# Patient Record
Sex: Female | Born: 1970 | Race: Black or African American | Hispanic: No | Marital: Married | Smoking: Current every day smoker
Health system: Southern US, Community
[De-identification: ages and names within clinical notes are randomized; demographics above are authoritative.]

## PROBLEM LIST (undated history)

## (undated) DIAGNOSIS — I509 Heart failure, unspecified: Secondary | ICD-10-CM

## (undated) DIAGNOSIS — F329 Major depressive disorder, single episode, unspecified: Secondary | ICD-10-CM

## (undated) DIAGNOSIS — J449 Chronic obstructive pulmonary disease, unspecified: Secondary | ICD-10-CM

## (undated) DIAGNOSIS — R51 Headache: Secondary | ICD-10-CM

## (undated) DIAGNOSIS — C801 Malignant (primary) neoplasm, unspecified: Secondary | ICD-10-CM

## (undated) DIAGNOSIS — D649 Anemia, unspecified: Secondary | ICD-10-CM

## (undated) DIAGNOSIS — I10 Essential (primary) hypertension: Secondary | ICD-10-CM

## (undated) DIAGNOSIS — J40 Bronchitis, not specified as acute or chronic: Secondary | ICD-10-CM

## (undated) DIAGNOSIS — R0600 Dyspnea, unspecified: Secondary | ICD-10-CM

## (undated) DIAGNOSIS — R519 Headache, unspecified: Secondary | ICD-10-CM

## (undated) DIAGNOSIS — F32A Depression, unspecified: Secondary | ICD-10-CM

## (undated) DIAGNOSIS — K219 Gastro-esophageal reflux disease without esophagitis: Secondary | ICD-10-CM

## (undated) HISTORY — DX: Major depressive disorder, single episode, unspecified: F32.9

## (undated) HISTORY — PX: BREAST SURGERY: SHX581

## (undated) HISTORY — PX: LEG SURGERY: SHX1003

## (undated) HISTORY — DX: Anemia, unspecified: D64.9

## (undated) HISTORY — DX: Depression, unspecified: F32.A

## (undated) HISTORY — DX: Gastro-esophageal reflux disease without esophagitis: K21.9

---

## 1999-07-26 ENCOUNTER — Inpatient Hospital Stay (HOSPITAL_COMMUNITY): Admission: AD | Admit: 1999-07-26 | Discharge: 1999-07-29 | Payer: Self-pay

## 2001-07-20 ENCOUNTER — Emergency Department (HOSPITAL_COMMUNITY): Admission: EM | Admit: 2001-07-20 | Discharge: 2001-07-20 | Payer: Self-pay | Admitting: Emergency Medicine

## 2001-07-20 ENCOUNTER — Encounter: Payer: Self-pay | Admitting: Emergency Medicine

## 2002-04-15 ENCOUNTER — Encounter: Payer: Self-pay | Admitting: Emergency Medicine

## 2002-04-15 ENCOUNTER — Emergency Department (HOSPITAL_COMMUNITY): Admission: EM | Admit: 2002-04-15 | Discharge: 2002-04-15 | Payer: Self-pay | Admitting: Emergency Medicine

## 2002-08-22 ENCOUNTER — Emergency Department (HOSPITAL_COMMUNITY): Admission: EM | Admit: 2002-08-22 | Discharge: 2002-08-22 | Payer: Self-pay | Admitting: *Deleted

## 2002-08-28 ENCOUNTER — Inpatient Hospital Stay (HOSPITAL_COMMUNITY): Admission: EM | Admit: 2002-08-28 | Discharge: 2002-08-31 | Payer: Self-pay | Admitting: Emergency Medicine

## 2002-08-28 ENCOUNTER — Encounter: Payer: Self-pay | Admitting: Emergency Medicine

## 2002-08-28 ENCOUNTER — Encounter: Payer: Self-pay | Admitting: Orthopaedic Surgery

## 2002-08-29 ENCOUNTER — Encounter: Payer: Self-pay | Admitting: Orthopaedic Surgery

## 2003-02-06 ENCOUNTER — Emergency Department (HOSPITAL_COMMUNITY): Admission: EM | Admit: 2003-02-06 | Discharge: 2003-02-06 | Payer: Self-pay | Admitting: *Deleted

## 2003-02-06 ENCOUNTER — Encounter: Payer: Self-pay | Admitting: *Deleted

## 2003-02-19 ENCOUNTER — Emergency Department (HOSPITAL_COMMUNITY): Admission: EM | Admit: 2003-02-19 | Discharge: 2003-02-19 | Payer: Self-pay | Admitting: Emergency Medicine

## 2004-03-15 ENCOUNTER — Emergency Department (HOSPITAL_COMMUNITY): Admission: EM | Admit: 2004-03-15 | Discharge: 2004-03-15 | Payer: Self-pay | Admitting: Emergency Medicine

## 2004-03-27 ENCOUNTER — Emergency Department (HOSPITAL_COMMUNITY): Admission: EM | Admit: 2004-03-27 | Discharge: 2004-03-27 | Payer: Self-pay | Admitting: Emergency Medicine

## 2004-10-16 ENCOUNTER — Emergency Department (HOSPITAL_COMMUNITY): Admission: EM | Admit: 2004-10-16 | Discharge: 2004-10-16 | Payer: Self-pay | Admitting: Emergency Medicine

## 2004-12-01 ENCOUNTER — Emergency Department (HOSPITAL_COMMUNITY): Admission: EM | Admit: 2004-12-01 | Discharge: 2004-12-01 | Payer: Self-pay | Admitting: Emergency Medicine

## 2006-01-03 ENCOUNTER — Emergency Department (HOSPITAL_COMMUNITY): Admission: EM | Admit: 2006-01-03 | Discharge: 2006-01-03 | Payer: Self-pay | Admitting: Emergency Medicine

## 2006-01-23 ENCOUNTER — Emergency Department (HOSPITAL_COMMUNITY): Admission: EM | Admit: 2006-01-23 | Discharge: 2006-01-23 | Payer: Self-pay | Admitting: Emergency Medicine

## 2006-02-07 ENCOUNTER — Emergency Department (HOSPITAL_COMMUNITY): Admission: EM | Admit: 2006-02-07 | Discharge: 2006-02-07 | Payer: Self-pay | Admitting: Emergency Medicine

## 2008-11-21 ENCOUNTER — Emergency Department (HOSPITAL_COMMUNITY): Admission: EM | Admit: 2008-11-21 | Discharge: 2008-11-21 | Payer: Self-pay | Admitting: Emergency Medicine

## 2009-05-27 ENCOUNTER — Emergency Department (HOSPITAL_COMMUNITY): Admission: EM | Admit: 2009-05-27 | Discharge: 2009-05-27 | Payer: Self-pay | Admitting: Emergency Medicine

## 2009-06-11 ENCOUNTER — Emergency Department (HOSPITAL_COMMUNITY): Admission: EM | Admit: 2009-06-11 | Discharge: 2009-06-12 | Payer: Self-pay | Admitting: Emergency Medicine

## 2009-07-28 ENCOUNTER — Emergency Department (HOSPITAL_COMMUNITY): Admission: EM | Admit: 2009-07-28 | Discharge: 2009-07-28 | Payer: Self-pay | Admitting: Emergency Medicine

## 2009-09-06 ENCOUNTER — Emergency Department (HOSPITAL_COMMUNITY): Admission: EM | Admit: 2009-09-06 | Discharge: 2009-09-06 | Payer: Self-pay | Admitting: Emergency Medicine

## 2010-09-22 ENCOUNTER — Encounter: Payer: Self-pay | Admitting: Emergency Medicine

## 2010-12-12 LAB — RAPID STREP SCREEN (MED CTR MEBANE ONLY): Streptococcus, Group A Screen (Direct): NEGATIVE

## 2011-01-17 NOTE — Discharge Summary (Signed)
   NAME:  Kristin Carson, Kristin Carson                            ACCOUNT NO.:  [id number]   MEDICAL RECORD NO.:  [id number]                   PATIENT TYPE:  INP   LOCATION:  A338                                 FACILITY:  [facility]   PHYSICIAN:  J. Darreld Mclean, M.D.              DATE OF BIRTH:  May 10, 1971   DATE OF ADMISSION:  08/28/2002  DATE OF DISCHARGE:  08/31/2002                                 DISCHARGE SUMMARY   DISCHARGE DIAGNOSIS:  Bimalleolar fracture with the left ankle displaced.   PROCEDURE:  Open treatment and internal fixation of right bimalleolar  fracture.   DISCHARGE MEDICATIONS:  1. Tylox.  2. Keflex 500.   FOLLOW UP:  The patient will be seen in my office on January 12 at 11  o'clock.  The patient is to use crutches or walker, keep her leg off the  ground.  Call me if any difficulty.   BRIEF HISTORY:  The patient fell and sustained the above-mentioned injury  the night before surgery.  She was seen and evaluated.  Risks and  imponderables were discussed.  She underwent the procedure and tolerated it  well.  First day postoperatively, she had a temperature up to 101 but her  vital signs were stable.  Physical therapy was begun.  Respiratory therapy  was begun.  Pain was controlled with a PCA pump.  The patient did very well  with physical therapy.  At that time of discharge, on New Year's Eve, the  patient was afebrile, walking well without any difficulty.  Prescription for  Tylox for pain.  See her in the office as stated.  If any difficulties, she  is to contact the office or hospital beeper system.   LABORATORY DATA:  The patient's lab were within normal limits.  Other  studies were negative.                                               Teola Bradley, M.D.    JWK/MEDQ  D:  10/11/2002  T:  10/11/2002  Job:  [id number]

## 2011-01-17 NOTE — Op Note (Signed)
NAME:  Kristin Carson, Kristin Carson                            ACCOUNT NO.:  [id number]   MEDICAL RECORD NO.:  [id number]                   PATIENT TYPE:  INP   LOCATION:  A338                                 FACILITY:  [facility]   PHYSICIAN:  J. Darreld Mclean, M.D.              DATE OF BIRTH:  1971/02/17   DATE OF PROCEDURE:  DATE OF DISCHARGE:                                 OPERATIVE REPORT   PREOPERATIVE DIAGNOSIS:  Bimalleolar fracture displaced of the ankle on the  right.   POSTOPERATIVE DIAGNOSIS:  Bimalleolar fracture displaced of the ankle on the  right.   OPERATION/PROCEDURE:  Open treatment and internal fixation of right  bimalleolar fracture.   ANESTHESIA:  General.   TOURNIQUET TIME:  23 minutes.   ASSISTANT:  Fransico Michael, P.A.-C.   INDICATIONS FOR PROCEDURE:  The patient is a 40 year old female who fell  yesterday while intoxicated, injuring her right ankle.  Surgery was delayed  until today because of her intoxication.  Her alpha hemoglobin was 8.4.  She  got a unit of blood and hemoglobin is now 10.  The risks and problems were  discussed with the patient yesterday and again today when her blood alcohol  was 0.  The risks and problems included the risk of anesthesia, infection,  the need to be nonweightbearing for a while, need for physical therapy,  crutches.  She appeared to understand.  It was also explained to her about  the possibility of degenerative arthritis or traumatic arthritis over time.  She appeared to understand.   DESCRIPTION OF PROCEDURE:  The patient under general anesthesia while supine  on the operating room table, the tourniquet was placed to inflate on the  right upper thigh.  The patient was prepped and draped in the usual manner.  The leg was elevated with a circumferential Esmarch bandage.  The tourniquet  was inflated to 300 mmHg.  Esmarch exsanguination mode.  To reconfirm we  doing the patient and doing the right ankle.  The incision was made  medially.   The medial malleolus fracture was identified.  It was cleaned of  hematoma and debris and anatomically reduced and held in place with a  Synthes clamp.  A 0.062 smooth Kirschner wire was placed, cut and bent and  then a 3.2 drill bit was used and a 4.5 mm, 55 mm long malleolar screw was  then inserted.  Attention was then directed to the lateral side.  The  incision was made, the fracture was reduced as described on the medial side  and then a 2.0 drill bit was used and then a 3.5 mm, 28 mm long cortical  screw was then inserted for interfragmentary compression.  X-ray was taken,  AP and lateral views and this looked very good.  The fractures were reduced  anatomically bilaterally on both views.  The wounds were then reapproximated  using 2-0 chromic, 2-0  plain and skin staples.  This was done on both sides.  The tourniquet was deflated after 23 minutes.  Sterile dressings  were applied, sheet cotton was applied with ABDs for extra padding, a  posterior splint applied.  The Ace bandage was applied loosely.  The patient  tolerated the procedure well, went to recovery in good condition.  Orders  have been written.  She is admitted overnight.                                               Teola Bradley, M.D.    JWK/MEDQ  D:  08/29/2002  T:  08/29/2002  Job:  [id number]

## 2011-01-17 NOTE — H&P (Signed)
NAME:  Kristin Carson, Kristin Carson                            ACCOUNT NO.:  [id number]   MEDICAL RECORD NO.:  [id number]                   PATIENT TYPE:  INP   LOCATION:  A338                                 FACILITY:  [facility]   PHYSICIAN:  J. Darreld Mclean, M.D.              DATE OF BIRTH:  [birth year]   DATE OF ADMISSION:  08/28/2002  DATE OF DISCHARGE:                                HISTORY & PHYSICAL   CHIEF COMPLAINT:  I fell off a porch and hurt my ankle.   HISTORY OF PRESENT ILLNESS:  The patient fell off of a porch earlier this  morning, injuring her right ankle.  X-rays show a displaced bimalleolar  fracture of the ankle on the right with lateral displacement.  There is no  dislocation.  There are no other known injuries.  The patient's blood  alcohol is 164.  She admits to drinking one or two beers.  No head injury.  No other injuries.   PAST MEDICAL HISTORY:  Denies heart disease, lung disease, kidney disease,  GI disease, and any other fractures.  Denies any significant medical  problems.  Her only previous hospitalization was for the birth of her only  child.   ALLERGIES:  No allergies.   MEDICATIONS:  No medicines.   REVIEW OF SYSTEMS:  The patient has been in very good health.  Dr. Lodema Hong  is her family doctor.   SOCIAL HISTORY:  The patient lives here in [place], [place].   PHYSICAL EXAMINATION:  VITAL SIGNS:  Within normal limits.  HEENT:  Negative.  NECK:  Supple.  LUNGS:  Clear to P&A.  HEART:  Regular rate and rhythm without murmur heard.  ABDOMEN:  Soft and nontender without masses.  EXTREMITIES:  She is in a posterior splint on the right.  Neurovascularly  intact.  Pain and tenderness in the right ankle.  Other extremities were  within normal limits.  CENTRAL NERVOUS SYSTEM:  Intact.  SKIN:  Intact.   IMPRESSION:  1. Bimalleolar fracture, displaced, on the right.  2. Elevated ethanol level, acute.    PLAN:  The patient will need ORIF of the right  ankle.  Will have surgery in  the morning.  She has recently consumed alcohol and the surgery will be  delayed until tomorrow.  The patient has been informed of this.  The patient  will be placed on a PCA morphine pump.  We will do the surgery tomorrow.  The risks and imponderables have been explained to the patient.  She appears  to understand.  I will see her later when her alcohol range is 0.                                               Corliss Parish  Hilda Lias, M.D.    JWK/MEDQ  D:  08/28/2002  T:  08/28/2002  Job:  [id number]

## 2011-05-17 ENCOUNTER — Emergency Department (HOSPITAL_COMMUNITY)
Admission: EM | Admit: 2011-05-17 | Discharge: 2011-05-17 | Disposition: A | Discharge status: Home / Self Care | Payer: Self-pay | Attending: Emergency Medicine | Admitting: Emergency Medicine

## 2011-05-17 ENCOUNTER — Encounter: Payer: Self-pay | Admitting: Emergency Medicine

## 2011-05-17 ENCOUNTER — Emergency Department (HOSPITAL_COMMUNITY): Payer: Self-pay

## 2011-05-17 DIAGNOSIS — S93409A Sprain of unspecified ligament of unspecified ankle, initial encounter: Secondary | ICD-10-CM | POA: Insufficient documentation

## 2011-05-17 DIAGNOSIS — X500XXA Overexertion from strenuous movement or load, initial encounter: Secondary | ICD-10-CM | POA: Insufficient documentation

## 2011-05-17 DIAGNOSIS — IMO0002 Reserved for concepts with insufficient information to code with codable children: Secondary | ICD-10-CM | POA: Insufficient documentation

## 2011-05-17 DIAGNOSIS — F17200 Nicotine dependence, unspecified, uncomplicated: Secondary | ICD-10-CM | POA: Insufficient documentation

## 2011-05-17 DIAGNOSIS — J45909 Unspecified asthma, uncomplicated: Secondary | ICD-10-CM | POA: Insufficient documentation

## 2011-05-17 MED ORDER — HYDROCODONE-ACETAMINOPHEN 5-325 MG PO TABS: ORAL_TABLET | ORAL | Status: DC

## 2011-05-17 MED ORDER — IBUPROFEN 800 MG PO TABS: 800.0000 mg | ORAL_TABLET | Freq: Three times a day (TID) | ORAL | Status: AC

## 2011-05-17 MED ORDER — IBUPROFEN 800 MG PO TABS
800.0000 mg | ORAL_TABLET | Freq: Once | ORAL | Status: AC
  Administered 2011-05-17: 800 mg via ORAL
  Filled 2011-05-17: qty 1

## 2011-05-17 MED ORDER — HYDROCODONE-ACETAMINOPHEN 5-325 MG PO TABS
1.0000 | ORAL_TABLET | Freq: Once | ORAL | Status: AC
  Administered 2011-05-17: 1 via ORAL
  Filled 2011-05-17: qty 1

## 2011-05-17 NOTE — ED Notes (Signed)
Pt drinking alcohol last night and woke up this am with R ankle pain /swelling.Does not know how it happened. Swelling around ankle and bruising noted. Pt able to pivot. Nad.

## 2011-05-17 NOTE — ED Notes (Signed)
Pt out of room and stated she is ready to go and wanted a w/c. Advised pt is next to be seen.

## 2011-05-18 NOTE — ED Provider Notes (Signed)
History     CSN: [id number] Arrival date & time: 05/17/2011 12:55 PM   Chief Complaint  Patient presents with  . Ankle Pain     (Include location/radiation/quality/duration/timing/severity/associated sxs/prior treatment) HPI Comments: Pt states she fell last night, twisted her ankle and her right knee. She c/o pain of both today. Pain with walking or standing. Hx of surg of the right ankle in the past. She is concerned that she may had torn something at the surgical site.  Patient is a 40 y.o. female presenting with ankle pain. The history is provided by the patient.  Ankle Pain  The incident occurred yesterday. The injury mechanism was a fall. The pain is present in the right knee and right ankle. The quality of the pain is described as sharp. The pain is moderate. Pertinent negatives include no numbness, no loss of motion and no loss of sensation. The symptoms are aggravated by bearing weight. She has tried nothing for the symptoms.     Past Medical History  Diagnosis Date  . Asthma      Past Surgical History  Procedure Date  . Leg surgery     History reviewed. No pertinent family history.  History  Substance Use Topics  . Smoking status: Current Everyday Smoker  . Smokeless tobacco: Not on file  . Alcohol Use: Yes     2 40 oz daily    OB History    Grav Para Term Preterm Abortions TAB SAB Ect Mult Living                  Review of Systems  Constitutional: Negative for activity change.       All ROS Neg except as noted in HPI  HENT: Negative for nosebleeds and neck pain.   Eyes: Negative for photophobia and discharge.  Respiratory: Negative for cough, shortness of breath and wheezing.   Cardiovascular: Negative for chest pain and palpitations.  Gastrointestinal: Negative for abdominal pain and blood in stool.  Genitourinary: Negative for dysuria, frequency and hematuria.  Musculoskeletal: Negative for back pain and arthralgias.  Skin: Negative.     Neurological: Negative for dizziness, seizures, speech difficulty and numbness.  Psychiatric/Behavioral: Negative for hallucinations and confusion.    Allergies  Review of patient's allergies indicates no known allergies.  Home Medications   Current Outpatient Rx  Name Route Sig Dispense Refill  . HYDROCODONE-ACETAMINOPHEN 5-325 MG PO TABS  1 po q4h prn pain 10 tablet 0  . IBUPROFEN 800 MG PO TABS Oral Take 1 tablet (800 mg total) by mouth 3 (three) times daily. 21 tablet 0    Physical Exam    BP 113/83  Pulse 93  Temp(Src) 98.3 F (36.8 C) (Oral)  Resp 17  SpO2 100%  LMP 04/19/2011  Physical Exam  Nursing note and vitals reviewed. Constitutional: She is oriented to person, place, and time. She appears well-developed and well-nourished.  Non-toxic appearance.  HENT:  Head: Normocephalic.  Right Ear: Tympanic membrane and external ear normal.  Left Ear: Tympanic membrane and external ear normal.  Eyes: EOM and lids are normal. Pupils are equal, round, and reactive to light.  Neck: Normal range of motion. Neck supple. Carotid bruit is not present.  Cardiovascular: Normal rate, regular rhythm, normal heart sounds, intact distal pulses and normal pulses.   Pulmonary/Chest: Breath sounds normal. No respiratory distress.  Abdominal: Soft. Bowel sounds are normal. There is no tenderness. There is no guarding.  Musculoskeletal: Normal range of motion.  FROM of the right hip. Soreness to palpation of the right knee. Pain with attempted ROM of the right ankle. Distal pulses symmetrical. Good cap refill and sensory.  Lymphadenopathy:       Head (right side): No submandibular adenopathy present.       Head (left side): No submandibular adenopathy present.    She has no cervical adenopathy.  Neurological: She is alert and oriented to person, place, and time. She has normal strength. No cranial nerve deficit or sensory deficit.  Skin: Skin is warm and dry.  Psychiatric: She has  a normal mood and affect. Her speech is normal.    ED Course: Test results given to pt. Pt ambulatory with knee immobilizer with minimal problem. Pain improved after pain meds. Pt to return if any changes or problem.  Procedures  Results for orders placed during the hospital encounter of 06/11/09  RAPID STREP SCREEN      Component Value Range   Streptococcus, Group A Screen (Direct) NEGATIVE  NEGATIVE    Dg Ankle Complete Right  05/17/2011  *RADIOLOGY REPORT*  Clinical Data: Pain.  Ankle swelling for 1 day.  Prior surgery.  No new injury.  RIGHT ANKLE - COMPLETE 3+ VIEW  Comparison: 02/07/2006  Findings: The patient has had prior ORIF of the right ankle. Cortical screws traverse the medial and lateral malleoli.  A pin also traverses the medial malleolus.  A well corticated bone fragment is identified adjacent to the lateral malleolus.  There is no evidence for acute fracture or dislocation.  There is soft tissue swelling along the anterior aspect of the joint.  Small, well-corticated bony densities are identified adjacent to the cuboid.  IMPRESSION:  1.  Postoperative changes. 2. No evidence for acute  abnormality.  Original Report Authenticated By: Patterson Hammersmith, M.D.    Dx: 1. Sprain of ankle, unspecified site   2. Knee sprain   MDM I have reviewed nursing notes, vital signs, and all appropriate lab and imaging results for this patient.       Kathie Dike, PA 05/18/11 1610  Kathie Dike, PA 05/18/11 [phone]

## 2011-05-19 NOTE — ED Provider Notes (Signed)
Medical screening examination/treatment/procedure(s) were performed by non-physician practitioner and as supervising physician I was immediately available for consultation/collaboration.  Donnetta Hutching, MD 05/19/11 1705

## 2012-02-04 ENCOUNTER — Emergency Department (HOSPITAL_COMMUNITY): Payer: Self-pay

## 2012-02-04 ENCOUNTER — Emergency Department (HOSPITAL_COMMUNITY)
Admission: EM | Admit: 2012-02-04 | Discharge: 2012-02-04 | Disposition: A | Discharge status: Home / Self Care | Payer: Self-pay | Attending: Emergency Medicine | Admitting: Emergency Medicine

## 2012-02-04 ENCOUNTER — Encounter (HOSPITAL_COMMUNITY): Payer: Self-pay | Admitting: Oncology

## 2012-02-04 DIAGNOSIS — F17200 Nicotine dependence, unspecified, uncomplicated: Secondary | ICD-10-CM | POA: Insufficient documentation

## 2012-02-04 DIAGNOSIS — Z79899 Other long term (current) drug therapy: Secondary | ICD-10-CM | POA: Insufficient documentation

## 2012-02-04 DIAGNOSIS — J45909 Unspecified asthma, uncomplicated: Secondary | ICD-10-CM | POA: Insufficient documentation

## 2012-02-04 DIAGNOSIS — R079 Chest pain, unspecified: Secondary | ICD-10-CM | POA: Insufficient documentation

## 2012-02-04 LAB — DIFFERENTIAL
Eosinophils Absolute: 0.2 10*3/uL (ref 0.0–0.7)
Lymphocytes Relative: 25 % (ref 12–46)
Lymphs Abs: 1.9 10*3/uL (ref 0.7–4.0)

## 2012-02-04 LAB — CARDIAC PANEL(CRET KIN+CKTOT+MB+TROPI)
CK, MB: 3.6 ng/mL (ref 0.3–4.0)
Relative Index: 1.2 (ref 0.0–2.5)
Troponin I: <0.3 ng/mL (ref <0.30)

## 2012-02-04 LAB — CBC
Hemoglobin: 12.7 g/dL (ref 12.0–15.0)
MCH: 28 pg (ref 26.0–34.0)
MCHC: 33.3 g/dL (ref 30.0–36.0)
MCV: 84.1 fL (ref 78.0–100.0)

## 2012-02-04 LAB — BASIC METABOLIC PANEL
BUN: 16 mg/dL (ref 6–23)
CO2: 21 mEq/L (ref 19–32)
Chloride: 103 mEq/L (ref 96–112)
Creatinine, Ser: 0.97 mg/dL (ref 0.50–1.10)
GFR calc non Af Amer: 73 mL/min — ABNORMAL LOW (ref >90)
Potassium: 4.3 mEq/L (ref 3.5–5.1)

## 2012-02-04 MED ORDER — CYCLOBENZAPRINE HCL 10 MG PO TABS: 10.0000 mg | ORAL_TABLET | Freq: Two times a day (BID) | ORAL | Status: AC | PRN

## 2012-02-04 MED ORDER — NAPROXEN 375 MG PO TABS: 375.0000 mg | ORAL_TABLET | Freq: Two times a day (BID) | ORAL | Status: DC

## 2012-02-04 MED ORDER — KETOROLAC TROMETHAMINE 60 MG/2ML IM SOLN
60.0000 mg | Freq: Once | INTRAMUSCULAR | Status: AC
  Administered 2012-02-04: 60 mg via INTRAMUSCULAR
  Filled 2012-02-04: qty 2

## 2012-02-04 NOTE — ED Notes (Signed)
Kristin Carson reports chest pain, cough, ShOB onset last night.  CP worsens when laying flat.

## 2012-02-04 NOTE — Discharge Instructions (Signed)
Tests were normal. Medication for pain and muscle relaxers. Followup your primary care Dr.

## 2012-02-04 NOTE — ED Provider Notes (Signed)
History    This chart was scribed for Donnetta Hutching, MD, MD by Smitty Pluck. The patient was seen in room APA19 and the patient's care was started at 7:35AM.   CSN: [id number]  Arrival date & time 02/04/12  0704   First MD Initiated Contact with Patient 02/04/12 [phone]      No chief complaint on file.   (Consider location/radiation/quality/duration/timing/severity/associated sxs/prior treatment) The history is provided by the patient.   Kristin Carson is a 41 y.o. female who presents to the Emergency Department complaining of moderate waxing and waning left side chest pain onset this AM around 1.5 hours ago. Pt reports that it last 2-3 seconds. Pain is aggravated by laying down. Pt reports the pain is sharp. Pt reports being a smoker 0.5 packs/ day. Family hx of heart problems (grandmother). Pt reports mild diaphoresis. Pt reports having SOB. Denies abdominal pain, nausea and vomiting.   Past Medical History  Diagnosis Date  . Asthma     Past Surgical History  Procedure Date  . Leg surgery     No family history on file.  History  Substance Use Topics  . Smoking status: Current Everyday Smoker  . Smokeless tobacco: Not on file  . Alcohol Use: Yes     2 40 oz daily    OB History    Grav Para Term Preterm Abortions TAB SAB Ect Mult Living                  Review of Systems  All other systems reviewed and are negative.   10 Systems reviewed and all are negative for acute change except as noted in the HPI.   Allergies  Review of patient's allergies indicates no known allergies.  Home Medications   Current Outpatient Rx  Name Route Sig Dispense Refill  . HYDROCODONE-ACETAMINOPHEN 5-325 MG PO TABS  1 po q4h prn pain 10 tablet 0    BP 158/81  Pulse 78  Temp(Src) 98.7 F (37.1 C) (Oral)  Resp 20  Ht 5\' 6"  (1.676 m)  Wt 180 lb (81.647 kg)  BMI 29.05 kg/m2  SpO2 100%  LMP 01/28/2012  Physical Exam  Nursing note and vitals reviewed. Constitutional: She is  oriented to person, place, and time. She appears well-developed and well-nourished. No distress.  HENT:  Head: Normocephalic and atraumatic.  Neck: Normal range of motion. Neck supple.  Cardiovascular: Normal rate and regular rhythm.   Pulmonary/Chest: Effort normal and breath sounds normal. No respiratory distress. She exhibits tenderness (tender to palpation of chest wall).  Abdominal: Soft. She exhibits no distension.  Neurological: She is alert and oriented to person, place, and time.  Skin: Skin is warm and dry.  Psychiatric: She has a normal mood and affect.    ED Course  Procedures (including critical care time) DIAGNOSTIC STUDIES: Oxygen Saturation is 100% on room air, normal by my interpretation.    COORDINATION OF CARE: 7:38AM EDP discusses pt ED treatment with pt. Pt will have EKG, chest xray, IM Toradol for pain  and blood labs. 9:41AM EDP orders medication: Toradol 60 mg   Labs Reviewed  CBC - Abnormal; Notable for the following:    RDW 15.8 (*)    All other components within normal limits  BASIC METABOLIC PANEL - Abnormal; Notable for the following:    GFR calc non Af Amer 73 (*)    GFR calc Af Amer 84 (*)    All other components within normal limits  CARDIAC PANEL(CRET  KIN+CKTOT+MB+TROPI) - Abnormal; Notable for the following:    Total CK 304 (*)    All other components within normal limits  DIFFERENTIAL   Dg Chest 2 View  02/04/2012  *RADIOLOGY REPORT*  Clinical Data: Chest pain, bronchitis, smoking history  CHEST - 2 VIEW  Comparison: Chest x-ray of 06/11/2009  Findings: No active infiltrate or effusion is seen.  Mediastinal contours are stable.  The heart is within normal limits in size. No bony abnormality is seen.  IMPRESSION: No active lung disease.  Original Report Authenticated By: Juline Patch, M.D.     No diagnosis found.  Date: 02/04/2012  Rate:61  Rhythm: normal sinus rhythm  QRS Axis: normal  Intervals: normal  ST/T Wave abnormalities:  normal  Conduction Disutrbances:none  Narrative Interpretation:   Old EKG Reviewed: none available c SA   MDM  Atypical chest pain.  Does not appear to be cardiac. IM Toradol given which seemed to help pain.  Discharge home with Naprosyn and Flexeril    I personally performed the services described in this documentation, which was scribed in my presence. The recorded information has been reviewed and considered.    Donnetta Hutching, MD 02/04/12 [phone]

## 2012-02-04 NOTE — ED Notes (Signed)
edp to see pt 

## 2012-02-04 NOTE — ED Notes (Signed)
Pt reports that she began having cp and sob, that started last night, appears in nad at arrival, able to speak clearly and answer all ?'s, no radiation of pain. Sensation is worse when laying flat.   Awaiting chest xray family at bedside. Pt drinking cup of water.

## 2012-04-20 ENCOUNTER — Emergency Department (HOSPITAL_COMMUNITY)
Admission: EM | Admit: 2012-04-20 | Discharge: 2012-04-21 | Disposition: A | Discharge status: Home / Self Care | Payer: Self-pay | Attending: Emergency Medicine | Admitting: Emergency Medicine

## 2012-04-20 ENCOUNTER — Encounter (HOSPITAL_COMMUNITY): Payer: Self-pay

## 2012-04-20 DIAGNOSIS — T7840XA Allergy, unspecified, initial encounter: Secondary | ICD-10-CM

## 2012-04-20 DIAGNOSIS — I10 Essential (primary) hypertension: Secondary | ICD-10-CM | POA: Insufficient documentation

## 2012-04-20 DIAGNOSIS — J45909 Unspecified asthma, uncomplicated: Secondary | ICD-10-CM | POA: Insufficient documentation

## 2012-04-20 DIAGNOSIS — T363X5A Adverse effect of macrolides, initial encounter: Secondary | ICD-10-CM | POA: Insufficient documentation

## 2012-04-20 DIAGNOSIS — Z888 Allergy status to other drugs, medicaments and biological substances status: Secondary | ICD-10-CM | POA: Insufficient documentation

## 2012-04-20 DIAGNOSIS — F17200 Nicotine dependence, unspecified, uncomplicated: Secondary | ICD-10-CM | POA: Insufficient documentation

## 2012-04-20 HISTORY — DX: Essential (primary) hypertension: I10

## 2012-04-20 NOTE — ED Provider Notes (Signed)
History  This chart was scribed for [employer]. Colon Branch, MD by Bennett Scrape. This patient was seen in room APA03/APA03 and the patient's care was started at 11:58PM.  CSN: [id number]  Arrival date & time 04/20/12  2138   First MD Initiated Contact with Patient 04/20/12 2358      Chief Complaint  Patient presents with  . Allergic Reaction    The history is provided by the patient. No language interpreter was used.    Kristin Carson is a 41 y.o. female who presents to the Emergency Department complaining of an allergic reaction to Azithromycin approximately 45 minutes PTA. Pt states that she [facility] when she started to feel SOB, developed a HA and noticed a rash around both her eyes. She denies having any trouble swallowing or breathing. She denies taking OTC medications at home to improve symptoms PTA. She denies having prior episodes of similar symptoms. She denies fever,sore throat, visual disturbance, CP, cough, abdominal pain, urinary symptoms, and back pain as associated symptoms. She has a h/o asthma and HTN. She is a current everyday smoker and occasional alcohol user.     Past Medical History  Diagnosis Date  . Asthma   . Hypertension     Past Surgical History  Procedure Date  . Leg surgery     No family history on file.  History  Substance Use Topics  . Smoking status: Current Everyday Smoker  . Smokeless tobacco: Not on file  . Alcohol Use: Yes     2 40 oz daily    No OB history provided.  Review of Systems  Constitutional: Negative for fever.       10 Systems reviewed and are negative for acute change except as noted in the HPI.  HENT: Negative for congestion.        Facial swelling  Eyes: Negative for discharge and redness.  Respiratory: Negative for cough and shortness of breath.   Cardiovascular: Negative for chest pain.  Gastrointestinal: Negative for vomiting and abdominal pain.    Musculoskeletal: Negative for back pain.  Skin: Negative for rash.       itching  Neurological: Negative for syncope, numbness and headaches.  Psychiatric/Behavioral:       No behavior change.      Allergies  Review of patient's allergies indicates no known allergies.  Home Medications  No current outpatient prescriptions on file.  Triage Vitals: BP 170/99  Pulse 92  Temp 97.3 F (36.3 C) (Oral)  Resp 22  Ht 5\' 6"  (1.676 m)  Wt 160 lb (72.576 kg)  BMI 25.82 kg/m2  SpO2 100%  LMP 04/13/2012  Physical Exam  Nursing note and vitals reviewed. Constitutional: She is oriented to person, place, and time. She appears well-developed and well-nourished. No distress.  HENT:  Head: Normocephalic and atraumatic.  Mouth/Throat: Oropharynx is clear and moist.       Uvula is midline, pallet elevates appropriately   Eyes: Conjunctivae and EOM are normal.  Neck: Neck supple. No tracheal deviation present.  Cardiovascular: Normal rate and regular rhythm.  Exam reveals no gallop and no friction rub.   No murmur heard. Pulmonary/Chest: Effort normal and breath sounds normal. No respiratory distress. She has no wheezes. She has no rales.  Musculoskeletal: Normal range of motion. She exhibits no edema.  Neurological: She is alert and oriented to person, place, and time.  Skin: Skin is warm and dry.  Hive-like reaction around bilateral periorbitals, no rash noted any where else   Psychiatric: She has a normal mood and affect. Her behavior is normal.    ED Course  Procedures (including critical care time)  DIAGNOSTIC STUDIES: Oxygen Saturation is 100% on room air, normal by my interpretation.    COORDINATION OF CARE: 12:00AM-Discussed treatment plan which includes Pepcid, Benadryl and prednisone with pt at bedside and pt agreed to plan.     No diagnosis found.    MDM  Patient with allergic reaction to azithromycin manifested by hives and facial swelling.Given prednisone,  benadryl and pepcid. Pt stable in ED with no significant deterioration in condition.The patient appears reasonably screened and/or stabilized for discharge and I doubt any other medical condition or other [facility] requiring further screening, evaluation, or treatment in the ED at this time prior to discharge.  I personally performed the services described in this documentation, which was scribed in my presence. The recorded information has been reviewed and considered.   MDM Reviewed: nursing note and vitals           Nicoletta Dress. Colon Branch, MD 04/21/12 1610

## 2012-04-20 NOTE — ED Notes (Signed)
Pt c/o SOB, unable to swallow, and headache after taking antibiotic for [facility]lam[facility] that [facility]. Symptoms started ago. Pills were taken this morning

## 2012-04-21 ENCOUNTER — Encounter (HOSPITAL_COMMUNITY): Payer: Self-pay | Admitting: Emergency Medicine

## 2012-04-21 ENCOUNTER — Emergency Department (HOSPITAL_COMMUNITY)
Admission: EM | Admit: 2012-04-21 | Discharge: 2012-04-22 | Disposition: A | Discharge status: Home / Self Care | Payer: Self-pay | Attending: Emergency Medicine | Admitting: Emergency Medicine

## 2012-04-21 DIAGNOSIS — R51 Headache: Secondary | ICD-10-CM | POA: Insufficient documentation

## 2012-04-21 DIAGNOSIS — I10 Essential (primary) hypertension: Secondary | ICD-10-CM | POA: Insufficient documentation

## 2012-04-21 DIAGNOSIS — F17200 Nicotine dependence, unspecified, uncomplicated: Secondary | ICD-10-CM | POA: Insufficient documentation

## 2012-04-21 DIAGNOSIS — R1310 Dysphagia, unspecified: Secondary | ICD-10-CM | POA: Insufficient documentation

## 2012-04-21 DIAGNOSIS — Z881 Allergy status to other antibiotic agents status: Secondary | ICD-10-CM | POA: Insufficient documentation

## 2012-04-21 MED ORDER — FAMOTIDINE 20 MG PO TABS
20.0000 mg | ORAL_TABLET | Freq: Once | ORAL | Status: AC
  Administered 2012-04-21: 20 mg via ORAL
  Filled 2012-04-21: qty 1

## 2012-04-21 MED ORDER — PREDNISONE 10 MG PO TABS: 20.0000 mg | ORAL_TABLET | Freq: Every day | ORAL | Status: DC

## 2012-04-21 MED ORDER — DEXAMETHASONE SODIUM PHOSPHATE 4 MG/ML IJ SOLN
10.0000 mg | Freq: Once | INTRAMUSCULAR | Status: AC
  Administered 2012-04-21: 10 mg via INTRAMUSCULAR
  Filled 2012-04-21: qty 3

## 2012-04-21 MED ORDER — DIPHENHYDRAMINE HCL 25 MG PO CAPS
25.0000 mg | ORAL_CAPSULE | Freq: Once | ORAL | Status: AC
  Administered 2012-04-21: 25 mg via ORAL
  Filled 2012-04-21: qty 1

## 2012-04-21 MED ORDER — HYDROCODONE-ACETAMINOPHEN 5-325 MG PO TABS
1.0000 | ORAL_TABLET | Freq: Once | ORAL | Status: AC
  Administered 2012-04-21: 1 via ORAL
  Filled 2012-04-21: qty 1

## 2012-04-21 MED ORDER — PREDNISONE 20 MG PO TABS
60.0000 mg | ORAL_TABLET | Freq: Once | ORAL | Status: AC
  Administered 2012-04-21: 60 mg via ORAL
  Filled 2012-04-21: qty 3

## 2012-04-21 MED ORDER — METOCLOPRAMIDE HCL 5 MG/ML IJ SOLN
10.0000 mg | Freq: Once | INTRAMUSCULAR | Status: AC
  Administered 2012-04-21: 10 mg via INTRAMUSCULAR
  Filled 2012-04-21: qty 2

## 2012-04-21 NOTE — ED Notes (Signed)
Patient complaining of difficulty swallowing since yesterday. Was seen here yesterday for same and allergic reaction. States "they told me yesterday that it was just part of the allergic reaction but it's still there. It feels like something is stuck in my throat."

## 2012-04-21 NOTE — ED Provider Notes (Signed)
History     CSN: [id number]  Arrival date & time 04/21/12  2050   First MD Initiated Contact with Patient 04/21/12 2217      Chief Complaint  Patient presents with  . Dysphagia    (Consider location/radiation/quality/duration/timing/severity/associated sxs/prior treatment) HPI Comments: Patient c/o difficulty swallowing since yesterday.  States that she was treated at the local health dept yesterday with Zithromax for a Chlamydia infection and later developed, itching, facial swelling and difficulty swallowing.  She was treated here for an allergic reaction and given a prescription for prednisone tablets which she began taking this afternoon but returns to ED tonight with persistent foreign body sensation to her throat and she also c/o frontal headache that she states also began yesterday.  She describes the headache as throbbing and gradual onset.  She denies visual changes, fever, vomiting, neck pain or stiffness, or facial swelling.    She also denies previous allergic reactions  The history is provided by the patient.    Past Medical History  Diagnosis Date  . Asthma   . Hypertension     Past Surgical History  Procedure Date  . Leg surgery     History reviewed. No pertinent family history.  History  Substance Use Topics  . Smoking status: Current Everyday Smoker  . Smokeless tobacco: Not on file  . Alcohol Use: Yes     2 40 oz daily    OB History    Grav Para Term Preterm Abortions TAB SAB Ect Mult Living                  Review of Systems  Constitutional: Negative for fever, activity change and appetite change.  HENT: Positive for trouble swallowing. Negative for facial swelling, drooling, mouth sores, neck pain, neck stiffness and voice change.   Eyes: Positive for photophobia. Negative for pain and visual disturbance.  Respiratory: Negative for shortness of breath and stridor.   Cardiovascular: Negative for chest pain.  Gastrointestinal: Negative for  nausea and vomiting.  Skin: Negative for rash and wound.  Neurological: Positive for headaches. Negative for dizziness, syncope, facial asymmetry, speech difficulty, weakness, light-headedness and numbness.  Psychiatric/Behavioral: Negative for confusion and decreased concentration.  All other systems reviewed and are negative.    Allergies  Erythromycin  Home Medications   Current Outpatient Rx  Name Route Sig Dispense Refill  . ACETAMINOPHEN 500 MG PO TABS Oral Take 500 mg by mouth as needed.    Marland Kitchen DIPHENHYDRAMINE HCL (SLEEP) 25 MG PO TABS Oral Take 25 mg by mouth as needed.    Marland Kitchen PEPCID PO Oral Take 1 tablet by mouth as needed.    Marland Kitchen PREDNISONE 10 MG PO TABS Oral Take 2 tablets (20 mg total) by mouth daily. 10 tablet 0    BP 169/103  Pulse 86  Temp 98.3 F (36.8 C) (Oral)  Resp 16  Ht 5\' 6"  (1.676 m)  Wt 165 lb (74.844 kg)  BMI 26.63 kg/m2  SpO2 100%  LMP 04/13/2012  Physical Exam  Nursing note and vitals reviewed. Constitutional: She is oriented to person, place, and time. She appears well-developed and well-nourished. No distress.  HENT:  Head: Normocephalic and atraumatic. No trismus in the jaw.  Right Ear: Tympanic membrane and ear canal normal.  Left Ear: Tympanic membrane and ear canal normal.  Mouth/Throat: Uvula is midline, oropharynx is clear and moist and mucous membranes are normal. No uvula swelling. No oropharyngeal exudate, posterior oropharyngeal edema, posterior oropharyngeal erythema or tonsillar  abscesses.  Eyes: EOM are normal. Pupils are equal, round, and reactive to light.  Neck: Normal range of motion and phonation normal. Neck supple. No rigidity. No Brudzinski's sign and no Kernig's sign noted.  Cardiovascular: Normal rate, regular rhythm, normal heart sounds and intact distal pulses.   No murmur heard. Pulmonary/Chest: Effort normal and breath sounds normal. She has no wheezes.  Musculoskeletal: Normal range of motion. She exhibits no edema.    Lymphadenopathy:    She has no cervical adenopathy.  Neurological: She is alert and oriented to person, place, and time. No cranial nerve deficit or sensory deficit. She exhibits normal muscle tone. Coordination and gait normal.  Reflex Scores:      Tricep reflexes are 2+ on the right side and 2+ on the left side.      Bicep reflexes are 2+ on the right side and 2+ on the left side. Skin: Skin is warm and dry.    ED Course  Procedures (including critical care time)  Labs Reviewed - No data to display      MDM    Previous ed chart reviewed by me.   Pt is well appearing, NAD.  Airway is patent.  Uvula is midline w/o edema.  Pt able to drink fluids w/o difficulty or vomiting.   No focal neuro deficits, no meningeal signs.  Ambulates with a steady gait   Patient also requested to eat, brought [employer] food with her.  Ate w/o difficulty.  States she is feeling better.  Currently taking prednisone , advised to continue as directed.    The patient appears reasonably screened and/or stabilized for discharge and I doubt any other medical condition or other [facility] requiring further screening, evaluation, or treatment in the ED at this time prior to discharge.     Brok Stocking L. Nirel Babler, PA 04/22/12 0003  Mayan Kloepfer L. Makeisha Jentsch, [place] 04/22/12 0005

## 2012-04-21 NOTE — ED Notes (Signed)
Pt tolerating fluids without problems; no distress noted; Pt wanting to eat solids, discussed with PA Per PA ok for pt to eat; pt has food in room

## 2012-04-21 NOTE — ED Notes (Signed)
Pt tolerated solid food with out any problems or distress noted

## 2012-04-22 NOTE — ED Notes (Signed)
Pt stable at discharge with ambulatory gait; pt denies pain; pt instructed to return back if needed; verbalized understanding. Pt tolerating po's without any sx of distress assessed

## 2012-04-22 NOTE — ED Provider Notes (Signed)
Medical screening examination/treatment/procedure(s) were conducted as a shared visit with non-physician practitioner(s) and myself.  I personally evaluated the patient during the encounter.  Normal airway. Patient is swallowing food. No respiratory or gastrointestinal distress  Donnetta Hutching, MD 04/22/12 [phone]

## 2012-04-24 ENCOUNTER — Encounter (HOSPITAL_COMMUNITY): Payer: Self-pay | Admitting: *Deleted

## 2012-04-24 ENCOUNTER — Emergency Department (HOSPITAL_COMMUNITY): Payer: Self-pay

## 2012-04-24 ENCOUNTER — Emergency Department (HOSPITAL_COMMUNITY)
Admission: EM | Admit: 2012-04-24 | Discharge: 2012-04-25 | Disposition: A | Discharge status: Home / Self Care | Payer: Self-pay | Attending: Emergency Medicine | Admitting: Emergency Medicine

## 2012-04-24 DIAGNOSIS — R51 Headache: Secondary | ICD-10-CM | POA: Insufficient documentation

## 2012-04-24 DIAGNOSIS — F419 Anxiety disorder, unspecified: Secondary | ICD-10-CM

## 2012-04-24 DIAGNOSIS — F411 Generalized anxiety disorder: Secondary | ICD-10-CM | POA: Insufficient documentation

## 2012-04-24 DIAGNOSIS — F17200 Nicotine dependence, unspecified, uncomplicated: Secondary | ICD-10-CM | POA: Insufficient documentation

## 2012-04-24 DIAGNOSIS — R42 Dizziness and giddiness: Secondary | ICD-10-CM | POA: Insufficient documentation

## 2012-04-24 LAB — PREGNANCY, URINE: Preg Test, Ur: NEGATIVE

## 2012-04-24 LAB — URINALYSIS, ROUTINE W REFLEX MICROSCOPIC
Bilirubin Urine: NEGATIVE
Leukocytes, UA: NEGATIVE
Nitrite: NEGATIVE
Specific Gravity, Urine: 1.025 (ref 1.005–1.030)
pH: 6 (ref 5.0–8.0)

## 2012-04-24 NOTE — ED Provider Notes (Signed)
History     CSN: [id number]  Arrival date & time 04/24/12  2205   First MD Initiated Contact with Patient 04/24/12 2240      Chief Complaint  Patient presents with  . Shortness of Breath  . Panic Attack  . Dizziness  . Headache    (Consider location/radiation/quality/duration/timing/severity/associated sxs/prior treatment) HPI Comments: Patient states she was playing children in the home when she began to have a sensation of feeling dizzy and slightly lightheaded. She went to set on the couch and after that she became very short of breath and then very nervous and anxious. She states she felt warm all over as though she might pass out. She did not lose consciousness. The patient was brought to the emergency department by EMS. Patient states she's had some feelings of dizziness and lightheadedness over the last week. Tonight's episode was worse than usual. The patient also states that she had a medication that she was taking and she does not remember the name of that caused her have an adverse reaction, and since that time it seems as though the lightheadedness and dizziness has been worse. Patient denies any recent injury or accident in particularly no head injury. There's been no palpitations or chest pain.  Patient is a 41 y.o. female presenting with shortness of breath and headaches. The history is provided by the patient.  Shortness of Breath  Associated symptoms include shortness of breath and wheezing. Pertinent negatives include no chest pain and no cough.  Headache  Associated symptoms include shortness of breath. Pertinent negatives include no palpitations.    Past Medical History  Diagnosis Date  . Asthma   . Hypertension     Past Surgical History  Procedure Date  . Leg surgery     History reviewed. No pertinent family history.  History  Substance Use Topics  . Smoking status: Current Everyday Smoker  . Smokeless tobacco: Not on file  . Alcohol Use: Yes     2 40  oz daily    OB History    Grav Para Term Preterm Abortions TAB SAB Ect Mult Living                  Review of Systems  Constitutional: Negative for activity change.       All ROS Neg except as noted in HPI  HENT: Negative for nosebleeds and neck pain.   Eyes: Negative for photophobia and discharge.  Respiratory: Positive for shortness of breath and wheezing. Negative for cough.   Cardiovascular: Negative for chest pain and palpitations.  Gastrointestinal: Negative for abdominal pain and blood in stool.  Genitourinary: Negative for dysuria, frequency and hematuria.  Musculoskeletal: Negative for back pain and arthralgias.  Skin: Negative.   Neurological: Positive for headaches. Negative for dizziness, seizures and speech difficulty.  Psychiatric/Behavioral: Negative for hallucinations and confusion.    Allergies  Erythromycin  Home Medications   Current Outpatient Rx  Name Route Sig Dispense Refill  . ACETAMINOPHEN 500 MG PO TABS Oral Take 500 mg by mouth as needed. pain    . METRONIDAZOLE 500 MG PO TABS Oral Take 500 mg by mouth 2 (two) times daily. Started on 04/20/2012 taking for 7 days    . PREDNISONE 10 MG PO TABS Oral Take 2 tablets (20 mg total) by mouth daily. 10 tablet 0  . DIPHENHYDRAMINE HCL (SLEEP) 25 MG PO TABS Oral Take 25 mg by mouth as needed.    Marland Kitchen PEPCID PO Oral Take 1 tablet  by mouth as needed. indegest      BP 158/92  Pulse 71  Temp 98.5 F (36.9 C)  Resp 20  SpO2 100%  LMP 04/13/2012  Physical Exam  Nursing note and vitals reviewed. Constitutional: She is oriented to person, place, and time. She appears well-developed and well-nourished.  Non-toxic appearance.  HENT:  Head: Normocephalic.  Right Ear: Tympanic membrane and external ear normal.  Left Ear: Tympanic membrane and external ear normal.  Eyes: EOM and lids are normal. Pupils are equal, round, and reactive to light.  Neck: Normal range of motion. Neck supple. Carotid bruit is not  present.  Cardiovascular: Normal rate, regular rhythm, normal heart sounds, intact distal pulses and normal pulses.  Exam reveals no gallop and no friction rub.   No murmur heard. Pulmonary/Chest: Breath sounds normal. No respiratory distress. She has no wheezes.  Abdominal: Soft. Bowel sounds are normal. There is no tenderness. There is no guarding.  Musculoskeletal: Normal range of motion.  Lymphadenopathy:       Head (right side): No submandibular adenopathy present.       Head (left side): No submandibular adenopathy present.    She has no cervical adenopathy.  Neurological: She is alert and oriented to person, place, and time. She has normal strength. No cranial nerve deficit or sensory deficit. She exhibits normal muscle tone. Coordination normal.  Skin: Skin is warm and dry.  Psychiatric: Her speech is normal. Her mood appears anxious.    ED Course  Procedures (including critical care time)  Labs Reviewed - No data to display No results found. EKG 23:24 - rate-62. Rhythm-normal sinus rhythm. PR-within normal limits. QRS axis-normal. ST-within normal limits.No STEMI. No life-threatening arrhythmias.  No diagnosis found.    MDM  I have reviewed nursing notes, vital signs, and all appropriate lab and imaging results for this patient. Urine pregnancy test is negative. Urinalysis is negative for acute infection or problem. Chest xray reveals heart mildly enlarged. There is bibasilar opacities and the possibility of atelectasis vs infiltrate was suggested. Pulse ox 100%. Temp 98.5. No tachypnea. Doubt infiltrate. Suspect this event was anxiety related. Pt to see her MD for follow up and recheck. She feels better after the ativan.       Kathie Dike, [place] 04/25/12 [phone]

## 2012-04-24 NOTE — ED Notes (Signed)
Pt sitting on couch and began filling sob. Pt then began to panic and started feeling dizzy and light headed.

## 2012-04-25 MED ORDER — LORAZEPAM 1 MG PO TABS
1.0000 mg | ORAL_TABLET | Freq: Once | ORAL | Status: AC
  Administered 2012-04-25: 1 mg via ORAL
  Filled 2012-04-25: qty 1

## 2012-04-26 NOTE — ED Provider Notes (Signed)
Medical screening examination/treatment/procedure(s) were performed by non-physician practitioner and as supervising physician I was immediately available for consultation/collaboration.   Glynn Octave, MD 04/26/12 1144

## 2012-06-01 ENCOUNTER — Encounter (HOSPITAL_COMMUNITY): Payer: Self-pay

## 2012-06-01 ENCOUNTER — Emergency Department (HOSPITAL_COMMUNITY)
Admission: EM | Admit: 2012-06-01 | Discharge: 2012-06-01 | Disposition: A | Discharge status: Home / Self Care | Payer: Self-pay | Attending: Emergency Medicine | Admitting: Emergency Medicine

## 2012-06-01 ENCOUNTER — Emergency Department (HOSPITAL_COMMUNITY): Payer: Self-pay

## 2012-06-01 DIAGNOSIS — J45909 Unspecified asthma, uncomplicated: Secondary | ICD-10-CM | POA: Insufficient documentation

## 2012-06-01 DIAGNOSIS — J40 Bronchitis, not specified as acute or chronic: Secondary | ICD-10-CM | POA: Insufficient documentation

## 2012-06-01 DIAGNOSIS — R350 Frequency of micturition: Secondary | ICD-10-CM | POA: Insufficient documentation

## 2012-06-01 DIAGNOSIS — R05 Cough: Secondary | ICD-10-CM | POA: Insufficient documentation

## 2012-06-01 DIAGNOSIS — F17200 Nicotine dependence, unspecified, uncomplicated: Secondary | ICD-10-CM | POA: Insufficient documentation

## 2012-06-01 DIAGNOSIS — R059 Cough, unspecified: Secondary | ICD-10-CM | POA: Insufficient documentation

## 2012-06-01 DIAGNOSIS — R070 Pain in throat: Secondary | ICD-10-CM | POA: Insufficient documentation

## 2012-06-01 DIAGNOSIS — R197 Diarrhea, unspecified: Secondary | ICD-10-CM | POA: Insufficient documentation

## 2012-06-01 DIAGNOSIS — I10 Essential (primary) hypertension: Secondary | ICD-10-CM | POA: Insufficient documentation

## 2012-06-01 DIAGNOSIS — R112 Nausea with vomiting, unspecified: Secondary | ICD-10-CM | POA: Insufficient documentation

## 2012-06-01 DIAGNOSIS — N390 Urinary tract infection, site not specified: Secondary | ICD-10-CM | POA: Insufficient documentation

## 2012-06-01 LAB — RAPID STREP SCREEN (MED CTR MEBANE ONLY): Streptococcus, Group A Screen (Direct): NEGATIVE

## 2012-06-01 LAB — URINALYSIS, ROUTINE W REFLEX MICROSCOPIC
Ketones, ur: NEGATIVE mg/dL
Nitrite: POSITIVE — AB
Protein, ur: 30 mg/dL — AB
Urobilinogen, UA: 1 mg/dL (ref 0.0–1.0)

## 2012-06-01 MED ORDER — ALBUTEROL SULFATE (5 MG/ML) 0.5% IN NEBU
INHALATION_SOLUTION | RESPIRATORY_TRACT | Status: AC
  Filled 2012-06-01: qty 0.5

## 2012-06-01 MED ORDER — IPRATROPIUM BROMIDE 0.02 % IN SOLN
0.5000 mg | Freq: Once | RESPIRATORY_TRACT | Status: AC
  Administered 2012-06-01: 0.5 mg via RESPIRATORY_TRACT
  Filled 2012-06-01: qty 2.5

## 2012-06-01 MED ORDER — GUAIFENESIN-CODEINE 100-10 MG/5ML PO SYRP: 10.0000 mL | ORAL_SOLUTION | Freq: Three times a day (TID) | ORAL | Status: AC | PRN

## 2012-06-01 MED ORDER — CEPHALEXIN 500 MG PO CAPS: 500.0000 mg | ORAL_CAPSULE | Freq: Four times a day (QID) | ORAL | Status: DC

## 2012-06-01 MED ORDER — HYDROCODONE-ACETAMINOPHEN 5-325 MG PO TABS
1.0000 | ORAL_TABLET | Freq: Once | ORAL | Status: AC
  Administered 2012-06-01: 1 via ORAL
  Filled 2012-06-01: qty 1

## 2012-06-01 MED ORDER — IBUPROFEN 800 MG PO TABS
800.0000 mg | ORAL_TABLET | Freq: Once | ORAL | Status: AC
  Administered 2012-06-01: 800 mg via ORAL
  Filled 2012-06-01: qty 1

## 2012-06-01 MED ORDER — IBUPROFEN 800 MG PO TABS: 800.0000 mg | ORAL_TABLET | Freq: Three times a day (TID) | ORAL | Status: DC

## 2012-06-01 MED ORDER — ALBUTEROL SULFATE HFA 108 (90 BASE) MCG/ACT IN AERS
2.0000 | INHALATION_SPRAY | Freq: Once | RESPIRATORY_TRACT | Status: AC
  Administered 2012-06-01: 2 via RESPIRATORY_TRACT
  Filled 2012-06-01: qty 6.7

## 2012-06-01 MED ORDER — ALBUTEROL SULFATE (5 MG/ML) 0.5% IN NEBU
5.0000 mg | INHALATION_SOLUTION | Freq: Once | RESPIRATORY_TRACT | Status: AC
  Administered 2012-06-01: 5 mg via RESPIRATORY_TRACT
  Filled 2012-06-01: qty 0.5

## 2012-06-01 NOTE — ED Notes (Signed)
Pt reports sore throat, cough, nausea and vomiting that began yesterday

## 2012-06-01 NOTE — ED Notes (Signed)
Awaiting inhaler treatment/teaching.  prior to discharge

## 2012-06-01 NOTE — ED Provider Notes (Signed)
History     CSN: [id number]  Arrival date & time 06/01/12  0804   First MD Initiated Contact with Patient 06/01/12 [phone]      Chief Complaint  Patient presents with  . Sore Throat  . Cough  . Emesis  . Diarrhea    (Consider location/radiation/quality/duration/timing/severity/associated sxs/prior treatment) Patient is a 41 y.o. female presenting with URI. The history is provided by the patient.  URI The primary symptoms include sore throat, cough, nausea and vomiting. Primary symptoms do not include fever, headaches, ear pain, swollen glands, wheezing, abdominal pain, myalgias, arthralgias or rash. Primary symptoms comment: diarrhea Episode onset: 24 hrs prior to ED arrival. This is a new problem. The problem has not changed since onset. The sore throat is not accompanied by trouble swallowing.  The onset of the illness is associated with exposure to sick contacts. Symptoms associated with the illness include congestion and rhinorrhea. The illness is not associated with chills, facial pain or sinus pressure. The following treatments were addressed: Acetaminophen was not tried. A decongestant was ineffective. NSAIDs were not tried.    Past Medical History  Diagnosis Date  . Asthma   . Hypertension     Past Surgical History  Procedure Date  . Leg surgery   . Cesarean section     No family history on file.  History  Substance Use Topics  . Smoking status: Current Every Day Smoker    Types: Cigarettes  . Smokeless tobacco: Not on file  . Alcohol Use: Yes     2 40 oz daily    OB History    Grav Para Term Preterm Abortions TAB SAB Ect Mult Living                  Review of Systems  Constitutional: Negative for fever, chills, activity change and appetite change.  HENT: Positive for congestion, sore throat and rhinorrhea. Negative for ear pain, trouble swallowing, neck pain, neck stiffness, voice change and sinus pressure.   Eyes: Negative for visual disturbance.    Respiratory: Positive for cough. Negative for chest tightness, shortness of breath and wheezing.   Cardiovascular: Negative for chest pain and leg swelling.  Gastrointestinal: Positive for nausea, vomiting and diarrhea. Negative for abdominal pain and blood in stool.  Genitourinary: Positive for frequency. Negative for dysuria, hematuria, decreased urine volume, vaginal bleeding and vaginal discharge.  Musculoskeletal: Negative for myalgias and arthralgias.  Skin: Negative for rash.  Neurological: Negative for dizziness and headaches.  All other systems reviewed and are negative.    Allergies  Erythromycin  Home Medications   Current Outpatient Rx  Name Route Sig Dispense Refill  . ASPIRIN EFFERVESCENT 325 MG PO TBEF Oral Take 325 mg by mouth every 6 (six) hours as needed. Cold Relief      BP 159/93  Pulse 92  Temp 98.6 F (37 C) (Oral)  Resp 20  Ht 5\' 6"  (1.676 m)  Wt 170 lb (77.111 kg)  BMI 27.44 kg/m2  SpO2 99%  LMP 05/26/2012  Physical Exam  Nursing note and vitals reviewed. Constitutional: She is oriented to person, place, and time. She appears well-developed and well-nourished. No distress.  HENT:  Head: Normocephalic and atraumatic. No trismus in the jaw.  Right Ear: Tympanic membrane and ear canal normal.  Left Ear: Tympanic membrane and ear canal normal.  Nose: Mucosal edema and rhinorrhea present.  Mouth/Throat: Uvula is midline and mucous membranes are normal. No uvula swelling. Posterior oropharyngeal erythema present. No oropharyngeal  exudate, posterior oropharyngeal edema or tonsillar abscesses.  Neck: Normal range of motion and phonation normal. Neck supple. No spinous process tenderness and no muscular tenderness present. No Brudzinski's sign and no Kernig's sign noted.  Cardiovascular: Normal rate, regular rhythm, normal heart sounds and intact distal pulses.   No murmur heard. Pulmonary/Chest: Effort normal and breath sounds normal. She has no decreased  breath sounds. She has no wheezes. She has no rales.  Abdominal: Soft. Bowel sounds are normal. She exhibits no distension and no mass. There is no tenderness. There is no rebound and no guarding.  Musculoskeletal: She exhibits no edema.  Lymphadenopathy:    She has no cervical adenopathy.  Neurological: She is alert and oriented to person, place, and time. She exhibits normal muscle tone. Coordination normal.  Skin: Skin is warm and dry.    ED Course  Procedures (including critical care time)  Labs Reviewed  URINALYSIS, ROUTINE W REFLEX MICROSCOPIC - Abnormal; Notable for the following:    Color, Urine RED (*)  BIOCHEMICALS MAY BE AFFECTED BY COLOR   Hgb urine dipstick LARGE (*)     Protein, ur 30 (*)     Nitrite POSITIVE (*)     Leukocytes, UA TRACE (*)     All other components within normal limits  URINE MICROSCOPIC-ADD ON - Abnormal; Notable for the following:    Bacteria, UA MANY (*)     All other components within normal limits  RAPID STREP SCREEN  PREGNANCY, URINE   Dg Chest 2 View  06/01/2012  *RADIOLOGY REPORT*  Clinical Data: Cough, emesis, diarrhea  CHEST - 2 VIEW  Comparison: Chest x-ray of 04/25/2012  Findings: No active infiltrate or effusion is seen.  There is some peribronchial thickening which may indicate bronchitis. Mediastinal contours are stable.  The heart remains prominent for age.  No bony abnormality is seen.  IMPRESSION: No pneumonia.  Peribronchial thickening may indicate bronchitis.   Original Report Authenticated By: Juline Patch, M.D.     Urine culture is pending   MDM      Patient is feeling better,  Has drank fluids in the ED without further vomiting, abd remains soft , NT.  Non-toxic appearing.  Likely viral illness.  Will treat with abx for UTI.  No CVA tenderness or abdominal pain.  Clinical suspicion for pyleo is low.     Pt agrees to return if her sx's are not improving.  The patient appears reasonably screened and/or stabilized for  discharge and I doubt any other medical condition or other [facility] requiring further screening, evaluation, or treatment in the ED at this time prior to discharge.   Prescribed: Keflex Robitussin AC  Ibuprofen 800 mg   Kristin Carson L. [place], [place] 06/03/12 1649

## 2012-06-01 NOTE — ED Notes (Signed)
Patient with no complaints at this time. Respirations even and unlabored. Skin warm/dry. Discharge instructions reviewed with patient at this time. Patient given opportunity to voice concerns/ask questions. Patient discharged at this time and left Emergency Department with steady gait.   

## 2012-06-02 LAB — URINE CULTURE: Colony Count: NO GROWTH

## 2012-06-05 NOTE — ED Provider Notes (Signed)
Medical screening examination/treatment/procedure(s) were performed by non-physician practitioner and as supervising physician I was immediately available for consultation/collaboration.   Daisey Caloca L Jermone Geister, MD 06/05/12 1615 

## 2012-08-26 ENCOUNTER — Emergency Department (HOSPITAL_COMMUNITY): Payer: Self-pay

## 2012-08-26 ENCOUNTER — Encounter (HOSPITAL_COMMUNITY): Payer: Self-pay

## 2012-08-26 ENCOUNTER — Emergency Department (HOSPITAL_COMMUNITY)
Admission: EM | Admit: 2012-08-26 | Discharge: 2012-08-26 | Disposition: A | Discharge status: Home / Self Care | Payer: Self-pay | Attending: Emergency Medicine | Admitting: Emergency Medicine

## 2012-08-26 DIAGNOSIS — J209 Acute bronchitis, unspecified: Secondary | ICD-10-CM

## 2012-08-26 DIAGNOSIS — J3489 Other specified disorders of nose and nasal sinuses: Secondary | ICD-10-CM | POA: Insufficient documentation

## 2012-08-26 DIAGNOSIS — J40 Bronchitis, not specified as acute or chronic: Secondary | ICD-10-CM | POA: Insufficient documentation

## 2012-08-26 DIAGNOSIS — R062 Wheezing: Secondary | ICD-10-CM | POA: Insufficient documentation

## 2012-08-26 DIAGNOSIS — J45909 Unspecified asthma, uncomplicated: Secondary | ICD-10-CM | POA: Insufficient documentation

## 2012-08-26 DIAGNOSIS — I10 Essential (primary) hypertension: Secondary | ICD-10-CM | POA: Insufficient documentation

## 2012-08-26 DIAGNOSIS — F17200 Nicotine dependence, unspecified, uncomplicated: Secondary | ICD-10-CM | POA: Insufficient documentation

## 2012-08-26 DIAGNOSIS — R0602 Shortness of breath: Secondary | ICD-10-CM | POA: Insufficient documentation

## 2012-08-26 MED ORDER — ALBUTEROL SULFATE HFA 108 (90 BASE) MCG/ACT IN AERS
2.0000 | INHALATION_SPRAY | Freq: Once | RESPIRATORY_TRACT | Status: AC
  Administered 2012-08-26: 2 via RESPIRATORY_TRACT
  Filled 2012-08-26: qty 6.7

## 2012-08-26 MED ORDER — ALBUTEROL SULFATE (5 MG/ML) 0.5% IN NEBU
5.0000 mg | INHALATION_SOLUTION | Freq: Once | RESPIRATORY_TRACT | Status: AC
  Administered 2012-08-26: 5 mg via RESPIRATORY_TRACT
  Filled 2012-08-26: qty 1

## 2012-08-26 MED ORDER — PREDNISONE 20 MG PO TABS: 60.0000 mg | ORAL_TABLET | Freq: Every day | ORAL | Status: DC

## 2012-08-26 MED ORDER — IPRATROPIUM BROMIDE 0.02 % IN SOLN
0.5000 mg | Freq: Once | RESPIRATORY_TRACT | Status: AC
  Administered 2012-08-26: 0.5 mg via RESPIRATORY_TRACT
  Filled 2012-08-26: qty 2.5

## 2012-08-26 MED ORDER — PREDNISONE 50 MG PO TABS
60.0000 mg | ORAL_TABLET | Freq: Once | ORAL | Status: AC
  Administered 2012-08-26: 60 mg via ORAL
  Filled 2012-08-26: qty 1

## 2012-08-26 NOTE — ED Notes (Signed)
C/o cough, wheezing and congestion x 2 days.

## 2012-08-28 NOTE — ED Provider Notes (Signed)
History     CSN: [id number]  Arrival date & time 08/26/12  1150   First MD Initiated Contact with Patient 08/26/12 1421      Chief Complaint  Patient presents with  . Cough  . Nasal Congestion  . Wheezing    (Consider location/radiation/quality/duration/timing/severity/associated sxs/prior treatment) Patient is a 41 y.o. female presenting with wheezing and cough. The history is provided by the patient.  Wheezing  Associated symptoms include cough, shortness of breath and wheezing. Pertinent negatives include no chest pain, no fever and no sore throat. Her past medical history is significant for asthma.  Cough This is a new problem. The current episode started 2 days ago. The problem occurs every few minutes. The problem has not changed since onset.The cough is non-productive. There has been no fever. Associated symptoms include shortness of breath and wheezing. Pertinent negatives include no chest pain, no chills, no headaches and no sore throat. The treatment provided mild relief. She is a smoker. Her past medical history is significant for asthma.    Past Medical History  Diagnosis Date  . Asthma   . Hypertension     Past Surgical History  Procedure Date  . Leg surgery   . Cesarean section     No family history on file.  History  Substance Use Topics  . Smoking status: Current Every Day Smoker    Types: Cigarettes  . Smokeless tobacco: Not on file  . Alcohol Use: Yes     Comment: 2 40 oz daily    OB History    Grav Para Term Preterm Abortions TAB SAB Ect Mult Living                  Review of Systems  Constitutional: Negative for fever and chills.  HENT: Negative for congestion, sore throat and neck pain.   Eyes: Negative.   Respiratory: Positive for cough, shortness of breath and wheezing. Negative for chest tightness.   Cardiovascular: Negative for chest pain.  Gastrointestinal: Negative for nausea and abdominal pain.  Genitourinary: Negative.     Musculoskeletal: Negative for joint swelling and arthralgias.  Skin: Negative.  Negative for rash and wound.  Neurological: Negative for dizziness, weakness, light-headedness, numbness and headaches.  Hematological: Negative.   Psychiatric/Behavioral: Negative.     Allergies  Erythromycin  Home Medications   Current Outpatient Rx  Name  Route  Sig  Dispense  Refill  . ASPIRIN EFFERVESCENT 325 MG PO TBEF   Oral   Take 325 mg by mouth every 6 (six) hours as needed. Cold Relief         . CEPHALEXIN 500 MG PO CAPS   Oral   Take 1 capsule (500 mg total) by mouth 4 (four) times daily. For 10 days   40 capsule   0   . IBUPROFEN 800 MG PO TABS   Oral   Take 1 tablet (800 mg total) by mouth 3 (three) times daily.   21 tablet   0   . PREDNISONE 20 MG PO TABS   Oral   Take 3 tablets (60 mg total) by mouth daily.   12 tablet   0     BP 169/89  Pulse 88  Temp 98.1 F (36.7 C)  Resp 18  Wt 170 lb (77.111 kg)  SpO2 100%  LMP 07/27/2012  Physical Exam  Constitutional: She is oriented to person, place, and time. She appears well-developed and well-nourished.  HENT:  Head: Normocephalic and atraumatic.  Right  Ear: Tympanic membrane and ear canal normal.  Left Ear: Tympanic membrane and ear canal normal.  Nose: Mucosal edema and rhinorrhea present.  Mouth/Throat: Uvula is midline, oropharynx is clear and moist and mucous membranes are normal. No oropharyngeal exudate, posterior oropharyngeal edema, posterior oropharyngeal erythema or tonsillar abscesses.  Eyes: Conjunctivae normal are normal.  Cardiovascular: Normal rate and normal heart sounds.   Pulmonary/Chest: Effort normal. No respiratory distress. She has no decreased breath sounds. She has wheezes. She has no rales. She exhibits no tenderness.       Bilateral wheezing,  Prolonged expirations.    Abdominal: Soft. There is no tenderness.  Musculoskeletal: Normal range of motion.  Neurological: She is alert and  oriented to person, place, and time.  Skin: Skin is warm and dry. No rash noted.  Psychiatric: She has a normal mood and affect.    ED Course  Procedures (including critical care time)  Patient was given albuterol 5/atrovent 0.5 neb with great improvement in wheezing,  Improved aeration. She was also given prednisone 60 mg po.  Labs Reviewed - No data to display No results found.   1. Bronchitis with bronchospasm       MDM  Patients labs and/or radiological studies were reviewed during the medical decision making and disposition process. Pt with acute asthma/bronchitis flare.  She has albuterol nebs at home,  Encouraged to take q4 hours for wheeze or sob.  Prednisone pulse dose, return for any worsened sx.          Burgess Amor, [place] 08/28/12 2257

## 2012-08-29 NOTE — ED Provider Notes (Signed)
Medical screening examination/treatment/procedure(s) were performed by non-physician practitioner and as supervising physician I was immediately available for consultation/collaboration.   Babygirl Trager L Sakiya Stepka, MD 08/29/12 1938 

## 2012-09-02 ENCOUNTER — Emergency Department (HOSPITAL_COMMUNITY)
Admission: EM | Admit: 2012-09-02 | Discharge: 2012-09-02 | Disposition: A | Discharge status: Home / Self Care | Payer: Self-pay | Attending: Emergency Medicine | Admitting: Emergency Medicine

## 2012-09-02 ENCOUNTER — Encounter (HOSPITAL_COMMUNITY): Payer: Self-pay | Admitting: *Deleted

## 2012-09-02 DIAGNOSIS — F17200 Nicotine dependence, unspecified, uncomplicated: Secondary | ICD-10-CM | POA: Insufficient documentation

## 2012-09-02 DIAGNOSIS — Z8709 Personal history of other diseases of the respiratory system: Secondary | ICD-10-CM | POA: Insufficient documentation

## 2012-09-02 DIAGNOSIS — R600 Localized edema: Secondary | ICD-10-CM

## 2012-09-02 DIAGNOSIS — R609 Edema, unspecified: Secondary | ICD-10-CM | POA: Insufficient documentation

## 2012-09-02 DIAGNOSIS — Z79899 Other long term (current) drug therapy: Secondary | ICD-10-CM | POA: Insufficient documentation

## 2012-09-02 DIAGNOSIS — I10 Essential (primary) hypertension: Secondary | ICD-10-CM | POA: Insufficient documentation

## 2012-09-02 DIAGNOSIS — M549 Dorsalgia, unspecified: Secondary | ICD-10-CM | POA: Insufficient documentation

## 2012-09-02 DIAGNOSIS — J45909 Unspecified asthma, uncomplicated: Secondary | ICD-10-CM | POA: Insufficient documentation

## 2012-09-02 HISTORY — DX: Bronchitis, not specified as acute or chronic: J40

## 2012-09-02 LAB — BASIC METABOLIC PANEL
BUN: 16 mg/dL (ref 6–23)
Chloride: 98 mEq/L (ref 96–112)
Creatinine, Ser: 1.02 mg/dL (ref 0.50–1.10)
GFR calc Af Amer: 79 mL/min — ABNORMAL LOW (ref >90)
GFR calc non Af Amer: 68 mL/min — ABNORMAL LOW (ref >90)
Potassium: 4 mEq/L (ref 3.5–5.1)

## 2012-09-02 LAB — CBC WITH DIFFERENTIAL/PLATELET
Basophils Absolute: 0 10*3/uL (ref 0.0–0.1)
Basophils Relative: 0 % (ref 0–1)
Eosinophils Absolute: 0.2 10*3/uL (ref 0.0–0.7)
MCH: 28.5 pg (ref 26.0–34.0)
MCHC: 33.4 g/dL (ref 30.0–36.0)
Neutro Abs: 5.1 10*3/uL (ref 1.7–7.7)
Neutrophils Relative %: 58 % (ref 43–77)
RDW: 15.4 % (ref 11.5–15.5)

## 2012-09-02 MED ORDER — CYCLOBENZAPRINE HCL 10 MG PO TABS: 10.0000 mg | ORAL_TABLET | Freq: Three times a day (TID) | ORAL | Status: DC | PRN

## 2012-09-02 MED ORDER — HYDROCODONE-ACETAMINOPHEN 5-325 MG PO TABS: ORAL_TABLET | ORAL | Status: DC

## 2012-09-02 NOTE — ED Notes (Signed)
Pt ambulatory to restroom, updated on plan of care

## 2012-09-02 NOTE — ED Notes (Signed)
Tammy PA at bedside,  

## 2012-09-02 NOTE — ED Notes (Signed)
Hands and feet swollen,back pain.No injury

## 2012-09-02 NOTE — ED Notes (Signed)
Pt c/o lower back pain, swelling to bilateral hands and feet that started yesterday, denies any injury, when asked about diet pt states "I ate the normal stuff for christmas and new years, [place], stuffing, potatoes, beans, mac and cheese, etc."

## 2012-09-05 NOTE — ED Provider Notes (Signed)
History     CSN: [id number]  Arrival date & time 09/02/12  1137   First MD Initiated Contact with Patient 09/02/12 1222      Chief Complaint  Patient presents with  . Leg Swelling    (Consider location/radiation/quality/duration/timing/severity/associated sxs/prior treatment) HPI Comments: Patient c/o swelling to her bilateral hands and lower legs for several days.  States the swelling is worse in the morning and improves through out the day.  Concerned that she is "holding fluid".  States that she did eat a lot of meats and salty foods through the holidays.  She denies shortness of breath, chest pain, hx of peripheral edema, DVT, or decreased urination.  She also c/o low back pain that been present for several days.  Describes the pain as aching.  She denies injury to her back, incontinence, dysuria, saddle anesthesia's, pain, numbness or weakness to her legs.    Patient is a 42 y.o. female presenting with leg pain. The history is provided by the patient.  Leg Pain  The incident occurred 2 days ago. The incident occurred at home. There was no injury mechanism. The quality of the pain is described as aching. The pain is mild. The pain has been constant since onset. Pertinent negatives include no numbness, no inability to bear weight, no loss of motion, no muscle weakness, no loss of sensation and no tingling. She reports no foreign bodies present. Nothing aggravates the symptoms. She has tried nothing for the symptoms. The treatment provided no relief.    Past Medical History  Diagnosis Date  . Asthma   . Hypertension   . Bronchitis     Past Surgical History  Procedure Date  . Leg surgery   . Cesarean section     History reviewed. No pertinent family history.  History  Substance Use Topics  . Smoking status: Current Every Day Smoker    Types: Cigarettes  . Smokeless tobacco: Not on file  . Alcohol Use: Yes     Comment: 2 40 oz daily    OB History    Grav Para Term  Preterm Abortions TAB SAB Ect Mult Living                  Review of Systems  Constitutional: Negative for fever, chills, activity change and appetite change.  HENT: Negative for facial swelling, trouble swallowing, neck pain and neck stiffness.   Respiratory: Negative for cough and chest tightness.   Cardiovascular: Positive for leg swelling. Negative for chest pain.  Gastrointestinal: Negative for nausea, vomiting and abdominal pain.  Genitourinary: Negative for dysuria and flank pain.  Musculoskeletal: Negative for myalgias, back pain, joint swelling, arthralgias and gait problem.  Skin: Negative for color change and wound.  Neurological: Negative for dizziness, tingling, facial asymmetry, light-headedness, numbness and headaches.  Hematological: Negative for adenopathy.  All other systems reviewed and are negative.    Allergies  Erythromycin  Home Medications   Current Outpatient Rx  Name  Route  Sig  Dispense  Refill  . CYCLOBENZAPRINE HCL 10 MG PO TABS   Oral   Take 1 tablet (10 mg total) by mouth 3 (three) times daily as needed for muscle spasms.   21 tablet   0   . HYDROCODONE-ACETAMINOPHEN 5-325 MG PO TABS      Take one-two tabs po q 4-6 hrs prn pain   20 tablet   0     BP 174/90  Pulse 79  Temp 98.5 F (36.9 C) (Oral)  Resp 18  Ht 5\' 6"  (1.676 m)  Wt 168 lb (76.204 kg)  BMI 27.12 kg/m2  SpO2 100%  LMP 08/29/2012  Physical Exam  Nursing note and vitals reviewed. Constitutional: She is oriented to person, place, and time. She appears well-developed and well-nourished. No distress.  HENT:  Head: Normocephalic and atraumatic.  Mouth/Throat: Oropharynx is clear and moist.  Neck: Normal range of motion. Neck supple.  Cardiovascular: Normal rate, regular rhythm, normal heart sounds and intact distal pulses.   No murmur heard. Pulmonary/Chest: Effort normal and breath sounds normal. No respiratory distress. She exhibits no tenderness.  Abdominal: Soft.  She exhibits no distension. There is no tenderness.  Genitourinary: Vaginal discharge: no obvious edema of the hands or LE's.  DP pulses and radial pulse are brisk, distal sensation intact,    Musculoskeletal: Normal range of motion. She exhibits no edema and no tenderness.       Lumbar back: She exhibits tenderness. She exhibits normal range of motion, no bony tenderness, no swelling, no edema, no deformity, no laceration, no spasm and normal pulse.       Back:       ttp of the lumbar paraspinal muscles.  No sensory deficits  Neurological: She is alert and oriented to person, place, and time. She exhibits normal muscle tone. Coordination normal.  Skin: Skin is warm and dry. No rash noted. No erythema.    ED Course  Procedures (including critical care time)  Labs Reviewed  BASIC METABOLIC PANEL - Abnormal; Notable for the following:    Sodium 132 (*)     GFR calc non Af Amer 68 (*)     GFR calc Af Amer 79 (*)     All other components within normal limits  CBC WITH DIFFERENTIAL  LAB REPORT - SCANNED     1. Back pain   2. Fluid retention in legs       MDM    Pt ambulates with a steady gait.  No focal neuro deficits, no palpable edema of the UE or LE's, lungs CTA bilaterally .  No calf pain, erythema or edema to suggest DVT.  Doubt emergent neuro or infectious process.  Pt advised to elevate her legs, increase water intake and f/u with her PMD        Cortne Amara L. Charmon Thorson, [place] 09/05/12 1719

## 2012-09-06 NOTE — ED Provider Notes (Signed)
Medical screening examination/treatment/procedure(s) were performed by non-physician practitioner and as supervising physician I was immediately available for consultation/collaboration. Devoria Albe, MD, FACEP   Ward Givens, MD 09/06/12 [phone]

## 2012-10-15 ENCOUNTER — Emergency Department (HOSPITAL_COMMUNITY): Payer: Self-pay

## 2012-10-15 ENCOUNTER — Encounter (HOSPITAL_COMMUNITY): Payer: Self-pay | Admitting: Emergency Medicine

## 2012-10-15 ENCOUNTER — Emergency Department (HOSPITAL_COMMUNITY)
Admission: EM | Admit: 2012-10-15 | Discharge: 2012-10-15 | Disposition: A | Discharge status: Home / Self Care | Payer: Self-pay | Attending: Emergency Medicine | Admitting: Emergency Medicine

## 2012-10-15 DIAGNOSIS — I10 Essential (primary) hypertension: Secondary | ICD-10-CM | POA: Insufficient documentation

## 2012-10-15 DIAGNOSIS — R079 Chest pain, unspecified: Secondary | ICD-10-CM

## 2012-10-15 DIAGNOSIS — Z8709 Personal history of other diseases of the respiratory system: Secondary | ICD-10-CM | POA: Insufficient documentation

## 2012-10-15 DIAGNOSIS — N63 Unspecified lump in unspecified breast: Secondary | ICD-10-CM | POA: Insufficient documentation

## 2012-10-15 DIAGNOSIS — F17200 Nicotine dependence, unspecified, uncomplicated: Secondary | ICD-10-CM | POA: Insufficient documentation

## 2012-10-15 DIAGNOSIS — R071 Chest pain on breathing: Secondary | ICD-10-CM | POA: Insufficient documentation

## 2012-10-15 DIAGNOSIS — J45909 Unspecified asthma, uncomplicated: Secondary | ICD-10-CM | POA: Insufficient documentation

## 2012-10-15 DIAGNOSIS — I200 Unstable angina: Secondary | ICD-10-CM | POA: Insufficient documentation

## 2012-10-15 LAB — CBC
HCT: 40.2 % (ref 36.0–46.0)
Hemoglobin: 13.2 g/dL (ref 12.0–15.0)
MCHC: 32.8 g/dL (ref 30.0–36.0)
RDW: 14.8 % (ref 11.5–15.5)
WBC: 7 10*3/uL (ref 4.0–10.5)

## 2012-10-15 LAB — COMPREHENSIVE METABOLIC PANEL
ALT: 17 U/L (ref 0–35)
AST: 19 U/L (ref 0–37)
Albumin: 3.6 g/dL (ref 3.5–5.2)
Alkaline Phosphatase: 49 U/L (ref 39–117)
BUN: 13 mg/dL (ref 6–23)
Chloride: 103 mEq/L (ref 96–112)
Potassium: 3.8 mEq/L (ref 3.5–5.1)
Sodium: 138 mEq/L (ref 135–145)
Total Bilirubin: 0.1 mg/dL — ABNORMAL LOW (ref 0.3–1.2)
Total Protein: 7.5 g/dL (ref 6.0–8.3)

## 2012-10-15 MED ORDER — OXYCODONE-ACETAMINOPHEN 5-325 MG PO TABS
2.0000 | ORAL_TABLET | Freq: Once | ORAL | Status: AC
  Administered 2012-10-15: 2 via ORAL
  Filled 2012-10-15: qty 2

## 2012-10-15 MED ORDER — IBUPROFEN 400 MG PO TABS
400.0000 mg | ORAL_TABLET | Freq: Once | ORAL | Status: AC
  Administered 2012-10-15: 400 mg via ORAL
  Filled 2012-10-15: qty 1

## 2012-10-15 MED ORDER — OXYCODONE-ACETAMINOPHEN 5-325 MG PO TABS: 2.0000 | ORAL_TABLET | Freq: Three times a day (TID) | ORAL | Status: DC | PRN

## 2012-10-15 NOTE — ED Provider Notes (Signed)
History     CSN: [id number]  Arrival date & time 10/15/12  7829   First MD Initiated Contact with Patient 10/15/12 [phone]      Chief Complaint  Patient presents with  . Chest Pain    (Consider location/radiation/quality/duration/timing/severity/associated sxs/prior treatment) HPI This 42 year old female woke up this morning with constant sharp stabbing localized left chest pain radiating slightly to her left upper arm nonexertional but mildly pleuritic and slightly worse with position changes, present for the last few hours, without associated symptoms such as fever cough shortness of breath sweats nausea or vomiting. She did have to nonbloody loose stools today. She is no back pain abdominal pain neck pain jaw pain leg pain or leg swelling. She is no recent travel trauma or immobilization. She is not on estrogen. She is PERC negative. She has no recent exertional chest pain. She is no syncope or lightheadedness. Treatment prior to arrival consisted 4 baby aspirin from EMS. She felt fine last night when she went to bed. Her pain is moderately severe. Past Medical History  Diagnosis Date  . Asthma   . Hypertension   . Bronchitis     Past Surgical History  Procedure Laterality Date  . Leg surgery    . Cesarean section      History reviewed. No pertinent family history.  History  Substance Use Topics  . Smoking status: Current Every Day Smoker    Types: Cigarettes  . Smokeless tobacco: Not on file  . Alcohol Use: Yes     Comment: 2 40 oz daily    OB History   Grav Para Term Preterm Abortions TAB SAB Ect Mult Living                  Review of Systems 10 Systems reviewed and are negative for acute change except as noted in the HPI. Allergies  Erythromycin  Home Medications   Current Outpatient Rx  Name  Route  Sig  Dispense  Refill  . acetaminophen (TYLENOL) 500 MG tablet   Oral   Take 1,000 mg by mouth every 6 (six) hours as needed for pain.         Marland Kitchen  HYDROcodone-acetaminophen (NORCO) 7.5-325 MG per tablet   Oral   Take 1 tablet by mouth every 4 (four) hours as needed for pain.   20 tablet   0   . oxyCODONE-acetaminophen (PERCOCET) 5-325 MG per tablet   Oral   Take 2 tablets by mouth every 8 (eight) hours as needed for pain.   10 tablet   0     BP 130/82  Pulse 81  Temp(Src) 97.9 F (36.6 C) (Oral)  Resp 15  Ht 5\' 6"  (1.676 m)  Wt 170 lb (77.111 kg)  BMI 27.45 kg/m2  SpO2 100%  LMP 09/14/2012  Physical Exam  Nursing note and vitals reviewed. Constitutional:  Awake, alert, nontoxic appearance.  HENT:  Head: Atraumatic.  Eyes: Right eye exhibits no discharge. Left eye exhibits no discharge.  Neck: Neck supple.  Cardiovascular: Normal rate and regular rhythm.   No murmur heard. Pulmonary/Chest: Effort normal and breath sounds normal. No respiratory distress. She has no wheezes. She has no rales. She exhibits tenderness.  Mild left lateral chest wall tenderness partially reproduces pain; chaperone present for left breast examination reveals nontender rounded and smooth lump near the nipple region which the patient states has been stable for the last year she is advised to follow up with a new primary care Dr.  to consider ultrasound, mammogram, and or other testing for breast mass, which seems unrelated to her new chest pain complaint.  Abdominal: Soft. There is no tenderness. There is no rebound.  Musculoskeletal: She exhibits no edema and no tenderness.  Baseline ROM, no obvious new focal weakness.  Neurological: She is alert.  Mental status and motor strength appears baseline for patient and situation.  Skin: No rash noted.  Psychiatric: She has a normal mood and affect.    ED Course  Procedures (including critical care time) ECG: Normal sinus rhythm, sinus arrhythmia, jugular rate 84, normal axis, normal intervals, no acute ischemic changes noted, no significant change noted compared with August 2013  Clinically I  have low suspicion for acute coronary syndrome and doubt pulmonary embolism. Labs Reviewed  COMPREHENSIVE METABOLIC PANEL - Abnormal; Notable for the following:    Glucose, Bld 102 (*)    Total Bilirubin 0.1 (*)    GFR calc non Af Amer 73 (*)    GFR calc Af Amer 84 (*)    All other components within normal limits  CBC  TROPONIN I  TROPONIN I   No results found.   1. Chest pain with low risk of acute coronary syndrome   2. Breast lump in female       MDM  Pt stable in ED with no significant deterioration in condition.  Patient / Family / Caregiver informed of clinical course, understand medical decision-making process, and agree with plan.  I doubt any other EMC precluding discharge at this time including, but not necessarily limited to the following:high risk ACS, PE.        Hurman Horn, MD 10/25/12 [phone]

## 2012-10-15 NOTE — ED Notes (Signed)
Chest pain woke her up this am. Worse with deep breath on left side.

## 2012-10-15 NOTE — ED Notes (Signed)
Patient states she does not need anything at this time. 

## 2012-10-25 ENCOUNTER — Emergency Department (HOSPITAL_COMMUNITY)
Admission: EM | Admit: 2012-10-25 | Discharge: 2012-10-25 | Disposition: A | Discharge status: Home / Self Care | Payer: Self-pay | Attending: Emergency Medicine | Admitting: Emergency Medicine

## 2012-10-25 ENCOUNTER — Encounter (HOSPITAL_COMMUNITY): Payer: Self-pay | Admitting: *Deleted

## 2012-10-25 DIAGNOSIS — F17200 Nicotine dependence, unspecified, uncomplicated: Secondary | ICD-10-CM | POA: Insufficient documentation

## 2012-10-25 DIAGNOSIS — N63 Unspecified lump in unspecified breast: Secondary | ICD-10-CM | POA: Insufficient documentation

## 2012-10-25 DIAGNOSIS — I10 Essential (primary) hypertension: Secondary | ICD-10-CM | POA: Insufficient documentation

## 2012-10-25 DIAGNOSIS — J45901 Unspecified asthma with (acute) exacerbation: Secondary | ICD-10-CM | POA: Insufficient documentation

## 2012-10-25 MED ORDER — HYDROCODONE-ACETAMINOPHEN 5-325 MG PO TABS
2.0000 | ORAL_TABLET | Freq: Once | ORAL | Status: AC
  Administered 2012-10-25: 2 via ORAL
  Filled 2012-10-25: qty 2

## 2012-10-25 MED ORDER — HYDROCODONE-ACETAMINOPHEN 7.5-325 MG PO TABS: 1.0000 | ORAL_TABLET | ORAL | Status: DC | PRN

## 2012-10-25 NOTE — ED Notes (Signed)
Pt with knot to left nipple, denies any drainage, denies fever or N/V, pt denies PCP, pt with pain all over left breast, describes as aching

## 2012-10-25 NOTE — ED Notes (Signed)
Pt says "knot " in lt breast .  Says she has been seen for this in past, and was told to f/u with her md, says she does not have an md to see.  Feels the "knot " is getting bigger.

## 2012-10-25 NOTE — ED Provider Notes (Signed)
History     CSN: [id number]  Arrival date & time 10/25/12  1319   First MD Initiated Contact with Patient 10/25/12 1546      Chief Complaint  Patient presents with  . Breast Problem    (Consider location/radiation/quality/duration/timing/severity/associated sxs/prior treatment) HPI Comments: Pt presents to the ED with complaint of "knot in the breast". Pt has been seen on 2 previous occasions for evaluation of the knot in the left breast. She has been advised to see her MD or [facility]. She states she does not have insurance or means to do either of the above. Pt presents to ED today because the knot is getting larger, an she is afraid of cancer. She has only taken tylenol for the pain. No other symptoms reported, No fever, no bleeding from the nipple, no axilla  Pain, an no mass at other areas.  The history is provided by the patient.    Past Medical History  Diagnosis Date  . Asthma   . Hypertension   . Bronchitis     Past Surgical History  Procedure Laterality Date  . Leg surgery    . Cesarean section      History reviewed. No pertinent family history.  History  Substance Use Topics  . Smoking status: Current Every Day Smoker    Types: Cigarettes  . Smokeless tobacco: Not on file  . Alcohol Use: Yes     Comment: 2 40 oz daily    OB History   Grav Para Term Preterm Abortions TAB SAB Ect Mult Living                  Review of Systems  Constitutional: Negative for activity change.       All ROS Neg except as noted in HPI  HENT: Negative for nosebleeds and neck pain.   Eyes: Negative for photophobia and discharge.  Respiratory: Positive for wheezing. Negative for cough and shortness of breath.   Cardiovascular: Negative for chest pain and palpitations.  Gastrointestinal: Negative for abdominal pain and blood in stool.  Genitourinary: Negative for dysuria, frequency and hematuria.  Musculoskeletal: Negative for back pain and  arthralgias.  Skin: Negative.   Neurological: Negative for dizziness, seizures and speech difficulty.  Psychiatric/Behavioral: Negative for hallucinations and confusion.    Allergies  Erythromycin  Home Medications   Current Outpatient Rx  Name  Route  Sig  Dispense  Refill  . acetaminophen (TYLENOL) 500 MG tablet   Oral   Take 1,000 mg by mouth every 6 (six) hours as needed for pain.         Marland Kitchen oxyCODONE-acetaminophen (PERCOCET) 5-325 MG per tablet   Oral   Take 2 tablets by mouth every 8 (eight) hours as needed for pain.   10 tablet   0     BP 185/92  Pulse 99  Temp(Src) 98.6 F (37 C) (Oral)  Resp 20  Ht 5\' 4"  (1.626 m)  Wt 165 lb (74.844 kg)  BMI 28.31 kg/m2  SpO2 100%  LMP 09/10/2012  Physical Exam  Nursing note and vitals reviewed. Constitutional: She is oriented to person, place, and time. She appears well-developed and well-nourished.  Non-toxic appearance.  HENT:  Head: Normocephalic.  Right Ear: Tympanic membrane and external ear normal.  Left Ear: Tympanic membrane and external ear normal.  Eyes: EOM and lids are normal. Pupils are equal, round, and reactive to light.  Neck: Normal range of motion. Neck supple. Carotid bruit  is not present.  Cardiovascular: Normal rate, regular rhythm, normal heart sounds, intact distal pulses and normal pulses.   Pulmonary/Chest: Breath sounds normal. No respiratory distress.  There is a mass behind the left nipple. Tender to palpation. No drainage from the nipple area. No red streaks of the breasts. No mass in the axilla appreciated.  Abdominal: Soft. Bowel sounds are normal. There is no tenderness. There is no guarding.  Musculoskeletal: Normal range of motion.  Lymphadenopathy:       Head (right side): No submandibular adenopathy present.       Head (left side): No submandibular adenopathy present.    She has no cervical adenopathy.  Neurological: She is alert and oriented to person, place, and time. She has  normal strength. No cranial nerve deficit or sensory deficit.  Skin: Skin is warm and dry.  Psychiatric: She has a normal mood and affect. Her speech is normal.    ED Course  Procedures (including critical care time)  Labs Reviewed - No data to display No results found.   No diagnosis found.    MDM  I have reviewed nursing notes, vital signs, and all appropriate lab and imaging results for this patient. This is the third visit for the patient concerning the a mass in the left breast. The patient states that she does not have insurance or finances to go to a specialist or to a primary physician.  I've spoken with the medical social worker who will turn her case over to the financial specialist to see if there are resources available for this patient. I've also spoken with the mammography technician, who informs me that there are funds available at the health department for this type of study that would be available to this patient.  I have discussed the need to go to the health Department and to describe the problem and issues that she is having. The patient agrees to go to the health department to secure the voucher for this test. The patient states that she is having pain and again she has no one to see to help her with her pain. Prescription for Norco 7.5 mg one every 4 hours for pain given to the patient.       Kathie Dike, [place] 10/29/12 [phone]

## 2012-10-25 NOTE — ED Notes (Signed)
Nurse in room at time of H. Beverely Pace, PA's exam of left breast

## 2012-10-29 NOTE — ED Provider Notes (Signed)
Medical screening examination/treatment/procedure(s) were performed by non-physician practitioner and as supervising physician I was immediately available for consultation/collaboration.   [name], MD 10/29/12 [phone]

## 2012-11-04 ENCOUNTER — Emergency Department (HOSPITAL_COMMUNITY)
Admission: EM | Admit: 2012-11-04 | Discharge: 2012-11-04 | Disposition: A | Discharge status: Home / Self Care | Payer: Self-pay | Attending: Emergency Medicine | Admitting: Emergency Medicine

## 2012-11-04 ENCOUNTER — Emergency Department (HOSPITAL_COMMUNITY): Payer: Self-pay

## 2012-11-04 ENCOUNTER — Encounter (HOSPITAL_COMMUNITY): Payer: Self-pay | Admitting: Emergency Medicine

## 2012-11-04 DIAGNOSIS — R0602 Shortness of breath: Secondary | ICD-10-CM | POA: Insufficient documentation

## 2012-11-04 DIAGNOSIS — F17200 Nicotine dependence, unspecified, uncomplicated: Secondary | ICD-10-CM | POA: Insufficient documentation

## 2012-11-04 DIAGNOSIS — J45901 Unspecified asthma with (acute) exacerbation: Secondary | ICD-10-CM | POA: Insufficient documentation

## 2012-11-04 DIAGNOSIS — R51 Headache: Secondary | ICD-10-CM | POA: Insufficient documentation

## 2012-11-04 DIAGNOSIS — I10 Essential (primary) hypertension: Secondary | ICD-10-CM | POA: Insufficient documentation

## 2012-11-04 DIAGNOSIS — Z79899 Other long term (current) drug therapy: Secondary | ICD-10-CM | POA: Insufficient documentation

## 2012-11-04 MED ORDER — PROMETHAZINE HCL 12.5 MG PO TABS
25.0000 mg | ORAL_TABLET | Freq: Once | ORAL | Status: AC
  Administered 2012-11-04: 25 mg via ORAL
  Filled 2012-11-04: qty 2

## 2012-11-04 MED ORDER — PSEUDOEPHEDRINE HCL 60 MG PO TABS
60.0000 mg | ORAL_TABLET | Freq: Once | ORAL | Status: AC
  Administered 2012-11-04: 60 mg via ORAL
  Filled 2012-11-04: qty 1

## 2012-11-04 MED ORDER — FENTANYL CITRATE 0.05 MG/ML IJ SOLN
50.0000 ug | Freq: Once | INTRAMUSCULAR | Status: AC
  Administered 2012-11-04: via INTRAMUSCULAR
  Filled 2012-11-04: qty 2

## 2012-11-04 MED ORDER — DOXYCYCLINE HYCLATE 100 MG PO CAPS: 100.0000 mg | ORAL_CAPSULE | Freq: Two times a day (BID) | ORAL | Status: DC

## 2012-11-04 NOTE — ED Provider Notes (Signed)
History     CSN: [id number]  Arrival date & time 11/04/12  0003   First MD Initiated Contact with Patient 11/04/12 0006      Chief Complaint  Patient presents with  . Shortness of Breath  . Hypertension  . Headache    (Consider location/radiation/quality/duration/timing/severity/associated sxs/prior treatment) Patient is a 42 y.o. female presenting with shortness of breath, hypertension, and headaches. The history is provided by the patient.  Shortness of Breath Severity:  Moderate Onset quality:  Sudden Timing:  Intermittent Chronicity:  New Context: URI   Context comment:  SOB today after using septra and norco. C/O headache. Relieved by:  Nothing Worsened by:  Nothing tried Ineffective treatments:  None tried Associated symptoms: headaches   Associated symptoms: no abdominal pain, no chest pain, no cough, no diaphoresis, no hemoptysis, no neck pain, no sputum production, no syncope, no vomiting and no wheezing   Risk factors: alcohol use, recent surgery and tobacco use   Hypertension Associated symptoms include headaches. Pertinent negatives include no abdominal pain, arthralgias, chest pain, coughing, diaphoresis, neck pain or vomiting.  Headache Associated symptoms: no abdominal pain, no back pain, no cough, no dizziness, no neck pain, no photophobia, no seizures, no syncope and no vomiting     Past Medical History  Diagnosis Date  . Asthma   . Hypertension   . Bronchitis     Past Surgical History  Procedure Laterality Date  . Leg surgery    . Cesarean section      History reviewed. No pertinent family history.  History  Substance Use Topics  . Smoking status: Current Every Day Smoker    Types: Cigarettes  . Smokeless tobacco: Not on file  . Alcohol Use: Yes     Comment: 2 40 oz daily    OB History   Grav Para Term Preterm Abortions TAB SAB Ect Mult Living                  Review of Systems  Constitutional: Negative for diaphoresis and activity  change.       All ROS Neg except as noted in HPI  HENT: Negative for nosebleeds and neck pain.   Eyes: Negative for photophobia and discharge.  Respiratory: Positive for shortness of breath. Negative for cough, hemoptysis, sputum production, choking, wheezing and stridor.   Cardiovascular: Negative for chest pain, palpitations and syncope.  Gastrointestinal: Negative for vomiting, abdominal pain and blood in stool.  Genitourinary: Negative for dysuria, frequency and hematuria.  Musculoskeletal: Negative for back pain and arthralgias.  Skin: Negative.   Neurological: Positive for headaches. Negative for dizziness, seizures and speech difficulty.  Psychiatric/Behavioral: Negative for hallucinations and confusion.    Allergies  Erythromycin  Home Medications   Current Outpatient Rx  Name  Route  Sig  Dispense  Refill  . HYDROcodone-acetaminophen (NORCO/VICODIN) 5-325 MG per tablet   Oral   Take 1 tablet by mouth every 6 (six) hours as needed for pain.         Marland Kitchen sulfamethoxazole-trimethoprim (BACTRIM DS) 800-160 MG per tablet   Oral   Take 1 tablet by mouth 2 (two) times daily.         Marland Kitchen acetaminophen (TYLENOL) 500 MG tablet   Oral   Take 1,000 mg by mouth every 6 (six) hours as needed for pain.         Marland Kitchen HYDROcodone-acetaminophen (NORCO) 7.5-325 MG per tablet   Oral   Take 1 tablet by mouth every 4 (four) hours  as needed for pain.   20 tablet   0   . oxyCODONE-acetaminophen (PERCOCET) 5-325 MG per tablet   Oral   Take 2 tablets by mouth every 8 (eight) hours as needed for pain.   10 tablet   0     BP 198/98  Pulse 100  Temp(Src) 98.2 F (36.8 C) (Oral)  Resp 18  Ht 5\' 6"  (1.676 m)  Wt 160 lb (72.576 kg)  BMI 25.84 kg/m2  SpO2 98%  LMP 10/19/2012  Physical Exam  Nursing note and vitals reviewed. Constitutional: She is oriented to person, place, and time. She appears well-developed and well-nourished.  Non-toxic appearance.  HENT:  Head: Normocephalic.   Right Ear: Tympanic membrane and external ear normal.  Left Ear: Tympanic membrane and external ear normal.  Eyes: EOM and lids are normal. Pupils are equal, round, and reactive to light.  Neck: Trachea normal and normal range of motion. Neck supple. Carotid bruit is not present.  Cardiovascular: Normal rate, regular rhythm, normal heart sounds, intact distal pulses and normal pulses.   Pulmonary/Chest: Effort normal and breath sounds normal. No respiratory distress.  Bandage to the left breast. Symmetrical rise and fall of the chest. No retractions. No distress. Pt speaks in complete sentences without problem.  Abdominal: Soft. Bowel sounds are normal. There is no tenderness. There is no guarding.  Musculoskeletal: Normal range of motion.  Lymphadenopathy:       Head (right side): No submandibular adenopathy present.       Head (left side): No submandibular adenopathy present.    She has no cervical adenopathy.  Neurological: She is alert and oriented to person, place, and time. She has normal strength. No cranial nerve deficit or sensory deficit.  Skin: Skin is warm and dry.  Psychiatric: She has a normal mood and affect. Her speech is normal.    ED Course  Procedures (including critical care time)  Labs Reviewed - No data to display No results found.  EKG: normal sinus rhythm, Rate 98. PR normal. LAE. QRS normal. Axis normal. ST normal. No STEMI. Unchanged from  Oct 15, 2012 EKG.. No diagnosis found.    MDM  I have reviewed nursing notes, vital signs, and all appropriate lab and imaging results for this patient. Recheck at 12:31 - No rash. No mucosal swelling. Pt speaks in complete sentences. EKG showes NSR. No acute changes. 12:55 - Pt continues to speak in complete sentences. States she is feeling better, headache improving. No rash. Oral pharynx wnl.  Chest xray is negative. Pulse Ox 98 to 100% on RA. Pt ambulatory without problem. Plan. Stop septra. Start doxycycline. Pt to  follow up with surgeon at [facility] on Friday 3/7. Pt to return to [facility] see MD at [facility] if any changes or concerns.      Kathie Dike, PA-C 11/04/12 [phone]

## 2012-11-04 NOTE — ED Notes (Signed)
Pt c/o headache, sob and uncontrolled htn.

## 2012-11-09 NOTE — ED Provider Notes (Signed)
Medical screening examination/treatment/procedure(s) were performed by non-physician practitioner and as supervising physician I was immediately available for consultation/collaboration.  Nicoletta Dress. Colon Branch, MD 11/09/12 [phone]

## 2012-12-03 ENCOUNTER — Encounter (HOSPITAL_COMMUNITY): Payer: Self-pay | Admitting: Emergency Medicine

## 2012-12-03 ENCOUNTER — Emergency Department (HOSPITAL_COMMUNITY)
Admission: EM | Admit: 2012-12-03 | Discharge: 2012-12-03 | Disposition: A | Discharge status: Home / Self Care | Payer: Self-pay | Attending: Emergency Medicine | Admitting: Emergency Medicine

## 2012-12-03 DIAGNOSIS — F17200 Nicotine dependence, unspecified, uncomplicated: Secondary | ICD-10-CM | POA: Insufficient documentation

## 2012-12-03 DIAGNOSIS — I10 Essential (primary) hypertension: Secondary | ICD-10-CM | POA: Insufficient documentation

## 2012-12-03 DIAGNOSIS — R112 Nausea with vomiting, unspecified: Secondary | ICD-10-CM | POA: Insufficient documentation

## 2012-12-03 DIAGNOSIS — J45909 Unspecified asthma, uncomplicated: Secondary | ICD-10-CM | POA: Insufficient documentation

## 2012-12-03 DIAGNOSIS — R197 Diarrhea, unspecified: Secondary | ICD-10-CM | POA: Insufficient documentation

## 2012-12-03 DIAGNOSIS — Z9889 Other specified postprocedural states: Secondary | ICD-10-CM | POA: Insufficient documentation

## 2012-12-03 DIAGNOSIS — R51 Headache: Secondary | ICD-10-CM | POA: Insufficient documentation

## 2012-12-03 DIAGNOSIS — R109 Unspecified abdominal pain: Secondary | ICD-10-CM | POA: Insufficient documentation

## 2012-12-03 DIAGNOSIS — R52 Pain, unspecified: Secondary | ICD-10-CM | POA: Insufficient documentation

## 2012-12-03 DIAGNOSIS — Z79899 Other long term (current) drug therapy: Secondary | ICD-10-CM | POA: Insufficient documentation

## 2012-12-03 MED ORDER — SODIUM CHLORIDE 0.9 % IV SOLN
Freq: Once | INTRAVENOUS | Status: AC
  Administered 2012-12-03: 04:00:00 via INTRAVENOUS

## 2012-12-03 MED ORDER — HYDROMORPHONE HCL PF 1 MG/ML IJ SOLN
1.0000 mg | Freq: Once | INTRAMUSCULAR | Status: AC
  Administered 2012-12-03: 1 mg via INTRAVENOUS
  Filled 2012-12-03: qty 1

## 2012-12-03 MED ORDER — ONDANSETRON 4 MG PO TBDP: 4.0000 mg | ORAL_TABLET | Freq: Three times a day (TID) | ORAL | Status: DC | PRN

## 2012-12-03 MED ORDER — IBUPROFEN 800 MG PO TABS: 800.0000 mg | ORAL_TABLET | Freq: Three times a day (TID) | ORAL | Status: DC

## 2012-12-03 MED ORDER — ONDANSETRON 4 MG PO TBDP
4.0000 mg | ORAL_TABLET | Freq: Once | ORAL | Status: AC
  Administered 2012-12-03: 4 mg via ORAL
  Filled 2012-12-03: qty 1

## 2012-12-03 NOTE — ED Provider Notes (Signed)
History     CSN: [id number]  Arrival date & time 12/03/12  0115   First MD Initiated Contact with Patient 12/03/12 0308      Chief Complaint  Patient presents with  . Generalized Body Aches  . Abdominal Pain  . Emesis  . Diarrhea  . Headache    (Consider location/radiation/quality/duration/timing/severity/associated sxs/prior treatment) HPI Kristin Carson is a 42 y.o. female who presents to the Emergency Department complaining of headache, abdominal pain, nausea, vomiting and diarrhea that began yesterday AM. Denies fever, chills, cough, shortness of breath.  Past Medical History  Diagnosis Date  . Asthma   . Hypertension   . Bronchitis     Past Surgical History  Procedure Laterality Date  . Leg surgery    . Cesarean section      No family history on file.  History  Substance Use Topics  . Smoking status: Current Every Day Smoker    Types: Cigarettes  . Smokeless tobacco: Not on file  . Alcohol Use: Yes     Comment: 2 40 oz daily    OB History   Grav Para Term Preterm Abortions TAB SAB Ect Mult Living                  Review of Systems  Constitutional: Negative for fever.       10 Systems reviewed and are negative for acute change except as noted in the HPI.  HENT: Negative for congestion.   Eyes: Negative for discharge and redness.  Respiratory: Negative for cough and shortness of breath.   Cardiovascular: Negative for chest pain.  Gastrointestinal: Positive for nausea, vomiting and abdominal pain.  Musculoskeletal: Negative for back pain.  Skin: Negative for rash.  Neurological: Positive for headaches. Negative for syncope and numbness.  Psychiatric/Behavioral:       No behavior change.    Allergies  Erythromycin  Home Medications   Current Outpatient Rx  Name  Route  Sig  Dispense  Refill  . acetaminophen (TYLENOL) 500 MG tablet   Oral   Take 1,000 mg by mouth every 6 (six) hours as needed for pain.         Marland Kitchen doxycycline (VIBRAMYCIN) 100  MG capsule   Oral   Take 1 capsule (100 mg total) by mouth 2 (two) times daily.   14 capsule   0   . HYDROcodone-acetaminophen (NORCO) 7.5-325 MG per tablet   Oral   Take 1 tablet by mouth every 4 (four) hours as needed for pain.   20 tablet   0   . HYDROcodone-acetaminophen (NORCO/VICODIN) 5-325 MG per tablet   Oral   Take 1 tablet by mouth every 6 (six) hours as needed for pain.         Marland Kitchen oxyCODONE-acetaminophen (PERCOCET) 5-325 MG per tablet   Oral   Take 2 tablets by mouth every 8 (eight) hours as needed for pain.   10 tablet   0   . sulfamethoxazole-trimethoprim (BACTRIM DS) 800-160 MG per tablet   Oral   Take 1 tablet by mouth 2 (two) times daily.           BP 145/99  Pulse 96  Temp(Src) 98.3 F (36.8 C) (Oral)  Resp 20  Ht 5\' 6"  (1.676 m)  Wt 180 lb (81.647 kg)  BMI 29.07 kg/m2  SpO2 99%  LMP 11/02/2012  Physical Exam  Nursing note and vitals reviewed. Constitutional:  Awake, alert, nontoxic appearance.  HENT:  Head: Atraumatic.  Eyes:  Right eye exhibits no discharge. Left eye exhibits no discharge.  Neck: Neck supple.  Cardiovascular: Normal rate and intact distal pulses.   Pulmonary/Chest: Effort normal and breath sounds normal. She exhibits no tenderness.  Abdominal: Soft. Bowel sounds are normal. There is no tenderness. There is no rebound.  Musculoskeletal: She exhibits no tenderness.  Baseline ROM, no obvious new focal weakness.  Neurological:  Mental status and motor strength appears baseline for patient and situation.  Skin: No rash noted.  Psychiatric: She has a normal mood and affect.    ED Course  Procedures (including critical care time)    MDM  Patient with headache, abdominal pain, nausea, diarrhea over the past day. Given IVF, zofran, dilaudid with relief. Nausea and vomiting resolved. Headache was resolved. She has taken PO fluids. Pt stable in ED with no significant deterioration in condition.The patient appears reasonably  screened and/or stabilized for discharge and I doubt any other medical condition or other [facility] requiring further screening, evaluation, or treatment in the ED at this time prior to discharge.  MDM Reviewed: nursing note and vitals           Nicoletta Dress. Colon Branch, MD 12/03/12 [phone]

## 2012-12-03 NOTE — ED Notes (Signed)
Patient c/o generalized body aches, headache, abdominal pain, nausea, vomiting x 4 in 24 hours.

## 2012-12-03 NOTE — ED Notes (Signed)
Pt c/o generalized body aches with headache, and abdominal pain, N/V, light sensitivity. C/o sinus congestion.

## 2013-01-04 ENCOUNTER — Emergency Department (HOSPITAL_COMMUNITY)
Admission: EM | Admit: 2013-01-04 | Discharge: 2013-01-04 | Disposition: A | Discharge status: Home / Self Care | Payer: Self-pay | Attending: Emergency Medicine | Admitting: Emergency Medicine

## 2013-01-04 ENCOUNTER — Encounter (HOSPITAL_COMMUNITY): Payer: Self-pay | Admitting: *Deleted

## 2013-01-04 DIAGNOSIS — I10 Essential (primary) hypertension: Secondary | ICD-10-CM | POA: Insufficient documentation

## 2013-01-04 DIAGNOSIS — B9789 Other viral agents as the cause of diseases classified elsewhere: Secondary | ICD-10-CM | POA: Insufficient documentation

## 2013-01-04 DIAGNOSIS — Z8709 Personal history of other diseases of the respiratory system: Secondary | ICD-10-CM | POA: Insufficient documentation

## 2013-01-04 DIAGNOSIS — F17200 Nicotine dependence, unspecified, uncomplicated: Secondary | ICD-10-CM | POA: Insufficient documentation

## 2013-01-04 DIAGNOSIS — R112 Nausea with vomiting, unspecified: Secondary | ICD-10-CM | POA: Insufficient documentation

## 2013-01-04 DIAGNOSIS — B349 Viral infection, unspecified: Secondary | ICD-10-CM

## 2013-01-04 DIAGNOSIS — R51 Headache: Secondary | ICD-10-CM | POA: Insufficient documentation

## 2013-01-04 DIAGNOSIS — J45901 Unspecified asthma with (acute) exacerbation: Secondary | ICD-10-CM | POA: Insufficient documentation

## 2013-01-04 DIAGNOSIS — J029 Acute pharyngitis, unspecified: Secondary | ICD-10-CM | POA: Insufficient documentation

## 2013-01-04 LAB — CBC WITH DIFFERENTIAL/PLATELET
Basophils Absolute: 0 10*3/uL (ref 0.0–0.1)
Basophils Relative: 0 % (ref 0–1)
Eosinophils Relative: 2 % (ref 0–5)
HCT: 37.2 % (ref 36.0–46.0)
Lymphocytes Relative: 23 % (ref 12–46)
MCHC: 33.3 g/dL (ref 30.0–36.0)
MCV: 81.6 fL (ref 78.0–100.0)
Monocytes Absolute: 0.5 10*3/uL (ref 0.1–1.0)
Platelets: 248 10*3/uL (ref 150–400)
RDW: 15 % (ref 11.5–15.5)
WBC: 4.8 10*3/uL (ref 4.0–10.5)

## 2013-01-04 LAB — BASIC METABOLIC PANEL
Calcium: 8.6 mg/dL (ref 8.4–10.5)
Creatinine, Ser: 1.05 mg/dL (ref 0.50–1.10)
GFR calc Af Amer: 76 mL/min — ABNORMAL LOW (ref >90)
GFR calc non Af Amer: 66 mL/min — ABNORMAL LOW (ref >90)
Sodium: 136 mEq/L (ref 135–145)

## 2013-01-04 LAB — RAPID STREP SCREEN (MED CTR MEBANE ONLY): Streptococcus, Group A Screen (Direct): NEGATIVE

## 2013-01-04 MED ORDER — IBUPROFEN 800 MG PO TABS: 800.0000 mg | ORAL_TABLET | Freq: Three times a day (TID) | ORAL | Status: DC | PRN

## 2013-01-04 MED ORDER — HYDROCODONE-ACETAMINOPHEN 5-325 MG PO TABS
1.0000 | ORAL_TABLET | Freq: Once | ORAL | Status: AC
  Administered 2013-01-04: 1 via ORAL
  Filled 2013-01-04: qty 1

## 2013-01-04 NOTE — ED Notes (Signed)
Sore throat, abd pain , headache, cough,nausea, vomiting, and diarrhea, feels sob

## 2013-01-04 NOTE — ED Provider Notes (Signed)
History  This chart was scribed for [name], MD by Bennett Scrape, ED Scribe. This patient was seen in room APA14/[facility]14 and the patient's care was started at 3:30 PM.  CSN: [id number]  Arrival date & time 01/04/13  1452   First MD Initiated Contact with Patient 01/04/13 1527      Chief Complaint  Patient presents with  . Abdominal Pain     Patient is a 42 y.o. female presenting with abdominal pain. The history is provided by the patient. No language interpreter was used.  Abdominal Pain Pain location:  Generalized Pain radiates to:  Does not radiate Pain severity:  Mild Onset quality:  Gradual Duration:  1 day Timing:  Constant Progression:  Worsening Chronicity:  New Context: awakening from sleep   Relieved by:  Nothing Worsened by:  Nothing tried Ineffective treatments:  None tried Associated symptoms: nausea, shortness of breath, sore throat and vomiting   Associated symptoms: no chest pain, no chills, no cough, no diarrhea, no fatigue, no fever and no hematuria     HPI Comments: Kristin Carson is a 42 y.o. female who presents to the Emergency Department complaining of gradual onset, gradually worsening, constant generalized abdominal pain described as cramping with associated sore throat, HA, nausea, one episode of non-bloody emesis and mild SOB that started yesterday. Pt denies having any sick contacts with similar symptoms. She denies fevers, chills and diarrhea as associated symptoms. She has a h/o HTN, asthma and bronchitis. Pt is a current everyday smoker and occasional alcohol user.   Past Medical History  Diagnosis Date  . Asthma   . Hypertension   . Bronchitis     Past Surgical History  Procedure Laterality Date  . Leg surgery    . Cesarean section      History reviewed. No pertinent family history.  History  Substance Use Topics  . Smoking status: Current Every Day Smoker    Types: Cigarettes  . Smokeless tobacco: Not on file  . Alcohol Use:  Yes     Comment: 2 40 oz daily    No OB history provided.  Review of Systems  Constitutional: Negative for fever, chills, appetite change and fatigue.  HENT: Positive for sore throat. Negative for congestion, sinus pressure and ear discharge.   Eyes: Negative for discharge.  Respiratory: Positive for shortness of breath. Negative for cough.   Cardiovascular: Negative for chest pain.  Gastrointestinal: Positive for nausea and vomiting. Negative for abdominal pain and diarrhea.  Genitourinary: Negative for frequency and hematuria.  Musculoskeletal: Negative for back pain.  Skin: Negative for rash.  Neurological: Positive for headaches. Negative for seizures.  Psychiatric/Behavioral: Negative for hallucinations.    Allergies  Erythromycin  Home Medications   Current Outpatient Rx  Name  Route  Sig  Dispense  Refill  . acetaminophen (TYLENOL) 500 MG tablet   Oral   Take 1,000 mg by mouth every 6 (six) hours as needed for pain.         Marland Kitchen doxycycline (VIBRAMYCIN) 100 MG capsule   Oral   Take 1 capsule (100 mg total) by mouth 2 (two) times daily.   14 capsule   0   . HYDROcodone-acetaminophen (NORCO) 7.5-325 MG per tablet   Oral   Take 1 tablet by mouth every 4 (four) hours as needed for pain.   20 tablet   0   . HYDROcodone-acetaminophen (NORCO/VICODIN) 5-325 MG per tablet   Oral   Take 1 tablet by mouth every  6 (six) hours as needed for pain.         Marland Kitchen ibuprofen (ADVIL,MOTRIN) 800 MG tablet   Oral   Take 1 tablet (800 mg total) by mouth 3 (three) times daily.   21 tablet   0   . ondansetron (ZOFRAN ODT) 4 MG disintegrating tablet   Oral   Take 1 tablet (4 mg total) by mouth every 8 (eight) hours as needed for nausea.   20 tablet   0   . oxyCODONE-acetaminophen (PERCOCET) 5-325 MG per tablet   Oral   Take 2 tablets by mouth every 8 (eight) hours as needed for pain.   10 tablet   0   . sulfamethoxazole-trimethoprim (BACTRIM DS) 800-160 MG per tablet    Oral   Take 1 tablet by mouth 2 (two) times daily.           Triage Vitals: BP 186/94  Pulse 97  Temp(Src) 98.5 F (36.9 C) (Oral)  Resp 18  Ht 5\' 6"  (1.676 m)  Wt 180 lb (81.647 kg)  BMI 29.07 kg/m2  SpO2 100%  LMP 12/14/2012  Physical Exam  Nursing note and vitals reviewed. Constitutional: She is oriented to person, place, and time. She appears well-developed and well-nourished.  HENT:  Head: Normocephalic and atraumatic.  Mouth/Throat: Oropharynx is clear and moist.  Hoarse voice  Eyes: Conjunctivae and EOM are normal. No scleral icterus.  Neck: Neck supple. No thyromegaly present.  Cardiovascular: Normal rate and regular rhythm.  Exam reveals no gallop and no friction rub.   No murmur heard. Pulmonary/Chest: Effort normal and breath sounds normal. No stridor. She has no wheezes. She has no rales. She exhibits no tenderness.  Abdominal: Soft. She exhibits no distension. There is no tenderness. There is no rebound.  Musculoskeletal: Normal range of motion. She exhibits no edema.  Lymphadenopathy:    She has no cervical adenopathy.  Neurological: She is oriented to person, place, and time. Coordination normal.  Skin: No rash noted. No erythema.  Psychiatric: She has a normal mood and affect. Her behavior is normal.    ED Course  Procedures (including critical care time)  DIAGNOSTIC STUDIES: Oxygen Saturation is 100% on room air, normal by my interpretation.    COORDINATION OF CARE: 3:35 PM-Discussed treatment plan which includes CBC panel, BMP and rapid strep with pt at bedside and pt agreed to plan.   Labs Reviewed  RAPID STREP SCREEN  CBC WITH DIFFERENTIAL  BASIC METABOLIC PANEL   No results found.   No diagnosis found.    MDM      The chart was scribed for me under my direct supervision.  I personally performed the history, physical, and medical decision making and all procedures in the evaluation of this patient.[name],  MD 01/04/13 [phone]
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
## 2013-01-07 ENCOUNTER — Emergency Department (HOSPITAL_COMMUNITY)
Admission: EM | Admit: 2013-01-07 | Discharge: 2013-01-07 | Disposition: A | Discharge status: Home / Self Care | Payer: Self-pay | Attending: Emergency Medicine | Admitting: Emergency Medicine

## 2013-01-07 ENCOUNTER — Encounter (HOSPITAL_COMMUNITY): Payer: Self-pay | Admitting: Emergency Medicine

## 2013-01-07 DIAGNOSIS — J45909 Unspecified asthma, uncomplicated: Secondary | ICD-10-CM | POA: Insufficient documentation

## 2013-01-07 DIAGNOSIS — F17200 Nicotine dependence, unspecified, uncomplicated: Secondary | ICD-10-CM | POA: Insufficient documentation

## 2013-01-07 DIAGNOSIS — T7492XA Unspecified child maltreatment, confirmed, initial encounter: Secondary | ICD-10-CM | POA: Insufficient documentation

## 2013-01-07 DIAGNOSIS — R109 Unspecified abdominal pain: Secondary | ICD-10-CM | POA: Insufficient documentation

## 2013-01-07 DIAGNOSIS — I10 Essential (primary) hypertension: Secondary | ICD-10-CM | POA: Insufficient documentation

## 2013-01-07 DIAGNOSIS — R112 Nausea with vomiting, unspecified: Secondary | ICD-10-CM | POA: Insufficient documentation

## 2013-01-07 DIAGNOSIS — IMO0002 Reserved for concepts with insufficient information to code with codable children: Secondary | ICD-10-CM | POA: Insufficient documentation

## 2013-01-07 DIAGNOSIS — R51 Headache: Secondary | ICD-10-CM

## 2013-01-07 DIAGNOSIS — T7491XA Unspecified adult maltreatment, confirmed, initial encounter: Secondary | ICD-10-CM | POA: Insufficient documentation

## 2013-01-07 DIAGNOSIS — S0990XA Unspecified injury of head, initial encounter: Secondary | ICD-10-CM | POA: Insufficient documentation

## 2013-01-07 MED ORDER — HYDROCODONE-ACETAMINOPHEN 5-325 MG PO TABS
1.0000 | ORAL_TABLET | Freq: Once | ORAL | Status: AC
  Administered 2013-01-07: 1 via ORAL
  Filled 2013-01-07: qty 1

## 2013-01-07 MED ORDER — ALUM & MAG HYDROXIDE-SIMETH 200-200-20 MG/5ML PO SUSP
15.0000 mL | Freq: Once | ORAL | Status: AC
  Administered 2013-01-07: 15 mL via ORAL
  Filled 2013-01-07: qty 30

## 2013-01-07 MED ORDER — ONDANSETRON 8 MG PO TBDP
8.0000 mg | ORAL_TABLET | Freq: Once | ORAL | Status: AC
  Administered 2013-01-07: 8 mg via ORAL
  Filled 2013-01-07: qty 1

## 2013-01-07 MED ORDER — FAMOTIDINE 20 MG PO TABS
20.0000 mg | ORAL_TABLET | Freq: Once | ORAL | Status: AC
  Administered 2013-01-07: 20 mg via ORAL
  Filled 2013-01-07: qty 1
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
## 2013-02-11 ENCOUNTER — Emergency Department (HOSPITAL_COMMUNITY)
Admission: EM | Admit: 2013-02-11 | Discharge: 2013-02-11 | Disposition: A | Discharge status: Home / Self Care | Payer: Self-pay | Attending: Emergency Medicine | Admitting: Emergency Medicine

## 2013-02-11 ENCOUNTER — Other Ambulatory Visit: Payer: Self-pay

## 2013-02-11 ENCOUNTER — Emergency Department (HOSPITAL_COMMUNITY): Payer: Self-pay

## 2013-02-11 ENCOUNTER — Encounter (HOSPITAL_COMMUNITY): Payer: Self-pay | Admitting: *Deleted

## 2013-02-11 DIAGNOSIS — R0789 Other chest pain: Secondary | ICD-10-CM | POA: Insufficient documentation

## 2013-02-11 DIAGNOSIS — Z3202 Encounter for pregnancy test, result negative: Secondary | ICD-10-CM | POA: Insufficient documentation

## 2013-02-11 DIAGNOSIS — J45909 Unspecified asthma, uncomplicated: Secondary | ICD-10-CM | POA: Insufficient documentation

## 2013-02-11 DIAGNOSIS — I10 Essential (primary) hypertension: Secondary | ICD-10-CM | POA: Insufficient documentation

## 2013-02-11 DIAGNOSIS — F17200 Nicotine dependence, unspecified, uncomplicated: Secondary | ICD-10-CM | POA: Insufficient documentation

## 2013-02-11 DIAGNOSIS — J3489 Other specified disorders of nose and nasal sinuses: Secondary | ICD-10-CM | POA: Insufficient documentation

## 2013-02-11 DIAGNOSIS — H81399 Other peripheral vertigo, unspecified ear: Secondary | ICD-10-CM | POA: Insufficient documentation

## 2013-02-11 DIAGNOSIS — Z8669 Personal history of other diseases of the nervous system and sense organs: Secondary | ICD-10-CM | POA: Insufficient documentation

## 2013-02-11 HISTORY — DX: Headache, unspecified: R51.9

## 2013-02-11 HISTORY — DX: Headache: R51

## 2013-02-11 LAB — URINALYSIS, ROUTINE W REFLEX MICROSCOPIC
Bilirubin Urine: NEGATIVE
Ketones, ur: NEGATIVE mg/dL
Leukocytes, UA: NEGATIVE
Nitrite: NEGATIVE
Protein, ur: NEGATIVE mg/dL
Urobilinogen, UA: 0.2 mg/dL (ref 0.0–1.0)

## 2013-02-11 LAB — CBC WITH DIFFERENTIAL/PLATELET
Basophils Absolute: 0 10*3/uL (ref 0.0–0.1)
Basophils Relative: 0 % (ref 0–1)
Eosinophils Absolute: 0.2 10*3/uL (ref 0.0–0.7)
Hemoglobin: 12.5 g/dL (ref 12.0–15.0)
MCH: 27.5 pg (ref 26.0–34.0)
MCHC: 33.4 g/dL (ref 30.0–36.0)
Monocytes Absolute: 1 10*3/uL (ref 0.1–1.0)
Monocytes Relative: 12 % (ref 3–12)
Neutrophils Relative %: 58 % (ref 43–77)
RDW: 15.8 % — ABNORMAL HIGH (ref 11.5–15.5)

## 2013-02-11 LAB — BASIC METABOLIC PANEL
BUN: 11 mg/dL (ref 6–23)
Creatinine, Ser: 1.02 mg/dL (ref 0.50–1.10)
GFR calc non Af Amer: 68 mL/min — ABNORMAL LOW (ref >90)
Glucose, Bld: 91 mg/dL (ref 70–99)
Potassium: 4.1 mEq/L (ref 3.5–5.1)

## 2013-02-11 LAB — URINE MICROSCOPIC-ADD ON

## 2013-02-11 MED ORDER — MECLIZINE HCL 25 MG PO TABS: 25.0000 mg | ORAL_TABLET | Freq: Three times a day (TID) | ORAL | Status: DC | PRN

## 2013-02-11 MED ORDER — MECLIZINE HCL 12.5 MG PO TABS
25.0000 mg | ORAL_TABLET | Freq: Once | ORAL | Status: AC
  Administered 2013-02-11: 25 mg via ORAL
  Filled 2013-02-11: qty 2

## 2013-02-11 MED ORDER — SODIUM CHLORIDE 0.9 % IV BOLUS (SEPSIS)
1000.0000 mL | Freq: Once | INTRAVENOUS | Status: AC
  Administered 2013-02-11: 1000 mL via INTRAVENOUS

## 2013-02-11 MED ORDER — SODIUM CHLORIDE 0.9 % IV SOLN: INTRAVENOUS | Status: DC

## 2013-02-11 NOTE — ED Provider Notes (Signed)
History     CSN: [id number]  Arrival date & time 02/11/13  1856   First MD Initiated Contact with Patient 02/11/13 1935      Chief Complaint  Patient presents with  . Dizziness  . Chest Pain     HPI Pt was seen at 1940.  Per pt, c/o gradual onset and persistence of constant "dizziness" for the past 3 days. Describes her dizziness as "everything is moving and spinning around." Symptoms worsen with body position changes and movement of her head/eyes.  Has been associated with vague constant generalized chest "aching" pain for the past 3 days. States her symptoms began "since I've been moving heavy boxes outside in the heat." Denies palpitations, no SOB/cough, no abd pain, no N/V/D, no focal motor weakness, no tingling/numbness in extremities.    Past Medical History  Diagnosis Date  . Asthma   . Hypertension   . Bronchitis   . Headache     Past Surgical History  Procedure Laterality Date  . Leg surgery    . Cesarean section        History  Substance Use Topics  . Smoking status: Current Every Day Smoker    Types: Cigarettes  . Smokeless tobacco: Not on file  . Alcohol Use: Yes     Comment: 2 40 oz daily      Review of Systems ROS: Statement: All systems negative except as marked or noted in the HPI; Constitutional: Negative for fever and chills. ; ; Eyes: Negative for eye pain, redness and discharge. ; ; ENMT: Negative for ear pain, hoarseness, nasal congestion, sinus pressure and sore throat. ; ; Cardiovascular: +CP. Negative for palpitations, diaphoresis, dyspnea and peripheral edema. ; ; Respiratory: Negative for cough, wheezing and stridor. ; ; Gastrointestinal: Negative for nausea, vomiting, diarrhea, abdominal pain, blood in stool, hematemesis, jaundice and rectal bleeding. . ; ; Genitourinary: Negative for dysuria, flank pain and hematuria. ; ; Musculoskeletal: Negative for back pain and neck pain. Negative for swelling and trauma.; ; Skin: Negative for pruritus,  rash, abrasions, blisters, bruising and skin lesion.; ; Neuro: +"dizziness." Negative for headache, lightheadedness and neck stiffness. Negative for weakness, altered level of consciousness , altered mental status, extremity weakness, paresthesias, involuntary movement, seizure and syncope.       Allergies  Erythromycin  Home Medications   Current Outpatient Rx  Name  Route  Sig  Dispense  Refill  . HYDROcodone-acetaminophen (NORCO/VICODIN) 5-325 MG per tablet   Oral   Take 1 tablet by mouth once.         Marland Kitchen ibuprofen (ADVIL,MOTRIN) 200 MG tablet   Oral   Take 400 mg by mouth every 6 (six) hours as needed for pain.           BP 182/88  Pulse 90  Temp(Src) 98 F (36.7 C) (Oral)  Resp 21  Ht 5\' 6"  (1.676 m)  Wt 170 lb (77.111 kg)  BMI 27.45 kg/m2  SpO2 100%  LMP 01/18/2013  Physical Exam 1945: Physical examination:  Nursing notes reviewed; Vital signs and O2 SAT reviewed;  Constitutional: Well developed, Well nourished, Well hydrated, In no acute distress; Head:  Normocephalic, atraumatic; Eyes: EOMI, PERRL, No scleral icterus; ENMT: TM's clear bilat. +edemetous nasal turbinates bilat with clear rhinorrhea. Mouth and pharynx normal, Mucous membranes moist; Neck: Supple, Full range of motion, No lymphadenopathy; Cardiovascular: Regular rate and rhythm, No murmur, rub, or gallop; Respiratory: Breath sounds clear & equal bilaterally, No rales, rhonchi, wheezes.  Speaking full  sentences with ease, Normal respiratory effort/excursion; Chest: Nontender, Movement normal; Abdomen: Soft, Nontender, Nondistended, Normal bowel sounds; Genitourinary: No CVA tenderness; Extremities: Pulses normal, No tenderness, No edema, No calf edema or asymmetry.; Neuro: AA&Ox3, Major CN grossly intact.  Strength 5/5 equal bilat UE's and LE's.  DTR 2/4 equal bilat UE's and LE's. No gross sensory deficits. Normal cerebellar testing bilat UE's (finger-nose) and LE's (heel-shin). Speech clear. Appears to have  left facial droop, but when talks and smiles, face is symmetrical with equal eyebrow raises. +left horizontal gaze fatigable nystagmus which reproduces pt's symptoms.;; Skin: Color normal, Warm, Dry.   ED Course  Procedures    MDM  MDM Reviewed: previous chart, nursing note and vitals Reviewed previous: ECG and labs Interpretation: ECG, labs, x-ray and CT scan    Date: 02/11/2013  Rate: 87  Rhythm: normal sinus rhythm  QRS Axis: normal  Intervals: normal  ST/T Wave abnormalities: normal  Conduction Disutrbances:none  Narrative Interpretation:   Old EKG Reviewed: unchanged; no significant changes from previous EKG dated 11/04/2012.  Results for orders placed during the hospital encounter of 02/11/13  URINALYSIS, ROUTINE W REFLEX MICROSCOPIC      Result Value Range   Color, Urine YELLOW  YELLOW   APPearance CLEAR  CLEAR   Specific Gravity, Urine >1.030 (*) 1.005 - 1.030   pH 6.0  5.0 - 8.0   Glucose, UA NEGATIVE  NEGATIVE mg/dL   Hgb urine dipstick LARGE (*) NEGATIVE   Bilirubin Urine NEGATIVE  NEGATIVE   Ketones, ur NEGATIVE  NEGATIVE mg/dL   Protein, ur NEGATIVE  NEGATIVE mg/dL   Urobilinogen, UA 0.2  0.0 - 1.0 mg/dL   Nitrite NEGATIVE  NEGATIVE   Leukocytes, UA NEGATIVE  NEGATIVE  PREGNANCY, URINE      Result Value Range   Preg Test, Ur NEGATIVE  NEGATIVE  BASIC METABOLIC PANEL      Result Value Range   Sodium 133 (*) 135 - 145 mEq/L   Potassium 4.1  3.5 - 5.1 mEq/L   Chloride 98  96 - 112 mEq/L   CO2 23  19 - 32 mEq/L   Glucose, Bld 91  70 - 99 mg/dL   BUN 11  6 - 23 mg/dL   Creatinine, Ser 1.61  0.50 - 1.10 mg/dL   Calcium 9.4  8.4 - 09.6 mg/dL   GFR calc non Af Amer 68 (*) >90 mL/min   GFR calc Af Amer 79 (*) >90 mL/min  CBC WITH DIFFERENTIAL      Result Value Range   WBC 8.4  4.0 - 10.5 K/uL   RBC 4.55  3.87 - 5.11 MIL/uL   Hemoglobin 12.5  12.0 - 15.0 g/dL   HCT 04.5  40.9 - 81.1 %   MCV 82.2  78.0 - 100.0 fL   MCH 27.5  26.0 - 34.0 pg   MCHC 33.4   30.0 - 36.0 g/dL   RDW 91.4 (*) 78.2 - 95.6 %   Platelets 241  150 - 400 K/uL   Neutrophils Relative % 58  43 - 77 %   Neutro Abs 4.8  1.7 - 7.7 K/uL   Lymphocytes Relative 28  12 - 46 %   Lymphs Abs 2.3  0.7 - 4.0 K/uL   Monocytes Relative 12  3 - 12 %   Monocytes Absolute 1.0  0.1 - 1.0 K/uL   Eosinophils Relative 2  0 - 5 %   Eosinophils Absolute 0.2  0.0 - 0.7 K/uL  Basophils Relative 0  0 - 1 %   Basophils Absolute 0.0  0.0 - 0.1 K/uL  TROPONIN I      Result Value Range   Troponin I <0.30  <0.30 ng/mL  URINE MICROSCOPIC-ADD ON      Result Value Range   Squamous Epithelial / LPF FEW (*) RARE   WBC, UA 0-2  <3 WBC/hpf   RBC / HPF 3-6  <3 RBC/hpf   Bacteria, UA FEW (*) RARE   Dg Chest 2 View 02/11/2013   *RADIOLOGY REPORT*  Clinical Data: Dizziness, shortness of breath, chest pain, weakness.  CHEST - 2 VIEW  Comparison: 11/04/2012  Findings: Heart size upper limits normal, stable.  Lungs clear.  No effusion.  Regional bones unremarkable.  IMPRESSION:  No acute disease   Original Report Authenticated By: D. Andria Rhein, MD   Ct Head Wo Contrast 02/11/2013   *RADIOLOGY REPORT*  Clinical Data:  Intermittent dizziness.  CT HEAD WITHOUT CONTRAST  Technique: Contiguous axial images were obtained from the base of the skull through the vertex without contrast.  Comparison: None.  Findings: Normal appearing cerebral hemispheres and posterior fossa structures.  Normal size and position of the ventricles.  No intracranial hemorrhage, mass lesion or evidence of acute infarction.  Unremarkable bones and included portions of the paranasal sinuses.  IMPRESSION: Normal examination.   Original Report Authenticated By: Beckie Salts, M.D.    2215:  Pt states she "feels better" after the meds and wants to go home now. Pt has ambulated in the ED with steady gait, easy resps. Pt is not orthostatic. Doubt PE as cause for symptoms with low risk [place].  Doubt ACS as cause for symptoms with normal troponin and  unchanged EKG from previous after 3 days of constant symptoms. Doubt CVA as cause for symptoms given normal CT head after 3 days of symptoms and intact neuro exam. Dx and testing d/w pt and family.  Questions answered.  Verb understanding, agreeable to d/c home with outpt f/u.            Laray Anger, DO 02/14/13 [phone]

## 2013-02-11 NOTE — ED Notes (Signed)
Patient states that she feels better at this time

## 2013-02-11 NOTE — ED Notes (Signed)
Patient states that she has a little bit of dizziness with ambulation, but it is not as bad as it was.

## 2013-02-11 NOTE — ED Notes (Signed)
Pt c/o intermittent chest pain and lightheadedness when she stands up this morning.

## 2013-04-07 ENCOUNTER — Emergency Department (HOSPITAL_COMMUNITY)
Admission: EM | Admit: 2013-04-07 | Discharge: 2013-04-07 | Disposition: A | Discharge status: Home / Self Care | Payer: Self-pay | Attending: Emergency Medicine | Admitting: Emergency Medicine

## 2013-04-07 ENCOUNTER — Encounter (HOSPITAL_COMMUNITY): Payer: Self-pay | Admitting: *Deleted

## 2013-04-07 ENCOUNTER — Emergency Department (HOSPITAL_COMMUNITY): Payer: Self-pay

## 2013-04-07 DIAGNOSIS — R0789 Other chest pain: Secondary | ICD-10-CM | POA: Insufficient documentation

## 2013-04-07 DIAGNOSIS — R110 Nausea: Secondary | ICD-10-CM | POA: Insufficient documentation

## 2013-04-07 DIAGNOSIS — R0602 Shortness of breath: Secondary | ICD-10-CM | POA: Diagnosis present

## 2013-04-07 DIAGNOSIS — Z8709 Personal history of other diseases of the respiratory system: Secondary | ICD-10-CM | POA: Insufficient documentation

## 2013-04-07 DIAGNOSIS — G43909 Migraine, unspecified, not intractable, without status migrainosus: Secondary | ICD-10-CM | POA: Diagnosis present

## 2013-04-07 DIAGNOSIS — J45909 Unspecified asthma, uncomplicated: Secondary | ICD-10-CM | POA: Insufficient documentation

## 2013-04-07 DIAGNOSIS — Z9889 Other specified postprocedural states: Secondary | ICD-10-CM | POA: Insufficient documentation

## 2013-04-07 DIAGNOSIS — I10 Essential (primary) hypertension: Secondary | ICD-10-CM | POA: Insufficient documentation

## 2013-04-07 DIAGNOSIS — F17200 Nicotine dependence, unspecified, uncomplicated: Secondary | ICD-10-CM | POA: Insufficient documentation

## 2013-04-07 DIAGNOSIS — R42 Dizziness and giddiness: Secondary | ICD-10-CM | POA: Insufficient documentation

## 2013-04-07 LAB — BASIC METABOLIC PANEL
BUN: 12 mg/dL (ref 6–23)
CO2: 24 mEq/L (ref 19–32)
Calcium: 9.4 mg/dL (ref 8.4–10.5)
GFR calc non Af Amer: 61 mL/min — ABNORMAL LOW (ref >90)
Glucose, Bld: 85 mg/dL (ref 70–99)
Potassium: 4.2 mEq/L (ref 3.5–5.1)
Sodium: 138 mEq/L (ref 135–145)

## 2013-04-07 LAB — CBC WITH DIFFERENTIAL/PLATELET
Eosinophils Absolute: 0.3 10*3/uL (ref 0.0–0.7)
Eosinophils Relative: 4 % (ref 0–5)
Hemoglobin: 12.5 g/dL (ref 12.0–15.0)
Lymphocytes Relative: 21 % (ref 12–46)
Lymphs Abs: 1.6 10*3/uL (ref 0.7–4.0)
MCH: 27.5 pg (ref 26.0–34.0)
MCV: 83.7 fL (ref 78.0–100.0)
Monocytes Relative: 10 % (ref 3–12)
RBC: 4.55 MIL/uL (ref 3.87–5.11)
WBC: 7.7 10*3/uL (ref 4.0–10.5)

## 2013-04-07 MED ORDER — DEXAMETHASONE SODIUM PHOSPHATE 10 MG/ML IJ SOLN
10.0000 mg | Freq: Once | INTRAMUSCULAR | Status: AC
  Administered 2013-04-07: 10 mg via INTRAVENOUS
  Filled 2013-04-07: qty 1

## 2013-04-07 MED ORDER — ALBUTEROL SULFATE HFA 108 (90 BASE) MCG/ACT IN AERS
2.0000 | INHALATION_SPRAY | Freq: Once | RESPIRATORY_TRACT | Status: DC
  Filled 2013-04-07: qty 6.7

## 2013-04-07 MED ORDER — DIPHENHYDRAMINE HCL 50 MG/ML IJ SOLN
25.0000 mg | Freq: Once | INTRAMUSCULAR | Status: AC
  Administered 2013-04-07: 25 mg via INTRAVENOUS
  Filled 2013-04-07: qty 1

## 2013-04-07 MED ORDER — PROCHLORPERAZINE EDISYLATE 5 MG/ML IJ SOLN
10.0000 mg | Freq: Once | INTRAMUSCULAR | Status: AC
  Administered 2013-04-07: 10 mg via INTRAVENOUS
  Filled 2013-04-07: qty 2

## 2013-04-07 MED ORDER — SODIUM CHLORIDE 0.9 % IV BOLUS (SEPSIS)
1000.0000 mL | INTRAVENOUS | Status: AC
  Administered 2013-04-07: 1000 mL via INTRAVENOUS
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
## 2013-04-07 NOTE — Progress Notes (Signed)
ED/CM noted that pt did not have a PCP, nor insurance listed in the computer. Pt was given the [facility] handout concerning this. Pt was very appreciative of this

## 2013-04-07 NOTE — ED Notes (Signed)
Headache, sob, chest pain since yesterday.

## 2013-04-07 NOTE — ED Notes (Signed)
RN at bedside while RT admin inhaler.

## 2013-04-07 NOTE — ED Provider Notes (Signed)
CSN: [id number]     Arrival date & time 04/07/13  1353 History  This chart was scribed for Junius Argyle, MD by Bennett Scrape, ED Scribe. This patient was seen in room APA08/APA08 and the patient's care was started at 2:45 PM.   Chief Complaint  Patient presents with  . Headache    Patient is a 42 y.o. female presenting with headaches. The history is provided by the patient. No language interpreter was used.  Headache Pain location:  Generalized Radiates to:  L neck and R neck Severity currently:  9/10 Onset quality:  Gradual Duration:  1 day Timing:  Constant Chronicity:  Recurrent Similar to prior headaches: yes   Context comment:  At rest Exacerbated by: standing. Ineffective treatments:  Acetaminophen Associated symptoms: dizziness and nausea   Associated symptoms: no abdominal pain, no congestion, no cough, no diarrhea, no pain, no fatigue, no fever, no neck pain and no vomiting     HPI Comments: Kristin Carson is a 42 y.o. female who presents to the Emergency Department complaining of HA that radiates down the posterior neck with associated nausea and dizziness with standing that started yesterday. She states that she was sitting around at her house when the HA started gradually and has been gradually worsening since the onset. She rates her pain a 9 out of 10 currently. She has tried taking ibuprofen with no improvement. She states that the HA is aggravated with standing and better with rest. She reports a h/o frequent HAs, at baseline having 3 per week, and reports similarities. She reports chest tightness and SOB from asthma. She denies that she has an inhaler at home. She denies having a h/o DVT or PE, prior or recent CA diagnoses and recent abdominal surgeries.She denies emesis, fevers and She is a current everyday smoker and occasional marijuana and alcohol user.   Past Medical History  Diagnosis Date  . Asthma   . Hypertension   . Bronchitis   . Headache    Past  Surgical History  Procedure Laterality Date  . Leg surgery    . Cesarean section    . Breast surgery     History reviewed. No pertinent family history.  History  Substance Use Topics  . Smoking status: Current Every Day Smoker    Types: Cigarettes  . Smokeless tobacco: Not on file  . Alcohol Use: Yes     Comment: 2 40 oz daily   No OB history provided.  Review of Systems  Constitutional: Negative for fever and fatigue.  HENT: Negative for congestion, drooling and neck pain.   Eyes: Negative for pain and visual disturbance.  Respiratory: Positive for chest tightness and shortness of breath. Negative for cough.   Cardiovascular: Negative for chest pain.  Gastrointestinal: Positive for nausea. Negative for vomiting, abdominal pain and diarrhea.  Genitourinary: Negative for dysuria and hematuria.  Skin: Negative for color change.  Neurological: Positive for dizziness and headaches.  Hematological: Negative for adenopathy.  Psychiatric/Behavioral: Negative for behavioral problems.  All other systems reviewed and are negative.    Allergies  Erythromycin  Home Medications   Current Outpatient Rx  Name  Route  Sig  Dispense  Refill  . ibuprofen (ADVIL,MOTRIN) 200 MG tablet   Oral   Take 400 mg by mouth every 6 (six) hours as needed for pain.          Triage Vitals: BP 155/88  Pulse 106  Temp(Src) 98.5 F (36.9 C) (Oral)  Resp 20  Ht 5\' 6"  (1.676 m)  Wt 170 lb (77.111 kg)  BMI 27.45 kg/m2  SpO2 100%  LMP 04/01/2013  Physical Exam  Nursing note and vitals reviewed. Constitutional: She is oriented to person, place, and time. She appears well-developed and well-nourished. No distress.  HENT:  Head: Normocephalic and atraumatic.  Eyes: Conjunctivae and EOM are normal.  Neck: Normal range of motion. Neck supple. No tracheal deviation present.  No meningismus   Cardiovascular: Normal rate, regular rhythm and normal heart sounds.   No murmur  heard. Pulmonary/Chest: Effort normal and breath sounds normal. No respiratory distress. She has no wheezes. She has no rales.  Abdominal: Soft. Bowel sounds are normal. There is no tenderness.  Musculoskeletal: Normal range of motion. She exhibits no edema.  Neurological: She is alert and oriented to person, place, and time. No cranial nerve deficit.  Gait is normal. Normal finger to nose. Negative pronator's drift. Negative Romberg's   Skin: Skin is warm and dry.  Psychiatric: She has a normal mood and affect. Her behavior is normal.    ED Course   Procedures (including critical care time)  DIAGNOSTIC STUDIES: Oxygen Saturation is 100% on room air, normal by my interpretation.    COORDINATION OF CARE: 2:54 PM-Discussed treatment plan which includes (CXR, CBC panel, CMP, UA) with pt at bedside and pt agreed to plan.   Labs Reviewed  CBC WITH DIFFERENTIAL - Abnormal; Notable for the following:    RDW 16.8 (*)    All other components within normal limits  BASIC METABOLIC PANEL - Abnormal; Notable for the following:    Creatinine, Ser 1.12 (*)    GFR calc non Af Amer 61 (*)    GFR calc Af Amer 70 (*)    All other components within normal limits  TROPONIN I   Dg Chest 2 View  04/07/2013   *RADIOLOGY REPORT*  Clinical Data: Pain and shortness of breath  CHEST - 2 VIEW  Comparison: February 11, 2013  Findings:  Lungs clear.  Heart is borderline prominent with normal pulmonary vascularity.  No adenopathy.  No pneumothorax.  No bone lesions.  IMPRESSION: Heart borderline prominent.  No edema or consolidation.   Original Report Authenticated By: Bretta Bang, M.D.   1. Migraine   2. SOB (shortness of breath)      Date: 04/07/2013  Rate: 77  Rhythm: normal sinus rhythm  QRS Axis: normal  Intervals: normal  ST/T Wave abnormalities: nonspecific T wave changes  Conduction Disutrbances:none  Narrative Interpretation: Isolated biphasic t wave in III, No new ST or T wave changes cw  ischemia  Old EKG Reviewed: changes noted   MDM  5:00 PM 42 y.o. female w hx of HTN who pw HA cw her migraines. Also mild chest tightness and sob which pt states is cw her asthma. No appreciable wheezing, but will get albuterol. Will get migraine cocktail labs.   5:05 PM: Pt notes HA now gone as well as her other assoc sx. Low risk per HEART score and cp not cw cardiac pain any way.  I have discussed the diagnosis/risks/treatment options with the patient and believe the pt to be eligible for discharge home to follow-up with pcp as needed. We also discussed returning to the ED immediately if new or worsening sx occur. We discussed the sx which are most concerning (e.g., worsening sob, cp, HA, fever) that necessitate immediate return. Any new prescriptions provided to the patient are listed below.  New Prescriptions   No medications  on file     I personally performed the services described in this documentation, which was scribed in my presence. The recorded information has been reviewed and is accurate.     Junius Argyle, MD 04/08/13 [phone]

## 2013-05-03 ENCOUNTER — Emergency Department (HOSPITAL_COMMUNITY): Payer: Self-pay

## 2013-05-03 ENCOUNTER — Emergency Department (HOSPITAL_COMMUNITY)
Admission: EM | Admit: 2013-05-03 | Discharge: 2013-05-03 | Disposition: A | Discharge status: Home / Self Care | Payer: Self-pay | Attending: Emergency Medicine | Admitting: Emergency Medicine

## 2013-05-03 ENCOUNTER — Encounter (HOSPITAL_COMMUNITY): Payer: Self-pay | Admitting: *Deleted

## 2013-05-03 ENCOUNTER — Emergency Department (HOSPITAL_COMMUNITY)
Admission: EM | Admit: 2013-05-03 | Discharge: 2013-05-03 | Disposition: A | Discharge status: Home / Self Care | Payer: Self-pay

## 2013-05-03 DIAGNOSIS — I10 Essential (primary) hypertension: Secondary | ICD-10-CM | POA: Insufficient documentation

## 2013-05-03 DIAGNOSIS — T07XXXA Unspecified multiple injuries, initial encounter: Secondary | ICD-10-CM | POA: Insufficient documentation

## 2013-05-03 DIAGNOSIS — Z3202 Encounter for pregnancy test, result negative: Secondary | ICD-10-CM | POA: Insufficient documentation

## 2013-05-03 DIAGNOSIS — J45909 Unspecified asthma, uncomplicated: Secondary | ICD-10-CM | POA: Insufficient documentation

## 2013-05-03 DIAGNOSIS — R21 Rash and other nonspecific skin eruption: Secondary | ICD-10-CM | POA: Insufficient documentation

## 2013-05-03 DIAGNOSIS — Z79899 Other long term (current) drug therapy: Secondary | ICD-10-CM | POA: Insufficient documentation

## 2013-05-03 DIAGNOSIS — F17200 Nicotine dependence, unspecified, uncomplicated: Secondary | ICD-10-CM | POA: Insufficient documentation

## 2013-05-03 DIAGNOSIS — S0990XA Unspecified injury of head, initial encounter: Secondary | ICD-10-CM | POA: Insufficient documentation

## 2013-05-03 LAB — PREGNANCY, URINE: Preg Test, Ur: NEGATIVE

## 2013-05-03 MED ORDER — TRAMADOL HCL 50 MG PO TABS: 50.0000 mg | ORAL_TABLET | Freq: Four times a day (QID) | ORAL | Status: DC | PRN

## 2013-05-03 MED ORDER — NAPROXEN 500 MG PO TABS: 500.0000 mg | ORAL_TABLET | Freq: Two times a day (BID) | ORAL | Status: DC

## 2013-05-03 NOTE — ED Notes (Signed)
Woke pt from sleep to get urine sample.

## 2013-05-03 NOTE — ED Notes (Signed)
Pt's transportation home arrived to the ed. Pt taken to lobby in wheelchair.
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
## 2013-05-03 NOTE — ED Notes (Signed)
Patient resting in bed with eyes closed, arousable. Patient aware of discharge. Patient's mother called per patient's request. Per mother will pick patient up in 1 hour. Patient aware, patient to rest in bed and then be discharged when mother arrives.
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
## 2013-08-06 ENCOUNTER — Emergency Department (HOSPITAL_COMMUNITY)
Admission: EM | Admit: 2013-08-06 | Discharge: 2013-08-06 | Disposition: A | Discharge status: Home / Self Care | Payer: Self-pay | Attending: Emergency Medicine | Admitting: Emergency Medicine

## 2013-08-06 ENCOUNTER — Encounter (HOSPITAL_COMMUNITY): Payer: Self-pay | Admitting: Emergency Medicine

## 2013-08-06 DIAGNOSIS — I10 Essential (primary) hypertension: Secondary | ICD-10-CM | POA: Insufficient documentation

## 2013-08-06 DIAGNOSIS — J069 Acute upper respiratory infection, unspecified: Secondary | ICD-10-CM

## 2013-08-06 DIAGNOSIS — F17200 Nicotine dependence, unspecified, uncomplicated: Secondary | ICD-10-CM | POA: Insufficient documentation

## 2013-08-06 DIAGNOSIS — G43909 Migraine, unspecified, not intractable, without status migrainosus: Secondary | ICD-10-CM

## 2013-08-06 DIAGNOSIS — J45909 Unspecified asthma, uncomplicated: Secondary | ICD-10-CM | POA: Insufficient documentation

## 2013-08-06 MED ORDER — HYDROCODONE-ACETAMINOPHEN 5-325 MG PO TABS
2.0000 | ORAL_TABLET | Freq: Once | ORAL | Status: AC
  Administered 2013-08-06: 2 via ORAL
  Filled 2013-08-06: qty 2

## 2013-08-06 MED ORDER — HYDROCOD POLST-CHLORPHEN POLST 10-8 MG/5ML PO LQCR: 5.0000 mL | Freq: Two times a day (BID) | ORAL | Status: DC | PRN

## 2013-08-06 MED ORDER — ALBUTEROL SULFATE HFA 108 (90 BASE) MCG/ACT IN AERS
2.0000 | INHALATION_SPRAY | RESPIRATORY_TRACT | Status: DC | PRN
  Administered 2013-08-06: 2 via RESPIRATORY_TRACT
  Filled 2013-08-06: qty 6.7

## 2013-08-06 MED ORDER — AMOXICILLIN 500 MG PO CAPS: 500.0000 mg | ORAL_CAPSULE | Freq: Three times a day (TID) | ORAL | Status: DC

## 2013-08-06 NOTE — ED Notes (Addendum)
Pt c/o cough that is non productive, nasal congestion, headache that started a week ago, reports that she has not gotten any better,

## 2013-08-06 NOTE — ED Provider Notes (Signed)
CSN: [id number]     Arrival date & time 08/06/13  1108 History  This chart was scribed for Geoffery Lyons, MD by Bennett Scrape, ED Scribe. This patient was seen in room APA19/APA19 and the patient's care was started at 11:45 AM.    Chief Complaint  Patient presents with  . Cough    The history is provided by the patient. No language interpreter was used.    HPI Comments: Kristin Carson is a 42 y.o. female who presents to the Emergency Department complaining of non-changing NP cough with associated rhinorrhea and sore throat for the past week. She denies any sick contacts at home with similar symptoms. She has a h/o asthma which she treats with an inhaler PRN. She reports that she recently ran out of her inhaler as well.  She has a secondary complaint of a HA that she rates a 10 out of 10. She admits that she has a h/o migraines and states that this one is similar to prior episodes. She reports that she taken Advil and Tylenol at home with no improvement. She reports having a pain medication at home for her migraines but states that she recently ran out and can not remember the name.    Past Medical History  Diagnosis Date  . Asthma   . Hypertension   . Bronchitis   . Headache    Past Surgical History  Procedure Laterality Date  . Leg surgery    . Cesarean section    . Breast surgery     No family history on file. History  Substance Use Topics  . Smoking status: Current Every Day Smoker    Types: Cigarettes  . Smokeless tobacco: Not on file  . Alcohol Use: Yes     Comment: 2 40 oz daily   No OB history provided.  Review of Systems  Constitutional: Negative for fever.  HENT: Positive for rhinorrhea. Negative for ear pain.   Respiratory: Positive for cough.   Neurological: Positive for headaches.  All other systems reviewed and are negative.    Allergies  Erythromycin  Home Medications   Current Outpatient Rx  Name  Route  Sig  Dispense  Refill  . acetaminophen  (TYLENOL) 500 MG tablet   Oral   Take 1,000 mg by mouth every 6 (six) hours as needed for headache.         . ibuprofen (ADVIL,MOTRIN) 200 MG tablet   Oral   Take 400 mg by mouth every 6 (six) hours as needed for headache.           Triage Vitals: LMP 07/16/2013  Physical Exam  Nursing note and vitals reviewed. Constitutional: She is oriented to person, place, and time. She appears well-developed and well-nourished. No distress.  HENT:  Head: Normocephalic and atraumatic.  Mouth/Throat: Oropharynx is clear and moist.  TMs are normal bilaterally  Eyes: Conjunctivae and EOM are normal. Pupils are equal, round, and reactive to light.  Neck: Normal range of motion. Neck supple. No tracheal deviation present.  Cardiovascular: Normal rate, regular rhythm and normal heart sounds.   No murmur heard. Pulmonary/Chest: Effort normal. No respiratory distress. She has no wheezes. She has no rales.  Scattered rhonchi bilaterally   Abdominal: Soft. Bowel sounds are normal. There is no tenderness.  Musculoskeletal: Normal range of motion. She exhibits no edema.  Neurological: She is alert and oriented to person, place, and time. No cranial nerve deficit.  Skin: Skin is warm and dry.  Psychiatric:  She has a normal mood and affect. Her behavior is normal.    ED Course  Procedures (including critical care time)  DIAGNOSTIC STUDIES: None performed     COORDINATION OF CARE: 11:49 AM-Advised pt that her symptoms seem more viral and that further testing is not necessary. Discussed treatment plan which includes medications and prescriptions with pt at bedside and pt agreed to plan.   Labs Review Labs Reviewed - No data to display Imaging Review No results found.    MDM  No diagnosis found. Patient presents with headache and URI symptoms for the past week. She states she is coughing up phlegm and she is a one pack-a-day smoker. Her lungs are noted to have scattered rhonchi, however oxygen  saturations are normal and she is in no respiratory distress. This appears to be a bronchitis which I will treat with amoxicillin and Tussionex. To return as needed if her symptoms worsen or change.   I personally performed the services described in this documentation, which was scribed in my presence. The recorded information has been reviewed and is accurate.      Geoffery Lyons, MD 08/06/13 1321

## 2013-08-16 ENCOUNTER — Emergency Department (HOSPITAL_COMMUNITY)
Admission: EM | Admit: 2013-08-16 | Discharge: 2013-08-16 | Disposition: A | Discharge status: Home / Self Care | Payer: Self-pay | Attending: Emergency Medicine | Admitting: Emergency Medicine

## 2013-08-16 ENCOUNTER — Encounter (HOSPITAL_COMMUNITY): Payer: Self-pay | Admitting: Emergency Medicine

## 2013-08-16 DIAGNOSIS — Z23 Encounter for immunization: Secondary | ICD-10-CM | POA: Insufficient documentation

## 2013-08-16 DIAGNOSIS — Y929 Unspecified place or not applicable: Secondary | ICD-10-CM | POA: Insufficient documentation

## 2013-08-16 DIAGNOSIS — F17200 Nicotine dependence, unspecified, uncomplicated: Secondary | ICD-10-CM | POA: Insufficient documentation

## 2013-08-16 DIAGNOSIS — J45909 Unspecified asthma, uncomplicated: Secondary | ICD-10-CM | POA: Insufficient documentation

## 2013-08-16 DIAGNOSIS — Y9389 Activity, other specified: Secondary | ICD-10-CM | POA: Insufficient documentation

## 2013-08-16 DIAGNOSIS — S61411A Laceration without foreign body of right hand, initial encounter: Secondary | ICD-10-CM

## 2013-08-16 DIAGNOSIS — W260XXA Contact with knife, initial encounter: Secondary | ICD-10-CM | POA: Insufficient documentation

## 2013-08-16 DIAGNOSIS — I10 Essential (primary) hypertension: Secondary | ICD-10-CM | POA: Insufficient documentation

## 2013-08-16 DIAGNOSIS — Z792 Long term (current) use of antibiotics: Secondary | ICD-10-CM | POA: Insufficient documentation

## 2013-08-16 DIAGNOSIS — S61409A Unspecified open wound of unspecified hand, initial encounter: Secondary | ICD-10-CM | POA: Insufficient documentation

## 2013-08-16 MED ORDER — TRAMADOL HCL 50 MG PO TABS: 50.0000 mg | ORAL_TABLET | Freq: Four times a day (QID) | ORAL | Status: DC | PRN

## 2013-08-16 MED ORDER — CEPHALEXIN 500 MG PO CAPS: 500.0000 mg | ORAL_CAPSULE | Freq: Two times a day (BID) | ORAL | Status: DC

## 2013-08-16 MED ORDER — TETANUS-DIPHTH-ACELL PERTUSSIS 5-2.5-18.5 LF-MCG/0.5 IM SUSP
0.5000 mL | Freq: Once | INTRAMUSCULAR | Status: AC
  Administered 2013-08-16: 0.5 mL via INTRAMUSCULAR
  Filled 2013-08-16: qty 0.5

## 2013-08-16 NOTE — ED Notes (Signed)
Lac cleansed , band aid placed.
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
## 2013-08-19 NOTE — ED Provider Notes (Signed)
Medical screening examination/treatment/procedure(s) were performed by non-physician practitioner and as supervising physician I was immediately available for consultation/collaboration.  EKG Interpretation   None        Icis Budreau, MD 08/19/13 1822 

## 2013-10-23 ENCOUNTER — Emergency Department (HOSPITAL_COMMUNITY)
Admission: EM | Admit: 2013-10-23 | Discharge: 2013-10-23 | Disposition: A | Discharge status: Home / Self Care | Payer: Self-pay | Attending: Emergency Medicine | Admitting: Emergency Medicine

## 2013-10-23 ENCOUNTER — Emergency Department (HOSPITAL_COMMUNITY): Payer: Self-pay

## 2013-10-23 ENCOUNTER — Encounter (HOSPITAL_COMMUNITY): Payer: Self-pay | Admitting: Emergency Medicine

## 2013-10-23 DIAGNOSIS — R197 Diarrhea, unspecified: Secondary | ICD-10-CM | POA: Insufficient documentation

## 2013-10-23 DIAGNOSIS — R109 Unspecified abdominal pain: Secondary | ICD-10-CM | POA: Insufficient documentation

## 2013-10-23 DIAGNOSIS — F17200 Nicotine dependence, unspecified, uncomplicated: Secondary | ICD-10-CM | POA: Insufficient documentation

## 2013-10-23 DIAGNOSIS — J45909 Unspecified asthma, uncomplicated: Secondary | ICD-10-CM

## 2013-10-23 DIAGNOSIS — I10 Essential (primary) hypertension: Secondary | ICD-10-CM | POA: Insufficient documentation

## 2013-10-23 DIAGNOSIS — J45901 Unspecified asthma with (acute) exacerbation: Secondary | ICD-10-CM | POA: Insufficient documentation

## 2013-10-23 MED ORDER — PREDNISONE 50 MG PO TABS
60.0000 mg | ORAL_TABLET | Freq: Once | ORAL | Status: AC
  Administered 2013-10-23: 60 mg via ORAL
  Filled 2013-10-23 (×2): qty 1

## 2013-10-23 MED ORDER — IPRATROPIUM-ALBUTEROL 0.5-2.5 (3) MG/3ML IN SOLN
3.0000 mL | Freq: Once | RESPIRATORY_TRACT | Status: AC
  Administered 2013-10-23: 3 mL via RESPIRATORY_TRACT
  Filled 2013-10-23: qty 3

## 2013-10-23 MED ORDER — GUAIFENESIN 100 MG/5ML PO SYRP: 100.0000 mg | ORAL_SOLUTION | ORAL | Status: DC | PRN

## 2013-10-23 MED ORDER — PREDNISONE 10 MG PO TABS: 20.0000 mg | ORAL_TABLET | Freq: Two times a day (BID) | ORAL | Status: DC

## 2013-10-23 MED ORDER — ALBUTEROL SULFATE HFA 108 (90 BASE) MCG/ACT IN AERS
2.0000 | INHALATION_SPRAY | Freq: Once | RESPIRATORY_TRACT | Status: AC
  Administered 2013-10-23: 2 via RESPIRATORY_TRACT
  Filled 2013-10-23: qty 6.7

## 2013-10-23 NOTE — ED Provider Notes (Signed)
CSN: [id number]     Arrival date & time 10/23/13  1542 History   This chart was scribed for [facility] by Lovena Le Day, ED scribe. This patient was seen in room APFT20/APFT20 and the patient's care was started at 1542.  Chief Complaint  Patient presents with  . Cough  . Nasal Congestion  . Generalized Body Aches   The history is provided by the patient. No language interpreter was used.   HPI Comments: Kristin Carson is a 43 y.o. female who presents to the Emergency Department w/hx of asthma complaining of constant, gradually worsening cough/congestion, HA, wheezing, SOB and myalgias, onset yesterday. She reports this feels worse than previous episodes of her asthma flaring up. She reports that her breathing feels tight. She states yesterday ran out of her inhaler medicine at home. She reports associated dry cough, subjective fever, diarrhea (about x4 episodes/day - brown watery stool). She reports also w/right sided flank pain. She denies any urinary sx such as dysuria or frequency. She denies nausea, emesis episodes or abdominal pain.  Past Medical History  Diagnosis Date  . Asthma   . Hypertension   . Bronchitis   . Headache    Past Surgical History  Procedure Laterality Date  . Leg surgery    . Cesarean section    . Breast surgery     History reviewed. No pertinent family history. History  Substance Use Topics  . Smoking status: Current Every Day Smoker    Types: Cigarettes  . Smokeless tobacco: Not on file  . Alcohol Use: Yes     Comment: 2 40 oz daily   OB History   Grav Para Term Preterm Abortions TAB SAB Ect Mult Living                 Review of Systems  Constitutional: Positive for fever. Negative for chills.  HENT: Positive for congestion.   Respiratory: Positive for cough, shortness of breath and wheezing.   Cardiovascular: Negative for chest pain.  Gastrointestinal: Positive for diarrhea. Negative for nausea, vomiting and abdominal pain.  Musculoskeletal:  Negative for back pain.  Skin: Negative for color change.  All other systems reviewed and are negative.   Allergies  Erythromycin  Home Medications   No current outpatient prescriptions on file. Triage Vitals: BP 155/81  Pulse 89  Temp(Src) 98.3 F (36.8 C) (Oral)  Resp 20  Ht 5\' 6"  (1.676 m)  Wt 180 lb (81.647 kg)  BMI 29.07 kg/m2  SpO2 100%  LMP 10/23/2013  Physical Exam  Nursing note and vitals reviewed. Constitutional: She is oriented to person, place, and time. She appears well-developed and well-nourished. No distress.  HENT:  Head: Normocephalic and atraumatic.  Right Ear: External ear normal.  Left Ear: External ear normal.  Multiple dental carries Mild pharyngeal erythema.      Eyes: Conjunctivae are normal. Right eye exhibits no discharge. Left eye exhibits no discharge.  Neck: Normal range of motion.  Small bilateral anterior nodes  Cardiovascular: Normal rate, regular rhythm and normal heart sounds.   No murmur heard. Pulmonary/Chest: Effort normal. No respiratory distress. She has wheezes. She exhibits no tenderness.  Inspiratory and expiratory wheezes Prolonged expirations and decreased air movement  Abdominal: Soft. Bowel sounds are normal. She exhibits no distension. There is no tenderness.  Musculoskeletal: Normal range of motion. She exhibits no edema.  Lymphadenopathy:    She has cervical adenopathy.  Neurological: She is alert and oriented to person, place, and time.  Skin:  Skin is warm and dry.  Psychiatric: She has a normal mood and affect. Thought content normal.   ED Course  Procedures (including critical care time) DIAGNOSTIC STUDIES: Oxygen Saturation is 100% on room air, normal by my interpretation.    COORDINATION OF CARE: At 410 PM Discussed treatment plan with patient which includes CXR, duoneb. Patient agrees.   5:00 PM Recheck: Patient is still with both inspiratory and expiratory wheezing. Will giver her steroids as well as  another breathing tx.  5:21 PM Patient's wheezing has improved.  Her CXR is normal. I will d/c her with an albuterol inhaler. I will also rx w/cough medicine and steroids. Patient is agreeable and understandable.   Labs Review MDM  43 y.o. female with cough, congestion and body ache that started yesterday. Stable for discharge without further screening at this time.  I have reviewed this patient's vital signs, nurses notes, appropriate labs and imaging.  I have discussed findings and plan of care with the patient and she is agreeable.    Medication List         guaifenesin 100 MG/5ML syrup  Commonly known as:  ROBITUSSIN  Take 5-10 mLs (100-200 mg total) by mouth every 4 (four) hours as needed for cough.     predniSONE 10 MG tablet  Commonly known as:  DELTASONE  Take 2 tablets (20 mg total) by mouth 2 (two) times daily with a meal.         I personally performed the services described in this documentation, which was scribed in my presence. The recorded information has been reviewed and is accurate.     [address] [place], [place] 10/24/13 [phone]

## 2013-10-23 NOTE — Discharge Instructions (Signed)
Use a vaporizer to keep the air in your home moist. Take the medications as directed and follow up with your doctor. Return here as needed for worsening symptoms.

## 2013-10-23 NOTE — ED Notes (Signed)
Patient complaining of body aches, cough, congestion, and headache starting yesterday.

## 2013-10-24 NOTE — ED Provider Notes (Signed)
Medical screening examination/treatment/procedure(s) were performed by non-physician practitioner and as supervising physician I was immediately available for consultation/collaboration.  Babette Relic, MD 10/24/13 [phone]

## 2013-12-28 ENCOUNTER — Encounter (HOSPITAL_COMMUNITY): Payer: Self-pay | Admitting: Emergency Medicine

## 2013-12-28 ENCOUNTER — Emergency Department (HOSPITAL_COMMUNITY)
Admission: EM | Admit: 2013-12-28 | Discharge: 2013-12-28 | Disposition: A | Discharge status: Home / Self Care | Payer: Self-pay | Attending: Emergency Medicine | Admitting: Emergency Medicine

## 2013-12-28 DIAGNOSIS — I10 Essential (primary) hypertension: Secondary | ICD-10-CM | POA: Insufficient documentation

## 2013-12-28 DIAGNOSIS — J45909 Unspecified asthma, uncomplicated: Secondary | ICD-10-CM | POA: Insufficient documentation

## 2013-12-28 DIAGNOSIS — K047 Periapical abscess without sinus: Secondary | ICD-10-CM | POA: Insufficient documentation

## 2013-12-28 DIAGNOSIS — F17200 Nicotine dependence, unspecified, uncomplicated: Secondary | ICD-10-CM | POA: Insufficient documentation

## 2013-12-28 DIAGNOSIS — IMO0002 Reserved for concepts with insufficient information to code with codable children: Secondary | ICD-10-CM | POA: Insufficient documentation

## 2013-12-28 DIAGNOSIS — K0381 Cracked tooth: Secondary | ICD-10-CM | POA: Insufficient documentation

## 2013-12-28 DIAGNOSIS — R599 Enlarged lymph nodes, unspecified: Secondary | ICD-10-CM | POA: Insufficient documentation

## 2013-12-28 MED ORDER — HYDROCODONE-ACETAMINOPHEN 5-325 MG PO TABS
2.0000 | ORAL_TABLET | ORAL | Status: DC | PRN
Start: 2013-12-28 — End: 2014-01-23

## 2013-12-28 MED ORDER — KETOROLAC TROMETHAMINE 60 MG/2ML IM SOLN
60.0000 mg | Freq: Once | INTRAMUSCULAR | Status: AC
  Administered 2013-12-28: 60 mg via INTRAMUSCULAR
  Filled 2013-12-28: qty 2

## 2013-12-28 MED ORDER — PENICILLIN V POTASSIUM 500 MG PO TABS: 500.0000 mg | ORAL_TABLET | Freq: Four times a day (QID) | ORAL | Status: DC

## 2013-12-28 MED ORDER — PENICILLIN V POTASSIUM 250 MG PO TABS
500.0000 mg | ORAL_TABLET | Freq: Once | ORAL | Status: AC
  Administered 2013-12-28: 500 mg via ORAL
  Filled 2013-12-28: qty 2

## 2013-12-28 MED ORDER — NAPROXEN 500 MG PO TABS: 500.0000 mg | ORAL_TABLET | Freq: Two times a day (BID) | ORAL | Status: DC

## 2013-12-28 NOTE — Discharge Instructions (Signed)
°Emergency Department Resource Guide °1) Find a Doctor and Pay Out of Pocket °Although you won't have to find out who is covered by your insurance plan, it is a good idea to ask around and get recommendations. You will then need to call the office and see if the doctor you have chosen will accept you as a new patient and what types of options they offer for patients who are self-pay. Some doctors offer discounts or will set up payment plans for their patients who do not have insurance, but you will need to ask so you aren't surprised when you get to your appointment. ° °2) Contact Your Local Health Department °Not all health departments have doctors that can see patients for sick visits, but many do, so it is worth a call to see if yours does. If you don't know where your local health department is, you can check in your phone book. The CDC also has a tool to help you locate your state's health department, and many state websites also have listings of all of their local health departments. ° °3) Find a Walk-in Clinic °If your illness is not likely to be very severe or complicated, you may want to try a walk in clinic. These are popping up all over the country in pharmacies, drugstores, and shopping centers. They're usually staffed by nurse practitioners or physician assistants that have been trained to treat common illnesses and complaints. They're usually fairly quick and inexpensive. However, if you have serious medical issues or chronic medical problems, these are probably not your best option. ° °No Primary Care Doctor: °- Call [facility] at  832-8000 - they can help you locate a primary care doctor that  accepts your insurance, provides certain services, etc. °- Physician Referral Service- 1-800-533-3463 ° °Chronic Pain Problems: °Organization         Address  Phone   Notes  °[facility]  (336) 297-2271 Patients need to be referred by their primary care doctor.  ° °Medication  Assistance: °Organization         Address  Phone   Notes  °[place] [facility] [address] °[place], [place] [address] (336) 641-8030 --Must be a resident of [place] °-- Must have NO insurance coverage whatsoever (no Medicaid/ Medicare, etc.) °-- The pt. MUST have a primary care doctor that directs their care regularly and follows them in the community °  °[facility]  (866) 331-1348   °[employer]  (888) 892-1162   ° °Agencies that provide inexpensive medical care: °Organization         Address  Phone   Notes  °[facility]  (336) 832-8035   °[facility]    (336) 832-7272   °[facility] [address] °[place], [place] [address] (336) 832-4777   °[facility] [address], °[place] (336) 271-4999   °[facility]    (336) 373-0678   °[facility]    (336) 272-1050   °[facility] ° [address], [place] Phone:  (336) 832-4444, Fax:  (336) 832-4440 Hours of Operation:  9 am - 6 pm, M-F.  Also accepts Medicaid/Medicare and self-pay.  °[facility] ° [address], [place] Phone: (336) 832-3150, Fax: (336) [phone]. Hours of Operation:  8:30 am - 5:30 pm, M-F.  Also accepts Medicaid and self-pay.  °[facility] [place] [address], [place] Phone: (336) 878-6027   °[facility] [address], [place], [place] (336)723-1848, Ext. 123 Mondays & Thursdays: 7-9 AM.  First 15 patients are seen on a first come, first serve basis. °  ° °Medicaid-accepting [place] Providers: ° °Organization         Address  Phone   Notes  °[facility] [address], [place] (336) 641-2100 Also accepts self-pay patients.  °[facility] [address], [place] ° (336) 856-9996   °[facility] [address], [place]  (336) 288-8857   °[facility] [address] [place] [address], [place] (336) 299-7000   °Veita Bland [address], [place]  ° (336) 373-1557 Only accepts [place] Access Medicaid patients after they have their name applied to their card.  ° °Self-Pay (no insurance) in [place]: ° °Organization         Address  Phone   Notes  °Sickle Cell Patients, [facility] [address], [place] (336) 832-1970   °[facility] [address], [place] (336) 832-4400   °[facility] [place] ° [address], [place] (336) 992-4800   °[facility]/Dr. Osei-Bonsu ° [address] [place] [address], [place] or [address], [place] (336) 841-8500 Phone number for both [place] and [place] locations is the same.  °[facility] [address], [place] (336) 299-0000   °[facility] [address] [place] [address], [place] or [address] (336) 852-7530 °(336) 878-2260   °[facility] [address], [place] (336) 350-1642, phone; (336) 294-5005, fax Sees patients 1st and 3rd Saturday of every month.  Must not qualify for public or private insurance (i.e. Medicaid, Medicare, [place] Health Choice, Veterans' Benefits) • Household income should be no more than 200% of the poverty level •The clinic cannot treat you if you are pregnant or think you are pregnant • Sexually transmitted diseases are not treated at the clinic.  ° ° °Dental Care: °Organization         Address  Phone  Notes  °[place] [facility] [address], [place] (336) 641-6152 Accepts children up to age 21 who are enrolled in Medicaid or [place] Health Choice; pregnant women with a Medicaid card; and children who have applied for Medicaid or [place] Health Choice, but were declined, whose parents can pay a reduced fee at time of service.  °[place]  [facility] [place]  [address], [place] (336) 641-7733 Accepts children up to age 21 who are enrolled in Medicaid or [place] Health Choice; pregnant women with a Medicaid card; and children who have applied for Medicaid or [place] Health Choice, but were declined, whose parents can pay a reduced fee at time of service.  °[facility] ° [address],  (336) 641-4533 Patients are seen by appointment only. Walk-ins are not accepted. [facility] will see patients 18 years of age and older. °Monday - Tuesday (8am-5pm) °Most Wednesdays (8:30-5pm) °$30 per visit, cash only  °[facility] ° [address], [place] (336) 641-4533 Patients are seen by appointment only. Walk-ins are not accepted. [facility] will see patients 18 years of age and older. °One   Wednesday Evening (Monthly: Volunteer Based).  $30 per visit, cash only  °[facility]  (919) 537-3737 for adults; Children under age 4, call Graduate Pediatric Dentistry at (919) [phone]. Children aged 4-14, please call (919) [phone] to request a pediatric application. ° Dental services are provided in all areas of dental care including fillings, crowns and bridges, complete and partial dentures, implants, gum treatment, root canals, and extractions. Preventive care is also provided. Treatment is provided to both adults and children. °Patients are selected via a lottery and there is often a waiting list. °  °[facility] [address], °[place] ° (336) 763-8833 [email or web address] °  °[facility] [address], [place], [place] (336)723-1848, Ext. 123 Second and Fourth Thursday of each month, opens at 6:30 AM; Clinic ends at 9 AM.  Patients are seen on a first-come first-served basis, and a limited number are seen during each clinic.  ° °[facility] ° [address], [place], [place] (336) 723-7904    Eligibility Requirements °You must have lived in [place], [place], or [place] counties for at least the last three months. °  You cannot be eligible for state or federal sponsored healthcare insurance, including Veterans Administration, Medicaid, or Medicare. °  You generally cannot be eligible for healthcare insurance through your employer.  °  How to apply: °Eligibility screenings are held every Tuesday and Wednesday afternoon from 1:00 pm until 4:00 pm. You do not need an appointment for the interview!  °[facility] [address], [place], [place] 336-631-2330   °[facility]  336-342-8273   °[facility]  336-703-3100   °[facility]  336-570-6415   ° °Behavioral Health Resources in the Community: °Intensive Outpatient Programs °Organization         Address  Phone  Notes  °[facility] [address], [place], [place] 336-878-6098   °[facility] [address], [place], [place] 336-832-9800   °[facility] [address], [place], [place] ° 336-882-2125   °[facility] [address],  °[place], [place] 1-800-853-5163 or 336-641-4981   °Substance Abuse Resources °Organization         Address  Phone  Notes  °[facility]  336-882-2125   °[facility]  336-784-9470   °[facility]  336-285-9073   °[facility]  336-845-3988   °Residential & Outpatient Substance Abuse Program  1-800-659-3381   °Psychological Services °Organization         Address  Phone  Notes  °[facility]  336- 832-9600   °[facility]  336- 378-7881   °[facility] [address], [place] 1-800-853-5163 or 336-641-4981   ° °Mobile Crisis Teams °Organization         Address  Phone  Notes  °[facility], Mobile Crisis Care Unit  1-877-626-1772   °Assertive °[facility] ° [address]  [place], [place] 336-834-9664   °Sharon DeEsch [address] °[place] 336-554-5454   ° °Self-Help/Support Groups °Organization         Address  Phone             Notes  °[facility]  - variety of support groups  336- 373-1402 Call for more information  °Narcotics Anonymous (NA), [facility] [address], °[place]   2 meetings at this location  ° °  Residential Treatment Programs °Organization         Address  Phone  Notes  °[facility] [address],    °[place]  1-866-801-8205   °[facility] ° [address], [place], [place] 704-293-8524   °[facility] [address], [place] 336-845-3988 Admissions: 8am-3pm M-F  °[facility] [address]    °[place], [place] 336-841-1104   °[facility] [address], [place], [place] 336-379-7146   °[facility] [address]  °[place], [place] 336-285-9073   °[facility] Intensive Outpatient [address], [place], [place] 336-852-3033   °[facility] [address]  °[place], [place] 1-877-615-2722 or 336-784-9470   °[facility] [address] [place], [place] 336-227-7417 Accepts Medicaid  °[facility] [address]  °[place] 1-800-659-3381 Substance Abuse/Addiction Treatment  ° °[place] Behavioral Health Resources °Organization         Address  Phone  Notes  °[facility]  (888) 581-9988   °Julie Brannon, PhD [address] [place], [place]   (336) 349-5553 or (336) 951-0000   °[facility]   [address] °[place], [place] (336) 349-4454   °[facility] [address], [place], [place] (336) 342-8316 Insurance/Medicaid/sponsorship through [facility]  °[facility] [address]                                    [place], [place] (336) 342-8316 Therapy/tele-psych/case    °[facility] [address]  ° [place], [place] (336) 349-2233    °Dr. Arfeen  (336) 349-4544   °[facility] [place]  [employer] [place] [facility] 1) [address], [place] °2) [address], [place] °3)  [address], [place] (336) 349-3220 °(336) 342-7768 ° °(336) 342-8140   °[place] [facility] (336) 342-1394 or (336) 342-3537 (After Hours)    ° ° °

## 2013-12-28 NOTE — ED Notes (Signed)
Pt noticed a bump on the upper inside of her gums, states she has also been vomiting and having diarrhea.  Pain in abd

## 2013-12-28 NOTE — ED Provider Notes (Signed)
CSN: [id number]     Arrival date & time 12/28/13  0236 History   First MD Initiated Contact with Patient 12/28/13 0240     Chief Complaint  Patient presents with  . Dental Pain     (Consider location/radiation/quality/duration/timing/severity/associated sxs/prior Treatment) HPI Comments: 43 year old female, history of poor dental disease, states that she has had a fractured right upper central incisor for some time, it has started to hurt more over the last couple of days, worse with chewing, associated with mild swelling and lymphadenopathy of the neck but no fevers chills nausea or vomiting.  Pain is 8/10, described as dull throbbing and aching  Patient is a 43 y.o. female presenting with tooth pain. The history is provided by the patient.  Dental Pain Associated symptoms: no facial swelling and no fever     Past Medical History  Diagnosis Date  . Asthma   . Hypertension   . Bronchitis   . Headache    Past Surgical History  Procedure Laterality Date  . Leg surgery    . Cesarean section    . Breast surgery     No family history on file. History  Substance Use Topics  . Smoking status: Current Every Day Smoker    Types: Cigarettes  . Smokeless tobacco: Not on file  . Alcohol Use: Yes     Comment: 2 40 oz daily   OB History   Grav Para Term Preterm Abortions TAB SAB Ect Mult Living                 Review of Systems  Constitutional: Negative for fever and chills.  HENT: Positive for dental problem. Negative for facial swelling, sore throat, trouble swallowing and voice change.        Toothache  Gastrointestinal: Negative for nausea and vomiting.      Allergies  Erythromycin  Home Medications   Prior to Admission medications   Medication Sig Start Date End Date Taking? Authorizing Provider  guaifenesin (ROBITUSSIN) 100 MG/5ML syrup Take 5-10 mLs (100-200 mg total) by mouth every 4 (four) hours as needed for cough. 10/23/13   Hope Bunnie Pion, NP   HYDROcodone-acetaminophen (NORCO/VICODIN) 5-325 MG per tablet Take 2 tablets by mouth every 4 (four) hours as needed. 12/28/13   Johnna Acosta, MD  naproxen (NAPROSYN) 500 MG tablet Take 1 tablet (500 mg total) by mouth 2 (two) times daily with a meal. 12/28/13   Johnna Acosta, MD  penicillin v potassium (VEETID) 500 MG tablet Take 1 tablet (500 mg total) by mouth 4 (four) times daily. 12/28/13   Johnna Acosta, MD  predniSONE (DELTASONE) 10 MG tablet Take 2 tablets (20 mg total) by mouth 2 (two) times daily with a meal. 10/23/13   Hope Bunnie Pion, NP   BP 135/85  Pulse 93  Temp(Src) 98.5 F (36.9 C) (Oral)  Resp 16  Ht 5\' 6"  (1.676 m)  Wt 180 lb (81.647 kg)  BMI 29.07 kg/m2  SpO2 99%  LMP 12/18/2013 Physical Exam  Nursing note and vitals reviewed. Constitutional: She appears well-developed and well-nourished. No distress.  HENT:  Head: Normocephalic and atraumatic.  Mouth/Throat: Oropharynx is clear and moist. No oropharyngeal exudate.  Diffuse dental disease, mild swelling around the base of the right upper central incisor with a fracture line through the tooth, the tooth is not well set in the socket, patient states this is chronic, no tenderness under the tongue, normal tongue protrusion, no trismus or torticollis, no gingival  induration or fluctuance  Eyes: Conjunctivae are normal. No scleral icterus.  Neck: Normal range of motion. Neck supple. No thyromegaly present.  Shoddy lymphadenopathy of the right neck, no meningismus or tenderness of the neck or the submandibular area  Cardiovascular: Normal rate and intact distal pulses.   Pulmonary/Chest: Effort normal. No respiratory distress.  Lymphadenopathy:    She has no cervical adenopathy.  Neurological: She is alert.  Skin: Skin is warm and dry. No rash noted. She is not diaphoretic.    ED Course  Procedures (including critical care time) Labs Review Labs Reviewed - No data to display  Imaging Review No results  found.    MDM   Final diagnoses:  Dental abscess    Diffuse dental disease, fracture of the tooth with an associated periapical abscess, penicillin, Naprosyn, hydrocodone, discharge home.  Dental followup information given    Johnna Acosta, MD 12/28/13 0330

## 2014-01-02 MED FILL — Hydrocodone-Acetaminophen Tab 5-325 MG: ORAL | Qty: 6 | Status: AC

## 2014-01-23 ENCOUNTER — Emergency Department (HOSPITAL_COMMUNITY)
Admission: EM | Admit: 2014-01-23 | Discharge: 2014-01-23 | Disposition: A | Discharge status: Home / Self Care | Payer: Self-pay | Attending: Emergency Medicine | Admitting: Emergency Medicine

## 2014-01-23 ENCOUNTER — Encounter (HOSPITAL_COMMUNITY): Payer: Self-pay | Admitting: Emergency Medicine

## 2014-01-23 DIAGNOSIS — I10 Essential (primary) hypertension: Secondary | ICD-10-CM | POA: Insufficient documentation

## 2014-01-23 DIAGNOSIS — K089 Disorder of teeth and supporting structures, unspecified: Secondary | ICD-10-CM | POA: Insufficient documentation

## 2014-01-23 DIAGNOSIS — IMO0002 Reserved for concepts with insufficient information to code with codable children: Secondary | ICD-10-CM | POA: Insufficient documentation

## 2014-01-23 DIAGNOSIS — F17200 Nicotine dependence, unspecified, uncomplicated: Secondary | ICD-10-CM | POA: Insufficient documentation

## 2014-01-23 DIAGNOSIS — Z79899 Other long term (current) drug therapy: Secondary | ICD-10-CM | POA: Insufficient documentation

## 2014-01-23 DIAGNOSIS — K0889 Other specified disorders of teeth and supporting structures: Secondary | ICD-10-CM

## 2014-01-23 DIAGNOSIS — J45909 Unspecified asthma, uncomplicated: Secondary | ICD-10-CM | POA: Insufficient documentation

## 2014-01-23 MED ORDER — ONDANSETRON HCL 4 MG PO TABS
4.0000 mg | ORAL_TABLET | Freq: Once | ORAL | Status: AC
  Administered 2014-01-23: 4 mg via ORAL
  Filled 2014-01-23: qty 1

## 2014-01-23 MED ORDER — AMOXICILLIN 500 MG PO CAPS: 500.0000 mg | ORAL_CAPSULE | Freq: Three times a day (TID) | ORAL | Status: DC

## 2014-01-23 MED ORDER — HYDROCODONE-ACETAMINOPHEN 5-325 MG PO TABS
1.0000 | ORAL_TABLET | ORAL | Status: DC | PRN
Start: 2014-01-23 — End: 2014-04-13

## 2014-01-23 MED ORDER — HYDROCODONE-ACETAMINOPHEN 5-325 MG PO TABS
2.0000 | ORAL_TABLET | Freq: Once | ORAL | Status: AC
  Administered 2014-01-23: 2 via ORAL
  Filled 2014-01-23: qty 2

## 2014-01-23 MED ORDER — PENICILLIN V POTASSIUM 250 MG PO TABS
500.0000 mg | ORAL_TABLET | Freq: Once | ORAL | Status: AC
  Administered 2014-01-23: 500 mg via ORAL
  Filled 2014-01-23: qty 2

## 2014-01-23 MED ORDER — IBUPROFEN 800 MG PO TABS
800.0000 mg | ORAL_TABLET | Freq: Once | ORAL | Status: AC
  Administered 2014-01-23: 800 mg via ORAL
  Filled 2014-01-23: qty 1

## 2014-01-23 NOTE — ED Provider Notes (Signed)
Medical screening examination/treatment/procedure(s) were performed by non-physician practitioner and as supervising physician I was immediately available for consultation/collaboration.   EKG Interpretation None       Anneliese Leblond, MD 01/23/14 1816 

## 2014-01-23 NOTE — ED Notes (Signed)
Dental pain , rt upper molar since yesterday

## 2014-01-23 NOTE — Discharge Instructions (Signed)
You have a developing abscess on the right side of your mouth. It is important that you see a dentist as sone as possible. Please use Amoxil and 600 mg of ibuprofen 3 times daily. Please take these with food. May use Norco for more severe pain. This medication may cause drowsiness, please use with caution. Dental Pain A tooth ache may be caused by cavities (tooth decay). Cavities expose the nerve of the tooth to air and hot or cold temperatures. It may come from an infection or abscess (also called a boil or furuncle) around your tooth. It is also often caused by dental caries (tooth decay). This causes the pain you are having. DIAGNOSIS  Your caregiver can diagnose this problem by exam. TREATMENT   If caused by an infection, it may be treated with medications which kill germs (antibiotics) and pain medications as prescribed by your caregiver. Take medications as directed.  Only take over-the-counter or prescription medicines for pain, discomfort, or fever as directed by your caregiver.  Whether the tooth ache today is caused by infection or dental disease, you should see your dentist as soon as possible for further care. SEEK MEDICAL CARE IF: The exam and treatment you received today has been provided on an emergency basis only. This is not a substitute for complete medical or dental care. If your problem worsens or new problems (symptoms) appear, and you are unable to meet with your dentist, call or return to this location. SEEK IMMEDIATE MEDICAL CARE IF:   You have a fever.  You develop redness and swelling of your face, jaw, or neck.  You are unable to open your mouth.  You have severe pain uncontrolled by pain medicine. MAKE SURE YOU:   Understand these instructions.  Will watch your condition.  Will get help right away if you are not doing well or get worse. Document Released: 08/18/2005 Document Revised: 11/10/2011 Document Reviewed: 04/05/2008 [facility] Patient Information  2014 [place].

## 2014-01-23 NOTE — ED Notes (Signed)
Pt with abscess to upper rt gum area near molars. States it started yesterday. Pt does not have a dentist.

## 2014-01-23 NOTE — ED Provider Notes (Signed)
CSN: [id number]     Arrival date & time 01/23/14  1147 History  This chart was scribed for non-physician practitioner Evorn Gong. Mitzi Davenport, working with , by Neta Ehlers, ED Scribe. This patient was seen in room APFT20/APFT20 and the patient's care was started at 2:33 PM.  None    Chief Complaint  Patient presents with  . Dental Pain    The history is provided by the patient. No language interpreter was used.   HPI Comments: Kristin Carson is a 43 y.o. female who presents to the Emergency Department complaining of right, upper dental pain which began yesterday. She denies any injuries or trauma to her teeth. She also denies a fever. She denies known pregnancy. Ms. Amara is a current smoker.   Past Medical History  Diagnosis Date  . Asthma   . Hypertension   . Bronchitis   . Headache    Past Surgical History  Procedure Laterality Date  . Leg surgery    . Cesarean section    . Breast surgery     History reviewed. No pertinent family history. History  Substance Use Topics  . Smoking status: Current Every Day Smoker    Types: Cigarettes  . Smokeless tobacco: Not on file  . Alcohol Use: Yes     Comment: 2 40 oz daily   No OB history provided.  Review of Systems  Constitutional: Negative for fever.  HENT: Positive for dental problem.   All other systems reviewed and are negative.   Allergies  Erythromycin  Home Medications   Prior to Admission medications   Medication Sig Start Date End Date Taking? Authorizing Provider  guaifenesin (ROBITUSSIN) 100 MG/5ML syrup Take 5-10 mLs (100-200 mg total) by mouth every 4 (four) hours as needed for cough. 10/23/13   Hope Bunnie Pion, NP  HYDROcodone-acetaminophen (NORCO/VICODIN) 5-325 MG per tablet Take 2 tablets by mouth every 4 (four) hours as needed. 12/28/13   Johnna Acosta, MD  naproxen (NAPROSYN) 500 MG tablet Take 1 tablet (500 mg total) by mouth 2 (two) times daily with a meal. 12/28/13   Johnna Acosta, MD  penicillin v  potassium (VEETID) 500 MG tablet Take 1 tablet (500 mg total) by mouth 4 (four) times daily. 12/28/13   Johnna Acosta, MD  predniSONE (DELTASONE) 10 MG tablet Take 2 tablets (20 mg total) by mouth 2 (two) times daily with a meal. 10/23/13   Hope Bunnie Pion, NP   Triage Vitals: BP 158/87  Pulse 83  Temp(Src) 98.9 F (37.2 C) (Oral)  Resp 20  Ht 5\' 4"  (1.626 m)  Wt 180 lb (81.647 kg)  BMI 30.88 kg/m2  SpO2 100%  LMP 01/23/2014  Physical Exam  Nursing note and vitals reviewed. Constitutional: She is oriented to person, place, and time. She appears well-developed and well-nourished. No distress.  HENT:  Head: Normocephalic and atraumatic.  There is evidence of gum disease. There is swelling of the second upper right molar. There is a developing abscess of the upper gum. Airway is patent. No swelling under the tongue.   Eyes: EOM are normal.  Neck: Neck supple. No tracheal deviation present.  Cardiovascular: Normal rate, regular rhythm and normal heart sounds.  Exam reveals no friction rub.   No murmur heard. Pulmonary/Chest: Effort normal and breath sounds normal. No respiratory distress. She has no wheezes. She has no rales.  Musculoskeletal: Normal range of motion.  Lymphadenopathy:    She has no cervical adenopathy.  Neurological: She  is alert and oriented to person, place, and time.  Skin: Skin is warm and dry.  Psychiatric: She has a normal mood and affect. Her behavior is normal.    ED Course  Procedures (including critical care time)  DIAGNOSTIC STUDIES: Oxygen Saturation is 100% on room air, normal by my interpretation.    COORDINATION OF CARE:  2:37 PM- Discussed treatment plan with patient, and the patient agreed to the plan. The plan includes a prescription for antibiotics and pain medication. Cautioned the pt strongly that she needed to follow-up with a dentist.   Labs Review Labs Reviewed - No data to display  Imaging Review No results found.   EKG  Interpretation None      MDM Examination is consistent with dental caries. Vital signs are within normal limits with exception of the blood pressure being elevated at 158/87. Pulse oximetry 100% on room air. Within normal limits by my examination.  I have advised the patient on the importance of seeing a dentist systems possible. Prescription for Amoxil and Norco given to the patient.    Final diagnoses:  None    **I have reviewed nursing notes, vital signs, and all appropriate lab and imaging results for this patient.*  **I personally performed the services described in this documentation, which was scribed in my presence. The recorded information has been reviewed and is accurate.* All all all  Lenox Ahr, PA-C 01/23/14 2841

## 2014-04-13 ENCOUNTER — Emergency Department (HOSPITAL_COMMUNITY)
Admission: EM | Admit: 2014-04-13 | Discharge: 2014-04-13 | Disposition: A | Discharge status: Home / Self Care | Payer: Self-pay | Attending: Emergency Medicine | Admitting: Emergency Medicine

## 2014-04-13 ENCOUNTER — Encounter (HOSPITAL_COMMUNITY): Payer: Self-pay | Admitting: Emergency Medicine

## 2014-04-13 DIAGNOSIS — K089 Disorder of teeth and supporting structures, unspecified: Secondary | ICD-10-CM | POA: Insufficient documentation

## 2014-04-13 DIAGNOSIS — J45909 Unspecified asthma, uncomplicated: Secondary | ICD-10-CM | POA: Insufficient documentation

## 2014-04-13 DIAGNOSIS — K0889 Other specified disorders of teeth and supporting structures: Secondary | ICD-10-CM

## 2014-04-13 DIAGNOSIS — Z79899 Other long term (current) drug therapy: Secondary | ICD-10-CM | POA: Insufficient documentation

## 2014-04-13 DIAGNOSIS — F17200 Nicotine dependence, unspecified, uncomplicated: Secondary | ICD-10-CM | POA: Insufficient documentation

## 2014-04-13 DIAGNOSIS — K029 Dental caries, unspecified: Secondary | ICD-10-CM | POA: Insufficient documentation

## 2014-04-13 DIAGNOSIS — Z8709 Personal history of other diseases of the respiratory system: Secondary | ICD-10-CM | POA: Insufficient documentation

## 2014-04-13 DIAGNOSIS — I10 Essential (primary) hypertension: Secondary | ICD-10-CM | POA: Insufficient documentation

## 2014-04-13 MED ORDER — OXYCODONE-ACETAMINOPHEN 5-325 MG PO TABS
1.0000 | ORAL_TABLET | Freq: Once | ORAL | Status: AC
  Administered 2014-04-13: 1 via ORAL
  Filled 2014-04-13: qty 1

## 2014-04-13 MED ORDER — HYDROCODONE-ACETAMINOPHEN 5-325 MG PO TABS: ORAL_TABLET | ORAL | Status: DC

## 2014-04-13 MED ORDER — AMOXICILLIN 250 MG PO CAPS
500.0000 mg | ORAL_CAPSULE | Freq: Once | ORAL | Status: AC
  Administered 2014-04-13: 500 mg via ORAL
  Filled 2014-04-13: qty 2

## 2014-04-13 MED ORDER — AMOXICILLIN 500 MG PO CAPS: 500.0000 mg | ORAL_CAPSULE | Freq: Three times a day (TID) | ORAL | Status: DC

## 2014-04-13 NOTE — ED Provider Notes (Signed)
Medical screening examination/treatment/procedure(s) were performed by non-physician practitioner and as supervising physician I was immediately available for consultation/collaboration.   EKG Interpretation None        Maudry Diego, MD 04/13/14 2218

## 2014-04-13 NOTE — Discharge Instructions (Signed)

## 2014-04-13 NOTE — ED Notes (Addendum)
Pt reports right sided dental pain x2 days. Pt denies any known injury. nad noted. Airway patent. Mild swelling noted to right jaw.

## 2014-04-13 NOTE — ED Provider Notes (Signed)
CSN: [id number]     Arrival date & time 04/13/14  1829 History   First MD Initiated Contact with Patient 04/13/14 [place]     Chief Complaint  Patient presents with  . Dental Pain     (Consider location/radiation/quality/duration/timing/severity/associated sxs/prior Treatment) Patient is a 43 y.o. female presenting with tooth pain. The history is provided by the patient.  Dental Pain Location:  Lower Lower teeth location:  31/RL 2nd molar, 30/RL 1st molar and 29/RL 2nd bicuspid Quality:  Throbbing, shooting and sharp Severity:  Moderate Onset quality:  Gradual Duration:  2 days Timing:  Constant Progression:  Unchanged Chronicity:  New Context: dental caries and poor dentition   Context: filling still in place, not malocclusion, not recent dental surgery and not trauma   Relieved by:  Nothing Worsened by:  Cold food/drink Ineffective treatments:  Acetaminophen Associated symptoms: facial pain and gum swelling   Associated symptoms: no congestion, no difficulty swallowing, no facial swelling, no fever, no headaches, no neck pain, no neck swelling, no oral lesions and no trismus   Risk factors: lack of dental care, periodontal disease and smoking   Risk factors: no diabetes     Past Medical History  Diagnosis Date  . Asthma   . Hypertension   . Bronchitis   . Headache    Past Surgical History  Procedure Laterality Date  . Leg surgery    . Cesarean section    . Breast surgery     History reviewed. No pertinent family history. History  Substance Use Topics  . Smoking status: Current Every Day Smoker -- 0.50 packs/day    Types: Cigarettes  . Smokeless tobacco: Not on file  . Alcohol Use: Yes     Comment: intermittent   OB History   Grav Para Term Preterm Abortions TAB SAB Ect Mult Living                 Review of Systems  Constitutional: Negative for fever and appetite change.  HENT: Positive for dental problem. Negative for congestion, facial swelling, mouth  sores, sore throat and trouble swallowing.   Eyes: Negative for pain and visual disturbance.  Musculoskeletal: Negative for neck pain and neck stiffness.  Neurological: Negative for dizziness, facial asymmetry and headaches.  Hematological: Negative for adenopathy.  All other systems reviewed and are negative.     Allergies  Erythromycin  Home Medications   Prior to Admission medications   Medication Sig Start Date End Date Taking? Authorizing Provider  acetaminophen (TYLENOL) 500 MG tablet Take 1,000 mg by mouth every 6 (six) hours as needed for mild pain or moderate pain.   Yes Historical Provider, MD   BP 185/94  Pulse 86  Temp(Src) 98.4 F (36.9 C) (Oral)  Resp 18  Ht 5\' 6"  (1.676 m)  Wt 168 lb (76.204 kg)  BMI 27.13 kg/m2  SpO2 100%  LMP 04/13/2014 Physical Exam  Nursing note and vitals reviewed. Constitutional: She is oriented to person, place, and time. She appears well-developed and well-nourished. No distress.  HENT:  Head: Normocephalic and atraumatic.  Right Ear: Tympanic membrane and ear canal normal.  Left Ear: Tympanic membrane and ear canal normal.  Mouth/Throat: Uvula is midline, oropharynx is clear and moist and mucous membranes are normal. No trismus in the jaw. Dental caries present. No dental abscesses or uvula swelling.  Multiple dental caries and ttp of #29,30 and 31.  No facial swelling, obvious dental abscess, trismus, or sublingual abnml.    Neck: Normal  range of motion. Neck supple.  Cardiovascular: Normal rate, regular rhythm and normal heart sounds.   No murmur heard. Pulmonary/Chest: Effort normal and breath sounds normal.  Musculoskeletal: Normal range of motion.  Lymphadenopathy:    She has no cervical adenopathy.  Neurological: She is alert and oriented to person, place, and time. She exhibits normal muscle tone. Coordination normal.  Skin: Skin is warm and dry.    ED Course  Procedures (including critical care time) Labs Review Labs  Reviewed - No data to display  Imaging Review No results found.   EKG Interpretation None      MDM   Final diagnoses:  Pain, dental    Pt is well appearing.  Pt is hypertensive, but has hx of same. BP improved after tooth pain improved.  Patient only c/o tonight is dental pain.  Advised her to arrange f/u with the health dept regarding her BP.  No concerning sx's for infection to floor of the mouth or deep structures of the neck.  rx for amoxil and vicodin.      Georgianna Band L. Vanessa [place], PA-C 04/13/14 2140

## 2014-04-13 NOTE — ED Notes (Signed)
Pt alert & oriented x4, stable gait. Patient given discharge instructions, paperwork & prescription(s). Patient  instructed to stop at the registration desk to finish any additional paperwork. Patient verbalized understanding. Pt left department w/ no further questions. 

## 2014-04-21 ENCOUNTER — Emergency Department (HOSPITAL_COMMUNITY)
Admission: EM | Admit: 2014-04-21 | Discharge: 2014-04-21 | Disposition: A | Discharge status: Home / Self Care | Payer: Self-pay | Attending: Emergency Medicine | Admitting: Emergency Medicine

## 2014-04-21 ENCOUNTER — Encounter (HOSPITAL_COMMUNITY): Payer: Self-pay | Admitting: Emergency Medicine

## 2014-04-21 DIAGNOSIS — F17200 Nicotine dependence, unspecified, uncomplicated: Secondary | ICD-10-CM | POA: Insufficient documentation

## 2014-04-21 DIAGNOSIS — I10 Essential (primary) hypertension: Secondary | ICD-10-CM | POA: Insufficient documentation

## 2014-04-21 DIAGNOSIS — J45909 Unspecified asthma, uncomplicated: Secondary | ICD-10-CM | POA: Insufficient documentation

## 2014-04-21 DIAGNOSIS — R221 Localized swelling, mass and lump, neck: Secondary | ICD-10-CM

## 2014-04-21 DIAGNOSIS — R220 Localized swelling, mass and lump, head: Secondary | ICD-10-CM | POA: Insufficient documentation

## 2014-04-21 DIAGNOSIS — K0889 Other specified disorders of teeth and supporting structures: Secondary | ICD-10-CM

## 2014-04-21 DIAGNOSIS — K089 Disorder of teeth and supporting structures, unspecified: Secondary | ICD-10-CM | POA: Insufficient documentation

## 2014-04-21 MED ORDER — IBUPROFEN 800 MG PO TABS
800.0000 mg | ORAL_TABLET | Freq: Once | ORAL | Status: AC
  Administered 2014-04-21: 800 mg via ORAL
  Filled 2014-04-21: qty 1

## 2014-04-21 MED ORDER — PENICILLIN V POTASSIUM 500 MG PO TABS: 500.0000 mg | ORAL_TABLET | Freq: Four times a day (QID) | ORAL | Status: AC

## 2014-04-21 NOTE — Discharge Instructions (Signed)

## 2014-04-21 NOTE — ED Notes (Signed)
Pt c/o dental pain x 2 days.  

## 2014-04-21 NOTE — ED Notes (Signed)
Dr. Wickline at bedside.  

## 2014-04-21 NOTE — ED Provider Notes (Signed)
CSN: [id number]     Arrival date & time 04/21/14  0154 History   First MD Initiated Contact with Patient 04/21/14 0206     Chief Complaint  Patient presents with  . Dental Pain     Patient is a 43 y.o. female presenting with tooth pain. The history is provided by the patient.  Dental Pain Location:  Upper and lower Severity:  Moderate Onset quality:  Gradual Duration:  2 days Timing:  Constant Progression:  Worsening Chronicity:  Recurrent Relieved by:  Nothing Associated symptoms: facial swelling   Associated symptoms: no difficulty swallowing and no fever     Past Medical History  Diagnosis Date  . Asthma   . Hypertension   . Bronchitis   . Headache    Past Surgical History  Procedure Laterality Date  . Leg surgery    . Cesarean section    . Breast surgery     History reviewed. No pertinent family history. History  Substance Use Topics  . Smoking status: Current Every Day Smoker -- 0.50 packs/day    Types: Cigarettes  . Smokeless tobacco: Not on file  . Alcohol Use: Yes     Comment: intermittent   OB History   Grav Para Term Preterm Abortions TAB SAB Ect Mult Living                 Review of Systems  Constitutional: Negative for fever.  HENT: Positive for facial swelling.       Allergies  Erythromycin  Home Medications   Prior to Admission medications   Medication Sig Start Date End Date Taking? Authorizing Provider  acetaminophen (TYLENOL) 500 MG tablet Take 1,000 mg by mouth every 6 (six) hours as needed for mild pain or moderate pain.    Historical Provider, MD  penicillin v potassium (VEETID) 500 MG tablet Take 1 tablet (500 mg total) by mouth 4 (four) times daily. 04/21/14 04/28/14  Sharyon Cable, MD   BP 198/99  Pulse 88  Temp(Src) 98.5 F (36.9 C)  Resp 18  Ht 5\' 8"  (1.727 m)  Wt 183 lb (83.008 kg)  BMI 27.83 kg/m2  SpO2 100%  LMP 04/13/2014 Physical Exam CONSTITUTIONAL: Well developed/well nourished HEAD AND FACE:  Normocephalic/atraumatic EYES: EOMI, no proptosis ENMT: Mucous membranes moist.  Poor dentition.  No trismus.  No focal abscess noted identified to drain.  She has diffuse tenderness to left upper gingiva and left lower gingiva.  Mild external facial swelling on left side but no erythema or crepitus noted.   NECK: supple no meningeal signs CV: S1/S2 noted, no murmurs/rubs/gallops noted LUNGS: Lungs are clear to auscultation bilaterally, no apparent distress ABDOMEN: soft, nontender, no rebound or guarding NEURO: Pt is awake/alert, moves all extremitiesx4 EXTREMITIES:full ROM SKIN: warm, color normal  ED Course  Procedures  Pt already has been taking amoxicillin now with worsening pain and mild facial swelling There is no gingival abscess to drain Will start on PCN and advised need to see dentist as outpatient  MDM   Final diagnoses:  Pain, dental    Nursing notes including past medical history and social history reviewed and considered in documentation Previous records reviewed and considered     Sharyon Cable, MD 04/21/14 [id number]

## 2015-01-24 ENCOUNTER — Emergency Department (HOSPITAL_COMMUNITY): Payer: Self-pay

## 2015-01-24 ENCOUNTER — Emergency Department (HOSPITAL_COMMUNITY)
Admission: EM | Admit: 2015-01-24 | Discharge: 2015-01-24 | Disposition: A | Discharge status: Home / Self Care | Payer: Self-pay | Attending: Emergency Medicine | Admitting: Emergency Medicine

## 2015-01-24 ENCOUNTER — Encounter (HOSPITAL_COMMUNITY): Payer: Self-pay | Admitting: Emergency Medicine

## 2015-01-24 DIAGNOSIS — J45909 Unspecified asthma, uncomplicated: Secondary | ICD-10-CM | POA: Insufficient documentation

## 2015-01-24 DIAGNOSIS — S93401A Sprain of unspecified ligament of right ankle, initial encounter: Secondary | ICD-10-CM | POA: Insufficient documentation

## 2015-01-24 DIAGNOSIS — I10 Essential (primary) hypertension: Secondary | ICD-10-CM | POA: Insufficient documentation

## 2015-01-24 DIAGNOSIS — Z720 Tobacco use: Secondary | ICD-10-CM | POA: Insufficient documentation

## 2015-01-24 DIAGNOSIS — Y9289 Other specified places as the place of occurrence of the external cause: Secondary | ICD-10-CM | POA: Insufficient documentation

## 2015-01-24 DIAGNOSIS — Y9389 Activity, other specified: Secondary | ICD-10-CM | POA: Insufficient documentation

## 2015-01-24 DIAGNOSIS — X58XXXA Exposure to other specified factors, initial encounter: Secondary | ICD-10-CM | POA: Insufficient documentation

## 2015-01-24 DIAGNOSIS — Y990 Civilian activity done for income or pay: Secondary | ICD-10-CM | POA: Insufficient documentation

## 2015-01-24 MED ORDER — HYDROCODONE-ACETAMINOPHEN 5-325 MG PO TABS: ORAL_TABLET | ORAL | Status: DC

## 2015-01-24 NOTE — ED Notes (Signed)
Pt reports right ankle pain ever since "twisting" ankle at work. nad noted. Mild swelling noted to right ankle.

## 2015-01-24 NOTE — Discharge Instructions (Signed)
Ankle Sprain  An ankle sprain is an injury to the strong, fibrous tissues (ligaments) that hold your ankle bones together.   HOME CARE   · Put ice on your ankle for 1-2 days or as told by your doctor.  ¨ Put ice in a plastic bag.  ¨ Place a towel between your skin and the bag.  ¨ Leave the ice on for 15-20 minutes at a time, every 2 hours while you are awake.  · Only take medicine as told by your doctor.  · Raise (elevate) your injured ankle above the level of your heart as much as possible for 2-3 days.  · Use crutches if your doctor tells you to. Slowly put your own weight on the affected ankle. Use the crutches until you can walk without pain.  · If you have a plaster splint:  ¨ Do not rest it on anything harder than a pillow for 24 hours.  ¨ Do not put weight on it.  ¨ Do not get it wet.  ¨ Take it off to shower or bathe.  · If given, use an elastic wrap or support stocking for support. Take the wrap off if your toes lose feeling (numb), tingle, or turn cold or blue.  · If you have an air splint:  ¨ Add or let out air to make it comfortable.  ¨ Take it off at night and to shower and bathe.  ¨ Wiggle your toes and move your ankle up and down often while you are wearing it.  GET HELP IF:  · You have rapidly increasing bruising or puffiness (swelling).  · Your toes feel very cold.  · You lose feeling in your foot.  · Your medicine does not help your pain.  GET HELP RIGHT AWAY IF:   · Your toes lose feeling (numb) or turn blue.  · You have severe pain that is increasing.  MAKE SURE YOU:   · Understand these instructions.  · Will watch your condition.  · Will get help right away if you are not doing well or get worse.  Document Released: 02/04/2008 Document Revised: 01/02/2014 Document Reviewed: 03/01/2012  [employer]® Patient Information ©2015 [employer]. This information is not intended to replace advice given to you by your health care provider. Make sure you discuss any questions you have with your health care  provider.

## 2015-01-27 NOTE — ED Provider Notes (Signed)
CSN: [id number]     Arrival date & time 01/24/15  1743 History   First MD Initiated Contact with Patient 01/24/15 1813     Chief Complaint  Patient presents with  . Ankle Pain     (Consider location/radiation/quality/duration/timing/severity/associated sxs/prior Treatment) HPI   Kristin Carson is a 44 y.o. female who presents to the Emergency Department complaining of right ankle and swelling after a twisting injury that occurred several hours prior to arrival.  Pain is worse with weight bearing.  Pt has hx of previous ankle injury with surgical repair several years ago.  She has taken tylenol without relief.  She denies numbness, swelling or pain above the ankle.     Past Medical History  Diagnosis Date  . Asthma   . Hypertension   . Bronchitis   . Headache    Past Surgical History  Procedure Laterality Date  . Leg surgery    . Cesarean section    . Breast surgery     History reviewed. No pertinent family history. History  Substance Use Topics  . Smoking status: Current Every Day Smoker -- 0.50 packs/day    Types: Cigarettes  . Smokeless tobacco: Not on file  . Alcohol Use: Yes     Comment: intermittent   OB History    No data available     Review of Systems  Constitutional: Negative for fever and chills.  Genitourinary: Negative for dysuria and difficulty urinating.  Musculoskeletal: Positive for joint swelling and arthralgias (right ankle pain).  Skin: Negative for color change and wound.  All other systems reviewed and are negative.     Allergies  Ibuprofen and Erythromycin  Home Medications   Prior to Admission medications   Medication Sig Start Date End Date Taking? Authorizing Provider  acetaminophen (TYLENOL) 500 MG tablet Take 1,000 mg by mouth every 6 (six) hours as needed for mild pain or moderate pain.   Yes Historical Provider, MD  HYDROcodone-acetaminophen (NORCO/VICODIN) 5-325 MG per tablet Take one tab po q 4-6 hrs prn pain 01/24/15   Keiona Jenison, PA-C   BP 176/86 mmHg  Pulse 93  Temp(Src) 98 F (36.7 C) (Oral)  Resp 16  Ht 5\' 6"  (1.676 m)  Wt 178 lb (80.74 kg)  BMI 28.74 kg/m2  SpO2 100%  LMP 01/20/2015 Physical Exam  Constitutional: She is oriented to person, place, and time. She appears well-developed and well-nourished. No distress.  HENT:  Head: Normocephalic and atraumatic.  Cardiovascular: Normal rate, regular rhythm, normal heart sounds and intact distal pulses.   No murmur heard. Pulmonary/Chest: Effort normal and breath sounds normal. No respiratory distress.  Musculoskeletal: She exhibits tenderness.  ttp of lateral right ankle.  Mild edema.  DP pulse is brisk,distal sensation intact.  No erythema, abrasion, bruising or bony deformity.  No proximal tenderness.  Neurological: She is alert and oriented to person, place, and time. She exhibits normal muscle tone. Coordination normal.  Skin: Skin is warm and dry.  Nursing note and vitals reviewed.   ED Course  Procedures (including critical care time) Labs Review Labs Reviewed - No data to display  Imaging Review Dg Ankle Complete Right  01/24/2015   CLINICAL DATA:  Generalized pain and swelling for 1 day. No history of recent trauma.  EXAM: RIGHT ANKLE - COMPLETE 3+ VIEW  COMPARISON:  May 17, 2011.  FINDINGS: Frontal, oblique, and lateral views were obtained. There is evidence of prior screw and nail fixation through the medial malleolar region. There  is a screw placed in the distal fibula. There is bony overgrowth along the distal medial tibial metaphysis. There is evidence of an old avulsion in the lateral malleolar region with remodeling.  There is no acute fracture or effusion. 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 The ankle mortise appears grossly intact.  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 Postoperative change with bony remodeling. No acute fracture. Ankle mortise  appears grossly intact.   Electronically Signed   By: Lowella Grip III M.D.   On: 01/24/2015 18:25     EKG Interpretation None      MDM   Final diagnoses:  Sprain of ankle, right, initial encounter    Sprain ankle, XR neg for fx.  Pt agrees to RICE therapy and close ortho f/u   ASO applied, pain improved, remains NV intact.      Kem Parkinson, PA-C 01/27/15 Kendall West, MD 01/27/15 2337

## 2015-03-19 ENCOUNTER — Encounter (HOSPITAL_COMMUNITY): Payer: Self-pay | Admitting: Emergency Medicine

## 2015-03-19 ENCOUNTER — Emergency Department (HOSPITAL_COMMUNITY): Payer: Self-pay

## 2015-03-19 ENCOUNTER — Emergency Department (HOSPITAL_COMMUNITY)
Admission: EM | Admit: 2015-03-19 | Discharge: 2015-03-19 | Disposition: A | Discharge status: Home / Self Care | Payer: Self-pay | Attending: Emergency Medicine | Admitting: Emergency Medicine

## 2015-03-19 DIAGNOSIS — Z720 Tobacco use: Secondary | ICD-10-CM | POA: Insufficient documentation

## 2015-03-19 DIAGNOSIS — I10 Essential (primary) hypertension: Secondary | ICD-10-CM | POA: Insufficient documentation

## 2015-03-19 DIAGNOSIS — F419 Anxiety disorder, unspecified: Secondary | ICD-10-CM | POA: Insufficient documentation

## 2015-03-19 DIAGNOSIS — J40 Bronchitis, not specified as acute or chronic: Secondary | ICD-10-CM

## 2015-03-19 DIAGNOSIS — J4521 Mild intermittent asthma with (acute) exacerbation: Secondary | ICD-10-CM | POA: Insufficient documentation

## 2015-03-19 LAB — TROPONIN I

## 2015-03-19 LAB — BASIC METABOLIC PANEL
Anion gap: 8 (ref 5–15)
BUN: 17 mg/dL (ref 6–20)
CHLORIDE: 103 mmol/L (ref 101–111)
CO2: 26 mmol/L (ref 22–32)
Calcium: 8.5 mg/dL — ABNORMAL LOW (ref 8.9–10.3)
Creatinine, Ser: 1.04 mg/dL — ABNORMAL HIGH (ref 0.44–1.00)
GFR calc non Af Amer: >60 mL/min (ref >60)
Glucose, Bld: 83 mg/dL (ref 65–99)
Potassium: 3.9 mmol/L (ref 3.5–5.1)
Sodium: 137 mmol/L (ref 135–145)

## 2015-03-19 LAB — CBC WITH DIFFERENTIAL/PLATELET
BASOS ABS: 0 10*3/uL (ref 0.0–0.1)
Basophils Relative: 0 % (ref 0–1)
EOS ABS: 0.3 10*3/uL (ref 0.0–0.7)
Eosinophils Relative: 4 % (ref 0–5)
HEMATOCRIT: 40 % (ref 36.0–46.0)
HEMOGLOBIN: 13.1 g/dL (ref 12.0–15.0)
Lymphocytes Relative: 29 % (ref 12–46)
Lymphs Abs: 2.4 10*3/uL (ref 0.7–4.0)
MCH: 27.9 pg (ref 26.0–34.0)
MCHC: 32.8 g/dL (ref 30.0–36.0)
MCV: 85.3 fL (ref 78.0–100.0)
MONOS PCT: 9 % (ref 3–12)
Monocytes Absolute: 0.7 10*3/uL (ref 0.1–1.0)
NEUTROS ABS: 4.7 10*3/uL (ref 1.7–7.7)
NEUTROS PCT: 58 % (ref 43–77)
PLATELETS: 266 10*3/uL (ref 150–400)
RBC: 4.69 MIL/uL (ref 3.87–5.11)
RDW: 15 % (ref 11.5–15.5)
WBC: 8.2 10*3/uL (ref 4.0–10.5)

## 2015-03-19 LAB — BRAIN NATRIURETIC PEPTIDE: B Natriuretic Peptide: 22 pg/mL (ref 0.0–100.0)

## 2015-03-19 MED ORDER — METHYLPREDNISOLONE SODIUM SUCC 125 MG IJ SOLR
125.0000 mg | Freq: Once | INTRAMUSCULAR | Status: AC
  Administered 2015-03-19: 125 mg via INTRAVENOUS
  Filled 2015-03-19: qty 2

## 2015-03-19 MED ORDER — ALBUTEROL SULFATE HFA 108 (90 BASE) MCG/ACT IN AERS
2.0000 | INHALATION_SPRAY | RESPIRATORY_TRACT | Status: DC | PRN
  Administered 2015-03-19: 2 via RESPIRATORY_TRACT
  Filled 2015-03-19: qty 6.7

## 2015-03-19 MED ORDER — LORAZEPAM 2 MG/ML IJ SOLN
1.0000 mg | Freq: Once | INTRAMUSCULAR | Status: AC
  Administered 2015-03-19: 1 mg via INTRAVENOUS
  Filled 2015-03-19: qty 1

## 2015-03-19 MED ORDER — PREDNISONE 20 MG PO TABS: 60.0000 mg | ORAL_TABLET | Freq: Every day | ORAL | Status: DC

## 2015-03-19 MED ORDER — HYDROXYZINE HCL 25 MG PO TABS: 25.0000 mg | ORAL_TABLET | Freq: Four times a day (QID) | ORAL | Status: DC | PRN

## 2015-03-19 MED ORDER — ALBUTEROL SULFATE (2.5 MG/3ML) 0.083% IN NEBU
5.0000 mg | INHALATION_SOLUTION | Freq: Once | RESPIRATORY_TRACT | Status: AC
  Administered 2015-03-19: 5 mg via RESPIRATORY_TRACT
  Filled 2015-03-19: qty 6

## 2015-03-19 NOTE — ED Notes (Signed)
Patient brought in by EMS with complaint of shortness of breath and headache starting today. Also complaining of "a little chest pain when I breathe."

## 2015-03-19 NOTE — ED Provider Notes (Signed)
CSN: [id number]     Arrival date & time 03/19/15  1608 History   First MD Initiated Contact with Patient 03/19/15 1612     Chief Complaint  Patient presents with  . Shortness of Breath     (Consider location/radiation/quality/duration/timing/severity/associated sxs/prior Treatment) HPI Comments: Patient brought to emergency for shortness of breath and chest pain. Patient reports his symptoms began earlier today. She reports that she has had a cough for several days. Pain is in the center of her chest, worsens when she coughs or takes a deep breath. Cough has been mostly nonproductive. She does report a previous history of asthma and bronchitis. She has not had a fever. There is no abdominal pain, nausea, vomiting, diarrhea. She reports that she is feeling extremely anxious.  Patient is a 44 y.o. female presenting with shortness of breath.  Shortness of Breath Associated symptoms: chest pain, cough and headaches     Past Medical History  Diagnosis Date  . Asthma   . Hypertension   . Bronchitis   . Headache    Past Surgical History  Procedure Laterality Date  . Leg surgery    . Cesarean section    . Breast surgery     History reviewed. No pertinent family history. History  Substance Use Topics  . Smoking status: Current Every Day Smoker -- 0.50 packs/day    Types: Cigarettes  . Smokeless tobacco: Not on file  . Alcohol Use: Yes     Comment: intermittent   OB History    No data available     Review of Systems  Respiratory: Positive for cough and shortness of breath.   Cardiovascular: Positive for chest pain.  Neurological: Positive for headaches.  Psychiatric/Behavioral: The patient is nervous/anxious.   All other systems reviewed and are negative.     Allergies  Ibuprofen and Erythromycin  Home Medications   Prior to Admission medications   Medication Sig Start Date End Date Taking? Authorizing Provider  acetaminophen (TYLENOL) 500 MG tablet Take 1,000 mg by  mouth every 6 (six) hours as needed for mild pain or moderate pain.    Historical Provider, MD  HYDROcodone-acetaminophen (NORCO/VICODIN) 5-325 MG per tablet Take one tab po q 4-6 hrs prn pain 01/24/15   Tammy Triplett, PA-C  hydrOXYzine (ATARAX/VISTARIL) 25 MG tablet Take 1 tablet (25 mg total) by mouth every 6 (six) hours as needed for anxiety. 03/19/15   Orpah Greek, MD  predniSONE (DELTASONE) 20 MG tablet Take 3 tablets (60 mg total) by mouth daily with breakfast. 03/19/15   Orpah Greek, MD   BP 120/82 mmHg  Pulse 79  Temp(Src) 98.2 F (36.8 C) (Oral)  Resp 14  Ht 5\' 6"  (1.676 m)  Wt 180 lb (81.647 kg)  BMI 29.07 kg/m2  SpO2 100%  LMP 02/17/2015 Physical Exam  Constitutional: She is oriented to person, place, and time. She appears well-developed and well-nourished. No distress.  HENT:  Head: Normocephalic and atraumatic.  Right Ear: Hearing normal.  Left Ear: Hearing normal.  Nose: Nose normal.  Mouth/Throat: Oropharynx is clear and moist and mucous membranes are normal.  Eyes: Conjunctivae and EOM are normal. Pupils are equal, round, and reactive to light.  Neck: Normal range of motion. Neck supple.  Cardiovascular: Regular rhythm, S1 normal and S2 normal.  Exam reveals no gallop and no friction rub.   No murmur heard. Pulmonary/Chest: Effort normal. No respiratory distress. She has decreased breath sounds. She has wheezes. She exhibits no tenderness.  Abdominal:  Soft. Normal appearance and bowel sounds are normal. There is no hepatosplenomegaly. There is no tenderness. There is no rebound, no guarding, no tenderness at McBurney's point and negative Murphy's sign. No hernia.  Musculoskeletal: Normal range of motion.  Neurological: She is alert and oriented to person, place, and time. She has normal strength. No cranial nerve deficit or sensory deficit. Coordination normal. GCS eye subscore is 4. GCS verbal subscore is 5. GCS motor subscore is 6.  Skin: Skin is  warm, dry and intact. No rash noted. No cyanosis.  Psychiatric: Her speech is normal and behavior is normal. Thought content normal. Her mood appears anxious.  Nursing note and vitals reviewed.   ED Course  Procedures (including critical care time) Labs Review Labs Reviewed  BASIC METABOLIC PANEL - Abnormal; Notable for the following:    Creatinine, Ser 1.04 (*)    Calcium 8.5 (*)    All other components within normal limits  CBC WITH DIFFERENTIAL/PLATELET  TROPONIN I  BRAIN NATRIURETIC PEPTIDE    Imaging Review No results found.   EKG Interpretation   Date/Time:  Monday March 19 2015 16:15:09 EDT Ventricular Rate:  100 PR Interval:  169 QRS Duration: 69 QT Interval:  309 QTC Calculation: 398 R Axis:   18 Text Interpretation:  Sinus tachycardia Probable left atrial enlargement  Baseline wander in lead(s) V5 No significant change since last tracing  Confirmed by POLLINA  MD, CHRISTOPHER [phone]) on 03/19/2015 4:23:32 PM      MDM   Final diagnoses:  Asthma, mild intermittent, with acute exacerbation  Bronchitis  Anxiety    Presented for evaluation of chest pain associated with cough and shortness of breath. Patient does have a history of asthma and bronchospasm. She is extremely anxious as well. Patient improved with treatment, upper. For outpatient management.    Orpah Greek, MD 03/22/15 [phone]

## 2015-03-19 NOTE — Discharge Instructions (Signed)
Asthma Asthma is a recurring condition in which the airways tighten and narrow. Asthma can make it difficult to breathe. It can cause coughing, wheezing, and shortness of breath. Asthma episodes, also called asthma attacks, range from minor to life-threatening. Asthma cannot be cured, but medicines and lifestyle changes can help control it. CAUSES Asthma is believed to be caused by inherited (genetic) and environmental factors, but its exact cause is unknown. Asthma may be triggered by allergens, lung infections, or irritants in the air. Asthma triggers are different for each person. Common triggers include:   Animal dander.  Dust mites.  Cockroaches.  Pollen from trees or grass.  Mold.  Smoke.  Air pollutants such as dust, household cleaners, hair sprays, aerosol sprays, paint fumes, strong chemicals, or strong odors.  Cold air, weather changes, and winds (which increase molds and pollens in the air).  Strong emotional expressions such as crying or laughing hard.  Stress.  Certain medicines (such as aspirin) or types of drugs (such as beta-blockers).  Sulfites in foods and drinks. Foods and drinks that may contain sulfites include dried fruit, potato chips, and sparkling grape juice.  Infections or inflammatory conditions such as the flu, a cold, or an inflammation of the nasal membranes (rhinitis).  Gastroesophageal reflux disease (GERD).  Exercise or strenuous activity. SYMPTOMS Symptoms may occur immediately after asthma is triggered or many hours later. Symptoms include:  Wheezing.  Excessive nighttime or early morning coughing.  Frequent or severe coughing with a common cold.  Chest tightness.  Shortness of breath. DIAGNOSIS  The diagnosis of asthma is made by a review of your medical history and a physical exam. Tests may also be performed. These may include:  Lung function studies. These tests show how much air you breathe in and out.  Allergy  tests.  Imaging tests such as X-rays. TREATMENT  Asthma cannot be cured, but it can usually be controlled. Treatment involves identifying and avoiding your asthma triggers. It also involves medicines. There are 2 classes of medicine used for asthma treatment:   Controller medicines. These prevent asthma symptoms from occurring. They are usually taken every day.  Reliever or rescue medicines. These quickly relieve asthma symptoms. They are used as needed and provide short-term relief. Your health care provider will help you create an asthma action plan. An asthma action plan is a written plan for managing and treating your asthma attacks. It includes a list of your asthma triggers and how they may be avoided. It also includes information on when medicines should be taken and when their dosage should be changed. An action plan may also involve the use of a device called a peak flow meter. A peak flow meter measures how well the lungs are working. It helps you monitor your condition. HOME CARE INSTRUCTIONS   Take medicines only as directed by your health care provider. Speak with your health care provider if you have questions about how or when to take the medicines.  Use a peak flow meter as directed by your health care provider. Record and keep track of readings.  Understand and use the action plan to help minimize or stop an asthma attack without needing to seek medical care.  Control your home environment in the following ways to help prevent asthma attacks:  Do not smoke. Avoid being exposed to secondhand smoke.  Change your heating and air conditioning filter regularly.  Limit your use of fireplaces and wood stoves.  Get rid of pests (such as roaches and  mice) and their droppings.  Throw away plants if you see mold on them.  Clean your floors and dust regularly. Use unscented cleaning products.  Try to have someone else vacuum for you regularly. Stay out of rooms while they are  being vacuumed and for a short while afterward. If you vacuum, use a dust mask from a hardware store, a double-layered or microfilter vacuum cleaner bag, or a vacuum cleaner with a HEPA filter.  Replace carpet with wood, tile, or vinyl flooring. Carpet can trap dander and dust.  Use allergy-proof pillows, mattress covers, and box spring covers.  Wash bed sheets and blankets every week in hot water and dry them in a dryer.  Use blankets that are made of polyester or cotton.  Clean bathrooms and kitchens with bleach. If possible, have someone repaint the walls in these rooms with mold-resistant paint. Keep out of the rooms that are being cleaned and painted.  Wash hands frequently. SEEK MEDICAL CARE IF:   You have wheezing, shortness of breath, or a cough even if taking medicine to prevent attacks.  The colored mucus you cough up (sputum) is thicker than usual.  Your sputum changes from clear or white to yellow, green, gray, or bloody.  You have any problems that may be related to the medicines you are taking (such as a rash, itching, swelling, or trouble breathing).  You are using a reliever medicine more than 2-3 times per week.  Your peak flow is still at 50-79% of your personal best after following your action plan for 1 hour.  You have a fever. SEEK IMMEDIATE MEDICAL CARE IF:   You seem to be getting worse and are unresponsive to treatment during an asthma attack.  You are short of breath even at rest.  You get short of breath when doing very little physical activity.  You have difficulty eating, drinking, or talking due to asthma symptoms.  You develop chest pain.  You develop a fast heartbeat.  You have a bluish color to your lips or fingernails.  You are light-headed, dizzy, or faint.  Your peak flow is less than 50% of your personal best. MAKE SURE YOU:   Understand these instructions.  Will watch your condition.  Will get help right away if you are not  doing well or get worse. Document Released: 08/18/2005 Document Revised: 01/02/2014 Document Reviewed: 03/17/2013 [facility] Patient Information 2015 [place], [place]. This information is not intended to replace advice given to you by your health care provider. Make sure you discuss any questions you have with your health care provider.  Panic Attacks Panic attacks are sudden, short-livedsurges of severe anxiety, fear, or discomfort. They may occur for no reason when you are relaxed, when you are anxious, or when you are sleeping. Panic attacks may occur for a number of reasons:  Healthy people occasionally have panic attacks in extreme, life-threatening situations, such as war or natural disasters. Normal anxiety is a protective mechanism of the body that helps [place] react to danger (fight or flight response). Panic attacks are often seen with anxiety disorders, such as panic disorder, social anxiety disorder, generalized anxiety disorder, and phobias. Anxiety disorders cause excessive or uncontrollable anxiety. They may interfere with your relationships or other life activities. Panic attacks are sometimes seen with other mental illnesses, such as depression and posttraumatic stress disorder. Certain medical conditions, prescription medicines, and drugs of abuse can cause panic attacks. SYMPTOMS  Panic attacks start suddenly, peak within 20 minutes, and are accompanied  by four or more of the following symptoms: Pounding heart or fast heart rate (palpitations). Sweating. Trembling or shaking. Shortness of breath or feeling smothered. Feeling choked. Chest pain or discomfort. Nausea or strange feeling in your stomach. Dizziness, light-headedness, or feeling like you will faint. Chills or hot flushes. Numbness or tingling in your lips or hands and feet. Feeling that things are not real or feeling that you are not yourself. Fear of losing control or going crazy. Fear of dying. Some of these  symptoms can mimic serious medical conditions. For example, you may think you are having a heart attack. Although panic attacks can be very scary, they are not life threatening. DIAGNOSIS  Panic attacks are diagnosed through an assessment by your health care provider. Your health care provider will ask questions about your symptoms, such as where and when they occurred. Your health care provider will also ask about your medical history and use of alcohol and drugs, including prescription medicines. Your health care provider may order blood tests or other studies to rule out a serious medical condition. Your health care provider may refer you to a mental health professional for further evaluation. TREATMENT  Most healthy people who have one or two panic attacks in an extreme, life-threatening situation will not require treatment. The treatment for panic attacks associated with anxiety disorders or other mental illness typically involves counseling with a mental health professional, medicine, or a combination of both. Your health care provider will help determine what treatment is best for you. Panic attacks due to physical illness usually go away with treatment of the illness. If prescription medicine is causing panic attacks, talk with your health care provider about stopping the medicine, decreasing the dose, or substituting another medicine. Panic attacks due to alcohol or drug abuse go away with abstinence. Some adults need professional help in order to stop drinking or using drugs. HOME CARE INSTRUCTIONS  Take all medicines as directed by your health care provider.  Schedule and attend follow-up visits as directed by your health care provider. It is important to keep all your appointments. SEEK MEDICAL CARE IF: You are not able to take your medicines as prescribed. Your symptoms do not improve or get worse. SEEK IMMEDIATE MEDICAL CARE IF:  You experience panic attack symptoms that are different  than your usual symptoms. You have serious thoughts about hurting yourself or others. You are taking medicine for panic attacks and have a serious side effect. MAKE SURE YOU: Understand these instructions. Will watch your condition. Will get help right away if you are not doing well or get worse. Document Released: 08/18/2005 Document Revised: 08/23/2013 Document Reviewed: 04/01/2013 [facility] Patient Information 2015 [place], [place]. This information is not intended to replace advice given to you by your health care provider. Make sure you discuss any questions you have with your health care provider. Acute Bronchitis Bronchitis is inflammation of the airways that extend from the windpipe into the lungs (bronchi). The inflammation often causes mucus to develop. This leads to a cough, which is the most common symptom of bronchitis.  In acute bronchitis, the condition usually develops suddenly and goes away over time, usually in a couple weeks. Smoking, allergies, and asthma can make bronchitis worse. Repeated episodes of bronchitis may cause further lung problems.  CAUSES Acute bronchitis is most often caused by the same virus that causes a cold. The virus can spread from person to person (contagious) through coughing, sneezing, and touching contaminated objects. SIGNS AND SYMPTOMS  Cough.   Fever.   Coughing up mucus.   Body aches.   Chest congestion.   Chills.   Shortness of breath.   Sore throat.  DIAGNOSIS  Acute bronchitis is usually diagnosed through a physical exam. Your health care provider will also ask you questions about your medical history. Tests, such as chest X-rays, are sometimes done to rule out other conditions.  TREATMENT  Acute bronchitis usually goes away in a couple weeks. Oftentimes, no medical treatment is necessary. Medicines are sometimes given for relief of fever or cough. Antibiotic medicines are usually not needed but may be prescribed in certain  situations. In some cases, an inhaler may be recommended to help reduce shortness of breath and control the cough. A cool mist vaporizer may also be used to help thin bronchial secretions and make it easier to clear the chest.  HOME CARE INSTRUCTIONS  Get plenty of rest.   Drink enough fluids to keep your urine clear or pale yellow (unless you have a medical condition that requires fluid restriction). Increasing fluids may help thin your respiratory secretions (sputum) and reduce chest congestion, and it will prevent dehydration.   Take medicines only as directed by your health care provider.  If you were prescribed an antibiotic medicine, finish it all even if you start to feel better.  Avoid smoking and secondhand smoke. Exposure to cigarette smoke or irritating chemicals will make bronchitis worse. If you are a smoker, consider using nicotine gum or skin patches to help control withdrawal symptoms. Quitting smoking will help your lungs heal faster.   Reduce the chances of another bout of acute bronchitis by washing your hands frequently, avoiding people with cold symptoms, and trying not to touch your hands to your mouth, nose, or eyes.   Keep all follow-up visits as directed by your health care provider.  SEEK MEDICAL CARE IF: Your symptoms do not improve after 1 week of treatment.  SEEK IMMEDIATE MEDICAL CARE IF:  You develop an increased fever or chills.   You have chest pain.   You have severe shortness of breath.  You have bloody sputum.   You develop dehydration.  You faint or repeatedly feel like you are going to pass out.  You develop repeated vomiting.  You develop a severe headache. MAKE SURE YOU:   Understand these instructions.  Will watch your condition.  Will get help right away if you are not doing well or get worse. Document Released: 09/25/2004 Document Revised: 01/02/2014 Document Reviewed: 02/08/2013 [facility] Patient Information 2015  [place], [place]. This information is not intended to replace advice given to you by your health care provider. Make sure you discuss any questions you have with your health care provider.

## 2015-03-22 ENCOUNTER — Emergency Department (HOSPITAL_COMMUNITY)
Admission: EM | Admit: 2015-03-22 | Discharge: 2015-03-22 | Discharge status: Against Medical Advice | Payer: Self-pay | Attending: Emergency Medicine | Admitting: Emergency Medicine

## 2015-03-22 ENCOUNTER — Encounter (HOSPITAL_COMMUNITY): Payer: Self-pay

## 2015-03-22 DIAGNOSIS — S0990XA Unspecified injury of head, initial encounter: Secondary | ICD-10-CM | POA: Insufficient documentation

## 2015-03-22 DIAGNOSIS — Z720 Tobacco use: Secondary | ICD-10-CM | POA: Insufficient documentation

## 2015-03-22 DIAGNOSIS — I10 Essential (primary) hypertension: Secondary | ICD-10-CM | POA: Insufficient documentation

## 2015-03-22 DIAGNOSIS — Y929 Unspecified place or not applicable: Secondary | ICD-10-CM | POA: Insufficient documentation

## 2015-03-22 DIAGNOSIS — J45909 Unspecified asthma, uncomplicated: Secondary | ICD-10-CM | POA: Insufficient documentation

## 2015-03-22 DIAGNOSIS — Y939 Activity, unspecified: Secondary | ICD-10-CM | POA: Insufficient documentation

## 2015-03-22 DIAGNOSIS — Y999 Unspecified external cause status: Secondary | ICD-10-CM | POA: Insufficient documentation
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
## 2015-03-22 NOTE — ED Notes (Signed)
Pt left and walked down road.

## 2015-04-07 ENCOUNTER — Emergency Department (HOSPITAL_COMMUNITY): Payer: Self-pay

## 2015-04-07 ENCOUNTER — Emergency Department (HOSPITAL_COMMUNITY)
Admission: EM | Admit: 2015-04-07 | Discharge: 2015-04-07 | Disposition: A | Discharge status: Home / Self Care | Payer: Self-pay | Attending: Emergency Medicine | Admitting: Emergency Medicine

## 2015-04-07 ENCOUNTER — Encounter (HOSPITAL_COMMUNITY): Payer: Self-pay

## 2015-04-07 DIAGNOSIS — S0501XA Injury of conjunctiva and corneal abrasion without foreign body, right eye, initial encounter: Secondary | ICD-10-CM | POA: Insufficient documentation

## 2015-04-07 DIAGNOSIS — I10 Essential (primary) hypertension: Secondary | ICD-10-CM | POA: Insufficient documentation

## 2015-04-07 DIAGNOSIS — Z7952 Long term (current) use of systemic steroids: Secondary | ICD-10-CM | POA: Insufficient documentation

## 2015-04-07 DIAGNOSIS — Y998 Other external cause status: Secondary | ICD-10-CM | POA: Insufficient documentation

## 2015-04-07 DIAGNOSIS — S61412A Laceration without foreign body of left hand, initial encounter: Secondary | ICD-10-CM | POA: Insufficient documentation

## 2015-04-07 DIAGNOSIS — S199XXA Unspecified injury of neck, initial encounter: Secondary | ICD-10-CM | POA: Insufficient documentation

## 2015-04-07 DIAGNOSIS — J45909 Unspecified asthma, uncomplicated: Secondary | ICD-10-CM | POA: Insufficient documentation

## 2015-04-07 DIAGNOSIS — S299XXA Unspecified injury of thorax, initial encounter: Secondary | ICD-10-CM | POA: Insufficient documentation

## 2015-04-07 DIAGNOSIS — Y9289 Other specified places as the place of occurrence of the external cause: Secondary | ICD-10-CM | POA: Insufficient documentation

## 2015-04-07 DIAGNOSIS — Y9389 Activity, other specified: Secondary | ICD-10-CM | POA: Insufficient documentation

## 2015-04-07 DIAGNOSIS — K029 Dental caries, unspecified: Secondary | ICD-10-CM | POA: Insufficient documentation

## 2015-04-07 DIAGNOSIS — Z720 Tobacco use: Secondary | ICD-10-CM | POA: Insufficient documentation

## 2015-04-07 DIAGNOSIS — R0789 Other chest pain: Secondary | ICD-10-CM

## 2015-04-07 DIAGNOSIS — S0990XA Unspecified injury of head, initial encounter: Secondary | ICD-10-CM | POA: Insufficient documentation

## 2015-04-07 MED ORDER — PENICILLIN V POTASSIUM 250 MG PO TABS
500.0000 mg | ORAL_TABLET | Freq: Once | ORAL | Status: AC
  Administered 2015-04-07: 500 mg via ORAL
  Filled 2015-04-07: qty 2

## 2015-04-07 MED ORDER — BACITRACIN ZINC 500 UNIT/GM EX OINT
TOPICAL_OINTMENT | CUTANEOUS | Status: AC
  Administered 2015-04-07: 2
  Filled 2015-04-07: qty 1.8

## 2015-04-07 MED ORDER — PENICILLIN V POTASSIUM 500 MG PO TABS: 500.0000 mg | ORAL_TABLET | Freq: Four times a day (QID) | ORAL | Status: AC
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
## 2015-04-07 NOTE — ED Notes (Signed)
Small cut on left palm, headache, abrasion to corner of right eye.
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
## 2015-05-09 ENCOUNTER — Encounter (HOSPITAL_COMMUNITY): Payer: Self-pay | Admitting: Emergency Medicine

## 2015-05-09 ENCOUNTER — Emergency Department (HOSPITAL_COMMUNITY)
Admission: EM | Admit: 2015-05-09 | Discharge: 2015-05-10 | Disposition: A | Discharge status: Home / Self Care | Payer: Self-pay | Attending: Emergency Medicine | Admitting: Emergency Medicine

## 2015-05-09 DIAGNOSIS — J45909 Unspecified asthma, uncomplicated: Secondary | ICD-10-CM | POA: Insufficient documentation

## 2015-05-09 DIAGNOSIS — T18128A Food in esophagus causing other injury, initial encounter: Secondary | ICD-10-CM | POA: Insufficient documentation

## 2015-05-09 DIAGNOSIS — Y9389 Activity, other specified: Secondary | ICD-10-CM | POA: Insufficient documentation

## 2015-05-09 DIAGNOSIS — Z720 Tobacco use: Secondary | ICD-10-CM | POA: Insufficient documentation

## 2015-05-09 DIAGNOSIS — X58XXXA Exposure to other specified factors, initial encounter: Secondary | ICD-10-CM | POA: Insufficient documentation

## 2015-05-09 DIAGNOSIS — Z7952 Long term (current) use of systemic steroids: Secondary | ICD-10-CM | POA: Insufficient documentation

## 2015-05-09 DIAGNOSIS — Y92511 Restaurant or cafe as the place of occurrence of the external cause: Secondary | ICD-10-CM | POA: Insufficient documentation

## 2015-05-09 DIAGNOSIS — K047 Periapical abscess without sinus: Secondary | ICD-10-CM | POA: Insufficient documentation

## 2015-05-09 DIAGNOSIS — Y998 Other external cause status: Secondary | ICD-10-CM | POA: Insufficient documentation

## 2015-05-09 DIAGNOSIS — M542 Cervicalgia: Secondary | ICD-10-CM | POA: Insufficient documentation

## 2015-05-09 DIAGNOSIS — I10 Essential (primary) hypertension: Secondary | ICD-10-CM | POA: Insufficient documentation

## 2015-05-09 NOTE — ED Notes (Signed)
Pt c/o having shrimp stuck in her throat. Pt states she took a benadryl and was able to swallow that. Pt is currently drinking water at this time. Pt has etoh on board.

## 2015-05-10 MED ORDER — GLUCAGON HCL RDNA (DIAGNOSTIC) 1 MG IJ SOLR: 1.0000 mg | Freq: Once | INTRAMUSCULAR | Status: DC

## 2015-05-10 MED ORDER — GLUCAGON HCL RDNA (DIAGNOSTIC) 1 MG IJ SOLR
1.0000 mg | Freq: Once | INTRAMUSCULAR | Status: AC
  Administered 2015-05-10: 1 mg via INTRAVENOUS
  Filled 2015-05-10: qty 1

## 2015-05-10 MED ORDER — TRAMADOL HCL 50 MG PO TABS: 50.0000 mg | ORAL_TABLET | Freq: Four times a day (QID) | ORAL | Status: DC | PRN

## 2015-05-10 MED ORDER — DIAZEPAM 5 MG/ML IJ SOLN: 5.0000 mg | Freq: Once | INTRAMUSCULAR | Status: DC

## 2015-05-10 MED ORDER — AMOXICILLIN 500 MG PO CAPS: 500.0000 mg | ORAL_CAPSULE | Freq: Three times a day (TID) | ORAL | Status: DC

## 2015-05-10 NOTE — ED Notes (Signed)
Patient drinking water with no difficulty.  

## 2015-05-10 NOTE — Discharge Instructions (Signed)

## 2015-05-11 NOTE — ED Provider Notes (Signed)
CSN: [id number]     Arrival date & time 05/09/15  2303 History   First MD Initiated Contact with Patient 05/09/15 2353     Chief Complaint  Patient presents with  . Airway Obstruction     (Consider location/radiation/quality/duration/timing/severity/associated sxs/prior Treatment) The history is provided by the patient.   Kristin Carson is a 44 y.o. female presenting with foreign body sensation in her throat after eating shrimp.  She was at a [employer] popcorn shrimp when she felt one get stuck (points to her cricoid level).  She has had no difficulty breathing, denies choking, coughing or difficulty swallowing her saliva.  She was given a benadryl tablet after the event and was able to swallow this.  She has made herself cough in an effort to dislodge the food to no avail.  She denies prior similar symptoms and denies history of acid reflux disease.  She also reports having swelling around a tooth which started this past week.  She endorses having poor dentition and believes she may have a dental abscess.    Past Medical History  Diagnosis Date  . Asthma   . Hypertension   . Bronchitis   . Headache    Past Surgical History  Procedure Laterality Date  . Leg surgery    . Cesarean section    . Breast surgery     History reviewed. No pertinent family history. Social History  Substance Use Topics  . Smoking status: Current Every Day Smoker -- 0.50 packs/day    Types: Cigarettes  . Smokeless tobacco: None  . Alcohol Use: Yes     Comment: intermittent   OB History    No data available     Review of Systems  Constitutional: Negative for fever.  HENT: Positive for dental problem. Negative for congestion and sore throat.        Negative except as mentioned in HPI.    Eyes: Negative.   Respiratory: Negative for cough, choking, chest tightness, shortness of breath, wheezing and stridor.   Cardiovascular: Negative for chest pain.  Gastrointestinal: Negative for  nausea, vomiting and abdominal pain.  Genitourinary: Negative.   Musculoskeletal: Positive for neck pain. Negative for arthralgias.  Skin: Negative.  Negative for rash and wound.  Neurological: Negative for dizziness, weakness, light-headedness, numbness and headaches.  Psychiatric/Behavioral: Negative.       Allergies  Ibuprofen and Erythromycin  Home Medications   Prior to Admission medications   Medication Sig Start Date End Date Taking? Authorizing Provider  acetaminophen (TYLENOL) 500 MG tablet Take 1,000 mg by mouth every 6 (six) hours as needed for mild pain or moderate pain.    Historical Provider, MD  amoxicillin (AMOXIL) 500 MG capsule Take 1 capsule (500 mg total) by mouth 3 (three) times daily. 05/10/15   Evalee Jefferson, PA-C  HYDROcodone-acetaminophen (NORCO/VICODIN) 5-325 MG per tablet Take one tab po q 4-6 hrs prn pain 01/24/15   Tammy Triplett, PA-C  hydrOXYzine (ATARAX/VISTARIL) 25 MG tablet Take 1 tablet (25 mg total) by mouth every 6 (six) hours as needed for anxiety. 03/19/15   Orpah Greek, MD  predniSONE (DELTASONE) 20 MG tablet Take 3 tablets (60 mg total) by mouth daily with breakfast. 03/19/15   Orpah Greek, MD  traMADol (ULTRAM) 50 MG tablet Take 1 tablet (50 mg total) by mouth every 6 (six) hours as needed. 05/10/15   Evalee Jefferson, PA-C   BP 110/64 mmHg  Pulse 97  Temp(Src) 98.2 F (36.8 C) (Oral)  Resp 14  Ht 5\' 7"  (1.702 m)  Wt 180 lb (81.647 kg)  BMI 28.19 kg/m2  SpO2 98% Physical Exam  Constitutional: She appears well-developed and well-nourished.  HENT:  Head: Normocephalic and atraumatic.  Mouth/Throat: Mucous membranes are normal. No trismus in the jaw. Abnormal dentition. Dental abscesses present. No posterior oropharyngeal edema or posterior oropharyngeal erythema.    Moderate gingival edema and erythema right upper buccal side 2nd molar tooth .  Poor dentition with multiple fractures and cavities.  Eyes: Conjunctivae are normal.    Neck: Trachea normal and normal range of motion.  Cardiovascular: Normal rate, regular rhythm, normal heart sounds and intact distal pulses.   Pulmonary/Chest: Effort normal and breath sounds normal. No stridor. She has no decreased breath sounds. She has no wheezes. She has no rhonchi.  Abdominal: Soft. Bowel sounds are normal. There is no tenderness.  Musculoskeletal: Normal range of motion.  Neurological: She is alert.  Skin: Skin is warm and dry.  Psychiatric: She has a normal mood and affect.  Nursing note and vitals reviewed.   ED Course  Procedures (including critical care time) Labs Review Labs Reviewed - No data to display  Imaging Review No results found. I have personally reviewed and evaluated these images and lab results as part of my medical decision-making.   EKG Interpretation None      MDM   Final diagnoses:  Food impaction of esophagus, initial encounter  Dental abscess    Pt was given glucagon 1 mg IV after which she was able to tolerate PO fluid intake, no persistent foreign body sensation.  She was encouraged f/u with GI (referral given) if sx return or she develops a pattern of similar sx.  She was also given dental referrals.  Prescribed amoxil, tramadol for dental abscess.  Triage note etoh on board.  Pt clinically not intoxicated during this visit.  The patient appears reasonably screened and/or stabilized for discharge and I doubt any other medical condition or other [facility] requiring further screening, evaluation, or treatment in the ED at this time prior to discharge.     Evalee Jefferson, PA-C 05/11/15 [place], MD 05/12/15 [phone]

## 2015-07-21 ENCOUNTER — Encounter (HOSPITAL_COMMUNITY): Payer: Self-pay | Admitting: Emergency Medicine

## 2015-07-21 ENCOUNTER — Emergency Department (HOSPITAL_COMMUNITY)
Admission: EM | Admit: 2015-07-21 | Discharge: 2015-07-21 | Disposition: A | Discharge status: Home / Self Care | Payer: Self-pay | Attending: Emergency Medicine | Admitting: Emergency Medicine

## 2015-07-21 DIAGNOSIS — J45909 Unspecified asthma, uncomplicated: Secondary | ICD-10-CM | POA: Insufficient documentation

## 2015-07-21 DIAGNOSIS — K0889 Other specified disorders of teeth and supporting structures: Secondary | ICD-10-CM | POA: Insufficient documentation

## 2015-07-21 DIAGNOSIS — F17210 Nicotine dependence, cigarettes, uncomplicated: Secondary | ICD-10-CM | POA: Insufficient documentation

## 2015-07-21 DIAGNOSIS — R1310 Dysphagia, unspecified: Secondary | ICD-10-CM | POA: Insufficient documentation

## 2015-07-21 DIAGNOSIS — I10 Essential (primary) hypertension: Secondary | ICD-10-CM | POA: Insufficient documentation

## 2015-07-21 DIAGNOSIS — R1110 Vomiting, unspecified: Secondary | ICD-10-CM | POA: Insufficient documentation

## 2015-07-21 DIAGNOSIS — Z79899 Other long term (current) drug therapy: Secondary | ICD-10-CM | POA: Insufficient documentation

## 2015-07-21 DIAGNOSIS — R509 Fever, unspecified: Secondary | ICD-10-CM | POA: Insufficient documentation

## 2015-07-21 DIAGNOSIS — K0381 Cracked tooth: Secondary | ICD-10-CM | POA: Insufficient documentation

## 2015-07-21 DIAGNOSIS — R197 Diarrhea, unspecified: Secondary | ICD-10-CM | POA: Insufficient documentation

## 2015-07-21 MED ORDER — OXYCODONE-ACETAMINOPHEN 5-325 MG PO TABS: 1.0000 | ORAL_TABLET | ORAL | Status: DC | PRN

## 2015-07-21 MED ORDER — BUPIVACAINE HCL (PF) 0.5 % IJ SOLN
10.0000 mL | Freq: Once | INTRAMUSCULAR | Status: DC
  Filled 2015-07-21: qty 30

## 2015-07-21 MED ORDER — TRAMADOL HCL 50 MG PO TABS: 50.0000 mg | ORAL_TABLET | Freq: Four times a day (QID) | ORAL | Status: DC | PRN

## 2015-07-21 MED ORDER — OXYCODONE-ACETAMINOPHEN 5-325 MG PO TABS
1.0000 | ORAL_TABLET | Freq: Once | ORAL | Status: AC
  Administered 2015-07-21: 1 via ORAL
  Filled 2015-07-21: qty 1

## 2015-07-21 MED ORDER — ONDANSETRON 4 MG PO TBDP
4.0000 mg | ORAL_TABLET | Freq: Once | ORAL | Status: AC
  Administered 2015-07-21: 4 mg via ORAL
  Filled 2015-07-21: qty 1

## 2015-07-21 MED ORDER — LORAZEPAM 1 MG PO TABS
1.0000 mg | ORAL_TABLET | Freq: Once | ORAL | Status: AC
  Administered 2015-07-21: 1 mg via ORAL
  Filled 2015-07-21: qty 1

## 2015-07-21 MED ORDER — PENICILLIN V POTASSIUM 250 MG PO TABS
500.0000 mg | ORAL_TABLET | Freq: Once | ORAL | Status: AC
  Administered 2015-07-21: 500 mg via ORAL
  Filled 2015-07-21: qty 2

## 2015-07-21 MED ORDER — PENICILLIN V POTASSIUM 500 MG PO TABS: 500.0000 mg | ORAL_TABLET | Freq: Four times a day (QID) | ORAL | Status: AC

## 2015-07-21 NOTE — ED Notes (Signed)
Pt c/o of LT sided dental pain since last night. Pt reports v/d. Pt tearful.

## 2015-07-21 NOTE — ED Provider Notes (Signed)
CSN: [id number]     Arrival date & time 07/21/15  1054 History  By signing my name below, I, [facility], attest that this documentation has been prepared under the direction and in the presence of Virgel Manifold, MD. Electronically Signed: Virgel Bouquet, ED Scribe. 07/21/2015. 12:05 PM.    Chief Complaint  Patient presents with  . Dental Pain   The history is provided by the patient. No language interpreter was used.   HPI Comments: Kristin Carson is a 44 y.o. female who presents to the Emergency Department complaining of constant, unchanging, moderate upper and lower dental pain that began last night. Patient reports associated vomiting since 2 days ago and diarrhea last night, facial swelling, subjective fever, and difficulty swallowing and sleep disturbance secondary to pain. She has taken Tylenol with no relief. Patient has no hx of DM. Patient denies SOB. Allergic to erythromycin and ibuprofen.   Poor dentition Tenderness to percussion left upper lateral incsior and canine Left lower molar is cracked. No discrete absecess Mild left lfacial swelling Left scarring of left Tm but does not look accurtel infected  Past Medical History  Diagnosis Date  . Asthma   . Hypertension   . Bronchitis   . Headache    Past Surgical History  Procedure Laterality Date  . Leg surgery    . Cesarean section    . Breast surgery     No family history on file. Social History  Substance Use Topics  . Smoking status: Current Every Day Smoker -- 0.50 packs/day    Types: Cigarettes  . Smokeless tobacco: None  . Alcohol Use: Yes     Comment: intermittent   OB History    No data available     Review of Systems A complete 10 system review of systems was obtained and all systems are negative except as noted in the HPI and PMH.    Allergies  Ibuprofen and Erythromycin  Home Medications   Prior to Admission medications   Medication Sig Start Date End Date Taking? Authorizing  Provider  acetaminophen (TYLENOL) 500 MG tablet Take 1,000 mg by mouth every 6 (six) hours as needed for mild pain or moderate pain.   Yes Historical Provider, MD  citalopram (CELEXA) 20 MG tablet Take 20 mg by mouth daily.   Yes Historical Provider, MD  lisinopril-hydrochlorothiazide (PRINZIDE,ZESTORETIC) 10-12.5 MG tablet Take 1 tablet by mouth daily.   Yes Historical Provider, MD  traZODone (DESYREL) 100 MG tablet Take 50-100 mg by mouth at bedtime as needed for sleep.   Yes Historical Provider, MD  amoxicillin (AMOXIL) 500 MG capsule Take 1 capsule (500 mg total) by mouth 3 (three) times daily. Patient not taking: Reported on 07/21/2015 05/10/15   Evalee Jefferson, PA-C  HYDROcodone-acetaminophen (NORCO/VICODIN) 5-325 MG per tablet Take one tab po q 4-6 hrs prn pain Patient not taking: Reported on 07/21/2015 01/24/15   Tammy Triplett, PA-C  hydrOXYzine (ATARAX/VISTARIL) 25 MG tablet Take 1 tablet (25 mg total) by mouth every 6 (six) hours as needed for anxiety. Patient not taking: Reported on 07/21/2015 03/19/15   Orpah Greek, MD  predniSONE (DELTASONE) 20 MG tablet Take 3 tablets (60 mg total) by mouth daily with breakfast. Patient not taking: Reported on 07/21/2015 03/19/15   Orpah Greek, MD  traMADol (ULTRAM) 50 MG tablet Take 1 tablet (50 mg total) by mouth every 6 (six) hours as needed. Patient not taking: Reported on 07/21/2015 05/10/15   Evalee Jefferson, PA-C   BP  154/99 mmHg  Pulse 124  Temp(Src) 98.2 F (36.8 C) (Oral)  Resp 16  Ht 5\' 6"  (1.676 m)  Wt 182 lb (82.555 kg)  BMI 29.39 kg/m2  SpO2 100%  LMP 06/24/2015 Physical Exam  Constitutional: She is oriented to person, place, and time. She appears well-developed and well-nourished. No distress.  HENT:  Head: Normocephalic and atraumatic.  Eyes: Conjunctivae and EOM are normal.  Neck: Neck supple. No tracheal deviation present.  Cardiovascular: Normal rate.   Pulmonary/Chest: Effort normal. No respiratory distress.   Musculoskeletal: Normal range of motion.  Neurological: She is alert and oriented to person, place, and time.  Skin: Skin is warm and dry.  Psychiatric: She has a normal mood and affect. Her behavior is normal.  Nursing note and vitals reviewed.   ED Course  Procedures   DIAGNOSTIC STUDIES: Oxygen Saturation is 100% on RA, normal by my interpretation.    COORDINATION OF CARE: 11:35 AM Will administer dental block . Advised to follow-up with dentist. Discussed treatment plan with pt at bedside and pt agreed to plan.   Labs Review Labs Reviewed - No data to display  Imaging Review No results found. I have personally reviewed and evaluated these images and lab results as part of my medical decision-making.   EKG Interpretation None      MDM   Final diagnoses:  Toothache    44 year old female with dental pain. Agreeable to dental block. Antibiotics. As needed pain medication. Dental follow-up.  I personally preformed the services scribed in my presence. The recorded information has been reviewed is accurate. Virgel Manifold, MD.    Virgel Manifold, MD 08/04/15 2120

## 2015-07-21 NOTE — ED Notes (Signed)
MD at bedside. 

## 2015-07-21 NOTE — Discharge Instructions (Signed)
°Emergency Department Resource Guide °1) Find a Doctor and Pay Out of Pocket °Although you won't have to find out who is covered by your insurance plan, it is a good idea to ask around and get recommendations. You will then need to call the office and see if the doctor you have chosen will accept you as a new patient and what types of options they offer for patients who are self-pay. Some doctors offer discounts or will set up payment plans for their patients who do not have insurance, but you will need to ask so you aren't surprised when you get to your appointment. ° °2) Contact Your Local Health Department °Not all health departments have doctors that can see patients for sick visits, but many do, so it is worth a call to see if yours does. If you don't know where your local health department is, you can check in your phone book. The CDC also has a tool to help you locate your state's health department, and many state websites also have listings of all of their local health departments. ° °3) Find a Walk-in Clinic °If your illness is not likely to be very severe or complicated, you may want to try a walk in clinic. These are popping up all over the country in pharmacies, drugstores, and shopping centers. They're usually staffed by nurse practitioners or physician assistants that have been trained to treat common illnesses and complaints. They're usually fairly quick and inexpensive. However, if you have serious medical issues or chronic medical problems, these are probably not your best option. ° °No Primary Care Doctor: °- Call [facility] at  832-8000 - they can help you locate a primary care doctor that  accepts your insurance, provides certain services, etc. °- Physician Referral Service- 1-800-533-3463 ° °Chronic Pain Problems: °Organization         Address  Phone   Notes  °[facility]  (336) 297-2271 Patients need to be referred by their primary care doctor.  ° °Medication  Assistance: °Organization         Address  Phone   Notes  °[place] [facility] [address] °[place], [place] [address] (336) 641-8030 --Must be a resident of [place] °-- Must have NO insurance coverage whatsoever (no Medicaid/ Medicare, etc.) °-- The pt. MUST have a primary care doctor that directs their care regularly and follows them in the community °  °[facility]  (866) 331-1348   °[employer]  (888) 892-1162   ° °Agencies that provide inexpensive medical care: °Organization         Address  Phone   Notes  °[facility]  (336) 832-8035   °[facility]    (336) 832-7272   °[facility] [address] °[place], [place] [address] (336) 832-4777   °[facility] [address], °[place] (336) 271-4999   °[facility]    (336) 373-0678   °[facility]    (336) 272-1050   °[facility] ° [address], [place] Phone:  (336) 832-4444, Fax:  (336) 832-4440 Hours of Operation:  9 am - 6 pm, M-F.  Also accepts Medicaid/Medicare and self-pay.  °[facility] ° [address], [place] Phone: (336) [phone], Fax: (336) 832-3151. Hours of Operation:  8:30 am - 5:30 pm, M-F.  Also accepts Medicaid and self-pay.  °[facility] [place] [address], [place] Phone: (336) 878-6027   °[facility] [address], [place], [place] (336)723-1848, Ext. 123 Mondays & Thursdays: 7-9 AM.  First 15 patients are seen on a first come, first serve basis. °  ° °Medicaid-accepting [place] Providers: ° °Organization         Address  Phone   Notes  °[facility] [address], [place] (336) 641-2100 Also accepts self-pay patients.  °[facility] [address], [place] ° (336) 856-9996   °[facility] [address], [place]  (336) 288-8857   °[facility] [address] [place] [address], [place] (336) 299-7000   °Veita Bland [address], [place]  ° (336) 373-1557 Only accepts [place] Access Medicaid patients after they have their name applied to their card.  ° °Self-Pay (no insurance) in [place]: ° °Organization         Address  Phone   Notes  °Sickle Cell Patients, [facility] [address], [place] (336) 832-1970   °[facility] [address], [place] (336) 832-4400   °[facility] ° [address], [place] (336) 992-4800   °[facility]/Dr. Osei-Bonsu ° [address] [place] [address], [place] or [address], [place] (336) [phone] Phone number for both [place] and [place] locations is the same.  °[facility] [address], [place] (336) 299-0000   °[facility] [address] [place] [address], [place] or [address] (336) 852-7530 °(336) 878-2260   °[facility] [address], [place] (336) 350-1642, phone; (336) 294-5005, fax Sees patients 1st and 3rd Saturday of every month.  Must not qualify for public or private insurance (i.e. Medicaid, Medicare, [place] Health Choice, Veterans' Benefits) • Household income should be no more than 200% of the poverty level •The clinic cannot treat you if you are pregnant or think you are pregnant • Sexually transmitted diseases are not treated at the clinic.  ° ° °Dental Care: °Organization         Address  Phone  Notes  °[place] [facility] [address], [place] (336) 641-6152 Accepts children up to age 21 who are enrolled in Medicaid or [place] Health Choice; pregnant women with a Medicaid card; and children who have applied for Medicaid or [place] Health Choice, but were declined, whose parents can pay a reduced fee at time of service.  °[place]  [facility] [place]  [address], [place] (336) 641-7733 Accepts children up to age 21 who are enrolled in Medicaid or [place] Health Choice; pregnant women with a Medicaid card; and children who have applied for Medicaid or [place] Health Choice, but were declined, whose parents can pay a reduced fee at time of service.  °[facility] ° [address], [place] (336) 641-4533 Patients are seen by appointment only. Walk-ins are not accepted. [facility] will see patients 18 years of age and older. °Monday - Tuesday (8am-5pm) °Most Wednesdays (8:30-5pm) °$30 per visit, cash only  °[facility] ° [address], [place] (336) 641-4533 Patients are seen by appointment only. Walk-ins are not accepted. [facility] will see patients 18 years of age and older. °One   Wednesday Evening (Monthly: Volunteer Based).  $30 per visit, cash only  °[facility]  (919) 537-3737 for adults; Children under age 4, call Graduate Pediatric Dentistry at ([phone]) [phone]. Children aged 4-14, please call (919) [phone] to request a pediatric application. ° Dental services are provided in all areas of dental care including fillings, crowns and bridges, complete and partial dentures, implants, gum treatment, root canals, and extractions. Preventive care is also provided. Treatment is provided to both adults and children. °Patients are selected via a lottery and there is often a waiting list. °  °[facility] [address], °[place] ° (336) 763-8833 [email or web address] °  °[facility] [address], [place], [place] (336)723-1848, Ext. 123 Second and Fourth Thursday of each month, opens at 6:30 AM; Clinic ends at 9 AM.  Patients are seen on a first-come first-served basis, and a limited number are seen during each clinic.  ° °[facility] ° [address], [place], [place] (336) 723-7904    Eligibility Requirements °You must have lived in [place], or [place] counties for at least the last three months. °  You cannot be eligible for state or federal sponsored healthcare insurance, including Veterans Administration, Medicaid, or Medicare. °  You generally cannot be eligible for healthcare insurance through your employer.  °  How to apply: °Eligibility screenings are held every Tuesday and Wednesday afternoon from 1:00 pm until 4:00 pm. You do not need an appointment for the interview!  °[facility] [address], [place], [place] 336-631-2330   °[facility]  336-342-8273   °[facility]  336-703-3100   °[facility]  336-570-6415   ° °Behavioral Health Resources in the Community: °Intensive Outpatient Programs °Organization         Address  Phone  Notes  °[facility] [address], [place], [place] 336-878-6098   °[facility] [address], [place], [place] 336-832-9800   °[facility] [address], [place], [place] ° 336-882-2125   °[facility] [address],  °[place], [place] 1-800-853-5163 or 336-641-4981   °Substance Abuse Resources °Organization         Address  Phone  Notes  °[facility]  336-882-2125   °[facility]  336-784-9470   °[facility]  336-285-9073   °[facility]  336-845-3988   °Residential & Outpatient Substance Abuse Program  1-800-659-3381   °Psychological Services °Organization         Address  Phone  Notes  °[facility]  336- 832-9600   °[facility]  336- 378-7881   °[facility] [address], [place] 1-800-853-5163 or 336-641-4981   ° °Mobile Crisis Teams °Organization         Address  Phone  Notes  °[facility], Mobile Crisis Care Unit  1-877-626-1772   °Assertive °[facility] ° [address]  [place], [place] 336-834-9664   °Sharon DeEsch [address] °[place] 336-554-5454   ° °Self-Help/Support Groups °Organization         Address  Phone             Notes  °[facility] - variety of support groups  336- 373-1402 Call for more information  °Narcotics Anonymous (NA), [facility] [address], °[place]  2 meetings at this location  ° °  Residential Treatment Programs °Organization         Address  Phone  Notes  °[facility] [address],    °[place]  1-866-801-8205   °[facility] ° [address], [place], [place] 704-293-8524   °[facility] [address], [place] 336-845-3988 Admissions: 8am-3pm M-F  °[facility] [address]    °[place], [place] 336-841-1104   °[facility] [address], [place], [place] 336-379-7146   °[facility] [address]  °[place], [place] 336-285-9073   °[facility] - Intensive Outpatient [address], [place], [place] 336-852-3033   °[facility] [address]  °[place], [place] 1-877-615-2722 or 336-784-9470   °[facility] [address] [place], [place] 336-227-7417 Accepts Medicaid  °[facility] [address]  °[place] 1-800-659-3381 Substance Abuse/Addiction Treatment  ° °[place] Behavioral Health Resources °Organization         Address  Phone  Notes  °[facility]  (888) 581-9988   °Julie Brannon, PhD [address] [place], [place]   (336) 349-5553 or (336) 951-0000   °[facility]   [address] °[place], [place] (336) 349-4454   °[facility] [address], [place], [place] (336) 342-8316 Insurance/Medicaid/sponsorship through [facility]  °[facility] [address]                                    [place], [place] (336) 342-8316 Therapy/tele-psych/case    °[facility] [address]  ° [place] (336) 349-2233    °Dr. Arfeen  (336) 349-4544   °[facility] [place]  [employer] [place] [facility] 1) [address], [place] °2) [address], [place] °3)  [address], [place] (336) 349-3220 °(336) 342-7768 ° °(336) 342-8140   °[place] [facility] (336) 342-1394 or (336) 342-3537 (After Hours)    ° ° °

## 2015-12-10 ENCOUNTER — Emergency Department (HOSPITAL_COMMUNITY)
Admission: EM | Admit: 2015-12-10 | Discharge: 2015-12-10 | Disposition: A | Discharge status: Home / Self Care | Payer: Self-pay | Attending: Emergency Medicine | Admitting: Emergency Medicine

## 2015-12-10 ENCOUNTER — Emergency Department (HOSPITAL_COMMUNITY): Payer: Self-pay

## 2015-12-10 ENCOUNTER — Encounter (HOSPITAL_COMMUNITY): Payer: Self-pay | Admitting: *Deleted

## 2015-12-10 DIAGNOSIS — J45909 Unspecified asthma, uncomplicated: Secondary | ICD-10-CM | POA: Insufficient documentation

## 2015-12-10 DIAGNOSIS — L089 Local infection of the skin and subcutaneous tissue, unspecified: Secondary | ICD-10-CM

## 2015-12-10 DIAGNOSIS — J45901 Unspecified asthma with (acute) exacerbation: Secondary | ICD-10-CM

## 2015-12-10 DIAGNOSIS — L739 Follicular disorder, unspecified: Secondary | ICD-10-CM | POA: Insufficient documentation

## 2015-12-10 DIAGNOSIS — F17210 Nicotine dependence, cigarettes, uncomplicated: Secondary | ICD-10-CM | POA: Insufficient documentation

## 2015-12-10 DIAGNOSIS — Z79899 Other long term (current) drug therapy: Secondary | ICD-10-CM | POA: Insufficient documentation

## 2015-12-10 DIAGNOSIS — I10 Essential (primary) hypertension: Secondary | ICD-10-CM | POA: Insufficient documentation

## 2015-12-10 DIAGNOSIS — L02412 Cutaneous abscess of left axilla: Secondary | ICD-10-CM | POA: Insufficient documentation

## 2015-12-10 DIAGNOSIS — L723 Sebaceous cyst: Secondary | ICD-10-CM

## 2015-12-10 DIAGNOSIS — J020 Streptococcal pharyngitis: Secondary | ICD-10-CM | POA: Insufficient documentation

## 2015-12-10 LAB — RAPID STREP SCREEN (MED CTR MEBANE ONLY): Streptococcus, Group A Screen (Direct): POSITIVE — AB

## 2015-12-10 MED ORDER — HYDROCODONE-ACETAMINOPHEN 5-325 MG PO TABS
1.0000 | ORAL_TABLET | Freq: Once | ORAL | Status: AC
  Administered 2015-12-10: 1 via ORAL
  Filled 2015-12-10: qty 1

## 2015-12-10 MED ORDER — AMOXICILLIN 500 MG PO CAPS: 500.0000 mg | ORAL_CAPSULE | Freq: Three times a day (TID) | ORAL | Status: AC

## 2015-12-10 MED ORDER — PREDNISONE 10 MG PO TABS: ORAL_TABLET | ORAL | Status: DC

## 2015-12-10 MED ORDER — ALBUTEROL SULFATE (2.5 MG/3ML) 0.083% IN NEBU: 1.7500 mg | INHALATION_SOLUTION | Freq: Once | RESPIRATORY_TRACT | Status: DC

## 2015-12-10 MED ORDER — PREDNISONE 10 MG PO TABS
60.0000 mg | ORAL_TABLET | Freq: Once | ORAL | Status: AC
  Administered 2015-12-10: 60 mg via ORAL
  Filled 2015-12-10: qty 1

## 2015-12-10 MED ORDER — HYDROCODONE-ACETAMINOPHEN 5-325 MG PO TABS: 1.0000 | ORAL_TABLET | ORAL | Status: DC | PRN

## 2015-12-10 MED ORDER — ALBUTEROL SULFATE (2.5 MG/3ML) 0.083% IN NEBU
2.5000 mg | INHALATION_SOLUTION | Freq: Once | RESPIRATORY_TRACT | Status: AC
  Administered 2015-12-10: 2.5 mg via RESPIRATORY_TRACT
  Filled 2015-12-10: qty 3

## 2015-12-10 MED ORDER — ALBUTEROL SULFATE HFA 108 (90 BASE) MCG/ACT IN AERS
2.0000 | INHALATION_SPRAY | Freq: Once | RESPIRATORY_TRACT | Status: AC
  Administered 2015-12-10: 2 via RESPIRATORY_TRACT
  Filled 2015-12-10: qty 6.7

## 2015-12-10 NOTE — ED Notes (Signed)
Pt comes in with cough and sore throat starting on Friday, pt denies any n/v/d. Pt also has abscess on her left underarm.

## 2015-12-10 NOTE — Discharge Instructions (Signed)
Asthma, Adult Asthma is a condition of the lungs in which the airways tighten and narrow. Asthma can make it hard to breathe. Asthma cannot be cured, but medicine and lifestyle changes can help control it. Asthma may be started (triggered) by:  Animal skin flakes (dander).  Dust.  Cockroaches.  Pollen.  Mold.  Smoke.  Cleaning products.  Hair sprays or aerosol sprays.  Paint fumes or strong smells.  Cold air, weather changes, and winds.  Crying or laughing hard.  Stress.  Certain medicines or drugs.  Foods, such as dried fruit, potato chips, and sparkling grape juice.  Infections or conditions (colds, flu).  Exercise.  Certain medical conditions or diseases.  Exercise or tiring activities. HOME CARE   Take medicine as told by your doctor.  Use a peak flow meter as told by your doctor. A peak flow meter is a tool that measures how well the lungs are working.  Record and keep track of the peak flow meter's readings.  Understand and use the asthma action plan. An asthma action plan is a written plan for taking care of your asthma and treating your attacks.  To help prevent asthma attacks:  Do not smoke. Stay away from secondhand smoke.  Change your heating and air conditioning filter often.  Limit your use of fireplaces and wood stoves.  Get rid of pests (such as roaches and mice) and their droppings.  Throw away plants if you see mold on them.  Clean your floors. Dust regularly. Use cleaning products that do not smell.  Have someone vacuum when you are not home. Use a vacuum cleaner with a HEPA filter if possible.  Replace carpet with wood, tile, or vinyl flooring. Carpet can trap animal skin flakes and dust.  Use allergy-proof pillows, mattress covers, and box spring covers.  Wash bed sheets and blankets every week in hot water and dry them in a dryer.  Use blankets that are made of polyester or cotton.  Clean bathrooms and kitchens with bleach.  If possible, have someone repaint the walls in these rooms with mold-resistant paint. Keep out of the rooms that are being cleaned and painted.  Wash hands often. GET HELP IF:  You have make a whistling sound when breaking (wheeze), have shortness of breath, or have a cough even if taking medicine to prevent attacks.  The colored mucus you cough up (sputum) is thicker than usual.  The colored mucus you cough up changes from clear or white to yellow, green, gray, or bloody.  You have problems from the medicine you are taking such as:  A rash.  Itching.  Swelling.  Trouble breathing.  You need reliever medicines more than 2-3 times a week.  Your peak flow measurement is still at 50-79% of your personal best after following the action plan for 1 hour.  You have a fever. GET HELP RIGHT AWAY IF:   You seem to be worse and are not responding to medicine during an asthma attack.  You are short of breath even at rest.  You get short of breath when doing very little activity.  You have trouble eating, drinking, or talking.  You have chest pain.  You have a fast heartbeat.  Your lips or fingernails start to turn blue.  You are light-headed, dizzy, or faint.  Your peak flow is less than 50% of your personal best.   This information is not intended to replace advice given to you by your health care provider. Make sure  you discuss any questions you have with your health care provider.   Document Released: 02/04/2008 Document Revised: 05/09/2015 Document Reviewed: 03/17/2013 Elsevier Interactive Patient Education 2016 [place].  Epidermal Cyst An epidermal cyst is sometimes called a sebaceous cyst, epidermal inclusion cyst, or infundibular cyst. These cysts usually contain a substance that looks "pasty" or "cheesy" and may have a bad smell. This substance is a protein called keratin. Epidermal cysts are usually found on the face, neck, or trunk. They may also occur in the  vaginal area or other parts of the genitalia of both men and women. Epidermal cysts are usually small, painless, slow-growing bumps or lumps that move freely under the skin. It is important not to try to pop them. This may cause an infection and lead to tenderness and swelling. CAUSES  Epidermal cysts may be caused by a deep penetrating injury to the skin or a plugged hair follicle, often associated with acne. SYMPTOMS  Epidermal cysts can become inflamed and cause:  Redness.  Tenderness.  Increased temperature of the skin over the bumps or lumps.  Grayish-white, bad smelling material that drains from the bump or lump. DIAGNOSIS  Epidermal cysts are easily diagnosed by your caregiver during an exam. Rarely, a tissue sample (biopsy) may be taken to rule out other conditions that may resemble epidermal cysts. TREATMENT   Epidermal cysts often get better and disappear on their own. They are rarely ever cancerous.  If a cyst becomes infected, it may become inflamed and tender. This may require opening and draining the cyst. Treatment with antibiotics may be necessary. When the infection is gone, the cyst may be removed with minor surgery.  Small, inflamed cysts can often be treated with antibiotics or by injecting steroid medicines.  Sometimes, epidermal cysts become large and bothersome. If this happens, surgical removal in your caregiver's office may be necessary. HOME CARE INSTRUCTIONS  Only take over-the-counter or prescription medicines as directed by your caregiver.  Take your antibiotics as directed. Finish them even if you start to feel better. SEEK MEDICAL CARE IF:   Your cyst becomes tender, red, or swollen.  Your condition is not improving or is getting worse.  You have any other questions or concerns. MAKE SURE YOU:  Understand these instructions.  Will watch your condition.  Will get help right away if you are not doing well or get worse. Strep Throat Strep throat  is a bacterial infection of the throat. Your health care provider may call the infection tonsillitis or pharyngitis, depending on whether there is swelling in the tonsils or at the back of the throat. Strep throat is most common during the cold months of the year in children who are 78-83 years of age, but it can happen during any season in people of any age. This infection is spread from person to person (contagious) through coughing, sneezing, or close contact. CAUSES Strep throat is caused by the bacteria called Streptococcus pyogenes. RISK FACTORS This condition is more likely to develop in: People who spend time in crowded places where the infection can spread easily. People who have close contact with someone who has strep throat. SYMPTOMS Symptoms of this condition include: Fever or chills.  Redness, swelling, or pain in the tonsils or throat. Pain or difficulty when swallowing. White or yellow spots on the tonsils or throat. Swollen, tender glands in the neck or under the jaw. Red rash all over the body (rare). DIAGNOSIS This condition is diagnosed by performing a rapid strep  test or by taking a swab of your throat (throat culture test). Results from a rapid strep test are usually ready in a few minutes, but throat culture test results are available after one or two days. TREATMENT This condition is treated with antibiotic medicine. HOME CARE INSTRUCTIONS Medicines Take over-the-counter and prescription medicines only as told by your health care provider. Take your antibiotic as told by your health care provider. Do not stop taking the antibiotic even if you start to feel better. Have family members who also have a sore throat or fever tested for strep throat. They may need antibiotics if they have the strep infection. Eating and Drinking Do not share food, drinking cups, or personal items that could cause the infection to spread to other people. If swallowing is difficult, try  eating soft foods until your sore throat feels better. Drink enough fluid to keep your urine clear or pale yellow. General Instructions Gargle with a salt-water mixture 3-4 times per day or as needed. To make a salt-water mixture, completely dissolve -1 tsp of salt in 1 cup of warm water. Make sure that all household members wash their hands well. Get plenty of rest. Stay home from school or work until you have been taking antibiotics for 24 hours. Keep all follow-up visits as told by your health care provider. This is important. SEEK MEDICAL CARE IF: The glands in your neck continue to get bigger. You develop a rash, cough, or earache. You cough up a thick liquid that is green, yellow-brown, or bloody. You have pain or discomfort that does not get better with medicine. Your problems seem to be getting worse rather than better. You have a fever. SEEK IMMEDIATE MEDICAL CARE IF: You have new symptoms, such as vomiting, severe headache, stiff or painful neck, chest pain, or shortness of breath. You have severe throat pain, drooling, or changes in your voice. You have swelling of the neck, or the skin on the neck becomes red and tender. You have signs of dehydration, such as fatigue, dry mouth, and decreased urination. You become increasingly sleepy, or you cannot wake up completely. Your joints become red or painful.   This information is not intended to replace advice given to you by your health care provider. Make sure you discuss any questions you have with your health care provider.   Document Released: 08/15/2000 Document Revised: 05/09/2015 Document Reviewed: 12/11/2014 Elsevier Interactive Patient Education 2016 [place] your next dose of the antibiotic tonight and your next dose of prednisone (this is to help your wheezeing) tomorrow morning.  Use 2 puffs of your inhaler every 4 hours if you are coughing or wheezing.  Apply warm compresses and and continue gentle  massage of the sebaceous cyst to keep it draining as discussed.  You may take the hydrocodone prescribed for pain relief.  This will make you drowsy - do not drive within 4 hours of taking this medication.     This information is not intended to replace advice given to you by your health care provider. Make sure you discuss any questions you have with your health care provider.   Document Released: 07/19/2004 Document Revised: 11/10/2011 Document Reviewed: 02/24/2011 Elsevier Interactive Patient Education [employer].

## 2015-12-10 NOTE — ED Provider Notes (Signed)
CSN: [id number]     Arrival date & time 12/10/15  1048 History  By signing my name below, I, Eustaquio Maize, attest that this documentation has been prepared under the direction and in the presence of Evalee Jefferson, PA-C. Electronically Signed: Eustaquio Maize, ED Scribe. 12/10/2015. 12:33 PM.   Chief Complaint  Patient presents with  . Cough  . Abscess   The history is provided by the patient. No language interpreter was used.     HPI Comments: Kristin Carson is a 45 y.o. female with PMHx HTN and Asthma who presents to the Emergency Department complaining of gradual onset, constant, cough x 4 days. Pt also complains of wheezing and a left sided sore throat. Pt had a breathing treatment in the ED with some relief but still reports feeling mildly short of breath. Pt complains of an area of redness, swelling, and pain to her left axilla as well. She has been applying warm compresses without relief. Denies any drainage to the area. Pt had an abscess to the same area approximately 3 weeks ago that drained on its own. Pt mentions having an abscess to her left breast that was drained in the OR a couple of years ago. She denies ever being told that her cultures were positive for MRSA. Denies fever, chills, or any other associated symptoms.   Past Medical History  Diagnosis Date  . Asthma   . Hypertension   . Bronchitis   . Headache    Past Surgical History  Procedure Laterality Date  . Leg surgery    . Cesarean section    . Breast surgery     No family history on file. Social History  Substance Use Topics  . Smoking status: Current Every Day Smoker -- 0.50 packs/day    Types: Cigarettes  . Smokeless tobacco: None  . Alcohol Use: Yes     Comment: intermittent   OB History    No data available     Review of Systems  Constitutional: Negative for fever and chills.  HENT: Positive for sore throat. Negative for congestion, ear pain, rhinorrhea, sinus pressure, trouble swallowing and voice  change.   Eyes: Negative for discharge.  Respiratory: Positive for cough, shortness of breath and wheezing. Negative for stridor.   Cardiovascular: Negative for chest pain.  Gastrointestinal: Negative for abdominal pain.  Genitourinary: Negative.   Skin:       + Abscess to left axilla - Drainage   Allergies  Ibuprofen; Tramadol; and Erythromycin  Home Medications   Prior to Admission medications   Medication Sig Start Date End Date Taking? Authorizing Provider  acetaminophen (TYLENOL) 500 MG tablet Take 1,000 mg by mouth every 6 (six) hours as needed for mild pain or moderate pain.   Yes Historical Provider, MD  citalopram (CELEXA) 20 MG tablet Take 20 mg by mouth daily.   Yes Historical Provider, MD  lisinopril-hydrochlorothiazide (PRINZIDE,ZESTORETIC) 10-12.5 MG tablet Take 1 tablet by mouth daily.   Yes Historical Provider, MD  traZODone (DESYREL) 100 MG tablet Take 50-100 mg by mouth at bedtime as needed for sleep.   Yes Historical Provider, MD  amoxicillin (AMOXIL) 500 MG capsule Take 1 capsule (500 mg total) by mouth 3 (three) times daily. 12/10/15 12/20/15  Evalee Jefferson, PA-C  HYDROcodone-acetaminophen (NORCO/VICODIN) 5-325 MG tablet Take 1 tablet by mouth every 4 (four) hours as needed. 12/10/15   Evalee Jefferson, PA-C  hydrOXYzine (ATARAX/VISTARIL) 25 MG tablet Take 1 tablet (25 mg total) by mouth every 6 (six)  hours as needed for anxiety. Patient not taking: Reported on 07/21/2015 03/19/15   Orpah Greek, MD  predniSONE (DELTASONE) 10 MG tablet 5, 4, 3, 2 then 1 tablet by mouth daily for 6 days total. 12/11/15   Evalee Jefferson, PA-C  traMADol (ULTRAM) 50 MG tablet Take 1 tablet (50 mg total) by mouth every 6 (six) hours as needed. Patient not taking: Reported on 12/10/2015 07/21/15   Virgel Manifold, MD   BP 124/79 mmHg  Pulse 82  Temp(Src) 98.2 F (36.8 C) (Oral)  Resp 18  Ht 5\' 6"  (1.676 m)  Wt 81.647 kg  BMI 29.07 kg/m2  SpO2 99%  LMP 11/15/2015   Physical Exam   Constitutional: She is oriented to person, place, and time. She appears well-developed and well-nourished.  HENT:  Head: Normocephalic and atraumatic.  Right Ear: Tympanic membrane and ear canal normal.  Left Ear: Tympanic membrane and ear canal normal.  Mouth/Throat: Uvula is midline and mucous membranes are normal. Posterior oropharyngeal edema and posterior oropharyngeal erythema present. No oropharyngeal exudate or tonsillar abscesses.  Bilateral tonsillar edema and erythema without exudate. Tonsils are uniformly enlarged.  No tonsillar adenopathy.   Eyes: Conjunctivae are normal.  Cardiovascular: Normal rate and normal heart sounds.   Pulmonary/Chest: Effort normal. No respiratory distress. She has no wheezes. She has no rales.  Adequate breath sounds throughout after nebulizer treatment. No wheeze.  Abdominal: Soft. There is no tenderness.  Musculoskeletal: Normal range of motion.  Lymphadenopathy:       Head (right side): No tonsillar adenopathy present.       Head (left side): No tonsillar adenopathy present.  Neurological: She is alert and oriented to person, place, and time.  Skin: Skin is warm and dry. No rash noted.  Infected sebaceous cyst to left axilla with approximately 2 cm of induration with no surrounding erythema.  Central pointing pustule that's not yet draining.   Psychiatric: She has a normal mood and affect.    ED Course  Procedures (including critical care time)  Pt deferred full I and D but was willing to allow needle aspiration/ "de roofing" of the pustule top.  Alcohol prep, followed by #18 needle to the pustule, obtained moderate purulence and a rather large sebum plug. Pt tolerated well.  Site smaller at dc.  Dressing applied   DIAGNOSTIC STUDIES: Oxygen Saturation is 100% on RA, normal by my interpretation.    COORDINATION OF CARE: 12:18 PM-Discussed treatment plan with pt at bedside and pt agreed to plan.   Labs Review Labs Reviewed  RAPID  STREP SCREEN (NOT AT [facility]) - Abnormal; Notable for the following:    Streptococcus, Group A Screen (Direct) POSITIVE (*)    All other components within normal limits  CULTURE, ROUTINE-ABSCESS    Imaging Review No results found.   EKG Interpretation None      MDM   Final diagnoses:  Strep throat  Infected sebaceous cyst  Asthma attack    No wheezing at time of dc, pt in no respiratory distress.  She was given an albuterol mdi as she ran out today.  Prescribed prednisone taper, amoxil for strep infection. No cellulitis at cyst/abscess site, abx not indicated.  Warm soaks advised prn f/u if sx persist/worsen.  I personally performed the services described in this documentation, which was scribed in my presence. The recorded information has been reviewed and is accurate.     Evalee Jefferson, PA-C 12/12/15 2209  Fredia Sorrow, MD 12/13/15 1718

## 2015-12-13 LAB — CULTURE, ROUTINE-ABSCESS: GRAM STAIN: NONE SEEN

## 2015-12-14 ENCOUNTER — Telehealth: Payer: Self-pay

## 2015-12-14 NOTE — Progress Notes (Signed)
ED Antimicrobial Stewardship Positive Culture Follow Up   Kristin Carson is an 45 y.o. female who presented to [facility] on 12/10/2015 with a chief complaint of  Chief Complaint  Patient presents with  . Cough  . Abscess    Recent Results (from the past 720 hour(s))  Culture, routine-abscess     Status: None   Collection Time: 12/10/15 12:30 PM  Result Value Ref Range Status   Specimen Description ABSCESS LEFT AXILLA  Final   Special Requests NONE  Final   Gram Stain   Final    NO WBC SEEN NO SQUAMOUS EPITHELIAL CELLS SEEN MODERATE GRAM POSITIVE COCCI IN PAIRS RARE GRAM NEGATIVE RODS Performed at [facility]    Culture   Final    MULTIPLE ORGANISMS PRESENT, NONE PREDOMINANT Note: NO STAPHYLOCOCCUS AUREUS ISOLATED NO GROUP A STREP (S.PYOGENES) ISOLATED Performed at [facility]    Report Status 12/13/2015 FINAL  Final  Rapid strep screen     Status: Abnormal   Collection Time: 12/10/15 12:45 PM  Result Value Ref Range Status   Streptococcus, Group A Screen (Direct) POSITIVE (A) NEGATIVE Final   Pt presented with cough and sore throat, and with abscess under lift arm. Abscess was aspirated with needle and no abx were indicated per ED PA. Will follow up c/s and treat accordingly.   ED Provider: Arlean Hopping, PA-C  [place] Lennox Grumbles, PharmD Pharmacy Resident  Pager: [phone] 12/14/2015 9:53 AM

## 2015-12-14 NOTE — Telephone Encounter (Signed)
Post ED Visit - Positive Culture Follow-up  Culture report reviewed by antimicrobial stewardship pharmacist:  []  Elenor Quinones, Pharm.D. []  Heide Guile, Pharm.D., BCPS []  Parks Neptune, Pharm.D. []  Alycia Rossetti, Pharm.D., BCPS []  [place], Pharm.D., BCPS, AAHIVP []  Legrand Como, Pharm.D., BCPS, AAHIVP []  Milus Glazier, Pharm.D. []  Stephens November, Pharm.D. Almyra Deforest, PharmD Positive abcessPost ED Visit - Positive Culture Follow-up  Culture report reviewed by antimicrobial stewardship pharmacist:  []  Elenor Quinones, Pharm.D. []  Heide Guile, Pharm.D., BCPS []  Parks Neptune, Pharm.D. []  Alycia Rossetti, Pharm.D., BCPS []  [place], Pharm.D., BCPS, AAHIVP []  Legrand Como, Pharm.D., BCPS, AAHIVP []  Milus Glazier, Pharm.D. []  Stephens November, [place].D. Almyra Deforest PharmD Positive abcess culture Treated with amoxicillin, pharmacy will F/U with culture.Genia Del    12/14/2015, 11:21 AM

## 2016-02-17 ENCOUNTER — Other Ambulatory Visit: Payer: Self-pay

## 2016-02-17 ENCOUNTER — Emergency Department (HOSPITAL_COMMUNITY)
Admission: EM | Admit: 2016-02-17 | Discharge: 2016-02-17 | Disposition: A | Discharge status: Home / Self Care | Payer: Self-pay | Attending: Emergency Medicine | Admitting: Emergency Medicine

## 2016-02-17 ENCOUNTER — Encounter (HOSPITAL_COMMUNITY): Payer: Self-pay | Admitting: *Deleted

## 2016-02-17 ENCOUNTER — Emergency Department (HOSPITAL_COMMUNITY): Payer: Self-pay

## 2016-02-17 DIAGNOSIS — J45909 Unspecified asthma, uncomplicated: Secondary | ICD-10-CM | POA: Insufficient documentation

## 2016-02-17 DIAGNOSIS — K0889 Other specified disorders of teeth and supporting structures: Secondary | ICD-10-CM

## 2016-02-17 DIAGNOSIS — F10220 Alcohol dependence with intoxication, uncomplicated: Secondary | ICD-10-CM | POA: Insufficient documentation

## 2016-02-17 DIAGNOSIS — R0789 Other chest pain: Secondary | ICD-10-CM | POA: Insufficient documentation

## 2016-02-17 DIAGNOSIS — I10 Essential (primary) hypertension: Secondary | ICD-10-CM | POA: Insufficient documentation

## 2016-02-17 DIAGNOSIS — F10920 Alcohol use, unspecified with intoxication, uncomplicated: Secondary | ICD-10-CM

## 2016-02-17 DIAGNOSIS — K029 Dental caries, unspecified: Secondary | ICD-10-CM | POA: Insufficient documentation

## 2016-02-17 DIAGNOSIS — Z79899 Other long term (current) drug therapy: Secondary | ICD-10-CM | POA: Insufficient documentation

## 2016-02-17 DIAGNOSIS — F17210 Nicotine dependence, cigarettes, uncomplicated: Secondary | ICD-10-CM | POA: Insufficient documentation

## 2016-02-17 LAB — URINALYSIS, ROUTINE W REFLEX MICROSCOPIC
Bilirubin Urine: NEGATIVE
GLUCOSE, UA: NEGATIVE mg/dL
KETONES UR: NEGATIVE mg/dL
Nitrite: NEGATIVE
PH: 5.5 (ref 5.0–8.0)
PROTEIN: NEGATIVE mg/dL
Specific Gravity, Urine: <1.005 — ABNORMAL LOW (ref 1.005–1.030)

## 2016-02-17 LAB — URINE MICROSCOPIC-ADD ON: Bacteria, UA: NONE SEEN

## 2016-02-17 LAB — RAPID URINE DRUG SCREEN, HOSP PERFORMED
AMPHETAMINES: NOT DETECTED
Barbiturates: NOT DETECTED
Benzodiazepines: NOT DETECTED
Cocaine: NOT DETECTED
OPIATES: NOT DETECTED
Tetrahydrocannabinol: NOT DETECTED

## 2016-02-17 LAB — CBC
HCT: 41.5 % (ref 36.0–46.0)
Hemoglobin: 13.6 g/dL (ref 12.0–15.0)
MCH: 27.6 pg (ref 26.0–34.0)
MCHC: 32.8 g/dL (ref 30.0–36.0)
MCV: 84.3 fL (ref 78.0–100.0)
PLATELETS: 299 10*3/uL (ref 150–400)
RBC: 4.92 MIL/uL (ref 3.87–5.11)
RDW: 16.4 % — AB (ref 11.5–15.5)
WBC: 9.7 10*3/uL (ref 4.0–10.5)

## 2016-02-17 LAB — BASIC METABOLIC PANEL
Anion gap: 9 (ref 5–15)
BUN: 14 mg/dL (ref 6–20)
CALCIUM: 8.7 mg/dL — AB (ref 8.9–10.3)
CO2: 21 mmol/L — ABNORMAL LOW (ref 22–32)
CREATININE: 1.03 mg/dL — AB (ref 0.44–1.00)
Chloride: 106 mmol/L (ref 101–111)
GFR calc Af Amer: >60 mL/min (ref >60)
Glucose, Bld: 90 mg/dL (ref 65–99)
POTASSIUM: 4.1 mmol/L (ref 3.5–5.1)
SODIUM: 136 mmol/L (ref 135–145)

## 2016-02-17 LAB — TROPONIN I

## 2016-02-17 LAB — PREGNANCY, URINE: PREG TEST UR: NEGATIVE

## 2016-02-17 LAB — ETHANOL: ALCOHOL ETHYL (B): 294 mg/dL — AB (ref <5)

## 2016-02-17 MED ORDER — AMOXICILLIN 500 MG PO CAPS: 500.0000 mg | ORAL_CAPSULE | Freq: Three times a day (TID) | ORAL | Status: DC

## 2016-02-17 MED ORDER — ACETAMINOPHEN 500 MG PO TABS
1000.0000 mg | ORAL_TABLET | Freq: Once | ORAL | Status: DC
  Filled 2016-02-17: qty 2

## 2016-02-17 MED ORDER — SODIUM CHLORIDE 0.9 % IV BOLUS (SEPSIS)
1000.0000 mL | Freq: Once | INTRAVENOUS | Status: AC
  Administered 2016-02-17: 1000 mL via INTRAVENOUS

## 2016-02-17 MED ORDER — SODIUM CHLORIDE 0.9 % IV SOLN
INTRAVENOUS | Status: DC
  Administered 2016-02-17: 10:00:00 via INTRAVENOUS

## 2016-02-17 MED ORDER — AMOXICILLIN 250 MG PO CAPS
500.0000 mg | ORAL_CAPSULE | Freq: Once | ORAL | Status: AC
  Administered 2016-02-17: 500 mg via ORAL
  Filled 2016-02-17: qty 2
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
## 2016-02-17 NOTE — ED Provider Notes (Signed)
Patient is now sober. Her chief concern is pain in the right central upper incisor. Rx amoxicillin 500 mg 3 times a day. No chest pain or dyspnea. She admits to overdrinking last night  Nat Christen, MD 02/17/16 1119

## 2016-02-17 NOTE — ED Notes (Signed)
Pt woke up briefly to urinate on bed pan

## 2016-02-17 NOTE — ED Notes (Signed)
Dr Thurnell Garbe aware of bp, additional orders given

## 2016-02-17 NOTE — Discharge Instructions (Signed)
Prescription for antibiotic for tooth. Phone number for dental referral. Take Tylenol for pain.
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
## 2016-02-17 NOTE — ED Notes (Signed)
Pt will arouse with stimuli, attempt to push staff away,

## 2016-03-31 ENCOUNTER — Emergency Department (HOSPITAL_COMMUNITY)
Admission: EM | Admit: 2016-03-31 | Discharge: 2016-03-31 | Disposition: A | Discharge status: Home / Self Care | Payer: Self-pay | Attending: Emergency Medicine | Admitting: Emergency Medicine

## 2016-03-31 ENCOUNTER — Encounter (HOSPITAL_COMMUNITY): Payer: Self-pay | Admitting: *Deleted

## 2016-03-31 DIAGNOSIS — Z79899 Other long term (current) drug therapy: Secondary | ICD-10-CM | POA: Insufficient documentation

## 2016-03-31 DIAGNOSIS — F17210 Nicotine dependence, cigarettes, uncomplicated: Secondary | ICD-10-CM | POA: Insufficient documentation

## 2016-03-31 DIAGNOSIS — M545 Low back pain, unspecified: Secondary | ICD-10-CM

## 2016-03-31 DIAGNOSIS — I10 Essential (primary) hypertension: Secondary | ICD-10-CM | POA: Insufficient documentation

## 2016-03-31 DIAGNOSIS — J45909 Unspecified asthma, uncomplicated: Secondary | ICD-10-CM | POA: Insufficient documentation

## 2016-03-31 LAB — POC URINE PREG, ED: PREG TEST UR: NEGATIVE

## 2016-03-31 MED ORDER — CYCLOBENZAPRINE HCL 10 MG PO TABS: 10.0000 mg | ORAL_TABLET | Freq: Three times a day (TID) | ORAL | 0 refills | Status: DC | PRN

## 2016-03-31 MED ORDER — DEXAMETHASONE SODIUM PHOSPHATE 10 MG/ML IJ SOLN
10.0000 mg | Freq: Once | INTRAMUSCULAR | Status: AC
  Administered 2016-03-31: 10 mg via INTRAMUSCULAR
  Filled 2016-03-31: qty 1

## 2016-03-31 MED ORDER — PREDNISONE 20 MG PO TABS: ORAL_TABLET | ORAL | 0 refills | Status: DC

## 2016-03-31 MED ORDER — DIAZEPAM 5 MG/ML IJ SOLN
5.0000 mg | Freq: Once | INTRAMUSCULAR | Status: AC
  Administered 2016-03-31: 5 mg via INTRAMUSCULAR
  Filled 2016-03-31: qty 2

## 2016-03-31 NOTE — ED Provider Notes (Signed)
[place] DEPT Provider Note   CSN: [id number] Arrival date & time: 03/31/16  0019  First Provider Contact:  02:10 AM    History   Chief Complaint Chief Complaint  Patient presents with  . Back Pain    HPI Kristin Carson is a 45 y.o. female.  HPI  Patient reports she woke up this morning on July 30 with lower back pain that radiates into her right knee. She also has numbness that radiates into her knee. She states the pain is intermittent and describes it as a toothache type pain. She states only last a few seconds at a time. She states she's never had it before. She denies any change in activity or any injury. She denies any urinary or fecal incontinence. She states laying on her stomach makes it feel better, nothing makes it feel worse.  PCP [facility] Department   Past Medical History:  Diagnosis Date  . Asthma   . Bronchitis   . Headache   . Hypertension     Patient Active Problem List   Diagnosis Date Noted  . SOB (shortness of breath) 04/07/2013  . Migraine 04/07/2013    Past Surgical History:  Procedure Laterality Date  . BREAST SURGERY    . CESAREAN SECTION    . LEG SURGERY      OB History    No data available       Home Medications    Prior to Admission medications   Medication Sig Start Date End Date Taking? Authorizing Provider  acetaminophen (TYLENOL) 500 MG tablet Take 1,000 mg by mouth every 6 (six) hours as needed for mild pain or moderate pain.   Yes Historical Provider, MD  citalopram (CELEXA) 20 MG tablet Take 20 mg by mouth daily.   Yes Historical Provider, MD  lisinopril-hydrochlorothiazide (PRINZIDE,ZESTORETIC) 10-12.5 MG tablet Take 1 tablet by mouth daily.   Yes Historical Provider, MD  traZODone (DESYREL) 100 MG tablet Take 50-100 mg by mouth at bedtime as needed for sleep.   Yes Historical Provider, MD  amoxicillin (AMOXIL) 500 MG capsule Take 1 capsule (500 mg total) by mouth 3 (three) times daily. 02/17/16   Nat Christen, MD  cyclobenzaprine (FLEXERIL) 10 MG tablet Take 1 tablet (10 mg total) by mouth 3 (three) times daily as needed for muscle spasms. 03/31/16   Rolland Porter, MD  predniSONE (DELTASONE) 20 MG tablet Take 3 po QD x 3d , then 2 po QD x 3d then 1 po QD x 3d 03/31/16   Rolland Porter, MD    Family History No family history on file.  Social History Social History  Substance Use Topics  . Smoking status: Current Every Day Smoker    Packs/day: 0.50    Types: Cigarettes  . Smokeless tobacco: Never Used  . Alcohol use Yes     Comment: intermittent  States she sits with her mother   Allergies   Ibuprofen; Tramadol; and Erythromycin   Review of Systems Review of Systems  All other systems reviewed and are negative.    Physical Exam Updated Vital Signs BP (!) 119/103   Pulse 91   Temp 98.5 F (36.9 C) (Oral)   Resp 16   Ht 5\' 6"  (1.676 m)   Wt 186 lb (84.4 kg)   LMP 02/27/2016   SpO2 98%   BMI 30.02 kg/m   Vital signs normal    Physical Exam  Constitutional: She is oriented to person, place, and time. She appears well-developed  and well-nourished.  Non-toxic appearance. She does not appear ill. She appears distressed.  Patient is laying on her abdomen, appears uncomfortable when she changes positions  HENT:  Head: Normocephalic and atraumatic.  Right Ear: External ear normal.  Left Ear: External ear normal.  Nose: Nose normal. No mucosal edema or rhinorrhea.  Mouth/Throat: Oropharynx is clear and moist and mucous membranes are normal. No dental abscesses or uvula swelling.  Eyes: Conjunctivae and EOM are normal. Pupils are equal, round, and reactive to light.  Neck: Normal range of motion and full passive range of motion without pain. Neck supple.  Cardiovascular: Normal rate, regular rhythm and normal heart sounds.  Exam reveals no gallop and no friction rub.   No murmur heard. Pulmonary/Chest: Effort normal and breath sounds normal. No respiratory distress. She has no  wheezes. She has no rhonchi. She has no rales. She exhibits no tenderness and no crepitus.  Abdominal: Soft. Normal appearance and bowel sounds are normal. She exhibits no distension. There is no tenderness. There is no rebound and no guarding.  Musculoskeletal: Normal range of motion. She exhibits no edema or tenderness.       Back:  Moves all extremities well. Patient has diffuse lower back pain over the sacral area bilaterally. She has bilateral pain on straight leg raising. Her reflexes are 1+ and equal bilaterally. On range of motion of the way she has worsening pain to range of motion to the right, less so to the left.  Neurological: She is alert and oriented to person, place, and time. She has normal strength. No cranial nerve deficit.  Skin: Skin is warm, dry and intact. No rash noted. No erythema. No pallor.  Psychiatric: She has a normal mood and affect. Her speech is normal and behavior is normal. Her mood appears not anxious.  Nursing note and vitals reviewed.    ED Treatments / Results  Labs (all labs ordered are listed, but only abnormal results are displayed) Results for orders placed or performed during the hospital encounter of 03/31/16  POC urine preg, ED (not at [facility])  Result Value Ref Range   Preg Test, Ur NEGATIVE NEGATIVE    Procedures Procedures (including critical care time)  Medications Ordered in ED Medications  dexamethasone (DECADRON) injection 10 mg (10 mg Intramuscular Given 03/31/16 0238)  diazepam (VALIUM) injection 5 mg (5 mg Intramuscular Given 03/31/16 0238)     Initial Impression / Assessment and Plan / ED Course  I have reviewed the triage vital signs and the nursing notes.     Pertinent labs & imaging results that were available during my care of the patient were reviewed by me and considered in my medical decision making (see chart for details).  Clinical Course    Patient was given Decadron IM due to a ibuprofen allergy and Valium. She  will be advised to use ice and heat on her back. She was sent home on steroids and a muscle relaxer. She can follow-up with her PCP if she's not improving.  Patient has no worry some symptoms such as incontinence, or history of trauma.  Final Clinical Impressions(s) / ED Diagnoses   Final diagnoses:  Acute low back pain    New Prescriptions New Prescriptions   CYCLOBENZAPRINE (FLEXERIL) 10 MG TABLET    Take 1 tablet (10 mg total) by mouth 3 (three) times daily as needed for muscle spasms.   PREDNISONE (DELTASONE) 20 MG TABLET    Take 3 po QD x 3d , then  2 po QD x 3d then 1 po QD x 3d    Plan discharge  Rolland Porter, MD, Barbette Or, MD 03/31/16 [phone]

## 2016-03-31 NOTE — Discharge Instructions (Signed)
Use ice and heat on your back for comfort. Take the medications as prescribed, you can also add acetaminophen 1000 mg 4 times a day. Follow-up with your doctor if you aren't improving in the next week.

## 2016-03-31 NOTE — ED Triage Notes (Signed)
Pt c/o lower back pain that radiates down right leg that started today, pt also concerned that she may be pregnant due to nausea,

## 2016-04-11 ENCOUNTER — Emergency Department (HOSPITAL_COMMUNITY): Payer: Self-pay

## 2016-04-11 ENCOUNTER — Emergency Department (HOSPITAL_COMMUNITY)
Admission: EM | Admit: 2016-04-11 | Discharge: 2016-04-11 | Discharge status: Against Medical Advice | Payer: Self-pay | Attending: Emergency Medicine | Admitting: Emergency Medicine

## 2016-04-11 ENCOUNTER — Encounter (HOSPITAL_COMMUNITY): Payer: Self-pay | Admitting: *Deleted

## 2016-04-11 DIAGNOSIS — Y939 Activity, unspecified: Secondary | ICD-10-CM | POA: Insufficient documentation

## 2016-04-11 DIAGNOSIS — Y929 Unspecified place or not applicable: Secondary | ICD-10-CM | POA: Insufficient documentation

## 2016-04-11 DIAGNOSIS — J45909 Unspecified asthma, uncomplicated: Secondary | ICD-10-CM | POA: Insufficient documentation

## 2016-04-11 DIAGNOSIS — Z23 Encounter for immunization: Secondary | ICD-10-CM | POA: Insufficient documentation

## 2016-04-11 DIAGNOSIS — F17210 Nicotine dependence, cigarettes, uncomplicated: Secondary | ICD-10-CM | POA: Insufficient documentation

## 2016-04-11 DIAGNOSIS — S0990XA Unspecified injury of head, initial encounter: Secondary | ICD-10-CM

## 2016-04-11 DIAGNOSIS — Z792 Long term (current) use of antibiotics: Secondary | ICD-10-CM | POA: Insufficient documentation

## 2016-04-11 DIAGNOSIS — Y999 Unspecified external cause status: Secondary | ICD-10-CM | POA: Insufficient documentation

## 2016-04-11 DIAGNOSIS — I10 Essential (primary) hypertension: Secondary | ICD-10-CM | POA: Insufficient documentation

## 2016-04-11 DIAGNOSIS — S0101XA Laceration without foreign body of scalp, initial encounter: Secondary | ICD-10-CM | POA: Insufficient documentation

## 2016-04-11 DIAGNOSIS — Z79899 Other long term (current) drug therapy: Secondary | ICD-10-CM | POA: Insufficient documentation

## 2016-04-11 LAB — RAPID STREP SCREEN (MED CTR MEBANE ONLY): Streptococcus, Group A Screen (Direct): NEGATIVE

## 2016-04-11 MED ORDER — LIDOCAINE-EPINEPHRINE-TETRACAINE (LET) SOLUTION
3.0000 mL | Freq: Once | NASAL | Status: DC
  Filled 2016-04-11: qty 3

## 2016-04-11 MED ORDER — TETANUS-DIPHTH-ACELL PERTUSSIS 5-2.5-18.5 LF-MCG/0.5 IM SUSP
0.5000 mL | Freq: Once | INTRAMUSCULAR | Status: AC
  Administered 2016-04-11: 0.5 mL via INTRAMUSCULAR
  Filled 2016-04-11: qty 0.5
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
## 2016-04-11 NOTE — ED Notes (Signed)
Pt not in room. Pt not in any bathrooms on this until. Security did not see patient leave.

## 2016-04-11 NOTE — ED Notes (Signed)
MD at bedside. 
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
## 2016-04-13 LAB — CULTURE, GROUP A STREP (THRC)

## 2016-06-10 ENCOUNTER — Encounter (HOSPITAL_COMMUNITY): Payer: Self-pay | Admitting: Emergency Medicine

## 2016-06-10 ENCOUNTER — Emergency Department (HOSPITAL_COMMUNITY)
Admission: EM | Admit: 2016-06-10 | Discharge: 2016-06-10 | Disposition: A | Discharge status: Home / Self Care | Payer: Self-pay | Attending: Emergency Medicine | Admitting: Emergency Medicine

## 2016-06-10 DIAGNOSIS — Y929 Unspecified place or not applicable: Secondary | ICD-10-CM | POA: Insufficient documentation

## 2016-06-10 DIAGNOSIS — Z79899 Other long term (current) drug therapy: Secondary | ICD-10-CM | POA: Insufficient documentation

## 2016-06-10 DIAGNOSIS — F1290 Cannabis use, unspecified, uncomplicated: Secondary | ICD-10-CM | POA: Insufficient documentation

## 2016-06-10 DIAGNOSIS — S90862A Insect bite (nonvenomous), left foot, initial encounter: Secondary | ICD-10-CM | POA: Insufficient documentation

## 2016-06-10 DIAGNOSIS — R51 Headache: Secondary | ICD-10-CM | POA: Insufficient documentation

## 2016-06-10 DIAGNOSIS — I10 Essential (primary) hypertension: Secondary | ICD-10-CM | POA: Insufficient documentation

## 2016-06-10 DIAGNOSIS — W57XXXA Bitten or stung by nonvenomous insect and other nonvenomous arthropods, initial encounter: Secondary | ICD-10-CM | POA: Insufficient documentation

## 2016-06-10 DIAGNOSIS — F17210 Nicotine dependence, cigarettes, uncomplicated: Secondary | ICD-10-CM | POA: Insufficient documentation

## 2016-06-10 DIAGNOSIS — Y999 Unspecified external cause status: Secondary | ICD-10-CM | POA: Insufficient documentation

## 2016-06-10 DIAGNOSIS — Z791 Long term (current) use of non-steroidal anti-inflammatories (NSAID): Secondary | ICD-10-CM | POA: Insufficient documentation

## 2016-06-10 DIAGNOSIS — J45909 Unspecified asthma, uncomplicated: Secondary | ICD-10-CM | POA: Insufficient documentation

## 2016-06-10 DIAGNOSIS — M79672 Pain in left foot: Secondary | ICD-10-CM

## 2016-06-10 DIAGNOSIS — Y939 Activity, unspecified: Secondary | ICD-10-CM | POA: Insufficient documentation

## 2016-06-10 MED ORDER — SULFAMETHOXAZOLE-TRIMETHOPRIM 800-160 MG PO TABS: 1.0000 | ORAL_TABLET | Freq: Two times a day (BID) | ORAL | 0 refills | Status: AC

## 2016-06-10 MED ORDER — SULFAMETHOXAZOLE-TRIMETHOPRIM 800-160 MG PO TABS
1.0000 | ORAL_TABLET | Freq: Once | ORAL | Status: AC
  Administered 2016-06-10: 1 via ORAL
  Filled 2016-06-10: qty 1

## 2016-06-10 MED ORDER — CIPROFLOXACIN HCL 250 MG PO TABS
500.0000 mg | ORAL_TABLET | Freq: Once | ORAL | Status: AC
  Administered 2016-06-10: 500 mg via ORAL
  Filled 2016-06-10: qty 2

## 2016-06-10 MED ORDER — LISINOPRIL-HYDROCHLOROTHIAZIDE 10-12.5 MG PO TABS: 1.0000 | ORAL_TABLET | Freq: Every day | ORAL | 0 refills | Status: DC

## 2016-06-10 MED ORDER — HYDROCODONE-ACETAMINOPHEN 5-325 MG PO TABS: 1.0000 | ORAL_TABLET | ORAL | 0 refills | Status: DC | PRN

## 2016-06-10 MED ORDER — METOPROLOL TARTRATE 25 MG PO TABS
25.0000 mg | ORAL_TABLET | Freq: Once | ORAL | Status: AC
  Administered 2016-06-10: 25 mg via ORAL
  Filled 2016-06-10: qty 1

## 2016-06-10 MED ORDER — CIPROFLOXACIN HCL 500 MG PO TABS: 500.0000 mg | ORAL_TABLET | Freq: Two times a day (BID) | ORAL | 0 refills | Status: DC

## 2016-06-10 MED ORDER — HYDROCODONE-ACETAMINOPHEN 5-325 MG PO TABS
2.0000 | ORAL_TABLET | Freq: Once | ORAL | Status: AC
  Administered 2016-06-10: 2 via ORAL
  Filled 2016-06-10: qty 2

## 2016-06-10 MED ORDER — PROMETHAZINE HCL 12.5 MG PO TABS
12.5000 mg | ORAL_TABLET | Freq: Once | ORAL | Status: AC
  Administered 2016-06-10: 12.5 mg via ORAL
  Filled 2016-06-10: qty 1

## 2016-06-10 NOTE — ED Notes (Addendum)
Pt ambulatory to room. Pt states she noticed her foot was red and swollen 2 days ago. Pt reports itching and pain to the area. Same is red, swollen, and warm to the touch midway in her foot.

## 2016-06-10 NOTE — Discharge Instructions (Signed)
Please soak the left foot warm salt water for 15 minutes daily. Please use Bactrim and Cipro 2 times daily with food until all taken. Use Tylenol for mild pain, use Norco for more severe pain. Please elevate your foot above your waist is much as possible, this will improve your pain level. Please see your primary physician, or return to the emergency department if the wound area is not draining in 5 days, or there is a change in your general condition.

## 2016-06-10 NOTE — ED Provider Notes (Signed)
[facility] DEPT Provider Note   CSN: [id number] Arrival date & time: 06/10/16  1309     History   Chief Complaint Chief Complaint  Patient presents with  . Insect Bite    HPI Kristin Carson is a 45 y.o. female.  Patient states that she sustained a insect bite in between the second and third toe approximately 2 or 3 days ago. She states the area swollen up, and now she has pain with every step. She has noticed increased redness present and some swelling. The patient denies having a diagnosis of diabetes. She denies any known medical conditions that would affect her immune system. She's not had any operations or procedures involving the left foot. Patient denies any injury to the left foot, and she denies stepping on anything involving the left foot.      Past Medical History:  Diagnosis Date  . Asthma   . Bronchitis   . Headache   . Hypertension     Patient Active Problem List   Diagnosis Date Noted  . SOB (shortness of breath) 04/07/2013  . Migraine 04/07/2013    Past Surgical History:  Procedure Laterality Date  . BREAST SURGERY    . CESAREAN SECTION    . LEG SURGERY      OB History    Gravida Para Term Preterm AB Living   1 1 1          SAB TAB Ectopic Multiple Live Births                   Home Medications    Prior to Admission medications   Medication Sig Start Date End Date Taking? Authorizing Provider  acetaminophen (TYLENOL) 500 MG tablet Take 1,000 mg by mouth every 6 (six) hours as needed for mild pain or moderate pain.    Historical Provider, MD  citalopram (CELEXA) 20 MG tablet Take 20 mg by mouth daily.    Historical Provider, MD  cyclobenzaprine (FLEXERIL) 10 MG tablet Take 1 tablet (10 mg total) by mouth 3 (three) times daily as needed for muscle spasms. 03/31/16   Rolland Porter, MD  lisinopril-hydrochlorothiazide (PRINZIDE,ZESTORETIC) 10-12.5 MG tablet Take 1 tablet by mouth daily.    Historical Provider, MD  traZODone (DESYREL) 100 MG tablet  Take 50-100 mg by mouth at bedtime as needed for sleep.    Historical Provider, MD    Family History Family History  Problem Relation Age of Onset  . Dermatomyositis Mother   . Cancer Mother   . Hypertension Mother   . Cancer Father     Social History Social History  Substance Use Topics  . Smoking status: Current Every Day Smoker    Packs/day: 1.00    Types: Cigarettes  . Smokeless tobacco: Never Used  . Alcohol use Yes     Comment: intermittent     Allergies   Ibuprofen; Tramadol; and Erythromycin   Review of Systems Review of Systems  Constitutional: Negative for activity change.       All ROS Neg except as noted in HPI  HENT: Negative for nosebleeds.   Eyes: Negative for photophobia and discharge.  Respiratory: Negative for cough, shortness of breath and wheezing.   Cardiovascular: Negative for chest pain and palpitations.  Gastrointestinal: Negative for abdominal pain and blood in stool.  Genitourinary: Negative for dysuria, frequency and hematuria.  Musculoskeletal: Negative for arthralgias, back pain and neck pain.  Skin: Positive for wound.       Left foot wound  Neurological: Positive for headaches. Negative for dizziness, seizures and speech difficulty.  Psychiatric/Behavioral: Negative for confusion and hallucinations.     Physical Exam Updated Vital Signs BP 146/100 (BP Location: Left Arm)   Pulse 98   Temp 98.5 F (36.9 C) (Oral)   Resp 20   Ht 5\' 6"  (1.676 m)   Wt 81.6 kg   LMP 05/30/2016   SpO2 100%   BMI 29.05 kg/m   Physical Exam  Constitutional: She is oriented to person, place, and time. She appears well-developed and well-nourished.  Non-toxic appearance.  HENT:  Head: Normocephalic.  Right Ear: Tympanic membrane and external ear normal.  Left Ear: Tympanic membrane and external ear normal.  Eyes: EOM and lids are normal. Pupils are equal, round, and reactive to light.  Neck: Normal range of motion. Neck supple. Carotid bruit is  not present.  Cardiovascular: Normal rate, regular rhythm, normal heart sounds, intact distal pulses and normal pulses.   Pulmonary/Chest: Breath sounds normal. No respiratory distress.  Abdominal: Soft. Bowel sounds are normal. There is no tenderness. There is no guarding.  Musculoskeletal: Normal range of motion.  The web space between the second and third toes on the left foot is swollen. There is what appears to be a small pus pocket in the web space swollen area. There is pain to palpation. There is pain to movement of the toes. There is increased redness at the base of the toes extending into the dorsum of the left foot. However there is no red streaking appreciated. There is some increased warmth of the ankle to the foot. The area is not hot. There no puncture wounds noted to the plantar surface of the left foot. The dorsalis pedis pulses 2+.  Lymphadenopathy:       Head (right side): No submandibular adenopathy present.       Head (left side): No submandibular adenopathy present.    She has no cervical adenopathy.  Neurological: She is alert and oriented to person, place, and time. She has normal strength. No cranial nerve deficit or sensory deficit.  Skin: Skin is warm and dry.  Psychiatric: She has a normal mood and affect. Her speech is normal.  Nursing note and vitals reviewed.    ED Treatments / Results  Labs (all labs ordered are listed, but only abnormal results are displayed) Labs Reviewed - No data to display  EKG  EKG Interpretation None       Radiology No results found.  Procedures Procedures (including critical care time)  Medications Ordered in ED Medications  metoprolol tartrate (LOPRESSOR) tablet 25 mg (not administered)  HYDROcodone-acetaminophen (NORCO/VICODIN) 5-325 MG per tablet 2 tablet (not administered)  promethazine (PHENERGAN) tablet 12.5 mg (not administered)  ciprofloxacin (CIPRO) tablet 500 mg (not administered)    sulfamethoxazole-trimethoprim (BACTRIM DS,SEPTRA DS) 800-160 MG per tablet 1 tablet (not administered)     Initial Impression / Assessment and Plan / ED Course  I have reviewed the triage vital signs and the nursing notes.  Pertinent labs & imaging results that were available during my care of the patient were reviewed by me and considered in my medical decision making (see chart for details).  Clinical Course    *I have reviewed nursing notes, vital signs, and all appropriate lab and imaging results for this patient.**  Final Clinical Impressions(s) / ED Diagnoses  Patient sustained an insect bite to the left foot that appears to have gotten infected. The patient will be treated with Cipro and Bactrim. Prescription  for Norco is also given to the patient for pain. I've advised the patient to use warm Epsom salt soaks daily. She is to return for possible incision and drainage if this area continues to worsen and will not drain on its own. Patient is in agreement with this discharge plan.    Final diagnoses:  None    New Prescriptions New Prescriptions   No medications on file     Lily Kocher, PA-C 06/10/16 [place], MD 06/13/16 1343
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
## 2016-10-14 DIAGNOSIS — Z139 Encounter for screening, unspecified: Secondary | ICD-10-CM

## 2016-10-14 LAB — GLUCOSE, POCT (MANUAL RESULT ENTRY): POC GLUCOSE: 82 mg/dL (ref 70–99)

## 2016-10-14 NOTE — Congregational Nurse Program (Signed)
Congregational Nurse Program Note  Date of Encounter: 10/14/2016  Past Medical History: Past Medical History:  Diagnosis Date  . Asthma   . Bronchitis   . Headache   . Hypertension     Encounter Details:     CNP Questionnaire - 10/14/16 1456      Patient Demographics   Is this a new or existing patient? New   Patient is considered a/an Not Applicable   Race African-American/Black     Patient Assistance   Location of Patient Assistance MD Live - [facility]   Patient's financial/insurance status Low Income;Self-Pay (Uninsured)   Uninsured Patient ([place] [facility] Connects) Yes   Interventions Assisted patient in making appt.   Patient referred to apply for the following financial assistance Not Applicable   Food insecurities addressed Not Applicable   Transportation assistance No   Assistance securing medications Yes   Type of Assistance [employer] Navigating the healthcare system;Hypertension;Health literacy;Nutrition     Encounter Details   Primary purpose of visit Chronic Illness/Condition Visit;Navigating the Healthcare System   Was an Emergency Department visit averted? Yes   Does patient have a medical provider? No   Patient referred to [facility]   Was a mental health screening completed? (GAINS tool) No   Does patient have dental issues? Yes   Was a dental referral made? No   Does patient have vision issues? Yes   Was a vision referral made? No   Does your patient have an abnormal blood pressure today? Yes   Since previous encounter, have you referred patient for abnormal blood pressure that resulted in a new diagnosis or medication change? No   Does your patient have an abnormal blood glucose today? No   Since previous encounter, have you referred patient for abnormal blood glucose that resulted in a new diagnosis or medication change? No   Was there a life-saving intervention made? No     Patient came in because  she was out of her blood pressure medication.   Pts vitals: 176/111 83; 190/109 82 98.1/oral Glucose 82 (fasting)  Pt is unemployed and uninsured. Previously taking Lisinopril/HCTZ 10-12.5mg  tab but was out of it for about a month.   Pt allergic to Tramadol, Erythromycin, and Ibuprofen. Allergic reaction is hives for all 3.  Pt states she lives with her boyfriend who receive disability and pay for bills.  Pt medical history consist of hypertension, depression, anxiety, and some questionable COPD. Family history of cancer and hypertension.   Pt says she was last seen at [facility] years ago.   Pt complained of experiencing headaches for a week that are all day everyday, with a pain score from 1-10 a 7. Also experiencing dizziness, flashing lights, bad migraines, and not being able to see in the morning.  Denies hearing/ear issues, burning/tingling of hands and feet, numbness, neck injuries and sweating of hands/feet.  Pt states she has experienced chest pain, about a month ago but not any chest pain at this time.   Patient states she occasionally had episodes of SOB which causes panic attacks that she end up going to Er. She smokes cigarettes 1pack/day and marijuana 1-2 times a week and drinks 6 drinks of alcohol a day.  Pt complained of back hurting. Previous surgeries include C-section in '89, ankle surgery as a result had screws and pin on right ankle and a questionable surgery of breast.  Last menstrual cycle was 10/06/16.  Pt states she  was going to [facility]  For alcohol abuse but stopped going to tend to her mother who passed away in 2023-06-16. Believes she was seeing Dr. Jeanell Sparrow who prescribed her medication for depression and trouble sleeping but currently not taking anything.   Patients was told about dental Lucianne Lei and care connects for dental issues and referred to [facility] intern for risk assessment and evaluation for mental screening.   MD Live was done with Dr. Charisse Klinefelter who prescribed Lisinopril/HCTZ 10-12.5mg . Medication Voucher faxed to [facility]. [facility] at 10:15am. Pt advised to call 911 or go to ER if experiencing headaches, chest pain, blurry vision, SOB sign of stroke. Additional info given on hypertension, stress diet, and staying health.  Overall patient was pleasant and happy for the help she received.    Kristin Carson R. Kristin Pipkins LPN [id number]

## 2016-10-21 ENCOUNTER — Telehealth: Payer: Self-pay

## 2016-10-21 ENCOUNTER — Ambulatory Visit: Payer: Self-pay | Admitting: Physician Assistant

## 2016-10-21 NOTE — Telephone Encounter (Signed)
Called to follow up with client after [facility] 10[facility]/16.  No answer and voicemail left for client to please return call for follow up.

## 2016-10-24 ENCOUNTER — Telehealth: Payer: Self-pay

## 2016-10-24 NOTE — Telephone Encounter (Signed)
Client called RN needing [facility] transportation for [facility] on March 1st . [facility] called and transportation arranged.  RN also talked with client regarding her scheduled appointment for [facility]/16. Client states she had a family emergency and she was unable to make her appointment. RN encouraged client to call the [facility] on Monday 10/27/16 to reschedule appointment for primary care and for management of her high blood pressure. Client states understanding and states she will do so. RN will follow up with client next week to see if appointment was rescheduled. Client reports she is feeling better with less headaches since being on her medication that was prescribed of lisinopril/HCTZ.  Will follow as needed.

## 2016-10-29 ENCOUNTER — Telehealth: Payer: Self-pay

## 2016-10-29 NOTE — Telephone Encounter (Signed)
Called up to follow up with client regarding rescheduling of her [facility] appointment that she missed on 10/21/16 due to family emergency. Client states she has rescheduled her appointment and also has upcoming appointments with [facility]. Will follow as needed.

## 2016-11-05 ENCOUNTER — Encounter: Payer: Self-pay | Admitting: Physician Assistant

## 2016-11-05 ENCOUNTER — Telehealth: Payer: Self-pay

## 2016-11-05 ENCOUNTER — Ambulatory Visit: Payer: Self-pay | Admitting: Physician Assistant

## 2016-11-05 VITALS — BP 110/70 | HR 88 | Temp 97.9°F | Ht 64.0 in | Wt 178.5 lb

## 2016-11-05 DIAGNOSIS — Z131 Encounter for screening for diabetes mellitus: Secondary | ICD-10-CM

## 2016-11-05 DIAGNOSIS — E669 Obesity, unspecified: Secondary | ICD-10-CM

## 2016-11-05 DIAGNOSIS — I10 Essential (primary) hypertension: Secondary | ICD-10-CM

## 2016-11-05 DIAGNOSIS — Z6830 Body mass index (BMI) 30.0-30.9, adult: Secondary | ICD-10-CM

## 2016-11-05 DIAGNOSIS — F1910 Other psychoactive substance abuse, uncomplicated: Secondary | ICD-10-CM

## 2016-11-05 DIAGNOSIS — J441 Chronic obstructive pulmonary disease with (acute) exacerbation: Secondary | ICD-10-CM

## 2016-11-05 DIAGNOSIS — F1010 Alcohol abuse, uncomplicated: Secondary | ICD-10-CM

## 2016-11-05 DIAGNOSIS — Z13220 Encounter for screening for lipoid disorders: Secondary | ICD-10-CM

## 2016-11-05 DIAGNOSIS — R062 Wheezing: Secondary | ICD-10-CM

## 2016-11-05 DIAGNOSIS — F17219 Nicotine dependence, cigarettes, with unspecified nicotine-induced disorders: Secondary | ICD-10-CM

## 2016-11-05 MED ORDER — ALBUTEROL SULFATE (2.5 MG/3ML) 0.083% IN NEBU: 2.5000 mg | INHALATION_SOLUTION | Freq: Four times a day (QID) | RESPIRATORY_TRACT | 1 refills | Status: DC | PRN

## 2016-11-05 MED ORDER — ALBUTEROL SULFATE (2.5 MG/3ML) 0.083% IN NEBU
2.5000 mg | INHALATION_SOLUTION | Freq: Once | RESPIRATORY_TRACT | Status: AC
  Administered 2016-11-05: 2.5 mg via RESPIRATORY_TRACT

## 2016-11-05 MED ORDER — LISINOPRIL-HYDROCHLOROTHIAZIDE 10-12.5 MG PO TABS: 1.0000 | ORAL_TABLET | Freq: Every day | ORAL | 1 refills | Status: DC

## 2016-11-05 MED ORDER — ALBUTEROL SULFATE HFA 108 (90 BASE) MCG/ACT IN AERS: 2.0000 | INHALATION_SPRAY | Freq: Four times a day (QID) | RESPIRATORY_TRACT | 0 refills | Status: DC | PRN

## 2016-11-05 MED ORDER — METHYLPREDNISOLONE ACETATE 80 MG/ML IJ SUSP
80.0000 mg | Freq: Once | INTRAMUSCULAR | Status: AC
  Administered 2016-11-05: 80 mg via INTRAMUSCULAR

## 2016-11-05 NOTE — Progress Notes (Signed)
BP 110/70 (BP Location: Left Arm, Patient Position: Sitting, Cuff Size: Normal)   Pulse 88   Temp 97.9 F (36.6 C)   Ht 5\' 4"  (1.626 m)   Wt 178 lb 8 oz (81 kg)   SpO2 99%   BMI 30.64 kg/m    Subjective:    Patient ID: Kristin Carson, female    DOB: [birth year], 46 y.o.   MRN: [id number]  HPI: Kristin Carson is a 46 y.o. female presenting on 11/05/2016 for New Patient (Initial Visit) and Nasal Congestion (wheezing, HA, cough, Sore throat, SOB, difficulty breathing, runny nose, sneezing. started yesterday)   HPI   Pt states she has had a cold for several days and began with sob yesterday  Pt smokes 1 ppd  Pt is going to [facility] to help with alcohol  Pt never had mammogram  Last PAP- last year at [facility].   Relevant past medical, surgical, family and social history reviewed and updated as indicated. Interim medical history since our last visit reviewed. Allergies and medications reviewed and updated.   Current Outpatient Prescriptions:  .  lisinopril-hydrochlorothiazide (ZESTORETIC) 10-12.5 MG tablet, Take 1 tablet by mouth daily., Disp: 30 tablet, Rfl: 0   Review of Systems  Constitutional: Positive for chills. Negative for appetite change, diaphoresis, fatigue, fever and unexpected weight change.  HENT: Positive for dental problem, sneezing, sore throat, trouble swallowing and voice change. Negative for congestion, drooling, ear pain, facial swelling, hearing loss and mouth sores.   Eyes: Positive for discharge. Negative for pain, redness, itching and visual disturbance.  Respiratory: Positive for cough, choking, chest tightness, shortness of breath and wheezing.   Cardiovascular: Negative for chest pain, palpitations and leg swelling.  Gastrointestinal: Positive for vomiting (post-tussive emesis). Negative for abdominal pain, blood in stool, constipation and diarrhea.  Endocrine: Negative for cold intolerance, heat intolerance and polydipsia.  Genitourinary: Negative for  decreased urine volume, dysuria and hematuria.  Musculoskeletal: Negative for arthralgias, back pain and gait problem.  Skin: Negative for rash.  Allergic/Immunologic: Negative for environmental allergies.  Neurological: Positive for light-headedness and headaches. Negative for seizures and syncope.  Hematological: Negative for adenopathy.  Psychiatric/Behavioral: Negative for agitation, dysphoric mood and suicidal ideas. The patient is not nervous/anxious.     Per HPI unless specifically indicated above     Objective:    BP 110/70 (BP Location: Left Arm, Patient Position: Sitting, Cuff Size: Normal)   Pulse 88   Temp 97.9 F (36.6 C)   Ht 5\' 4"  (1.626 m)   Wt 178 lb 8 oz (81 kg)   SpO2 99%   BMI 30.64 kg/m   Wt Readings from Last 3 Encounters:  11/05/16 178 lb 8 oz (81 kg)  10/14/16 179 lb 12.8 oz (81.6 kg)  06/10/16 180 lb (81.6 kg)    Physical Exam  Constitutional: She is oriented to person, place, and time. She appears well-developed and well-nourished.  HENT:  Head: Normocephalic and atraumatic.  Mouth/Throat: Oropharynx is clear and moist. No oropharyngeal exudate.  Eyes: Conjunctivae and EOM are normal. Pupils are equal, round, and reactive to light.  Neck: Neck supple. No thyromegaly present.  Cardiovascular: Normal rate and regular rhythm.   Pulmonary/Chest: Effort normal. She has wheezes.  Abdominal: Soft. Bowel sounds are normal. She exhibits no mass. There is no hepatosplenomegaly. There is no tenderness.  Musculoskeletal: She exhibits no edema.  Lymphadenopathy:    She has no cervical adenopathy.  Neurological: She is alert and oriented to person, place,  and time. Gait normal.  Skin: Skin is warm and dry.  Psychiatric: She has a normal mood and affect. Her behavior is normal.  Vitals reviewed.   Results for orders placed or performed in visit on 10/14/16  POCT glucose  Result Value Ref Range   POC Glucose 82 70 - 99 mg/dl      Assessment & Plan:     Encounter Diagnoses  Name Primary?  . Acute exacerbation of chronic obstructive pulmonary disease (COPD) ([place]) Yes  . Essential hypertension   . Wheezing   . Cigarette nicotine dependence with nicotine-induced disorder   . Alcohol abuse   . Substance abuse   . Class 1 obesity with body mass index (BMI) of 30.0 to 30.9 in adult, unspecified obesity type, unspecified whether serious comorbidity present   . Screening cholesterol level   . Screening for diabetes mellitus     -get pt signed up for medassist -pt given Shot depomedrol in office today -rx albuterol mdi and neb -pt to get Baseline labs -pt to Continue lisinopril-hctz for bp -Counseled smoking cessation -pt to continue with [facility] -F/u 1 month.  RTO sooner prn

## 2016-11-05 NOTE — Telephone Encounter (Signed)
Called to follow up with client after [facility] with [facility] . Client states appointment went well and currently she is sick, coughing , wheezing, but they treated her. She reports she needs help with transportation with [facility] appointment for 10/1216 . [facility] called and transportation arranged. Client aware of pickup times. Will follow as needed.

## 2016-11-17 ENCOUNTER — Telehealth: Payer: Self-pay

## 2016-11-17 NOTE — Telephone Encounter (Signed)
Client called RN. She has prescriptions sent to [facility] by the [facility] and she states she is unable to afford them. Lisinopril/HCTZ , ventolin and ventolin for nebs. RN instructed to call [facility] to inquire about prices and if meds can be transferred. Client has received one time assistance previously with her blood pressure medication in Feb 2018. Client is to call RN back with further information. RN also counseled client to turn her paperwork into the [facility] for medication assistance program. She states she will do so this week. RN also assisted client with [facility] for an appointment with [facility] on 11/19/16.

## 2016-11-20 ENCOUNTER — Telehealth: Payer: Self-pay

## 2016-11-20 NOTE — Telephone Encounter (Signed)
Client called and needs help acquiring her medication lisinopril/HCTZ and albuterol solution for nebulizer. Client has prescriptions at [facility]. RN will call [facility] to get a cost and determine if any assistance can be given. RN will contact client back with information.

## 2016-11-20 NOTE — Telephone Encounter (Signed)
RN arranged a one time assistance with acquiring medication for client of Lisinopril/HCTZ and albuterol solution. Client aware that pharmacy will deliver medications to her home.

## 2016-11-25 ENCOUNTER — Other Ambulatory Visit: Payer: Self-pay | Admitting: Physician Assistant

## 2016-11-25 DIAGNOSIS — I10 Essential (primary) hypertension: Secondary | ICD-10-CM

## 2016-11-25 DIAGNOSIS — F1010 Alcohol abuse, uncomplicated: Secondary | ICD-10-CM

## 2016-11-25 LAB — COMPREHENSIVE METABOLIC PANEL
ALBUMIN: 3.7 g/dL (ref 3.6–5.1)
ALK PHOS: 44 U/L (ref 33–115)
ALT: 8 U/L (ref 6–29)
AST: 13 U/L (ref 10–30)
BILIRUBIN TOTAL: 0.2 mg/dL (ref 0.2–1.2)
BUN: 26 mg/dL — ABNORMAL HIGH (ref 7–25)
CO2: 25 mmol/L (ref 20–31)
CREATININE: 1.25 mg/dL — AB (ref 0.50–1.10)
Calcium: 9.2 mg/dL (ref 8.6–10.2)
Chloride: 104 mmol/L (ref 98–110)
Glucose, Bld: 83 mg/dL (ref 65–99)
Potassium: 5 mmol/L (ref 3.5–5.3)
SODIUM: 133 mmol/L — AB (ref 135–146)
TOTAL PROTEIN: 7.1 g/dL (ref 6.1–8.1)

## 2016-11-25 LAB — LIPID PANEL
CHOL/HDL RATIO: 2.8 ratio (ref <5.0)
Cholesterol: 155 mg/dL (ref <200)
HDL: 55 mg/dL (ref >50)
LDL Cholesterol: 87 mg/dL (ref <100)
Triglycerides: 63 mg/dL (ref <150)
VLDL: 13 mg/dL (ref <30)

## 2016-11-26 ENCOUNTER — Other Ambulatory Visit: Payer: Self-pay | Admitting: Physician Assistant

## 2016-11-26 LAB — AMMONIA: Ammonia: 48 umol/L — ABNORMAL HIGH (ref <=47)

## 2016-11-26 LAB — HEMOGLOBIN A1C
HEMOGLOBIN A1C: 5 % (ref <5.7)
MEAN PLASMA GLUCOSE: 97 mg/dL

## 2016-11-26 MED ORDER — ALBUTEROL SULFATE HFA 108 (90 BASE) MCG/ACT IN AERS: 2.0000 | INHALATION_SPRAY | Freq: Four times a day (QID) | RESPIRATORY_TRACT | 0 refills | Status: DC | PRN

## 2016-11-26 MED ORDER — LISINOPRIL-HYDROCHLOROTHIAZIDE 10-12.5 MG PO TABS: 1.0000 | ORAL_TABLET | Freq: Every day | ORAL | 0 refills | Status: DC

## 2016-11-26 MED ORDER — ALBUTEROL SULFATE (2.5 MG/3ML) 0.083% IN NEBU: 2.5000 mg | INHALATION_SOLUTION | Freq: Four times a day (QID) | RESPIRATORY_TRACT | 0 refills | Status: DC | PRN

## 2016-12-10 ENCOUNTER — Encounter: Payer: Self-pay | Admitting: Physician Assistant

## 2016-12-10 ENCOUNTER — Ambulatory Visit: Payer: Self-pay | Admitting: Physician Assistant

## 2016-12-10 VITALS — BP 116/72 | HR 90 | Temp 97.7°F | Ht 64.0 in | Wt 180.0 lb

## 2016-12-10 DIAGNOSIS — Z6830 Body mass index (BMI) 30.0-30.9, adult: Secondary | ICD-10-CM

## 2016-12-10 DIAGNOSIS — Z8742 Personal history of other diseases of the female genital tract: Secondary | ICD-10-CM

## 2016-12-10 DIAGNOSIS — N189 Chronic kidney disease, unspecified: Secondary | ICD-10-CM | POA: Insufficient documentation

## 2016-12-10 DIAGNOSIS — F17219 Nicotine dependence, cigarettes, with unspecified nicotine-induced disorders: Secondary | ICD-10-CM | POA: Insufficient documentation

## 2016-12-10 DIAGNOSIS — I10 Essential (primary) hypertension: Secondary | ICD-10-CM | POA: Insufficient documentation

## 2016-12-10 DIAGNOSIS — E669 Obesity, unspecified: Secondary | ICD-10-CM

## 2016-12-10 DIAGNOSIS — J449 Chronic obstructive pulmonary disease, unspecified: Secondary | ICD-10-CM | POA: Insufficient documentation

## 2016-12-10 DIAGNOSIS — J441 Chronic obstructive pulmonary disease with (acute) exacerbation: Secondary | ICD-10-CM | POA: Insufficient documentation

## 2016-12-10 NOTE — Progress Notes (Signed)
BP 116/72 (BP Location: Left Arm, Patient Position: Sitting, Cuff Size: Normal)   Pulse 90   Temp 97.7 F (36.5 C) (Other (Comment))   Ht 5\' 4"  (1.626 m)   Wt 180 lb (81.6 kg)   LMP 12/08/2016 (Exact Date)   SpO2 99%   BMI 30.90 kg/m    Subjective:    Patient ID: Kristin Carson, female    DOB: 1971-03-28, 46 y.o.   MRN: [id number]  HPI: Kristin Carson is a 46 y.o. female presenting on 12/10/2016 for COPD and Hypertension   HPI   Pt still going to [facility]  She got call from [facility] and was told she should get her meds in about 10 days.   She has enough to last until then  Her breathing is a bit better.  She is still smoking.   LMP now  Got record of last PAP from [facility].  Was done 11/29/2008 and reported as LSIL.  Pt states she never got the colpo done that was recommended.    Relevant past medical, surgical, family and social history reviewed and updated as indicated. Interim medical history since our last visit reviewed. Allergies and medications reviewed and updated.   Current Outpatient Prescriptions:  .  albuterol (PROVENTIL) (2.5 MG/3ML) 0.083% nebulizer solution, Take 3 mLs (2.5 mg total) by nebulization every 6 (six) hours as needed for wheezing or shortness of breath., Disp: 150 mL, Rfl: 0 .  lisinopril-hydrochlorothiazide (ZESTORETIC) 10-12.5 MG tablet, Take 1 tablet by mouth daily., Disp: 90 tablet, Rfl: 0 .  albuterol (PROVENTIL HFA;VENTOLIN HFA) 108 (90 Base) MCG/ACT inhaler, Inhale 2 puffs into the lungs every 6 (six) hours as needed for wheezing or shortness of breath. (Patient not taking: Reported on 12/10/2016), Disp: 3 Inhaler, Rfl: 0   Review of Systems  Constitutional: Negative for appetite change, chills, diaphoresis, fatigue, fever and unexpected weight change.  HENT: Positive for dental problem. Negative for congestion, drooling, ear pain, facial swelling, hearing loss, mouth sores, sneezing, sore throat, trouble swallowing and voice change.   Eyes:  Negative for pain, discharge, redness, itching and visual disturbance.  Respiratory: Positive for cough, shortness of breath and wheezing. Negative for choking.   Cardiovascular: Negative for chest pain, palpitations and leg swelling.  Gastrointestinal: Negative for abdominal pain, blood in stool, constipation, diarrhea and vomiting.  Endocrine: Negative for cold intolerance, heat intolerance and polydipsia.  Genitourinary: Negative for decreased urine volume, dysuria and hematuria.  Musculoskeletal: Negative for arthralgias, back pain and gait problem.  Skin: Negative for rash.  Allergic/Immunologic: Negative for environmental allergies.  Neurological: Negative for seizures, syncope, light-headedness and headaches.  Hematological: Negative for adenopathy.  Psychiatric/Behavioral: Negative for agitation, dysphoric mood and suicidal ideas. The patient is not nervous/anxious.     Per HPI unless specifically indicated above     Objective:    BP 116/72 (BP Location: Left Arm, Patient Position: Sitting, Cuff Size: Normal)   Pulse 90   Temp 97.7 F (36.5 C) (Other (Comment))   Ht 5\' 4"  (1.626 m)   Wt 180 lb (81.6 kg)   LMP 12/08/2016 (Exact Date)   SpO2 99%   BMI 30.90 kg/m   Wt Readings from Last 3 Encounters:  12/10/16 180 lb (81.6 kg)  11/05/16 178 lb 8 oz (81 kg)  10/14/16 179 lb 12.8 oz (81.6 kg)    Physical Exam  Constitutional: She is oriented to person, place, and time. She appears well-developed and well-nourished.  HENT:  Head: Normocephalic and atraumatic.  Neck: Neck supple.  Cardiovascular: Normal rate and regular rhythm.   Pulmonary/Chest: Effort normal and breath sounds normal.  Abdominal: Soft. Bowel sounds are normal. She exhibits no mass. There is no hepatosplenomegaly. There is no tenderness.  Musculoskeletal: She exhibits no edema.  Lymphadenopathy:    She has no cervical adenopathy.  Neurological: She is alert and oriented to person, place, and time.   Skin: Skin is warm and dry.  Psychiatric: She has a normal mood and affect. Her behavior is normal.  Vitals reviewed.       Assessment & Plan:    Encounter Diagnoses  Name Primary?  . Essential hypertension Yes  . Chronic obstructive pulmonary disease, unspecified COPD type ([place])   . Cigarette nicotine dependence with nicotine-induced disorder   . Chronic kidney disease, unspecified CKD stage   . Class 1 obesity with body mass index (BMI) of 30.0 to 30.9 in adult, unspecified obesity type, unspecified whether serious comorbidity present   . History of abnormal cervical Pap smear      -Reviewed labs with pt -Will monitor Cr -counseled Smoking cessation -rto next week for pap

## 2016-12-16 ENCOUNTER — Encounter: Payer: Self-pay | Admitting: Physician Assistant

## 2016-12-16 ENCOUNTER — Telehealth: Payer: Self-pay

## 2016-12-16 ENCOUNTER — Ambulatory Visit: Payer: Self-pay | Admitting: Physician Assistant

## 2016-12-16 ENCOUNTER — Other Ambulatory Visit: Payer: Self-pay | Admitting: Physician Assistant

## 2016-12-16 VITALS — BP 112/74 | HR 97 | Temp 97.3°F | Ht 64.0 in | Wt 180.5 lb

## 2016-12-16 DIAGNOSIS — Z124 Encounter for screening for malignant neoplasm of cervix: Secondary | ICD-10-CM

## 2016-12-16 DIAGNOSIS — N189 Chronic kidney disease, unspecified: Secondary | ICD-10-CM

## 2016-12-16 DIAGNOSIS — I10 Essential (primary) hypertension: Secondary | ICD-10-CM

## 2016-12-16 NOTE — Telephone Encounter (Signed)
Client called requesting help with [facility] for an appointment with [facility] on 12/24/16 . Will call client back to confirm once arrangements are completed.

## 2016-12-16 NOTE — Telephone Encounter (Signed)
[facility] arranged for 12/24/16 at 1:00 PM for an appointment with [facility]. Client aware that pickup time will be 1230 that day.

## 2016-12-16 NOTE — Progress Notes (Signed)
BP 112/74 (BP Location: Left Arm, Patient Position: Sitting, Cuff Size: Normal)   Pulse 97   Temp 97.3 F (36.3 C)   Ht 5\' 4"  (1.626 m)   Wt 180 lb 8 oz (81.9 kg)   LMP 12/08/2016 (Exact Date)   SpO2 99%   BMI 30.98 kg/m    Subjective:    Patient ID: Kristin Carson, female    DOB: 1971-01-14, 46 y.o.   MRN: [id number]  HPI: Kristin Carson is a 46 y.o. female presenting on 12/16/2016 for Gynecologic Exam   HPI  Relevant past medical, surgical, family and social history reviewed and updated as indicated. Interim medical history since our last visit reviewed. Allergies and medications reviewed and updated.   Current Outpatient Prescriptions:  .  albuterol (PROVENTIL HFA;VENTOLIN HFA) 108 (90 Base) MCG/ACT inhaler, Inhale 2 puffs into the lungs every 6 (six) hours as needed for wheezing or shortness of breath., Disp: 3 Inhaler, Rfl: 0 .  albuterol (PROVENTIL) (2.5 MG/3ML) 0.083% nebulizer solution, Take 3 mLs (2.5 mg total) by nebulization every 6 (six) hours as needed for wheezing or shortness of breath., Disp: 150 mL, Rfl: 0 .  lisinopril-hydrochlorothiazide (ZESTORETIC) 10-12.5 MG tablet, Take 1 tablet by mouth daily., Disp: 90 tablet, Rfl: 0  Review of Systems  Constitutional: Negative for appetite change, chills, diaphoresis, fatigue, fever and unexpected weight change.  HENT: Negative for congestion, dental problem, drooling, ear pain, facial swelling, hearing loss, mouth sores, sneezing, sore throat, trouble swallowing and voice change.   Eyes: Negative for pain, discharge, redness, itching and visual disturbance.  Respiratory: Positive for cough and shortness of breath. Negative for choking and wheezing.   Cardiovascular: Negative for chest pain, palpitations and leg swelling.  Gastrointestinal: Negative for abdominal pain, blood in stool, constipation, diarrhea and vomiting.  Endocrine: Negative for cold intolerance, heat intolerance and polydipsia.  Genitourinary: Negative for  decreased urine volume, dysuria and hematuria.  Musculoskeletal: Positive for back pain. Negative for arthralgias and gait problem.  Skin: Negative for rash.  Allergic/Immunologic: Negative for environmental allergies.  Neurological: Positive for headaches. Negative for seizures, syncope and light-headedness.  Hematological: Negative for adenopathy.  Psychiatric/Behavioral: Negative for agitation, dysphoric mood and suicidal ideas. The patient is not nervous/anxious.     Per HPI unless specifically indicated above     Objective:    BP 112/74 (BP Location: Left Arm, Patient Position: Sitting, Cuff Size: Normal)   Pulse 97   Temp 97.3 F (36.3 C)   Ht 5\' 4"  (1.626 m)   Wt 180 lb 8 oz (81.9 kg)   LMP 12/08/2016 (Exact Date)   SpO2 99%   BMI 30.98 kg/m   Wt Readings from Last 3 Encounters:  12/16/16 180 lb 8 oz (81.9 kg)  12/10/16 180 lb (81.6 kg)  11/05/16 178 lb 8 oz (81 kg)    Physical Exam  Constitutional: She is oriented to person, place, and time. She appears well-developed and well-nourished.  Pulmonary/Chest: Effort normal.  Breast exam normal  Abdominal: Soft. She exhibits no mass. There is no tenderness. There is no rebound and no guarding.  Genitourinary: Vagina normal and uterus normal. No breast swelling, tenderness, discharge or bleeding. There is no rash, tenderness or lesion on the right labia. There is no rash, tenderness or lesion on the left labia. Cervix exhibits no motion tenderness, no discharge and no friability. Right adnexum displays no mass, no tenderness and no fullness. Left adnexum displays no mass, no tenderness and no fullness.  Genitourinary Comments: (nurse Berenice assisted)  Neurological: She is alert and oriented to person, place, and time.  Skin: Skin is warm and dry.  Psychiatric: She has a normal mood and affect. Her behavior is normal.  Nursing note and vitals reviewed.       Assessment & Plan:     Encounter Diagnoses  Name Primary?   . Routine Papanicolaou smear Yes  . Essential hypertension   . Chronic kidney disease, unspecified CKD stage     -pt to follow up in 3 months.  RTO sooner prn

## 2016-12-17 LAB — PAP, THINPREP RFLX HPV

## 2016-12-23 ENCOUNTER — Other Ambulatory Visit: Payer: Self-pay | Admitting: Physician Assistant

## 2016-12-23 MED ORDER — LISINOPRIL-HYDROCHLOROTHIAZIDE 10-12.5 MG PO TABS: 1.0000 | ORAL_TABLET | Freq: Every day | ORAL | 0 refills | Status: DC

## 2016-12-30 ENCOUNTER — Other Ambulatory Visit: Payer: Self-pay | Admitting: Physician Assistant

## 2016-12-30 MED ORDER — METRONIDAZOLE 500 MG PO TABS: 2000.0000 mg | ORAL_TABLET | Freq: Once | ORAL | 0 refills | Status: AC

## 2017-02-10 ENCOUNTER — Other Ambulatory Visit: Payer: Self-pay | Admitting: Physician Assistant

## 2017-02-10 DIAGNOSIS — N189 Chronic kidney disease, unspecified: Secondary | ICD-10-CM

## 2017-02-10 DIAGNOSIS — I10 Essential (primary) hypertension: Secondary | ICD-10-CM

## 2017-02-16 ENCOUNTER — Encounter: Payer: Self-pay | Admitting: Physician Assistant

## 2017-03-17 ENCOUNTER — Ambulatory Visit: Payer: Self-pay | Admitting: Physician Assistant

## 2017-03-18 ENCOUNTER — Encounter (HOSPITAL_COMMUNITY): Payer: Self-pay | Admitting: Cardiology

## 2017-03-18 ENCOUNTER — Emergency Department (HOSPITAL_COMMUNITY)
Admission: EM | Admit: 2017-03-18 | Discharge: 2017-03-18 | Disposition: A | Discharge status: Home / Self Care | Payer: Self-pay | Attending: Emergency Medicine | Admitting: Emergency Medicine

## 2017-03-18 DIAGNOSIS — Z5321 Procedure and treatment not carried out due to patient leaving prior to being seen by health care provider: Secondary | ICD-10-CM | POA: Insufficient documentation

## 2017-03-18 DIAGNOSIS — R1030 Lower abdominal pain, unspecified: Secondary | ICD-10-CM | POA: Insufficient documentation

## 2017-03-18 NOTE — ED Notes (Signed)
Called pt no answer in waiting room

## 2017-03-18 NOTE — ED Triage Notes (Signed)
Lower abdominal pain since yesterday.

## 2017-03-18 NOTE — ED Triage Notes (Signed)
Called no answer

## 2017-03-26 ENCOUNTER — Other Ambulatory Visit: Payer: Self-pay | Admitting: Student

## 2017-03-31 ENCOUNTER — Encounter: Payer: Self-pay | Admitting: Physician Assistant

## 2017-04-21 ENCOUNTER — Encounter: Payer: Self-pay | Admitting: Physician Assistant

## 2017-04-21 ENCOUNTER — Ambulatory Visit: Payer: Self-pay | Admitting: Physician Assistant

## 2017-04-21 VITALS — BP 130/86 | HR 83 | Temp 97.9°F | Ht 64.0 in | Wt 179.8 lb

## 2017-04-21 DIAGNOSIS — Z6830 Body mass index (BMI) 30.0-30.9, adult: Secondary | ICD-10-CM

## 2017-04-21 DIAGNOSIS — F17219 Nicotine dependence, cigarettes, with unspecified nicotine-induced disorders: Secondary | ICD-10-CM

## 2017-04-21 DIAGNOSIS — E669 Obesity, unspecified: Secondary | ICD-10-CM

## 2017-04-21 DIAGNOSIS — N189 Chronic kidney disease, unspecified: Secondary | ICD-10-CM

## 2017-04-21 DIAGNOSIS — I10 Essential (primary) hypertension: Secondary | ICD-10-CM

## 2017-04-21 DIAGNOSIS — J449 Chronic obstructive pulmonary disease, unspecified: Secondary | ICD-10-CM

## 2017-04-21 NOTE — Patient Instructions (Signed)
Health Risks of Smoking  Smoking cigarettes is very bad for your health. Tobacco smoke has over 200 known poisons in it. It contains the poisonous gases nitrogen oxide and carbon monoxide. There are over 60 chemicals in tobacco smoke that cause cancer.  Smoking is difficult to quit because a chemical in tobacco, called nicotine, causes addiction or dependence. When you smoke and inhale, nicotine is absorbed rapidly into the bloodstream through your lungs. Both inhaled and non-inhaled nicotine may be addictive.  What are the risks of cigarette smoke?  Cigarette smokers have an increased risk of many serious medical problems, including:  · Lung cancer.  · Lung disease, such as pneumonia, bronchitis, and emphysema.  · Chest pain (angina) and heart attack because the heart is not getting enough oxygen.  · Heart disease and peripheral blood vessel disease.  · High blood pressure (hypertension).  · Stroke.  · Oral cancer, including cancer of the lip, mouth, or voice box.  · Bladder cancer.  · Pancreatic cancer.  · Cervical cancer.  · Pregnancy complications, including premature birth.  · Stillbirths and smaller newborn babies, birth defects, and genetic damage to sperm.  · Early menopause.  · Lower estrogen level for women.  · Infertility.  · Facial wrinkles.  · Blindness.  · Increased risk of broken bones (fractures).  · Senile dementia.  · Stomach ulcers and internal bleeding.  · Delayed wound healing and increased risk of complications during surgery.  · Even smoking lightly shortens your life expectancy by several years.    Because of secondhand smoke exposure, children of smokers have an increased risk of the following:  · Sudden infant death syndrome (SIDS).  · Respiratory infections.  · Lung cancer.  · Heart disease.  · Ear infections.    What are the benefits of quitting?  There are many health benefits of quitting smoking. Here are some of them:   · Within days of quitting smoking, your risk of having a heart attack decreases, your blood flow improves, and your lung capacity improves. Blood pressure, pulse rate, and breathing patterns start returning to normal soon after quitting.  · Within months, your lungs may clear up completely.  · Quitting for 10 years reduces your risk of developing lung cancer and heart disease to almost that of a nonsmoker.  · People who quit may see an improvement in their overall quality of life.    How do I quit smoking?  Smoking is an addiction with both physical and psychological effects, and longtime habits can be hard to change. Your health care provider can recommend:  · Programs and community resources, which may include group support, education, or talk therapy.  · Prescription medicines to help reduce cravings.  · Nicotine replacement products, such as patches, gum, and nasal sprays. Use these products only as directed. Do not replace cigarette smoking with electronic cigarettes, which are commonly called e-cigarettes. The safety of e-cigarettes is not known, and some may contain harmful chemicals.  · A combination of two or more of these methods.    Where to find more information:  · American Lung Association: www.lung.org  · American Cancer Society: www.cancer.org  Summary  · Smoking cigarettes is very bad for your health. Cigarette smokers have an increased risk of many serious medical problems, including several cancers, heart disease, and stroke.  · Smoking is an addiction with both physical and psychological effects, and longtime habits can be hard to change.  · By stopping right away, you   can greatly reduce the risk of medical problems for you and your family.  · To help you quit smoking, your health care provider can recommend programs, community resources, prescription medicines, and nicotine replacement products such as patches, gum, and nasal sprays.   This information is not intended to replace advice given to you by your health care provider. Make sure you discuss any questions you have with your health care provider.  Document Released: 09/25/2004 Document Revised: 08/22/2016 Document Reviewed: 08/22/2016  Elsevier Interactive Patient Education © 2017 Elsevier Inc.

## 2017-04-21 NOTE — Progress Notes (Signed)
BP 130/86 (BP Location: Left Arm, Patient Position: Sitting, Cuff Size: Normal)   Pulse 83   Temp 97.9 F (36.6 C)   Ht 5\' 4"  (1.626 m)   Wt 179 lb 12 oz (81.5 kg)   SpO2 99%   BMI 30.85 kg/m    Subjective:    Patient ID: Kristin Carson, female    DOB: [birth year], 46 y.o.   MRN: [id number]  HPI: Kristin Carson is a 46 y.o. female presenting on 04/21/2017 for Follow-up   HPI  Pt still going to [facility]  She is sStill smoking  Pt says she is doing well  Relevant past medical, surgical, family and social history reviewed and updated as indicated. Interim medical history since our last visit reviewed. Allergies and medications reviewed and updated.   Current Outpatient Prescriptions:  .  albuterol (PROVENTIL HFA;VENTOLIN HFA) 108 (90 Base) MCG/ACT inhaler, Inhale 2 puffs into the lungs every 6 (six) hours as needed for wheezing or shortness of breath., Disp: 3 Inhaler, Rfl: 0 .  albuterol (PROVENTIL) (2.5 MG/3ML) 0.083% nebulizer solution, Take 3 mLs (2.5 mg total) by nebulization every 6 (six) hours as needed for wheezing or shortness of breath., Disp: 150 mL, Rfl: 0 .  lisinopril-hydrochlorothiazide (ZESTORETIC) 10-12.5 MG tablet, Take 1 tablet by mouth daily., Disp: 30 tablet, Rfl: 0   Review of Systems  Constitutional: Negative for appetite change, chills, diaphoresis, fatigue, fever and unexpected weight change.  HENT: Positive for dental problem. Negative for congestion, drooling, ear pain, facial swelling, hearing loss, mouth sores, sneezing, sore throat, trouble swallowing and voice change.   Eyes: Negative for pain, discharge, redness, itching and visual disturbance.  Respiratory: Positive for cough, shortness of breath and wheezing. Negative for choking.   Cardiovascular: Negative for chest pain, palpitations and leg swelling.  Gastrointestinal: Negative for abdominal pain, blood in stool, constipation, diarrhea and vomiting.  Endocrine: Negative for cold intolerance,  heat intolerance and polydipsia.  Genitourinary: Negative for decreased urine volume, dysuria and hematuria.  Musculoskeletal: Negative for arthralgias, back pain and gait problem.  Skin: Negative for rash.  Allergic/Immunologic: Negative for environmental allergies.  Neurological: Negative for seizures, syncope, light-headedness and headaches.  Hematological: Negative for adenopathy.  Psychiatric/Behavioral: Negative for agitation, dysphoric mood and suicidal ideas. The patient is not nervous/anxious.     Per HPI unless specifically indicated above     Objective:    BP 130/86 (BP Location: Left Arm, Patient Position: Sitting, Cuff Size: Normal)   Pulse 83   Temp 97.9 F (36.6 C)   Ht 5\' 4"  (1.626 m)   Wt 179 lb 12 oz (81.5 kg)   SpO2 99%   BMI 30.85 kg/m   Wt Readings from Last 3 Encounters:  04/21/17 179 lb 12 oz (81.5 kg)  03/18/17 180 lb (81.6 kg)  12/16/16 180 lb 8 oz (81.9 kg)    Physical Exam  Constitutional: She is oriented to person, place, and time. She appears well-developed and well-nourished.  HENT:  Head: Normocephalic and atraumatic.  Neck: Neck supple.  Cardiovascular: Normal rate and regular rhythm.   Pulmonary/Chest: Effort normal and breath sounds normal.  Abdominal: Soft. Bowel sounds are normal. She exhibits no mass. There is no hepatosplenomegaly. There is no tenderness.  Musculoskeletal: She exhibits no edema.  Lymphadenopathy:    She has no cervical adenopathy.  Neurological: She is alert and oriented to person, place, and time.  Skin: Skin is warm and dry.  Psychiatric: She has a normal mood and affect.  Her behavior is normal.  Vitals reviewed.       Assessment & Plan:   Encounter Diagnoses  Name Primary?  . Essential hypertension Yes  . Chronic obstructive pulmonary disease, unspecified COPD type ([place])   . Chronic kidney disease, unspecified CKD stage   . Cigarette nicotine dependence with nicotine-induced disorder   . Class 1 obesity  with body mass index (BMI) of 30.0 to 30.9 in adult, unspecified obesity type, unspecified whether serious comorbidity present      -pt to Get blood dtawn today- bmp.  Will call with results -pt to continue current medications -cousneled smoking cessation -pt to follow up in 3 months. RTO sooner prn

## 2017-04-28 ENCOUNTER — Other Ambulatory Visit: Payer: Self-pay | Admitting: Physician Assistant

## 2017-04-29 ENCOUNTER — Other Ambulatory Visit: Payer: Self-pay | Admitting: Physician Assistant

## 2017-04-29 ENCOUNTER — Telehealth: Payer: Self-pay | Admitting: Student

## 2017-04-29 MED ORDER — ALBUTEROL SULFATE HFA 108 (90 BASE) MCG/ACT IN AERS: 2.0000 | INHALATION_SPRAY | Freq: Four times a day (QID) | RESPIRATORY_TRACT | 0 refills | Status: DC | PRN

## 2017-04-29 MED ORDER — LISINOPRIL-HYDROCHLOROTHIAZIDE 10-12.5 MG PO TABS: 1.0000 | ORAL_TABLET | Freq: Every day | ORAL | 0 refills | Status: DC

## 2017-04-29 MED ORDER — ALBUTEROL SULFATE (2.5 MG/3ML) 0.083% IN NEBU: 2.5000 mg | INHALATION_SOLUTION | Freq: Four times a day (QID) | RESPIRATORY_TRACT | 0 refills | Status: DC | PRN

## 2017-04-29 NOTE — Telephone Encounter (Signed)
-----   Message from Soyla Dryer, [place] sent at 04/28/2017  7:13 PM EDT ----- Please let pt know that she needs to get her labs drawn.  She was initially requested to have them drawn in July.  She was reminded at her OV 04/21/17.  She has still not done them.  She does not need to be fasting. thanks

## 2017-04-29 NOTE — Telephone Encounter (Signed)
Called and notified pt that she needs to get labs drawn as she was initially asked to go in July and was also reminded at her 04-21-17 OV. Pt stated understanding.

## 2017-04-30 ENCOUNTER — Other Ambulatory Visit (HOSPITAL_COMMUNITY)
Admission: RE | Admit: 2017-04-30 | Discharge: 2017-04-30 | Disposition: A | Discharge status: Home / Self Care | Payer: Self-pay | Source: Ambulatory Visit | Attending: Physician Assistant | Admitting: Physician Assistant

## 2017-04-30 DIAGNOSIS — N189 Chronic kidney disease, unspecified: Secondary | ICD-10-CM

## 2017-04-30 DIAGNOSIS — I10 Essential (primary) hypertension: Secondary | ICD-10-CM

## 2017-04-30 LAB — BASIC METABOLIC PANEL
Anion gap: 8 (ref 5–15)
BUN: 17 mg/dL (ref 6–20)
CALCIUM: 9 mg/dL (ref 8.9–10.3)
CHLORIDE: 101 mmol/L (ref 101–111)
CO2: 24 mmol/L (ref 22–32)
Creatinine, Ser: 0.98 mg/dL (ref 0.44–1.00)
GFR calc non Af Amer: >60 mL/min (ref >60)
Glucose, Bld: 101 mg/dL — ABNORMAL HIGH (ref 65–99)
Potassium: 4.3 mmol/L (ref 3.5–5.1)
Sodium: 133 mmol/L — ABNORMAL LOW (ref 135–145)

## 2017-06-08 ENCOUNTER — Other Ambulatory Visit: Payer: Self-pay | Admitting: Physician Assistant

## 2017-06-15 ENCOUNTER — Encounter (HOSPITAL_COMMUNITY): Payer: Self-pay | Admitting: *Deleted

## 2017-06-15 ENCOUNTER — Emergency Department (HOSPITAL_COMMUNITY)
Admission: EM | Admit: 2017-06-15 | Discharge: 2017-06-15 | Disposition: A | Discharge status: Home Health | Payer: Self-pay | Attending: Emergency Medicine | Admitting: Emergency Medicine

## 2017-06-15 ENCOUNTER — Emergency Department (HOSPITAL_COMMUNITY): Payer: Self-pay

## 2017-06-15 DIAGNOSIS — R0789 Other chest pain: Secondary | ICD-10-CM | POA: Insufficient documentation

## 2017-06-15 DIAGNOSIS — N189 Chronic kidney disease, unspecified: Secondary | ICD-10-CM | POA: Insufficient documentation

## 2017-06-15 DIAGNOSIS — R911 Solitary pulmonary nodule: Secondary | ICD-10-CM | POA: Insufficient documentation

## 2017-06-15 DIAGNOSIS — J449 Chronic obstructive pulmonary disease, unspecified: Secondary | ICD-10-CM | POA: Insufficient documentation

## 2017-06-15 DIAGNOSIS — I129 Hypertensive chronic kidney disease with stage 1 through stage 4 chronic kidney disease, or unspecified chronic kidney disease: Secondary | ICD-10-CM | POA: Insufficient documentation

## 2017-06-15 DIAGNOSIS — J45909 Unspecified asthma, uncomplicated: Secondary | ICD-10-CM | POA: Insufficient documentation

## 2017-06-15 DIAGNOSIS — R079 Chest pain, unspecified: Secondary | ICD-10-CM

## 2017-06-15 DIAGNOSIS — F17210 Nicotine dependence, cigarettes, uncomplicated: Secondary | ICD-10-CM | POA: Insufficient documentation

## 2017-06-15 LAB — CBC
HEMATOCRIT: 38.9 % (ref 36.0–46.0)
Hemoglobin: 13.1 g/dL (ref 12.0–15.0)
MCH: 29.1 pg (ref 26.0–34.0)
MCHC: 33.7 g/dL (ref 30.0–36.0)
MCV: 86.4 fL (ref 78.0–100.0)
Platelets: 303 10*3/uL (ref 150–400)
RBC: 4.5 MIL/uL (ref 3.87–5.11)
RDW: 14.5 % (ref 11.5–15.5)
WBC: 9.9 10*3/uL (ref 4.0–10.5)

## 2017-06-15 LAB — BASIC METABOLIC PANEL
Anion gap: 10 (ref 5–15)
BUN: 15 mg/dL (ref 6–20)
CHLORIDE: 102 mmol/L (ref 101–111)
CO2: 23 mmol/L (ref 22–32)
Calcium: 8.8 mg/dL — ABNORMAL LOW (ref 8.9–10.3)
Creatinine, Ser: 1.09 mg/dL — ABNORMAL HIGH (ref 0.44–1.00)
GFR calc Af Amer: >60 mL/min (ref >60)
GFR calc non Af Amer: >60 mL/min (ref >60)
GLUCOSE: 84 mg/dL (ref 65–99)
POTASSIUM: 3.7 mmol/L (ref 3.5–5.1)
Sodium: 135 mmol/L (ref 135–145)

## 2017-06-15 LAB — TROPONIN I
Troponin I: <0.03 ng/mL (ref <0.03)
Troponin I: <0.03 ng/mL (ref <0.03)

## 2017-06-15 LAB — I-STAT TROPONIN, ED: Troponin i, poc: 0 ng/mL (ref 0.00–0.08)

## 2017-06-15 LAB — D-DIMER, QUANTITATIVE (NOT AT ARMC): D DIMER QUANT: 2.29 ug{FEU}/mL — AB (ref 0.00–0.50)

## 2017-06-15 MED ORDER — IOPAMIDOL (ISOVUE-370) INJECTION 76%
100.0000 mL | Freq: Once | INTRAVENOUS | Status: AC | PRN
  Administered 2017-06-15: 100 mL via INTRAVENOUS

## 2017-06-15 MED ORDER — DOXYCYCLINE HYCLATE 100 MG PO CAPS: 100.0000 mg | ORAL_CAPSULE | Freq: Two times a day (BID) | ORAL | 0 refills | Status: DC

## 2017-06-15 MED ORDER — PREDNISONE 20 MG PO TABS
ORAL_TABLET | ORAL | 0 refills | Status: DC
Start: 2017-06-15 — End: 2017-07-11

## 2017-06-15 MED ORDER — LORAZEPAM 2 MG/ML IJ SOLN
1.0000 mg | Freq: Once | INTRAMUSCULAR | Status: AC
  Administered 2017-06-15: 1 mg via INTRAVENOUS
  Filled 2017-06-15: qty 1

## 2017-06-15 MED ORDER — ALBUTEROL SULFATE HFA 108 (90 BASE) MCG/ACT IN AERS
2.0000 | INHALATION_SPRAY | Freq: Once | RESPIRATORY_TRACT | Status: AC
  Administered 2017-06-15: 2 via RESPIRATORY_TRACT
  Filled 2017-06-15: qty 6.7

## 2017-06-15 NOTE — ED Provider Notes (Signed)
[facility] DEPT Provider Note   CSN: [id number] Arrival date & time: 06/15/17  0504     History   Chief Complaint Chief Complaint  Patient presents with  . Chest Pain    HPI QUINLEY NESLER is a 46 y.o. female.  Patient with history of hypertension, GERD, depression presenting with left-sided chest pain that woke her from sleep. She reports this pain is intermittent lasting a few seconds to a few minutes at a time. It does not radiate. She is not having any pain at this time. She denies any cardiac history. The pain is associated with some shortness of breath. She denies any cough or fever or recent illness. No abdominal pain, nausea, vomiting, diaphoresis or syncope. She denies any cardiac history. She's had this pain in the past but never had it evaluated. She states the longest the pain lasts is "just a minute".   The history is provided by the patient.  Chest Pain   Associated symptoms include shortness of breath. Pertinent negatives include no abdominal pain, no cough, no dizziness, no fever, no headaches, no nausea, no vomiting and no weakness.    Past Medical History:  Diagnosis Date  . Anemia   . Asthma   . Bronchitis   . Depression   . GERD (gastroesophageal reflux disease)   . Headache   . Hypertension     Patient Active Problem List   Diagnosis Date Noted  . Essential hypertension 12/10/2016  . Chronic obstructive pulmonary disease ([place]) 12/10/2016  . Cigarette nicotine dependence with nicotine-induced disorder 12/10/2016  . Chronic kidney disease 12/10/2016  . SOB (shortness of breath) 04/07/2013  . Migraine 04/07/2013    Past Surgical History:  Procedure Laterality Date  . BREAST SURGERY    . CESAREAN SECTION    . LEG SURGERY      OB History    Gravida Para Term Preterm AB Living   1 1 1          SAB TAB Ectopic Multiple Live Births                   Home Medications    Prior to Admission medications   Medication Sig Start Date End Date  Taking? Authorizing Provider  albuterol (PROVENTIL HFA;VENTOLIN HFA) 108 (90 Base) MCG/ACT inhaler Inhale 2 puffs into the lungs every 6 (six) hours as needed for wheezing or shortness of breath. 04/29/17   Soyla Dryer, PA-C  albuterol (PROVENTIL) (2.5 MG/3ML) 0.083% nebulizer solution Take 3 mLs (2.5 mg total) by nebulization every 6 (six) hours as needed for wheezing or shortness of breath. 04/29/17   Soyla Dryer, PA-C  lisinopril-hydrochlorothiazide (PRINZIDE,ZESTORETIC) 10-12.5 MG tablet TAKE 1 TABLET BY MOUTH ONCE DAILY 06/08/17   Soyla Dryer, PA-C    Family History Family History  Problem Relation Age of Onset  . Dermatomyositis Mother   . Hypertension Mother   . Cancer Mother        lung  . Cancer Father        lung  . Heart disease Father     Social History Social History  Substance Use Topics  . Smoking status: Current Every Day Smoker    Packs/day: 1.00    Years: 23.00    Types: Cigarettes  . Smokeless tobacco: Never Used  . Alcohol use Yes     Comment: 1 beer daily      Allergies   Ibuprofen; Tramadol; and Erythromycin   Review of Systems Review of Systems  Constitutional:  Negative for activity change, appetite change, fatigue and fever.  HENT: Negative for congestion and rhinorrhea.   Eyes: Negative for photophobia and visual disturbance.  Respiratory: Positive for chest tightness and shortness of breath. Negative for cough.   Cardiovascular: Positive for chest pain.  Gastrointestinal: Negative for abdominal pain, nausea and vomiting.  Genitourinary: Negative for dysuria, hematuria, vaginal bleeding and vaginal discharge.  Musculoskeletal: Negative for arthralgias and myalgias.  Skin: Negative for rash.  Neurological: Negative for dizziness, weakness and headaches.   all other systems are negative except as noted in the HPI and PMH.     Physical Exam Updated Vital Signs BP (!) 159/83 (BP Location: Left Arm)   Pulse 81   Temp 98 F (36.7  C) (Oral)   Resp 20   Ht 5\' 4"  (1.626 m)   Wt 81.6 kg (180 lb)   LMP 06/13/2017   SpO2 100%   BMI 30.90 kg/m   Physical Exam  Constitutional: She is oriented to person, place, and time. She appears well-developed and well-nourished. No distress.  HENT:  Head: Normocephalic and atraumatic.  Mouth/Throat: Oropharynx is clear and moist. No oropharyngeal exudate.  Eyes: Pupils are equal, round, and reactive to light. Conjunctivae and EOM are normal.  Neck: Normal range of motion. Neck supple.  No meningismus.  Cardiovascular: Normal rate, regular rhythm, normal heart sounds and intact distal pulses.   No murmur heard. Pulmonary/Chest: Effort normal and breath sounds normal. No respiratory distress. She exhibits tenderness.  Chaperone present for breast exam. There is tenderness to palpation of left side of the sternum. No palpable breast masses,. No breast tenderness  Abdominal: Soft. There is no tenderness. There is no rebound and no guarding.  Musculoskeletal: Normal range of motion. She exhibits no edema or tenderness.  Neurological: She is alert and oriented to person, place, and time. No cranial nerve deficit. She exhibits normal muscle tone. Coordination normal.   5/5 strength throughout. CN 2-12 intact.Equal grip strength.   Skin: Skin is warm.  Psychiatric: She has a normal mood and affect. Her behavior is normal.  Nursing note and vitals reviewed.    ED Treatments / Results  Labs (all labs ordered are listed, but only abnormal results are displayed) Labs Reviewed  BASIC METABOLIC PANEL - Abnormal; Notable for the following:       Result Value   Creatinine, Ser 1.09 (*)    Calcium 8.8 (*)    All other components within normal limits  D-DIMER, QUANTITATIVE (NOT AT [facility]) - Abnormal; Notable for the following:    D-Dimer, Quant 2.29 (*)    All other components within normal limits  CBC  TROPONIN I  TROPONIN I  I-STAT TROPONIN, ED    EKG  EKG  Interpretation  Date/Time:  Monday June 15 2017 05:30:46 EDT Ventricular Rate:  78 PR Interval:    QRS Duration: 79 QT Interval:  371 QTC Calculation: 423 R Axis:   4 Text Interpretation:  Sinus rhythm Probable left atrial enlargement No significant change was found Confirmed by Ezequiel Essex [phone]) on 06/15/2017 5:54:47 AM       Radiology No results found.  Procedures Procedures (including critical care time)  Medications Ordered in ED Medications - No data to display   Initial Impression / Assessment and Plan / ED Course  I have reviewed the triage vital signs and the nursing notes.  Pertinent labs & imaging results that were available during my care of the patient were reviewed by me and considered  in my medical decision making (see chart for details).    Patient with intermittent left-sided chest pain lasting for a few seconds to minutes at a time. Some SOB and pain with breathing. She is chest pain-free at this time. EKG is nonischemic. No hypoxia.   Low suspicion for ACS or pulmonary embolism  Troponin is negative. CXR negative. Chest wall somewhat sore to palpation.  Troponin negative x2. Atypical for ACS. D-dimer positive.  CTPE pending at sign out.  Anticipate discharge home with atypical chest pain if negative.  Final Clinical Impressions(s) / ED Diagnoses   Final diagnoses:  Atypical chest pain    New Prescriptions New Prescriptions   No medications on file     Ezequiel Essex, MD 06/15/17 [phone]

## 2017-06-15 NOTE — ED Provider Notes (Signed)
9:07 AM Assumed care from Dr. Wyvonnia Dusky, please see their note for full history, physical and decision making until this point. In brief this is a 46 y.o. year old female who presented to the ED tonight with Chest Pain     46 year old female woke up with pleuritic chest pain and has risk factors for PE and positive d-dimer so pending CT scan is negative patient can be discharged with bronchitis treatments .   CT without evidence of pulmonary embolus or infection does have multiple nodules of which she is aware. She knows that she needs followed up in one year by her primary doctor with repeat CT scan. We'll discharge on antibiotics, steroids and inhaler.  Discharge instructions, including strict return precautions for new or worsening symptoms, given. Patient and/or family verbalized understanding and agreement with the plan as described.   Labs, studies and imaging reviewed by myself and considered in medical decision making if ordered. Imaging interpreted by radiology.  Labs Reviewed  BASIC METABOLIC PANEL - Abnormal; Notable for the following:       Result Value   Creatinine, Ser 1.09 (*)    Calcium 8.8 (*)    All other components within normal limits  D-DIMER, QUANTITATIVE (NOT AT [facility]) - Abnormal; Notable for the following:    D-Dimer, Quant 2.29 (*)    All other components within normal limits  CBC  TROPONIN I  TROPONIN I  I-STAT TROPONIN, ED    DG Chest 2 View  Final Result    CT Angio Chest PE W and/or Wo Contrast    (Results Pending)    No Follow-up on file.    Merrily Pew, MD 06/15/17 [phone]

## 2017-06-15 NOTE — Discharge Instructions (Addendum)
There is no evidence of heart attack or blood clot in the lung. Return to the ED if you develop new or worsening symptoms.

## 2017-06-15 NOTE — ED Triage Notes (Signed)
Pt c/o left side chest pain that woke her up from sleep

## 2017-06-17 ENCOUNTER — Ambulatory Visit: Payer: Medicaid Other | Admitting: Physician Assistant

## 2017-06-17 ENCOUNTER — Encounter: Payer: Self-pay | Admitting: Physician Assistant

## 2017-06-17 VITALS — BP 134/80 | HR 86 | Temp 97.9°F | Ht 64.0 in | Wt 176.0 lb

## 2017-06-17 DIAGNOSIS — J449 Chronic obstructive pulmonary disease, unspecified: Secondary | ICD-10-CM

## 2017-06-17 DIAGNOSIS — I10 Essential (primary) hypertension: Secondary | ICD-10-CM

## 2017-06-17 DIAGNOSIS — F17219 Nicotine dependence, cigarettes, with unspecified nicotine-induced disorders: Secondary | ICD-10-CM

## 2017-06-17 DIAGNOSIS — J440 Chronic obstructive pulmonary disease with acute lower respiratory infection: Principal | ICD-10-CM

## 2017-06-17 DIAGNOSIS — J209 Acute bronchitis, unspecified: Secondary | ICD-10-CM

## 2017-06-17 NOTE — Progress Notes (Signed)
BP 134/80 (BP Location: Left Arm, Patient Position: Sitting, Cuff Size: Normal)   Pulse 86   Temp 97.9 F (36.6 C)   Ht 5\' 4"  (1.626 m)   Wt 176 lb (79.8 kg)   LMP 06/13/2017   SpO2 99%   BMI 30.21 kg/m    Subjective:    Patient ID: Kristin Carson, female    DOB: Jun 23, 1971, 46 y.o.   MRN: [id number]  HPI: Kristin Carson is a 46 y.o. female presenting on 06/17/2017 for Follow-up (from ER)   HPI   Pt went to ER on 06/15/17 for CP . She had a CT that showed multiple nodules- she needs repeat CT1 yr.  She is feeling a little bit better today.  Still some coughing but pain decreased and breathing improved.  She is still smoking.   Relevant past medical, surgical, family and social history reviewed and updated as indicated. Interim medical history since our last visit reviewed. Allergies and medications reviewed and updated.   Current Outpatient Prescriptions:  .  albuterol (PROVENTIL HFA;VENTOLIN HFA) 108 (90 Base) MCG/ACT inhaler, Inhale 2 puffs into the lungs every 6 (six) hours as needed for wheezing or shortness of breath., Disp: 1 Inhaler, Rfl: 0 .  albuterol (PROVENTIL) (2.5 MG/3ML) 0.083% nebulizer solution, Take 3 mLs (2.5 mg total) by nebulization every 6 (six) hours as needed for wheezing or shortness of breath., Disp: 75 mL, Rfl: 0 .  doxycycline (VIBRAMYCIN) 100 MG capsule, Take 1 capsule (100 mg total) by mouth 2 (two) times daily. One po bid x 7 days, Disp: 14 capsule, Rfl: 0 .  lisinopril-hydrochlorothiazide (PRINZIDE,ZESTORETIC) 10-12.5 MG tablet, TAKE 1 TABLET BY MOUTH ONCE DAILY, Disp: 30 tablet, Rfl: 1 .  predniSONE (DELTASONE) 20 MG tablet, 3 tabs po daily x 3 days, then 2 tabs x 3 days, then 1.5 tabs x 3 days, then 1 tab x 3 days, then 0.5 tabs x 3 days, Disp: 27 tablet, Rfl: 0   Review of Systems  Constitutional: Negative for appetite change, chills, diaphoresis, fatigue, fever and unexpected weight change.  HENT: Positive for dental problem. Negative for  congestion, drooling, ear pain, facial swelling, hearing loss, mouth sores, sneezing, sore throat, trouble swallowing and voice change.   Eyes: Negative for pain, discharge, redness, itching and visual disturbance.  Respiratory: Positive for cough, chest tightness and shortness of breath. Negative for choking and wheezing.   Cardiovascular: Negative for chest pain, palpitations and leg swelling.  Gastrointestinal: Negative for abdominal pain, blood in stool, constipation, diarrhea and vomiting.  Endocrine: Negative for cold intolerance, heat intolerance and polydipsia.  Genitourinary: Negative for decreased urine volume, dysuria and hematuria.  Musculoskeletal: Positive for back pain. Negative for arthralgias and gait problem.  Skin: Negative for rash.  Allergic/Immunologic: Negative for environmental allergies.  Neurological: Negative for seizures, syncope, light-headedness and headaches.  Hematological: Negative for adenopathy.  Psychiatric/Behavioral: Negative for agitation, dysphoric mood and suicidal ideas. The patient is not nervous/anxious.     Per HPI unless specifically indicated above     Objective:    BP 134/80 (BP Location: Left Arm, Patient Position: Sitting, Cuff Size: Normal)   Pulse 86   Temp 97.9 F (36.6 C)   Ht 5\' 4"  (1.626 m)   Wt 176 lb (79.8 kg)   LMP 06/13/2017   SpO2 99%   BMI 30.21 kg/m   Wt Readings from Last 3 Encounters:  06/17/17 176 lb (79.8 kg)  06/15/17 180 lb (81.6 kg)  04/21/17 179 lb  12 oz (81.5 kg)    Physical Exam  Constitutional: She is oriented to person, place, and time. She appears well-developed and well-nourished.  HENT:  Head: Normocephalic and atraumatic.  Neck: Neck supple.  Cardiovascular: Normal rate and regular rhythm.   Pulmonary/Chest: Effort normal and breath sounds normal.  Abdominal: Soft. Bowel sounds are normal. She exhibits no mass. There is no hepatosplenomegaly. There is no tenderness.  Musculoskeletal: She exhibits  no edema.  Lymphadenopathy:    She has no cervical adenopathy.  Neurological: She is alert and oriented to person, place, and time.  Skin: Skin is warm and dry.  Psychiatric: She has a normal mood and affect. Her behavior is normal.  Vitals reviewed.   Results for orders placed or performed during the hospital encounter of 10/93/23  Basic metabolic panel  Result Value Ref Range   Sodium 135 135 - 145 mmol/L   Potassium 3.7 3.5 - 5.1 mmol/L   Chloride 102 101 - 111 mmol/L   CO2 23 22 - 32 mmol/L   Glucose, Bld 84 65 - 99 mg/dL   BUN 15 6 - 20 mg/dL   Creatinine, Ser 1.09 (H) 0.44 - 1.00 mg/dL   Calcium 8.8 (L) 8.9 - 10.3 mg/dL   GFR calc non Af Amer >60 >60 mL/min   GFR calc Af Amer >60 >60 mL/min   Anion gap 10 5 - 15  CBC  Result Value Ref Range   WBC 9.9 4.0 - 10.5 K/uL   RBC 4.50 3.87 - 5.11 MIL/uL   Hemoglobin 13.1 12.0 - 15.0 g/dL   HCT 38.9 36.0 - 46.0 %   MCV 86.4 78.0 - 100.0 fL   MCH 29.1 26.0 - 34.0 pg   MCHC 33.7 30.0 - 36.0 g/dL   RDW 14.5 11.5 - 15.5 %   Platelets 303 150 - 400 K/uL  D-dimer, quantitative (not at [facility])  Result Value Ref Range   D-Dimer, Quant 2.29 (H) 0.00 - 0.50 ug/mL-FEU  Troponin I  Result Value Ref Range   Troponin I <0.03 <0.03 ng/mL  Troponin I  Result Value Ref Range   Troponin I <0.03 <0.03 ng/mL  I-stat troponin, ED  Result Value Ref Range   Troponin i, poc 0.00 0.00 - 0.08 ng/mL   Comment 3              Assessment & Plan:   Encounter Diagnoses  Name Primary?  . Acute bronchitis with COPD ([place]) Yes  . Chronic obstructive pulmonary disease, unspecified COPD type ([place])   . Cigarette nicotine dependence with nicotine-induced disorder   . Essential hypertension      -pt to continue current medications -pt counseled on smoking cessation -pt to follow up next month as scheduled.  She is to RTO sooner if her breathing worsens or other problem

## 2017-07-06 ENCOUNTER — Other Ambulatory Visit: Payer: Self-pay

## 2017-07-06 ENCOUNTER — Emergency Department (HOSPITAL_COMMUNITY)
Admission: EM | Admit: 2017-07-06 | Discharge: 2017-07-06 | Disposition: A | Discharge status: Home / Self Care | Payer: Self-pay | Attending: Emergency Medicine | Admitting: Emergency Medicine

## 2017-07-06 ENCOUNTER — Emergency Department (HOSPITAL_COMMUNITY): Payer: Self-pay

## 2017-07-06 ENCOUNTER — Encounter (HOSPITAL_COMMUNITY): Payer: Self-pay | Admitting: *Deleted

## 2017-07-06 DIAGNOSIS — N189 Chronic kidney disease, unspecified: Secondary | ICD-10-CM | POA: Insufficient documentation

## 2017-07-06 DIAGNOSIS — I129 Hypertensive chronic kidney disease with stage 1 through stage 4 chronic kidney disease, or unspecified chronic kidney disease: Secondary | ICD-10-CM | POA: Insufficient documentation

## 2017-07-06 DIAGNOSIS — Y999 Unspecified external cause status: Secondary | ICD-10-CM | POA: Insufficient documentation

## 2017-07-06 DIAGNOSIS — R51 Headache: Secondary | ICD-10-CM | POA: Insufficient documentation

## 2017-07-06 DIAGNOSIS — J449 Chronic obstructive pulmonary disease, unspecified: Secondary | ICD-10-CM | POA: Insufficient documentation

## 2017-07-06 DIAGNOSIS — Y939 Activity, unspecified: Secondary | ICD-10-CM | POA: Insufficient documentation

## 2017-07-06 DIAGNOSIS — Y92009 Unspecified place in unspecified non-institutional (private) residence as the place of occurrence of the external cause: Secondary | ICD-10-CM | POA: Insufficient documentation

## 2017-07-06 DIAGNOSIS — Z79899 Other long term (current) drug therapy: Secondary | ICD-10-CM | POA: Insufficient documentation

## 2017-07-06 DIAGNOSIS — S0083XA Contusion of other part of head, initial encounter: Secondary | ICD-10-CM | POA: Insufficient documentation

## 2017-07-06 MED ORDER — ACETAMINOPHEN 325 MG PO TABS
650.0000 mg | ORAL_TABLET | Freq: Once | ORAL | Status: AC
  Administered 2017-07-06: 650 mg via ORAL
  Filled 2017-07-06: qty 2

## 2017-07-06 NOTE — Discharge Instructions (Signed)
Use ice over the painful area. Take acetaminophen 650 mg every 6 hrs as needed for pain. Return to the ED for any problems listed on the head injury sheet.
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
## 2017-07-11 ENCOUNTER — Other Ambulatory Visit: Payer: Self-pay

## 2017-07-11 ENCOUNTER — Encounter (HOSPITAL_COMMUNITY): Payer: Self-pay | Admitting: Emergency Medicine

## 2017-07-11 ENCOUNTER — Emergency Department (HOSPITAL_COMMUNITY)
Admission: EM | Admit: 2017-07-11 | Discharge: 2017-07-11 | Disposition: A | Discharge status: Home / Self Care | Payer: Self-pay | Attending: Emergency Medicine | Admitting: Emergency Medicine

## 2017-07-11 DIAGNOSIS — I129 Hypertensive chronic kidney disease with stage 1 through stage 4 chronic kidney disease, or unspecified chronic kidney disease: Secondary | ICD-10-CM | POA: Insufficient documentation

## 2017-07-11 DIAGNOSIS — R220 Localized swelling, mass and lump, head: Secondary | ICD-10-CM | POA: Insufficient documentation

## 2017-07-11 DIAGNOSIS — T7840XA Allergy, unspecified, initial encounter: Secondary | ICD-10-CM | POA: Insufficient documentation

## 2017-07-11 DIAGNOSIS — J45909 Unspecified asthma, uncomplicated: Secondary | ICD-10-CM | POA: Insufficient documentation

## 2017-07-11 DIAGNOSIS — J449 Chronic obstructive pulmonary disease, unspecified: Secondary | ICD-10-CM | POA: Insufficient documentation

## 2017-07-11 DIAGNOSIS — F17210 Nicotine dependence, cigarettes, uncomplicated: Secondary | ICD-10-CM | POA: Insufficient documentation

## 2017-07-11 DIAGNOSIS — N189 Chronic kidney disease, unspecified: Secondary | ICD-10-CM | POA: Insufficient documentation

## 2017-07-11 DIAGNOSIS — L509 Urticaria, unspecified: Secondary | ICD-10-CM

## 2017-07-11 MED ORDER — DIPHENHYDRAMINE HCL 25 MG PO CAPS
50.0000 mg | ORAL_CAPSULE | Freq: Once | ORAL | Status: AC
  Administered 2017-07-11: 50 mg via ORAL
  Filled 2017-07-11: qty 2

## 2017-07-11 MED ORDER — FAMOTIDINE IN NACL 20-0.9 MG/50ML-% IV SOLN
20.0000 mg | Freq: Once | INTRAVENOUS | Status: AC
  Administered 2017-07-11: 20 mg via INTRAVENOUS
  Filled 2017-07-11: qty 50

## 2017-07-11 MED ORDER — METHYLPREDNISOLONE SODIUM SUCC 125 MG IJ SOLR
125.0000 mg | Freq: Once | INTRAMUSCULAR | Status: AC
  Administered 2017-07-11: 125 mg via INTRAVENOUS
  Filled 2017-07-11: qty 2

## 2017-07-11 MED ORDER — PREDNISONE 20 MG PO TABS: 40.0000 mg | ORAL_TABLET | Freq: Every day | ORAL | 0 refills | Status: AC

## 2017-07-11 MED ORDER — SODIUM CHLORIDE 0.9 % IV BOLUS (SEPSIS)
500.0000 mL | Freq: Once | INTRAVENOUS | Status: AC
  Administered 2017-07-11: 500 mL via INTRAVENOUS

## 2017-07-11 MED ORDER — DIPHENHYDRAMINE HCL 25 MG PO CAPS: 25.0000 mg | ORAL_CAPSULE | Freq: Four times a day (QID) | ORAL | 0 refills | Status: DC | PRN

## 2017-07-11 MED ORDER — FAMOTIDINE 40 MG PO TABS: 40.0000 mg | ORAL_TABLET | Freq: Every day | ORAL | 0 refills | Status: DC

## 2017-07-11 MED ORDER — EPINEPHRINE 0.3 MG/0.3ML IJ SOAJ: 0.3000 mg | Freq: Once | INTRAMUSCULAR | 0 refills | Status: AC

## 2017-07-11 NOTE — ED Notes (Signed)
Pt stable at this time. Pt denies any swelling of airway. States her itching has gone away. NAD noted

## 2017-07-11 NOTE — ED Notes (Signed)
Pt denies any airway problem. Educated to make nurse aware if throat becomes scratchy or irritated.

## 2017-07-11 NOTE — ED Triage Notes (Signed)
Patient c/o hives to legs, arms, abd, and face. Patient also reports swelling to lips. Denies any difficulty breathing or swallowing. Denies any tightness in throat. Per patient started at 10 am. Denies and new foods, lotions, or medications. Patient does report new laundry detergent.

## 2017-07-11 NOTE — ED Provider Notes (Signed)
Emergency Department Provider Note   I have reviewed the triage vital signs and the nursing notes.   HISTORY  Chief Complaint Urticaria   HPI Kristin Carson is a 46 y.o. female with PMH of GERD, depression, asthma, and HTN presents to the emergency department for evaluation of itchy, rash to the arms, leg, and face.  States that symptoms began this morning with no obvious inciting event.  No modifying factors.  She continues to have itching and rash all over her body.  She reports progression to the lips with some mild swelling.  Denies any itching sensation in the tongue or throat.  No swelling in the tongue or throat.  No difficulty breathing.  No similar reaction in the past.  She has not taken any medication prior to ED presentation. She did start using a new laundry detergent recently.   Past Medical History:  Diagnosis Date  . Anemia   . Asthma   . Bronchitis   . Depression   . GERD (gastroesophageal reflux disease)   . Headache   . Hypertension     Patient Active Problem List   Diagnosis Date Noted  . Essential hypertension 12/10/2016  . Chronic obstructive pulmonary disease ([place]) 12/10/2016  . Cigarette nicotine dependence with nicotine-induced disorder 12/10/2016  . Chronic kidney disease 12/10/2016  . SOB (shortness of breath) 04/07/2013  . Migraine 04/07/2013    Past Surgical History:  Procedure Laterality Date  . BREAST SURGERY    . CESAREAN SECTION    . LEG SURGERY      Current Outpatient Rx  . Order #: [id number] Class: Normal  . Order #: [id number] Class: Normal  . Order #: [id number] Class: Print  . Order #: [id number] Class: Print  . Order #: [id number] Class: Print  . Order #: [id number] Class: Print  . Order #: [id number] Class: Normal  . [START ON 07/12/2017] Order #: [id number] Class: Print    Allergies Ibuprofen; Tramadol; and Erythromycin  Family History  Problem Relation Age of Onset  . Dermatomyositis Mother   . Hypertension Mother   . Cancer  Mother        lung  . Cancer Father        lung  . Heart disease Father     Social History Social History   Tobacco Use  . Smoking status: Current Every Day Smoker    Packs/day: 1.00    Years: 23.00    Pack years: 23.00    Types: Cigarettes  . Smokeless tobacco: Never Used  Substance Use Topics  . Alcohol use: Yes    Comment: 1 beer daily   . Drug use: Yes    Types: Marijuana    Comment: marijuana yesterday     Review of Systems  Constitutional: No fever/chills Eyes: No visual changes. ENT: No sore throat. Cardiovascular: Denies chest pain. Respiratory: Denies shortness of breath. Gastrointestinal: No abdominal pain.  No nausea, no vomiting.  No diarrhea.  No constipation. Genitourinary: Negative for dysuria. Musculoskeletal: Negative for back pain. Skin: Positive itchy rash over body and face.  Neurological: Negative for headaches, focal weakness or numbness.  10-point ROS otherwise negative.  ____________________________________________   PHYSICAL EXAM:  VITAL SIGNS: ED Triage Vitals  Enc Vitals Group     BP 07/11/17 1203 (!) 157/97     Pulse Rate 07/11/17 1203 (!) 108     Resp 07/11/17 1203 18     Temp 07/11/17 1203 98 F (36.7 C)     Temp Source 07/11/17 1203 Oral  SpO2 07/11/17 1203 100 %     Weight 07/11/17 1205 176 lb (79.8 kg)     Height 07/11/17 1205 5\' 4"  (1.626 m)    Constitutional: Alert and oriented. Well appearing and in no acute distress. Eyes: Conjunctivae are normal.  Head: Atraumatic. Nose: No congestion/rhinnorhea. Mouth/Throat: Mucous membranes are moist.  Oropharynx non-erythematous. Widely patent oropharynx. Speaking in a normal tone of voice and managing oral secretions.  Neck: No stridor.  Cardiovascular: Tachycardia. Good peripheral circulation. Grossly normal heart sounds.   Respiratory: Normal respiratory effort.  No retractions. Lungs CTAB. Gastrointestinal: Soft and nontender. No distention.  Musculoskeletal: No lower  extremity tenderness nor edema. No gross deformities of extremities. Neurologic:  Normal speech and language. No gross focal neurologic deficits are appreciated.  Skin:  Skin is warm, dry and intact. Hives noted over thighs, abdomen, and arms. Mild lower lip swelling.   ____________________________________________  RADIOLOGY  No results found.  ____________________________________________   PROCEDURES  Procedure(s) performed:   Procedures  None ____________________________________________   INITIAL IMPRESSION / ASSESSMENT AND PLAN / ED COURSE  Pertinent labs & imaging results that were available during my care of the patient were reviewed by me and considered in my medical decision making (see chart for details).  Patient presents emergency department for evaluation of itchy rash over the body.  She has some mild lip swelling.  The oropharynx is widely patent.  Patient in no acute distress.  No indication for epinephrine at this time.  Plan for IV fluids, steroids, Pepcid, Benadryl.  Plan for brief ED observation period followed by reassessment and likely discharge home with EpiPen Rx and plan for PCP follow up and possible allergist referral.   01:35 PM On reassessment the patient's symptoms have significantly improved.  Her rash is resolving.  Lip swelling decreasing significantly.  I Trinia Georgi discussion with her regarding treatment plan moving forward including steroid burst, PCP follow-up, prescription of EpiPen.  I described she should keep this on her at all times and discussed how to administer it in detail.   At this time, I do not feel there is any life-threatening condition present. I have reviewed and discussed all results (EKG, imaging, lab, urine as appropriate), exam findings with patient. I have reviewed nursing notes and appropriate previous records.  I feel the patient is safe to be discharged home without further emergent workup. Discussed usual and customary return  precautions. Patient and family (if present) verbalize understanding and are comfortable with this plan.  Patient will follow-up with their primary care provider. If they do not have a primary care provider, information for follow-up has been provided to them. All questions have been answered.  ____________________________________________  FINAL CLINICAL IMPRESSION(S) / ED DIAGNOSES  Final diagnoses:  Allergic reaction, initial encounter  Hives  Lip swelling     MEDICATIONS GIVEN DURING THIS VISIT:  Medications  sodium chloride 0.9 % bolus 500 mL (0 mLs Intravenous Stopped 07/11/17 1323)  methylPREDNISolone sodium succinate (SOLU-MEDROL) 125 mg/2 mL injection 125 mg (125 mg Intravenous Given 07/11/17 1233)  famotidine (PEPCID) IVPB 20 mg premix (0 mg Intravenous Stopped 07/11/17 1323)  diphenhydrAMINE (BENADRYL) capsule 50 mg (50 mg Oral Given 07/11/17 1233)     NEW OUTPATIENT MEDICATIONS STARTED DURING THIS VISIT:  Prednisone 40 mg x 4 days starting tomorrow.  Benadryl Pepcid EpiPen   Note:  This document was prepared using Dragon voice recognition software and may include unintentional dictation errors.  Nanda Quinton, MD Emergency Medicine    Deston Bilyeu,  [facility], MD 07/11/17 [phone]

## 2017-07-11 NOTE — Discharge Instructions (Signed)
You have been seen in the Emergency Department (ED) today for an allergic reaction.  You have been stable throughout your stay in the Emergency Department.  Please take your medications as prescribed and follow up with your doctor as indicated.  You should also take over-the-counter Benadryl around the clock for the next three days according to the dosing instructions on the package.  Please keep your Epi-Pen with you at all times and use it if experience shortness of breath or difficulty breathing or if you believe you are having a severe allergic reaction.  If you use the Epi-Pen, though, please call 911 afterwards or go immediately to your nearest Emergency Department.  Return to the Emergency Department (ED) if you experience any worsening or new symptoms that concern you.  

## 2017-07-12 ENCOUNTER — Emergency Department (HOSPITAL_COMMUNITY): Payer: Self-pay

## 2017-07-12 ENCOUNTER — Emergency Department (HOSPITAL_COMMUNITY)
Admission: EM | Admit: 2017-07-12 | Discharge: 2017-07-12 | Disposition: A | Discharge status: Home / Self Care | Payer: Self-pay | Attending: Emergency Medicine | Admitting: Emergency Medicine

## 2017-07-12 ENCOUNTER — Encounter (HOSPITAL_COMMUNITY): Payer: Self-pay | Admitting: Emergency Medicine

## 2017-07-12 ENCOUNTER — Other Ambulatory Visit: Payer: Self-pay

## 2017-07-12 DIAGNOSIS — R0602 Shortness of breath: Secondary | ICD-10-CM | POA: Insufficient documentation

## 2017-07-12 DIAGNOSIS — F17210 Nicotine dependence, cigarettes, uncomplicated: Secondary | ICD-10-CM | POA: Insufficient documentation

## 2017-07-12 DIAGNOSIS — I10 Essential (primary) hypertension: Secondary | ICD-10-CM | POA: Insufficient documentation

## 2017-07-12 DIAGNOSIS — F1210 Cannabis abuse, uncomplicated: Secondary | ICD-10-CM | POA: Insufficient documentation

## 2017-07-12 DIAGNOSIS — R059 Cough, unspecified: Secondary | ICD-10-CM

## 2017-07-12 DIAGNOSIS — R05 Cough: Secondary | ICD-10-CM | POA: Insufficient documentation

## 2017-07-12 DIAGNOSIS — J45909 Unspecified asthma, uncomplicated: Secondary | ICD-10-CM | POA: Insufficient documentation

## 2017-07-12 MED ORDER — IPRATROPIUM-ALBUTEROL 0.5-2.5 (3) MG/3ML IN SOLN
3.0000 mL | Freq: Once | RESPIRATORY_TRACT | Status: AC
  Administered 2017-07-12: 3 mL via RESPIRATORY_TRACT
  Filled 2017-07-12: qty 3

## 2017-07-12 NOTE — ED Triage Notes (Signed)
Per ems, pt was seen here yesterday for allergic reaction and was sent home. Pt woke up around 6am this morning out of sleep unable to breath, feeling like throat was closing up and hard to swollow.  02-100% on arrival.

## 2017-07-12 NOTE — ED Provider Notes (Signed)
Emergency Department Provider Note   I have reviewed the triage vital signs and the nursing notes.   HISTORY  Chief Complaint Allergic Reaction   HPI Kristin Carson is a 46 y.o. female history of anemia, asthma and hypertension the presents to the emergency department today for difficulty swallowing.  Patient is not a great historian however it sounds like she was here yesterday for a rash consistent with allergic reaction.  She went home and she woke up this morning at first she says she felt like she could not breathe.  When this was elucidated further sounds like the issue was that she was coughing up phlegm which made her a little bit short of breath.  Does not sound like there was at any point that she really had difficulty breathing is just that she would cough and phlegm would come up.  This made her think that she could not swallow.  She states that since she is got here she has felt better without intervention.  On my evaluation patient still feels like she has a little bit of a productive cough but she is not short of breath, chest pain, nausea, vomiting or rash.  No other associated symptoms.  No recent fevers.  She does have a history of smoking.  No other associated modifying symptoms.    Past Medical History:  Diagnosis Date  . Anemia   . Asthma   . Bronchitis   . Depression   . GERD (gastroesophageal reflux disease)   . Headache   . Hypertension     Patient Active Problem List   Diagnosis Date Noted  . Essential hypertension 12/10/2016  . Chronic obstructive pulmonary disease ([place]) 12/10/2016  . Cigarette nicotine dependence with nicotine-induced disorder 12/10/2016  . Chronic kidney disease 12/10/2016  . SOB (shortness of breath) 04/07/2013  . Migraine 04/07/2013    Past Surgical History:  Procedure Laterality Date  . BREAST SURGERY    . CESAREAN SECTION    . LEG SURGERY      Current Outpatient Rx  . Order #: [id number] Class: Normal  . Order #:  [id number] Class: Normal  . Order #: [id number] Class: Print  . Order #: [id number] Class: Print  . Order #: [id number] Class: Print  . Order #: [id number] Class: Normal  . Order #: [id number] Class: Print    Allergies Ibuprofen; Tramadol; and Erythromycin  Family History  Problem Relation Age of Onset  . Dermatomyositis Mother   . Hypertension Mother   . Cancer Mother        lung  . Cancer Father        lung  . Heart disease Father     Social History Social History   Tobacco Use  . Smoking status: Current Every Day Smoker    Packs/day: 1.00    Years: 23.00    Pack years: 23.00    Types: Cigarettes  . Smokeless tobacco: Never Used  Substance Use Topics  . Alcohol use: Yes    Comment: 1 beer daily   . Drug use: Yes    Types: Marijuana    Comment: marijuana yesterday     Review of Systems  All other systems negative except as documented in the HPI. All pertinent positives and negatives as reviewed in the HPI. ____________________________________________   PHYSICAL EXAM:  VITAL SIGNS: ED Triage Vitals  Enc Vitals Group     BP 07/12/17 0645 (!) 153/83     Pulse Rate 07/12/17 0645 85     Resp 07/12/17 0645 18  Temp 07/12/17 0645 98.3 F (36.8 C)     Temp src --      SpO2 07/12/17 0645 100 %     Weight 07/12/17 0648 176 lb (79.8 kg)     Height 07/12/17 0648 5\' 4"  (1.626 m)    Constitutional: Alert and oriented. Well appearing and in no acute distress. Eyes: Conjunctivae are normal. PERRL. EOMI. Head: Atraumatic. Nose: No congestion/rhinnorhea. Mouth/Throat: Mucous membranes are moist.  Oropharynx non-erythematous. Neck: No stridor.  No meningeal signs.   Cardiovascular: Normal rate, regular rhythm. Good peripheral circulation. Grossly normal heart sounds.   Respiratory: Normal respiratory effort.  No retractions. Lungs with mild wheezing. Gastrointestinal: Soft and nontender. No distention.  Musculoskeletal: No lower extremity tenderness nor edema. No gross  deformities of extremities. Neurologic:  Normal speech and language. No gross focal neurologic deficits are appreciated.  Skin:  Skin is warm, dry and intact. No rash noted.   ____________________________________________   GYIRSWNIO  Dg Chest 2 View  Result Date: 07/12/2017 CLINICAL DATA:  Cough and sore throat EXAM: CHEST  2 VIEW COMPARISON:  06/15/2017; 12/10/2015; 04/06/2017; chest CT - 06/05/2017 FINDINGS: Grossly unchanged cardiac silhouette and mediastinal contours. Mild diffuse slightly nodular thickening of the pulmonary interstitium, similar to the 04/2015 examination. No new focal airspace opacities. No pleural effusion pneumothorax. No evidence of edema. No acute osseus abnormalities. IMPRESSION: Chronic bronchitic change without superimposed acute cardiopulmonary disease. Specifically, no focal airspace opacities to suggest pneumonia. Electronically Signed   By: Sandi Mariscal M.D.   On: 07/12/2017 08:38    ____________________________________________   PROCEDURES  Procedure(s) performed:   Procedures   ____________________________________________   INITIAL IMPRESSION / ASSESSMENT AND PLAN / ED COURSE  Pertinent labs & imaging results that were available during my care of the patient were reviewed by me and considered in my medical decision making (see chart for details).  Although this could be a delayed allergic reaction, suspect that unrelated to what was going on yesterday.  She seems to just have a productive cough with mild wheezing.  Possible developing upper respiratory infection.  Will give a breathing treatment and get a chest x-ray and just observe for this time.  Low suspicion for anaphylaxis at this time.  Multiple re-evaluations patient is protecting airway, normal oxygen, normal respiratory rate and no stridor.  She is tolerating p.o. liquids multiple times.  I suspect this is likely bronchitis as her symptoms did improve with a DuoNeb.  Once again I doubt  anaphylaxis.  She is already on steroids at home and will continue those.  Ill ask her to use her albuterol as needed for coughing.   ____________________________________________  FINAL CLINICAL IMPRESSION(S) / ED DIAGNOSES  Final diagnoses:  Cough     MEDICATIONS GIVEN DURING THIS VISIT:  Medications  ipratropium-albuterol (DUONEB) 0.5-2.5 (3) MG/3ML nebulizer solution 3 mL (3 mLs Nebulization Given 07/12/17 0825)     NEW OUTPATIENT MEDICATIONS STARTED DURING THIS VISIT:  This SmartLink is deprecated. Use AVSMEDLIST instead to display the medication list for a patient.  Note:  This document was prepared using Dragon voice recognition software and may include unintentional dictation errors.   Merrily Pew, MD 07/12/17 [phone]

## 2017-07-14 NOTE — Congregational Nurse Program (Signed)
Congregational Nurse Program Note  Date of Encounter: 07/14/2017  Past Medical History: Past Medical History:  Diagnosis Date  . Anemia   . Asthma   . Bronchitis   . Depression   . GERD (gastroesophageal reflux disease)   . Headache   . Hypertension     Encounter Details: CNP Questionnaire - 07/14/17 0958      Questionnaire   Patient Status  Not Applicable    Race  Black or African American    Location Patient Served At  [employer] [employer]  Not Applicable    Uninsured  Uninsured (NEW 1x/quarter)    Food  No food insecurities    Housing/Utilities  Yes, have permanent housing    Transportation  No transportation needs    Interpersonal Safety  Yes, feel physically and emotionally safe where you currently live    Medication  Provided medication assistance;Yes, have medication insecurities    Medical Provider  Yes    Referrals  Primary Care Provider/Clinic;Medication Assistance    ED Visit Averted  Not Applicable    Life-Saving Intervention Made  Not Applicable      Met with a client today who was recently seen in [facility] ER on 07/11/17 for allergic reaction and 07/12/17 for cough. Client was prescribed prednisone, pepcid, and benedryl. Client could not afford the medications. Client is here today seeking assistance with prescriptions.  Program will assist with Prednisone today and assisted client in making a follow up appointment [facility] 07[facility]15[facility]17 at 10:00 am as directed by ED. Client reports that her rash, itching and swelling had subsided on 07/11/17 with treatment in the ER and she has had no further rash or swelling. She has changed her laundry detergent and has rewashed all her linens and clothing.  Client states she was also given a prescription for Epi-Pen . RN advised client to bring that prescription to her appointment with her Primary care provider for assistance in obtaining the EPI-Pen through a drug assistance program. Encouraged  client to discuss with provider the need for the benedryl and pepcid at this point. Client states she does still have the cough that is productive at times but denies shortness of breath at this time.  RN did let client know that pepcid and benedryl can be purchased over the counter and to ask her pharmacist for assistance in obtaining the less expensive store brand of those medications if needed. Client states understanding.  Will follow as needed.  Client assures RN that she is continuing to go to [facility] for her mental health services.

## 2017-07-15 ENCOUNTER — Encounter: Payer: Self-pay | Admitting: Physician Assistant

## 2017-07-15 ENCOUNTER — Ambulatory Visit: Payer: Medicaid Other | Admitting: Physician Assistant

## 2017-07-15 VITALS — BP 128/74 | HR 84 | Temp 97.3°F | Ht 64.0 in | Wt 181.0 lb

## 2017-07-15 DIAGNOSIS — L501 Idiopathic urticaria: Secondary | ICD-10-CM

## 2017-07-15 NOTE — Patient Instructions (Signed)
claritin (loratidine) or zyrtec (cetirizine) Benedryl.

## 2017-07-15 NOTE — Progress Notes (Signed)
BP 128/74 (BP Location: Left Arm, Patient Position: Sitting, Cuff Size: Normal)   Pulse 84   Temp (!) 97.3 F (36.3 C) (Other (Comment))   Ht 5\' 4"  (1.626 m)   Wt 181 lb (82.1 kg)   LMP 07/11/2017   SpO2 99%   BMI 31.07 kg/m    Subjective:    Patient ID: Kristin Carson, female    DOB: [birth year], 46 y.o.   MRN: [id number]  HPI: Kristin Carson is a 46 y.o. female presenting on 07/15/2017 for Follow-up (from ER)   HPI Pt was seen in ER x 2.  Pt states her cough is better.    Pt had some itching yesterday.  And also rash.   She was seen in ER for rash on Tuesday.      Pt went back to her old laundry detergent.   ER notes reviewed  Relevant past medical, surgical, family and social history reviewed and updated as indicated. Interim medical history since our last visit reviewed. Allergies and medications reviewed and updated.   Current Outpatient Medications:  .  albuterol (PROVENTIL HFA;VENTOLIN HFA) 108 (90 Base) MCG/ACT inhaler, Inhale 2 puffs into the lungs every 6 (six) hours as needed for wheezing or shortness of breath., Disp: 1 Inhaler, Rfl: 0 .  albuterol (PROVENTIL) (2.5 MG/3ML) 0.083% nebulizer solution, Take 3 mLs (2.5 mg total) by nebulization every 6 (six) hours as needed for wheezing or shortness of breath., Disp: 75 mL, Rfl: 0 .  diphenhydrAMINE (BENADRYL) 25 mg capsule, Take 1 capsule (25 mg total) every 6 (six) hours as needed for up to 7 days by mouth for itching or allergies., Disp: 30 capsule, Rfl: 0 .  lisinopril-hydrochlorothiazide (PRINZIDE,ZESTORETIC) 10-12.5 MG tablet, TAKE 1 TABLET BY MOUTH ONCE DAILY, Disp: 30 tablet, Rfl: 1 .  predniSONE (DELTASONE) 20 MG tablet, Take 2 tablets (40 mg total) daily for 4 days by mouth., Disp: 8 tablet, Rfl: 0 .  famotidine (PEPCID) 40 MG tablet, Take 1 tablet (40 mg total) daily for 7 days by mouth. (Patient not taking: Reported on 07/15/2017), Disp: 7 tablet, Rfl: 0  Review of Systems  Constitutional: Negative for  appetite change, chills, diaphoresis, fatigue, fever and unexpected weight change.  HENT: Negative for congestion, dental problem, drooling, ear pain, facial swelling, hearing loss, mouth sores, sneezing, sore throat, trouble swallowing and voice change.   Eyes: Positive for itching. Negative for pain, discharge, redness and visual disturbance.  Respiratory: Negative for cough, choking, shortness of breath and wheezing.   Cardiovascular: Negative for chest pain, palpitations and leg swelling.  Gastrointestinal: Negative for abdominal pain, blood in stool, constipation, diarrhea and vomiting.  Endocrine: Negative for cold intolerance, heat intolerance and polydipsia.  Genitourinary: Negative for decreased urine volume, dysuria and hematuria.  Musculoskeletal: Negative for arthralgias, back pain and gait problem.  Skin: Negative for rash.  Allergic/Immunologic: Negative for environmental allergies.  Neurological: Negative for seizures, syncope, light-headedness and headaches.  Hematological: Negative for adenopathy.  Psychiatric/Behavioral: Negative for agitation, dysphoric mood and suicidal ideas. The patient is not nervous/anxious.     Per HPI unless specifically indicated above     Objective:    BP 128/74 (BP Location: Left Arm, Patient Position: Sitting, Cuff Size: Normal)   Pulse 84   Temp (!) 97.3 F (36.3 C) (Other (Comment))   Ht 5\' 4"  (1.626 m)   Wt 181 lb (82.1 kg)   LMP 07/11/2017   SpO2 99%   BMI 31.07 kg/m   Wt Readings  from Last 3 Encounters:  07/15/17 181 lb (82.1 kg)  07/12/17 176 lb (79.8 kg)  07/11/17 176 lb (79.8 kg)    Physical Exam  Constitutional: She is oriented to person, place, and time. She appears well-developed and well-nourished.  HENT:  Head: Normocephalic and atraumatic.  No swelling  Neck: Neck supple.  Cardiovascular: Normal rate and regular rhythm.  Pulmonary/Chest: Effort normal and breath sounds normal. No respiratory distress. She has no  wheezes. She has no rales.  Abdominal: Soft. Bowel sounds are normal. She exhibits no mass. There is no hepatosplenomegaly. There is no tenderness.  Musculoskeletal: She exhibits no edema.  Lymphadenopathy:    She has no cervical adenopathy.  Neurological: She is alert and oriented to person, place, and time.  Skin: Skin is warm and dry. No rash noted. No erythema.  Psychiatric: She has a normal mood and affect. Her behavior is normal.  Vitals reviewed.       Assessment & Plan:   Encounter Diagnosis  Name Primary?  . Idiopathic urticaria Yes     -discussed with pt sounds not like anaphalactic reaction but like idiopathic urticaria.  Encouraged to use otc antihistamine regularly for next week or two then as needed. -follow up as scheduled.  RTO sooner prn

## 2017-07-28 ENCOUNTER — Ambulatory Visit: Payer: Medicaid Other | Admitting: Physician Assistant

## 2017-07-28 ENCOUNTER — Encounter: Payer: Self-pay | Admitting: Physician Assistant

## 2017-07-28 VITALS — BP 132/78 | HR 95 | Temp 97.3°F | Ht 64.0 in | Wt 182.5 lb

## 2017-07-28 DIAGNOSIS — I10 Essential (primary) hypertension: Secondary | ICD-10-CM

## 2017-07-28 DIAGNOSIS — N189 Chronic kidney disease, unspecified: Secondary | ICD-10-CM

## 2017-07-28 DIAGNOSIS — F17219 Nicotine dependence, cigarettes, with unspecified nicotine-induced disorders: Secondary | ICD-10-CM

## 2017-07-28 DIAGNOSIS — J449 Chronic obstructive pulmonary disease, unspecified: Secondary | ICD-10-CM

## 2017-07-28 MED ORDER — ALBUTEROL SULFATE (2.5 MG/3ML) 0.083% IN NEBU: 2.5000 mg | INHALATION_SOLUTION | Freq: Four times a day (QID) | RESPIRATORY_TRACT | 1 refills | Status: DC | PRN

## 2017-07-28 MED ORDER — LISINOPRIL-HYDROCHLOROTHIAZIDE 20-12.5 MG PO TABS: 1.0000 | ORAL_TABLET | Freq: Every day | ORAL | 3 refills | Status: DC

## 2017-07-28 MED ORDER — LISINOPRIL-HYDROCHLOROTHIAZIDE 20-12.5 MG PO TABS: 1.0000 | ORAL_TABLET | Freq: Every day | ORAL | 1 refills | Status: DC

## 2017-07-28 MED ORDER — ALBUTEROL SULFATE HFA 108 (90 BASE) MCG/ACT IN AERS: 2.0000 | INHALATION_SPRAY | Freq: Four times a day (QID) | RESPIRATORY_TRACT | 1 refills | Status: DC | PRN

## 2017-07-28 NOTE — Progress Notes (Signed)
BP 132/78 (BP Location: Left Arm, Patient Position: Sitting, Cuff Size: Normal)   Pulse 95   Temp (!) 97.3 F (36.3 C) (Other (Comment))   Ht 5\' 4"  (1.626 m)   Wt 182 lb 8 oz (82.8 kg)   LMP 07/11/2017 (Exact Date)   SpO2 99%   BMI 31.33 kg/m    Subjective:    Patient ID: Kristin Carson, female    DOB: Aug 01, 1971, 46 y.o.   MRN: [id number]  HPI: Kristin Carson is a 46 y.o. female presenting on 07/28/2017 for Hypertension and COPD   HPI   Pt states breathing has been good.  She is still smoking  [facility] [place] [facility]  Relevant past medical, surgical, family and social history reviewed and updated as indicated. Interim medical history since our last visit reviewed. Allergies and medications reviewed and updated.   Current Outpatient Medications:  .  albuterol (PROVENTIL HFA;VENTOLIN HFA) 108 (90 Base) MCG/ACT inhaler, Inhale 2 puffs into the lungs every 6 (six) hours as needed for wheezing or shortness of breath., Disp: 1 Inhaler, Rfl: 0 .  albuterol (PROVENTIL) (2.5 MG/3ML) 0.083% nebulizer solution, Take 3 mLs (2.5 mg total) by nebulization every 6 (six) hours as needed for wheezing or shortness of breath., Disp: 75 mL, Rfl: 0 .  lisinopril-hydrochlorothiazide (PRINZIDE,ZESTORETIC) 10-12.5 MG tablet, TAKE 1 TABLET BY MOUTH ONCE DAILY, Disp: 30 tablet, Rfl: 1   Review of Systems  Constitutional: Negative for appetite change, chills, diaphoresis, fatigue, fever and unexpected weight change.  HENT: Negative for congestion, dental problem, drooling, ear pain, facial swelling, hearing loss, mouth sores, sneezing, sore throat, trouble swallowing and voice change.   Eyes: Negative for pain, discharge, redness, itching and visual disturbance.  Respiratory: Negative for cough, choking, shortness of breath and wheezing.   Cardiovascular: Negative for chest pain, palpitations and leg swelling.  Gastrointestinal: Negative for abdominal pain, blood in stool,  constipation, diarrhea and vomiting.  Endocrine: Negative for cold intolerance, heat intolerance and polydipsia.  Genitourinary: Negative for decreased urine volume, dysuria and hematuria.  Musculoskeletal: Negative for arthralgias, back pain and gait problem.  Skin: Negative for rash.  Allergic/Immunologic: Negative for environmental allergies.  Neurological: Negative for seizures, syncope, light-headedness and headaches.  Hematological: Negative for adenopathy.  Psychiatric/Behavioral: Negative for agitation, dysphoric mood and suicidal ideas. The patient is not nervous/anxious.     Per HPI unless specifically indicated above     Objective:    BP 132/78 (BP Location: Left Arm, Patient Position: Sitting, Cuff Size: Normal)   Pulse 95   Temp (!) 97.3 F (36.3 C) (Other (Comment))   Ht 5\' 4"  (1.626 m)   Wt 182 lb 8 oz (82.8 kg)   LMP 07/11/2017 (Exact Date)   SpO2 99%   BMI 31.33 kg/m   Wt Readings from Last 3 Encounters:  07/28/17 182 lb 8 oz (82.8 kg)  07/15/17 181 lb (82.1 kg)  07/12/17 176 lb (79.8 kg)    Physical Exam  Constitutional: She is oriented to person, place, and time. She appears well-developed and well-nourished.  HENT:  Head: Normocephalic and atraumatic.  Neck: Neck supple.  Cardiovascular: Normal rate and regular rhythm.  Pulmonary/Chest: Effort normal and breath sounds normal.  Abdominal: Soft. Bowel sounds are normal. She exhibits no mass. There is no hepatosplenomegaly. There is no tenderness.  Musculoskeletal: She exhibits no edema.  Lymphadenopathy:    She has no cervical adenopathy.  Neurological: She is alert and oriented to  person, place, and time.  Skin: Skin is warm and dry.  Psychiatric: She has a normal mood and affect. Her behavior is normal.  Vitals reviewed.   Results for orders placed or performed during the hospital encounter of 49/17/91  Basic metabolic panel  Result Value Ref Range   Sodium 135 135 - 145 mmol/L   Potassium 3.7  3.5 - 5.1 mmol/L   Chloride 102 101 - 111 mmol/L   CO2 23 22 - 32 mmol/L   Glucose, Bld 84 65 - 99 mg/dL   BUN 15 6 - 20 mg/dL   Creatinine, Ser 1.09 (H) 0.44 - 1.00 mg/dL   Calcium 8.8 (L) 8.9 - 10.3 mg/dL   GFR calc non Af Amer >60 >60 mL/min   GFR calc Af Amer >60 >60 mL/min   Anion gap 10 5 - 15  CBC  Result Value Ref Range   WBC 9.9 4.0 - 10.5 K/uL   RBC 4.50 3.87 - 5.11 MIL/uL   Hemoglobin 13.1 12.0 - 15.0 g/dL   HCT 38.9 36.0 - 46.0 %   MCV 86.4 78.0 - 100.0 fL   MCH 29.1 26.0 - 34.0 pg   MCHC 33.7 30.0 - 36.0 g/dL   RDW 14.5 11.5 - 15.5 %   Platelets 303 150 - 400 K/uL  D-dimer, quantitative (not at [facility])  Result Value Ref Range   D-Dimer, Quant 2.29 (H) 0.00 - 0.50 ug/mL-FEU  Troponin I  Result Value Ref Range   Troponin I <0.03 <0.03 ng/mL  Troponin I  Result Value Ref Range   Troponin I <0.03 <0.03 ng/mL  I-stat troponin, ED  Result Value Ref Range   Troponin i, poc 0.00 0.00 - 0.08 ng/mL   Comment 3              Assessment & Plan:   Encounter Diagnoses  Name Primary?  . Essential hypertension Yes  . Chronic obstructive pulmonary disease, unspecified COPD type ([place])   . Cigarette nicotine dependence with nicotine-induced disorder   . Chronic kidney disease, unspecified CKD stage      -Increase to lisinopril/hctz 20/12.5 -Send meds to [facility]  -counseled smoking cessation -pt to follow up in 6 weeks to recheck blood pressure.  RTO sooner prn

## 2017-09-09 ENCOUNTER — Ambulatory Visit: Payer: Medicaid Other | Admitting: Physician Assistant

## 2017-09-09 ENCOUNTER — Encounter: Payer: Self-pay | Admitting: Physician Assistant

## 2017-09-09 VITALS — BP 141/87 | HR 101 | Temp 97.5°F | Ht 64.0 in | Wt 180.0 lb

## 2017-09-09 DIAGNOSIS — I10 Essential (primary) hypertension: Secondary | ICD-10-CM

## 2017-09-09 NOTE — Progress Notes (Signed)
BP (!) 141/87 (BP Location: Right Arm, Patient Position: Sitting, Cuff Size: Normal)   Pulse (!) 101   Temp (!) 97.5 F (36.4 C) (Other (Comment))   Ht 5\' 4"  (1.626 m)   Wt 180 lb (81.6 kg)   LMP 09/08/2017 (Exact Date)   SpO2 99%   BMI 30.90 kg/m    Subjective:    Patient ID: Kristin Carson, female    DOB: 1971-08-16, 47 y.o.   MRN: [id number]  HPI: Kristin Carson is a 47 y.o. female presenting on 09/09/2017 for Hypertension   HPI   Pt didn't take bp medication yet today.  She is feeling well.   Relevant past medical, surgical, family and social history reviewed and updated as indicated. Interim medical history since our last visit reviewed. Allergies and medications reviewed and updated.   Current Outpatient Medications:  .  albuterol (PROVENTIL HFA;VENTOLIN HFA) 108 (90 Base) MCG/ACT inhaler, Inhale 2 puffs into the lungs every 6 (six) hours as needed for wheezing or shortness of breath., Disp: 3 Inhaler, Rfl: 1 .  albuterol (PROVENTIL) (2.5 MG/3ML) 0.083% nebulizer solution, Take 3 mLs (2.5 mg total) by nebulization every 6 (six) hours as needed for wheezing or shortness of breath., Disp: 75 mL, Rfl: 1 .  lisinopril-hydrochlorothiazide (ZESTORETIC) 20-12.5 MG tablet, Take 1 tablet by mouth daily., Disp: 90 tablet, Rfl: 1   Review of Systems  Constitutional: Negative for appetite change, chills, diaphoresis, fatigue, fever and unexpected weight change.  HENT: Positive for dental problem. Negative for congestion, drooling, ear pain, facial swelling, hearing loss, mouth sores, sneezing, sore throat, trouble swallowing and voice change.   Eyes: Negative for pain, discharge, redness, itching and visual disturbance.  Respiratory: Negative for cough, choking, shortness of breath and wheezing.   Cardiovascular: Negative for chest pain, palpitations and leg swelling.  Gastrointestinal: Negative for abdominal pain, blood in stool, constipation, diarrhea and vomiting.  Endocrine: Negative  for cold intolerance, heat intolerance and polydipsia.  Genitourinary: Negative for decreased urine volume, dysuria and hematuria.  Musculoskeletal: Positive for arthralgias. Negative for back pain and gait problem.  Skin: Negative for rash.  Allergic/Immunologic: Negative for environmental allergies.  Neurological: Negative for seizures, syncope, light-headedness and headaches.  Hematological: Negative for adenopathy.  Psychiatric/Behavioral: Negative for agitation, dysphoric mood and suicidal ideas. The patient is not nervous/anxious.     Per HPI unless specifically indicated above     Objective:    BP (!) 141/87 (BP Location: Right Arm, Patient Position: Sitting, Cuff Size: Normal)   Pulse (!) 101   Temp (!) 97.5 F (36.4 C) (Other (Comment))   Ht 5\' 4"  (1.626 m)   Wt 180 lb (81.6 kg)   LMP 09/08/2017 (Exact Date)   SpO2 99%   BMI 30.90 kg/m   Wt Readings from Last 3 Encounters:  09/09/17 180 lb (81.6 kg)  07/28/17 182 lb 8 oz (82.8 kg)  07/15/17 181 lb (82.1 kg)    Physical Exam  Constitutional: She is oriented to person, place, and time. She appears well-developed and well-nourished.  HENT:  Head: Normocephalic and atraumatic.  Neck: Neck supple.  Cardiovascular: Normal rate and regular rhythm.  Pulmonary/Chest: Effort normal and breath sounds normal.  Abdominal: Soft. Bowel sounds are normal. She exhibits no mass. There is no hepatosplenomegaly. There is no tenderness.  Musculoskeletal: She exhibits no edema.  Lymphadenopathy:    She has no cervical adenopathy.  Neurological: She is alert and oriented to person, place, and time.  Skin: Skin is  warm and dry.  Psychiatric: She has a normal mood and affect. Her behavior is normal.  Vitals reviewed.       Assessment & Plan:    Encounter Diagnosis  Name Primary?  . Essential hypertension Yes    -Check bmp -counseled pt to take her medications regularly -follow up to Recheck bp 1 month.  RTO sooner prn

## 2017-10-08 ENCOUNTER — Ambulatory Visit: Payer: Medicaid Other | Admitting: Physician Assistant

## 2017-10-15 ENCOUNTER — Ambulatory Visit: Payer: Medicaid Other | Admitting: Physician Assistant

## 2017-10-19 ENCOUNTER — Encounter: Payer: Self-pay | Admitting: Physician Assistant

## 2017-11-10 ENCOUNTER — Other Ambulatory Visit: Payer: Self-pay | Admitting: Physician Assistant

## 2017-11-17 ENCOUNTER — Emergency Department (HOSPITAL_COMMUNITY)
Admission: EM | Admit: 2017-11-17 | Discharge: 2017-11-17 | Disposition: A | Discharge status: Home / Self Care | Payer: Self-pay | Attending: Emergency Medicine | Admitting: Emergency Medicine

## 2017-11-17 ENCOUNTER — Other Ambulatory Visit: Payer: Self-pay

## 2017-11-17 ENCOUNTER — Encounter (HOSPITAL_COMMUNITY): Payer: Self-pay | Admitting: Emergency Medicine

## 2017-11-17 DIAGNOSIS — G43919 Migraine, unspecified, intractable, without status migrainosus: Secondary | ICD-10-CM

## 2017-11-17 DIAGNOSIS — J45909 Unspecified asthma, uncomplicated: Secondary | ICD-10-CM | POA: Insufficient documentation

## 2017-11-17 DIAGNOSIS — N189 Chronic kidney disease, unspecified: Secondary | ICD-10-CM | POA: Insufficient documentation

## 2017-11-17 DIAGNOSIS — I129 Hypertensive chronic kidney disease with stage 1 through stage 4 chronic kidney disease, or unspecified chronic kidney disease: Secondary | ICD-10-CM | POA: Insufficient documentation

## 2017-11-17 DIAGNOSIS — Z79899 Other long term (current) drug therapy: Secondary | ICD-10-CM | POA: Insufficient documentation

## 2017-11-17 DIAGNOSIS — G43909 Migraine, unspecified, not intractable, without status migrainosus: Secondary | ICD-10-CM | POA: Insufficient documentation

## 2017-11-17 DIAGNOSIS — F17210 Nicotine dependence, cigarettes, uncomplicated: Secondary | ICD-10-CM | POA: Insufficient documentation

## 2017-11-17 DIAGNOSIS — I10 Essential (primary) hypertension: Secondary | ICD-10-CM

## 2017-11-17 LAB — BASIC METABOLIC PANEL
ANION GAP: 9 (ref 5–15)
BUN: 16 mg/dL (ref 6–20)
CALCIUM: 8.3 mg/dL — AB (ref 8.9–10.3)
CO2: 21 mmol/L — ABNORMAL LOW (ref 22–32)
Chloride: 104 mmol/L (ref 101–111)
Creatinine, Ser: 0.91 mg/dL (ref 0.44–1.00)
GFR calc Af Amer: >60 mL/min (ref >60)
GLUCOSE: 96 mg/dL (ref 65–99)
POTASSIUM: 3.9 mmol/L (ref 3.5–5.1)
SODIUM: 134 mmol/L — AB (ref 135–145)

## 2017-11-17 LAB — CBC
HCT: 35.5 % — ABNORMAL LOW (ref 36.0–46.0)
Hemoglobin: 11.4 g/dL — ABNORMAL LOW (ref 12.0–15.0)
MCH: 28.6 pg (ref 26.0–34.0)
MCHC: 32.1 g/dL (ref 30.0–36.0)
MCV: 89.2 fL (ref 78.0–100.0)
PLATELETS: 294 10*3/uL (ref 150–400)
RBC: 3.98 MIL/uL (ref 3.87–5.11)
RDW: 14.3 % (ref 11.5–15.5)
WBC: 7.8 10*3/uL (ref 4.0–10.5)

## 2017-11-17 MED ORDER — SODIUM CHLORIDE 0.9 % IV SOLN: INTRAVENOUS | Status: DC

## 2017-11-17 MED ORDER — DEXAMETHASONE SODIUM PHOSPHATE 4 MG/ML IJ SOLN
10.0000 mg | Freq: Once | INTRAMUSCULAR | Status: AC
  Administered 2017-11-17: 10 mg via INTRAVENOUS

## 2017-11-17 MED ORDER — LISINOPRIL-HYDROCHLOROTHIAZIDE 20-12.5 MG PO TABS: 1.0000 | ORAL_TABLET | Freq: Every day | ORAL | 2 refills | Status: DC

## 2017-11-17 MED ORDER — SODIUM CHLORIDE 0.9 % IV BOLUS (SEPSIS)
1000.0000 mL | Freq: Once | INTRAVENOUS | Status: AC
  Administered 2017-11-17: 1000 mL via INTRAVENOUS

## 2017-11-17 MED ORDER — METOCLOPRAMIDE HCL 5 MG/ML IJ SOLN
10.0000 mg | Freq: Once | INTRAMUSCULAR | Status: AC
  Administered 2017-11-17: 10 mg via INTRAVENOUS

## 2017-11-17 MED ORDER — DIPHENHYDRAMINE HCL 50 MG/ML IJ SOLN
50.0000 mg | Freq: Once | INTRAMUSCULAR | Status: AC
  Administered 2017-11-17: 50 mg via INTRAVENOUS

## 2017-11-17 NOTE — ED Provider Notes (Signed)
[facility] EMERGENCY DEPARTMENT Provider Note   CSN: [id number] Arrival date & time: 11/17/17  7902     History   Chief Complaint Chief Complaint  Patient presents with  . Migraine    HPI Kristin Carson is a 47 y.o. female.  Patient with complaint of headache that started yesterday.  Patient also is out of her blood pressure medicine.  Patient states she has a history of migraines.  Last one was about a year ago.  Used to be more frequent when she was young.  Headache is bilateral forehead area with photophobia typical for her migraine type headaches.  No nausea no vomiting.  No neck stiffness.  No fevers.  Patient states that she has had some numbness to her left leg this happens intermittently when she sleeps on it wrong.  No weakness.  No speech problems.      Past Medical History:  Diagnosis Date  . Anemia   . Asthma   . Bronchitis   . Depression   . GERD (gastroesophageal reflux disease)   . Headache   . Hypertension     Patient Active Problem List   Diagnosis Date Noted  . Essential hypertension 12/10/2016  . Chronic obstructive pulmonary disease ([place]) 12/10/2016  . Cigarette nicotine dependence with nicotine-induced disorder 12/10/2016  . Chronic kidney disease 12/10/2016  . SOB (shortness of breath) 04/07/2013  . Migraine 04/07/2013    Past Surgical History:  Procedure Laterality Date  . BREAST SURGERY    . CESAREAN SECTION    . LEG SURGERY      OB History    Gravida Para Term Preterm AB Living   1 1 1          SAB TAB Ectopic Multiple Live Births                   Home Medications    Prior to Admission medications   Medication Sig Start Date End Date Taking? Authorizing Provider  acetaminophen (TYLENOL) 325 MG tablet Take 650 mg by mouth every 6 (six) hours as needed for headache.   Yes [provider]  albuterol (PROVENTIL HFA;VENTOLIN HFA) 108 (90 Base) MCG/ACT inhaler Inhale 2 puffs into the lungs every 6 (six) hours as needed for  wheezing or shortness of breath. 07/28/17  Yes Soyla Dryer, PA-C  albuterol (PROVENTIL) (2.5 MG/3ML) 0.083% nebulizer solution Take 3 mLs (2.5 mg total) by nebulization every 6 (six) hours as needed for wheezing or shortness of breath. 07/28/17  Yes Soyla Dryer, PA-C  lisinopril-hydrochlorothiazide (ZESTORETIC) 20-12.5 MG tablet Take 1 tablet by mouth daily. 07/28/17  Yes Soyla Dryer, PA-C  lisinopril-hydrochlorothiazide (ZESTORETIC) 20-12.5 MG tablet Take 1 tablet by mouth daily. 11/17/17   Fredia Sorrow, MD    Family History Family History  Problem Relation Age of Onset  . Dermatomyositis Mother   . Hypertension Mother   . Cancer Mother        lung  . Cancer Father        lung  . Heart disease Father     Social History Social History   Tobacco Use  . Smoking status: Current Every Day Smoker    Packs/day: 1.00    Years: 23.00    Pack years: 23.00    Types: Cigarettes  . Smokeless tobacco: Never Used  Substance Use Topics  . Alcohol use: Yes    Comment: 1 beer daily   . Drug use: Yes    Types: Marijuana    Comment:  marijuana yesterday      Allergies   Ibuprofen; Tramadol; and Erythromycin   Review of Systems Review of Systems  Constitutional: Negative for fever.  HENT: Negative for congestion.   Eyes: Positive for photophobia.  Respiratory: Positive for cough.   Cardiovascular: Negative for chest pain.  Gastrointestinal: Negative for abdominal pain, nausea and vomiting.  Musculoskeletal: Negative for myalgias and neck stiffness.  Neurological: Positive for numbness and headaches.  Hematological: Does not bruise/bleed easily.  Psychiatric/Behavioral: Negative for confusion.     Physical Exam Updated Vital Signs BP (!) 162/85 (BP Location: Right Arm)   Pulse 80   Temp (!) 97.5 F (36.4 C) (Oral)   Resp 19   Ht 1.702 m (5\' 7" )   Wt 77.1 kg (170 lb)   SpO2 100%   BMI 26.63 kg/m   Physical Exam  Constitutional: She is oriented to  person, place, and time. She appears well-developed and well-nourished. No distress.  HENT:  Head: Normocephalic and atraumatic.  Mouth/Throat: Oropharynx is clear and moist.  Eyes: EOM are normal. Pupils are equal, round, and reactive to light.  Neck: Normal range of motion. Neck supple.  Cardiovascular: Normal rate, regular rhythm and normal heart sounds.  Pulmonary/Chest: Effort normal and breath sounds normal.  Abdominal: Soft. Bowel sounds are normal. There is no tenderness.  Musculoskeletal: Normal range of motion.  Neurological: She is alert and oriented to person, place, and time. No cranial nerve deficit or sensory deficit. She exhibits normal muscle tone. Coordination normal.  Skin: Skin is warm.  Nursing note and vitals reviewed.    ED Treatments / Results  Labs (all labs ordered are listed, but only abnormal results are displayed) Labs Reviewed  CBC - Abnormal; Notable for the following components:      Result Value   Hemoglobin 11.4 (*)    HCT 35.5 (*)    All other components within normal limits  BASIC METABOLIC PANEL - Abnormal; Notable for the following components:   Sodium 134 (*)    CO2 21 (*)    Calcium 8.3 (*)    All other components within normal limits    EKG  EKG Interpretation None       Radiology No results found.  Procedures Procedures (including critical care time)  Medications Ordered in ED Medications  0.9 %  sodium chloride infusion (not administered)  sodium chloride 0.9 % bolus 1,000 mL (0 mLs Intravenous Stopped 11/17/17 0912)  dexamethasone (DECADRON) injection 10 mg (10 mg Intravenous Given 11/17/17 0741)  diphenhydrAMINE (BENADRYL) injection 50 mg (50 mg Intravenous Given 11/17/17 0741)  metoCLOPramide (REGLAN) injection 10 mg (10 mg Intravenous Given 11/17/17 0740)     Initial Impression / Assessment and Plan / ED Course  I have reviewed the triage vital signs and the nursing notes.  Pertinent labs & imaging results that were  available during my care of the patient were reviewed by me and considered in my medical decision making (see chart for details).    Patient with history of migraines.  Headache was typical for her with forehead pain and some photophobia.  Started yesterday.  Patient also with a history of hypertension and has been out of her meds.    Labs here today without any acute findings and labs reasonable for restarting her hypertensive medicine which is hydrochlorothiazide and lisinopril combination.  Patient headache improved significantly with migraine cocktail Decadron Benadryl and Reglan.  Patient feels ready to go home.  Do not feel that there is any  evidence of a subarachnoid hemorrhage or acute head bleed no significant neuro deficits.  Patient did talk about some numbness to her left leg area which happens when she sleeps on it wrong.   Final Clinical Impressions(s) / ED Diagnoses   Final diagnoses:  Intractable migraine without status migrainosus, unspecified migraine type  Essential hypertension    ED Discharge Orders        Ordered    lisinopril-hydrochlorothiazide (ZESTORETIC) 20-12.5 MG tablet  Daily     11/17/17 0914       Fredia Sorrow, MD 11/17/17 [phone]

## 2017-11-17 NOTE — Discharge Instructions (Signed)
Blood pressure medicine renewed.  Rest today would expect headache to resolve by tomorrow.  Return for any new or worse symptoms.  Make an appointment with health department follow-up with your blood pressure.

## 2017-11-17 NOTE — ED Triage Notes (Signed)
Pt c/o of migraine x2 days. Eyes sensitive to light, pt has been without her BP meds for 4 days. BP 162/85

## 2017-12-11 ENCOUNTER — Other Ambulatory Visit: Payer: Self-pay

## 2017-12-11 ENCOUNTER — Emergency Department (HOSPITAL_COMMUNITY)
Admission: EM | Admit: 2017-12-11 | Discharge: 2017-12-11 | Disposition: A | Discharge status: Home / Self Care | Payer: Medicaid Other | Attending: Emergency Medicine | Admitting: Emergency Medicine

## 2017-12-11 ENCOUNTER — Encounter (HOSPITAL_COMMUNITY): Payer: Self-pay | Admitting: Emergency Medicine

## 2017-12-11 DIAGNOSIS — R600 Localized edema: Secondary | ICD-10-CM | POA: Insufficient documentation

## 2017-12-11 DIAGNOSIS — F17210 Nicotine dependence, cigarettes, uncomplicated: Secondary | ICD-10-CM | POA: Insufficient documentation

## 2017-12-11 DIAGNOSIS — I129 Hypertensive chronic kidney disease with stage 1 through stage 4 chronic kidney disease, or unspecified chronic kidney disease: Secondary | ICD-10-CM | POA: Insufficient documentation

## 2017-12-11 DIAGNOSIS — J449 Chronic obstructive pulmonary disease, unspecified: Secondary | ICD-10-CM | POA: Insufficient documentation

## 2017-12-11 DIAGNOSIS — N189 Chronic kidney disease, unspecified: Secondary | ICD-10-CM | POA: Insufficient documentation

## 2017-12-11 DIAGNOSIS — T7840XA Allergy, unspecified, initial encounter: Secondary | ICD-10-CM | POA: Insufficient documentation

## 2017-12-11 DIAGNOSIS — Z79899 Other long term (current) drug therapy: Secondary | ICD-10-CM | POA: Insufficient documentation

## 2017-12-11 MED ORDER — FAMOTIDINE IN NACL 20-0.9 MG/50ML-% IV SOLN
20.0000 mg | Freq: Once | INTRAVENOUS | Status: AC
  Administered 2017-12-11: 20 mg via INTRAVENOUS
  Filled 2017-12-11: qty 50

## 2017-12-11 MED ORDER — FAMOTIDINE 20 MG PO TABS: 20.0000 mg | ORAL_TABLET | Freq: Two times a day (BID) | ORAL | 0 refills | Status: DC

## 2017-12-11 MED ORDER — PREDNISONE 20 MG PO TABS: ORAL_TABLET | ORAL | 0 refills | Status: DC

## 2017-12-11 MED ORDER — METHYLPREDNISOLONE SODIUM SUCC 125 MG IJ SOLR
125.0000 mg | Freq: Once | INTRAMUSCULAR | Status: AC
  Administered 2017-12-11: 125 mg via INTRAVENOUS
  Filled 2017-12-11: qty 2

## 2017-12-11 MED ORDER — HYDROCHLOROTHIAZIDE 25 MG PO TABS: 25.0000 mg | ORAL_TABLET | Freq: Every day | ORAL | 1 refills | Status: DC

## 2017-12-11 NOTE — ED Triage Notes (Signed)
Pt states she has been having allergic reactions for about 6 mos that will flare up, she has seen PCP, pt woke up this am itching on her face and arms, took 2 Benadryl, top lip still swollen at this time, no swelling to inside of mouth, no SOB or acute distress

## 2017-12-11 NOTE — ED Provider Notes (Signed)
[facility] EMERGENCY DEPARTMENT Provider Note   CSN: [id number] Arrival date & time: 12/11/17  1147     History   Chief Complaint Chief Complaint  Patient presents with  . Allergic Reaction    HPI Kristin Carson is a 46 y.o. female.  Patient complains of welts to her arms and chest.  Also swelling to her upper lip.  Patient has taken some Benadryl and the welts seem to have improved  The history is provided by the patient. No language interpreter was used.  Allergic Reaction  Presenting symptoms: rash   Presenting symptoms: no difficulty breathing   Severity:  Moderate Prior allergic episodes:  Unable to specify Context: not animal exposure   Relieved by:  Antihistamines Worsened by:  Nothing Ineffective treatments:  Antihistamines   Past Medical History:  Diagnosis Date  . Anemia   . Asthma   . Bronchitis   . Depression   . GERD (gastroesophageal reflux disease)   . Headache   . Hypertension     Patient Active Problem List   Diagnosis Date Noted  . Essential hypertension 12/10/2016  . Chronic obstructive pulmonary disease ([place]) 12/10/2016  . Cigarette nicotine dependence with nicotine-induced disorder 12/10/2016  . Chronic kidney disease 12/10/2016  . SOB (shortness of breath) 04/07/2013  . Migraine 04/07/2013    Past Surgical History:  Procedure Laterality Date  . BREAST SURGERY    . CESAREAN SECTION    . LEG SURGERY       OB History    Gravida  1   Para  1   Term  1   Preterm      AB      Living        SAB      TAB      Ectopic      Multiple      Live Births               Home Medications    Prior to Admission medications   Medication Sig Start Date End Date Taking? Authorizing Provider  albuterol (PROVENTIL HFA;VENTOLIN HFA) 108 (90 Base) MCG/ACT inhaler Inhale 2 puffs into the lungs every 6 (six) hours as needed for wheezing or shortness of breath. 07/28/17  Yes Soyla Dryer, PA-C  albuterol (PROVENTIL) (2.5  MG/3ML) 0.083% nebulizer solution Take 3 mLs (2.5 mg total) by nebulization every 6 (six) hours as needed for wheezing or shortness of breath. 07/28/17  Yes Soyla Dryer, PA-C  diphenhydrAMINE (BENADRYL) 25 MG tablet Take 50 mg by mouth every 6 (six) hours as needed for itching or allergies.   Yes [provider]  lisinopril-hydrochlorothiazide (ZESTORETIC) 20-12.5 MG tablet Take 1 tablet by mouth daily. 07/28/17  Yes Soyla Dryer, PA-C  famotidine (PEPCID) 20 MG tablet Take 1 tablet (20 mg total) by mouth 2 (two) times daily. 12/11/17   Milton Ferguson, MD  hydrochlorothiazide (HYDRODIURIL) 25 MG tablet Take 1 tablet (25 mg total) by mouth daily. 12/11/17   Milton Ferguson, MD  predniSONE (DELTASONE) 20 MG tablet 2 tabs po daily x 3 days 12/11/17   Milton Ferguson, MD    Family History Family History  Problem Relation Age of Onset  . Dermatomyositis Mother   . Hypertension Mother   . Cancer Mother        lung  . Cancer Father        lung  . Heart disease Father     Social History Social History   Tobacco Use  .  Smoking status: Current Every Day Smoker    Packs/day: 1.00    Years: 23.00    Pack years: 23.00    Types: Cigarettes  . Smokeless tobacco: Never Used  Substance Use Topics  . Alcohol use: Yes    Comment: 1 beer daily   . Drug use: Yes    Types: Marijuana    Comment: marijuana yesterday      Allergies   Lisinopril-hydrochlorothiazide; Ibuprofen; Tramadol; and Erythromycin   Review of Systems Review of Systems  Constitutional: Negative for appetite change and fatigue.  HENT: Negative for congestion, ear discharge and sinus pressure.        Swelling to upper lip  Eyes: Negative for discharge.  Respiratory: Negative for cough.   Cardiovascular: Negative for chest pain.  Gastrointestinal: Negative for abdominal pain and diarrhea.  Genitourinary: Negative for frequency and hematuria.  Musculoskeletal: Negative for back pain.  Skin: Positive for  rash.  Neurological: Negative for seizures and headaches.  Psychiatric/Behavioral: Negative for hallucinations.     Physical Exam Updated Vital Signs BP 114/73 (BP Location: Left Arm)   Pulse 69   Temp 99 F (37.2 C) (Oral)   Resp 16   Ht 5\' 7"  (1.702 m)   Wt 77.1 kg (170 lb)   SpO2 100%   BMI 26.63 kg/m   Physical Exam  Constitutional: She is oriented to person, place, and time. She appears well-developed.  HENT:  Head: Normocephalic.  Moderate swelling to upper lip  Eyes: Conjunctivae and EOM are normal. No scleral icterus.  Neck: Neck supple. No thyromegaly present.  Cardiovascular: Normal rate and regular rhythm. Exam reveals no gallop and no friction rub.  No murmur heard. Pulmonary/Chest: No stridor. She has no wheezes. She has no rales. She exhibits no tenderness.  Abdominal: She exhibits no distension. There is no tenderness. There is no rebound.  Musculoskeletal: Normal range of motion. She exhibits no edema.  Lymphadenopathy:    She has no cervical adenopathy.  Neurological: She is oriented to person, place, and time. She exhibits normal muscle tone. Coordination normal.  Skin: No rash noted. No erythema.  Psychiatric: She has a normal mood and affect. Her behavior is normal.     ED Treatments / Results  Labs (all labs ordered are listed, but only abnormal results are displayed) Labs Reviewed - No data to display  EKG None  Radiology No results found.  Procedures Procedures (including critical care time)  Medications Ordered in ED Medications  methylPREDNISolone sodium succinate (SOLU-MEDROL) 125 mg/2 mL injection 125 mg (125 mg Intravenous Given 12/11/17 1345)  famotidine (PEPCID) IVPB 20 mg premix (0 mg Intravenous Stopped 12/11/17 1432)     Initial Impression / Assessment and Plan / ED Course  I have reviewed the triage vital signs and the nursing notes.  Pertinent labs & imaging results that were available during my care of the patient were  reviewed by me and considered in my medical decision making (see chart for details).    Patient with an allergic reaction.  Symptoms seem to have improved some since treatment.  It is possible this is related to blood pressure medicine.  I am going to stop her lisinopril and just put her on the hydrochlorothiazide.  She will follow-up with her provider  Final Clinical Impressions(s) / ED Diagnoses   Final diagnoses:  Allergic reaction, initial encounter    ED Discharge Orders        Ordered    hydrochlorothiazide (HYDRODIURIL) 25 MG tablet  Daily     12/11/17 1504    predniSONE (DELTASONE) 20 MG tablet     12/11/17 1504    famotidine (PEPCID) 20 MG tablet  2 times daily     12/11/17 1504       Milton Ferguson, MD 12/11/17 1510

## 2017-12-11 NOTE — Discharge Instructions (Addendum)
Take Benadryl for any swelling and itching.  Stop taking your blood pressure pill.  I have written for another blood pressure medicine for you.  Follow-up with your provider next week

## 2017-12-17 ENCOUNTER — Other Ambulatory Visit: Payer: Self-pay

## 2017-12-17 DIAGNOSIS — I129 Hypertensive chronic kidney disease with stage 1 through stage 4 chronic kidney disease, or unspecified chronic kidney disease: Secondary | ICD-10-CM | POA: Insufficient documentation

## 2017-12-17 DIAGNOSIS — Z79899 Other long term (current) drug therapy: Secondary | ICD-10-CM | POA: Insufficient documentation

## 2017-12-17 DIAGNOSIS — F17210 Nicotine dependence, cigarettes, uncomplicated: Secondary | ICD-10-CM | POA: Insufficient documentation

## 2017-12-17 DIAGNOSIS — J45909 Unspecified asthma, uncomplicated: Secondary | ICD-10-CM | POA: Insufficient documentation

## 2017-12-17 DIAGNOSIS — N189 Chronic kidney disease, unspecified: Secondary | ICD-10-CM | POA: Insufficient documentation

## 2017-12-17 DIAGNOSIS — T7840XA Allergy, unspecified, initial encounter: Secondary | ICD-10-CM | POA: Insufficient documentation

## 2017-12-17 NOTE — ED Triage Notes (Signed)
Pt states she took her bp med tonight and now feels like she is having diff swallowing.

## 2017-12-18 ENCOUNTER — Emergency Department (HOSPITAL_COMMUNITY)
Admission: EM | Admit: 2017-12-18 | Discharge: 2017-12-18 | Disposition: A | Discharge status: Home / Self Care | Payer: Self-pay | Attending: Emergency Medicine | Admitting: Emergency Medicine

## 2017-12-18 DIAGNOSIS — T7840XA Allergy, unspecified, initial encounter: Secondary | ICD-10-CM

## 2017-12-18 MED ORDER — FAMOTIDINE 20 MG PO TABS: 20.0000 mg | ORAL_TABLET | Freq: Two times a day (BID) | ORAL | 0 refills | Status: DC

## 2017-12-18 MED ORDER — AMLODIPINE BESYLATE 10 MG PO TABS: 10.0000 mg | ORAL_TABLET | Freq: Every day | ORAL | 0 refills | Status: DC

## 2017-12-18 MED ORDER — PREDNISONE 20 MG PO TABS: ORAL_TABLET | ORAL | 0 refills | Status: DC

## 2017-12-18 MED ORDER — DIPHENHYDRAMINE HCL 50 MG/ML IJ SOLN
25.0000 mg | Freq: Once | INTRAMUSCULAR | Status: AC
  Administered 2017-12-18: 25 mg via INTRAVENOUS
  Filled 2017-12-18: qty 1

## 2017-12-18 MED ORDER — DEXAMETHASONE SODIUM PHOSPHATE 10 MG/ML IJ SOLN
10.0000 mg | Freq: Once | INTRAMUSCULAR | Status: AC
  Administered 2017-12-18: 10 mg via INTRAVENOUS
  Filled 2017-12-18: qty 1

## 2017-12-18 MED ORDER — FAMOTIDINE IN NACL 20-0.9 MG/50ML-% IV SOLN
20.0000 mg | Freq: Once | INTRAVENOUS | Status: AC
  Administered 2017-12-18: 20 mg via INTRAVENOUS
  Filled 2017-12-18: qty 50

## 2017-12-18 NOTE — Discharge Instructions (Addendum)
Stop the new blood pressure pill, hydrochlorothiazide and avoid it in the future. Start the norvasc for your blood pressure. You will need to have your doctor recheck you in about 2 weeks to see if it is controlling your blood pressure. Take the prednisone and pepcid until gone. Take the OTC benadryl 25-50 mg every 6 hrs as needed for difficulty swallowing.  Return to the ED if you get worse again.

## 2017-12-18 NOTE — ED Provider Notes (Signed)
[facility] EMERGENCY DEPARTMENT Provider Note   CSN: [id number] Arrival date & time: 12/17/17  2340  Time seen 12:44 AM   History   Chief Complaint Chief Complaint  Patient presents with  . Dysphagia    HPI Kristin Carson is a 47 y.o. female.  HPI patient states about 9 PM tonight she took her first dose of hydrochlorothiazide that she got filled today.  She denies any difficulty swallowing the pill.  However about an hour later she felt like she had something stuck in her throat.  She states she is unable to eat or drink because it will not go down.  She denies any difficulty breathing or swelling in her mouth or lips.  She denies any itching of her body or rash. She states her voice was hoarse earlier.  She denies nausea or vomiting but did have one episode of diarrhea.  She states she took one Benadryl over-the-counter about 10 PM without improvement.  Patient was seen on April 12 for allergic reactions and was felt to be from her ACE inhibitor.  PCP [facility] in [place]   Past Medical History:  Diagnosis Date  . Anemia   . Asthma   . Bronchitis   . Depression   . GERD (gastroesophageal reflux disease)   . Headache   . Hypertension     Patient Active Problem List   Diagnosis Date Noted  . Essential hypertension 12/10/2016  . Chronic obstructive pulmonary disease ([place]) 12/10/2016  . Cigarette nicotine dependence with nicotine-induced disorder 12/10/2016  . Chronic kidney disease 12/10/2016  . SOB (shortness of breath) 04/07/2013  . Migraine 04/07/2013    Past Surgical History:  Procedure Laterality Date  . BREAST SURGERY    . CESAREAN SECTION    . LEG SURGERY       OB History    Gravida  1   Para  1   Term  1   Preterm      AB      Living        SAB      TAB      Ectopic      Multiple      Live Births               Home Medications    Prior to Admission medications   Medication Sig Start Date End Date Taking? Authorizing  Provider  albuterol (PROVENTIL HFA;VENTOLIN HFA) 108 (90 Base) MCG/ACT inhaler Inhale 2 puffs into the lungs every 6 (six) hours as needed for wheezing or shortness of breath. 07/28/17   Soyla Dryer, PA-C  albuterol (PROVENTIL) (2.5 MG/3ML) 0.083% nebulizer solution Take 3 mLs (2.5 mg total) by nebulization every 6 (six) hours as needed for wheezing or shortness of breath. 07/28/17   Soyla Dryer, PA-C  amLODipine (NORVASC) 10 MG tablet Take 1 tablet (10 mg total) by mouth daily. 12/18/17   Rolland Porter, MD  diphenhydrAMINE (BENADRYL) 25 MG tablet Take 50 mg by mouth every 6 (six) hours as needed for itching or allergies.    [provider]  famotidine (PEPCID) 20 MG tablet Take 1 tablet (20 mg total) by mouth 2 (two) times daily. 12/11/17   Milton Ferguson, MD  famotidine (PEPCID) 20 MG tablet Take 1 tablet (20 mg total) by mouth 2 (two) times daily. 12/18/17   Rolland Porter, MD  hydrochlorothiazide (HYDRODIURIL) 25 MG tablet Take 1 tablet (25 mg total) by mouth daily. 12/11/17   Milton Ferguson, MD  lisinopril-hydrochlorothiazide (ZESTORETIC) 20-12.5 MG tablet Take 1 tablet by mouth daily. 07/28/17   Soyla Dryer, PA-C  predniSONE (DELTASONE) 20 MG tablet 2 tabs po daily x 3 days 12/11/17   Milton Ferguson, MD  predniSONE (DELTASONE) 20 MG tablet Take 3 po QD x 3d , then 2 po QD x 3d then 1 po QD x 3d 12/18/17   Rolland Porter, MD    Family History Family History  Problem Relation Age of Onset  . Dermatomyositis Mother   . Hypertension Mother   . Cancer Mother        lung  . Cancer Father        lung  . Heart disease Father     Social History Social History   Tobacco Use  . Smoking status: Current Every Day Smoker    Packs/day: 1.00    Years: 23.00    Pack years: 23.00    Types: Cigarettes  . Smokeless tobacco: Never Used  Substance Use Topics  . Alcohol use: Yes    Comment: 1 beer daily   . Drug use: Yes    Types: Marijuana    Comment: marijuana yesterday       Allergies   Lisinopril-hydrochlorothiazide; Ibuprofen; Tramadol; and Erythromycin   Review of Systems Review of Systems  All other systems reviewed and are negative.    Physical Exam Updated Vital Signs BP (!) 159/89 (BP Location: Right Arm)   Pulse 77   Temp 98.8 F (37.1 C) (Oral)   Resp 16   Ht 5\' 4"  (1.626 m)   Wt 77.1 kg (170 lb)   LMP 12/07/2017 (Approximate)   SpO2 100%   BMI 29.18 kg/m   Vital signs normal except hypertension   Physical Exam  Constitutional: She is oriented to person, place, and time. She appears well-developed and well-nourished.  Non-toxic appearance. She does not appear ill. No distress.  HENT:  Head: Normocephalic and atraumatic.  Right Ear: External ear normal.  Left Ear: External ear normal.  Nose: Nose normal. No mucosal edema or rhinorrhea.  Mouth/Throat: Oropharynx is clear and moist and mucous membranes are normal. No dental abscesses or uvula swelling.  Patient is not drooling, her voice may be slightly hoarse.  There is no swelling of her lips, tongue, or oropharynx including the posterior oropharynx.  Eyes: Pupils are equal, round, and reactive to light. Conjunctivae and EOM are normal.  Neck: Normal range of motion and full passive range of motion without pain. Neck supple.  Cardiovascular: Normal rate, regular rhythm and normal heart sounds. Exam reveals no gallop and no friction rub.  No murmur heard. Pulmonary/Chest: Effort normal and breath sounds normal. No respiratory distress. She has no wheezes. She has no rhonchi. She has no rales. She exhibits no tenderness and no crepitus.  Abdominal: Normal appearance.  Musculoskeletal: Normal range of motion. She exhibits no edema or tenderness.  Moves all extremities well.   Neurological: She is alert and oriented to person, place, and time. She has normal strength. No cranial nerve deficit.  Skin: Skin is warm, dry and intact. No rash noted. No erythema. No pallor.   Psychiatric: Her speech is normal and behavior is normal. Thought content normal. Her mood appears anxious.  Nursing note and vitals reviewed.    ED Treatments / Results  Labs (all labs ordered are listed, but only abnormal results are displayed) Labs Reviewed - No data to display  EKG None  Radiology No results found.  Procedures Procedures (including critical care  time)  Medications Ordered in ED Medications  dexamethasone (DECADRON) injection 10 mg (10 mg Intravenous Given 12/18/17 0103)  famotidine (PEPCID) IVPB 20 mg premix (0 mg Intravenous Stopped 12/18/17 0133)  diphenhydrAMINE (BENADRYL) injection 25 mg (25 mg Intravenous Given 12/18/17 0103)     Initial Impression / Assessment and Plan / ED Course  I have reviewed the triage vital signs and the nursing notes.  Pertinent labs & imaging results that were available during my care of the patient were reviewed by me and considered in my medical decision making (see chart for details).     It is very unusual to see allergic reaction to hydrochlorothiazide, patient states her symptoms did not start for an hour after she took the pill so I do not think she has a pill stuck in her esophagus.  She was given the allergic reaction medications to see how she would do.  Recheck at 2:13 AM patient states she is feeling better.  She states she feels like she is swallowing better, she just has a mild sensation that something is there now.  I had the nursing staff give her some fluids to drink to make sure she was able to swallow now.  Patient was advised to stop the hydrochlorothiazide, interestingly her reactions in the past were a combination of lisinopril and HCTZ.  She was placed on Norvasc, I cannot tell but she may have been on it before.  [facility] to see how her blood pressure is doing.  Final Clinical Impressions(s) / ED Diagnoses   Final diagnoses:  Allergic reaction, initial  encounter    ED Discharge Orders        Ordered    amLODipine (NORVASC) 10 MG tablet  Daily     12/18/17 0242    predniSONE (DELTASONE) 20 MG tablet     12/18/17 0243    famotidine (PEPCID) 20 MG tablet  2 times daily     12/18/17 0243    OTC benadryl  Plan discharge  Rolland Porter, MD, Barbette Or, MD 12/18/17 [phone]

## 2017-12-23 ENCOUNTER — Emergency Department (HOSPITAL_COMMUNITY): Payer: Self-pay

## 2017-12-23 ENCOUNTER — Emergency Department (HOSPITAL_COMMUNITY)
Admission: EM | Admit: 2017-12-23 | Discharge: 2017-12-23 | Disposition: A | Discharge status: Home / Self Care | Payer: Self-pay | Attending: Emergency Medicine | Admitting: Emergency Medicine

## 2017-12-23 ENCOUNTER — Encounter (HOSPITAL_COMMUNITY): Payer: Self-pay | Admitting: Emergency Medicine

## 2017-12-23 ENCOUNTER — Other Ambulatory Visit: Payer: Self-pay

## 2017-12-23 DIAGNOSIS — I129 Hypertensive chronic kidney disease with stage 1 through stage 4 chronic kidney disease, or unspecified chronic kidney disease: Secondary | ICD-10-CM | POA: Insufficient documentation

## 2017-12-23 DIAGNOSIS — Z79899 Other long term (current) drug therapy: Secondary | ICD-10-CM | POA: Insufficient documentation

## 2017-12-23 DIAGNOSIS — J45909 Unspecified asthma, uncomplicated: Secondary | ICD-10-CM | POA: Insufficient documentation

## 2017-12-23 DIAGNOSIS — N189 Chronic kidney disease, unspecified: Secondary | ICD-10-CM | POA: Insufficient documentation

## 2017-12-23 DIAGNOSIS — F329 Major depressive disorder, single episode, unspecified: Secondary | ICD-10-CM | POA: Insufficient documentation

## 2017-12-23 DIAGNOSIS — F17210 Nicotine dependence, cigarettes, uncomplicated: Secondary | ICD-10-CM | POA: Insufficient documentation

## 2017-12-23 DIAGNOSIS — R42 Dizziness and giddiness: Secondary | ICD-10-CM

## 2017-12-23 LAB — CBC WITH DIFFERENTIAL/PLATELET
BASOS ABS: 0 10*3/uL (ref 0.0–0.1)
BASOS PCT: 0 %
EOS ABS: 0.1 10*3/uL (ref 0.0–0.7)
EOS PCT: 1 %
HCT: 37.6 % (ref 36.0–46.0)
Hemoglobin: 12.1 g/dL (ref 12.0–15.0)
Lymphocytes Relative: 26 %
Lymphs Abs: 2.3 10*3/uL (ref 0.7–4.0)
MCH: 28.4 pg (ref 26.0–34.0)
MCHC: 32.2 g/dL (ref 30.0–36.0)
MCV: 88.3 fL (ref 78.0–100.0)
Monocytes Absolute: 0.9 10*3/uL (ref 0.1–1.0)
Monocytes Relative: 10 %
Neutro Abs: 5.7 10*3/uL (ref 1.7–7.7)
Neutrophils Relative %: 63 %
PLATELETS: 291 10*3/uL (ref 150–400)
RBC: 4.26 MIL/uL (ref 3.87–5.11)
RDW: 13.8 % (ref 11.5–15.5)
WBC: 9 10*3/uL (ref 4.0–10.5)

## 2017-12-23 LAB — COMPREHENSIVE METABOLIC PANEL
ALK PHOS: 39 U/L (ref 38–126)
ALT: 20 U/L (ref 14–54)
AST: 27 U/L (ref 15–41)
Albumin: 3.7 g/dL (ref 3.5–5.0)
Anion gap: 9 (ref 5–15)
BILIRUBIN TOTAL: 0.7 mg/dL (ref 0.3–1.2)
BUN: 12 mg/dL (ref 6–20)
CALCIUM: 8.7 mg/dL — AB (ref 8.9–10.3)
CHLORIDE: 104 mmol/L (ref 101–111)
CO2: 22 mmol/L (ref 22–32)
CREATININE: 0.95 mg/dL (ref 0.44–1.00)
Glucose, Bld: 90 mg/dL (ref 65–99)
Potassium: 4.1 mmol/L (ref 3.5–5.1)
Sodium: 135 mmol/L (ref 135–145)
TOTAL PROTEIN: 7.1 g/dL (ref 6.5–8.1)

## 2017-12-23 LAB — LIPASE, BLOOD: LIPASE: 22 U/L (ref 11–51)

## 2017-12-23 LAB — I-STAT TROPONIN, ED: TROPONIN I, POC: 0 ng/mL (ref 0.00–0.08)

## 2017-12-23 MED ORDER — AMLODIPINE BESYLATE 5 MG PO TABS
10.0000 mg | ORAL_TABLET | Freq: Once | ORAL | Status: AC
  Administered 2017-12-23: 10 mg via ORAL
  Filled 2017-12-23: qty 2

## 2017-12-23 MED ORDER — LORAZEPAM 1 MG PO TABS
1.0000 mg | ORAL_TABLET | Freq: Once | ORAL | Status: AC
  Administered 2017-12-23: 1 mg via ORAL
  Filled 2017-12-23: qty 1

## 2017-12-23 MED ORDER — AMLODIPINE BESYLATE 10 MG PO TABS: 10.0000 mg | ORAL_TABLET | Freq: Every day | ORAL | 0 refills | Status: DC

## 2017-12-23 MED ORDER — MECLIZINE HCL 12.5 MG PO TABS: 12.5000 mg | ORAL_TABLET | Freq: Three times a day (TID) | ORAL | 0 refills | Status: DC | PRN

## 2017-12-23 MED ORDER — MECLIZINE HCL 12.5 MG PO TABS
25.0000 mg | ORAL_TABLET | Freq: Once | ORAL | Status: AC
  Administered 2017-12-23: 25 mg via ORAL
  Filled 2017-12-23: qty 2

## 2017-12-23 NOTE — ED Notes (Signed)
Pt transported to MRI for the 2nd attempt

## 2017-12-23 NOTE — ED Notes (Signed)
Patient transported to MRI 

## 2017-12-23 NOTE — ED Triage Notes (Signed)
Pt c/o of being weak and dizzy since this morning when she got out of bed.  Pt stopped taking her HTN meds a week ago.

## 2017-12-23 NOTE — ED Notes (Signed)
Pt returned from MRI, unable to obtain study

## 2017-12-23 NOTE — ED Provider Notes (Signed)
Emergency Department Provider Note   I have reviewed the triage vital signs and the nursing notes.   HISTORY  Chief Complaint Dizziness   HPI Kristin Carson is a 47 y.o. female with PMH of poorly controlled HTN, asthma, and GERD presents to the emergency department for evaluation of vertigo symptoms starting this morning.  Patient has been off of all of her blood pressure medicines for the past 5 days due to concern for allergic reaction.  She states that 2 days ago she developed numbness the left leg which she has never experienced before.  This numbness is persistent and mostly in the posterior leg.  She denies any lower back pain.  No numbness in the arms.  No weakness.  Denies any headache symptoms.  No earache or ringing in the ears.  Patient symptoms are worse with standing but also has some symptoms with rest.   Past Medical History:  Diagnosis Date  . Anemia   . Asthma   . Bronchitis   . Depression   . GERD (gastroesophageal reflux disease)   . Headache   . Hypertension     Patient Active Problem List   Diagnosis Date Noted  . Essential hypertension 12/10/2016  . Chronic obstructive pulmonary disease ([place]) 12/10/2016  . Cigarette nicotine dependence with nicotine-induced disorder 12/10/2016  . Chronic kidney disease 12/10/2016  . SOB (shortness of breath) 04/07/2013  . Migraine 04/07/2013    Past Surgical History:  Procedure Laterality Date  . BREAST SURGERY    . CESAREAN SECTION    . LEG SURGERY      Current Outpatient Rx  . Order #: [id number] Class: Normal  . Order #: [id number] Class: Normal  . Order #: [id number] Class: Print  . Order #: [id number] Class: Print  . Order #: [id number] Class: Print  . Order #: [id number] Class: Print  . Order #: [id number] Class: Print  . Order #: [id number] Class: Print    Allergies Lisinopril-hydrochlorothiazide; Ibuprofen; Tramadol; and Erythromycin  Family History  Problem Relation Age of Onset  . Dermatomyositis Mother     . Hypertension Mother   . Cancer Mother        lung  . Cancer Father        lung  . Heart disease Father     Social History Social History   Tobacco Use  . Smoking status: Current Every Day Smoker    Packs/day: 1.00    Years: 23.00    Pack years: 23.00    Types: Cigarettes  . Smokeless tobacco: Never Used  Substance Use Topics  . Alcohol use: Yes    Comment: 1 beer daily   . Drug use: Yes    Types: Marijuana    Comment: marijuana yesterday     Review of Systems  Constitutional: No fever/chills Eyes: No visual changes. ENT: No sore throat. Positive vertigo.  Cardiovascular: Denies chest pain. Respiratory: Denies shortness of breath. Gastrointestinal: No abdominal pain.  No nausea, no vomiting.  No diarrhea.  No constipation. Genitourinary: Negative for dysuria. Musculoskeletal: Negative for back pain. Skin: Negative for rash. Neurological: Negative for headaches, focal weakness. Positive posterior left leg numbness.   10-point ROS otherwise negative.  ____________________________________________   PHYSICAL EXAM:  VITAL SIGNS: ED Triage Vitals  Enc Vitals Group     BP 12/23/17 1450 (!) 169/105     Pulse Rate 12/23/17 1450 96     Resp 12/23/17 1450 18     Temp 12/23/17 1450 98.3 F (36.8 C)     Temp Source  12/23/17 1450 Oral     SpO2 12/23/17 1450 100 %     Weight 12/23/17 1451 170 lb (77.1 kg)     Height 12/23/17 1451 5\' 4"  (1.626 m)     Pain Score 12/23/17 1451 5   Constitutional: Alert and oriented. Well appearing and in no acute distress. Eyes: Conjunctivae are normal. Head: Atraumatic. Nose: No congestion/rhinnorhea. Mouth/Throat: Mucous membranes are moist.  Neck: No stridor. Cardiovascular: Normal rate, regular rhythm. Good peripheral circulation. Grossly normal heart sounds.   Respiratory: Normal respiratory effort.  No retractions. Lungs CTAB. Gastrointestinal: Soft and nontender. No distention.  Musculoskeletal: No lower extremity  tenderness nor edema. No gross deformities of extremities. Neurologic:  Normal speech and language. Normal sensation in the B/L LEs. No weakness. Normal gait. Normal CN exam 2-12.  Skin:  Skin is warm, dry and intact. No rash noted.   ____________________________________________   LABS (all labs ordered are listed, but only abnormal results are displayed)  Labs Reviewed  COMPREHENSIVE METABOLIC PANEL - Abnormal; Notable for the following components:      Result Value   Calcium 8.7 (*)    All other components within normal limits  LIPASE, BLOOD  CBC WITH DIFFERENTIAL/PLATELET  I-STAT TROPONIN, ED   ____________________________________________  EKG   EKG Interpretation  Date/Time:  Wednesday December 23 2017 14:58:28 EDT Ventricular Rate:  90 PR Interval:    QRS Duration: 76 QT Interval:  338 QTC Calculation: 414 R Axis:   5 Text Interpretation:  Sinus rhythm Low voltage, precordial leads Probable anteroseptal infarct, old No STEMI.  Confirmed by Nanda Quinton [phone]) on 12/23/2017 2:59:45 PM       ____________________________________________  RADIOLOGY  Ct Head Wo Contrast  Result Date: 12/23/2017 CLINICAL DATA:  Headache x2 days EXAM: CT HEAD WITHOUT CONTRAST TECHNIQUE: Contiguous axial images were obtained from the base of the skull through the vertex without intravenous contrast. COMPARISON:  None. FINDINGS: Brain: No evidence of acute infarction, hemorrhage, hydrocephalus, extra-axial collection or mass lesion/mass effect. Vascular: No hyperdense vessel or unexpected calcification. Skull: No acute calvarial fracture. Sinuses/Orbits: Remote small medial blowout fracture of the right orbit with herniated fat. Intact orbits and globes. No acute sinus disease. Other: Mild fullness of the included lacrimal glands bilaterally, likely within normal limits given symmetry. IMPRESSION: No acute intracranial abnormality. Electronically Signed   By: Ashley Royalty M.D.   On: 12/23/2017  18:29    ____________________________________________   PROCEDURES  Procedure(s) performed:   Procedures  None ____________________________________________   INITIAL IMPRESSION / ASSESSMENT AND PLAN / ED COURSE  Pertinent labs & imaging results that were available during my care of the patient were reviewed by me and considered in my medical decision making (see chart for details).  Patient presents to the emergency department with vertigo symptoms and left leg numbness.  Vertigo began today but numbness 2 days ago.  Patient has been off all hypertension medications for the last 5 days.  Her symptoms are not immediately reproducible.  He does describe some subjective numbness along the posterior of the left leg.  My suspicion for central vertigo is low however given her associated subjective numbness symptoms and medication noncompliance with hypertension medication plan for MRI given the patient is at high risk for CVA.  Patient unable to tolerate MRI. Attempted ativan administration but not successful. Labs reviewed with no acute findings. Symptoms improved with meclizine. Neuro exam is normal. CT head obtained with no evidence of subacute infarction. Plan for outpatient Neurology follow up  with local Neurology for further evaluation and referral to open MRI as deemed necessary. Patient to start taking Norvasc for BP. Referred to local PCP.   At this time, I do not feel there is any life-threatening condition present. I have reviewed and discussed all results (EKG, imaging, lab, urine as appropriate), exam findings with patient. I have reviewed nursing notes and appropriate previous records.  I feel the patient is safe to be discharged home without further emergent workup. Discussed usual and customary return precautions. Patient and family (if present) verbalize understanding and are comfortable with this plan.  Patient will follow-up with their primary care provider. If they do not have  a primary care provider, information for follow-up has been provided to them. All questions have been answered.    ____________________________________________  FINAL CLINICAL IMPRESSION(S) / ED DIAGNOSES  Final diagnoses:  Vertigo     MEDICATIONS GIVEN DURING THIS VISIT:  Medications  meclizine (ANTIVERT) tablet 25 mg (25 mg Oral Given 12/23/17 1532)  amLODipine (NORVASC) tablet 10 mg (10 mg Oral Given 12/23/17 1531)  LORazepam (ATIVAN) tablet 1 mg (1 mg Oral Given 12/23/17 1608)     NEW OUTPATIENT MEDICATIONS STARTED DURING THIS VISIT:  Discharge Medication List as of 12/23/2017  6:49 PM    START taking these medications   Details  meclizine (ANTIVERT) 12.5 MG tablet Take 1 tablet (12.5 mg total) by mouth 3 (three) times daily as needed for dizziness., Starting Wed 12/23/2017, Print        Note:  This document was prepared using Dragon voice recognition software and may include unintentional dictation errors.  Nanda Quinton, MD Emergency Medicine    Jasilyn Holderman, [facility], MD 12/24/17 [phone]

## 2017-12-23 NOTE — ED Notes (Signed)
Pt transported to MRI 

## 2017-12-23 NOTE — ED Notes (Signed)
Patient transported to CT 

## 2017-12-23 NOTE — ED Notes (Signed)
Pt brought back by MRI due to claustrophobia. EDP make aware and ordered ativan

## 2017-12-23 NOTE — Discharge Instructions (Signed)
We believe your symptoms were caused by benign vertigo.  Please read through the included information and take any prescribed medication(s).  Follow up with your doctor as listed above.  If you develop any new or worsening symptoms that concern you, including but not limited to persistent dizziness/vertigo, numbness or weakness in your arms or legs, altered mental status, persistent vomiting, or fever greater than 101, please return immediately to the Emergency Department.  

## 2017-12-31 ENCOUNTER — Encounter (HOSPITAL_COMMUNITY): Payer: Self-pay | Admitting: Emergency Medicine

## 2017-12-31 ENCOUNTER — Emergency Department (HOSPITAL_COMMUNITY)
Admission: EM | Admit: 2017-12-31 | Discharge: 2017-12-31 | Disposition: A | Discharge status: Home / Self Care | Payer: Medicaid Other | Attending: Emergency Medicine | Admitting: Emergency Medicine

## 2017-12-31 ENCOUNTER — Other Ambulatory Visit: Payer: Self-pay

## 2017-12-31 DIAGNOSIS — I129 Hypertensive chronic kidney disease with stage 1 through stage 4 chronic kidney disease, or unspecified chronic kidney disease: Secondary | ICD-10-CM | POA: Insufficient documentation

## 2017-12-31 DIAGNOSIS — N189 Chronic kidney disease, unspecified: Secondary | ICD-10-CM | POA: Insufficient documentation

## 2017-12-31 DIAGNOSIS — Z79899 Other long term (current) drug therapy: Secondary | ICD-10-CM | POA: Insufficient documentation

## 2017-12-31 DIAGNOSIS — J449 Chronic obstructive pulmonary disease, unspecified: Secondary | ICD-10-CM | POA: Insufficient documentation

## 2017-12-31 DIAGNOSIS — F17210 Nicotine dependence, cigarettes, uncomplicated: Secondary | ICD-10-CM | POA: Insufficient documentation

## 2017-12-31 DIAGNOSIS — L249 Irritant contact dermatitis, unspecified cause: Secondary | ICD-10-CM

## 2017-12-31 MED ORDER — DEXAMETHASONE SODIUM PHOSPHATE 4 MG/ML IJ SOLN
10.0000 mg | Freq: Once | INTRAMUSCULAR | Status: AC
Start: 2017-12-31 — End: 2017-12-31
  Administered 2017-12-31: 10 mg via INTRAMUSCULAR
  Filled 2017-12-31: qty 3

## 2017-12-31 MED ORDER — DIPHENHYDRAMINE HCL 25 MG PO CAPS
25.0000 mg | ORAL_CAPSULE | Freq: Once | ORAL | Status: AC
  Administered 2017-12-31: 25 mg via ORAL
  Filled 2017-12-31: qty 1

## 2017-12-31 MED ORDER — CLOTRIMAZOLE 1 % EX CREA: TOPICAL_CREAM | CUTANEOUS | 0 refills | Status: DC

## 2017-12-31 MED ORDER — PREDNISONE 10 MG PO TABS: ORAL_TABLET | ORAL | 0 refills | Status: DC

## 2017-12-31 NOTE — Discharge Instructions (Addendum)
Keep the areas of your neck as possible and apply the cream as directed.  Take benadryl one capsule every 4-6 hrs as needed for itching.  Start the prednisone prescription tomorrow.  Follow-up with your doctor or return here for any worsening symptoms

## 2017-12-31 NOTE — ED Triage Notes (Signed)
Pt c/o rash to neck x 1week. Started on arms a few days ago. C/o burning. Small red raised bumps noted to these areas. nad

## 2018-01-01 NOTE — ED Provider Notes (Signed)
[facility] EMERGENCY DEPARTMENT Provider Note   CSN: [id number] Arrival date & time: 12/31/17  1054     History   Chief Complaint Chief Complaint  Patient presents with  . Rash    HPI Kristin Carson is a 47 y.o. female.  HPI   Kristin Carson is a 47 y.o. female who presents to the Emergency Department complaining of itching and rash to her neck and arms.  Symptoms began 1 week ago.  Initially, she states the rash began around her neck and spread to her arms 3 days ago.  She describes the rash as " bumps" that are draining clear fluid and a burning sensation associated with the rash.  She has taken Benadryl, but not applied any over-the-counter anti-itch creams.  She denies exposure to metals, new medications, lotions soaps or detergents.  She also denies any swelling, difficulty swallowing or breathing, shortness of breath, or pain associated with the rash.  States other family members do not have similar symptoms.   Past Medical History:  Diagnosis Date  . Anemia   . Asthma   . Bronchitis   . Depression   . GERD (gastroesophageal reflux disease)   . Headache   . Hypertension     Patient Active Problem List   Diagnosis Date Noted  . Essential hypertension 12/10/2016  . Chronic obstructive pulmonary disease ([place]) 12/10/2016  . Cigarette nicotine dependence with nicotine-induced disorder 12/10/2016  . Chronic kidney disease 12/10/2016  . SOB (shortness of breath) 04/07/2013  . Migraine 04/07/2013    Past Surgical History:  Procedure Laterality Date  . BREAST SURGERY    . CESAREAN SECTION    . LEG SURGERY       OB History    Gravida  1   Para  1   Term  1   Preterm      AB      Living        SAB      TAB      Ectopic      Multiple      Live Births               Home Medications    Prior to Admission medications   Medication Sig Start Date End Date Taking? Authorizing Provider  albuterol (PROVENTIL HFA;VENTOLIN HFA) 108 (90 Base) MCG/ACT  inhaler Inhale 2 puffs into the lungs every 6 (six) hours as needed for wheezing or shortness of breath. 07/28/17   Soyla Dryer, PA-C  albuterol (PROVENTIL) (2.5 MG/3ML) 0.083% nebulizer solution Take 3 mLs (2.5 mg total) by nebulization every 6 (six) hours as needed for wheezing or shortness of breath. 07/28/17   Soyla Dryer, PA-C  amLODipine (NORVASC) 10 MG tablet Take 1 tablet (10 mg total) by mouth daily. 12/23/17 01/22/18  Long, [facility], MD  clotrimazole (LOTRIMIN) 1 % cream Apply to affected area 2 times daily 12/31/17   Jonathon Castelo, PA-C  famotidine (PEPCID) 20 MG tablet Take 1 tablet (20 mg total) by mouth 2 (two) times daily. 12/18/17   Rolland Porter, MD  hydrochlorothiazide (HYDRODIURIL) 25 MG tablet Take 1 tablet (25 mg total) by mouth daily. 12/11/17   Milton Ferguson, MD  meclizine (ANTIVERT) 12.5 MG tablet Take 1 tablet (12.5 mg total) by mouth 3 (three) times daily as needed for dizziness. 12/23/17   Long, [facility], MD  predniSONE (DELTASONE) 10 MG tablet Take 6 tablets day one, 5 tablets day two, 4 tablets day three, 3 tablets  day four, 2 tablets day five, then 1 tablet day six 12/31/17   Sarit Sparano, PA-C    Family History Family History  Problem Relation Age of Onset  . Dermatomyositis Mother   . Hypertension Mother   . Cancer Mother        lung  . Cancer Father        lung  . Heart disease Father     Social History Social History   Tobacco Use  . Smoking status: Current Every Day Smoker    Packs/day: 1.00    Years: 23.00    Pack years: 23.00    Types: Cigarettes  . Smokeless tobacco: Never Used  Substance Use Topics  . Alcohol use: Yes    Comment: 1 beer daily   . Drug use: Yes    Types: Marijuana     Allergies   Lisinopril-hydrochlorothiazide; Ibuprofen; Tramadol; and Erythromycin   Review of Systems Review of Systems  Constitutional: Negative for activity change, appetite change, chills and fever.  HENT: Negative for facial swelling, sore  throat and trouble swallowing.   Respiratory: Negative for chest tightness, shortness of breath and wheezing.   Gastrointestinal: Negative for nausea.  Musculoskeletal: Negative for myalgias, neck pain and neck stiffness.  Skin: Positive for rash. Negative for wound.  Neurological: Negative for dizziness, weakness, numbness and headaches.  All other systems reviewed and are negative.    Physical Exam Updated Vital Signs BP 134/80   Pulse 88   Temp 98.2 F (36.8 C) (Oral)   Resp 17   LMP 12/07/2017 (Approximate)   SpO2 100%   Physical Exam  Constitutional: She is oriented to person, place, and time. She appears well-developed and well-nourished. No distress.  HENT:  Head: Normocephalic and atraumatic.  Mouth/Throat: Uvula is midline, oropharynx is clear and moist and mucous membranes are normal. No oral lesions. No uvula swelling.  Neck: Normal range of motion. Neck supple. No JVD present. No thyromegaly present.  Cardiovascular: Normal rate, regular rhythm and intact distal pulses.  No murmur heard. Pulmonary/Chest: Effort normal and breath sounds normal. No respiratory distress.  Musculoskeletal: She exhibits no edema or tenderness.  Lymphadenopathy:    She has no cervical adenopathy.  Neurological: She is alert and oriented to person, place, and time. She exhibits normal muscle tone. Coordination normal.  Skin: Skin is warm. Capillary refill takes less than 2 seconds. Rash noted. No erythema.  Hyperkeratotic area at the base of the neck with scattered erythematous papules and slight serous fluid drainage at the base of the neck.  Flesh colored small papules of bilateral forearms.  Hands are spared.  Psychiatric: She has a normal mood and affect.  Nursing note and vitals reviewed.    ED Treatments / Results  Labs (all labs ordered are listed, but only abnormal results are displayed) Labs Reviewed - No data to display  EKG None  Radiology No results  found.  Procedures Procedures (including critical care time)  Medications Ordered in ED Medications  dexamethasone (DECADRON) injection 10 mg (10 mg Intramuscular Given 12/31/17 1143)  diphenhydrAMINE (BENADRYL) capsule 25 mg (25 mg Oral Given 12/31/17 1143)     Initial Impression / Assessment and Plan / ED Course  I have reviewed the triage vital signs and the nursing notes.  Pertinent labs & imaging results that were available during my care of the patient were reviewed by me and considered in my medical decision making (see chart for details).     Patient well-appearing.  Focal rash at the base of the neck and bilateral forearms, that appears consistent with a dermatitis, although symptoms may also be associated with a fungus.  Doubt infectious process . airway patent.  No edema.  Patient agrees to treatment plan with steroids antifungal and over-the-counter Benadryl  Final Clinical Impressions(s) / ED Diagnoses   Final diagnoses:  Irritant contact dermatitis, unspecified trigger    ED Discharge Orders        Ordered    predniSONE (DELTASONE) 10 MG tablet     12/31/17 1139    clotrimazole (LOTRIMIN) 1 % cream     12/31/17 1139       Naeemah Jasmer, [place], PA-C 01/01/18 1546    Isla Pence, MD 01/04/18 1505

## 2018-01-12 ENCOUNTER — Emergency Department (HOSPITAL_COMMUNITY)
Admission: EM | Admit: 2018-01-12 | Discharge: 2018-01-12 | Disposition: A | Discharge status: Home / Self Care | Payer: Self-pay | Attending: Emergency Medicine | Admitting: Emergency Medicine

## 2018-01-12 ENCOUNTER — Emergency Department (HOSPITAL_COMMUNITY): Payer: Self-pay

## 2018-01-12 ENCOUNTER — Encounter (HOSPITAL_COMMUNITY): Payer: Self-pay | Admitting: Adult Health

## 2018-01-12 ENCOUNTER — Other Ambulatory Visit: Payer: Self-pay

## 2018-01-12 DIAGNOSIS — J45909 Unspecified asthma, uncomplicated: Secondary | ICD-10-CM | POA: Insufficient documentation

## 2018-01-12 DIAGNOSIS — I129 Hypertensive chronic kidney disease with stage 1 through stage 4 chronic kidney disease, or unspecified chronic kidney disease: Secondary | ICD-10-CM | POA: Insufficient documentation

## 2018-01-12 DIAGNOSIS — Z79899 Other long term (current) drug therapy: Secondary | ICD-10-CM | POA: Insufficient documentation

## 2018-01-12 DIAGNOSIS — R918 Other nonspecific abnormal finding of lung field: Secondary | ICD-10-CM | POA: Insufficient documentation

## 2018-01-12 DIAGNOSIS — Z3202 Encounter for pregnancy test, result negative: Secondary | ICD-10-CM | POA: Insufficient documentation

## 2018-01-12 DIAGNOSIS — N189 Chronic kidney disease, unspecified: Secondary | ICD-10-CM | POA: Insufficient documentation

## 2018-01-12 DIAGNOSIS — F17210 Nicotine dependence, cigarettes, uncomplicated: Secondary | ICD-10-CM | POA: Insufficient documentation

## 2018-01-12 DIAGNOSIS — R0789 Other chest pain: Secondary | ICD-10-CM | POA: Insufficient documentation

## 2018-01-12 LAB — TROPONIN I

## 2018-01-12 LAB — CBC
HCT: 39.3 % (ref 36.0–46.0)
Hemoglobin: 12.8 g/dL (ref 12.0–15.0)
MCH: 28.7 pg (ref 26.0–34.0)
MCHC: 32.6 g/dL (ref 30.0–36.0)
MCV: 88.1 fL (ref 78.0–100.0)
PLATELETS: 331 10*3/uL (ref 150–400)
RBC: 4.46 MIL/uL (ref 3.87–5.11)
RDW: 13.9 % (ref 11.5–15.5)
WBC: 6.6 10*3/uL (ref 4.0–10.5)

## 2018-01-12 LAB — BASIC METABOLIC PANEL
Anion gap: 9 (ref 5–15)
BUN: 13 mg/dL (ref 6–20)
CALCIUM: 9.1 mg/dL (ref 8.9–10.3)
CO2: 25 mmol/L (ref 22–32)
CREATININE: 0.84 mg/dL (ref 0.44–1.00)
Chloride: 103 mmol/L (ref 101–111)
GFR calc Af Amer: >60 mL/min (ref >60)
GLUCOSE: 89 mg/dL (ref 65–99)
Potassium: 3.9 mmol/L (ref 3.5–5.1)
SODIUM: 137 mmol/L (ref 135–145)

## 2018-01-12 LAB — PREGNANCY, URINE: Preg Test, Ur: NEGATIVE

## 2018-01-12 LAB — D-DIMER, QUANTITATIVE: D-Dimer, Quant: 2.63 ug/mL-FEU — ABNORMAL HIGH (ref 0.00–0.50)

## 2018-01-12 MED ORDER — LORAZEPAM 2 MG/ML IJ SOLN
1.0000 mg | Freq: Once | INTRAMUSCULAR | Status: AC
  Administered 2018-01-12: 1 mg via INTRAVENOUS
  Filled 2018-01-12: qty 1

## 2018-01-12 MED ORDER — IOPAMIDOL (ISOVUE-370) INJECTION 76%
100.0000 mL | Freq: Once | INTRAVENOUS | Status: AC | PRN
  Administered 2018-01-12: 100 mL via INTRAVENOUS

## 2018-01-12 NOTE — ED Provider Notes (Signed)
[facility] EMERGENCY DEPARTMENT Provider Note   CSN: [id number] Arrival date & time: 01/12/18  1125     History   Chief Complaint Chief Complaint  Patient presents with  . Chest Pain    HPI Kristin Carson is a 47 y.o. female.  HPI Patient presented to the emergency room for evaluation of chest pain.  Patient states when she woke up this morning she had sharp pain in the center of her chest.  Pain did not radiate.  She did not become diaphoretic or nauseated.  She felt a little short of breath when she had the pain.  Sharp pain resolved but she is had some aching throughout the day.  Pain comes and goes and gets worse when she takes a deep breath or moves around.  Patient denies any history of heart or lung disease.  No history of PE or DVT. Past Medical History:  Diagnosis Date  . Anemia   . Asthma   . Bronchitis   . Depression   . GERD (gastroesophageal reflux disease)   . Headache   . Hypertension     Patient Active Problem List   Diagnosis Date Noted  . Essential hypertension 12/10/2016  . Chronic obstructive pulmonary disease ([place]) 12/10/2016  . Cigarette nicotine dependence with nicotine-induced disorder 12/10/2016  . Chronic kidney disease 12/10/2016  . SOB (shortness of breath) 04/07/2013  . Migraine 04/07/2013    Past Surgical History:  Procedure Laterality Date  . BREAST SURGERY    . CESAREAN SECTION    . LEG SURGERY       OB History    Gravida  1   Para  1   Term  1   Preterm      AB      Living        SAB      TAB      Ectopic      Multiple      Live Births               Home Medications    Prior to Admission medications   Medication Sig Start Date End Date Taking? Authorizing Provider  albuterol (PROVENTIL HFA;VENTOLIN HFA) 108 (90 Base) MCG/ACT inhaler Inhale 2 puffs into the lungs every 6 (six) hours as needed for wheezing or shortness of breath. Patient taking differently: Inhale 1-2 puffs into the lungs every 6 (six)  hours as needed for wheezing or shortness of breath.  07/28/17  Yes Soyla Dryer, PA-C  albuterol (PROVENTIL) (2.5 MG/3ML) 0.083% nebulizer solution Take 3 mLs (2.5 mg total) by nebulization every 6 (six) hours as needed for wheezing or shortness of breath. 07/28/17  Yes Soyla Dryer, PA-C  amLODipine (NORVASC) 10 MG tablet Take 1 tablet (10 mg total) by mouth daily. 12/23/17 01/22/18 Yes Long, Wonda Olds, MD  clotrimazole (LOTRIMIN) 1 % cream Apply to affected area 2 times daily 12/31/17  Yes Triplett, Tammy, PA-C  diphenhydrAMINE (BENADRYL) 25 mg capsule Take 25 mg by mouth daily as needed.   Yes [provider]  famotidine (PEPCID) 20 MG tablet Take 1 tablet (20 mg total) by mouth 2 (two) times daily. Patient not taking: Reported on 01/12/2018 12/18/17   Rolland Porter, MD  hydrochlorothiazide (HYDRODIURIL) 25 MG tablet Take 1 tablet (25 mg total) by mouth daily. 12/11/17   Milton Ferguson, MD  meclizine (ANTIVERT) 12.5 MG tablet Take 1 tablet (12.5 mg total) by mouth 3 (three) times daily as needed for dizziness. 12/23/17  Long, Wonda Olds, MD  predniSONE (DELTASONE) 10 MG tablet Take 6 tablets day one, 5 tablets day two, 4 tablets day three, 3 tablets day four, 2 tablets day five, then 1 tablet day six 12/31/17   Kem Parkinson, PA-C    Family History Family History  Problem Relation Age of Onset  . Dermatomyositis Mother   . Hypertension Mother   . Cancer Mother        lung  . Cancer Father        lung  . Heart disease Father     Social History Social History   Tobacco Use  . Smoking status: Current Every Day Smoker    Packs/day: 1.00    Years: 23.00    Pack years: 23.00    Types: Cigarettes  . Smokeless tobacco: Never Used  Substance Use Topics  . Alcohol use: Yes    Comment: 1 beer daily   . Drug use: Yes    Types: Marijuana     Allergies   Lisinopril-hydrochlorothiazide; Ibuprofen; Tramadol; and Erythromycin   Review of Systems Review of Systems  Skin:  Positive for rash ( Chronic eczema).  All other systems reviewed and are negative.    Physical Exam Updated Vital Signs BP (!) 97/40   Pulse 85   Temp 98.3 F (36.8 C) (Oral)   Resp 20   Ht 1.626 m (5\' 4" )   Wt 77.1 kg (170 lb)   LMP 12/28/2017 (Approximate)   SpO2 100%   BMI 29.18 kg/m   Physical Exam  Constitutional: She appears well-developed and well-nourished. No distress.  HENT:  Head: Normocephalic and atraumatic.  Right Ear: External ear normal.  Left Ear: External ear normal.  Eyes: Conjunctivae are normal. Right eye exhibits no discharge. Left eye exhibits no discharge. No scleral icterus.  Neck: Neck supple. No tracheal deviation present.  Cardiovascular: Normal rate, regular rhythm and intact distal pulses.  Pulmonary/Chest: Effort normal and breath sounds normal. No stridor. No respiratory distress. She has no wheezes. She has no rales.  Tenderness palpation anterior chest wall  Abdominal: Soft. Bowel sounds are normal. She exhibits no distension. There is no tenderness. There is no rebound and no guarding.  Musculoskeletal: She exhibits no edema or tenderness.  Neurological: She is alert. She has normal strength. No cranial nerve deficit (no facial droop, extraocular movements intact, no slurred speech) or sensory deficit. She exhibits normal muscle tone. She displays no seizure activity. Coordination normal.  Skin: Skin is warm and dry. No rash noted.  Psychiatric: She has a normal mood and affect.  Nursing note and vitals reviewed.    ED Treatments / Results  Labs (all labs ordered are listed, but only abnormal results are displayed) Labs Reviewed  D-DIMER, QUANTITATIVE (NOT AT [facility]) - Abnormal; Notable for the following components:      Result Value   D-Dimer, Quant 2.63 (*)    All other components within normal limits  BASIC METABOLIC PANEL  CBC  PREGNANCY, URINE  TROPONIN I  TROPONIN I    EKG EKG Interpretation  Date/Time:  Tuesday Jan 12 2018 11:34:06 EDT Ventricular Rate:  107 PR Interval:  150 QRS Duration: 70 QT Interval:  328 QTC Calculation: 437 R Axis:   67 Text Interpretation:  Sinus tachycardia Anterior infarct , age undetermined Abnormal ECG Since last tracing rate faster Confirmed by Dorie Rank [phone]) on 01/12/2018 2:41:20 PM   Radiology Dg Chest 2 View  Result Date: 01/12/2018 CLINICAL DATA:  Chest pain;  current smoker. EXAM: CHEST - 2 VIEW COMPARISON:  PA and lateral chest x-ray of July 12, 2017 FINDINGS: The lungs are well-expanded. The interstitial markings are coarse but stable. The heart and pulmonary vascularity are normal. The mediastinum is normal in width. The bony thorax exhibits no acute abnormality. IMPRESSION: There is no pneumonia nor other acute cardiopulmonary abnormality. Electronically Signed   By: David  Martinique M.D.   On: 01/12/2018 12:31   Ct Angio Chest Pe W And/or Wo Contrast  Result Date: 01/12/2018 CLINICAL DATA:  Chest pain left-sided EXAM: CT ANGIOGRAPHY CHEST WITH CONTRAST TECHNIQUE: Multidetector CT imaging of the chest was performed using the standard protocol during bolus administration of intravenous contrast. Multiplanar CT image reconstructions and MIPs were obtained to evaluate the vascular anatomy. CONTRAST:  153mL ISOVUE-370 IOPAMIDOL (ISOVUE-370) INJECTION 76% COMPARISON:  Chest x-ray 01/12/2018, CT chest 06/15/2017 FINDINGS: Cardiovascular: Satisfactory opacification of the pulmonary arteries to the segmental level. No evidence of pulmonary embolism. Normal heart size. No pericardial effusion. Nonaneurysmal aorta. No dissection. Mediastinum/Nodes: No enlarged mediastinal, hilar, or axillary lymph nodes. Thyroid gland, trachea, and esophagus demonstrate no significant findings. Lungs/Pleura: No consolidation or pleural effusion. Innumerable bilateral pulmonary nodules measuring up to 5 mm. Nodules appears slightly more conspicuous in the right upper and lower lobes compared to  previous. Upper Abdomen: Hepatic steatosis.  No acute abnormality Musculoskeletal: No chest wall abnormality. No acute or significant osseous findings. Review of the MIP images confirms the above findings. IMPRESSION: 1. Negative for acute pulmonary embolus or aortic dissection. 2. No acute pulmonary infiltrate 3. Innumerable bilateral pulmonary nodules. Slight increased conspicuity of nodules in the right upper and lower lobes. Non-contrast chest CT at 3-6 months is recommended. If the nodules are stable at time of repeat CT, then future CT at 18-24 months (from today's scan) is considered optional for low-risk patients, but is recommended for high-risk patients. This recommendation follows the consensus statement: Guidelines for Management of Incidental Pulmonary Nodules Detected on CT Images: From the Fleischner Society 2017; Radiology 2017; 284:228-243. 4. Hepatic steatosis Electronically Signed   By: Donavan Foil M.D.   On: 01/12/2018 19:27    Procedures Procedures (including critical care time)  Medications Ordered in ED Medications  iopamidol (ISOVUE-370) 76 % injection 100 mL (100 mLs Intravenous Contrast Given 01/12/18 1900)  LORazepam (ATIVAN) injection 1 mg (1 mg Intravenous Given 01/12/18 1832)     Initial Impression / Assessment and Plan / ED Course  I have reviewed the triage vital signs and the nursing notes.  Pertinent labs & imaging results that were available during my care of the patient were reviewed by me and considered in my medical decision making (see chart for details).   Patient presented to the emergency room for chest pain.  Patient symptoms were atypical for cardiac disease.  Serial troponins were negative.  I doubt symptoms are related to acute coronary syndrome.  Patient symptoms did increase with deep breathing.  Patient was PERC positive so a d-dimer was ordered.  D-dimer was positive therefore a CT scan was performed.  CT scan does not show any evidence of  pulmonary embolism.  Incidental pulmonary nodules were noted.  Outpatient follow-up CT scan was discussed with the patient.  Suspect her symptoms may be related to chest wall discomfort.  Patient can take over-the-counter medications as needed for pain.  Follow-up with a primary care doctor.  Final Clinical Impressions(s) / ED Diagnoses   Final diagnoses:  Chest wall pain  Pulmonary nodules  ED Discharge Orders    None       Dorie Rank, MD 01/12/18 [phone]

## 2018-01-12 NOTE — Discharge Instructions (Addendum)
Take over-the-counter medications as needed for the chest wall pain, follow-up with a primary care doctor.  Incidental lung nodules were noted on the CT scan.  The radiologist recommends a repeat CT scan in 3 to 6 months to make sure they are not changing in size

## 2018-01-12 NOTE — ED Notes (Signed)
Gave EKG to Dr. Zammit 

## 2018-01-12 NOTE — ED Notes (Signed)
Unable to do CT Scan.  Pt states she freaked out .

## 2018-01-12 NOTE — ED Triage Notes (Signed)
Presents with left sided chest pain that comes and goes and is worse when holding breath and movement. Denies radiation. Endorses SOB. Nothing makes pain better. Pain began AM and woke patinet from sleep.

## 2018-03-04 ENCOUNTER — Other Ambulatory Visit: Payer: Self-pay

## 2018-03-04 ENCOUNTER — Emergency Department (HOSPITAL_COMMUNITY)
Admission: EM | Admit: 2018-03-04 | Discharge: 2018-03-04 | Disposition: A | Discharge status: Home / Self Care | Payer: Medicaid Other | Attending: Emergency Medicine | Admitting: Emergency Medicine

## 2018-03-04 ENCOUNTER — Encounter (HOSPITAL_COMMUNITY): Payer: Self-pay

## 2018-03-04 DIAGNOSIS — F17210 Nicotine dependence, cigarettes, uncomplicated: Secondary | ICD-10-CM | POA: Insufficient documentation

## 2018-03-04 DIAGNOSIS — J45909 Unspecified asthma, uncomplicated: Secondary | ICD-10-CM | POA: Insufficient documentation

## 2018-03-04 DIAGNOSIS — L509 Urticaria, unspecified: Secondary | ICD-10-CM | POA: Insufficient documentation

## 2018-03-04 DIAGNOSIS — I129 Hypertensive chronic kidney disease with stage 1 through stage 4 chronic kidney disease, or unspecified chronic kidney disease: Secondary | ICD-10-CM | POA: Insufficient documentation

## 2018-03-04 DIAGNOSIS — N189 Chronic kidney disease, unspecified: Secondary | ICD-10-CM | POA: Insufficient documentation

## 2018-03-04 DIAGNOSIS — Z79899 Other long term (current) drug therapy: Secondary | ICD-10-CM | POA: Insufficient documentation

## 2018-03-04 DIAGNOSIS — F329 Major depressive disorder, single episode, unspecified: Secondary | ICD-10-CM | POA: Insufficient documentation

## 2018-03-04 MED ORDER — DEXAMETHASONE SODIUM PHOSPHATE 10 MG/ML IJ SOLN
10.0000 mg | Freq: Once | INTRAMUSCULAR | Status: AC
  Administered 2018-03-04: 10 mg via INTRAMUSCULAR
  Filled 2018-03-04: qty 1

## 2018-03-04 MED ORDER — PREDNISONE 20 MG PO TABS: 20.0000 mg | ORAL_TABLET | Freq: Every day | ORAL | 0 refills | Status: AC

## 2018-03-04 MED ORDER — HYDROXYZINE HCL 25 MG PO TABS: 25.0000 mg | ORAL_TABLET | Freq: Four times a day (QID) | ORAL | 0 refills | Status: DC

## 2018-03-04 NOTE — ED Triage Notes (Signed)
Pt is having an allergic reaction. Stated this has been going on for 2 weeks, but is getting progressively worse. Pt has been taking Benadryl for this and it has helped, but today it has not. Rash to bilateral legs and arms and well as facial swelling to eyes and top lip. Pt stated she does not take Lisinopril anymore, but was switched to Amlodipine.

## 2018-03-04 NOTE — ED Provider Notes (Signed)
[facility] EMERGENCY DEPARTMENT Provider Note   CSN: [id number] Arrival date & time: 03/04/18  1048     History   Chief Complaint Chief Complaint  Patient presents with  . Allergic Reaction    HPI Kristin Carson is a 47 y.o. female.  HPI  47 year old female, she has a history of high blood pressure, had previously been taking an ACE inhibitor but because of side effects was stopped on this medication and started on amlodipine which she has taken successfully now for couple of months.  She reports that over the last couple of months she has had some intermittent ongoing issues with urticaria as well as with some intermittent swelling of her upper lip.  She presents again today stating it is gotten worse overnight.  She has had multiple visits, this seems to date back to approximately December 11, 2017.  She has been taking Benadryl with some intermittent relief but the rash became worse and this morning.  She denies any swelling of her throat, denies shortness of breath  Past Medical History:  Diagnosis Date  . Anemia   . Asthma   . Bronchitis   . Depression   . GERD (gastroesophageal reflux disease)   . Headache   . Hypertension     Patient Active Problem List   Diagnosis Date Noted  . Essential hypertension 12/10/2016  . Chronic obstructive pulmonary disease ([place]) 12/10/2016  . Cigarette nicotine dependence with nicotine-induced disorder 12/10/2016  . Chronic kidney disease 12/10/2016  . SOB (shortness of breath) 04/07/2013  . Migraine 04/07/2013    Past Surgical History:  Procedure Laterality Date  . BREAST SURGERY    . CESAREAN SECTION    . LEG SURGERY       OB History    Gravida  1   Para  1   Term  1   Preterm      AB      Living        SAB      TAB      Ectopic      Multiple      Live Births               Home Medications    Prior to Admission medications   Medication Sig Start Date End Date Taking? Authorizing Provider  albuterol  (PROVENTIL HFA;VENTOLIN HFA) 108 (90 Base) MCG/ACT inhaler Inhale 2 puffs into the lungs every 6 (six) hours as needed for wheezing or shortness of breath. Patient taking differently: Inhale 1-2 puffs into the lungs every 6 (six) hours as needed for wheezing or shortness of breath.  07/28/17   Soyla Dryer, PA-C  albuterol (PROVENTIL) (2.5 MG/3ML) 0.083% nebulizer solution Take 3 mLs (2.5 mg total) by nebulization every 6 (six) hours as needed for wheezing or shortness of breath. 07/28/17   Soyla Dryer, PA-C  amLODipine (NORVASC) 10 MG tablet Take 1 tablet (10 mg total) by mouth daily. 12/23/17 01/22/18  LongWonda Olds, MD  clotrimazole (LOTRIMIN) 1 % cream Apply to affected area 2 times daily 12/31/17   Triplett, Tammy, PA-C  diphenhydrAMINE (BENADRYL) 25 mg capsule Take 25 mg by mouth daily as needed.    [provider]  famotidine (PEPCID) 20 MG tablet Take 1 tablet (20 mg total) by mouth 2 (two) times daily. Patient not taking: Reported on 01/12/2018 12/18/17   Rolland Porter, MD  hydrochlorothiazide (HYDRODIURIL) 25 MG tablet Take 1 tablet (25 mg total) by mouth daily. 12/11/17  Milton Ferguson, MD  hydrOXYzine (ATARAX/VISTARIL) 25 MG tablet Take 1 tablet (25 mg total) by mouth every 6 (six) hours. 03/04/18   Noemi Chapel, MD  meclizine (ANTIVERT) 12.5 MG tablet Take 1 tablet (12.5 mg total) by mouth 3 (three) times daily as needed for dizziness. 12/23/17   Long, Wonda Olds, MD  predniSONE (DELTASONE) 20 MG tablet Take 1 tablet (20 mg total) by mouth daily for 10 days. 03/04/18 03/14/18  Noemi Chapel, MD    Family History Family History  Problem Relation Age of Onset  . Dermatomyositis Mother   . Hypertension Mother   . Cancer Mother        lung  . Cancer Father        lung  . Heart disease Father     Social History Social History   Tobacco Use  . Smoking status: Current Every Day Smoker    Packs/day: 1.00    Years: 23.00    Pack years: 23.00    Types: Cigarettes  . Smokeless  tobacco: Never Used  Substance Use Topics  . Alcohol use: Yes    Comment: 1 beer daily   . Drug use: Yes    Types: Marijuana     Allergies   Lisinopril-hydrochlorothiazide; Ibuprofen; Tramadol; and Erythromycin   Review of Systems Review of Systems  All other systems reviewed and are negative.    Physical Exam Updated Vital Signs BP (!) 153/86 (BP Location: Right Arm)   Pulse (!) 113   Temp 98.9 F (37.2 C) (Oral)   Resp 18   Ht 5\' 4"  (1.626 m)   Wt 77.1 kg (170 lb)   LMP 02/05/2018   SpO2 100%   BMI 29.18 kg/m   Physical Exam  Constitutional: She appears well-developed and well-nourished. No distress.  HENT:  Head: Normocephalic and atraumatic.  Mouth/Throat: Oropharynx is clear and moist. No oropharyngeal exudate.  Oropharynx is clear and moist, there is some angioedema to the upper lip  Eyes: Pupils are equal, round, and reactive to light. Conjunctivae and EOM are normal. Right eye exhibits no discharge. Left eye exhibits no discharge. No scleral icterus.  Neck: Normal range of motion. Neck supple. No JVD present. No thyromegaly present.  Cardiovascular: Normal rate, regular rhythm, normal heart sounds and intact distal pulses. Exam reveals no gallop and no friction rub.  No murmur heard. Pulmonary/Chest: Effort normal and breath sounds normal. No respiratory distress. She has no wheezes. She has no rales.  Lung sounds are normal  Abdominal: Soft. Bowel sounds are normal. She exhibits no distension and no mass. There is no tenderness.  Musculoskeletal: Normal range of motion. She exhibits no edema or tenderness.  Lymphadenopathy:    She has no cervical adenopathy.  Neurological: She is alert. Coordination normal.  Skin: Skin is warm and dry. Rash noted. There is erythema.  Urticarial rash scattered across the bilateral lower extremities, occasional spots on the upper extremities, no truncal lesions  Psychiatric: She has a normal mood and affect. Her behavior is  normal.  Nursing note and vitals reviewed.    ED Treatments / Results  Labs (all labs ordered are listed, but only abnormal results are displayed) Labs Reviewed - No data to display  EKG None  Radiology No results found.  Procedures Procedures (including critical care time)  Medications Ordered in ED Medications  dexamethasone (DECADRON) injection 10 mg (has no administration in time range)     Initial Impression / Assessment and Plan / ED Course  I  have reviewed the triage vital signs and the nursing notes.  Pertinent labs & imaging results that were available during my care of the patient were reviewed by me and considered in my medical decision making (see chart for details).    Ongoing allergic reaction, unclear what is causing this, may benefit from stopping amlodipine and starting other medications.  At this point it is unclear exactly what the patient is allergic to but it did predate the amlodipine.  We will give shot of Decadron, home on prednisone low-dose with ongoing antihistamine use, patient agreeable, I have informed her that she needs to follow-up in the outpatient setting likely with an allergist.  Final Clinical Impressions(s) / ED Diagnoses   Final diagnoses:  Urticaria    ED Discharge Orders        Ordered    predniSONE (DELTASONE) 20 MG tablet  Daily     03/04/18 1112    hydrOXYzine (ATARAX/VISTARIL) 25 MG tablet  Every 6 hours     03/04/18 1112       Noemi Chapel, MD 03/04/18 1113

## 2018-03-04 NOTE — Discharge Instructions (Addendum)
Please follow-up with a family doctor, if you do not have a family doctor see the list below or the list above  You MUST follow up with an allergist  [facility] Doctor List    Sinda Du MD. Specialty: Pulmonary Disease Contact information: [place] [address]  [phone]   Tula Nakayama, MD. Specialty: [facility] Medicine Contact information: [address] [place] [address]  [phone]   Sallee Lange, MD. Specialty: Family Medicine Contact information: [place] [address]  [phone]   Rosita Fire, MD Specialty: Internal Medicine Contact information: [place] [address]  [phone]   Delphina Cahill, MD. Specialty: Internal Medicine Contact information: [place] [address]  [phone]    [facility] (Dr. Maudie Mercury) Specialty: Family Medicine Contact information: [place] [address]  [phone]   Leslie Andrea, MD. Specialty: [facility] Medicine Contact information: [place] [address]  [phone]   Asencion Noble, MD. Specialty: Internal Medicine Contact information: [address]  [place] [address]  [place]  [address] [place], [place] [address] [phone]  Services [facility] offers a variety of basic health services.  Services include but are not limited to: Blood pressure checks  Heart rate checks  Blood sugar checks  Urine analysis  Rapid strep tests  Pregnancy tests.  Health education and referrals  People needing more complex services will be directed to a physician online. Using these virtual visits, doctors can evaluate and prescribe medicine and treatments. There will be no medication on-site, though [facility] will help  patients fill their prescriptions at little to no cost.   For More information please go to: [email or web address]

## 2018-05-11 IMAGING — DX DG CHEST 2V
2 series · 2 of 2 positions shown · non-contrast
Comparison: 12/10/2015

CLINICAL DATA: Chest pain starting last night.

EXAM:
CHEST  2 VIEW

[chest pa]
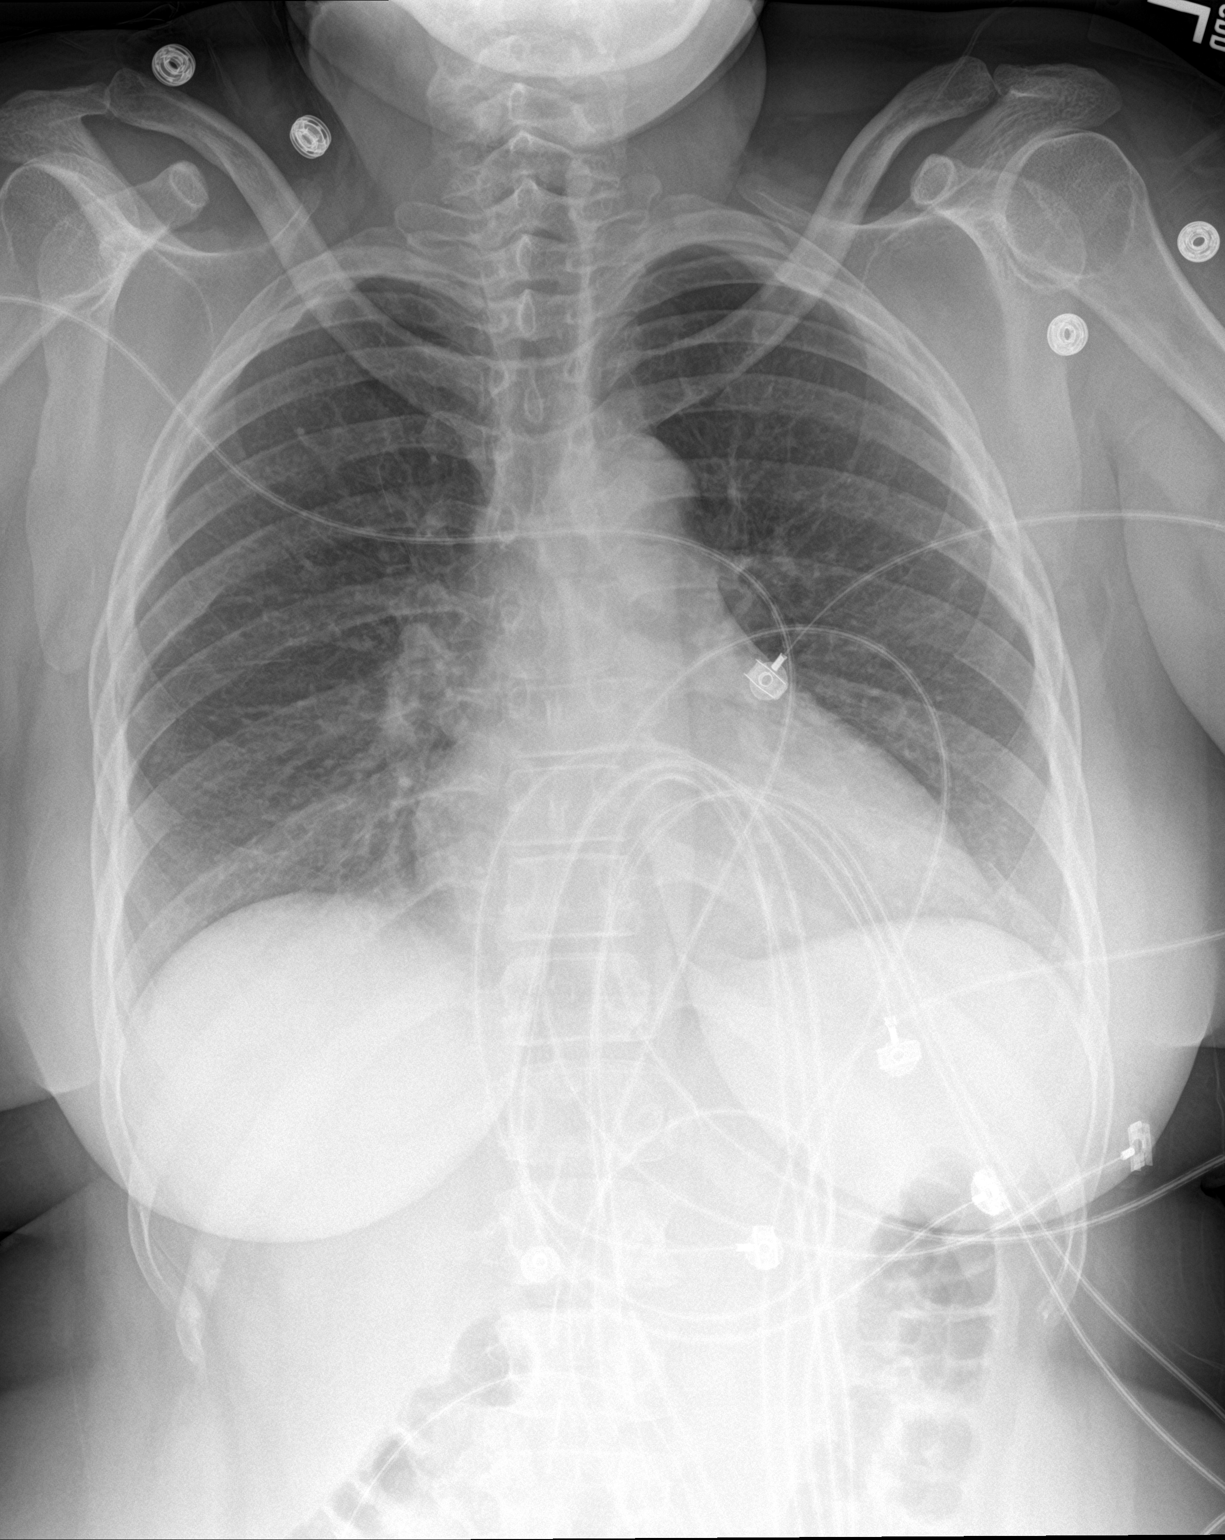

[chest lat]
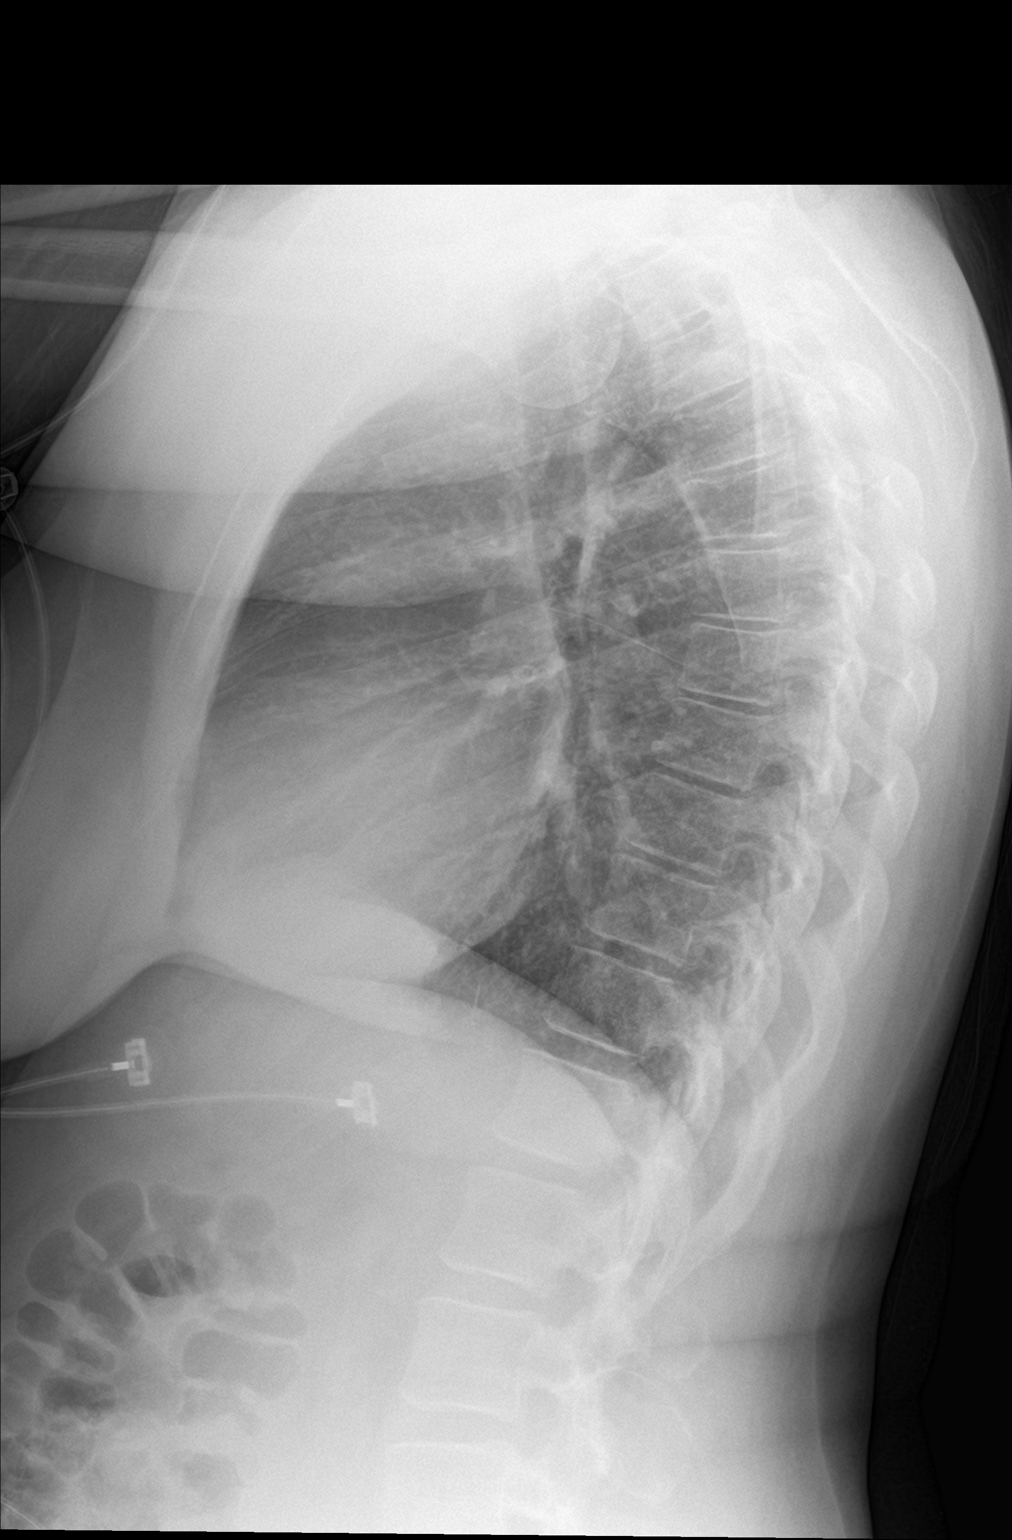

[2 of 2 positions shown; findings below may reference images not displayed]

FINDINGS: The heart size and mediastinal contours are within normal limits.
Both lungs are clear. The visualized skeletal structures are
unremarkable.
IMPRESSION: No active cardiopulmonary disease.

## 2018-05-26 ENCOUNTER — Emergency Department (HOSPITAL_COMMUNITY): Payer: Self-pay

## 2018-05-26 ENCOUNTER — Emergency Department (HOSPITAL_COMMUNITY)
Admission: EM | Admit: 2018-05-26 | Discharge: 2018-05-26 | Disposition: A | Discharge status: Home / Self Care | Payer: Self-pay | Attending: Emergency Medicine | Admitting: Emergency Medicine

## 2018-05-26 ENCOUNTER — Encounter (HOSPITAL_COMMUNITY): Payer: Self-pay

## 2018-05-26 DIAGNOSIS — Z79899 Other long term (current) drug therapy: Secondary | ICD-10-CM | POA: Insufficient documentation

## 2018-05-26 DIAGNOSIS — F17210 Nicotine dependence, cigarettes, uncomplicated: Secondary | ICD-10-CM | POA: Insufficient documentation

## 2018-05-26 DIAGNOSIS — I10 Essential (primary) hypertension: Secondary | ICD-10-CM | POA: Insufficient documentation

## 2018-05-26 DIAGNOSIS — J45909 Unspecified asthma, uncomplicated: Secondary | ICD-10-CM | POA: Insufficient documentation

## 2018-05-26 DIAGNOSIS — J209 Acute bronchitis, unspecified: Secondary | ICD-10-CM | POA: Insufficient documentation

## 2018-05-26 DIAGNOSIS — J069 Acute upper respiratory infection, unspecified: Secondary | ICD-10-CM | POA: Insufficient documentation

## 2018-05-26 MED ORDER — PREDNISONE 20 MG PO TABS
40.0000 mg | ORAL_TABLET | Freq: Once | ORAL | Status: AC
  Administered 2018-05-26: 40 mg via ORAL
  Filled 2018-05-26: qty 2

## 2018-05-26 MED ORDER — OXYMETAZOLINE HCL 0.05 % NA SOLN
1.0000 | Freq: Once | NASAL | Status: AC
  Administered 2018-05-26: 1 via NASAL
  Filled 2018-05-26: qty 15

## 2018-05-26 MED ORDER — CEPHALEXIN 500 MG PO CAPS: 500.0000 mg | ORAL_CAPSULE | Freq: Four times a day (QID) | ORAL | 0 refills | Status: DC

## 2018-05-26 MED ORDER — PREDNISONE 20 MG PO TABS: 40.0000 mg | ORAL_TABLET | Freq: Every day | ORAL | 0 refills | Status: DC

## 2018-05-26 MED ORDER — AZITHROMYCIN 250 MG PO TABS
500.0000 mg | ORAL_TABLET | Freq: Once | ORAL | Status: AC
  Administered 2018-05-26: 500 mg via ORAL
  Filled 2018-05-26: qty 2

## 2018-05-26 MED ORDER — AMLODIPINE BESYLATE 5 MG PO TABS
10.0000 mg | ORAL_TABLET | Freq: Once | ORAL | Status: AC
  Administered 2018-05-26: 10 mg via ORAL
  Filled 2018-05-26: qty 2

## 2018-05-26 MED ORDER — HYDROCHLOROTHIAZIDE 25 MG PO TABS: 25.0000 mg | ORAL_TABLET | Freq: Every day | ORAL | 1 refills | Status: DC

## 2018-05-26 MED ORDER — DIPHENHYDRAMINE HCL 12.5 MG/5ML PO ELIX
12.5000 mg | ORAL_SOLUTION | Freq: Once | ORAL | Status: AC
  Administered 2018-05-26: 12.5 mg via ORAL
  Filled 2018-05-26: qty 5

## 2018-05-26 NOTE — ED Provider Notes (Signed)
[facility] EMERGENCY DEPARTMENT Provider Note   CSN: [id number] Arrival date & time: 05/26/18  1639     History   Chief Complaint Chief Complaint  Patient presents with  . Cough    HPI Kristin Carson is a 47 y.o. female.  Patient is a 47 year old female who presents to the emergency department with a complaint of cough, sore throat, and headache.  Throat  Patient states that on yesterday September 24 she noted scratchy throat, cough.  Today she noted both of these that had worsened, as well as an headache mostly in the frontal area.  She has not had fever.  She is not had vomiting, no rash.  No recent tick bites or other insect bites.  It is of note that she has a grandchild who is been sick recently.  Also her significant other is also been ill recently.  Patient has a history of asthma and bronchitis.  She presents now for assistance with these issues.  The history is provided by the patient.    Past Medical History:  Diagnosis Date  . Anemia   . Asthma   . Bronchitis   . Depression   . GERD (gastroesophageal reflux disease)   . Headache   . Hypertension     Patient Active Problem List   Diagnosis Date Noted  . Essential hypertension 12/10/2016  . Chronic obstructive pulmonary disease ([place]) 12/10/2016  . Cigarette nicotine dependence with nicotine-induced disorder 12/10/2016  . Chronic kidney disease 12/10/2016  . SOB (shortness of breath) 04/07/2013  . Migraine 04/07/2013    Past Surgical History:  Procedure Laterality Date  . BREAST SURGERY    . CESAREAN SECTION    . LEG SURGERY       OB History    Gravida  1   Para  1   Term  1   Preterm      AB      Living        SAB      TAB      Ectopic      Multiple      Live Births               Home Medications    Prior to Admission medications   Medication Sig Start Date End Date Taking? Authorizing Provider  albuterol (PROVENTIL HFA;VENTOLIN HFA) 108 (90 Base) MCG/ACT inhaler Inhale 2  puffs into the lungs every 6 (six) hours as needed for wheezing or shortness of breath. Patient taking differently: Inhale 1-2 puffs into the lungs every 6 (six) hours as needed for wheezing or shortness of breath.  07/28/17   Soyla Dryer, PA-C  albuterol (PROVENTIL) (2.5 MG/3ML) 0.083% nebulizer solution Take 3 mLs (2.5 mg total) by nebulization every 6 (six) hours as needed for wheezing or shortness of breath. 07/28/17   Soyla Dryer, PA-C  amLODipine (NORVASC) 10 MG tablet Take 1 tablet (10 mg total) by mouth daily. 12/23/17 03/04/18  Long, Wonda Olds, MD  clotrimazole (LOTRIMIN) 1 % cream Apply to affected area 2 times daily 12/31/17   Triplett, Tammy, PA-C  diphenhydrAMINE (BENADRYL) 25 mg capsule Take 25 mg by mouth daily as needed for itching or allergies.     [provider]  famotidine (PEPCID) 20 MG tablet Take 1 tablet (20 mg total) by mouth 2 (two) times daily. Patient not taking: Reported on 01/12/2018 12/18/17   Rolland Porter, MD  hydrochlorothiazide (HYDRODIURIL) 25 MG tablet Take 1 tablet (25 mg total) by  mouth daily. 12/11/17   Milton Ferguson, MD  hydrOXYzine (ATARAX/VISTARIL) 25 MG tablet Take 1 tablet (25 mg total) by mouth every 6 (six) hours. 03/04/18   Noemi Chapel, MD  meclizine (ANTIVERT) 12.5 MG tablet Take 1 tablet (12.5 mg total) by mouth 3 (three) times daily as needed for dizziness. 12/23/17   Long, Wonda Olds, MD    Family History Family History  Problem Relation Age of Onset  . Dermatomyositis Mother   . Hypertension Mother   . Cancer Mother        lung  . Cancer Father        lung  . Heart disease Father     Social History Social History   Tobacco Use  . Smoking status: Current Every Day Smoker    Packs/day: 1.00    Years: 23.00    Pack years: 23.00    Types: Cigarettes  . Smokeless tobacco: Never Used  Substance Use Topics  . Alcohol use: Yes    Comment: 1 beer daily   . Drug use: Yes    Types: Marijuana     Allergies     Lisinopril-hydrochlorothiazide; Ibuprofen; Tramadol; and Erythromycin   Review of Systems Review of Systems  Constitutional: Negative for activity change, chills and fever.       All ROS Neg except as noted in HPI  HENT: Positive for congestion, postnasal drip, sinus pressure and sore throat. Negative for nosebleeds.   Eyes: Negative for photophobia and discharge.  Respiratory: Positive for cough and wheezing. Negative for shortness of breath.   Cardiovascular: Negative for chest pain and palpitations.  Gastrointestinal: Negative for abdominal pain, blood in stool, diarrhea and vomiting.  Genitourinary: Negative for dysuria, frequency and hematuria.  Musculoskeletal: Negative for arthralgias, back pain and neck pain.  Skin: Negative.  Negative for rash.  Neurological: Negative for dizziness, seizures and speech difficulty.  Psychiatric/Behavioral: Negative for confusion and hallucinations.     Physical Exam Updated Vital Signs BP (!) 192/102 (BP Location: Right Arm)   Pulse 96   Temp 98.2 F (36.8 C) (Oral)   Resp 18   Ht 5' (1.524 m)   Wt 72.6 kg   LMP 05/13/2018   SpO2 100%   BMI 31.25 kg/m   Physical Exam  Constitutional: She is oriented to person, place, and time. She appears well-developed and well-nourished.  Non-toxic appearance.  HENT:  Head: Normocephalic.  Right Ear: Tympanic membrane and external ear normal.  Left Ear: Tympanic membrane and external ear normal.  Nasal congestion present.  Mild increased redness of the tonsillar areas.  Uvula is in the midline.  Airway is patent.  Eyes: Pupils are equal, round, and reactive to light. EOM and lids are normal.  Neck: Normal range of motion. Neck supple. Carotid bruit is not present.  Cardiovascular: Normal rate, regular rhythm, normal heart sounds, intact distal pulses and normal pulses.  Pulmonary/Chest: Breath sounds normal. No respiratory distress.  Abdominal: Soft. Bowel sounds are normal. There is no  tenderness. There is no guarding.  Musculoskeletal: Normal range of motion.  Lymphadenopathy:       Head (right side): No submandibular adenopathy present.       Head (left side): No submandibular adenopathy present.    She has no cervical adenopathy.  Neurological: She is alert and oriented to person, place, and time. She has normal strength. No cranial nerve deficit or sensory deficit. She exhibits normal muscle tone. Coordination normal.  Skin: Skin is warm and dry. No rash  noted.  Psychiatric: She has a normal mood and affect. Her speech is normal.  Nursing note and vitals reviewed.    ED Treatments / Results  Labs (all labs ordered are listed, but only abnormal results are displayed) Labs Reviewed - No data to display  EKG None  Radiology Dg Chest 2 View  Result Date: 05/26/2018 CLINICAL DATA:  Productive cough, sore throat headache since yesterday, history asthma, hypertension, bronchitis, GERD EXAM: CHEST - 2 VIEW COMPARISON:  01/12/2018 FINDINGS: Upper normal heart size. Mediastinal contours and pulmonary vascularity normal. Peribronchial thickening with slight diffuse accentuation of interstitial markings which could reflect bronchitis. No definite acute infiltrate, pleural effusion or pneumothorax. Bones unremarkable. IMPRESSION: Probable bronchitic changes without acute infiltrate. Electronically Signed   By: Lavonia Dana M.D.   On: 05/26/2018 17:36    Procedures Procedures (including critical care time)  Medications Ordered in ED Medications - No data to display   Initial Impression / Assessment and Plan / ED Course  I have reviewed the triage vital signs and the nursing notes.  Pertinent labs & imaging results that were available during my care of the patient were reviewed by me and considered in my medical decision making (see chart for details).       Final Clinical Impressions(s) / ED Diagnoses MDM Vital signs reviewed. Blood pressure is elevated at  192/102 on admission to the emergency department.  The patient states that she has run out of her current blood pressure medication.  She is not seeing a primary physician at this time.  No gross neurologic deficits appreciated on neurologic examination.  The patient speaks in complete sentences without problem.  The gait, coordination, and balance all seem to be within normal limits.  I suspect that the headache is related to her nasal congestion and sinus pressure.  The patient has a history of chronic bronchitis.  This seems to have been aggravated by the upper respiratory infection currently going on.  Chest x-ray shows bronchitis, but no pneumonia or other acute problem.  Patient states that she has albuterol at home.  We will add Decadron and Zithromax.  We will asked patient to use Afrin for nasal congestion for 5 days only.  Patient will be given a 30-day supply of hydrochlorothiazide.  Review of the previous records indicate that the patient has requested refill on blood pressure medication in the past.  I have strongly suggested to the patient that she contact a primary care physician or 1 of the local clinics to help her manage her blood pressure.  Also reviewed the danger of having this type of elevation in her blood pressure.  Patient acknowledges understanding of these instructions.   Final diagnoses:  Acute bronchitis, unspecified organism  Essential hypertension  Upper respiratory tract infection, unspecified type    ED Discharge Orders         Ordered    hydrochlorothiazide (HYDRODIURIL) 25 MG tablet  Daily     05/26/18 1809    cephALEXin (KEFLEX) 500 MG capsule  4 times daily     05/26/18 1809    predniSONE (DELTASONE) 20 MG tablet  Daily     05/26/18 1809           Lily Kocher, PA-C 05/26/18 1816    Francine Graven, DO 05/28/18 2156

## 2018-05-26 NOTE — ED Triage Notes (Signed)
Pt reports cough, sore throat, and headache since yesterday.   Denies fever.

## 2018-05-26 NOTE — Discharge Instructions (Addendum)
Your blood pressure is elevated at 192/102.  This is extremely dangerous to small vessels in your brain, eyes, heart, and kidney.  It is extremely important that you keep up with your blood pressure medication and your blood pressure.  Please see the physicians at the [facility] free clinic, the [facility] clinic, or Dr. Jani Gravel with the [facility] clinic to keep a close watch on your blood pressure.   Your examination favors bronchitis which is seen on your x-ray, complicated by an upper respiratory infection.  Please wash hands frequently.  Keep your distance from others.  Increase fluids.  Please use Afrin in each nostril 2 times daily for congestion.  Please only use this for 5 days.  Use prednisone daily with food.  Use Keflex with breakfast, lunch, dinner, and at bedtime until all taken.  Please see the physicians at the free clinic, or return to the emergency department if any changes in your condition, problems, or concerns.  It is important that you stop smoking.

## 2019-04-03 ENCOUNTER — Encounter (HOSPITAL_COMMUNITY): Payer: Self-pay | Admitting: Emergency Medicine

## 2019-04-03 ENCOUNTER — Emergency Department (HOSPITAL_COMMUNITY)
Admission: EM | Admit: 2019-04-03 | Discharge: 2019-04-03 | Disposition: A | Discharge status: Home / Self Care | Payer: Medicaid Other | Attending: Emergency Medicine | Admitting: Emergency Medicine

## 2019-04-03 ENCOUNTER — Other Ambulatory Visit: Payer: Self-pay

## 2019-04-03 DIAGNOSIS — Z5321 Procedure and treatment not carried out due to patient leaving prior to being seen by health care provider: Secondary | ICD-10-CM | POA: Insufficient documentation

## 2019-04-03 DIAGNOSIS — R109 Unspecified abdominal pain: Secondary | ICD-10-CM | POA: Insufficient documentation

## 2019-04-03 HISTORY — DX: Chronic obstructive pulmonary disease, unspecified: J44.9

## 2019-04-03 LAB — URINALYSIS, ROUTINE W REFLEX MICROSCOPIC
Bacteria, UA: NONE SEEN
Bilirubin Urine: NEGATIVE
Glucose, UA: NEGATIVE mg/dL
Ketones, ur: NEGATIVE mg/dL
Leukocytes,Ua: NEGATIVE
Nitrite: NEGATIVE
Protein, ur: NEGATIVE mg/dL
Specific Gravity, Urine: 1.003 — ABNORMAL LOW (ref 1.005–1.030)
pH: 6 (ref 5.0–8.0)

## 2019-04-03 LAB — PREGNANCY, URINE: Preg Test, Ur: NEGATIVE

## 2019-04-03 MED ORDER — SODIUM CHLORIDE 0.9% FLUSH: 3.0000 mL | Freq: Once | INTRAVENOUS | Status: DC

## 2019-04-03 NOTE — ED Triage Notes (Signed)
Patient c/o lower abd pain that radiates into lower back x3 days. Patient states nausea and vomiting. Denies any diarrhea, fevers, or urinary symptoms. Per patient last normal BM this morning. Denies any blood in stools.

## 2019-06-17 ENCOUNTER — Other Ambulatory Visit: Payer: Self-pay

## 2019-06-17 ENCOUNTER — Encounter (HOSPITAL_COMMUNITY): Payer: Self-pay

## 2019-06-17 ENCOUNTER — Emergency Department (HOSPITAL_COMMUNITY): Payer: Self-pay

## 2019-06-17 ENCOUNTER — Emergency Department (HOSPITAL_COMMUNITY)
Admission: EM | Admit: 2019-06-17 | Discharge: 2019-06-17 | Disposition: A | Discharge status: Home / Self Care | Payer: Self-pay | Attending: Emergency Medicine | Admitting: Emergency Medicine

## 2019-06-17 DIAGNOSIS — S0083XA Contusion of other part of head, initial encounter: Secondary | ICD-10-CM | POA: Insufficient documentation

## 2019-06-17 DIAGNOSIS — F17210 Nicotine dependence, cigarettes, uncomplicated: Secondary | ICD-10-CM | POA: Insufficient documentation

## 2019-06-17 DIAGNOSIS — S0990XA Unspecified injury of head, initial encounter: Secondary | ICD-10-CM

## 2019-06-17 DIAGNOSIS — Y929 Unspecified place or not applicable: Secondary | ICD-10-CM | POA: Insufficient documentation

## 2019-06-17 DIAGNOSIS — I129 Hypertensive chronic kidney disease with stage 1 through stage 4 chronic kidney disease, or unspecified chronic kidney disease: Secondary | ICD-10-CM | POA: Insufficient documentation

## 2019-06-17 DIAGNOSIS — J45909 Unspecified asthma, uncomplicated: Secondary | ICD-10-CM | POA: Insufficient documentation

## 2019-06-17 DIAGNOSIS — Y939 Activity, unspecified: Secondary | ICD-10-CM | POA: Insufficient documentation

## 2019-06-17 DIAGNOSIS — N189 Chronic kidney disease, unspecified: Secondary | ICD-10-CM | POA: Insufficient documentation

## 2019-06-17 DIAGNOSIS — Y999 Unspecified external cause status: Secondary | ICD-10-CM | POA: Insufficient documentation

## 2019-06-17 DIAGNOSIS — Z79899 Other long term (current) drug therapy: Secondary | ICD-10-CM | POA: Insufficient documentation

## 2019-06-17 DIAGNOSIS — J449 Chronic obstructive pulmonary disease, unspecified: Secondary | ICD-10-CM | POA: Insufficient documentation

## 2019-06-17 MED ORDER — ACETAMINOPHEN 500 MG PO TABS
1000.0000 mg | ORAL_TABLET | Freq: Once | ORAL | Status: AC
  Administered 2019-06-17: 1000 mg via ORAL
  Filled 2019-06-17: qty 2

## 2019-06-17 MED ORDER — AMLODIPINE BESYLATE 10 MG PO TABS: 10.0000 mg | ORAL_TABLET | Freq: Every day | ORAL | 0 refills | Status: DC

## 2019-06-17 MED ORDER — HYDROCHLOROTHIAZIDE 25 MG PO TABS: 25.0000 mg | ORAL_TABLET | Freq: Every day | ORAL | 0 refills | Status: DC
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
## 2019-06-17 NOTE — ED Provider Notes (Signed)
Signed out that d/c instructions complete, and to d/c to home when ct resulted.   CT neg for acute fx or hem.   Patient comfortable appearing, nad.      Lajean Saver, MD 06/17/19 [phone]
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
## 2019-06-17 NOTE — Discharge Instructions (Signed)
Use ice packs on the injured painful areas. You can take acetaminophen 650 mg 4 times a day for pain. Return to the ED for any problems listed on the head injury sheet. You need to stay in a safe place.

## 2019-12-27 ENCOUNTER — Emergency Department (HOSPITAL_COMMUNITY)
Admission: EM | Admit: 2019-12-27 | Discharge: 2019-12-27 | Discharge status: Against Medical Advice | Payer: Medicaid Other

## 2019-12-27 ENCOUNTER — Emergency Department (HOSPITAL_COMMUNITY)
Admission: EM | Admit: 2019-12-27 | Discharge: 2019-12-27 | Disposition: A | Discharge status: Home / Self Care | Payer: Medicaid Other
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
## 2020-01-31 ENCOUNTER — Other Ambulatory Visit: Payer: Self-pay

## 2020-01-31 ENCOUNTER — Encounter (HOSPITAL_COMMUNITY): Payer: Self-pay | Admitting: Emergency Medicine

## 2020-01-31 ENCOUNTER — Emergency Department (HOSPITAL_COMMUNITY)
Admission: EM | Admit: 2020-01-31 | Discharge: 2020-01-31 | Disposition: A | Discharge status: Home / Self Care | Payer: Medicaid Other | Attending: Emergency Medicine | Admitting: Emergency Medicine

## 2020-01-31 DIAGNOSIS — Y999 Unspecified external cause status: Secondary | ICD-10-CM | POA: Insufficient documentation

## 2020-01-31 DIAGNOSIS — Z5321 Procedure and treatment not carried out due to patient leaving prior to being seen by health care provider: Secondary | ICD-10-CM | POA: Insufficient documentation

## 2020-01-31 DIAGNOSIS — Y9389 Activity, other specified: Secondary | ICD-10-CM | POA: Insufficient documentation

## 2020-01-31 DIAGNOSIS — Y92410 Unspecified street and highway as the place of occurrence of the external cause: Secondary | ICD-10-CM | POA: Insufficient documentation

## 2020-01-31 DIAGNOSIS — M549 Dorsalgia, unspecified: Secondary | ICD-10-CM | POA: Insufficient documentation
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
## 2020-01-31 NOTE — ED Notes (Signed)
No answer for room placement.

## 2020-02-02 ENCOUNTER — Emergency Department (HOSPITAL_COMMUNITY): Payer: No Typology Code available for payment source

## 2020-02-02 ENCOUNTER — Emergency Department (HOSPITAL_COMMUNITY)
Admission: EM | Admit: 2020-02-02 | Discharge: 2020-02-02 | Disposition: A | Discharge status: Home / Self Care | Payer: No Typology Code available for payment source | Attending: Emergency Medicine | Admitting: Emergency Medicine

## 2020-02-02 ENCOUNTER — Other Ambulatory Visit: Payer: Self-pay

## 2020-02-02 ENCOUNTER — Encounter (HOSPITAL_COMMUNITY): Payer: Self-pay | Admitting: Emergency Medicine

## 2020-02-02 DIAGNOSIS — F17210 Nicotine dependence, cigarettes, uncomplicated: Secondary | ICD-10-CM | POA: Insufficient documentation

## 2020-02-02 DIAGNOSIS — J449 Chronic obstructive pulmonary disease, unspecified: Secondary | ICD-10-CM | POA: Insufficient documentation

## 2020-02-02 DIAGNOSIS — M5417 Radiculopathy, lumbosacral region: Secondary | ICD-10-CM | POA: Insufficient documentation

## 2020-02-02 DIAGNOSIS — M545 Low back pain: Secondary | ICD-10-CM | POA: Diagnosis present

## 2020-02-02 DIAGNOSIS — Z79899 Other long term (current) drug therapy: Secondary | ICD-10-CM | POA: Diagnosis not present

## 2020-02-02 DIAGNOSIS — N189 Chronic kidney disease, unspecified: Secondary | ICD-10-CM | POA: Diagnosis not present

## 2020-02-02 DIAGNOSIS — I129 Hypertensive chronic kidney disease with stage 1 through stage 4 chronic kidney disease, or unspecified chronic kidney disease: Secondary | ICD-10-CM | POA: Insufficient documentation

## 2020-02-02 DIAGNOSIS — I10 Essential (primary) hypertension: Secondary | ICD-10-CM

## 2020-02-02 LAB — URINALYSIS, ROUTINE W REFLEX MICROSCOPIC
Bacteria, UA: NONE SEEN
Bilirubin Urine: NEGATIVE
Glucose, UA: NEGATIVE mg/dL
Ketones, ur: NEGATIVE mg/dL
Leukocytes,Ua: NEGATIVE
Nitrite: NEGATIVE
Protein, ur: NEGATIVE mg/dL
Specific Gravity, Urine: 1.002 — ABNORMAL LOW (ref 1.005–1.030)
pH: 5 (ref 5.0–8.0)

## 2020-02-02 LAB — PREGNANCY, URINE: Preg Test, Ur: NEGATIVE

## 2020-02-02 MED ORDER — HYDROCODONE-ACETAMINOPHEN 5-325 MG PO TABS
2.0000 | ORAL_TABLET | Freq: Once | ORAL | Status: AC
  Administered 2020-02-02: 2 via ORAL
  Filled 2020-02-02: qty 2

## 2020-02-02 MED ORDER — AMLODIPINE BESYLATE 10 MG PO TABS
10.0000 mg | ORAL_TABLET | Freq: Every day | ORAL | 0 refills | Status: DC
Start: 2020-02-02 — End: 2022-08-31

## 2020-02-02 MED ORDER — HYDROCODONE-ACETAMINOPHEN 5-325 MG PO TABS: 1.0000 | ORAL_TABLET | Freq: Four times a day (QID) | ORAL | 0 refills | Status: DC | PRN
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
## 2020-02-02 NOTE — Discharge Instructions (Signed)

## 2020-02-02 NOTE — ED Triage Notes (Signed)
Pt C/o right sided back pain that started yesterday. Pt reports the back starts in her back and runs down the back of the right leg to her right knee.

## 2020-03-20 ENCOUNTER — Ambulatory Visit (INDEPENDENT_AMBULATORY_CARE_PROVIDER_SITE_OTHER): Payer: Medicaid Other | Admitting: Internal Medicine

## 2020-04-04 ENCOUNTER — Other Ambulatory Visit: Payer: Self-pay

## 2020-04-04 ENCOUNTER — Ambulatory Visit (INDEPENDENT_AMBULATORY_CARE_PROVIDER_SITE_OTHER): Payer: Medicaid Other | Admitting: Nurse Practitioner

## 2020-04-05 ENCOUNTER — Emergency Department (HOSPITAL_COMMUNITY)
Admission: EM | Admit: 2020-04-05 | Discharge: 2020-04-05 | Disposition: A | Discharge status: Home / Self Care | Payer: Medicaid Other | Attending: Emergency Medicine | Admitting: Emergency Medicine

## 2020-04-05 ENCOUNTER — Encounter (HOSPITAL_COMMUNITY): Payer: Self-pay | Admitting: Emergency Medicine

## 2020-04-05 ENCOUNTER — Other Ambulatory Visit: Payer: Self-pay

## 2020-04-05 DIAGNOSIS — R0981 Nasal congestion: Secondary | ICD-10-CM | POA: Insufficient documentation

## 2020-04-05 DIAGNOSIS — J029 Acute pharyngitis, unspecified: Secondary | ICD-10-CM | POA: Insufficient documentation

## 2020-04-05 DIAGNOSIS — R05 Cough: Secondary | ICD-10-CM | POA: Insufficient documentation

## 2020-04-05 DIAGNOSIS — Z5321 Procedure and treatment not carried out due to patient leaving prior to being seen by health care provider: Secondary | ICD-10-CM | POA: Insufficient documentation

## 2020-04-05 DIAGNOSIS — R519 Headache, unspecified: Secondary | ICD-10-CM | POA: Insufficient documentation

## 2020-04-05 NOTE — ED Notes (Signed)
Decided to leave prior to seeing a provider

## 2020-04-05 NOTE — ED Triage Notes (Signed)
Pt c/o productive cough, nasal congestion, headache, and sore throat x 3 days. States her grand kids had a recent cold.

## 2021-08-19 ENCOUNTER — Emergency Department (HOSPITAL_COMMUNITY): Payer: Self-pay

## 2021-08-19 ENCOUNTER — Emergency Department (HOSPITAL_COMMUNITY)
Admission: EM | Admit: 2021-08-19 | Discharge: 2021-08-19 | Disposition: A | Discharge status: Home / Self Care | Payer: Self-pay | Attending: Emergency Medicine | Admitting: Emergency Medicine

## 2021-08-19 ENCOUNTER — Other Ambulatory Visit: Payer: Self-pay

## 2021-08-19 ENCOUNTER — Encounter (HOSPITAL_COMMUNITY): Payer: Self-pay | Admitting: Emergency Medicine

## 2021-08-19 DIAGNOSIS — S0990XA Unspecified injury of head, initial encounter: Secondary | ICD-10-CM | POA: Insufficient documentation

## 2021-08-19 DIAGNOSIS — I129 Hypertensive chronic kidney disease with stage 1 through stage 4 chronic kidney disease, or unspecified chronic kidney disease: Secondary | ICD-10-CM | POA: Insufficient documentation

## 2021-08-19 DIAGNOSIS — J45909 Unspecified asthma, uncomplicated: Secondary | ICD-10-CM | POA: Insufficient documentation

## 2021-08-19 DIAGNOSIS — J449 Chronic obstructive pulmonary disease, unspecified: Secondary | ICD-10-CM | POA: Insufficient documentation

## 2021-08-19 DIAGNOSIS — Z79899 Other long term (current) drug therapy: Secondary | ICD-10-CM | POA: Insufficient documentation

## 2021-08-19 DIAGNOSIS — R000 Tachycardia, unspecified: Secondary | ICD-10-CM | POA: Insufficient documentation

## 2021-08-19 DIAGNOSIS — R079 Chest pain, unspecified: Secondary | ICD-10-CM | POA: Insufficient documentation

## 2021-08-19 DIAGNOSIS — N189 Chronic kidney disease, unspecified: Secondary | ICD-10-CM | POA: Insufficient documentation

## 2021-08-19 DIAGNOSIS — F17210 Nicotine dependence, cigarettes, uncomplicated: Secondary | ICD-10-CM | POA: Insufficient documentation

## 2021-08-19 DIAGNOSIS — I10 Essential (primary) hypertension: Secondary | ICD-10-CM | POA: Diagnosis not present

## 2021-08-19 LAB — BASIC METABOLIC PANEL
Anion gap: 10 (ref 5–15)
BUN: 12 mg/dL (ref 6–20)
CO2: 22 mmol/L (ref 22–32)
Calcium: 8.6 mg/dL — ABNORMAL LOW (ref 8.9–10.3)
Chloride: 107 mmol/L (ref 98–111)
Creatinine, Ser: 1.03 mg/dL — ABNORMAL HIGH (ref 0.44–1.00)
GFR, Estimated: >60 mL/min (ref >60)
Glucose, Bld: 81 mg/dL (ref 70–99)
Potassium: 3.8 mmol/L (ref 3.5–5.1)
Sodium: 139 mmol/L (ref 135–145)

## 2021-08-19 LAB — CBC
HCT: 38.8 % (ref 36.0–46.0)
Hemoglobin: 13 g/dL (ref 12.0–15.0)
MCH: 30.2 pg (ref 26.0–34.0)
MCHC: 33.5 g/dL (ref 30.0–36.0)
MCV: 90 fL (ref 80.0–100.0)
Platelets: 257 10*3/uL (ref 150–400)
RBC: 4.31 MIL/uL (ref 3.87–5.11)
RDW: 14.8 % (ref 11.5–15.5)
WBC: 8.7 10*3/uL (ref 4.0–10.5)
nRBC: 0 % (ref 0.0–0.2)

## 2021-08-19 LAB — TROPONIN I (HIGH SENSITIVITY)
Troponin I (High Sensitivity): 18 ng/L — ABNORMAL HIGH (ref <18)
Troponin I (High Sensitivity): 20 ng/L — ABNORMAL HIGH (ref <18)

## 2021-08-19 LAB — POC URINE PREG, ED: Preg Test, Ur: NEGATIVE

## 2021-08-19 MED ORDER — ACETAMINOPHEN 500 MG PO TABS
1000.0000 mg | ORAL_TABLET | Freq: Once | ORAL | Status: AC
  Administered 2021-08-19: 08:00:00 1000 mg via ORAL
  Filled 2021-08-19: qty 2
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
## 2021-08-19 NOTE — ED Notes (Signed)
Patient transported to CT 
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
## 2022-04-24 ENCOUNTER — Encounter (HOSPITAL_COMMUNITY): Payer: Self-pay | Admitting: Emergency Medicine

## 2022-04-24 ENCOUNTER — Emergency Department (HOSPITAL_COMMUNITY)
Admission: EM | Admit: 2022-04-24 | Discharge: 2022-04-24 | Disposition: A | Discharge status: Home / Self Care | Payer: Medicaid Other | Attending: Emergency Medicine | Admitting: Emergency Medicine

## 2022-04-24 ENCOUNTER — Other Ambulatory Visit: Payer: Self-pay

## 2022-04-24 DIAGNOSIS — Z5321 Procedure and treatment not carried out due to patient leaving prior to being seen by health care provider: Secondary | ICD-10-CM | POA: Insufficient documentation

## 2022-04-24 DIAGNOSIS — R109 Unspecified abdominal pain: Secondary | ICD-10-CM | POA: Insufficient documentation

## 2022-04-24 NOTE — ED Triage Notes (Signed)
Pt presents abdominal pain with abnormal bleeding x 3 months, doesn't have GYN.

## 2022-04-30 ENCOUNTER — Emergency Department (HOSPITAL_COMMUNITY)
Admission: EM | Admit: 2022-04-30 | Discharge: 2022-04-30 | Disposition: A | Discharge status: Home / Self Care | Payer: Medicaid Other | Attending: Emergency Medicine | Admitting: Emergency Medicine

## 2022-04-30 ENCOUNTER — Other Ambulatory Visit: Payer: Self-pay

## 2022-04-30 ENCOUNTER — Encounter (HOSPITAL_COMMUNITY): Payer: Self-pay | Admitting: Emergency Medicine

## 2022-04-30 DIAGNOSIS — N760 Acute vaginitis: Secondary | ICD-10-CM | POA: Insufficient documentation

## 2022-04-30 DIAGNOSIS — B9689 Other specified bacterial agents as the cause of diseases classified elsewhere: Secondary | ICD-10-CM

## 2022-04-30 DIAGNOSIS — R109 Unspecified abdominal pain: Secondary | ICD-10-CM | POA: Insufficient documentation

## 2022-04-30 LAB — HIV ANTIBODY (ROUTINE TESTING W REFLEX): HIV Screen 4th Generation wRfx: NONREACTIVE

## 2022-04-30 LAB — WET PREP, GENITAL
Sperm: NONE SEEN
Trich, Wet Prep: NONE SEEN
Yeast Wet Prep HPF POC: NONE SEEN

## 2022-04-30 LAB — URINALYSIS, ROUTINE W REFLEX MICROSCOPIC
Bacteria, UA: NONE SEEN
Bilirubin Urine: NEGATIVE
Glucose, UA: NEGATIVE mg/dL
Ketones, ur: NEGATIVE mg/dL
Leukocytes,Ua: NEGATIVE
Nitrite: NEGATIVE
Protein, ur: NEGATIVE mg/dL
Specific Gravity, Urine: 1.002 — ABNORMAL LOW (ref 1.005–1.030)
pH: 6 (ref 5.0–8.0)

## 2022-04-30 LAB — RPR
RPR Ser Ql: REACTIVE — AB
RPR Titer: 1:4 {titer}

## 2022-04-30 MED ORDER — ACETAMINOPHEN 500 MG PO TABS
1000.0000 mg | ORAL_TABLET | Freq: Once | ORAL | Status: AC
  Administered 2022-04-30: 1000 mg via ORAL
  Filled 2022-04-30: qty 2

## 2022-04-30 MED ORDER — METRONIDAZOLE 500 MG PO TABS: 500.0000 mg | ORAL_TABLET | Freq: Two times a day (BID) | ORAL | 0 refills | Status: DC

## 2022-04-30 MED ORDER — METRONIDAZOLE 500 MG PO TABS
500.0000 mg | ORAL_TABLET | Freq: Once | ORAL | Status: AC
  Administered 2022-04-30: 500 mg via ORAL
  Filled 2022-04-30: qty 1

## 2022-04-30 NOTE — Discharge Instructions (Addendum)
Your other tests will result in 1-2 days. We will call you if positive and call you in medication if needed. If you don't hear anything, they are normal and only take the Flagyl prescribed today.

## 2022-04-30 NOTE — ED Triage Notes (Signed)
Pt c/o left flank pain. She states after she urinates she bleeds and after she has sex she has vaginal bleeding. Pt also states she has an odor of her vagina.

## 2022-04-30 NOTE — ED Notes (Addendum)
Negative pregnancy test performed by Carron Curie, RN

## 2022-05-01 LAB — GC/CHLAMYDIA PROBE AMP (~~LOC~~) NOT AT ARMC
Chlamydia: NEGATIVE
Comment: NEGATIVE
Comment: NORMAL
Neisseria Gonorrhea: NEGATIVE

## 2022-05-01 LAB — T.PALLIDUM AB, TOTAL: T Pallidum Abs: REACTIVE — AB
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
## 2022-08-01 DIAGNOSIS — Z419 Encounter for procedure for purposes other than remedying health state, unspecified: Secondary | ICD-10-CM | POA: Diagnosis not present

## 2022-08-28 ENCOUNTER — Emergency Department (HOSPITAL_COMMUNITY): Payer: Medicaid Other

## 2022-08-28 ENCOUNTER — Encounter (HOSPITAL_COMMUNITY): Payer: Self-pay | Admitting: *Deleted

## 2022-08-28 ENCOUNTER — Other Ambulatory Visit: Payer: Self-pay

## 2022-08-28 ENCOUNTER — Inpatient Hospital Stay (HOSPITAL_COMMUNITY)
Admission: EM | Admit: 2022-08-28 | Discharge: 2022-08-31 | DRG: 682 | Disposition: A | Discharge status: Home / Self Care | Payer: Medicaid Other | Attending: Internal Medicine | Admitting: Internal Medicine

## 2022-08-28 DIAGNOSIS — C7989 Secondary malignant neoplasm of other specified sites: Secondary | ICD-10-CM | POA: Diagnosis not present

## 2022-08-28 DIAGNOSIS — U071 COVID-19: Secondary | ICD-10-CM | POA: Insufficient documentation

## 2022-08-28 DIAGNOSIS — R1900 Intra-abdominal and pelvic swelling, mass and lump, unspecified site: Secondary | ICD-10-CM | POA: Insufficient documentation

## 2022-08-28 DIAGNOSIS — I129 Hypertensive chronic kidney disease with stage 1 through stage 4 chronic kidney disease, or unspecified chronic kidney disease: Secondary | ICD-10-CM | POA: Diagnosis present

## 2022-08-28 DIAGNOSIS — N179 Acute kidney failure, unspecified: Secondary | ICD-10-CM | POA: Diagnosis not present

## 2022-08-28 DIAGNOSIS — Z881 Allergy status to other antibiotic agents status: Secondary | ICD-10-CM

## 2022-08-28 DIAGNOSIS — K219 Gastro-esophageal reflux disease without esophagitis: Secondary | ICD-10-CM | POA: Diagnosis present

## 2022-08-28 DIAGNOSIS — Z888 Allergy status to other drugs, medicaments and biological substances status: Secondary | ICD-10-CM | POA: Diagnosis not present

## 2022-08-28 DIAGNOSIS — E876 Hypokalemia: Secondary | ICD-10-CM | POA: Insufficient documentation

## 2022-08-28 DIAGNOSIS — R110 Nausea: Secondary | ICD-10-CM | POA: Diagnosis not present

## 2022-08-28 DIAGNOSIS — Z801 Family history of malignant neoplasm of trachea, bronchus and lung: Secondary | ICD-10-CM | POA: Diagnosis not present

## 2022-08-28 DIAGNOSIS — R000 Tachycardia, unspecified: Secondary | ICD-10-CM | POA: Diagnosis not present

## 2022-08-28 DIAGNOSIS — R197 Diarrhea, unspecified: Secondary | ICD-10-CM | POA: Insufficient documentation

## 2022-08-28 DIAGNOSIS — N189 Chronic kidney disease, unspecified: Secondary | ICD-10-CM | POA: Diagnosis not present

## 2022-08-28 DIAGNOSIS — I10 Essential (primary) hypertension: Secondary | ICD-10-CM | POA: Diagnosis present

## 2022-08-28 DIAGNOSIS — N930 Postcoital and contact bleeding: Secondary | ICD-10-CM | POA: Diagnosis not present

## 2022-08-28 DIAGNOSIS — J449 Chronic obstructive pulmonary disease, unspecified: Secondary | ICD-10-CM | POA: Diagnosis not present

## 2022-08-28 DIAGNOSIS — Z79899 Other long term (current) drug therapy: Secondary | ICD-10-CM | POA: Diagnosis not present

## 2022-08-28 DIAGNOSIS — Z886 Allergy status to analgesic agent status: Secondary | ICD-10-CM | POA: Diagnosis not present

## 2022-08-28 DIAGNOSIS — R059 Cough, unspecified: Secondary | ICD-10-CM | POA: Diagnosis not present

## 2022-08-28 DIAGNOSIS — E869 Volume depletion, unspecified: Secondary | ICD-10-CM | POA: Insufficient documentation

## 2022-08-28 DIAGNOSIS — R531 Weakness: Secondary | ICD-10-CM | POA: Diagnosis not present

## 2022-08-28 DIAGNOSIS — Z8249 Family history of ischemic heart disease and other diseases of the circulatory system: Secondary | ICD-10-CM

## 2022-08-28 DIAGNOSIS — Z91018 Allergy to other foods: Secondary | ICD-10-CM

## 2022-08-28 DIAGNOSIS — F17219 Nicotine dependence, cigarettes, with unspecified nicotine-induced disorders: Secondary | ICD-10-CM | POA: Diagnosis not present

## 2022-08-28 DIAGNOSIS — K760 Fatty (change of) liver, not elsewhere classified: Secondary | ICD-10-CM | POA: Diagnosis not present

## 2022-08-28 DIAGNOSIS — C801 Malignant (primary) neoplasm, unspecified: Secondary | ICD-10-CM | POA: Diagnosis not present

## 2022-08-28 DIAGNOSIS — R6889 Other general symptoms and signs: Secondary | ICD-10-CM | POA: Diagnosis not present

## 2022-08-28 DIAGNOSIS — N2889 Other specified disorders of kidney and ureter: Secondary | ICD-10-CM | POA: Diagnosis not present

## 2022-08-28 DIAGNOSIS — N390 Urinary tract infection, site not specified: Secondary | ICD-10-CM | POA: Diagnosis present

## 2022-08-28 DIAGNOSIS — Z743 Need for continuous supervision: Secondary | ICD-10-CM | POA: Diagnosis not present

## 2022-08-28 DIAGNOSIS — J441 Chronic obstructive pulmonary disease with (acute) exacerbation: Secondary | ICD-10-CM | POA: Diagnosis present

## 2022-08-28 DIAGNOSIS — N3289 Other specified disorders of bladder: Secondary | ICD-10-CM | POA: Diagnosis not present

## 2022-08-28 LAB — URINALYSIS, ROUTINE W REFLEX MICROSCOPIC
Glucose, UA: NEGATIVE mg/dL
Ketones, ur: 5 mg/dL — AB
Nitrite: NEGATIVE
Protein, ur: >=300 mg/dL — AB
RBC / HPF: >50 RBC/hpf — ABNORMAL HIGH (ref 0–5)
Specific Gravity, Urine: 1.031 — ABNORMAL HIGH (ref 1.005–1.030)
WBC, UA: >50 WBC/hpf — ABNORMAL HIGH (ref 0–5)
pH: 5 (ref 5.0–8.0)

## 2022-08-28 LAB — CBC WITH DIFFERENTIAL/PLATELET
Abs Immature Granulocytes: 0.19 10*3/uL — ABNORMAL HIGH (ref 0.00–0.07)
Basophils Absolute: 0.1 10*3/uL (ref 0.0–0.1)
Basophils Relative: 0 %
Eosinophils Absolute: 0.1 10*3/uL (ref 0.0–0.5)
Eosinophils Relative: 0 %
HCT: 37.9 % (ref 36.0–46.0)
Hemoglobin: 12.3 g/dL (ref 12.0–15.0)
Immature Granulocytes: 1 %
Lymphocytes Relative: 6 %
Lymphs Abs: 1.4 10*3/uL (ref 0.7–4.0)
MCH: 25.9 pg — ABNORMAL LOW (ref 26.0–34.0)
MCHC: 32.5 g/dL (ref 30.0–36.0)
MCV: 80 fL (ref 80.0–100.0)
Monocytes Absolute: 1.8 10*3/uL — ABNORMAL HIGH (ref 0.1–1.0)
Monocytes Relative: 8 %
Neutro Abs: 18.7 10*3/uL — ABNORMAL HIGH (ref 1.7–7.7)
Neutrophils Relative %: 85 %
Platelets: 308 10*3/uL (ref 150–400)
RBC: 4.74 MIL/uL (ref 3.87–5.11)
RDW: 16.5 % — ABNORMAL HIGH (ref 11.5–15.5)
WBC: 22.3 10*3/uL — ABNORMAL HIGH (ref 4.0–10.5)
nRBC: 0 % (ref 0.0–0.2)

## 2022-08-28 LAB — COMPREHENSIVE METABOLIC PANEL
ALT: 21 U/L (ref 0–44)
AST: 32 U/L (ref 15–41)
Albumin: 3.5 g/dL (ref 3.5–5.0)
Alkaline Phosphatase: 69 U/L (ref 38–126)
Anion gap: 14 (ref 5–15)
BUN: 22 mg/dL — ABNORMAL HIGH (ref 6–20)
CO2: 22 mmol/L (ref 22–32)
Calcium: 8.9 mg/dL (ref 8.9–10.3)
Chloride: 95 mmol/L — ABNORMAL LOW (ref 98–111)
Creatinine, Ser: 2.61 mg/dL — ABNORMAL HIGH (ref 0.44–1.00)
GFR, Estimated: 22 mL/min — ABNORMAL LOW (ref >60)
Glucose, Bld: 114 mg/dL — ABNORMAL HIGH (ref 70–99)
Potassium: 3.2 mmol/L — ABNORMAL LOW (ref 3.5–5.1)
Sodium: 131 mmol/L — ABNORMAL LOW (ref 135–145)
Total Bilirubin: 0.6 mg/dL (ref 0.3–1.2)
Total Protein: 8.9 g/dL — ABNORMAL HIGH (ref 6.5–8.1)

## 2022-08-28 LAB — RESP PANEL BY RT-PCR (RSV, FLU A&B, COVID)  RVPGX2
Influenza A by PCR: NEGATIVE
Influenza B by PCR: NEGATIVE
Resp Syncytial Virus by PCR: NEGATIVE
SARS Coronavirus 2 by RT PCR: POSITIVE — AB

## 2022-08-28 LAB — CBG MONITORING, ED: Glucose-Capillary: 129 mg/dL — ABNORMAL HIGH (ref 70–99)

## 2022-08-28 LAB — CK: Total CK: 41 U/L (ref 38–234)

## 2022-08-28 LAB — LIPASE, BLOOD: Lipase: 22 U/L (ref 11–51)

## 2022-08-28 LAB — POC URINE PREG, ED: Preg Test, Ur: NEGATIVE

## 2022-08-28 MED ORDER — LACTATED RINGERS IV BOLUS
1000.0000 mL | Freq: Once | INTRAVENOUS | Status: AC
  Administered 2022-08-28: 1000 mL via INTRAVENOUS

## 2022-08-28 MED ORDER — PANTOPRAZOLE SODIUM 40 MG PO TBEC
40.0000 mg | DELAYED_RELEASE_TABLET | Freq: Every day | ORAL | Status: DC
  Administered 2022-08-29 – 2022-08-31 (×3): 40 mg via ORAL
  Filled 2022-08-28 (×3): qty 1

## 2022-08-28 MED ORDER — ACETAMINOPHEN 650 MG RE SUPP: 650.0000 mg | Freq: Four times a day (QID) | RECTAL | Status: DC | PRN

## 2022-08-28 MED ORDER — POTASSIUM CHLORIDE CRYS ER 20 MEQ PO TBCR
40.0000 meq | EXTENDED_RELEASE_TABLET | Freq: Once | ORAL | Status: AC
  Administered 2022-08-28: 40 meq via ORAL
  Filled 2022-08-28: qty 2

## 2022-08-28 MED ORDER — HYDROMORPHONE HCL 1 MG/ML IJ SOLN
0.5000 mg | INTRAMUSCULAR | Status: DC | PRN
  Administered 2022-08-28 – 2022-08-31 (×12): 0.5 mg via INTRAVENOUS
  Filled 2022-08-28 (×12): qty 0.5

## 2022-08-28 MED ORDER — ALBUTEROL SULFATE (2.5 MG/3ML) 0.083% IN NEBU: 2.5000 mg | INHALATION_SOLUTION | RESPIRATORY_TRACT | Status: DC | PRN

## 2022-08-28 MED ORDER — ALUM & MAG HYDROXIDE-SIMETH 200-200-20 MG/5ML PO SUSP: 30.0000 mL | Freq: Four times a day (QID) | ORAL | Status: DC | PRN

## 2022-08-28 MED ORDER — ONDANSETRON HCL 4 MG PO TABS: 4.0000 mg | ORAL_TABLET | Freq: Four times a day (QID) | ORAL | Status: DC | PRN

## 2022-08-28 MED ORDER — FOLIC ACID 1 MG PO TABS
1.0000 mg | ORAL_TABLET | Freq: Every day | ORAL | Status: DC
  Administered 2022-08-28 – 2022-08-31 (×4): 1 mg via ORAL
  Filled 2022-08-28 (×4): qty 1

## 2022-08-28 MED ORDER — HYDRALAZINE HCL 20 MG/ML IJ SOLN: 10.0000 mg | Freq: Four times a day (QID) | INTRAMUSCULAR | Status: DC | PRN

## 2022-08-28 MED ORDER — HEPARIN SODIUM (PORCINE) 5000 UNIT/ML IJ SOLN
5000.0000 [IU] | Freq: Three times a day (TID) | INTRAMUSCULAR | Status: DC
  Administered 2022-08-28 – 2022-08-31 (×9): 5000 [IU] via SUBCUTANEOUS
  Filled 2022-08-28 (×9): qty 1

## 2022-08-28 MED ORDER — ONDANSETRON HCL 4 MG/2ML IJ SOLN: 4.0000 mg | Freq: Four times a day (QID) | INTRAMUSCULAR | Status: DC | PRN

## 2022-08-28 MED ORDER — SODIUM CHLORIDE 0.9 % IV SOLN
1.0000 g | INTRAVENOUS | Status: AC
  Administered 2022-08-29 – 2022-08-30 (×2): 1 g via INTRAVENOUS
  Filled 2022-08-28 (×2): qty 10

## 2022-08-28 MED ORDER — ONDANSETRON HCL 4 MG/2ML IJ SOLN
4.0000 mg | Freq: Once | INTRAMUSCULAR | Status: AC
  Administered 2022-08-28: 4 mg via INTRAVENOUS
  Filled 2022-08-28: qty 2

## 2022-08-28 MED ORDER — SODIUM CHLORIDE 0.9 % IV SOLN
1.0000 g | Freq: Once | INTRAVENOUS | Status: AC
  Administered 2022-08-28: 1 g via INTRAVENOUS
  Filled 2022-08-28: qty 10

## 2022-08-28 MED ORDER — ACETAMINOPHEN 325 MG PO TABS
650.0000 mg | ORAL_TABLET | Freq: Four times a day (QID) | ORAL | Status: DC | PRN
  Administered 2022-08-30 – 2022-08-31 (×2): 650 mg via ORAL
  Filled 2022-08-28 (×2): qty 2

## 2022-08-28 MED ORDER — MELATONIN 3 MG PO TABS
9.0000 mg | ORAL_TABLET | Freq: Every evening | ORAL | Status: DC | PRN
  Administered 2022-08-28 – 2022-08-30 (×2): 9 mg via ORAL
  Filled 2022-08-28 (×2): qty 3

## 2022-08-28 MED ORDER — NICOTINE 14 MG/24HR TD PT24
14.0000 mg | MEDICATED_PATCH | Freq: Every day | TRANSDERMAL | Status: DC
  Administered 2022-08-28 – 2022-08-31 (×4): 14 mg via TRANSDERMAL
  Filled 2022-08-28 (×4): qty 1

## 2022-08-28 MED ORDER — SODIUM CHLORIDE 0.9 % IV SOLN: INTRAVENOUS | Status: DC

## 2022-08-28 MED ORDER — THIAMINE MONONITRATE 100 MG PO TABS
100.0000 mg | ORAL_TABLET | Freq: Every day | ORAL | Status: DC
  Administered 2022-08-28 – 2022-08-31 (×4): 100 mg via ORAL
  Filled 2022-08-28 (×4): qty 1

## 2022-08-28 MED ORDER — ADULT MULTIVITAMIN W/MINERALS CH
1.0000 | ORAL_TABLET | Freq: Every day | ORAL | Status: DC
  Administered 2022-08-28 – 2022-08-31 (×4): 1 via ORAL
  Filled 2022-08-28 (×4): qty 1

## 2022-08-28 MED ORDER — AMLODIPINE BESYLATE 5 MG PO TABS
10.0000 mg | ORAL_TABLET | Freq: Every day | ORAL | Status: DC
  Administered 2022-08-29 – 2022-08-31 (×3): 10 mg via ORAL
  Filled 2022-08-28 (×3): qty 2

## 2022-08-28 MED ORDER — SODIUM CHLORIDE 0.9 % IV BOLUS
1000.0000 mL | Freq: Once | INTRAVENOUS | Status: AC
  Administered 2022-08-28: 1000 mL via INTRAVENOUS

## 2022-08-28 MED ORDER — HYDROMORPHONE HCL 1 MG/ML IJ SOLN
1.0000 mg | Freq: Once | INTRAMUSCULAR | Status: AC
  Administered 2022-08-28: 1 mg via INTRAVENOUS
  Filled 2022-08-28: qty 1

## 2022-08-28 MED ORDER — ALUM & MAG HYDROXIDE-SIMETH 200-200-20 MG/5ML PO SUSP
30.0000 mL | Freq: Once | ORAL | Status: AC
  Administered 2022-08-28: 30 mL via ORAL
  Filled 2022-08-28: qty 30

## 2022-08-28 NOTE — ED Notes (Signed)
Dinner tray given

## 2022-08-28 NOTE — H&P (Addendum)
[facility] History and Physical  Kristin Carson HFW:[id number] DOB: [birth year] DOA: 08/28/2022  Referring physician: ED  PCP: Pcp, No   Patient is coming from: Home  Chief Complaint: Diarrhea  HPI: Patient is a 51 years old female with past medical history of COPD, active smoker, depression, GERD, hypertension presented to hospital with diarrhea and nausea for the last 3 to 4 days multiple watery stools without any blood.  Patient also complains of cough and congestion.  Of note patient has been experiencing on and off postcoital bleeding for the last 30 days with abdominal discomfort.  She stated that she has not been to the doctor and comes to the hospital for any medical needs.  Has not had any screening test done in the past.  She was not vaccinated for COVID.  Stated that she continues to smoke and drink some.  Denied any sick contacts.  Patient denies any urinary urgency, frequency or dysuria.  Denies any chest pain, dizziness, lightheadedness or syncope.  In the ED, patient was noted to be mildly hypotensive with tachypnea and tachycardia initially.  Temperature was 98.9.  Initial labs showed leukocytosis with WBC at 22.3.  Pregnancy test was negative.  CK level 41.  Lipase was 22.  BMP showed hypokalemia with potassium of 3.2 and creatinine was elevated at 2.6 from a baseline around 1.0.  LFTs were within normal limits.  Patient tested positive for COVID but negative for influenza and RSV.  CT scan of the abdomen pelvis done in the ED showed multiple findings including abdominal pelvic adenopathy, possible peritoneal carcinomatosis, new bibasilar pulmonary nodules without any obvious source but the scan was without contrast.  Patient was then considered for admission to hospital for acute kidney injury, volume depletion,  Assessment and Plan  Diarrhea and nausea abdominal discomfort could be secondary to COVID-19 infection.  Continue with IV hydration, symptomatic care with  antiemetics.  Check stool for pathogen panel, C. difficile,  Significant leukocytosis could be reactive/secondary to UTI.  Urine sample pending.  Will continue with empiric Rocephin.  CT scan shows bladder wall thickening  COVID-19 infection.  Provide supportive care.  Add loperamide if continues to have diarrhea if C. difficile and GI pathogen panel negative.  No respiratory symptoms except cough no hypoxia.  Acute kidney injury.  Creatinine today at 2.5 baseline around 1.0.  Likely secondary to diarrhea and nausea poor oral intake.  Will continue with IV fluid hydration.  Received 2 L of IV fluid in the ED.  Intermittent abdominal pain, postcoital bleeding.  Patient has not had any vaginal exam done in the past.  Possibility of uterine cancer with metastasis.  Unclear at this time due to lack of contrast.  Will repeat her scan with contrast when renal failure improved.  Active cigarette smoking.  Will put the patient on nicotine patch.  Counseled to quit.  History of GERD.  Continue PPI.  Essential hypertension. On amlodipine at home.  Will continue.  Closely monitor blood pressure.  Was slightly hypotensive on presentation which has improved at this time.   DVT Prophylaxis: Heparin subcu  Review of Systems:  All systems were reviewed and were negative unless otherwise mentioned in the HPI   Past Medical History:  Diagnosis Date   Anemia    Asthma    Bronchitis    COPD (chronic obstructive pulmonary disease) (HCC)    Depression    GERD (gastroesophageal reflux disease)    Headache    Hypertension  Past Surgical History:  Procedure Laterality Date   BREAST SURGERY     CESAREAN SECTION     LEG SURGERY      Social History:  reports that she has been smoking cigarettes. She has a 23.00 pack-year smoking history. She has never used smokeless tobacco. She reports current alcohol use. She reports current drug use. Drug: Marijuana.  Allergies  Allergen Reactions    Lisinopril-Hydrochlorothiazide     Oral swelling   Ibuprofen Other (See Comments)    Oral swelling   Tramadol Hives   Erythromycin Hives    Family History  Problem Relation Age of Onset   Dermatomyositis Mother    Hypertension Mother    Cancer Mother        lung   Cancer Father        lung   Heart disease Father      Prior to Admission medications   Medication Sig Start Date End Date Taking? Authorizing Provider  amLODipine (NORVASC) 10 MG tablet Take 1 tablet (10 mg total) by mouth daily. Patient not taking: Reported on 08/28/2022 02/02/20   Ripley Fraise, MD  metroNIDAZOLE (FLAGYL) 500 MG tablet Take 1 tablet (500 mg total) by mouth 2 (two) times daily. One po bid x 7 days Patient not taking: Reported on 08/28/2022 04/30/22   Mesner, Corene Cornea, MD  famotidine (PEPCID) 20 MG tablet Take 1 tablet (20 mg total) by mouth 2 (two) times daily. Patient not taking: Reported on 01/12/2018 12/18/17 02/02/20  Rolland Porter, MD  hydrochlorothiazide (HYDRODIURIL) 25 MG tablet Take 1 tablet (25 mg total) by mouth daily. 06/17/19 02/02/20  Rolland Porter, MD    Physical Exam: Vitals:   08/28/22 1345 08/28/22 1355 08/28/22 1400 08/28/22 1415  BP:      Pulse:  (!) 109 (!) 118 (!) 103  Resp: 15     Temp:      TempSrc:      SpO2:  100% 100% 100%  Weight:       Wt Readings from Last 3 Encounters:  08/28/22 76.7 kg  04/30/22 77 kg  08/19/21 81.6 kg   Body mass index is 28.12 kg/m.  General:  Average built, not in obvious distress, alert awake and Communicative HENT: Normocephalic, No scleral pallor or icterus noted. Oral mucosa is dry Chest:  Clear breath sounds.  . No crackles or wheezes.  CVS: S1 &S2 heard. No murmur.  Regular rate and rhythm. Abdomen: Soft, nontender, nondistended.  Bowel sounds are heard.  Extremities: No cyanosis, clubbing or edema.  Peripheral pulses are palpable. Psych: Alert, awake and oriented, normal mood CNS:  No cranial nerve deficits.  Power equal in all extremities.    Skin: Warm and dry.  No rashes noted.  Labs on Admission:   CBC: Recent Labs  Lab 08/28/22 1111  WBC 22.3*  NEUTROABS 18.7*  HGB 12.3  HCT 37.9  MCV 80.0  PLT 099    Basic Metabolic Panel: Recent Labs  Lab 08/28/22 1111  NA 131*  K 3.2*  CL 95*  CO2 22  GLUCOSE 114*  BUN 22*  CREATININE 2.61*  CALCIUM 8.9    Liver Function Tests: Recent Labs  Lab 08/28/22 1111  AST 32  ALT 21  ALKPHOS 69  BILITOT 0.6  PROT 8.9*  ALBUMIN 3.5   Recent Labs  Lab 08/28/22 1111  LIPASE 22   No results for input(s): "AMMONIA" in the last 168 hours.  Cardiac Enzymes: Recent Labs  Lab 08/28/22 1111  CKTOTAL  41    BNP (last 3 results) No results for input(s): "BNP" in the last 8760 hours.  ProBNP (last 3 results) No results for input(s): "PROBNP" in the last 8760 hours.  CBG: Recent Labs  Lab 08/28/22 1046  GLUCAP 129*    Lipase     Component Value Date/Time   LIPASE 22 08/28/2022 1111     Radiological Exams on Admission: CT ABDOMEN PELVIS WO CONTRAST  Result Date: 08/28/2022 CLINICAL DATA:  Abdominal pain. Diarrhea. COVID positive. Weakness. Nausea. Smoker with COPD. * Tracking Code: BO * EXAM: CT ABDOMEN AND PELVIS WITHOUT CONTRAST TECHNIQUE: Multidetector CT imaging of the abdomen and pelvis was performed following the standard protocol without IV contrast. RADIATION DOSE REDUCTION: This exam was performed according to the departmental dose-optimization program which includes automated exposure control, adjustment of the mA and/or kV according to patient size and/or use of iterative reconstruction technique. COMPARISON:  Chest CT 01/12/2018. Abdominopelvic CT from [facility] [place] 04/03/2019, report only FINDINGS: Lower chest: Centrilobular emphysema. Bilateral subpleural predominant pulmonary nodules. A lingular 8 mm nodule on 01/04 is new since 01/12/2018. A right middle lobe 5 mm nodule on 08/04 is new since 01/12/2018 as is a 1.2 cm right middle lobe  nodule on 14/4. Mild cardiomegaly, without pericardial or pleural effusion. Hepatobiliary: Moderate hepatic steatosis. Caudate and lateral segment left liver lobe enlargement. More focal hypoattenuation adjacent to falciform ligament is likely due to focal steatosis. Normal gallbladder, without biliary ductal dilatation. Pancreas: Normal, without mass or ductal dilatation. Spleen: Normal in size, without focal abnormality. Adrenals/Urinary Tract: Normal adrenal glands. No renal calculi or hydronephrosis. No hydroureter or ureteric calculi. No bladder calculi. The bladder is thick walled with mild pericystic edema on 75/2. Stomach/Bowel: Normal stomach, without wall thickening. Fluid-filled colon suggests a diarrheal state. Normal terminal ileum. The appendix is mildly dilated including up to 1.0 cm on 49/5. Normal small bowel. Vascular/Lymphatic: Aortic atherosclerosis. Abdominal retroperitoneal adenopathy. 1.5 cm left para-aortic node on 30/2. Aortocaval nodes of up to 1.1 cm on 42/2. Left external iliac 1.0 cm node on 56/2. Right common iliac 1.3 cm node on 47/2. Reproductive: Equivocal soft tissue fullness within the uterine cervix on 71/2. No dominant adnexal mass, given limitation of noncontrast technique. Other: No significant free fluid.  No free intraperitoneal air. 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 Musculoskeletal: Remote lower left rib fractures. IMPRESSION: 1. Constellation of findings which is most consistent with metastatic disease, including abdominopelvic adenopathy, peritoneal carcinomatosis, and new bibasilar pulmonary nodules. 2. No definite primary source identified given limitations of noncontrast stone study technique. There is equivocal soft tissue fullness within the uterine cervix and the appendix is mildly dilated without significant surrounding inflammation. These are both potential sites of primary which could  be further evaluated via multidisciplinary oncology consultation for eventual contrast-enhanced chest abdomen and pelvic CT and/or PET. 3. Hepatic steatosis with probable cirrhosis. 4. Bladder wall thickening and pericystic edema, suggesting cystitis. 5. Fluid-filled colon suggests a diarrheal state. 6.  Aortic Atherosclerosis (ICD10-I70.0).  Emphysema (ICD10-J43.9). Electronically Signed   By: Abigail Miyamoto M.D.   On: 08/28/2022 13:51   DG Chest Portable 1 View  Result Date: 08/28/2022 CLINICAL DATA:  Cough EXAM: PORTABLE CHEST 1 VIEW COMPARISON:  08/19/2021 FINDINGS: Heart size and vascularity normal. Negative for heart failure. Negative for infiltrate or effusion Possible 1 cm nodule right lung base overlying the diaphragm, not seen on prior studies. IMPRESSION: Possible 1 cm  nodule right lung base. Follow-up chest x-ray versus chest CT suggested. Electronically Signed   By: Franchot Gallo M.D.   On: 08/28/2022 11:36    EKG: Not available for review   Consultant: None  Code Status: Full code  Microbiology urine culture  Antibiotics: Rocephin IV  Family Communication:  Patients' condition and plan of care including tests being ordered have been discussed with the patient  who indicate understanding and agree with the plan.   Status is: Inpatient  Remains inpatient appropriate because: Acute kidney injury, volume depletion, abdominal mass, COVID-19 infection   Severity of Illness: The appropriate patient status for this patient is INPATIENT. Inpatient status is judged to be reasonable and necessary in order to provide the required intensity of service to ensure the patient's safety. The patient's presenting symptoms, physical exam findings, and initial radiographic and laboratory data in the context of their chronic comorbidities is felt to place them at high risk for further clinical deterioration. Furthermore, it is not anticipated that the patient will be medically stable for discharge  from the hospital within 2 midnights of admission.  I certify that at the point of admission it is my clinical judgment that the patient will require inpatient hospital care spanning beyond 2 midnights from the point of admission due to high intensity of service, high risk for further deterioration and high frequency of surveillance required.*  Signed, Flora Lipps, MD [facility] 08/28/2022

## 2022-08-28 NOTE — ED Triage Notes (Signed)
BIB [facility] for ILI: general weakness, nausea, diarrhea, and HA, verbalizes some hematuria or bleeding noted. Denies fever, syncope or vomiting. HR noted to be ST 145-150. BS 135. Alert, NAD, calm, interactive. Mentions chronic low abd pain w/o change from usual, but rates 7/10.

## 2022-08-28 NOTE — ED Notes (Signed)
Up to b/r, steady gait, reports bleeding, given pad per request. Denies pain or nausea. States, LMP ~11/15.

## 2022-08-28 NOTE — ED Provider Notes (Signed)
[facility] EMERGENCY DEPARTMENT Provider Note   CSN: [id number] Arrival date & time: 08/28/22  1021     History  Chief Complaint  Patient presents with   Diarrhea    Kristin Carson is a 51 y.o. female.  HPI 51 year old female presents with lower abdominal pain.  She has been feeling poorly for about 3 days.  Overall she has been having about 30 days of on and off lower abdominal pain.  She states sometimes she will have vaginal bleeding after having intercourse with her husband.  She is also been having intermittent hematuria.  Over the last 3 days however she is felt even worse with worse abdominal pain, profuse watery diarrhea, nausea, cough, headache and bodyaches.  She denies shortness of breath or fever.  Right now her pain is rated as a 7 out of 10.  No vaginal or pelvic pain.  Home Medications Prior to Admission medications   Medication Sig Start Date End Date Taking? Authorizing Provider  amLODipine (NORVASC) 10 MG tablet Take 1 tablet (10 mg total) by mouth daily. Patient not taking: Reported on 08/28/2022 02/02/20   Ripley Fraise, MD  metroNIDAZOLE (FLAGYL) 500 MG tablet Take 1 tablet (500 mg total) by mouth 2 (two) times daily. One po bid x 7 days Patient not taking: Reported on 08/28/2022 04/30/22   Mesner, Corene Cornea, MD  famotidine (PEPCID) 20 MG tablet Take 1 tablet (20 mg total) by mouth 2 (two) times daily. Patient not taking: Reported on 01/12/2018 12/18/17 02/02/20  Rolland Porter, MD  hydrochlorothiazide (HYDRODIURIL) 25 MG tablet Take 1 tablet (25 mg total) by mouth daily. 06/17/19 02/02/20  Rolland Porter, MD      Allergies    Lisinopril-hydrochlorothiazide, Ibuprofen, Tramadol, and Erythromycin    Review of Systems   Review of Systems  Constitutional:  Negative for fever.  Respiratory:  Positive for cough. Negative for shortness of breath.   Gastrointestinal:  Positive for abdominal pain, diarrhea and nausea. Negative for blood in stool and vomiting.  Genitourinary:   Positive for hematuria and vaginal bleeding. Negative for dysuria.  Neurological:  Positive for headaches.    Physical Exam Updated Vital Signs BP 109/69   Pulse (!) 118   Temp 98.9 F (37.2 C) (Oral)   Resp 15   Wt 76.7 kg   LMP 07/16/2022 (Approximate)   SpO2 100%   BMI 28.12 kg/m  Physical Exam Vitals and nursing note reviewed.  Constitutional:      General: She is not in acute distress.    Appearance: She is well-developed. She is not ill-appearing or diaphoretic.  HENT:     Head: Normocephalic and atraumatic.  Cardiovascular:     Rate and Rhythm: Regular rhythm. Tachycardia present.     Heart sounds: Normal heart sounds.  Pulmonary:     Effort: Pulmonary effort is normal.     Breath sounds: Normal breath sounds.  Abdominal:     Palpations: Abdomen is soft.     Tenderness: There is abdominal tenderness (diffuse, primarily lower).  Skin:    General: Skin is warm and dry.  Neurological:     Mental Status: She is alert.     ED Results / Procedures / Treatments   Labs (all labs ordered are listed, but only abnormal results are displayed) Labs Reviewed  RESP PANEL BY RT-PCR (RSV, FLU A&B, COVID)  RVPGX2 - Abnormal; Notable for the following components:      Result Value   SARS Coronavirus 2 by RT PCR POSITIVE (*)  All other components within normal limits  COMPREHENSIVE METABOLIC PANEL - Abnormal; Notable for the following components:   Sodium 131 (*)    Potassium 3.2 (*)    Chloride 95 (*)    Glucose, Bld 114 (*)    BUN 22 (*)    Creatinine, Ser 2.61 (*)    Total Protein 8.9 (*)    GFR, Estimated 22 (*)    All other components within normal limits  CBC WITH DIFFERENTIAL/PLATELET - Abnormal; Notable for the following components:   WBC 22.3 (*)    MCH 25.9 (*)    RDW 16.5 (*)    Neutro Abs 18.7 (*)    Monocytes Absolute 1.8 (*)    Abs Immature Granulocytes 0.19 (*)    All other components within normal limits  CBG MONITORING, ED - Abnormal; Notable for  the following components:   Glucose-Capillary 129 (*)    All other components within normal limits  URINE CULTURE  CK  LIPASE, BLOOD  URINALYSIS, ROUTINE W REFLEX MICROSCOPIC  POC URINE PREG, ED    EKG EKG Interpretation  Date/Time:  Thursday August 28 2022 10:41:52 EST Ventricular Rate:  133 PR Interval:  169 QRS Duration: 76 QT Interval:  273 QTC Calculation: 406 R Axis:   34 Text Interpretation: Sinus tachycardia LAE, consider biatrial enlargement Borderline T wave abnormalities rate is faster, otherwise T waves unchanged since Dec 2022 Confirmed by Sherwood Gambler [phone]) on 08/28/2022 11:19:51 AM  Radiology CT ABDOMEN PELVIS WO CONTRAST  Result Date: 08/28/2022 CLINICAL DATA:  Abdominal pain. Diarrhea. COVID positive. Weakness. Nausea. Smoker with COPD. * Tracking Code: BO * EXAM: CT ABDOMEN AND PELVIS WITHOUT CONTRAST TECHNIQUE: Multidetector CT imaging of the abdomen and pelvis was performed following the standard protocol without IV contrast. RADIATION DOSE REDUCTION: This exam was performed according to the departmental dose-optimization program which includes automated exposure control, adjustment of the mA and/or kV according to patient size and/or use of iterative reconstruction technique. COMPARISON:  Chest CT 01/12/2018. Abdominopelvic CT from [facility] [place] 04/03/2019, report only FINDINGS: Lower chest: Centrilobular emphysema. Bilateral subpleural predominant pulmonary nodules. A lingular 8 mm nodule on 01/04 is new since 01/12/2018. A right middle lobe 5 mm nodule on 08/04 is new since 01/12/2018 as is a 1.2 cm right middle lobe nodule on 14/4. Mild cardiomegaly, without pericardial or pleural effusion. Hepatobiliary: Moderate hepatic steatosis. Caudate and lateral segment left liver lobe enlargement. More focal hypoattenuation adjacent to falciform ligament is likely due to focal steatosis. Normal gallbladder, without biliary ductal dilatation. Pancreas: Normal, without  mass or ductal dilatation. Spleen: Normal in size, without focal abnormality. Adrenals/Urinary Tract: Normal adrenal glands. No renal calculi or hydronephrosis. No hydroureter or ureteric calculi. No bladder calculi. The bladder is thick walled with mild pericystic edema on 75/2. Stomach/Bowel: Normal stomach, without wall thickening. Fluid-filled colon suggests a diarrheal state. Normal terminal ileum. The appendix is mildly dilated including up to 1.0 cm on 49/5. Normal small bowel. Vascular/Lymphatic: Aortic atherosclerosis. Abdominal retroperitoneal adenopathy. 1.5 cm left para-aortic node on 30/2. Aortocaval nodes of up to 1.1 cm on 42/2. Left external iliac 1.0 cm node on 56/2. Right common iliac 1.3 cm node on 47/2. Reproductive: Equivocal soft tissue fullness within the uterine cervix on 71/2. No dominant adnexal mass, given limitation of noncontrast technique. Other: No significant free fluid.  No free intraperitoneal air. 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 Musculoskeletal: Remote lower left rib fractures.  IMPRESSION: 1. Constellation of findings which is most consistent with metastatic disease, including abdominopelvic adenopathy, peritoneal carcinomatosis, and new bibasilar pulmonary nodules. 2. No definite primary source identified given limitations of noncontrast stone study technique. There is equivocal soft tissue fullness within the uterine cervix and the appendix is mildly dilated without significant surrounding inflammation. These are both potential sites of primary which could be further evaluated via multidisciplinary oncology consultation for eventual contrast-enhanced chest abdomen and pelvic CT and/or PET. 3. Hepatic steatosis with probable cirrhosis. 4. Bladder wall thickening and pericystic edema, suggesting cystitis. 5. Fluid-filled colon suggests a diarrheal state. 6.  Aortic Atherosclerosis (ICD10-I70.0).   Emphysema (ICD10-J43.9). Electronically Signed   By: Abigail Miyamoto M.D.   On: 08/28/2022 13:51   DG Chest Portable 1 View  Result Date: 08/28/2022 CLINICAL DATA:  Cough EXAM: PORTABLE CHEST 1 VIEW COMPARISON:  08/19/2021 FINDINGS: Heart size and vascularity normal. Negative for heart failure. Negative for infiltrate or effusion Possible 1 cm nodule right lung base overlying the diaphragm, not seen on prior studies. IMPRESSION: Possible 1 cm nodule right lung base. Follow-up chest x-ray versus chest CT suggested. Electronically Signed   By: Franchot Gallo M.D.   On: 08/28/2022 11:36    Procedures Procedures    Medications Ordered in ED Medications  cefTRIAXone (ROCEPHIN) 1 g in sodium chloride 0.9 % 100 mL IVPB (has no administration in time range)  HYDROmorphone (DILAUDID) injection 1 mg (1 mg Intravenous Given 08/28/22 1210)  sodium chloride 0.9 % bolus 1,000 mL (0 mLs Intravenous Stopped 08/28/22 1304)  ondansetron (ZOFRAN) injection 4 mg (4 mg Intravenous Given 08/28/22 1218)  lactated ringers bolus 1,000 mL (1,000 mLs Intravenous New Bag/Given 08/28/22 1304)  potassium chloride SA (KLOR-CON M) CR tablet 40 mEq (40 mEq Oral Given 08/28/22 1231)    ED Course/ Medical Decision Making/ A&P                           Medical Decision Making Amount and/or Complexity of Data Reviewed Labs: ordered.    Details: Acute kidney injury.  WBC 22,000. Radiology: ordered and independent interpretation performed.    Details: No small bowel obstruction. ECG/medicine tests: ordered and independent interpretation performed.    Details: Sinus tachycardia.  Risk Prescription drug management. Decision regarding hospitalization.   Patient is quite tachycardic which I think is partially from pain and partially from dehydration.  She had 1 episode of hypotension but otherwise has been normotensive.  Has been given 2 L of IV fluids and IV Dilaudid for pain.  Unfortunately her lab work shows mild  hypokalemia which goes along with diarrhea but also an acute kidney injury.  She has a significant leukocytosis and based on the CT results this is probably from her urine and so I will give her a dose of Rocephin though we will need a different urine sample because the first 1 was only enough to do a pregnancy test.  Otherwise, her COVID is positive which I suspect is what is causing the vomiting and diarrhea.  Unfortunately her CT also is concerning for metastatic disease.  No clear primary.  I have discussed these results with her but she will need further workup.  She will need admission for the AKI.  Discussed with Dr. Louanne Belton.        Final Clinical Impression(s) / ED Diagnoses Final diagnoses:  Acute kidney injury ([place])  COVID-19    Rx / DC Orders ED Discharge Orders  None         Sherwood Gambler, MD 08/28/22 1504

## 2022-08-28 NOTE — ED Notes (Signed)
Given snack per request. Drinking POs. IVF bolus complete. Pending repeat urine sample. Rocephin infusing.

## 2022-08-28 NOTE — ED Notes (Signed)
EDP into room, at BS.  ?

## 2022-08-29 DIAGNOSIS — R1900 Intra-abdominal and pelvic swelling, mass and lump, unspecified site: Secondary | ICD-10-CM | POA: Diagnosis not present

## 2022-08-29 DIAGNOSIS — N179 Acute kidney failure, unspecified: Secondary | ICD-10-CM | POA: Diagnosis not present

## 2022-08-29 DIAGNOSIS — U071 COVID-19: Secondary | ICD-10-CM | POA: Diagnosis not present

## 2022-08-29 DIAGNOSIS — R197 Diarrhea, unspecified: Secondary | ICD-10-CM | POA: Diagnosis not present

## 2022-08-29 LAB — CBC
HCT: 33.5 % — ABNORMAL LOW (ref 36.0–46.0)
HCT: 29.5 % — ABNORMAL LOW (ref 36.0–46.0)
Hemoglobin: 10.7 g/dL — ABNORMAL LOW (ref 12.0–15.0)
Hemoglobin: 9.4 g/dL — ABNORMAL LOW (ref 12.0–15.0)
MCH: 25.7 pg — ABNORMAL LOW (ref 26.0–34.0)
MCH: 25.8 pg — ABNORMAL LOW (ref 26.0–34.0)
MCHC: 31.9 g/dL (ref 30.0–36.0)
MCHC: 31.9 g/dL (ref 30.0–36.0)
MCV: 80.5 fL (ref 80.0–100.0)
MCV: 81 fL (ref 80.0–100.0)
Platelets: 244 10*3/uL (ref 150–400)
Platelets: 219 10*3/uL (ref 150–400)
RBC: 4.16 MIL/uL (ref 3.87–5.11)
RBC: 3.64 MIL/uL — ABNORMAL LOW (ref 3.87–5.11)
RDW: 15.9 % — ABNORMAL HIGH (ref 11.5–15.5)
RDW: 15.7 % — ABNORMAL HIGH (ref 11.5–15.5)
WBC: 16 10*3/uL — ABNORMAL HIGH (ref 4.0–10.5)
WBC: 17 10*3/uL — ABNORMAL HIGH (ref 4.0–10.5)
nRBC: 0 % (ref 0.0–0.2)
nRBC: 0 % (ref 0.0–0.2)

## 2022-08-29 LAB — COMPREHENSIVE METABOLIC PANEL
ALT: 15 U/L (ref 0–44)
AST: 23 U/L (ref 15–41)
Albumin: 2.9 g/dL — ABNORMAL LOW (ref 3.5–5.0)
Alkaline Phosphatase: 56 U/L (ref 38–126)
Anion gap: 11 (ref 5–15)
BUN: 22 mg/dL — ABNORMAL HIGH (ref 6–20)
CO2: 19 mmol/L — ABNORMAL LOW (ref 22–32)
Calcium: 8.2 mg/dL — ABNORMAL LOW (ref 8.9–10.3)
Chloride: 104 mmol/L (ref 98–111)
Creatinine, Ser: 1.6 mg/dL — ABNORMAL HIGH (ref 0.44–1.00)
GFR, Estimated: 39 mL/min — ABNORMAL LOW (ref >60)
Glucose, Bld: 85 mg/dL (ref 70–99)
Potassium: 3.7 mmol/L (ref 3.5–5.1)
Sodium: 134 mmol/L — ABNORMAL LOW (ref 135–145)
Total Bilirubin: 0.6 mg/dL (ref 0.3–1.2)
Total Protein: 7.3 g/dL (ref 6.5–8.1)

## 2022-08-29 LAB — PROTIME-INR
INR: 1 (ref 0.8–1.2)
Prothrombin Time: 13.5 seconds (ref 11.4–15.2)

## 2022-08-29 LAB — TSH: TSH: 3.375 u[IU]/mL (ref 0.350–4.500)

## 2022-08-29 MED ORDER — GUAIFENESIN-DM 100-10 MG/5ML PO SYRP
5.0000 mL | ORAL_SOLUTION | ORAL | Status: DC | PRN
  Administered 2022-08-29 – 2022-08-31 (×6): 5 mL via ORAL
  Filled 2022-08-29 (×6): qty 5

## 2022-08-29 MED ORDER — SODIUM CHLORIDE 0.9 % IV SOLN: INTRAVENOUS | Status: DC

## 2022-08-29 MED ORDER — LORAZEPAM 1 MG PO TABS
1.0000 mg | ORAL_TABLET | ORAL | Status: DC | PRN
  Administered 2022-08-29 – 2022-08-31 (×7): 1 mg via ORAL
  Filled 2022-08-29 (×7): qty 1

## 2022-08-29 NOTE — Progress Notes (Signed)
Pt has complained of pain during the night, PRN dilaudid given. Pt's heart rate has been elevated through the night and has presented as a yellow MEWS. Charge nurse notified.

## 2022-08-29 NOTE — Plan of Care (Signed)
  Problem: Education: Goal: Knowledge of General Education information will improve Description: Including pain rating scale, medication(s)/side effects and non-pharmacologic comfort measures Outcome: Progressing   Problem: Activity: Goal: Risk for activity intolerance will decrease Outcome: Progressing   Problem: Nutrition: Goal: Adequate nutrition will be maintained Outcome: Progressing   Problem: Pain Managment: Goal: General experience of comfort will improve Outcome: Progressing   Problem: Education: Goal: Knowledge of risk factors and measures for prevention of condition will improve Outcome: Progressing   Problem: Respiratory: Goal: Complications related to the disease process, condition or treatment will be avoided or minimized Outcome: Progressing  Patient ambulates independently in room, denies feeling up to ambulating in hallway due to abdominal discomfort and feeling tired. Reports dilaudid has reduced pain, requested cough medication for nonproductive/dry cough. Tolerated breakfast with no complaints.

## 2022-08-29 NOTE — Discharge Instructions (Signed)
  Providers Accepting New Patients in [place], [place]. [address] [place], [place] [address] [phone] Accepts most insurances  [facility] [address] [place], [place] [address] [phone] Accepts most insurances  [facility] [address] [place], [place] [address]  [phone] Must meet requirements  [place] [address] [place], [place] [address] [phone] Accepts most insurances  [facility] [address]  [place], [place] [address] [phone] Accepts most insurances  [facility] [address]   [place], [place]   [phone] Accepts most insurances  [facility] ([place])  [phone] [address]  [place], [place] [address] [phone] Accepts most insurances     [facility] [address]  [place], [place] [address] [phone] Accepts most [place], [place] [address] [phone] option 1 Accepts Medicaid and [facility] [address]  [place], [place] [address] [phone] Accepts most insurances  Rosita Fire, MD [place], [place] [address] [phone] Accepts most insurances  [facility] [place]. [place], [place] [address] [phone] Accepts most insurances  [facility] [phone] [address] [place], [place] [address] [phone] Accepts most insurances  [place], [place] [address], [address] [place],  [address] [phone]  Accepts most insurances

## 2022-08-29 NOTE — Progress Notes (Signed)
   08/28/22 2010  Assess: MEWS Score  Temp 98.3 F (36.8 C)  BP 130/78  MAP (mmHg) 88  Pulse Rate (!) 112  Resp 16  SpO2 100 %  O2 Device Room Air  Assess: MEWS Score  MEWS Temp 0  MEWS Systolic 0  MEWS Pulse 2  MEWS RR 0  MEWS LOC 0  MEWS Score 2  MEWS Score Color Yellow  Assess: if the MEWS score is Yellow or Red  Were vital signs taken at a resting state? Yes  Focused Assessment Change from prior assessment (see assessment flowsheet)  Does the patient meet 2 or more of the SIRS criteria? No  MEWS guidelines implemented *See Row Information* Yes  Treat  MEWS Interventions Escalated (See documentation below)  Pain Scale 0-10  Pain Score 0  Take Vital Signs  Increase Vital Sign Frequency  Yellow: Q 2hr X 2 then Q 4hr X 2, if remains yellow, continue Q 4hrs  Escalate  MEWS: Escalate Yellow: discuss with charge nurse/RN and consider discussing with provider and RRT  Notify: Charge Nurse/RN  Name of Charge Nurse/RN Notified [place], RN  Date Charge Nurse/RN Notified 08/28/22  Time Charge Nurse/RN Notified 2022  Assess: SIRS CRITERIA  SIRS Temperature  0  SIRS Pulse 1  SIRS Respirations  0  SIRS WBC 0  SIRS Score Sum  1   Pt yellow MEWS, charge nurse [facility] notified.

## 2022-08-29 NOTE — Progress Notes (Signed)
   08/29/22 2116  Assess: MEWS Score  Temp 98.7 F (37.1 C)  BP (!) 135/91  MAP (mmHg) 104  Pulse Rate (!) 111  Resp 19  SpO2 100 %  O2 Device Room Air  Assess: MEWS Score  MEWS Temp 0  MEWS Systolic 0  MEWS Pulse 2  MEWS RR 0  MEWS LOC 0  MEWS Score 2  MEWS Score Color Yellow  Assess: if the MEWS score is Yellow or Red  Were vital signs taken at a resting state? Yes  Focused Assessment No change from prior assessment  Does the patient meet 2 or more of the SIRS criteria? No  MEWS guidelines implemented *See Row Information* No, previously yellow, continue vital signs every 4 hours  Treat  MEWS Interventions Escalated (See documentation below)  Pain Scale 0-10  Pain Score 6  Pain Type Acute pain  Pain Location Abdomen  Pain Orientation Lower  Pain Descriptors / Indicators Aching  Pain Frequency Intermittent  Pain Onset On-going  Pain Intervention(s) Medication (See eMAR)  Complains of Anxiety  Interventions Medication (see MAR)  Patients response to intervention Relief  Take Vital Signs  Increase Vital Sign Frequency  Yellow: Q 2hr X 2 then Q 4hr X 2, if remains yellow, continue Q 4hrs  Escalate  MEWS: Escalate Yellow: discuss with charge nurse/RN and consider discussing with provider and RRT  Notify: Charge Nurse/RN  Name of Charge Nurse/RN Notified Letina, RN  Date Charge Nurse/RN Notified 08/29/22  Time Charge Nurse/RN Notified 2135  Assess: SIRS CRITERIA  SIRS Temperature  0  SIRS Pulse 1  SIRS Respirations  0  SIRS WBC 0  SIRS Score Sum  1   Pt has continued to be a yellow MEWS.

## 2022-08-29 NOTE — Progress Notes (Signed)
PROGRESS NOTE    Kristin Carson  QAS:[id number] DOB: 10/27/1970 DOA: 08/28/2022 PCP: Pcp, No    Brief Narrative:   Patient is a 51 years old female with past medical history of COPD, active smoker, depression, GERD, hypertension presented to the hospital with diarrhea and nausea for the last 3 to 4 days with intermittent postcoital bleeding abdominal discomfort.  Has not been to a doctor for a long time.  In the ED patient was initially mildly hypotensive and tachycardic.  She had leukocytosis. Patient tested positive for COVID.  CT scan of the abdomen pelvis done in the ED showed multiple findings including abdominal pelvic adenopathy, possible peritoneal carcinomatosis, new bibasilar pulmonary nodules without any obvious source but the scan was without contrast.  Patient was then considered for admission to hospital for acute kidney injury, volume depletion with COVID-19 infection and abnormal CT scan.   Assessment and Plan:  Diarrhea and nausea abdominal discomfort could be secondary to COVID-19 infection.  Continue with IV hydration, symptomatic care with antiemetics.  Pending stool for GI pathogen panel, C. difficile since she had not had a bowel movement today.,   Significant leukocytosis could be reactive/secondary to UTI.  Urine analysis with RBC WBC nitrite was negative.  On empiric Rocephin.   CT scan shows bladder wall thickening, urine culture pending.   COVID-19 infection.  Provide supportive care.   No respiratory symptoms except cough no hypoxia.   Acute kidney injury.  Creatinine today at 1.6 from initial presentation at 2.5.  Baseline around 1.0.  Likely secondary to diarrhea and nausea poor oral intake.  Will continue with IV fluid hydration.    Intermittent abdominal pain, postcoital bleeding.  Patient has not had any vaginal exam done in the past.  Possibility of uterine cancer with metastasis.  Unclear at this time due to lack of contrast.  Will repeat her scan with contrast  when renal failure improved.   Active cigarette smoking.  Will put the patient on nicotine patch.  Counseled to quit.   History of GERD.  Continue PPI.   Essential hypertension. Continue amlodipine.  Blood pressure has improved at this time.     DVT prophylaxis: heparin injection 5,000 Units Start: 08/28/22 1600   Code Status:     Code Status: Full Code  Disposition: Likely home in 1 to 2 days..  Patient does not have a primary care physician and insurance.  Will get the [facility] consultation for assistance/PCP needs.  Status is: Inpatient Remains inpatient appropriate because: Acute kidney injury, abnormal CT scan, volume depletion, IV fluids, COVID-19 infection.   Family Communication: None at bedside  Consultants:  None so far  Procedures:  None  Antimicrobials:  Rocephin IV  Anti-infectives (From admission, onward)    Start     Dose/Rate Route Frequency Ordered Stop   08/29/22 1600  cefTRIAXone (ROCEPHIN) 1 g in sodium chloride 0.9 % 100 mL IVPB        1 g 200 mL/hr over 30 Minutes Intravenous Every 24 hours 08/28/22 1548 08/31/22 1559   08/28/22 1400  cefTRIAXone (ROCEPHIN) 1 g in sodium chloride 0.9 % 100 mL IVPB        1 g 200 mL/hr over 30 Minutes Intravenous  Once 08/28/22 1356 08/28/22 1622        Subjective: Today, patient was seen and examined at bedside.  Patient feels a little better today but still has some cough but denies any pain, nausea, vomiting.  Diarrhea has improved and has  not had a bowel movement today.  Objective: Vitals:   08/28/22 2208 08/29/22 0002 08/29/22 0246 08/29/22 0300  BP: 97/75 117/73 (!) 139/94   Pulse: (!) 106 (!) 106    Resp: '18 18 18   '$ Temp: 99.1 F (37.3 C) 98.4 F (36.9 C) 98.2 F (36.8 C)   TempSrc: Oral Oral Oral   SpO2: 100% 100%    Weight:    87.7 kg  Height:    '5\' 4"'$  (1.626 m)    Intake/Output Summary (Last 24 hours) at 08/29/2022 0755 Last data filed at 08/29/2022 0600 Gross per 24 hour  Intake 3420.45 ml   Output --  Net 3420.45 ml   Filed Weights   08/28/22 1055 08/28/22 1836 08/29/22 0300  Weight: 76.7 kg 85 kg 87.7 kg    Physical Examination:  Body mass index is 33.19 kg/m.  General: Obese built, not in obvious distress HENT:   No scleral pallor or icterus noted. Oral mucosa is dry. Chest:  Clear breath sounds.  Diminished breath sounds bilaterally. No crackles or wheezes.  CVS: S1 &S2 heard. No murmur.  Regular rate and rhythm. Abdomen: Soft, nontender, nondistended.  Obese abdomen.  Bowel sounds are heard.   Extremities: No cyanosis, clubbing or edema.  Peripheral pulses are palpable. Psych: Alert, awake and oriented, normal mood CNS:  No cranial nerve deficits.  Power equal in all extremities.   Skin: Warm and dry.  No rashes noted.  Data Reviewed:   CBC: Recent Labs  Lab 08/28/22 1111 08/29/22 0407  WBC 22.3* 16.0*  NEUTROABS 18.7*  --   HGB 12.3 10.7*  HCT 37.9 33.5*  MCV 80.0 80.5  PLT 308 962    Basic Metabolic Panel: Recent Labs  Lab 08/28/22 1111 08/29/22 0407  NA 131* 134*  K 3.2* 3.7  CL 95* 104  CO2 22 19*  GLUCOSE 114* 85  BUN 22* 22*  CREATININE 2.61* 1.60*  CALCIUM 8.9 8.2*    Liver Function Tests: Recent Labs  Lab 08/28/22 1111 08/29/22 0407  AST 32 23  ALT 21 15  ALKPHOS 69 56  BILITOT 0.6 0.6  PROT 8.9* 7.3  ALBUMIN 3.5 2.9*     Radiology Studies: CT ABDOMEN PELVIS WO CONTRAST  Result Date: 08/28/2022 CLINICAL DATA:  Abdominal pain. Diarrhea. COVID positive. Weakness. Nausea. Smoker with COPD. * Tracking Code: BO * EXAM: CT ABDOMEN AND PELVIS WITHOUT CONTRAST TECHNIQUE: Multidetector CT imaging of the abdomen and pelvis was performed following the standard protocol without IV contrast. RADIATION DOSE REDUCTION: This exam was performed according to the departmental dose-optimization program which includes automated exposure control, adjustment of the mA and/or kV according to patient size and/or use of iterative reconstruction  technique. COMPARISON:  Chest CT 01/12/2018. Abdominopelvic CT from [facility] rocking ham 04/03/2019, report only FINDINGS: Lower chest: Centrilobular emphysema. Bilateral subpleural predominant pulmonary nodules. A lingular 8 mm nodule on 01/04 is new since 01/12/2018. A right middle lobe 5 mm nodule on 08/04 is new since 01/12/2018 as is a 1.2 cm right middle lobe nodule on 14/4. Mild cardiomegaly, without pericardial or pleural effusion. Hepatobiliary: Moderate hepatic steatosis. Caudate and lateral segment left liver lobe enlargement. More focal hypoattenuation adjacent to falciform ligament is likely due to focal steatosis. Normal gallbladder, without biliary ductal dilatation. Pancreas: Normal, without mass or ductal dilatation. Spleen: Normal in size, without focal abnormality. Adrenals/Urinary Tract: Normal adrenal glands. No renal calculi or hydronephrosis. No hydroureter or ureteric calculi. No bladder calculi. The bladder is thick walled  with mild pericystic edema on 75/2. Stomach/Bowel: Normal stomach, without wall thickening. Fluid-filled colon suggests a diarrheal state. Normal terminal ileum. The appendix is mildly dilated including up to 1.0 cm on 49/5. Normal small bowel. Vascular/Lymphatic: Aortic atherosclerosis. Abdominal retroperitoneal adenopathy. 1.5 cm left para-aortic node on 30/2. Aortocaval nodes of up to 1.1 cm on 42/2. Left external iliac 1.0 cm node on 56/2. Right common iliac 1.3 cm node on 47/2. Reproductive: Equivocal soft tissue fullness within the uterine cervix on 71/2. No dominant adnexal mass, given limitation of noncontrast technique. Other: No significant free fluid.  No free intraperitoneal air. 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 Musculoskeletal: Remote lower left rib fractures. IMPRESSION: 1. Constellation of findings which is most consistent with metastatic disease, including abdominopelvic  adenopathy, peritoneal carcinomatosis, and new bibasilar pulmonary nodules. 2. No definite primary source identified given limitations of noncontrast stone study technique. There is equivocal soft tissue fullness within the uterine cervix and the appendix is mildly dilated without significant surrounding inflammation. These are both potential sites of primary which could be further evaluated via multidisciplinary oncology consultation for eventual contrast-enhanced chest abdomen and pelvic CT and/or PET. 3. Hepatic steatosis with probable cirrhosis. 4. Bladder wall thickening and pericystic edema, suggesting cystitis. 5. Fluid-filled colon suggests a diarrheal state. 6.  Aortic Atherosclerosis (ICD10-I70.0).  Emphysema (ICD10-J43.9). Electronically Signed   By: Abigail Miyamoto M.D.   On: 08/28/2022 13:51   DG Chest Portable 1 View  Result Date: 08/28/2022 CLINICAL DATA:  Cough EXAM: PORTABLE CHEST 1 VIEW COMPARISON:  08/19/2021 FINDINGS: Heart size and vascularity normal. Negative for heart failure. Negative for infiltrate or effusion Possible 1 cm nodule right lung base overlying the diaphragm, not seen on prior studies. IMPRESSION: Possible 1 cm nodule right lung base. Follow-up chest x-ray versus chest CT suggested. Electronically Signed   By: Franchot Gallo M.D.   On: 08/28/2022 11:36      LOS: 1 day   Flora Lipps, MD [facility] Available via Epic secure chat 7am-7pm After these hours, please refer to coverage provider listed on [email or web address] 08/29/2022, 7:55 AM

## 2022-08-29 NOTE — Plan of Care (Signed)
  Problem: Coping: Goal: Psychosocial and spiritual needs will be supported Outcome: Progressing   Problem: Respiratory: Goal: Will maintain a patent airway Outcome: Progressing   

## 2022-08-29 NOTE — TOC Initial Note (Addendum)
Transition of Care [facility]) - Initial/Assessment Note    Patient Details  Name: Kristin Carson MRN: [id number] Date of Birth: August 01, 1971  Transition of Care [facility]) CM/SW Contact:    Boneta Lucks, RN Phone Number: 08/29/2022, 2:19 PM  Clinical Narrative:      Patient admitted with Acute kidney injury. TOC consulted for Substance abuse. Patient states she needs it quit alcohol and does not need resources. Education added to AVS for her review. Patient only has medicaid planning and no PCP. Patient is agreeable to Care connect program. Referral sent and added to AVS. They will reopen 09/02/22         Expected Discharge Plan: Home/Self Care Barriers to Discharge: Continued Medical Work up   Patient Goals and CMS Choice Patient states their goals for this hospitalization and ongoing recovery are:: agreeable to care connect CMS Medicare.gov Compare Post Acute Care list provided to:: Patient    Expected Discharge Plan and Services      Living arrangements for the past 2 months: Single Family Home           Prior Living Arrangements/Services Living arrangements for the past 2 months: [place] Lives with:: Adult Children          Activities of Daily Living Home Assistive Devices/Equipment: None ADL Screening (condition at time of admission) Patient's cognitive ability adequate to safely complete daily activities?: Yes Is the patient deaf or have difficulty hearing?: No Does the patient have difficulty seeing, even when wearing glasses/contacts?: No Does the patient have difficulty concentrating, remembering, or making decisions?: No Patient able to express need for assistance with ADLs?: Yes Does the patient have difficulty dressing or bathing?: No Independently performs ADLs?: Yes (appropriate for developmental age) Does the patient have difficulty walking or climbing stairs?: No Weakness of Legs: None Weakness of Arms/Hands: None  Permission Sought/Granted   Emotional  Assessment      Orientation: : Oriented to Self, Oriented to Place, Oriented to  Time, Oriented to Situation Alcohol / Substance Use: Alcohol Use Psych Involvement: No (comment)  Admission diagnosis:  AKI (acute kidney injury) ([place]) [N17.9] Acute kidney injury ([place]) [N17.9] COVID-19 [U07.1] Patient Active Problem List   Diagnosis Date Noted   AKI (acute kidney injury) ([place]) 08/28/2022   Volume depletion 08/28/2022   Diarrhea 08/28/2022   COVID-19 virus infection 08/28/2022   Abdominal mass 08/28/2022   Essential hypertension 12/10/2016   Chronic obstructive pulmonary disease ([place]) 12/10/2016   Cigarette nicotine dependence with nicotine-induced disorder 12/10/2016   Chronic kidney disease 12/10/2016   SOB (shortness of breath) 04/07/2013   Migraine 04/07/2013   PCP:  Merryl Hacker, No Pharmacy:   [facility], [place] - [address] [place]  [address] Phone: [phone] Fax: [phone]     Social Determinants of Health (SDOH) Social History: SDOH Screenings   Tobacco Use: High Risk (08/28/2022)   SDOH Interventions:  Readmission Risk Interventions    08/29/2022    2:18 PM  Readmission Risk Prevention Plan  Transportation Screening Complete  PCP or Specialist Appt within 5-7 Days Complete  Home Care Screening Complete  Medication Review (RN CM) Complete

## 2022-08-30 ENCOUNTER — Inpatient Hospital Stay (HOSPITAL_COMMUNITY): Payer: Medicaid Other

## 2022-08-30 DIAGNOSIS — J449 Chronic obstructive pulmonary disease, unspecified: Secondary | ICD-10-CM | POA: Diagnosis not present

## 2022-08-30 DIAGNOSIS — N189 Chronic kidney disease, unspecified: Secondary | ICD-10-CM | POA: Diagnosis not present

## 2022-08-30 DIAGNOSIS — N179 Acute kidney failure, unspecified: Secondary | ICD-10-CM | POA: Diagnosis not present

## 2022-08-30 DIAGNOSIS — E876 Hypokalemia: Secondary | ICD-10-CM | POA: Insufficient documentation

## 2022-08-30 DIAGNOSIS — R1900 Intra-abdominal and pelvic swelling, mass and lump, unspecified site: Secondary | ICD-10-CM | POA: Diagnosis not present

## 2022-08-30 LAB — BASIC METABOLIC PANEL
Anion gap: 7 (ref 5–15)
BUN: 9 mg/dL (ref 6–20)
CO2: 22 mmol/L (ref 22–32)
Calcium: 7.9 mg/dL — ABNORMAL LOW (ref 8.9–10.3)
Chloride: 106 mmol/L (ref 98–111)
Creatinine, Ser: 0.89 mg/dL (ref 0.44–1.00)
GFR, Estimated: >60 mL/min (ref >60)
Glucose, Bld: 102 mg/dL — ABNORMAL HIGH (ref 70–99)
Potassium: 3.3 mmol/L — ABNORMAL LOW (ref 3.5–5.1)
Sodium: 135 mmol/L (ref 135–145)

## 2022-08-30 LAB — HEMOGLOBIN A1C
Hgb A1c MFr Bld: 5.3 % (ref 4.8–5.6)
Mean Plasma Glucose: 105 mg/dL

## 2022-08-30 LAB — CBC
HCT: 28.3 % — ABNORMAL LOW (ref 36.0–46.0)
HCT: 26.9 % — ABNORMAL LOW (ref 36.0–46.0)
Hemoglobin: 9.3 g/dL — ABNORMAL LOW (ref 12.0–15.0)
Hemoglobin: 8.5 g/dL — ABNORMAL LOW (ref 12.0–15.0)
MCH: 26.1 pg (ref 26.0–34.0)
MCH: 25.4 pg — ABNORMAL LOW (ref 26.0–34.0)
MCHC: 32.9 g/dL (ref 30.0–36.0)
MCHC: 31.6 g/dL (ref 30.0–36.0)
MCV: 79.3 fL — ABNORMAL LOW (ref 80.0–100.0)
MCV: 80.3 fL (ref 80.0–100.0)
Platelets: 243 10*3/uL (ref 150–400)
Platelets: 244 10*3/uL (ref 150–400)
RBC: 3.57 MIL/uL — ABNORMAL LOW (ref 3.87–5.11)
RBC: 3.35 MIL/uL — ABNORMAL LOW (ref 3.87–5.11)
RDW: 15.8 % — ABNORMAL HIGH (ref 11.5–15.5)
RDW: 15.9 % — ABNORMAL HIGH (ref 11.5–15.5)
WBC: 11.8 10*3/uL — ABNORMAL HIGH (ref 4.0–10.5)
WBC: 12.4 10*3/uL — ABNORMAL HIGH (ref 4.0–10.5)
nRBC: 0 % (ref 0.0–0.2)
nRBC: 0 % (ref 0.0–0.2)

## 2022-08-30 LAB — URINE CULTURE: Culture: 10000 — AB

## 2022-08-30 LAB — MAGNESIUM: Magnesium: 1.7 mg/dL (ref 1.7–2.4)

## 2022-08-30 LAB — C DIFFICILE QUICK SCREEN W PCR REFLEX
C Diff antigen: NEGATIVE
C Diff interpretation: NOT DETECTED
C Diff toxin: NEGATIVE

## 2022-08-30 MED ORDER — SODIUM CHLORIDE 0.9 % IV SOLN: INTRAVENOUS | Status: AC

## 2022-08-30 MED ORDER — IOHEXOL 300 MG/ML  SOLN
100.0000 mL | Freq: Once | INTRAMUSCULAR | Status: AC | PRN
  Administered 2022-08-30: 100 mL via INTRAVENOUS

## 2022-08-30 MED ORDER — IOHEXOL 9 MG/ML PO SOLN
500.0000 mL | ORAL | Status: AC
  Administered 2022-08-30 (×2): 500 mL via ORAL

## 2022-08-30 MED ORDER — IOHEXOL 9 MG/ML PO SOLN
ORAL | Status: AC
  Filled 2022-08-30: qty 1000

## 2022-08-30 MED ORDER — POTASSIUM CHLORIDE CRYS ER 20 MEQ PO TBCR
40.0000 meq | EXTENDED_RELEASE_TABLET | Freq: Once | ORAL | Status: AC
  Administered 2022-08-30: 40 meq via ORAL
  Filled 2022-08-30: qty 2

## 2022-08-30 NOTE — Progress Notes (Signed)
   08/30/22 1007  Assess: MEWS Score  Temp 98.2 F (36.8 C)  BP 136/63  MAP (mmHg) 82  Pulse Rate (!) 114  SpO2 100 %  Assess: MEWS Score  MEWS Temp 0  MEWS Systolic 0  MEWS Pulse 2  MEWS RR 0  MEWS LOC 0  MEWS Score 2  MEWS Score Color Yellow  Assess: if the MEWS score is Yellow or Red  Were vital signs taken at a resting state? Yes  Focused Assessment No change from prior assessment  Does the patient meet 2 or more of the SIRS criteria? No  MEWS guidelines implemented *See Row Information* No, previously yellow, continue vital signs every 4 hours  Treat  Pain Scale 0-10  Pain Score 4  Take Vital Signs  Increase Vital Sign Frequency  Yellow: Q 2hr X 2 then Q 4hr X 2, if remains yellow, continue Q 4hrs  Assess: SIRS CRITERIA  SIRS Temperature  0  SIRS Pulse 1  SIRS Respirations  0  SIRS WBC 0  SIRS Score Sum  1

## 2022-08-30 NOTE — Progress Notes (Signed)
   08/30/22 0612  Assess: MEWS Score  Temp 98.7 F (37.1 C)  BP (!) 136/90  MAP (mmHg) 104  Pulse Rate (!) 119  Resp 19  SpO2 100 %  O2 Device Room Air  Assess: MEWS Score  MEWS Temp 0  MEWS Systolic 0  MEWS Pulse 2  MEWS RR 0  MEWS LOC 0  MEWS Score 2  MEWS Score Color Yellow  Assess: if the MEWS score is Yellow or Red  Were vital signs taken at a resting state? Yes  Focused Assessment No change from prior assessment  Does the patient meet 2 or more of the SIRS criteria? No  MEWS guidelines implemented *See Row Information* No, previously yellow, continue vital signs every 4 hours  Treat  MEWS Interventions Escalated (See documentation below)  Pain Scale 0-10  Pain Score Asleep  Take Vital Signs  Increase Vital Sign Frequency  Yellow: Q 2hr X 2 then Q 4hr X 2, if remains yellow, continue Q 4hrs  Escalate  MEWS: Escalate Yellow: discuss with charge nurse/RN and consider discussing with provider and RRT  Notify: Charge Nurse/RN  Name of Charge Nurse/RN Notified Letina, RN  Date Charge Nurse/RN Notified 08/30/22  Time Charge Nurse/RN Notified [phone]  Assess: SIRS CRITERIA  SIRS Temperature  0  SIRS Pulse 1  SIRS Respirations  0  SIRS WBC 0  SIRS Score Sum  1   Pt yellow MEWS

## 2022-08-30 NOTE — Progress Notes (Addendum)
PROGRESS NOTE    KANDRA GRAVEN  XLK:[id number] DOB: May 08, 1971 DOA: 08/28/2022 PCP: Pcp, No    Brief Narrative:   Patient is a 51 years old female with past medical history of COPD, active smoker, depression, GERD, hypertension presented to the hospital with diarrhea and nausea for the last 3 to 4 days with intermittent postcoital bleeding abdominal discomfort.  Has not been to a doctor for a long time.  In the ED patient was initially mildly hypotensive and tachycardic.  She had leukocytosis. Patient tested positive for COVID.  CT scan of the abdomen pelvis done in the ED showed multiple findings including abdominal pelvic adenopathy, possible peritoneal carcinomatosis, new bibasilar pulmonary nodules without any obvious source but the scan was without contrast.  Patient was then considered for admission to hospital for acute kidney injury, volume depletion with COVID-19 infection and abnormal CT scan.   Assessment and Plan:  Diarrhea and nausea abdominal discomfort could be secondary to COVID-19 infection.  Continue with IV hydration, symptomatic care with antiemetics.  No stool sample available for GI pathogen panel and C. difficile but has improved at this time.  Significant leukocytosis could be reactive/secondary to UTI.  Urine analysis with RBC ,WBC but nitrite was negative.  On empiric Rocephin.   CT scan shows bladder wall thickening, urine culture showed no growth so far.  WBC today at 11.8.  Initial CBC at 22.3.  Pressure max of 98.8 F.  COVID-19 infection.     No respiratory symptoms except cough, no hypoxia.  Continue symptomatic care.   Acute kidney injury.  Creatinine today at 0.8 from  initial presentation at 2.5.  Baseline around 1.0.  Likely secondary to diarrhea and nausea poor oral intake.  Received IV fluid hydration.  Encourage oral hydration at this time.   Intermittent abdominal pain, postcoital bleeding.  Patient has not had any vaginal exam done in the past.  Possibility  of uterine cancer with metastasis.  Unclear at this time due to lack of contrast.  Will repeat CT scan with IV contrast today to assess for intra-abdominal findings.  Might need biopsy to confirm.   Active cigarette smoking.  Continue nicotine patch.   History of GERD.  Continue PPI.  Mild hypokalemia.  Will replace.  Check levels in AM.   Essential hypertension. Continue amlodipine.  Blood pressure has improved at this time.     DVT prophylaxis: heparin injection 5,000 Units Start: 08/28/22 1600   Code Status:     Code Status: Full Code  Disposition:  Likely home in 1 to 2 days..  Patient does not have a primary care physician and insurance.  TOC consultation for assistance/PCP needs.  Status is: Inpatient  Remains inpatient appropriate because: Acute kidney injury, abnormal CT scan, volume depletion, IV fluids, COVID-19 infection.   Family Communication: Spoke with the patient's husband at bedside  Consultants:  None so far  Procedures:  None  Antimicrobials:  Rocephin IV  Anti-infectives (From admission, onward)    Start     Dose/Rate Route Frequency Ordered Stop   08/29/22 1600  cefTRIAXone (ROCEPHIN) 1 g in sodium chloride 0.9 % 100 mL IVPB        1 g 200 mL/hr over 30 Minutes Intravenous Every 24 hours 08/28/22 1548 08/31/22 1559   08/28/22 1400  cefTRIAXone (ROCEPHIN) 1 g in sodium chloride 0.9 % 100 mL IVPB        1 g 200 mL/hr over 30 Minutes Intravenous  Once 08/28/22 1356 08/28/22  1622        Subjective: Today, patient was seen and examined at bedside.  Feels okay.  Still has some cough with phlegm production.  Has mild lower abdominal pain.  Denies any urinary urgency frequency or dysuria.  No further bowel movements since yesterday. Objective: Vitals:   08/29/22 2116 08/30/22 0612 08/30/22 0644 08/30/22 1007  BP: (!) 135/91 (!) 136/90  136/63  Pulse: (!) 111 (!) 119  (!) 114  Resp: 19 19    Temp: 98.7 F (37.1 C) 98.7 F (37.1 C)  98.2 F (36.8  C)  TempSrc: Oral Oral  Oral  SpO2: 100% 100%  100%  Weight:   86.3 kg   Height:        Intake/Output Summary (Last 24 hours) at 08/30/2022 1049 Last data filed at 08/30/2022 0636 Gross per 24 hour  Intake 2907.02 ml  Output --  Net 2907.02 ml    Filed Weights   08/28/22 1836 08/29/22 0300 08/30/22 0644  Weight: 85 kg 87.7 kg 86.3 kg    Physical Examination:  Body mass index is 32.66 kg/m.  General: Obese built, alert awake and Communicative,  HENT:   No scleral pallor or icterus noted. Oral mucosa is moist Chest:  Clear breath sounds.  Diminished breath sounds bilaterally. No crackles or wheezes.  CVS: S1 &S2 heard. No murmur.  Regular rate and rhythm. Abdomen: Soft, nonspecific tenderness over the lower abdomen, nondistended.  Obese abdomen.  Bowel sounds are heard.   Extremities: No cyanosis, clubbing or edema.  Peripheral pulses are palpable. Psych: Alert, awake and oriented, normal mood CNS:  No cranial nerve deficits.  Power equal in all extremities.   Skin: Warm and dry.  No rashes noted.  Data Reviewed:   CBC: Recent Labs  Lab 08/28/22 1111 08/29/22 0407 08/29/22 1249 08/30/22 0515  WBC 22.3* 16.0* 17.0* 11.8*  NEUTROABS 18.7*  --   --   --   HGB 12.3 10.7* 9.4* 9.3*  HCT 37.9 33.5* 29.5* 28.3*  MCV 80.0 80.5 81.0 79.3*  PLT 308 244 219 243     Basic Metabolic Panel: Recent Labs  Lab 08/28/22 1111 08/29/22 0407 08/30/22 0515  NA 131* 134* 135  K 3.2* 3.7 3.3*  CL 95* 104 106  CO2 22 19* 22  GLUCOSE 114* 85 102*  BUN 22* 22* 9  CREATININE 2.61* 1.60* 0.89  CALCIUM 8.9 8.2* 7.9*  MG  --   --  1.7     Liver Function Tests: Recent Labs  Lab 08/28/22 1111 08/29/22 0407  AST 32 23  ALT 21 15  ALKPHOS 69 56  BILITOT 0.6 0.6  PROT 8.9* 7.3  ALBUMIN 3.5 2.9*      Radiology Studies: CT ABDOMEN PELVIS WO CONTRAST  Result Date: 08/28/2022 CLINICAL DATA:  Abdominal pain. Diarrhea. COVID positive. Weakness. Nausea. Smoker with COPD.  * Tracking Code: BO * EXAM: CT ABDOMEN AND PELVIS WITHOUT CONTRAST TECHNIQUE: Multidetector CT imaging of the abdomen and pelvis was performed following the standard protocol without IV contrast. RADIATION DOSE REDUCTION: This exam was performed according to the departmental dose-optimization program which includes automated exposure control, adjustment of the mA and/or kV according to patient size and/or use of iterative reconstruction technique. COMPARISON:  Chest CT 01/12/2018. Abdominopelvic CT from [facility] [place] 04/03/2019, report only FINDINGS: Lower chest: Centrilobular emphysema. Bilateral subpleural predominant pulmonary nodules. A lingular 8 mm nodule on 01/04 is new since 01/12/2018. A right middle lobe 5 mm nodule on 08/04  is new since 01/12/2018 as is a 1.2 cm right middle lobe nodule on 14/4. Mild cardiomegaly, without pericardial or pleural effusion. Hepatobiliary: Moderate hepatic steatosis. Caudate and lateral segment left liver lobe enlargement. More focal hypoattenuation adjacent to falciform ligament is likely due to focal steatosis. Normal gallbladder, without biliary ductal dilatation. Pancreas: Normal, without mass or ductal dilatation. Spleen: Normal in size, without focal abnormality. Adrenals/Urinary Tract: Normal adrenal glands. No renal calculi or hydronephrosis. No hydroureter or ureteric calculi. No bladder calculi. The bladder is thick walled with mild pericystic edema on 75/2. Stomach/Bowel: Normal stomach, without wall thickening. Fluid-filled colon suggests a diarrheal state. Normal terminal ileum. The appendix is mildly dilated including up to 1.0 cm on 49/5. Normal small bowel. Vascular/Lymphatic: Aortic atherosclerosis. Abdominal retroperitoneal adenopathy. 1.5 cm left para-aortic node on 30/2. Aortocaval nodes of up to 1.1 cm on 42/2. Left external iliac 1.0 cm node on 56/2. Right common iliac 1.3 cm node on 47/2. Reproductive: Equivocal soft tissue fullness within the uterine  cervix on 71/2. No dominant adnexal mass, given limitation of noncontrast technique. Other: No significant free fluid.  No free intraperitoneal air. 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 Musculoskeletal: Remote lower left rib fractures. IMPRESSION: 1. Constellation of findings which is most consistent with metastatic disease, including abdominopelvic adenopathy, peritoneal carcinomatosis, and new bibasilar pulmonary nodules. 2. No definite primary source identified given limitations of noncontrast stone study technique. There is equivocal soft tissue fullness within the uterine cervix and the appendix is mildly dilated without significant surrounding inflammation. These are both potential sites of primary which could be further evaluated via multidisciplinary oncology consultation for eventual contrast-enhanced chest abdomen and pelvic CT and/or PET. 3. Hepatic steatosis with probable cirrhosis. 4. Bladder wall thickening and pericystic edema, suggesting cystitis. 5. Fluid-filled colon suggests a diarrheal state. 6.  Aortic Atherosclerosis (ICD10-I70.0).  Emphysema (ICD10-J43.9). Electronically Signed   By: Abigail Miyamoto M.D.   On: 08/28/2022 13:51   DG Chest Portable 1 View  Result Date: 08/28/2022 CLINICAL DATA:  Cough EXAM: PORTABLE CHEST 1 VIEW COMPARISON:  08/19/2021 FINDINGS: Heart size and vascularity normal. Negative for heart failure. Negative for infiltrate or effusion Possible 1 cm nodule right lung base overlying the diaphragm, not seen on prior studies. IMPRESSION: Possible 1 cm nodule right lung base. Follow-up chest x-ray versus chest CT suggested. Electronically Signed   By: Franchot Gallo M.D.   On: 08/28/2022 11:36      LOS: 2 days   Flora Lipps, MD [facility] Available via Epic secure chat 7am-7pm After these hours, please refer to coverage provider listed on [email or web address] 08/30/2022, 10:49 AM

## 2022-08-30 NOTE — Progress Notes (Signed)
Pt yellow mews through the night. Heart rate and SBP elevated. PRN dilaudid given for pain and ativan given for sleep and anxiety.

## 2022-08-31 DIAGNOSIS — N189 Chronic kidney disease, unspecified: Secondary | ICD-10-CM | POA: Diagnosis not present

## 2022-08-31 DIAGNOSIS — R1900 Intra-abdominal and pelvic swelling, mass and lump, unspecified site: Secondary | ICD-10-CM | POA: Diagnosis not present

## 2022-08-31 DIAGNOSIS — N179 Acute kidney failure, unspecified: Secondary | ICD-10-CM | POA: Diagnosis not present

## 2022-08-31 DIAGNOSIS — E876 Hypokalemia: Secondary | ICD-10-CM

## 2022-08-31 DIAGNOSIS — J449 Chronic obstructive pulmonary disease, unspecified: Secondary | ICD-10-CM | POA: Diagnosis not present

## 2022-08-31 LAB — BASIC METABOLIC PANEL
Anion gap: 6 (ref 5–15)
BUN: 5 mg/dL — ABNORMAL LOW (ref 6–20)
CO2: 24 mmol/L (ref 22–32)
Calcium: 7.9 mg/dL — ABNORMAL LOW (ref 8.9–10.3)
Chloride: 105 mmol/L (ref 98–111)
Creatinine, Ser: 0.66 mg/dL (ref 0.44–1.00)
GFR, Estimated: >60 mL/min (ref >60)
Glucose, Bld: 104 mg/dL — ABNORMAL HIGH (ref 70–99)
Potassium: 3.5 mmol/L (ref 3.5–5.1)
Sodium: 135 mmol/L (ref 135–145)

## 2022-08-31 LAB — GASTROINTESTINAL PANEL BY PCR, STOOL (REPLACES STOOL CULTURE)

## 2022-08-31 LAB — MAGNESIUM: Magnesium: 1.6 mg/dL — ABNORMAL LOW (ref 1.7–2.4)

## 2022-08-31 MED ORDER — GUAIFENESIN-DM 100-10 MG/5ML PO SYRP: 5.0000 mL | ORAL_SOLUTION | ORAL | 0 refills | Status: DC | PRN

## 2022-08-31 MED ORDER — CEPHALEXIN 500 MG PO CAPS: 500.0000 mg | ORAL_CAPSULE | Freq: Three times a day (TID) | ORAL | 0 refills | Status: AC

## 2022-08-31 MED ORDER — AMLODIPINE BESYLATE 10 MG PO TABS: 10.0000 mg | ORAL_TABLET | Freq: Every day | ORAL | 2 refills | Status: DC

## 2022-08-31 MED ORDER — HYDROXYZINE PAMOATE 100 MG PO CAPS: 100.0000 mg | ORAL_CAPSULE | Freq: Three times a day (TID) | ORAL | 0 refills | Status: AC | PRN

## 2022-08-31 MED ORDER — NICOTINE 14 MG/24HR TD PT24: 14.0000 mg | MEDICATED_PATCH | Freq: Every day | TRANSDERMAL | 0 refills | Status: DC

## 2022-08-31 NOTE — Discharge Summary (Signed)
Physician Discharge Summary  Kristin Carson AJO:[id number] DOB: 04-Apr-1971 DOA: 08/28/2022  PCP: Pcp, No  Admit date: 08/28/2022 Discharge date: 08/31/2022  Admitted From: Home  Discharge disposition: Home   Recommendations for Outpatient Follow-Up:   Follow up with your primary care provider in one week.  [facility] to reach out  for appointment. Check CBC, BMP, magnesium in the next visit Patient will need gynecology follow-up with potential cervical biopsy/colposcopy guided and discussion about the potential treatment plan.  Discharge Diagnosis:   Principal Problem:   AKI (acute kidney injury) ([place]) Active Problems:   Essential hypertension   Chronic obstructive pulmonary disease (HCC)   Cigarette nicotine dependence with nicotine-induced disorder   Chronic kidney disease   Volume depletion   Diarrhea   COVID-19 virus infection   Abdominal mass   Hypokalemia Possible cervical cancer  Discharge Condition: Improved.  Diet recommendation: Regular  Wound care: None.  Code status: Full.   History of Present Illness:    Patient is a 51 years old female with past medical history of COPD, active smoker, depression, GERD, hypertension presented to the hospital with diarrhea and nausea for the last 3 to 4 days with intermittent postcoital bleeding abdominal discomfort.  Has not been to a doctor for a long time.  In the ED patient was initially mildly hypotensive and tachycardic.  She had leukocytosis. Patient tested positive for COVID.  CT scan of the abdomen pelvis done in the ED showed multiple findings including abdominal pelvic adenopathy, possible peritoneal carcinomatosis, new bibasilar pulmonary nodules without any obvious source but the scan was without contrast.  Patient was then considered for admission to hospital for acute kidney injury, volume depletion with COVID-19 infection and abnormal CT scan.   Hospital Course:   Following conditions were addressed  during hospitalization as listed below,  Diarrhea and nausea abdominal discomfort could be secondary to COVID-19 infection.  Received IV hydration, symptomatic care with antiemetics.  Improved at this time.  Tolerating oral diet.  Significant leukocytosis could be reactive/secondary to UTI.  Received Rocephin.  CT scan showed some bladder wall thickening.  Leukocytosis has trended down.  Will continue Keflex for next few days to complete course.  COVID-19 infection.     No respiratory symptoms except cough, no hypoxia.  Continue symptomatic care  Acute kidney injury.  Likely secondary to volume depletion from poor oral intake and diarrhea.  Has improved at this time after IV fluids.    Intermittent abdominal pain, postcoital bleeding.  Patient has not had any vaginal exam done in the past.  Possibility of uterine cancer with metastasis.  Unclear at this time due to lack of contras repeat CT scan with IV contrast on 08/30/2022 shows locally advanced lower uterine/cervical neoplasm with possible involvement of urinary bladder, metastatic abdominal pelvic lymph nodes with peritoneal carcinomatosis and small volume ascites and pulmonary nodules in the bilateral lung base possibility of metastatic disease.  I had a prolonged discussion with the patient as well as patient's husband at bedside.  At this time patient does not have a primary care physician or insurance and spoke with [facility] for assistance.  I also spoke with the gynecology team to provide her assistance with outpatient follow-up and referral to [facility].  [facility] is supposed to follow-up with the patient for further support including PCP medications and referrals.   Active cigarette smoking.  Nicotine patch.  Would prescribe on discharge.     Mild hypokalemia.  Improved after replacement.  Essential hypertension. Continue amlodipine.  Prescription has been given.  Disposition.  At this time, patient is stable for disposition  home with outpatient PCP/gynecology follow-up.  Spoke with the patient's husband at bedside at length.  Medical Consultants:   None.  Procedures:    None. Subjective:   Today, patient was seen and examined at bedside.  Inquiring about her diagnosis.  Feels tearful.  Has any nausea vomiting or overt abdominal pain.  Has been tolerating oral diet.  Discharge Exam:   Vitals:   08/30/22 2220 08/31/22 0604  BP:  (!) 135/90  Pulse: (!) 108 (!) 109  Resp:  20  Temp:  98 F (36.7 C)  SpO2:  96%   Vitals:   08/30/22 2216 08/30/22 2220 08/31/22 0604 08/31/22 0700  BP: 123/83  (!) 135/90   Pulse: (!) 116 (!) 108 (!) 109   Resp: 20  20   Temp: 98.7 F (37.1 C)  98 F (36.7 C)   TempSrc: Oral     SpO2: 99%  96%   Weight:    90.2 kg  Height:       Body mass index is 34.13 kg/m.  General: Alert awake, not in obvious distress, slightly anxious and tearful.,  Obese built HENT: pupils equally reacting to light,  No scleral pallor or icterus noted. Oral mucosa is moist.  Chest:  Clear breath sounds.   No crackles or wheezes.  CVS: S1 &S2 heard. No murmur.  Regular rate and rhythm. Abdomen: Soft, nonspecific lower abdominal tenderness noted, nondistended.  Bowel sounds are heard.   Extremities: No cyanosis, clubbing or edema.  Peripheral pulses are palpable. Psych: Alert, awake and oriented, normal mood CNS:  No cranial nerve deficits.  Power equal in all extremities.   Skin: Warm and dry.  No rashes noted.  The results of significant diagnostics from this hospitalization (including imaging, microbiology, ancillary and laboratory) are listed below for reference.     Diagnostic Studies:   CT ABDOMEN PELVIS WO CONTRAST  Result Date: 08/28/2022 CLINICAL DATA:  Abdominal pain. Diarrhea. COVID positive. Weakness. Nausea. Smoker with COPD. * Tracking Code: BO * EXAM: CT ABDOMEN AND PELVIS WITHOUT CONTRAST TECHNIQUE: Multidetector CT imaging of the abdomen and pelvis was performed  following the standard protocol without IV contrast. RADIATION DOSE REDUCTION: This exam was performed according to the departmental dose-optimization program which includes automated exposure control, adjustment of the mA and/or kV according to patient size and/or use of iterative reconstruction technique. COMPARISON:  Chest CT 01/12/2018. Abdominopelvic CT from [facility] rocking ham 04/03/2019, report only FINDINGS: Lower chest: Centrilobular emphysema. Bilateral subpleural predominant pulmonary nodules. A lingular 8 mm nodule on 01/04 is new since 01/12/2018. A right middle lobe 5 mm nodule on 08/04 is new since 01/12/2018 as is a 1.2 cm right middle lobe nodule on 14/4. Mild cardiomegaly, without pericardial or pleural effusion. Hepatobiliary: Moderate hepatic steatosis. Caudate and lateral segment left liver lobe enlargement. More focal hypoattenuation adjacent to falciform ligament is likely due to focal steatosis. Normal gallbladder, without biliary ductal dilatation. Pancreas: Normal, without mass or ductal dilatation. Spleen: Normal in size, without focal abnormality. Adrenals/Urinary Tract: Normal adrenal glands. No renal calculi or hydronephrosis. No hydroureter or ureteric calculi. No bladder calculi. The bladder is thick walled with mild pericystic edema on 75/2. Stomach/Bowel: Normal stomach, without wall thickening. Fluid-filled colon suggests a diarrheal state. Normal terminal ileum. The appendix is mildly dilated including up to 1.0 cm on 49/5. Normal small bowel. Vascular/Lymphatic: Aortic atherosclerosis. Abdominal  retroperitoneal adenopathy. 1.5 cm left para-aortic node on 30/2. Aortocaval nodes of up to 1.1 cm on 42/2. Left external iliac 1.0 cm node on 56/2. Right common iliac 1.3 cm node on 47/2. Reproductive: Equivocal soft tissue fullness within the uterine cervix on 71/2. No dominant adnexal mass, given limitation of noncontrast technique. Other: No significant free fluid.  No free  intraperitoneal air. 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 Musculoskeletal: Remote lower left rib fractures. IMPRESSION: 1. Constellation of findings which is most consistent with metastatic disease, including abdominopelvic adenopathy, peritoneal carcinomatosis, and new bibasilar pulmonary nodules. 2. No definite primary source identified given limitations of noncontrast stone study technique. There is equivocal soft tissue fullness within the uterine cervix and the appendix is mildly dilated without significant surrounding inflammation. These are both potential sites of primary which could be further evaluated via multidisciplinary oncology consultation for eventual contrast-enhanced chest abdomen and pelvic CT and/or PET. 3. Hepatic steatosis with probable cirrhosis. 4. Bladder wall thickening and pericystic edema, suggesting cystitis. 5. Fluid-filled colon suggests a diarrheal state. 6.  Aortic Atherosclerosis (ICD10-I70.0).  Emphysema (ICD10-J43.9). Electronically Signed   By: Abigail Miyamoto M.D.   On: 08/28/2022 13:51   DG Chest Portable 1 View  Result Date: 08/28/2022 CLINICAL DATA:  Cough EXAM: PORTABLE CHEST 1 VIEW COMPARISON:  08/19/2021 FINDINGS: Heart size and vascularity normal. Negative for heart failure. Negative for infiltrate or effusion Possible 1 cm nodule right lung base overlying the diaphragm, not seen on prior studies. IMPRESSION: Possible 1 cm nodule right lung base. Follow-up chest x-ray versus chest CT suggested. Electronically Signed   By: Franchot Gallo M.D.   On: 08/28/2022 11:36     Labs:   Basic Metabolic Panel: Recent Labs  Lab 08/28/22 1111 08/29/22 0407 08/30/22 0515 08/31/22 0635  NA 131* 134* 135 135  K 3.2* 3.7 3.3* 3.5  CL 95* 104 106 105  CO2 22 19* 22 24  GLUCOSE 114* 85 102* 104*  BUN 22* 22* 9 5*  CREATININE 2.61* 1.60* 0.89 0.66  CALCIUM 8.9 8.2* 7.9* 7.9*  MG  --   --  1.7  1.6*   GFR Estimated Creatinine Clearance: 92.5 mL/min (by C-G formula based on SCr of 0.66 mg/dL). Liver Function Tests: Recent Labs  Lab 08/28/22 1111 08/29/22 0407  AST 32 23  ALT 21 15  ALKPHOS 69 56  BILITOT 0.6 0.6  PROT 8.9* 7.3  ALBUMIN 3.5 2.9*   Recent Labs  Lab 08/28/22 1111  LIPASE 22   No results for input(s): "AMMONIA" in the last 168 hours. Coagulation profile Recent Labs  Lab 08/29/22 0407  INR 1.0    CBC: Recent Labs  Lab 08/28/22 1111 08/29/22 0407 08/29/22 1249 08/30/22 0515 08/30/22 1126  WBC 22.3* 16.0* 17.0* 11.8* 12.4*  NEUTROABS 18.7*  --   --   --   --   HGB 12.3 10.7* 9.4* 9.3* 8.5*  HCT 37.9 33.5* 29.5* 28.3* 26.9*  MCV 80.0 80.5 81.0 79.3* 80.3  PLT 308 244 219 243 244   Cardiac Enzymes: Recent Labs  Lab 08/28/22 1111  CKTOTAL 41   BNP: Invalid input(s): "POCBNP" CBG: Recent Labs  Lab 08/28/22 1046  GLUCAP 129*   D-Dimer No results for input(s): "DDIMER" in the last 72 hours. Hgb A1c Recent Labs    08/29/22 0407  HGBA1C 5.3   Lipid Profile No results for input(s): "CHOL", "HDL", "LDLCALC", "TRIG", "CHOLHDL", "LDLDIRECT" in the  last 72 hours. Thyroid function studies Recent Labs    08/29/22 0407  TSH 3.375   Anemia work up No results for input(s): "VITAMINB12", "FOLATE", "FERRITIN", "TIBC", "IRON", "RETICCTPCT" in the last 72 hours. Microbiology Recent Results (from the past 240 hour(s))  Resp panel by RT-PCR (RSV, Flu A&B, Covid) Urine, Clean Catch     Status: Abnormal   Collection Time: 08/28/22 10:45 AM   Specimen: Urine, Clean Catch; Nasal Swab  Result Value Ref Range Status   SARS Coronavirus 2 by RT PCR POSITIVE (A) NEGATIVE Final    Comment: (NOTE) SARS-CoV-2 target nucleic acids are DETECTED.  The SARS-CoV-2 RNA is generally detectable in upper respiratory specimens during the acute phase of infection. Positive results are indicative of the presence of the identified virus, but do not rule out  bacterial infection or co-infection with other pathogens not detected by the test. Clinical correlation with patient history and other diagnostic information is necessary to determine patient infection status. The expected result is Negative.  Fact Sheet for Patients: EntrepreneurPulse.com.au  Fact Sheet for Healthcare Providers: IncredibleEmployment.be  This test is not yet approved or cleared by the [place] FDA and  has been authorized for detection and/or diagnosis of SARS-CoV-2 by FDA under an Emergency Use Authorization (EUA).  This EUA will remain in effect (meaning this test can be used) for the duration of  the COVID-19 declaration under Section 564(b)(1) of the A ct, 21 U.S.C. section 360bbb-3(b)(1), unless the authorization is terminated or revoked sooner.     Influenza A by PCR NEGATIVE NEGATIVE Final   Influenza B by PCR NEGATIVE NEGATIVE Final    Comment: (NOTE) The Xpert Xpress SARS-CoV-2/FLU/RSV plus assay is intended as an aid in the diagnosis of influenza from Nasopharyngeal swab specimens and should not be used as a sole basis for treatment. Nasal washings and aspirates are unacceptable for Xpert Xpress SARS-CoV-2/FLU/RSV testing.  Fact Sheet for Patients: EntrepreneurPulse.com.au  Fact Sheet for Healthcare Providers: IncredibleEmployment.be  This test is not yet approved or cleared by the [place] FDA and has been authorized for detection and/or diagnosis of SARS-CoV-2 by FDA under an Emergency Use Authorization (EUA). This EUA will remain in effect (meaning this test can be used) for the duration of the COVID-19 declaration under Section 564(b)(1) of the Act, 21 U.S.C. section 360bbb-3(b)(1), unless the authorization is terminated or revoked.     Resp Syncytial Virus by PCR NEGATIVE NEGATIVE Final    Comment: (NOTE) Fact Sheet for  Patients: EntrepreneurPulse.com.au  Fact Sheet for Healthcare Providers: IncredibleEmployment.be  This test is not yet approved or cleared by the [place] FDA and has been authorized for detection and/or diagnosis of SARS-CoV-2 by FDA under an Emergency Use Authorization (EUA). This EUA will remain in effect (meaning this test can be used) for the duration of the COVID-19 declaration under Section 564(b)(1) of the Act, 21 U.S.C. section 360bbb-3(b)(1), unless the authorization is terminated or revoked.  Performed at [facility], [address], [place], [place] [address]   Urine Culture     Status: Abnormal   Collection Time: 08/28/22  4:50 PM   Specimen: Urine, Clean Catch  Result Value Ref Range Status   Specimen Description   Final    URINE, CLEAN CATCH Performed at [facility], [address] [place],  [address]    Special Requests   Final    NONE Performed at [facility], [address] [place],  [address]    Culture (A)  Final    10,000 COLONIES/mL STREPTOCOCCUS GROUP C Beta hemolytic streptococci are predictably susceptible to penicillin and other beta lactams. Susceptibility testing not routinely performed. Performed at [facility], [place] [address], [place], [place] [address]    Report Status 08/30/2022 FINAL  Final  C Difficile Quick Screen w PCR reflex     Status: None   Collection Time: 08/30/22 11:38 AM   Specimen: Stool  Result Value Ref Range Status   C Diff antigen NEGATIVE NEGATIVE Final   C Diff toxin NEGATIVE NEGATIVE Final   C Diff interpretation No C. difficile detected.  Final    Comment: Performed at [facility], [address], [place], [place] [address]     Discharge Instructions:   Discharge Instructions     Ambulatory referral to Obstetrics / Gynecology   Complete by: As directed    Possible uterine cancer, needs assessment biopsy   Call MD for:  persistant nausea and vomiting    Complete by: As directed    Call MD for:  severe uncontrolled pain   Complete by: As directed    Diet general   Complete by: As directed    Discharge instructions   Complete by: As directed    Follow-up with your primary care provider when scheduled by [facility].  Discuss about CT scan of your abdomen being abnormal and need for further testing.  Follow-up with gynecology as outpatient when setup for biopsy and further workup.  Internal referral has been made to gynecology..  Please call [facility] on the phone listed to see if they can see you. Seek medical attention for worsening symptoms.   Increase activity slowly   Complete by: As directed       Allergies as of 08/31/2022       Reactions   Carrot [daucus [place]ota] Hives   Lisinopril-hydrochlorothiazide    Oral swelling   Ibuprofen Other (See Comments)   Oral swelling   Tramadol Hives   Erythromycin Hives        Medication List     STOP taking these medications    metroNIDAZOLE 500 MG tablet Commonly known as: Flagyl       TAKE these medications    amLODipine 10 MG tablet Commonly known as: NORVASC Take 1 tablet (10 mg total) by mouth daily.   cephALEXin 500 MG capsule Commonly known as: KEFLEX Take 1 capsule (500 mg total) by mouth 3 (three) times daily for 3 days.   guaiFENesin-dextromethorphan 100-10 MG/5ML syrup Commonly known as: ROBITUSSIN DM Take 5 mLs by mouth every 4 (four) hours as needed for cough.   hydrOXYzine 100 MG capsule Commonly known as: VISTARIL Take 1 capsule (100 mg total) by mouth 3 (three) times daily as needed for up to 5 days for itching.   nicotine 14 mg/24hr patch Commonly known as: NICODERM CQ - dosed in mg/24 hours Place 1 patch (14 mg total) onto the skin daily.        Follow-up Information     [facility] Follow up.   Why: They will call to schedule your first appointment. Contact information: [facility] Follow up.    Specialty: Obstetrics and Gynecology Contact information: [address] [place] [phone]                 Time coordinating discharge: 39 minutes  Signed:  Sartaj Hoskin  [facility] 08/31/2022, 9:47 AM

## 2022-08-31 NOTE — TOC Transition Note (Signed)
Transition of Care [facility]) - CM/SW Discharge Note   Patient Details  Name: Kristin Carson MRN: [id number] Date of Birth: 06/30/1971  [facility] CM/SW Contact:  Boneta Lucks, RN Phone Number: 08/31/2022, 11:00 AM   Clinical Narrative:   Patient discharging home today. TOC left a second message for [facility] with referral. They are closed until Tuesday for Holiday.  MD put in charity consult for [facility].    Final next level of care: Home/Self Care Barriers to Discharge: Continued Medical Work up   Patient Goals and CMS Choice CMS Medicare.gov Compare Post Acute Care list provided to:: Patient   Discharge Placement      Patient and family notified of of transfer: 08/31/22  Discharge Plan and Services Additional resources added to the After Visit Summary for       Social Determinants of Health (SDOH) Interventions SDOH Screenings   Tobacco Use: High Risk (08/28/2022)     Readmission Risk Interventions    08/29/2022    2:18 PM  Readmission Risk Prevention Plan  Transportation Screening Complete  PCP or Specialist Appt within 5-7 Days Complete  Home Care Screening Complete  Medication Review (RN CM) Complete

## 2022-09-01 DIAGNOSIS — Z419 Encounter for procedure for purposes other than remedying health state, unspecified: Secondary | ICD-10-CM | POA: Diagnosis not present

## 2022-09-02 ENCOUNTER — Telehealth: Payer: Self-pay

## 2022-09-02 NOTE — Telephone Encounter (Signed)
Received referral from Wells Guiles at [facility] to reach out to patient and discuss finding a PCP. Patient has active medicaid benefits and will not qualify for [facility] program.

## 2022-09-09 ENCOUNTER — Encounter: Payer: Self-pay | Admitting: Obstetrics & Gynecology

## 2022-09-09 ENCOUNTER — Other Ambulatory Visit (HOSPITAL_COMMUNITY)
Admission: RE | Admit: 2022-09-09 | Discharge: 2022-09-09 | Disposition: A | Discharge status: Home / Self Care | Payer: Medicaid Other | Source: Ambulatory Visit | Attending: Obstetrics & Gynecology | Admitting: Obstetrics & Gynecology

## 2022-09-09 ENCOUNTER — Ambulatory Visit (INDEPENDENT_AMBULATORY_CARE_PROVIDER_SITE_OTHER): Payer: Medicaid Other | Admitting: Obstetrics & Gynecology

## 2022-09-09 VITALS — BP 123/78 | HR 113 | Ht 65.5 in | Wt 185.0 lb

## 2022-09-09 DIAGNOSIS — C786 Secondary malignant neoplasm of retroperitoneum and peritoneum: Secondary | ICD-10-CM | POA: Diagnosis not present

## 2022-09-09 DIAGNOSIS — N939 Abnormal uterine and vaginal bleeding, unspecified: Secondary | ICD-10-CM | POA: Insufficient documentation

## 2022-09-09 DIAGNOSIS — C539 Malignant neoplasm of cervix uteri, unspecified: Secondary | ICD-10-CM | POA: Diagnosis not present

## 2022-09-09 DIAGNOSIS — R319 Hematuria, unspecified: Secondary | ICD-10-CM

## 2022-09-09 DIAGNOSIS — Z124 Encounter for screening for malignant neoplasm of cervix: Secondary | ICD-10-CM | POA: Insufficient documentation

## 2022-09-09 DIAGNOSIS — N840 Polyp of corpus uteri: Secondary | ICD-10-CM

## 2022-09-09 DIAGNOSIS — N72 Inflammatory disease of cervix uteri: Secondary | ICD-10-CM | POA: Diagnosis not present

## 2022-09-09 DIAGNOSIS — C541 Malignant neoplasm of endometrium: Secondary | ICD-10-CM | POA: Diagnosis not present

## 2022-09-09 DIAGNOSIS — N879 Dysplasia of cervix uteri, unspecified: Secondary | ICD-10-CM | POA: Diagnosis not present

## 2022-09-09 DIAGNOSIS — C55 Malignant neoplasm of uterus, part unspecified: Secondary | ICD-10-CM

## 2022-09-09 NOTE — Progress Notes (Signed)
GYN VISIT Patient name: Kristin Carson MRN [id number]  Date of birth: 08/09/1971 Chief Complaint:   Follow-up (Abdominal mass, abnormal bleeding)  History of Present Illness:   Kristin Carson is a 51 y.o. G48P1001 female being seen today for the following concerns:  Pt seen in ED on 12/30 due to abdominal pain- CT suggestive of metastatic cancer- likely uterine in nature.     Menses are still regular each month.  About 6 mos ago, she noted that she started to have daily bleeding on/off.  Bleeding is sometimes brown to bright red, not heavy, wears a liner daily.  Notes occasional pelvic pain, but mostly worsening dysmenorrhea.    Some nausea if she waits too long to eat.  No vomiting.  Occasional constipation.   Recently noting blood in her urine for the past 2-3 weeks.  Last pap 2018 (outside facility)- negative, +Trich  Patient's last menstrual period was 08/15/2022 (approximate).     09/09/2022    9:10 AM  Depression screen PHQ 2/9  Decreased Interest 0  Down, Depressed, Hopeless 1  PHQ - 2 Score 1  Altered sleeping 0  Tired, decreased energy 3  Change in appetite 0  Feeling bad or failure about yourself  0  Trouble concentrating 0  Moving slowly or fidgety/restless 0  Suicidal thoughts 0  PHQ-9 Score 4     Review of Systems:   Pertinent items are noted in HPI Denies fever/chills, dizziness, headaches, visual disturbances, fatigue, shortness of breath, chest pain. Pertinent History Reviewed:  Reviewed past medical,surgical, social, obstetrical and family history.  Reviewed problem list, medications and allergies. Physical Assessment:   Vitals:   09/09/22 0901  BP: 123/78  Pulse: (!) 113  Weight: 185 lb (83.9 kg)  Height: 5' 5.5" (1.664 m)  Body mass index is 30.32 kg/m.       Physical Examination:   General appearance: alert, well appearing, and in no distress  Psych: mood appropriate, normal affect  Skin: warm & dry   Cardiovascular: normal heart rate  noted  Respiratory: normal respiratory effort, no distress  Abdomen: soft, non-tender, no rebound, no guarding, no discrete mass appreciated  Pelvic: VULVA: normal appearing vulva with no masses, tenderness or lesions, VAGINA: normal appearing vagina with normal color and discharge, no lesions, CERVIX: irregular shaped cervix, no discrete mass appreciated.  Pap collected.  Uterine bleeding noted with manipulation of cervix.  On bimanual exam- fixed enlarged uterus with extension to right lateral side wall  Extremities: no edema, no calf tenderness bilaterally  Chaperone: Alice Rieger    Endometrial Biopsy Procedure Note  Pre-operative Diagnosis: AUB, uterine carcinoma  Post-operative Diagnosis: same  Procedure Details  The risks (including infection, bleeding, pain, and uterine perforation) and benefits of the procedure were explained to the patient and Written informed consent was obtained.  Antibiotic prophylaxis against endocarditis was not indicated.   The patient was placed in the dorsal lithotomy position.  Bimanual exam showed the uterus to be in the neutral position.  A speculum inserted in the vagina, and the cervix prepped with betadine.     A single tooth tenaculum was applied to the anterior lip of the cervix for stabilization.  A Pipelle endometrial aspirator was used to sample the endometrium.  Sample was sent for pathologic examination.  Additional cervical biopsy was also obtained.  Monsels applied for hemostasis.  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

## 2022-09-09 NOTE — Addendum Note (Signed)
Addended by: Octaviano Glow on: 09/09/2022 10:45 AM   Modules accepted: Orders

## 2022-09-10 ENCOUNTER — Telehealth: Payer: Self-pay | Admitting: Oncology

## 2022-09-10 NOTE — Telephone Encounter (Signed)
Kristin Carson and scheduled her for a new patient appointment with Dr. Berline Lopes on 09/19/22 at 10:30 with a 10:00 arrival.  Asked if she would like to have treatment (chemotherapy) at [facility] or [facility].  She would like treatment at [facility] because she does not drive and has issues with transportation.

## 2022-09-12 LAB — CYTOLOGY - PAP
Chlamydia: NEGATIVE
Comment: NEGATIVE
Comment: NEGATIVE
Comment: NEGATIVE
Comment: NEGATIVE
Comment: NEGATIVE
Comment: NORMAL
Diagnosis: HIGH — AB
HPV 16: NEGATIVE
HPV 18 / 45: POSITIVE — AB
High risk HPV: POSITIVE — AB
Neisseria Gonorrhea: NEGATIVE
Trichomonas: NEGATIVE

## 2022-09-12 LAB — SURGICAL PATHOLOGY

## 2022-09-15 ENCOUNTER — Encounter: Payer: Self-pay | Admitting: Oncology

## 2022-09-15 ENCOUNTER — Telehealth: Payer: Self-pay | Admitting: Obstetrics & Gynecology

## 2022-09-15 NOTE — Progress Notes (Signed)
Requested PD-L1 on accession [id number] with [facility] via email.

## 2022-09-15 NOTE — Telephone Encounter (Signed)
Tried calling x 2 no response. Did not leave voicemail- as name recorded did not match.  Checked to confirm correct number  Pt already scheduled with gyn/onc  Janyth Pupa, DO Attending [place], [facility] for [facility], [place]

## 2022-09-16 ENCOUNTER — Encounter: Payer: Self-pay | Admitting: Gynecologic Oncology

## 2022-09-18 NOTE — Progress Notes (Unsigned)
Briefly, this is a patient with stage IVb cervical cancer.  She was scheduled to see me for new patient consultation today.  Patient's husband called shortly before her scheduled visit with me today to let us know that she was currently in jail.  I have asked Santiago Glad, our nurse navigator, to determine where she is and whether we can get in touch with her lawyer.  Given her metastatic disease, it is of utmost importance that we figure out how to get her rescheduled with both myself as well as medical oncology.  Jeral Pinch MD Gynecologic Oncology

## 2022-09-19 ENCOUNTER — Telehealth: Payer: Self-pay | Admitting: Oncology

## 2022-09-19 ENCOUNTER — Inpatient Hospital Stay: Payer: Medicaid Other | Admitting: Gynecologic Oncology

## 2022-09-19 DIAGNOSIS — C539 Malignant neoplasm of cervix uteri, unspecified: Secondary | ICD-10-CM | POA: Insufficient documentation

## 2022-09-19 DIAGNOSIS — C7800 Secondary malignant neoplasm of unspecified lung: Secondary | ICD-10-CM | POA: Insufficient documentation

## 2022-09-19 NOTE — Telephone Encounter (Signed)
Kristin Carson regarding her appointment with Dr. Berline Lopes today.  Her husband [name] answered her phone and said she is in the [facility].  He is wondering how soon she can reschedule. He does not know if she has an attorney and would not say what happened.  Called the [facility] and left a message for the nurse to see if the patient can be transported to the cancer center to be seen.

## 2022-09-21 NOTE — Telephone Encounter (Signed)
Called [place] and she has been released.  Advised her that we are working to get her rescheduled with Dr. Berline Lopes and we will call her back with the appointment.

## 2022-09-22 ENCOUNTER — Inpatient Hospital Stay: Payer: Medicaid Other | Attending: Hematology | Admitting: Hematology

## 2022-09-22 ENCOUNTER — Other Ambulatory Visit: Payer: Self-pay

## 2022-09-22 ENCOUNTER — Encounter: Payer: Self-pay | Admitting: Hematology

## 2022-09-22 ENCOUNTER — Inpatient Hospital Stay: Payer: Medicaid Other

## 2022-09-22 VITALS — BP 136/84 | HR 117 | Temp 97.5°F | Resp 19 | Ht 72.0 in | Wt 184.1 lb

## 2022-09-22 DIAGNOSIS — C538 Malignant neoplasm of overlapping sites of cervix uteri: Secondary | ICD-10-CM

## 2022-09-22 DIAGNOSIS — C787 Secondary malignant neoplasm of liver and intrahepatic bile duct: Secondary | ICD-10-CM | POA: Diagnosis not present

## 2022-09-22 DIAGNOSIS — C7802 Secondary malignant neoplasm of left lung: Secondary | ICD-10-CM | POA: Diagnosis not present

## 2022-09-22 DIAGNOSIS — F17210 Nicotine dependence, cigarettes, uncomplicated: Secondary | ICD-10-CM

## 2022-09-22 DIAGNOSIS — R109 Unspecified abdominal pain: Secondary | ICD-10-CM

## 2022-09-22 DIAGNOSIS — C7801 Secondary malignant neoplasm of right lung: Secondary | ICD-10-CM

## 2022-09-22 DIAGNOSIS — I10 Essential (primary) hypertension: Secondary | ICD-10-CM

## 2022-09-22 DIAGNOSIS — Z801 Family history of malignant neoplasm of trachea, bronchus and lung: Secondary | ICD-10-CM | POA: Diagnosis not present

## 2022-09-22 DIAGNOSIS — C539 Malignant neoplasm of cervix uteri, unspecified: Secondary | ICD-10-CM | POA: Diagnosis not present

## 2022-09-22 DIAGNOSIS — D649 Anemia, unspecified: Secondary | ICD-10-CM

## 2022-09-22 DIAGNOSIS — C786 Secondary malignant neoplasm of retroperitoneum and peritoneum: Secondary | ICD-10-CM

## 2022-09-22 DIAGNOSIS — R87810 Cervical high risk human papillomavirus (HPV) DNA test positive: Secondary | ICD-10-CM

## 2022-09-22 LAB — COMPREHENSIVE METABOLIC PANEL
ALT: 10 U/L (ref 0–44)
AST: 19 U/L (ref 15–41)
Albumin: 3.1 g/dL — ABNORMAL LOW (ref 3.5–5.0)
Alkaline Phosphatase: 68 U/L (ref 38–126)
Anion gap: 6 (ref 5–15)
BUN: 14 mg/dL (ref 6–20)
CO2: 24 mmol/L (ref 22–32)
Calcium: 8.4 mg/dL — ABNORMAL LOW (ref 8.9–10.3)
Chloride: 102 mmol/L (ref 98–111)
Creatinine, Ser: 0.95 mg/dL (ref 0.44–1.00)
GFR, Estimated: >60 mL/min (ref >60)
Glucose, Bld: 102 mg/dL — ABNORMAL HIGH (ref 70–99)
Potassium: 4.5 mmol/L (ref 3.5–5.1)
Sodium: 132 mmol/L — ABNORMAL LOW (ref 135–145)
Total Bilirubin: 0.3 mg/dL (ref 0.3–1.2)
Total Protein: 8 g/dL (ref 6.5–8.1)

## 2022-09-22 LAB — FERRITIN: Ferritin: 35 ng/mL (ref 11–307)

## 2022-09-22 LAB — CBC WITH DIFFERENTIAL/PLATELET
Abs Immature Granulocytes: 0.06 10*3/uL (ref 0.00–0.07)
Basophils Absolute: 0 10*3/uL (ref 0.0–0.1)
Basophils Relative: 0 %
Eosinophils Absolute: 0.3 10*3/uL (ref 0.0–0.5)
Eosinophils Relative: 3 %
HCT: 29 % — ABNORMAL LOW (ref 36.0–46.0)
Hemoglobin: 9.2 g/dL — ABNORMAL LOW (ref 12.0–15.0)
Immature Granulocytes: 1 %
Lymphocytes Relative: 14 %
Lymphs Abs: 1.5 10*3/uL (ref 0.7–4.0)
MCH: 25.3 pg — ABNORMAL LOW (ref 26.0–34.0)
MCHC: 31.7 g/dL (ref 30.0–36.0)
MCV: 79.9 fL — ABNORMAL LOW (ref 80.0–100.0)
Monocytes Absolute: 1.1 10*3/uL — ABNORMAL HIGH (ref 0.1–1.0)
Monocytes Relative: 10 %
Neutro Abs: 8.1 10*3/uL — ABNORMAL HIGH (ref 1.7–7.7)
Neutrophils Relative %: 72 %
Platelets: 381 10*3/uL (ref 150–400)
RBC: 3.63 MIL/uL — ABNORMAL LOW (ref 3.87–5.11)
RDW: 17.5 % — ABNORMAL HIGH (ref 11.5–15.5)
WBC: 11 10*3/uL — ABNORMAL HIGH (ref 4.0–10.5)
nRBC: 0 % (ref 0.0–0.2)

## 2022-09-22 LAB — HIV ANTIBODY (ROUTINE TESTING W REFLEX): HIV Screen 4th Generation wRfx: NONREACTIVE

## 2022-09-22 LAB — IRON AND TIBC
Iron: 27 ug/dL — ABNORMAL LOW (ref 28–170)
Saturation Ratios: 7 % — ABNORMAL LOW (ref 10.4–31.8)
TIBC: 412 ug/dL (ref 250–450)
UIBC: 385 ug/dL

## 2022-09-22 MED ORDER — HYDROCODONE-ACETAMINOPHEN 5-325 MG PO TABS: 1.0000 | ORAL_TABLET | Freq: Two times a day (BID) | ORAL | 0 refills | Status: DC | PRN

## 2022-09-22 NOTE — Patient Instructions (Addendum)
[facility]  Discharge Instructions  You were seen and examined today by Dr. Delton Coombes. Dr. Delton Coombes is a medical oncologist, meaning that he specializes in the treatment of cancer diagnoses. Dr. Delton Coombes discussed your past medical history, family history of cancers, and the events that led to you being here today.  You were referred to Dr. Delton Coombes due to a new diagnosis of Stage IV cervical cancer. The cancer started in your cervix and has spread to the lymph nodes, lung and possibly liver. This means that the cancer is not curable but it is manageable with chemotherapy. The goal of chemotherapy is to control the spread of the cancer.  Dr. Delton Coombes has recommended an additional CT scan of your chest and a bone scan to complete the staging work-up. Dr. Delton Coombes has also requested additional labs today.  Dr. Delton Coombes will send your biopsy for additional testing.  Prior to the start of chemotherapy, Dr. Delton Coombes will order for you to have a Port-A-Cath placed by the interventional radiologist in [place].  Follow-up as scheduled.  Thank you for choosing [facility] to provide your oncology and hematology care.   To afford each patient quality time with our provider, please arrive at least 15 minutes before your scheduled appointment time. You may need to reschedule your appointment if you arrive late (10 or more minutes). Arriving late affects you and other patients whose appointments are after yours.  Also, if you miss three or more appointments without notifying the office, you may be dismissed from the clinic at the provider's discretion.    Again, thank you for choosing [facility].  Our hope is that these requests will decrease the amount of time that you wait before being seen by our physicians.   If you have a lab appointment with the [facility] please come in thru the Main Entrance and check in at the  main information desk.           _____________________________________________________________  Should you have questions after your visit to [facility], please contact our office at [phone] and follow the prompts.  Our office hours are 8:00 a.m. to 4:30 p.m. Monday - Thursday and 8:00 a.m. to 2:30 p.m. Friday.  Please note that voicemails left after 4:00 p.m. may not be returned until the following business day.  We are closed weekends and all major holidays.  You do have access to a nurse 24-7, just call the main number to the clinic [phone] and do not press any options, hold on the line and a nurse will answer the phone.    For prescription refill requests, have your pharmacy contact our office and allow 72 hours.    Masks are optional in the cancer centers. If you would like for your care team to wear a mask while they are taking care of you, please let them know. You may have one support person who is at least 50 years old accompany you for your appointments.

## 2022-09-22 NOTE — Progress Notes (Signed)
I met with the patient and her roommate today during and following initial visit with Dr. Delton Coombes. I introduced myself and explained my role in the patient's care. I provided my contact information and encouraged the patient to call with questions or concerns.

## 2022-09-22 NOTE — Telephone Encounter (Signed)
Kristin Carson and notified her of appointment with Dr. Berline Lopes on 10/10/22 at 10:30.  She verbalized understanding and agreement of appointment.

## 2022-09-22 NOTE — Progress Notes (Signed)
[facility] NOTE  Patient Care Team: Pcp, No as PCP - General Derek Jack, MD as Medical Oncologist (Medical Oncology) Brien Mates, RN as Oncology Nurse Navigator (Medical Oncology)  CHIEF COMPLAINTS/PURPOSE OF CONSULTATION:  Metastatic cervical squamous cell carcinoma.  HISTORY OF PRESENTING ILLNESS:  Kristin Carson 52 y.o. female is seen in consultation today at the request of Dr. Berline Lopes for newly diagnosed metastatic cervical squamous cell carcinoma.  She has been having vaginal bleeding on and off for the last 6 to 9 months.  She also reported suprapubic pain which radiates to lower back and left leg.  She does not report any weight loss.  Denies any tingling or numbness in the extremities or ringing in the ears or decreased hearing.  Has occasional headaches and good appetite.  A CT scan of the abdomen and pelvis on 08/30/2022 showed uterine/cervical mass with metastatic abdominal pelvic lymph nodes, multiple lung nodules, 1 liver nodule, peritoneal and omental disease.  She had endometrial and cervical biopsy consistent with moderate to poorly differentiated squamous cell carcinoma.  She missed appointment to see Dr. Berline Lopes on Friday.  She is here today with her roommate.  MEDICAL HISTORY:  Past Medical History:  Diagnosis Date   Anemia    Asthma    Bronchitis    COPD (chronic obstructive pulmonary disease) (HCC)    Depression    GERD (gastroesophageal reflux disease)    Headache    Hypertension     SURGICAL HISTORY: Past Surgical History:  Procedure Laterality Date   BREAST SURGERY     CESAREAN SECTION     LEG SURGERY      SOCIAL HISTORY: Social History   Socioeconomic History   Marital status: Married    Spouse name: Not on file   Number of children: Not on file   Years of education: Not on file   Highest education level: Not on file  Occupational History   Not on file  Tobacco Use   Smoking status: Every Day    Packs/day:  1.00    Years: 23.00    Total pack years: 23.00    Types: Cigarettes   Smokeless tobacco: Never  Vaping Use   Vaping Use: Never used  Substance and Sexual Activity   Alcohol use: Yes    Comment: Beer on occ   Drug use: Yes    Types: Marijuana   Sexual activity: Yes    Birth control/protection: None  Other Topics Concern   Not on file  Social History Narrative   Not on file   Social Determinants of Health   Financial Resource Strain: Low Risk  (09/09/2022)   Overall Financial Resource Strain (CARDIA)    Difficulty of Paying Living Expenses: Not hard at all  Food Insecurity: No Food Insecurity (09/22/2022)   Hunger Vital Sign    Worried About Running Out of Food in the Last Year: Never true    Ran Out of Food in the Last Year: Never true  Transportation Needs: No Transportation Needs (09/22/2022)   PRAPARE - Hydrologist (Medical): No    Lack of Transportation (Non-Medical): No  Recent Concern: Transportation Needs - Unmet Transportation Needs (09/09/2022)   PRAPARE - Transportation    Lack of Transportation (Medical): Yes    Lack of Transportation (Non-Medical): Yes  Physical Activity: Insufficiently Active (09/09/2022)   Exercise Vital Sign    Days of Exercise per Week: 2 days    Minutes of  Exercise per Session: 20 min  Stress: No Stress Concern Present (09/09/2022)   [place]    Feeling of Stress : Only a little  Social Connections: Socially Isolated (09/09/2022)   Social Connection and Isolation Panel [NHANES]    Frequency of Communication with Friends and Family: Three times a week    Frequency of Social Gatherings with Friends and Family: More than three times a week    Attends Religious Services: Never    Marine scientist or Organizations: No    Attends Archivist Meetings: Never    Marital Status: Separated  Intimate Partner Violence: Not At Risk (09/22/2022)    Humiliation, Afraid, Rape, and Kick questionnaire    Fear of Current or Ex-Partner: No    Emotionally Abused: No    Physically Abused: No    Sexually Abused: No    FAMILY HISTORY: Family History  Problem Relation Age of Onset   Dermatomyositis Mother    Hypertension Mother    Cancer Mother        lung   Cancer Father        lung   Heart disease Father    Colon cancer Neg Hx    Breast cancer Neg Hx    Ovarian cancer Neg Hx    Endometrial cancer Neg Hx    Pancreatic cancer Neg Hx    Prostate cancer Neg Hx     ALLERGIES:  is allergic to carrot [daucus carota], lisinopril-hydrochlorothiazide, ibuprofen, tramadol, and erythromycin.  MEDICATIONS:  Current Outpatient Medications  Medication Sig Dispense Refill   amLODipine (NORVASC) 10 MG tablet Take 1 tablet (10 mg total) by mouth daily. 30 tablet 2   HYDROcodone-acetaminophen (NORCO/VICODIN) 5-325 MG tablet Take 1 tablet by mouth every 12 (twelve) hours as needed for moderate pain. 30 tablet 0   No current facility-administered medications for this visit.    REVIEW OF SYSTEMS:   Constitutional: Denies fevers, chills or abnormal night sweats Eyes: Denies blurriness of vision, double vision or watery eyes Ears, nose, mouth, throat, and face: Denies mucositis or sore throat Respiratory: Positive for shortness of breath. Cardiovascular: Denies palpitation, chest discomfort or lower extremity swelling Gastrointestinal:  Denies heartburn or change in bowel habits.  Positive for nausea. Skin: Denies abnormal skin rashes Lymphatics: Denies new lymphadenopathy or easy bruising Neurological:Denies numbness, tingling or new weaknesses Behavioral/Psych: Mood is stable, no new changes  All other systems were reviewed with the patient and are negative.  PHYSICAL EXAMINATION: ECOG PERFORMANCE STATUS: 1 - Symptomatic but completely ambulatory  Vitals:   09/22/22 0822  BP: 136/84  Pulse: (!) 117  Resp: 19  Temp: (!) 97.5 F (36.4  C)  SpO2: 100%   Filed Weights   09/22/22 0822  Weight: 184 lb 1.6 oz (83.5 kg)    GENERAL:alert, no distress and comfortable SKIN: skin color, texture, turgor are normal, no rashes or significant lesions EYES: normal, conjunctiva are pink and non-injected, sclera clear OROPHARYNX:no exudate, no erythema and lips, buccal mucosa, and tongue normal  NECK: supple, thyroid normal size, non-tender, without nodularity LYMPH:  no palpable lymphadenopathy in the cervical, axillary or inguinal LUNGS: clear to auscultation and percussion with normal breathing effort HEART: regular rate & rhythm and no murmurs and no lower extremity edema ABDOMEN:abdomen soft, non-tender and normal bowel sounds Musculoskeletal:no cyanosis of digits and no clubbing  PSYCH: alert & oriented x 3 with fluent speech NEURO: no focal motor/sensory deficits  LABORATORY DATA:  I have reviewed the data as listed Lab Results  Component Value Date   WBC 11.0 (H) 09/22/2022   HGB 9.2 (L) 09/22/2022   HCT 29.0 (L) 09/22/2022   MCV 79.9 (L) 09/22/2022   PLT 381 09/22/2022     Chemistry      Component Value Date/Time   NA 132 (L) 09/22/2022 0956   K 4.5 09/22/2022 0956   CL 102 09/22/2022 0956   CO2 24 09/22/2022 0956   BUN 14 09/22/2022 0956   CREATININE 0.95 09/22/2022 0956   CREATININE 1.25 (H) 11/25/2016 1053      Component Value Date/Time   CALCIUM 8.4 (L) 09/22/2022 0956   ALKPHOS 68 09/22/2022 0956   AST 19 09/22/2022 0956   ALT 10 09/22/2022 0956   BILITOT 0.3 09/22/2022 0956       RADIOGRAPHIC STUDIES: I have personally reviewed the radiological images as listed and agreed with the findings in the report. CT ABDOMEN PELVIS W CONTRAST  Result Date: 08/30/2022 CLINICAL DATA:  Abdominal metastatic disease of unknown primary. * Tracking Code: BO * EXAM: CT ABDOMEN AND PELVIS WITH CONTRAST TECHNIQUE: Multidetector CT imaging of the abdomen and pelvis was performed using the standard protocol  following bolus administration of intravenous contrast. RADIATION DOSE REDUCTION: This exam was performed according to the departmental dose-optimization program which includes automated exposure control, adjustment of the mA and/or kV according to patient size and/or use of iterative reconstruction technique. CONTRAST:  139m OMNIPAQUE IOHEXOL 300 MG/ML  SOLN COMPARISON:  CT abdomen pelvis August 28, 2022. FINDINGS: Lower chest: Pulmonary nodules in the bilateral lung base measure up to 12 mm in the lingula on image 4/4 and in the right middle lobe on image 9/4. Hepatobiliary: Segment IV hepatic hypodensity near the falciform ligament measures 12 mm on image 23/2. Gallbladder is nondistended. No biliary ductal dilation. Pancreas: No pancreatic ductal dilation or evidence of acute inflammation. Spleen: No splenomegaly. Adrenals/Urinary Tract: Bilateral adrenal glands appear normal. No hydronephrosis. Mild prominence of the right ureter and collecting system to the level of the pelvic inlet where there is a partially loculated metastatic fluid collection. Kidneys demonstrate symmetric enhancement. Asymmetric wall thickening of the superior posterior urinary bladder wall measuring 11 mm in thickness with loss of fat plane between the urinary bladder in the lower uterine segment/cervix as seen on image 76/6. Stomach/Bowel: Stomach is distended with ingested material without focal wall thickening. Radiopaque ingested contrast material traverses distal loops of small bowel. Pathologic dilation of small or large bowel. Appendix is mildly prominent with some adjacent stranding measuring up to 8 mm in thickness, however this is favored reactive given the peritoneal carcinomatosis. Rectal wall thickening with loss of fat plane between the lower uterine segment/cervix and the rectum for instance on image 75/6. Additionally there is a loop of small bowel which traverses in close approximation with the lower uterine  segment/cervix with a possible fistulous connection as seen on image 77/6. Vascular/Lymphatic: Aortic atherosclerosis. Indexed lymph nodes are as follows: -left periaortic lymph node measures 14 mm in short axis on image 32/2. -aortocaval lymph node measures 11 mm in short axis on image 45/2. -left external iliac lymph node measures 13 mm in short axis on image 59/2 -right common iliac lymph node measures 13 mm in short axis on image 49/2 Reproductive: Heterogeneous irregularity of the endometrium extending through the cervix is highly suspicious for malignancy. Foci of gas in the cervix for instance on image 73/6 may reflect sequela of recent instrumentation or  fistula. Other: Multifocal omental and peritoneal metastasis within indexed left pelvic omental implant measuring 27 x 25 mm on image 68/2. Small volume abdominopelvic ascites with mesenteric edema. Stranding and nodularity in the anterior abdominal wall are favored sequela of subcutaneous injections. Musculoskeletal: No aggressive lytic or blastic lesion of bone. Remote left lower rib fracture. IMPRESSION: 1. Constellation of CT findings highly suspicious for locally advanced lower uterine/cervical neoplasm with possible involvement of the urinary bladder wall, rectum and a loop of small bowel. Foci of gas in the cervix which may reflect fistulous connection versus sequela of recent instrumentation. 2. Metastatic abdominopelvic lymph nodes with peritoneal carcinomatosis and a small volume abdominopelvic ascites. 3. Pulmonary nodules in the bilateral lung bases are concerning for metastatic disease involvement. 4. Segment IV hepatic hypodensity near the falciform ligament measuring 12 mm is nonspecific but suspicious for metastatic disease although this is a common location for focal fatty infiltration. Suggest more definitive characterization by nonemergent hepatic protocol MRI with and without contrast preferably as an outpatient upon resolution of  patient's current symptomatology when they are better able to follow commands including breath hold. 5. Mild prominence of the right ureter and collecting system to the level of the pelvic inlet where there is a partially loculated metastatic fluid collection, favored reactive prominence however given the degree of omental and peritoneal disease involvement of the ureter can not be excluded. 6.  Aortic Atherosclerosis (ICD10-I70.0). Electronically Signed   By: Dahlia Bailiff M.D.   On: 08/30/2022 12:56   CT ABDOMEN PELVIS WO CONTRAST  Result Date: 08/28/2022 CLINICAL DATA:  Abdominal pain. Diarrhea. COVID positive. Weakness. Nausea. Smoker with COPD. * Tracking Code: BO * EXAM: CT ABDOMEN AND PELVIS WITHOUT CONTRAST TECHNIQUE: Multidetector CT imaging of the abdomen and pelvis was performed following the standard protocol without IV contrast. RADIATION DOSE REDUCTION: This exam was performed according to the departmental dose-optimization program which includes automated exposure control, adjustment of the mA and/or kV according to patient size and/or use of iterative reconstruction technique. COMPARISON:  Chest CT 01/12/2018. Abdominopelvic CT from [facility] 04/03/2019, report only FINDINGS: Lower chest: Centrilobular emphysema. Bilateral subpleural predominant pulmonary nodules. A lingular 8 mm nodule on 01/04 is new since 01/12/2018. A right middle lobe 5 mm nodule on 08/04 is new since 01/12/2018 as is a 1.2 cm right middle lobe nodule on 14/4. Mild cardiomegaly, without pericardial or pleural effusion. Hepatobiliary: Moderate hepatic steatosis. Caudate and lateral segment left liver lobe enlargement. More focal hypoattenuation adjacent to falciform ligament is likely due to focal steatosis. Normal gallbladder, without biliary ductal dilatation. Pancreas: Normal, without mass or ductal dilatation. Spleen: Normal in size, without focal abnormality. Adrenals/Urinary Tract: Normal adrenal glands. No  renal calculi or hydronephrosis. No hydroureter or ureteric calculi. No bladder calculi. The bladder is thick walled with mild pericystic edema on 75/2. Stomach/Bowel: Normal stomach, without wall thickening. Fluid-filled colon suggests a diarrheal state. Normal terminal ileum. The appendix is mildly dilated including up to 1.0 cm on 49/5. Normal small bowel. Vascular/Lymphatic: Aortic atherosclerosis. Abdominal retroperitoneal adenopathy. 1.5 cm left para-aortic node on 30/2. Aortocaval nodes of up to 1.1 cm on 42/2. Left external iliac 1.0 cm node on 56/2. Right common iliac 1.3 cm node on 47/2. Reproductive: Equivocal soft tissue fullness within the uterine cervix on 71/2. No dominant adnexal mass, given limitation of noncontrast technique. Other: No significant free fluid.  No free intraperitoneal air. Multifocal omental/peritoneal metastasis with an index left pelvic omental implant measuring 1.6 x 2.4 cm  on 64/2. Musculoskeletal: Remote lower left rib fractures. IMPRESSION: 1. Constellation of findings which is most consistent with metastatic disease, including abdominopelvic adenopathy, peritoneal carcinomatosis, and new bibasilar pulmonary nodules. 2. No definite primary source identified given limitations of noncontrast stone study technique. There is equivocal soft tissue fullness within the uterine cervix and the appendix is mildly dilated without significant surrounding inflammation. These are both potential sites of primary which could be further evaluated via multidisciplinary oncology consultation for eventual contrast-enhanced chest abdomen and pelvic CT and/or PET. 3. Hepatic steatosis with probable cirrhosis. 4. Bladder wall thickening and pericystic edema, suggesting cystitis. 5. Fluid-filled colon suggests a diarrheal state. 6.  Aortic Atherosclerosis (ICD10-I70.0).  Emphysema (ICD10-J43.9). Electronically Signed   By: Abigail Miyamoto M.D.   On: 08/28/2022 13:51   DG Chest Portable 1  View  Result Date: 08/28/2022 CLINICAL DATA:  Cough EXAM: PORTABLE CHEST 1 VIEW COMPARISON:  08/19/2021 FINDINGS: Heart size and vascularity normal. Negative for heart failure. Negative for infiltrate or effusion Possible 1 cm nodule right lung base overlying the diaphragm, not seen on prior studies. IMPRESSION: Possible 1 cm nodule right lung base. Follow-up chest x-ray versus chest CT suggested. Electronically Signed   By: Franchot Gallo M.D.   On: 08/28/2022 11:36    ASSESSMENT:  1.  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 - CTAP (08/30/2022): Irregularity of the endometrium extending through the cervix with foci of gas in the cervix.  Multifocal omental and peritoneal metastasis.  Metastatic left periaortic, aortocaval, left external iliac and right common iliac lymph nodes.  Multiple bilateral lung nodules at bases.  12 mm hypodensity in the liver near the falciform ligament. - Endometrial biopsy (09/09/2022): Invasive moderately to poorly differentiated squamous cell carcinoma with abundant necrosis.  Cervix biopsy: Invasive and in situ moderate to poorly differentiated squamous cell carcinoma.  High risk HPV positive.  2.  Social/family history: - She is married and lives at home with her husband and roommate.  She has never worked.  She is current active smoker, half pack per day, for the last 28 years.  She occasionally smokes marijuana. - Mother died of lung cancer.  Father had head and neck cancer.  Maternal grandmother had cancer.  Paternal uncle died of cancer.  Maternal uncle also had cancer.  PLAN:  1.  Metastatic cervical cancer to the lungs, peritoneum and liver: - We discussed and reviewed scans and biopsy results in detail. - We talked about prognosis of metastatic cervical cancer and treatment intent in the  palliative setting. - We discussed platinum based chemotherapy with cisplatin and paclitaxel plus bevacizumab.  I have also recommended adding Atezolizumab irrespective of the PD-L1 status or pembrolizumab if PD-L1 is positive. - I have recommended tumor NGS testing. - I have also recommended a port placement. - I have encouraged her to keep the appointment with Dr. Berline Lopes.  2.  Normocytic anemia: - Will check ferritin and iron panel.  3.  Abdominal pain: - I have given refill for hydrocodone 5/325 every 12 hours as needed. - She was advised to take stool softeners.   Orders Placed This Encounter  Procedures   CT Chest W Contrast    Standing Status:   Future    Standing Expiration Date:   09/22/2023    Order Specific Question:   If indicated for the  ordered procedure, I authorize the administration of contrast media per Radiology protocol    Answer:   Yes    Order Specific Question:   Does the patient have a contrast media/X-ray dye allergy?    Answer:   No    Order Specific Question:   Is patient pregnant?    Answer:   No    Order Specific Question:   Preferred imaging location?    Answer:   [facility]    Order Specific Question:   Release to patient    Answer:   Immediate [1]   NM Bone Scan Whole Body    Standing Status:   Future    Standing Expiration Date:   09/22/2023    Order Specific Question:   If indicated for the ordered procedure, I authorize the administration of a radiopharmaceutical per Radiology protocol    Answer:   Yes    Order Specific Question:   Is the patient pregnant?    Answer:   No    Order Specific Question:   Preferred imaging location?    Answer:   [facility]    Order Specific Question:   Release to patient    Answer:   Immediate   IR IMAGING GUIDED PORT INSERTION    Standing Status:   Future    Standing Expiration Date:   09/23/2023    Order Specific Question:   Reason for Exam (SYMPTOM  OR DIAGNOSIS REQUIRED)    Answer:    chemotherapy administration    Order Specific Question:   Is the patient pregnant?    Answer:   No    Order Specific Question:   Preferred Imaging Location?    Answer:   [facility]    Order Specific Question:   Release to patient    Answer:   Immediate   CBC with Differential    Standing Status:   Future    Number of Occurrences:   1    Standing Expiration Date:   09/22/2023   Comprehensive metabolic panel    Standing Status:   Future    Number of Occurrences:   1    Standing Expiration Date:   09/22/2023   Ferritin    Standing Status:   Future    Number of Occurrences:   1    Standing Expiration Date:   09/22/2023   Iron and TIBC ([place] [facility] ONLY)    Standing Status:   Future    Number of Occurrences:   1    Standing Expiration Date:   09/23/2023   HIV antibody (with reflex)    Standing Status:   Future    Number of Occurrences:   1    Standing Expiration Date:   09/23/2023   Ambulatory referral to Social Work    Referral Priority:   Routine    Referral Type:   Consultation    Referral Reason:   Specialty Services Required    Number of Visits Requested:   1   Ambulatory Referral to [facility]    Referral Priority:   Routine    Referral Type:   Consultation    Referral Reason:   Specialty Services Required    Number of Visits Requested:   1    All questions were answered. The patient knows to call the clinic with any problems, questions or concerns.      Derek Jack, MD 09/22/2022 5:17 PM

## 2022-09-22 NOTE — Progress Notes (Signed)
[place] testing requested on [id number] per Dr. Delton Coombes request.

## 2022-09-23 MED ORDER — HYDROCODONE-ACETAMINOPHEN 5-325 MG PO TABS: 1.0000 | ORAL_TABLET | Freq: Two times a day (BID) | ORAL | 0 refills | Status: DC | PRN

## 2022-09-25 ENCOUNTER — Other Ambulatory Visit: Payer: Self-pay | Admitting: Radiology

## 2022-09-25 ENCOUNTER — Other Ambulatory Visit: Payer: Self-pay | Admitting: Hematology

## 2022-09-25 ENCOUNTER — Encounter: Payer: Self-pay | Admitting: Hematology

## 2022-09-25 ENCOUNTER — Inpatient Hospital Stay: Payer: Medicaid Other

## 2022-09-25 DIAGNOSIS — Z95828 Presence of other vascular implants and grafts: Secondary | ICD-10-CM

## 2022-09-25 DIAGNOSIS — C538 Malignant neoplasm of overlapping sites of cervix uteri: Secondary | ICD-10-CM

## 2022-09-25 HISTORY — DX: Presence of other vascular implants and grafts: Z95.828

## 2022-09-25 MED ORDER — PROCHLORPERAZINE MALEATE 10 MG PO TABS: 10.0000 mg | ORAL_TABLET | Freq: Four times a day (QID) | ORAL | 1 refills | Status: DC | PRN

## 2022-09-25 MED ORDER — LIDOCAINE-PRILOCAINE 2.5-2.5 % EX CREA: TOPICAL_CREAM | CUTANEOUS | 3 refills | Status: DC

## 2022-09-25 MED ORDER — ONDANSETRON HCL 8 MG PO TABS: 8.0000 mg | ORAL_TABLET | Freq: Three times a day (TID) | ORAL | 1 refills | Status: DC | PRN

## 2022-09-25 MED ORDER — DEXAMETHASONE 4 MG PO TABS: ORAL_TABLET | ORAL | 1 refills | Status: DC

## 2022-09-25 NOTE — Patient Instructions (Addendum)
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 We will plan to treat you in the clinic every 3 weeks with a combination of two chemotherapy drugs and two immunotherapy drugs. The chemotherapy drugs are called cisplatin and Taxol. The immunotherapy drugs are called Avastin and Tecentriq. The intent of treatment is to shrink and control this cancer, prevent it from spreading further, and to alleviate any symptoms you may be having related to this disease. You will see the doctor regularly throughout treatment. We will obtain blood work from you prior to every treatment and monitor your results to make sure it is safe to give your treatment. The doctor monitors your response to treatment by the way you are feeling, your blood work, and by obtaining scans periodically.  There will be wait times while you are here for treatment. It will take about 30 minutes to 1 hour for your lab work to result. Then there will be wait times while pharmacy mixes your medications.     Medications you will receive in the clinic prior to your chemotherapy medications:  Aloxi:  ALOXI is used in adults to help prevent nausea and vomiting that happens with certain chemotherapy drugs.  Aloxi is a long acting medication, and will remain in your system for about two days.   Emend:  This is an anti-nausea medication that is used with Aloxi to help prevent nausea and vomiting caused by chemotherapy.  Dexamethasone:  This is a steroid given prior to chemotherapy to help prevent allergic reactions; it may also help prevent and control nausea and diarrhea.     Cisplatin   About This Drug   Cisplatin is a drug used to treat cancer. This drug is given in the vein (IV).  This will take 1 hour to infuse.  With this drug you will receive 2 hours of IV fluid hydration prior to administration, and 1 hour of IV hydration after  administration.  This is to help protect your kidneys.  You will have to urinate 200 mL prior to receiving this medication.  We will give you something to measure your urine in.  Possible Side Effects   Bone marrow suppression. This is a decrease in the number of white blood cells, red blood cells, and platelets. This may raise your risk of infection, make you tired and weak, and raise your risk of bleeding.    Decreased hearing, ringing in the ear    Nausea and vomiting (throwing up)    Changes in your kidney function    Effects on the nerves called peripheral neuropathy. You may feel numbness, tingling, or pain in your hands and feet. It may be hard for you to button your clothes, open jars, or walk as usual. The effect on the nerves may get worse with more doses of the drug. These effects get better in some people after the drug is stopped but it does not get better in all people.    Hair loss. Hair loss is often temporary, although with certain medicine, hair loss can sometimes be permanent. Hair loss may happen suddenly or gradually. If you lose hair, you may lose it from your head, face, armpits, pubic area, chest, and/or legs. You may also notice your hair getting thin.   Note: Not all possible side effects are included above.   Warnings and Precautions   Hearing loss in one or both ears may happen. While  very rare, it is not known if these effects are reversible.    Blurred vision or other changes in eyesight    Severe changes in your kidney function, which can cause kidney failure.    Severe bone marrow suppression and neutropenic fever, a type of fever that can develop when you have a very low number of white blood cells which can be life-threatening.    Severe nausea and vomiting if medications to prevent these symptoms are not taken    Severe effects on the nerves    Allergic reactions, including anaphylaxis are rare but may happen in some patients and can be  life-threatening Signs of allergic reaction to this drug may be swelling of the face, feeling like your tongue or throat are swelling, trouble breathing, rash, itching, fever, chills, feeling dizzy, and/or feeling that your heart is beating in a fast or not normal way. If this happens, do not take another dose of this drug. You should get urgent medical treatment.    This drug may raise your risk of getting a second cancer, such as acute leukemia.    Skin and tissue irritation including redness, pain, warmth, or swelling at the IV site if the drug leaks out of the vein and into nearby tissue. Very rarely it may cause local tissue necrosis (tissue death). Note: Some of the side effects above are very rare. If you have concerns and/or questions, please discuss them with your medical team. Important Information    This drug may be present in the saliva, tears, sweat, urine, stool, vomit, semen, and vaginal secretions. Talk to your doctor and/or your nurse about the necessary precautions to take during this time.   Treating Side Effects    Manage tiredness by pacing your activities for the day.    Be sure to include periods of rest between energy-draining activities.  To decrease the risk of infection, wash your hands regularly.    Avoid close contact with people who have a cold, the flu, or other infections.    Take your temperature as your doctor or nurse tells you, and whenever you feel like you may have a fever.    To help decrease the risk of bleeding, use a soft toothbrush. Check with your nurse before using dental floss.    Be very careful when using knives or tools.    Use an electric shaver instead of a razor.    Drink plenty of fluids (a minimum of eight glasses per day is recommended).    If you throw up, you should drink more fluids so that you do not become dehydrated (lack of water in the body from losing too much fluid).    To help with nausea and vomiting, eat small, frequent  meals instead of three large meals a day. Choose foods and drinks that are at room temperature. Ask your nurse or doctor about other helpful tips and medicine that is available to help stop or lessen these symptoms.    If you have numbness and tingling in your hands and feet, be careful when cooking, walking, and handling sharp objects and hot liquids.    To help with hair loss, wash with a mild shampoo and avoid washing your hair every day. Avoid coloring your hair.    Avoid rubbing your scalp, pat your hair or scalp dry.    Limit your use of hair spray, electric curlers, blow dryers, and curling irons.    If you are interested in getting a  wig, talk to your nurse and they can help you get in touch with programs in your local area.    Food and Drug Interactions   There are no known interactions of cisplatin with food.    This drug may interact with other medicines. Tell your doctor and pharmacist about all the prescription and over-the-counter medicines and dietary supplements (vitamins, minerals, herbs, and others) that you are taking at this time. Also, check with your doctor or pharmacist before starting any new prescription or over-the-counter medicines, or dietary supplements to make sure that there are no interactions.   When to Call the Doctor   Call your doctor or nurse if you have any of these symptoms and/or any new or unusual symptoms:    Fever of 100.4 F (38 C) or higher    Chills    Blurred vision or other changes in eyesight    Tiredness that interferes with your daily activities  Feeling dizzy or lightheaded    Easy bleeding or bruising    Decreased or loss of hearing    Ringing in the ear    Nausea that stops you from eating or drinking and/or is not relieved by prescribed medicines    Throwing up more than 3 times a day    Decreased or very dark urine    Numbness, tingling, or pain in your hands and feet    While you are getting this drug, please tell  your nurse right away if you have any pain, redness, or swelling at the site of the IV infusion.    Signs of allergic reaction: swelling of the face, feeling like your tongue or throat are swelling, trouble breathing, rash, itching, fever, chills, feeling dizzy, and/or feeling that your heart is beating in a fast or not normal way. If this happens, call 911 for emergency care.    If you think you may be pregnant or may have impregnated your partner   Reproduction Warnings    Pregnancy warning: This drug can have harmful effects on the unborn baby. Women of childbearing potential should use effective methods of birth control during your cancer treatment and for at 14 months after stopping treatment. Men with female partners of childbearing potential should use effective methods of birth control during your cancer treatment and for 11 months after stopping treatment. Let your doctor know right away if you think you may be pregnant or may have impregnated your partner.    Breastfeeding warning: Women should not breastfeed during treatment because this drug could enter the breast milk and cause harm to a breastfeeding baby.     Fertility warning: In men and women both, this drug may affect your ability to have children in the future. Talk with your doctor or nurse if you plan to have children. Ask for information on sperm or egg banking.    Paclitaxel (Taxol)  About This Drug Paclitaxel is a drug used to treat cancer. It is given in the vein (IV).  This will take 3 hours to infuse.  This first infusion will take longer to infuse because it is increased slowly to monitor for reactions.  The nurse will be in the room with you for the first 15 minutes of the first infusion.  Possible Side Effects   Hair loss. Hair loss is often temporary, although with certain medicine, hair loss can sometimes be permanent. Hair loss may happen suddenly or gradually. If you lose hair, you may lose it from your head,  face, armpits, pubic area, chest, and/or legs. You may also notice your hair getting thin.   Swelling of your legs, ankles and/or feet (edema)   Flushing   Nausea and throwing up (vomiting)   Loose bowel movements (diarrhea)   Bone marrow depression. This is a decrease in the number of white blood cells, red blood cells, and platelets. This may raise your risk of infection, make you tired and weak (fatigue), and raise your risk of bleeding.   Effects on the nerves are called peripheral neuropathy. You may feel numbness, tingling, or pain in your hands and feet. It may be hard for you to button your clothes, open jars, or walk as usual. The effect on the nerves may get worse with more doses of the drug. These effects get better in some people after the drug is stopped but it does not get better in all people.   Changes in your liver function   Bone, joint and muscle pain   Abnormal EKG   Allergic reaction: Allergic reactions, including anaphylaxis are rare but may happen in some patients. Signs of allergic reaction to this drug may be swelling of the face, feeling like your tongue or throat are swelling, trouble breathing, rash, itching, fever, chills, feeling dizzy, and/or feeling that your heart is beating in a fast or not normal way. If this happens, do not take another dose of this drug. You should get urgent medical treatment.   Infection   Changes in your kidney function.  Note: Each of the side effects above was reported in 20% or greater of patients treated with paclitaxel. Not all possible side effects are included above.  Warnings and Precautions   Severe allergic reactions   Severe bone marrow depression  Treating Side Effects   To help with hair loss, wash with a mild shampoo and avoid washing your hair every day.   Avoid rubbing your scalp, instead, pat your hair or scalp dry   Avoid coloring your hair   Limit your use of hair spray, electric curlers, blow  dryers, and curling irons.   If you are interested in getting a wig, talk to your nurse. You can also call the [facility] at 800-ACS-2345 to find out information about the "Look Good, Feel Better" program close to where you live. It is a free program where women getting chemotherapy can learn about wigs, turbans and scarves as well as makeup techniques and skin and nail care.   Ask your doctor or nurse about medicines that are available to help stop or lessen diarrhea and/or nausea.   To help with nausea and vomiting, eat small, frequent meals instead of three large meals a day. Choose foods and drinks that are at room temperature. Ask your nurse or doctor about other helpful tips and medicine that is available to help or stop lessen these symptoms.   If you get diarrhea, eat low-fiber foods that are high in protein and calories and avoid foods that can irritate your digestive tracts or lead to cramping. Ask your nurse or doctor about medicine that can lessen or stop your diarrhea.   Mouth care is very important. Your mouth care should consist of routine, gentle cleaning of your teeth or dentures and rinsing your mouth with a mixture of 1/2 teaspoon of salt in 8 ounces of water or  teaspoon of baking soda in 8 ounces of water. This should be done at least after each meal and at bedtime.   If  you have mouth sores, avoid mouthwash that has alcohol. Also avoid alcohol and smoking because they can bother your mouth and throat.   Drink plenty of fluids (a minimum of eight glasses per day is recommended).   Take your temperature as your doctor or nurse tells you, and whenever you feel like you may have a fever.   Talk to your doctor or nurse about precautions you can take to avoid infections and bleeding.   Be careful when cooking, walking, and handling sharp objects and hot liquids.  Food and Drug Interactions   There are no known interactions of paclitaxel with food.   This drug  may interact with other medicines. Tell your doctor and pharmacist about all the medicines and dietary supplements (vitamins, minerals, herbs and others) that you are taking at this time.   The safety and use of dietary supplements and alternative diets are often not known. Using these might affect your cancer or interfere with your treatment. Until more is known, you should not use dietary supplements or alternative diets without your cancer doctor's help.  When to Call the Doctor  Call your doctor or nurse if you have any of the following symptoms and/or any new or unusual symptoms:   Fever of 100.5 F (38 C) or above   Chills   Redness, pain, warmth, or swelling at the IV site during the infusion   Signs of allergic reaction: swelling of the face, feeling like your tongue or throat are swelling, trouble breathing, rash, itching, fever, chills, feeling dizzy, and/or feeling that your heart is beating in a fast or not normal way   Feeling that your heart is beating in a fast or not normal way (palpitations)   Weight gain of 5 Carson in one week (fluid retention)   Decreased urine or very dark urine   Signs of liver problems: dark urine, pale bowel movements, bad stomach pain, feeling very tired and weak, unusual  itching, or yellowing of the eyes or skin   Heavy menstrual period that lasts longer than normal   Easy bruising or bleeding   Nausea that stops you from eating or drinking, and/or that is not relieved by prescribed medicines.   Loose bowel movements (diarrhea) more than 4 times a day or diarrhea with weakness or lightheadedness   Pain in your mouth or throat that makes it hard to eat or drink   Lasting loss of appetite or rapid weight loss of five Carson in a week   Signs of peripheral neuropathy: numbness, tingling, or decreased feeling in fingers or toes; trouble walking or changes in the way you walk; or feeling clumsy when buttoning clothes, opening jars, or other  routine activities   Joint and muscle pain that is not relieved by prescribed medicines   Extreme fatigue that interferes with normal activities   While you are getting this drug, please tell your nurse right away if you have any pain, redness, or swelling at the site of the IV infusion.   If you think you are pregnant.  Reproduction Warnings   Pregnancy warning: This drug may have harmful effects on the unborn child, it is recommended that effective methods of birth control should be used during your cancer treatment. Let your doctor know right away if you think you may be pregnant.   Breast feeding warning: Women should not breast feed during treatment because this drug could enter the breastmilk and cause harm to a breast feeding baby.   Kristin Carson  Kristin Carson will see the doctor regularly throughout treatment.  We will obtain blood work from you prior to every treatment and monitor your results to make sure it is safe to give your treatment. The doctor monitors your response to treatment by the way you are feeling, your blood work, and by obtaining scans periodically.  There will be wait times while you are here for treatment.  It will take about 30 minutes to 1 hour for your lab work to result.  Then there will be wait times while pharmacy mixes your medications.    Bevacizumab (Avastin)  About This Drug Bevacizumab is used to treat cancer. It is given in the vein (IV). It will take 30 minutes to infuse.   Possible Side Effects  Teary eyes   Runny/stuffy nose   Nosebleed   Changes in the way food and drinks taste   Headache   Back pain   Protein in your urine   Bleeding in your rectum   Dry skin   A red skin rash which can be peeling or scaling   High blood pressure  Note: Each of the side effects above was reported in 10% or greater of patients treated with bevacizumab-xxxx. Not all possible side effects are included  above.  Warnings and Precautions   Perforation or fistula- an abnormal hole in your stomach, intestine, esophagus, or other organ, which can be life-threatening  Slow wound healing, which can be life-threatening  Abnormal bleeding which can be life-threatening - symptoms may be coughing up blood, throwing up blood (may look like coffee grounds), red or black tarry bowel movements, abnormally heavy menstrual flow, nosebleeds or any other unusual bleeding.  Blood clots and events such as stroke and heart attack. A blood clot in your leg may cause your leg to swell, appear red and warm, and/or cause pain. A blood clot in your lungs may cause trouble breathing, pain when breathing, and/or chest pain.  Severe high blood pressure  Changes in your central nervous system can happen. The central nervous system is made up of your brain and spinal cord. You could feel extreme tiredness, agitation, confusion, hallucinations   This drug may interact with other medicines. Tell your doctor and pharmacist about all the medicines and dietary supplements (vitamins, minerals, herbs and others) that you are taking at this time. Also, check with your doctor or pharmacist before starting any new prescription or over-the-counter medicines, or dietary supplements to make sure that there are no interactions.  When to Call the Doctor Call your doctor or nurse if you have any of these symptoms and/or any new or unusual symptoms:   Fever of 100.4 F (38 C) or higher   Chills   Confusion and/or agitation   Hallucinations   Trouble understanding or speaking   Headache that does not go away   Nose bleed that doesn't stop bleeding after 10 -15 minutes   Feeling dizzy or lightheaded   Blurry vision or changes in your eyesight   Difficulty swallowing   Easy bleeding or bruising   Blood in your urine, vomit (bright red or coffee-ground) and/or stools ( bright red, or black/tarry)   Coughing up blood    Wheezing and/or trouble breathing   Chest pain or symptoms of a heart attack. Most heart attacks involve pain in the center of the chest that lasts more than a few minutes. The pain may go away and come back. It can feel like  pressure, squeezing, fullness, or pain. Sometimes pain is felt in one or both arms, the back, neck, jaw, or stomach. If any of these symptoms last 2 minutes, call 911.   Symptoms of a stroke such as sudden numbness or weakness of your face, arm, or leg, mostly on one side of your body; sudden confusion, trouble speaking or understanding; sudden trouble seeing in one or both eyes; sudden trouble walking, feeling dizzy, loss of balance or coordination; or sudden, bad headache with no known cause. If you have any of these symptoms for 2 minutes, call 911.   Numbness or lack of strength to your arms, legs, face, or body   Nausea that stops you from eating or drinking and/or relieved by prescribed medicine   Throwing up   Pain in your abdomen that does not go away   Foamy or bubbly-looking urine   Signs of infusion reaction: fever or shaking chills, flushing, facial swelling, feeling dizzy, headache, trouble breathing, rash, itching, chest tightness, or chest pain.   Pain that does not go away or is not relieved by prescribed medicine   Your leg or arm is swollen, red, warm and/or painful   Swelling of arms, hands, legs and/or feet   Weight gain of 5 Carson in one week (fluid retention)   If you think you may be pregnant  Reproduction Warnings  Pregnancy warning: This drug can have harmful effects on the unborn baby. Women of child bearing potential should use effective methods of birth control during your cancer treatment and for 6 months after treatment. In women, changes in your ovaries may happen that may cause menstrual bleeding to become irregular or stop, do not assume you cannot get pregnant. Let your doctor know right away if you think you may be pregnant    Breastfeeding warning: Women should not breastfeed during treatment and for 6 months after treatment because this drug could enter the breast milk and cause harm to a breastfeeding baby.    Fertility warning: In women, this drug may affect your ability to have children in the future. Talk with your doctor or nurse if you plan to have children. Ask for information on egg banking.   Atezolizumab Kristin Carson)  About This Drug Kristin Carson is used to treat cancer. It is given by the vein (IV). The first infusion takes one hour. All subsequent infusions will be given over 30 minutes.   Possible Side Effects  Nausea  Tiredness and weakness  Decreased appetite (decreased hunger)  Cough  Trouble breathing  Note: Each of the side effects above was reported in 20% or greater of patients treated with atezolizumab. Your side effects may be different if you are taking atezolizumab in combination with another agent. Not all possible side effects are included above.  Warnings and Precautions  This drug works with your immune system and can cause inflammation (swelling) in any of your organs and tissues and can change how they work. This may put you at risk for developing serious medical problems, which can be life-threatening.   Inflammation of the lungs which can be life-threatening. You may have a dry cough or trouble breathing.   Severe changes in your liver function which can cause liver failure and be life-threatening.   Colitis which is swelling in the colon. The symptoms are diarrhea, stomach cramping, and sometimes blood in the bowel movements.   Changes in your central nervous system can happen. The central nervous system is made up of your brain and spinal  cord. You could feel extreme tiredness, agitation, confusion, hallucinations (see or hear things that are not there), trouble understanding or speaking, loss of control of your bowels or bladder, eyesight changes, numbness or lack of  strength to your arms, legs, face, or body, and coma. If you start to have any of these symptoms let your doctor know right away.   This drug may affect your hormone glands (thyroid, adrenals, pituitary and pancreas).   Blood sugar levels may change, and you may develop diabetes. If you already have diabetes, changes may need to be made to your diabetes medication.   Severe infections, including viral, bacterial and fungal, which can be life-threatening   While you are getting this drug in your vein (IV), you may have a reaction to the drug. Sometimes you may be given medication to stop or lessen these side effects. Your nurse will check you closely for these signs: fever or shaking chills, flushing, facial swelling, feeling dizzy, headache, trouble breathing, rash, itching, chest tightness, or chest pain. These reactions may happen after your infusion. If this happens, call 911 for emergency care.  Note: Some of the side effects above are very rare. If you have concerns and/or questions, please discuss them with your medical team.  Important Information  This drug may be present in the saliva, tears, sweat, urine, stool, vomit, semen, and vaginal secretions. Talk to your doctor and/or your nurse about the necessary precautions to take during this time.   Treating Side Effects   Manage tiredness by pacing your activities for the day.   Be sure to include periods of rest between energy-draining activities.   To decrease the risk of infection, wash your hands regularly.   Avoid close contact with people who have a cold, the flu, or other infections.   Take your temperature as your doctor or nurse tells you, and whenever you feel like you may have a fever.   Drink plenty of fluids (a minimum of eight glasses per day is recommended).   Ask your doctor or nurse about medicine that is available to help stop or lessen loose bowel movements.   To help with nausea, eat small, frequent meals  instead of three large meals a day. Choose foods and drinks that are at room temperature. Ask your nurse or doctor about other helpful tips and medicine that is available to help stop or lessen these symptoms.  To help with decreased appetite, eat small, frequent meals. Eat foods high in calories and protein, such as meat, poultry, fish, dry beans, tofu, eggs, nuts, milk, yogurt, cheese, ice cream, pudding, and nutritional supplements.   Consider using sauces and spices to increase taste. Daily exercise, with your doctor's approval, may increase your appetite.   If you have diabetes, keep good control of your blood sugar level. Tell your nurse or your doctor if your glucose levels are higher or lower than normal.   Keeping your pain under control is important to your well-being. Please tell your doctor or nurse if you are experiencing pain.   If you get a rash do not put anything on it unless your doctor or nurse says you may. Keep the area around the rash clean and dry. Ask your doctor for medicine if your rash bothers you.   If you have numbness and tingling in your hands and feet, be careful when cooking, walking, and handling sharp objects and hot liquids.   Infusion reactions may happen after your infusion. If this happens, call  911 for emergency care.  Food and Drug Interactions  There are no known interactions of atezolizumab with food.   This drug may interact with other medicines. Tell your doctor and pharmacist about all the prescription and over-the-counter medicines and dietary supplements (vitamins, minerals, herbs and others) that you are taking at this time. Also, check with your doctor or pharmacist before starting any new prescription or over-the-counter medicines, or dietary supplements to make sure that there are no interactions.  When to Call the Doctor  Call your doctor or nurse if you have any of these symptoms and/or any new or unusual symptoms:   Fever of 100.4 F (38  C) or higher   Chills   Tiredness that interferes with your daily activities   Feeling dizzy or lightheaded   Pain in your chest   Dry cough   Coughing up yellow, green, or bloody mucus   Wheezing or trouble breathing   Feeling that your heart is beating in a fast or not normal way (palpitations)   Confusion and/or agitation   Hallucinations   Trouble understanding or speaking   Numbness or lack of strength to your arms, legs, face, or body   Blurred vision or other changes in eyesight   Diarrhea, 4 times in one day or diarrhea with lack of strength or a feeling of being dizzy   Pain in your abdomen that does not go away   Blood in your stool   Nausea that stops you from eating or drinking and/or is not relieved by prescribed medicines   Throwing up   Lasting loss of appetite or rapid weight loss of five [place] in a week   Abnormal blood sugar   Unusual thirst, passing urine often, headache, sweating, shakiness, irritability   Pain that does not go away, or is not relieved by prescribed medicines   Numbness, tingling, or pain your hands and feet   Extreme weakness that interferes with normal activities   A new rash or a rash that is not relieved by prescribed medicines   Signs of infusion reaction: fever or shaking chills, flushing, facial swelling, feeling dizzy, headache, trouble breathing, rash, itching, chest tightness, or chest pain. If this happens, call 911 for emergency care.   Signs of possible liver problems: dark urine, pale bowel movements, bad stomach pain, feeling very tired and weak, unusual itching, or yellowing of the eyes or skin   If you think you may be pregnant  Reproduction Warnings   Pregnancy warning: This drug can have harmful effects on the unborn baby. Women of childbearing potential should use effective methods of birth control during your cancer treatment and for at least 5 months after treatment. Let your doctor know right away  if you think you may be pregnant.   Breastfeeding warning: It is not known if this drug passes into breast milk. For this reason, Women should not breastfeed during treatment and for at least 5 months after treatment because this drug could enter the breast milk and cause harm to a breastfeeding baby.   Fertility warning: In women, this drug may affect your ability to have children in the future. Talk with your doctor or nurse if you plan to have children. Ask for information on egg banking.   SELF CARE ACTIVITIES WHILE ON CHEMOTHERAPY/IMMUNOTHERAPY:  Hydration Increase your fluid intake and drink at least 8 to 12 cups (64 ounces) of water/decaffeinated beverages per day after treatment. You can still have your cup of coffee or  soda but these beverages do not count as part of your 8 to 12 cups that you need to drink daily. No alcohol intake.  Medications Continue taking your normal prescription medication as prescribed.  If you start any new herbal or new supplements please let us know first to make sure it is safe.  Mouth Care Have teeth cleaned professionally before starting treatment. Keep dentures and partial plates clean. Use soft toothbrush and do not use mouthwashes that contain alcohol. Biotene is a good mouthwash that is available at most pharmacies or may be ordered by calling [phone]. Use warm salt water gargles (1 teaspoon salt per 1 quart warm water) before and after meals and at bedtime. Or you may rinse with 2 tablespoons of three-percent hydrogen peroxide mixed in eight ounces of water. If you are still having problems with your mouth or sores in your mouth please call the clinic. If you need dental work, please let the doctor know before you go for your appointment so that we can coordinate the best possible time for you in regards to your chemo regimen. You need to also let your dentist know that you are actively taking chemo. We may need to do labs prior to your dental  appointment.  Skin Care Always use sunscreen that has not expired and with SPF (Sun Protection Factor) of 50 or higher. Wear hats to protect your head from the sun. Remember to use sunscreen on your hands, ears, face, & feet.  Use good moisturizing lotions such as udder cream, eucerin, or even Vaseline. Some chemotherapies can cause dry skin, color changes in your skin and nails.    Avoid long, hot showers or baths. Use gentle, fragrance-free soaps and laundry detergent. Use moisturizers, preferably creams or ointments rather than lotions because the thicker consistency is better at preventing skin dehydration. Apply the cream or ointment within 15 minutes of showering. Reapply moisturizer at night, and moisturize your hands every time after you wash them.   Infection Prevention Please wash your hands for at least 30 seconds using warm soapy water. Handwashing is the #1 way to prevent the spread of germs. Stay away from sick people or people who are getting over a cold. If you develop respiratory systems such as green/yellow mucus production or productive cough or persistent cough let us know and we will see if you need an antibiotic. It is a good idea to keep a pair of gloves on when going into grocery stores/[employer] to decrease your risk of coming into contact with germs on the carts, etc. Carry alcohol hand gel with you at all times and use it frequently if out in public. If your temperature reaches 100.5 or higher please call the clinic and let us know.  If it is after hours or on the weekend please go to the ER if your temperature is over 100.4.  Please have your own personal thermometer at home to use.    Sex and bodily fluids If you are going to have sex, a condom must be used to protect the person that isn't taking immunotherapy. For a few days after treatment, immunotherapy can be excreted through your bodily fluids.  When using the toilet please close the lid and flush the toilet twice.  Do  this for a few day after you have had immunotherapy.   Contraception It is not known for sure whether or not immunotherapy drugs can be passed on through semen or secretions from the vagina. Because of this  some doctors advise people to use a barrier method if you have sex during treatment. This applies to vaginal, anal or oral sex.  Generally, doctors advise a barrier method only for the time you are actually having the treatment and for about a week after your treatment.  Advice like this can be worrying, but this does not mean that you have to avoid being intimate with your partner. You can still have close contact with your partner and continue to enjoy sex.  Animals If you have cats or birds we just ask that you not change the litter or change the cage.  Please have someone else do this for you while you are on immunotherapy.   Food Safety During and After Cancer Treatment Food safety is important for people both during and after cancer treatment. Cancer and cancer treatments, such as chemotherapy, radiation therapy, and stem cell/bone marrow transplantation, often weaken the immune system. This makes it harder for your body to protect itself from foodborne illness, also called food poisoning. Foodborne illness is caused by eating food that contains harmful bacteria, parasites, or viruses.  Foods to avoid Some foods have a higher risk of becoming tainted with bacteria. These include: Unwashed fresh fruit and vegetables, especially leafy vegetables that can hide dirt and other contaminants Raw sprouts, such as alfalfa sprouts Raw or undercooked beef, especially ground beef, or other raw or undercooked meat and poultry Fatty, fried, or spicy foods immediately before or after treatment.  These can sit heavy on your stomach and make you feel nauseous. Raw or undercooked shellfish, such as oysters. Sushi and sashimi, which often contain raw fish.  Unpasteurized beverages, such as unpasteurized  fruit juices, raw milk, raw yogurt, or cider Undercooked eggs, such as soft boiled, over easy, and poached; raw, unpasteurized eggs; or foods made with raw egg, such as homemade raw cookie dough and homemade mayonnaise  Simple steps for food safety  Shop smart. Do not buy food stored or displayed in an unclean area. Do not buy bruised or damaged fruits or vegetables. Do not buy cans that have cracks, dents, or bulges. Pick up foods that can spoil at the end of your shopping trip and store them in a cooler on the way home.  Prepare and clean up foods carefully. Rinse all fresh fruits and vegetables under running water, and dry them with a clean towel or paper towel. Clean the top of cans before opening them. After preparing food, wash your hands for 20 seconds with hot water and soap. Pay special attention to areas between fingers and under nails. Clean your utensils and dishes with hot water and soap. Disinfect your kitchen and cutting boards using 1 teaspoon of liquid, unscented bleach mixed into 1 quart of water.    Dispose of old food. Eat canned and packaged food before its expiration date (the "use by" or "best before" date). Consume refrigerated leftovers within 3 to 4 days. After that time, throw out the food. Even if the food does not smell or look spoiled, it still may be unsafe. Some bacteria, such as Listeria, can grow even on foods stored in the refrigerator if they are kept for too long.  Take precautions when eating out. At restaurants, avoid buffets and salad bars where food sits out for a long time and comes in contact with many people. Food can become contaminated when someone with a virus, often a norovirus, or another "bug" handles it. Put any leftover food in a "to-go" container  yourself, rather than having the server do it. And, refrigerate leftovers as soon as you get home. Choose restaurants that are clean and that are willing to prepare your food as you order it  cooked.    SYMPTOMS TO REPORT AS SOON AS POSSIBLE AFTER TREATMENT:  FEVER GREATER THAN 100.4 F CHILLS WITH OR WITHOUT FEVER NAUSEA AND VOMITING THAT IS NOT CONTROLLED WITH YOUR NAUSEA MEDICATION UNUSUAL SHORTNESS OF BREATH UNUSUAL BRUISING OR BLEEDING TENDERNESS IN MOUTH AND THROAT WITH OR WITHOUT PRESENCE OF ULCERS URINARY PROBLEMS BOWEL PROBLEMS UNUSUAL RASH     Wear comfortable clothing and clothing appropriate for easy access to any Portacath or PICC line. Let us know if there is anything that we can do to make your therapy better!   What to do if you need assistance after hours or on the weekends: CALL [phone].  HOLD on the line, do not hang up.  You will hear multiple messages but at the end you will be connected with a nurse triage line.  They will contact the doctor if necessary.  Most of the time they will be able to assist you.  Do not call the hospital operator.    I have been informed and understand all of the instructions given to me and have received a copy. I have been instructed to call the clinic [phone] or my family physician as soon as possible for continued medical care, if indicated. I do not have any more questions at this time but understand that I may call the [facility] or the Patient Navigator at [phone] during office hours should I have questions or need assistance in obtaining follow-up care.

## 2022-09-25 NOTE — Progress Notes (Signed)
START OFF PATHWAY REGIMEN - Other   [id number]:Bevacizumab 15 mg/kg IV D1 + Cisplatin 50 mg/m2 IV D1 + Paclitaxel 175 mg/m2 IV D1 + Pembrolizumab 200 mg IV D1 q21 Days:   A cycle is every 21 days:     Bevacizumab-xxxx      Pembrolizumab      Paclitaxel      Cisplatin   **Always confirm dose/schedule in your pharmacy ordering system**  Patient Characteristics: Intent of Therapy: Non-Curative / Palliative Intent, Discussed with Patient

## 2022-09-25 NOTE — Progress Notes (Signed)
Chemotherapy/immunotherapy education packet given and discussed with pt and family in detail.  Discussed diagnosis and staging, tx regimen, and intent of tx.  Reviewed chemotherapy/immunotherapy medications and side effects, as well as pre-medications.  Instructed on how to manage side effects at home, and when to call the clinic.  Importance of fever/chills discussed with pt and family. Discussed precautions to implement at home after receiving tx, as well as self care strategies. Phone numbers provided for clinic during regular working hours, also how to reach the clinic after hours and on weekends. Pt and family provided the opportunity to ask questions - all questions answered to pt's and family satisfaction.  °

## 2022-09-26 ENCOUNTER — Other Ambulatory Visit: Payer: Self-pay

## 2022-09-26 ENCOUNTER — Encounter: Payer: Self-pay | Admitting: Hematology

## 2022-09-28 ENCOUNTER — Other Ambulatory Visit: Payer: Self-pay

## 2022-09-29 ENCOUNTER — Other Ambulatory Visit (HOSPITAL_COMMUNITY): Payer: Self-pay | Admitting: Physician Assistant

## 2022-09-30 ENCOUNTER — Other Ambulatory Visit: Payer: Self-pay

## 2022-09-30 ENCOUNTER — Encounter (HOSPITAL_COMMUNITY): Payer: Self-pay

## 2022-09-30 ENCOUNTER — Ambulatory Visit (HOSPITAL_COMMUNITY)
Admission: RE | Admit: 2022-09-30 | Discharge: 2022-09-30 | Disposition: A | Discharge status: Home / Self Care | Payer: Medicaid Other | Source: Ambulatory Visit | Attending: Hematology | Admitting: Hematology

## 2022-09-30 VITALS — BP 107/65 | HR 80 | Temp 97.3°F | Resp 16 | Ht 69.0 in | Wt 184.1 lb

## 2022-09-30 DIAGNOSIS — C538 Malignant neoplasm of overlapping sites of cervix uteri: Secondary | ICD-10-CM | POA: Insufficient documentation

## 2022-09-30 DIAGNOSIS — C7800 Secondary malignant neoplasm of unspecified lung: Secondary | ICD-10-CM | POA: Insufficient documentation

## 2022-09-30 DIAGNOSIS — C786 Secondary malignant neoplasm of retroperitoneum and peritoneum: Secondary | ICD-10-CM | POA: Insufficient documentation

## 2022-09-30 DIAGNOSIS — C787 Secondary malignant neoplasm of liver and intrahepatic bile duct: Secondary | ICD-10-CM | POA: Insufficient documentation

## 2022-09-30 DIAGNOSIS — Z95828 Presence of other vascular implants and grafts: Secondary | ICD-10-CM

## 2022-09-30 DIAGNOSIS — Z452 Encounter for adjustment and management of vascular access device: Secondary | ICD-10-CM | POA: Diagnosis not present

## 2022-09-30 DIAGNOSIS — F17210 Nicotine dependence, cigarettes, uncomplicated: Secondary | ICD-10-CM | POA: Insufficient documentation

## 2022-09-30 HISTORY — PX: IR IMAGING GUIDED PORT INSERTION: IMG5740

## 2022-09-30 LAB — PREGNANCY, URINE: Preg Test, Ur: NEGATIVE

## 2022-09-30 MED ORDER — MIDAZOLAM HCL 2 MG/2ML IJ SOLN
INTRAMUSCULAR | Status: AC
  Filled 2022-09-30: qty 2

## 2022-09-30 MED ORDER — MIDAZOLAM HCL 2 MG/2ML IJ SOLN
INTRAMUSCULAR | Status: AC | PRN
  Administered 2022-09-30 (×2): 1 mg via INTRAVENOUS

## 2022-09-30 MED ORDER — FENTANYL CITRATE (PF) 100 MCG/2ML IJ SOLN
INTRAMUSCULAR | Status: AC | PRN
  Administered 2022-09-30 (×2): 50 ug via INTRAVENOUS

## 2022-09-30 MED ORDER — SODIUM CHLORIDE 0.9 % IV SOLN: INTRAVENOUS | Status: DC

## 2022-09-30 MED ORDER — FENTANYL CITRATE (PF) 100 MCG/2ML IJ SOLN
INTRAMUSCULAR | Status: AC
  Filled 2022-09-30: qty 2

## 2022-09-30 MED ORDER — HEPARIN SOD (PORK) LOCK FLUSH 100 UNIT/ML IV SOLN
INTRAVENOUS | Status: AC
  Administered 2022-09-30: 500 [IU]
  Filled 2022-09-30: qty 5

## 2022-09-30 MED ORDER — LIDOCAINE-EPINEPHRINE 1 %-1:100000 IJ SOLN
INTRAMUSCULAR | Status: AC
  Administered 2022-09-30: 20 mL
  Filled 2022-09-30: qty 1

## 2022-09-30 NOTE — Procedures (Signed)
Interventional Radiology Procedure Note ° °Procedure: Placement of a right IJ approach single lumen PowerPort.  Tip is positioned at the superior cavoatrial junction and catheter is ready for immediate use.  °Complications: None °Recommendations:  °- Ok to shower tomorrow °- Do not submerge for 7 days °- Routine line care  ° °Signed, ° °Sephira Zellman S. Alishia Lebo, DO ° ° °

## 2022-09-30 NOTE — Progress Notes (Signed)
Patient was given discharge instructions. She verbalized understanding. 

## 2022-09-30 NOTE — H&P (Signed)
Chief Complaint: Patient was seen in consultation today for Cervical cancer- Port a cath placement at the request of [place]  Referring Physician(s): Firefighter  Supervising Physician: Corrie Mckusick  Patient Status: [facility] - Out-pt  History of Present Illness: Kristin Carson is a 52 y.o. female   New Dx cervical cancer-- vaginal bleeding off and on x 7-9 months Metastasis to lung; peritoneum and liver Staring chemo 10/02/22  For Port a cath placement per Dr Delton Coombes order  Past Medical History:  Diagnosis Date   Anemia    Asthma    Bronchitis    COPD (chronic obstructive pulmonary disease) ([place])    Depression    GERD (gastroesophageal reflux disease)    Headache    Hypertension    Port-A-Cath in place 09/25/2022    Past Surgical History:  Procedure Laterality Date   BREAST SURGERY     CESAREAN SECTION     LEG SURGERY      Allergies: Carrot [daucus carota], Lisinopril-hydrochlorothiazide, Ibuprofen, Tramadol, and Erythromycin  Medications: Prior to Admission medications   Medication Sig Start Date End Date Taking? Authorizing Provider  amLODipine (NORVASC) 10 MG tablet Take 1 tablet (10 mg total) by mouth daily. 08/31/22  Yes Pokhrel, Laxman, MD  HYDROcodone-acetaminophen (NORCO/VICODIN) 5-325 MG tablet Take 1 tablet by mouth every 12 (twelve) hours as needed for moderate pain. 09/23/22  Yes Derek Jack, MD  Atezolizumab (TECENTRIQ IV) Inject into the vein every 21 ( twenty-one) days. 10/02/22   [provider]  Bevacizumab (AVASTIN IV) Inject into the vein every 21 ( twenty-one) days.    [provider]  CISPLATIN IV Inject into the vein every 21 ( twenty-one) days. 10/02/22   [provider]  dexamethasone (DECADRON) 4 MG tablet Take 2 tablets daily x 3 days starting the day after cisplatin chemotherapy. Take with food. 09/25/22   Derek Jack, MD  lidocaine-prilocaine (EMLA) cream Apply a dime-sized  amount to port a cath site and cover with plastic wrap 1 hour prior to infusion appointments 09/25/22   Derek Jack, MD  ondansetron (ZOFRAN) 8 MG tablet Take 1 tablet (8 mg total) by mouth every 8 (eight) hours as needed for nausea or vomiting. Start on the third day after cisplatin. 09/25/22   Derek Jack, MD  PACLITAXEL IV Inject into the vein every 21 ( twenty-one) days.    [provider]  prochlorperazine (COMPAZINE) 10 MG tablet Take 1 tablet (10 mg total) by mouth every 6 (six) hours as needed (Nausea or vomiting). 09/25/22   Derek Jack, MD  famotidine (PEPCID) 20 MG tablet Take 1 tablet (20 mg total) by mouth 2 (two) times daily. Patient not taking: Reported on 01/12/2018 12/18/17 02/02/20  Rolland Porter, MD  hydrochlorothiazide (HYDRODIURIL) 25 MG tablet Take 1 tablet (25 mg total) by mouth daily. 06/17/19 02/02/20  Rolland Porter, MD     Family History  Problem Relation Age of Onset   Dermatomyositis Mother    Hypertension Mother    Cancer Mother        lung   Cancer Father        lung   Heart disease Father    Colon cancer Neg Hx    Breast cancer Neg Hx    Ovarian cancer Neg Hx    Endometrial cancer Neg Hx    Pancreatic cancer Neg Hx    Prostate cancer Neg Hx     Social History   Socioeconomic History   Marital status: Married  Spouse name: Not on file   Number of children: Not on file   Years of education: Not on file   Highest education level: Not on file  Occupational History   Not on file  Tobacco Use   Smoking status: Every Day    Packs/day: 1.00    Years: 23.00    Total pack years: 23.00    Types: Cigarettes   Smokeless tobacco: Never  Vaping Use   Vaping Use: Never used  Substance and Sexual Activity   Alcohol use: Yes    Comment: Beer on occ   Drug use: Yes    Types: Marijuana   Sexual activity: Yes    Birth control/protection: None  Other Topics Concern   Not on file  Social History Narrative   Not on file   Social  Determinants of Health   Financial Resource Strain: Low Risk  (09/09/2022)   Overall Financial Resource Strain (CARDIA)    Difficulty of Paying Living Expenses: Not hard at all  Food Insecurity: No Food Insecurity (09/22/2022)   Hunger Vital Sign    Worried About Running Out of Food in the Last Year: Never true    Ran Out of Food in the Last Year: Never true  Transportation Needs: No Transportation Needs (09/22/2022)   PRAPARE - Hydrologist (Medical): No    Lack of Transportation (Non-Medical): No  Recent Concern: Transportation Needs - Unmet Transportation Needs (09/09/2022)   PRAPARE - Transportation    Lack of Transportation (Medical): Yes    Lack of Transportation (Non-Medical): Yes  Physical Activity: Insufficiently Active (09/09/2022)   Exercise Vital Sign    Days of Exercise per Week: 2 days    Minutes of Exercise per Session: 20 min  Stress: No Stress Concern Present (09/09/2022)   [place]    Feeling of Stress : Only a little  Social Connections: Socially Isolated (09/09/2022)   Social Connection and Isolation Panel [NHANES]    Frequency of Communication with Friends and Family: Three times a week    Frequency of Social Gatherings with Friends and Family: More than three times a week    Attends Religious Services: Never    Marine scientist or Organizations: No    Attends Archivist Meetings: Never    Marital Status: Separated    Review of Systems: A 12 point ROS discussed and pertinent positives are indicated in the HPI above.  All other systems are negative.  Review of Systems  Constitutional:  Negative for activity change.  Gastrointestinal:  Negative for abdominal pain.  Genitourinary:  Positive for vaginal bleeding.  Musculoskeletal:  Negative for back pain and gait problem.  Psychiatric/Behavioral:  Negative for behavioral problems and confusion.     Vital  Signs: BP 128/79   Pulse 87   Temp (!) 97.3 F (36.3 C) (Temporal)   Resp 17   Ht '5\' 9"'$  (1.753 m)   Wt 184 lb 1.4 oz (83.5 kg)   LMP 08/15/2022 (Approximate)   SpO2 99%   BMI 27.18 kg/m     Physical Exam Vitals reviewed.  Cardiovascular:     Rate and Rhythm: Normal rate and regular rhythm.     Heart sounds: Normal heart sounds.  Pulmonary:     Effort: Pulmonary effort is normal.     Breath sounds: Normal breath sounds.  Abdominal:     Palpations: Abdomen is soft.  Musculoskeletal:  General: Normal range of motion.  Skin:    General: Skin is warm.  Neurological:     Mental Status: She is alert and oriented to person, place, and time.  Psychiatric:        Behavior: Behavior normal.     Imaging: No results found.  Labs:  CBC: Recent Labs    08/29/22 1249 08/30/22 0515 08/30/22 1126 09/22/22 0956  WBC 17.0* 11.8* 12.4* 11.0*  HGB 9.4* 9.3* 8.5* 9.2*  HCT 29.5* 28.3* 26.9* 29.0*  PLT 219 243 244 381    COAGS: Recent Labs    08/29/22 0407  INR 1.0    BMP: Recent Labs    08/29/22 0407 08/30/22 0515 08/31/22 0635 09/22/22 0956  NA 134* 135 135 132*  K 3.7 3.3* 3.5 4.5  CL 104 106 105 102  CO2 19* '22 24 24  '$ GLUCOSE 85 102* 104* 102*  BUN 22* 9 5* 14  CALCIUM 8.2* 7.9* 7.9* 8.4*  CREATININE 1.60* 0.89 0.66 0.95  GFRNONAA 39* >60 >60 >60    LIVER FUNCTION TESTS: Recent Labs    08/28/22 1111 08/29/22 0407 09/22/22 0956  BILITOT 0.6 0.6 0.3  AST 32 23 19  ALT '21 15 10  '$ ALKPHOS 69 56 68  PROT 8.9* 7.3 8.0  ALBUMIN 3.5 2.9* 3.1*    TUMOR MARKERS: No results for input(s): "AFPTM", "CEA", "CA199", "CHROMGRNA" in the last 8760 hours.  Assessment and Plan:  Cervical cancer Metastasis lung, peritoneum and liver Scheduled for chemo to start 10/02/22 Port a cath placement in IR today   Thank you for this interesting consult.  I greatly enjoyed [place] and look forward to participating in their care.  A copy of this  report was sent to the requesting provider on this date.  Electronically Signed: Lavonia Drafts, PA-C 09/30/2022, 12:31 PM   I spent a total of  30 Minutes   in face to face in clinical consultation, greater than 50% of which was counseling/coordinating care for [facility] placement

## 2022-10-01 ENCOUNTER — Ambulatory Visit (HOSPITAL_COMMUNITY): Payer: Medicaid Other

## 2022-10-01 ENCOUNTER — Encounter (HOSPITAL_COMMUNITY): Payer: Medicaid Other

## 2022-10-01 NOTE — Progress Notes (Signed)
[facility] [address] [place] [address]   CLINIC:  Medical Oncology/Hematology  Patient Care Team: Pcp, No as PCP - General Derek Jack, MD as Medical Oncologist (Medical Oncology) Brien Mates, RN as Oncology Nurse Navigator (Medical Oncology)   PRIOR THERAPY: None  CURRENT THERAPY: Cisplatin + Paclitaxel + Bevacizumab   CHIEF COMPLAINTS:  Metastatic cervical squamous cell carcinoma.   INTERVAL HISTORY: Kristin Carson is a 52 y.o. female presenting for follow up of Metastatic cervical squamous cell carcinoma and evaluation prior to cycle 1 of chemotherapy. She was last seen by me in consult on 09/22/22.   Today, she states that she is doing well overall. Her appetite level is at 100%. Her energy level is at 90%. She reports 3/10 pain around her chest port site. In regards to her missed NM bone scan and CT chest appointments, she states that she was unaware that she had appointments for these. She states that she continues to have vaginal bleeding intermittently but it is less severe than it was at our prior visit. She is scheduled to see her gyn onc Dr. Jeral Pinch on 10/10/22. She is using Compazine at home with good nausea control.   HISTORY OF PRESENTING ILLNESS:  Kristin Carson 52 y.o. female is seen in consultation today at the request of Dr. Berline Lopes for newly diagnosed metastatic cervical squamous cell carcinoma.  She has been having vaginal bleeding on and off for the last 6 to 9 months.  She also reported suprapubic pain which radiates to lower back and left leg.  She does not report any weight loss.  Denies any tingling or numbness in the extremities or ringing in the ears or decreased hearing.  Has occasional headaches and good appetite.  A CT scan of the abdomen and pelvis on 08/30/2022 showed uterine/cervical mass with metastatic abdominal pelvic lymph nodes, multiple lung nodules, 1 liver nodule, peritoneal and omental disease.  She had  endometrial and cervical biopsy consistent with moderate to poorly differentiated squamous cell carcinoma.  She missed appointment to see Dr. Berline Lopes on Friday.  She is here today with her roommate.  MEDICAL HISTORY:  Past Medical History:  Diagnosis Date   Anemia    Asthma    Bronchitis    COPD (chronic obstructive pulmonary disease) (HCC)    Depression    GERD (gastroesophageal reflux disease)    Headache    Hypertension    Port-A-Cath in place 09/25/2022    SURGICAL HISTORY: Past Surgical History:  Procedure Laterality Date   BREAST SURGERY     CESAREAN SECTION     IR IMAGING GUIDED PORT INSERTION  09/30/2022   LEG SURGERY      SOCIAL HISTORY: Social History   Socioeconomic History   Marital status: Married    Spouse name: Not on file   Number of children: Not on file   Years of education: Not on file   Highest education level: Not on file  Occupational History   Not on file  Tobacco Use   Smoking status: Every Day    Packs/day: 1.00    Years: 23.00    Total pack years: 23.00    Types: Cigarettes   Smokeless tobacco: Never  Vaping Use   Vaping Use: Never used  Substance and Sexual Activity   Alcohol use: Yes    Comment: Beer on occ   Drug use: Yes    Types: Marijuana   Sexual activity: Yes  Birth control/protection: None  Other Topics Concern   Not on file  Social History Narrative   Not on file   Social Determinants of Health   Financial Resource Strain: Low Risk  (09/09/2022)   Overall Financial Resource Strain (CARDIA)    Difficulty of Paying Living Expenses: Not hard at all  Food Insecurity: No Food Insecurity (09/22/2022)   Hunger Vital Sign    Worried About Running Out of Food in the Last Year: Never true    Ran Out of Food in the Last Year: Never true  Transportation Needs: No Transportation Needs (09/22/2022)   PRAPARE - Hydrologist (Medical): No    Lack of Transportation (Non-Medical): No  Recent Concern:  Transportation Needs - Unmet Transportation Needs (09/09/2022)   PRAPARE - Transportation    Lack of Transportation (Medical): Yes    Lack of Transportation (Non-Medical): Yes  Physical Activity: Insufficiently Active (09/09/2022)   Exercise Vital Sign    Days of Exercise per Week: 2 days    Minutes of Exercise per Session: 20 min  Stress: No Stress Concern Present (09/09/2022)   [place]    Feeling of Stress : Only a little  Social Connections: Socially Isolated (09/09/2022)   Social Connection and Isolation Panel [NHANES]    Frequency of Communication with Friends and Family: Three times a week    Frequency of Social Gatherings with Friends and Family: More than three times a week    Attends Religious Services: Never    Marine scientist or Organizations: No    Attends Archivist Meetings: Never    Marital Status: Separated  Intimate Partner Violence: Not At Risk (09/22/2022)   Humiliation, Afraid, Rape, and Kick questionnaire    Fear of Current or Ex-Partner: No    Emotionally Abused: No    Physically Abused: No    Sexually Abused: No    FAMILY HISTORY: Family History  Problem Relation Age of Onset   Dermatomyositis Mother    Hypertension Mother    Cancer Mother        lung   Cancer Father        lung   Heart disease Father    Colon cancer Neg Hx    Breast cancer Neg Hx    Ovarian cancer Neg Hx    Endometrial cancer Neg Hx    Pancreatic cancer Neg Hx    Prostate cancer Neg Hx     ALLERGIES:  is allergic to carrot [daucus carota], lisinopril-hydrochlorothiazide, ibuprofen, tramadol, and erythromycin.  MEDICATIONS:  Current Outpatient Medications  Medication Sig Dispense Refill   amLODipine (NORVASC) 10 MG tablet Take 1 tablet (10 mg total) by mouth daily. 30 tablet 2   Atezolizumab (TECENTRIQ IV) Inject into the vein every 21 ( twenty-one) days.     Bevacizumab (AVASTIN IV) Inject into the  vein every 21 ( twenty-one) days.     CISPLATIN IV Inject into the vein every 21 ( twenty-one) days.     dexamethasone (DECADRON) 4 MG tablet Take 2 tablets daily x 3 days starting the day after cisplatin chemotherapy. Take with food. 30 tablet 1   HYDROcodone-acetaminophen (NORCO/VICODIN) 5-325 MG tablet Take 1 tablet by mouth every 12 (twelve) hours as needed for moderate pain. 30 tablet 0   lidocaine-prilocaine (EMLA) cream Apply a dime-sized amount to port a cath site and cover with plastic wrap 1 hour prior to infusion appointments 30 g  3   ondansetron (ZOFRAN) 8 MG tablet Take 1 tablet (8 mg total) by mouth every 8 (eight) hours as needed for nausea or vomiting. Start on the third day after cisplatin. 30 tablet 1   PACLITAXEL IV Inject into the vein every 21 ( twenty-one) days.     prochlorperazine (COMPAZINE) 10 MG tablet Take 1 tablet (10 mg total) by mouth every 6 (six) hours as needed (Nausea or vomiting). 30 tablet 1   No current facility-administered medications for this visit.   Facility-Administered Medications Ordered in Other Visits  Medication Dose Route Frequency Provider Last Rate Last Admin   cetirizine (QUZYTTIR) injection 10 mg  10 mg Intravenous Once Derek Jack, MD       dexamethasone (DECADRON) 10 mg in sodium chloride 0.9 % 50 mL IVPB  10 mg Intravenous Once Derek Jack, MD       famotidine (PEPCID) IVPB 20 mg premix  20 mg Intravenous Once Derek Jack, MD       fosaprepitant (EMEND) 150 mg in sodium chloride 0.9 % 145 mL IVPB  150 mg Intravenous Once Derek Jack, MD       magnesium sulfate IVPB 2 g 50 mL  2 g Intravenous Once Derek Jack, MD 50 mL/hr at 10/02/22 0911 2 g at 10/02/22 0911   palonosetron (ALOXI) injection 0.25 mg  0.25 mg Intravenous Once Derek Jack, MD        REVIEW OF SYSTEMS:   Review of Systems  Constitutional:  Negative for chills, fatigue and fever.  HENT:   Negative for lump/mass, mouth  sores, nosebleeds, sore throat and trouble swallowing.   Eyes:  Negative for eye problems.  Respiratory:  Positive for cough.   Cardiovascular:  Negative for chest pain, leg swelling and palpitations.  Gastrointestinal:  Positive for constipation and nausea. Negative for abdominal pain, diarrhea and vomiting.  Genitourinary:  Positive for vaginal bleeding. Negative for bladder incontinence, difficulty urinating, dysuria, frequency, hematuria and nocturia.   Musculoskeletal:  Positive for myalgias (port site). Negative for arthralgias, back pain, flank pain and neck pain.  Skin:  Negative for itching and rash.  Neurological:  Negative for dizziness, headaches and numbness.  Hematological:  Does not bruise/bleed easily.  Psychiatric/Behavioral:  Negative for depression, sleep disturbance and suicidal ideas. The patient is nervous/anxious.   All other systems reviewed and are negative.    PHYSICAL EXAMINATION: ECOG PERFORMANCE STATUS: 1 - Symptomatic but completely ambulatory  There were no vitals filed for this visit.  There were no vitals filed for this visit.   Physical Exam Vitals reviewed. Exam conducted with a chaperone present.  Constitutional:      Appearance: Normal appearance.  Cardiovascular:     Rate and Rhythm: Normal rate and regular rhythm.     Pulses: Normal pulses.     Heart sounds: Normal heart sounds.  Pulmonary:     Effort: Pulmonary effort is normal.     Breath sounds: Normal breath sounds.  Abdominal:     Palpations: Abdomen is soft. There is no hepatomegaly, splenomegaly or mass.     Tenderness: There is no abdominal tenderness.  Lymphadenopathy:     Upper Body:     Right upper body: No supraclavicular, axillary or pectoral adenopathy.     Left upper body: No supraclavicular, axillary or pectoral adenopathy.  Neurological:     General: No focal deficit present.     Mental Status: She is alert and oriented to person, place, and time.  Psychiatric:  Mood and Affect: Mood normal.        Behavior: Behavior normal.      LABORATORY DATA:  I have reviewed the data as listed Lab Results  Component Value Date   WBC 8.7 10/02/2022   HGB 8.7 (L) 10/02/2022   HCT 27.1 (L) 10/02/2022   MCV 79.5 (L) 10/02/2022   PLT 368 10/02/2022     Chemistry      Component Value Date/Time   NA 132 (L) 10/02/2022 0838   K 4.0 10/02/2022 0838   CL 99 10/02/2022 0838   CO2 24 10/02/2022 0838   BUN 8 10/02/2022 0838   CREATININE 1.00 10/02/2022 0838   CREATININE 1.25 (H) 11/25/2016 1053      Component Value Date/Time   CALCIUM 8.4 (L) 10/02/2022 0838   ALKPHOS 63 10/02/2022 0838   AST 20 10/02/2022 0838   ALT 8 10/02/2022 0838   BILITOT 0.2 (L) 10/02/2022 3382       RADIOGRAPHIC STUDIES: I have personally reviewed the radiological images as listed and agreed with the findings in the report. IR IMAGING GUIDED PORT INSERTION  Result Date: 09/30/2022 INDICATION: 52 year old female referred for port catheter placement EXAM: IMAGE GUIDED PORT CATHETER MEDICATIONS: None ANESTHESIA/SEDATION: Moderate (conscious) sedation was employed during this procedure. A total of Versed 2.0 mg and Fentanyl 100 mcg was administered intravenously. Moderate Sedation Time: 26 minutes. The patient's level of consciousness and vital signs were monitored continuously by radiology nursing throughout the procedure under my direct supervision. FLUOROSCOPY TIME:  Fluoroscopy Time: 0 minutes 6 seconds (1 mGy). COMPLICATIONS: None PROCEDURE: Informed written consent was obtained from the patient after a thorough discussion of the procedural risks, benefits and alternatives. All questions were addressed. Maximal Sterile Barrier Technique was utilized including caps, mask, sterile gowns, sterile gloves, sterile drape, hand hygiene and skin antiseptic. A timeout was performed prior to the initiation of the procedure. Ultrasound survey was performed with images stored and sent to PACs.  Right IJ vein documented to be patent. The right neck and chest was prepped with chlorhexidine, and draped in the usual sterile fashion using maximum barrier technique (cap and mask, sterile gown, sterile gloves, large sterile sheet, hand hygiene and cutaneous antiseptic). Local anesthesia was attained by infiltration with 1% lidocaine without epinephrine. Ultrasound demonstrated patency of the right internal jugular vein, and this was documented with an image. Under real-time ultrasound guidance, this vein was accessed with a 21 gauge micropuncture needle and image documentation was performed. A small dermatotomy was made at the access site with an 11 scalpel. A 0.018" wire was advanced into the SVC and used to estimate the length of the internal catheter. The access needle exchanged for a 38F micropuncture vascular sheath. The 0.018" wire was then removed and a 0.035" wire advanced into the IVC. An appropriate location for the subcutaneous reservoir was selected below the clavicle and an incision was made through the skin and underlying soft tissues. The subcutaneous tissues were then dissected using a combination of blunt and sharp surgical technique and a pocket was formed. A single lumen power injectable portacatheter was then tunneled through the subcutaneous tissues from the pocket to the dermatotomy and the port reservoir placed within the subcutaneous pocket. The venous access site was then serially dilated and a peel away vascular sheath placed over the wire. The wire was removed and the port catheter advanced into position under fluoroscopic guidance. The catheter tip is positioned in the cavoatrial junction. This was documented with a spot  image. The portacatheter was then tested and found to flush and aspirate well. The port was flushed with saline followed by 100 units/mL heparinized saline. The pocket was then closed in two layers using first subdermal inverted interrupted absorbable sutures followed  by a running subcuticular suture. The epidermis was then sealed with Dermabond. Steri-Strips were placed. The dermatotomy at the venous access site was also seal with Dermabond. Patient tolerated the procedure well and remained hemodynamically stable throughout. No complications encountered and no significant blood loss encountered IMPRESSION: Status post right IJ port catheter. Signed, Dulcy Fanny. Nadene Rubins, RPVI [facility] Electronically Signed   By: Corrie Mckusick D.O.   On: 09/30/2022 15:13    ASSESSMENT:  1.  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 - CTAP (08/30/2022): Irregularity of the endometrium extending through the cervix with foci of gas in the cervix.  Multifocal omental and peritoneal metastasis.  Metastatic left periaortic, aortocaval, left external iliac and right common iliac lymph nodes.  Multiple bilateral lung nodules at bases.  12 mm hypodensity in the liver near the falciform ligament. - Endometrial biopsy (09/09/2022): Invasive moderately to poorly differentiated squamous cell carcinoma with abundant necrosis.  Cervix biopsy: Invasive and in situ moderate to poorly differentiated squamous cell carcinoma.  High risk HPV positive.  2.  Social/family history: - She is married and lives at home with her husband and roommate.  She has never worked.  She is current active smoker, half pack per day, for the last 28 years.  She occasionally smokes marijuana. - Mother died of lung cancer.  Father had head and neck cancer.  Maternal grandmother had cancer.  Paternal uncle died of cancer.  Maternal uncle also had cancer.  PLAN:  1.  Metastatic cervical cancer to the lungs, peritoneum and liver: - She reportedly missed her CT scan of the chest and a bone scan. - We  will reschedule her scans. - We discussed cycle 1 of chemotherapy with cisplatin, paclitaxel and bevacizumab.  PD-L1 is still pending.  Will add pembrolizumab if PD-L1 positive.  Otherwise will consider adding Atezolizumab if it is approved by her insurance. - We will also follow-up on NGS results. - I reviewed her labs today.  Creatinine is 1.0 and LFTs are normal.  Urine protein is 30. - She will proceed with first cycle of chemotherapy with hydration.  She will be evaluated next week in our symptom management clinic.  I will see her back in 3 weeks for follow-up.  She was told to keep her appointment with Dr. Berline Lopes on 10/10/2022.  2.  Normocytic anemia: - Ferritin is 35 and percent saturation 7. - Recommend Feraheme weekly x 2. - Discussed side effects in detail.  3.  Abdominal pain: - Continue hydrocodone 5/325 every 12 hours as needed.   Orders Placed This Encounter  Procedures   CMP ([place] only)    Standing Status:   Future    Standing Expiration Date:   11/14/2023   Magnesium    Standing Status:   Future    Standing Expiration Date:   11/13/2023   UA Protein, Dipstick - [facility]    Standing Status:   Future    Standing Expiration Date:   11/13/2023   CBC with Differential    Standing Status:   Future  Standing Expiration Date:   11/14/2023    All questions were answered. The patient knows to call the clinic with any problems, questions or concerns.   I,Alexis Herring,acting as a Education administrator for [employer], MD.,have documented all relevant documentation on the behalf of Derek Jack, MD,as directed by  Derek Jack, MD while in the presence of Derek Jack, MD.  I, Derek Jack MD, have reviewed the above documentation for accuracy and completeness, and I agree with the above.     Eugene Gavia 10/02/2022 9:38 AM

## 2022-10-02 ENCOUNTER — Inpatient Hospital Stay: Payer: Medicaid Other | Admitting: Licensed Clinical Social Worker

## 2022-10-02 ENCOUNTER — Inpatient Hospital Stay: Payer: Medicaid Other

## 2022-10-02 ENCOUNTER — Inpatient Hospital Stay: Payer: Medicaid Other | Attending: Hematology

## 2022-10-02 ENCOUNTER — Inpatient Hospital Stay (HOSPITAL_BASED_OUTPATIENT_CLINIC_OR_DEPARTMENT_OTHER): Payer: Medicaid Other | Admitting: Hematology

## 2022-10-02 ENCOUNTER — Inpatient Hospital Stay: Payer: Medicaid Other | Admitting: Dietician

## 2022-10-02 VITALS — BP 131/74 | HR 91 | Temp 97.1°F | Resp 18

## 2022-10-02 DIAGNOSIS — D509 Iron deficiency anemia, unspecified: Secondary | ICD-10-CM | POA: Diagnosis not present

## 2022-10-02 DIAGNOSIS — C539 Malignant neoplasm of cervix uteri, unspecified: Secondary | ICD-10-CM | POA: Insufficient documentation

## 2022-10-02 DIAGNOSIS — C787 Secondary malignant neoplasm of liver and intrahepatic bile duct: Secondary | ICD-10-CM | POA: Diagnosis not present

## 2022-10-02 DIAGNOSIS — D500 Iron deficiency anemia secondary to blood loss (chronic): Secondary | ICD-10-CM | POA: Insufficient documentation

## 2022-10-02 DIAGNOSIS — Z95828 Presence of other vascular implants and grafts: Secondary | ICD-10-CM

## 2022-10-02 DIAGNOSIS — Z5111 Encounter for antineoplastic chemotherapy: Secondary | ICD-10-CM | POA: Insufficient documentation

## 2022-10-02 DIAGNOSIS — Z79899 Other long term (current) drug therapy: Secondary | ICD-10-CM | POA: Insufficient documentation

## 2022-10-02 DIAGNOSIS — C538 Malignant neoplasm of overlapping sites of cervix uteri: Secondary | ICD-10-CM

## 2022-10-02 DIAGNOSIS — R109 Unspecified abdominal pain: Secondary | ICD-10-CM | POA: Diagnosis not present

## 2022-10-02 DIAGNOSIS — C7801 Secondary malignant neoplasm of right lung: Secondary | ICD-10-CM | POA: Diagnosis not present

## 2022-10-02 DIAGNOSIS — Z419 Encounter for procedure for purposes other than remedying health state, unspecified: Secondary | ICD-10-CM | POA: Diagnosis not present

## 2022-10-02 DIAGNOSIS — C7802 Secondary malignant neoplasm of left lung: Secondary | ICD-10-CM | POA: Insufficient documentation

## 2022-10-02 DIAGNOSIS — Z5112 Encounter for antineoplastic immunotherapy: Secondary | ICD-10-CM | POA: Insufficient documentation

## 2022-10-02 DIAGNOSIS — E860 Dehydration: Secondary | ICD-10-CM | POA: Diagnosis not present

## 2022-10-02 DIAGNOSIS — C786 Secondary malignant neoplasm of retroperitoneum and peritoneum: Secondary | ICD-10-CM | POA: Insufficient documentation

## 2022-10-02 DIAGNOSIS — F17210 Nicotine dependence, cigarettes, uncomplicated: Secondary | ICD-10-CM | POA: Diagnosis not present

## 2022-10-02 LAB — URINALYSIS, DIPSTICK ONLY
Bilirubin Urine: NEGATIVE
Glucose, UA: NEGATIVE mg/dL
Ketones, ur: NEGATIVE mg/dL
Nitrite: NEGATIVE
Protein, ur: 30 mg/dL — AB
Specific Gravity, Urine: 1.005 (ref 1.005–1.030)
pH: 6 (ref 5.0–8.0)

## 2022-10-02 LAB — CBC WITH DIFFERENTIAL/PLATELET
Abs Immature Granulocytes: 0.02 10*3/uL (ref 0.00–0.07)
Basophils Absolute: 0 10*3/uL (ref 0.0–0.1)
Basophils Relative: 0 %
Eosinophils Absolute: 0.2 10*3/uL (ref 0.0–0.5)
Eosinophils Relative: 2 %
HCT: 27.1 % — ABNORMAL LOW (ref 36.0–46.0)
Hemoglobin: 8.7 g/dL — ABNORMAL LOW (ref 12.0–15.0)
Immature Granulocytes: 0 %
Lymphocytes Relative: 28 %
Lymphs Abs: 2.4 10*3/uL (ref 0.7–4.0)
MCH: 25.5 pg — ABNORMAL LOW (ref 26.0–34.0)
MCHC: 32.1 g/dL (ref 30.0–36.0)
MCV: 79.5 fL — ABNORMAL LOW (ref 80.0–100.0)
Monocytes Absolute: 0.8 10*3/uL (ref 0.1–1.0)
Monocytes Relative: 9 %
Neutro Abs: 5.3 10*3/uL (ref 1.7–7.7)
Neutrophils Relative %: 61 %
Platelets: 368 10*3/uL (ref 150–400)
RBC: 3.41 MIL/uL — ABNORMAL LOW (ref 3.87–5.11)
RDW: 17.8 % — ABNORMAL HIGH (ref 11.5–15.5)
WBC: 8.7 10*3/uL (ref 4.0–10.5)
nRBC: 0 % (ref 0.0–0.2)

## 2022-10-02 LAB — COMPREHENSIVE METABOLIC PANEL
ALT: 8 U/L (ref 0–44)
AST: 20 U/L (ref 15–41)
Albumin: 3.1 g/dL — ABNORMAL LOW (ref 3.5–5.0)
Alkaline Phosphatase: 63 U/L (ref 38–126)
Anion gap: 9 (ref 5–15)
BUN: 8 mg/dL (ref 6–20)
CO2: 24 mmol/L (ref 22–32)
Calcium: 8.4 mg/dL — ABNORMAL LOW (ref 8.9–10.3)
Chloride: 99 mmol/L (ref 98–111)
Creatinine, Ser: 1 mg/dL (ref 0.44–1.00)
GFR, Estimated: >60 mL/min (ref >60)
Glucose, Bld: 102 mg/dL — ABNORMAL HIGH (ref 70–99)
Potassium: 4 mmol/L (ref 3.5–5.1)
Sodium: 132 mmol/L — ABNORMAL LOW (ref 135–145)
Total Bilirubin: 0.2 mg/dL — ABNORMAL LOW (ref 0.3–1.2)
Total Protein: 7.5 g/dL (ref 6.5–8.1)

## 2022-10-02 LAB — MAGNESIUM: Magnesium: 2 mg/dL (ref 1.7–2.4)

## 2022-10-02 MED ORDER — PALONOSETRON HCL INJECTION 0.25 MG/5ML
0.2500 mg | Freq: Once | INTRAVENOUS | Status: AC
  Administered 2022-10-02: 0.25 mg via INTRAVENOUS
  Filled 2022-10-02: qty 5

## 2022-10-02 MED ORDER — MAGNESIUM SULFATE 2 GM/50ML IV SOLN
2.0000 g | Freq: Once | INTRAVENOUS | Status: AC
  Administered 2022-10-02: 2 g via INTRAVENOUS
  Filled 2022-10-02: qty 50

## 2022-10-02 MED ORDER — CETIRIZINE HCL 10 MG/ML IV SOLN
10.0000 mg | Freq: Once | INTRAVENOUS | Status: AC
  Administered 2022-10-02: 10 mg via INTRAVENOUS
  Filled 2022-10-02: qty 1

## 2022-10-02 MED ORDER — SODIUM CHLORIDE 0.9 % IV SOLN
150.0000 mg | Freq: Once | INTRAVENOUS | Status: AC
  Administered 2022-10-02: 150 mg via INTRAVENOUS
  Filled 2022-10-02: qty 150

## 2022-10-02 MED ORDER — SODIUM CHLORIDE 0.9 % IV SOLN
50.0000 mg/m2 | Freq: Once | INTRAVENOUS | Status: AC
  Administered 2022-10-02: 100 mg via INTRAVENOUS
  Filled 2022-10-02: qty 100

## 2022-10-02 MED ORDER — SODIUM CHLORIDE 0.9 % IV SOLN
10.0000 mg | Freq: Once | INTRAVENOUS | Status: AC
  Administered 2022-10-02: 10 mg via INTRAVENOUS
  Filled 2022-10-02: qty 10

## 2022-10-02 MED ORDER — SODIUM CHLORIDE 0.9 % IV SOLN
135.0000 mg/m2 | Freq: Once | INTRAVENOUS | Status: AC
  Administered 2022-10-02: 276 mg via INTRAVENOUS
  Filled 2022-10-02: qty 46

## 2022-10-02 MED ORDER — SODIUM CHLORIDE 0.9% FLUSH
10.0000 mL | Freq: Once | INTRAVENOUS | Status: AC
  Administered 2022-10-02: 10 mL via INTRAVENOUS

## 2022-10-02 MED ORDER — POTASSIUM CHLORIDE IN NACL 20-0.9 MEQ/L-% IV SOLN
Freq: Once | INTRAVENOUS | Status: AC
  Filled 2022-10-02: qty 1000

## 2022-10-02 MED ORDER — SODIUM CHLORIDE 0.9 % IV SOLN
15.0000 mg/kg | Freq: Once | INTRAVENOUS | Status: AC
  Administered 2022-10-02: 1300 mg via INTRAVENOUS
  Filled 2022-10-02: qty 48

## 2022-10-02 MED ORDER — SODIUM CHLORIDE 0.9 % IV SOLN: Freq: Once | INTRAVENOUS | Status: AC

## 2022-10-02 MED ORDER — HEPARIN SOD (PORK) LOCK FLUSH 100 UNIT/ML IV SOLN
500.0000 [IU] | Freq: Once | INTRAVENOUS | Status: AC | PRN
  Administered 2022-10-02: 500 [IU]

## 2022-10-02 MED ORDER — SODIUM CHLORIDE 0.9% FLUSH
10.0000 mL | INTRAVENOUS | Status: DC | PRN
  Administered 2022-10-02: 10 mL

## 2022-10-02 MED ORDER — FAMOTIDINE IN NACL 20-0.9 MG/50ML-% IV SOLN
20.0000 mg | Freq: Once | INTRAVENOUS | Status: AC
  Administered 2022-10-02: 20 mg via INTRAVENOUS
  Filled 2022-10-02: qty 50

## 2022-10-02 NOTE — Progress Notes (Signed)
Pharmacist Chemotherapy Monitoring - Initial Assessment    Anticipated start date: 10/02/22   The following has been reviewed per standard work regarding the patient's treatment regimen: The patient's diagnosis, treatment plan and drug doses, and organ/hematologic function Lab orders and baseline tests specific to treatment regimen  The treatment plan start date, drug sequencing, and pre-medications Prior authorization status  Patient's documented medication list, including drug-drug interaction screen and prescriptions for anti-emetics and supportive care specific to the treatment regimen The drug concentrations, fluid compatibility, administration routes, and timing of the medications to be used The patient's access for treatment and lifetime cumulative dose history, if applicable  The patient's medication allergies and previous infusion related reactions, if applicable   Changes made to treatment plan:  N/A  Follow up needed:  N/A   Wynona Neat, [facility], 10/02/2022  9:10 AM

## 2022-10-02 NOTE — Progress Notes (Signed)
Pt presents today for Vegzelma, Paclitaxel, and Cisplatin per provider's order. Vital signs and labs WNL for treatment. Okay to proceed with treatment today per Dr.K.  Faythe Ghee to start fluids and pre-meds before lab results come back per Dr.K. Pt urinated 400 mL prior to Cisplatin administration.  Treatment given today per MD orders. Tolerated infusion without adverse affects. Vital signs stable. No complaints at this time. Discharged from clinic ambulatory in stable condition. Alert and oriented x 3. F/U with [facility] as scheduled.

## 2022-10-02 NOTE — Progress Notes (Signed)
Patient has been examined by Dr. Delton Coombes, and vital signs and labs have been reviewed. ANC, Creatinine, LFTs, hemoglobin, and platelets are within treatment parameters per M.D. - pt may proceed with treatment.  Hold Tecentriq today as PD-L1 results are pending. Primary RN and pharmacy notified.

## 2022-10-02 NOTE — Patient Instructions (Addendum)
[facility] Discharge Instructions   You were seen and examined today by Dr. Delton Coombes.  He reviewed the results of your lab work   We will proceed with your first treatment today.   We will see you back in one week to repeat lab work and to assess how you're feeling and see how you tolerated your first treatment.   We will reschedule your bone scan and CT chest, abdomen, and pelvis. Be sure to make those appointments.   Return as scheduled.    Thank you for choosing [place] [facility] to provide your oncology and hematology care.  To afford each patient quality time with our provider, please arrive at least 15 minutes before your scheduled appointment time.   If you have a lab appointment with the [place] please come in thru the Main Entrance and check in at the main information desk.  You need to re-schedule your appointment should you arrive 10 or more minutes late.  We strive to give you quality time with our providers, and arriving late affects you and other patients whose appointments are after yours.  Also, if you no show three or more times for appointments you may be dismissed from the clinic at the providers discretion.     Again, thank you for choosing [facility].  Our hope is that these requests will decrease the amount of time that you wait before being seen by our physicians.       _____________________________________________________________  Should you have questions after your visit to [facility], please contact our office at [phone] and follow the prompts.  Our office hours are 8:00 a.m. and 4:30 p.m. Monday - Friday.  Please note that voicemails left after 4:00 p.m. may not be returned until the following business day.  We are closed weekends and major holidays.  You do have access to a nurse 24-7, just call the main number to the clinic [phone] and do not  press any options, hold on the line and a nurse will answer the phone.    For prescription refill requests, have your pharmacy contact our office and allow 72 hours.    Due to Covid, you will need to wear a mask upon entering the hospital. If you do not have a mask, a mask will be given to you at the Main Entrance upon arrival. For doctor visits, patients may have 1 support person age 66 or older with them. For treatment visits, patients can not have anyone with them due to social distancing guidelines and our immunocompromised population.

## 2022-10-02 NOTE — Progress Notes (Signed)
Nutrition Assessment   Reason for Assessment: New patient    ASSESSMENT: 52 year old female with cervical cancer metastatic to lung, peritoneum, and liver. She is receiving cisplatin/paclitaxel + bevacizumab q21d.   Past medical history includes COPD, CKD, IDA, HTN  Met with patient in infusion. Introduced self and services available at [facility]. Patient appreciative and is agreeable to visit. She reports good appetite. Patient recalls 2 good meals and frequent snacks. Patient says reports feeling hungry at time of visit. Her husband is going out to get her some food. She endorses good family support, says they are going to make sure I am eating. Patient denies changes in weight, nausea, vomiting, diarrhea, constipation.   Nutrition Focused Physical Exam: deferred    Medications: amlodipine, norco, zofran, compazine   Labs: Na 132, glucose 112, albumin 3.1   Anthropometrics:   Height: 5'4" Weight: 186 lb  UBW: 185-190 lb  BMI: 30.99   NUTRITION DIAGNOSIS: Food and nutrition related knowledge deficit related to newly diagnosed cancer as evidenced by no prior need for associated nutrition information    INTERVENTION:  Educated on importance of adequate calorie and protein energy intake to maintain weights, strength during treatment Educated on foods with protein, encouraged protein source with all meals and snacks - handouts with snack ideas + high protein foods list given Contact information provided   MONITORING, EVALUATION, GOAL: Patient will tolerate adequate calories and protein to maintain stable weights    Next Visit: To be scheduled as needed - encouraged to contact with nutrition questions/concerns

## 2022-10-02 NOTE — Progress Notes (Signed)
1255 patients her right arm feels achy and numb per patients words. VSS. No complaints of any pain anywhere else. Patient denies any shortness of breath, chest heaviness, itching or feelings of having pain anywhere else.   12:59 pm Patient sitting upright in bed and states her arm is feeling a little better.

## 2022-10-02 NOTE — Patient Instructions (Signed)
[facility]  Discharge Instructions: Thank you for choosing [facility] to provide your oncology and hematology care.  If you have a lab appointment with the [facility], please come in thru the Main Entrance and check in at the main information desk.  Wear comfortable clothing and clothing appropriate for easy access to any Portacath or PICC line.   We strive to give you quality time with your provider. You may need to reschedule your appointment if you arrive late (15 or more minutes).  Arriving late affects you and other patients whose appointments are after yours.  Also, if you miss three or more appointments without notifying the office, you may be dismissed from the clinic at the provider's discretion.      For prescription refill requests, have your pharmacy contact our office and allow 72 hours for refills to be completed.    Today you received the following chemotherapy and/or immunotherapy agents Vegzelma, Paclitaxel, Cisplatin   To help prevent nausea and vomiting after your treatment, we encourage you to take your nausea medication as directed.  Bevacizumab Injection What is this medication? BEVACIZUMAB (be va SIZ yoo mab) treats some types of cancer. It works by blocking a protein that causes cancer cells to grow and multiply. This helps to slow or stop the spread of cancer cells. It is a monoclonal antibody. This medicine may be used for other purposes; ask your health care provider or pharmacist if you have questions. COMMON BRAND NAME(S): Alymsys, Avastin, MVASI, Noah Charon What should I tell my care team before I take this medication? They need to know if you have any of these conditions: Blood clots Coughing up blood Having or recent surgery Heart failure High blood pressure History of a connection between 2 or more body parts that do not usually connect (fistula) History of a tear in your stomach or intestines Protein in your urine An  unusual or allergic reaction to bevacizumab, other medications, foods, dyes, or preservatives Pregnant or trying to get pregnant Breast-feeding How should I use this medication? This medication is injected into a vein. It is given by your care team in a hospital or clinic setting. Talk to your care team the use of this medication in children. Special care may be needed. Overdosage: If you think you have taken too much of this medicine contact a poison control center or emergency room at once. NOTE: This medicine is only for you. Do not share this medicine with others. What if I miss a dose? Keep appointments for follow-up doses. It is important not to miss your dose. Call your care team if you are unable to keep an appointment. What may interact with this medication? Interactions are not expected. This list may not describe all possible interactions. Give your health care provider a list of all the medicines, herbs, non-prescription drugs, or dietary supplements you use. Also tell them if you smoke, drink alcohol, or use illegal drugs. Some items may interact with your medicine. What should I watch for while using this medication? Your condition will be monitored carefully while you are receiving this medication. You may need blood work while taking this medication. This medication may make you feel generally unwell. This is not uncommon as chemotherapy can affect healthy cells as well as cancer cells. Report any side effects. Continue your course of treatment even though you feel ill unless your care team tells you to stop. This medication may increase your risk to bruise or  bleed. Call your care team if you notice any unusual bleeding. Before having surgery, talk to your care team to make sure it is ok. This medication can increase the risk of poor healing of your surgical site or wound. You will need to stop this medication for 28 days before surgery. After surgery, wait at least 28 days before  restarting this medication. Make sure the surgical site or wound is healed enough before restarting this medication. Talk to your care team if questions. Talk to your care team if you may be pregnant. Serious birth defects can occur if you take this medication during pregnancy and for 6 months after the last dose. Contraception is recommended while taking this medication and for 6 months after the last dose. Your care team can help you find the option that works for you. Do not breastfeed while taking this medication and for 6 months after the last dose. This medication can cause infertility. Talk to your care team if you are concerned about your fertility. What side effects may I notice from receiving this medication? Side effects that you should report to your care team as soon as possible: Allergic reactions--skin rash, itching, hives, swelling of the face, lips, tongue, or throat Bleeding--bloody or black, tar-like stools, vomiting blood or brown material that looks like coffee grounds, red or dark brown urine, small red or purple spots on skin, unusual bruising or bleeding Blood clot--pain, swelling, or warmth in the leg, shortness of breath, chest pain Heart attack--pain or tightness in the chest, shoulders, arms, or jaw, nausea, shortness of breath, cold or clammy skin, feeling faint or lightheaded Heart failure--shortness of breath, swelling of the ankles, feet, or hands, sudden weight gain, unusual weakness or fatigue Increase in blood pressure Infection--fever, chills, cough, sore throat, wounds that don't heal, pain or trouble when passing urine, general feeling of discomfort or being unwell Infusion reactions--chest pain, shortness of breath or trouble breathing, feeling faint or lightheaded Kidney injury--decrease in the amount of urine, swelling of the ankles, hands, or feet Stomach pain that is severe, does not go away, or gets worse Stroke--sudden numbness or weakness of the face,  arm, or leg, trouble speaking, confusion, trouble walking, loss of balance or coordination, dizziness, severe headache, change in vision Sudden and severe headache, confusion, change in vision, seizures, which may be signs of posterior reversible encephalopathy syndrome (PRES) Side effects that usually do not require medical attention (report to your care team if they continue or are bothersome): Back pain Change in taste Diarrhea Dry skin Increased tears Nosebleed This list may not describe all possible side effects. Call your doctor for medical advice about side effects. You may report side effects to FDA at 1-800-FDA-1088. Where should I keep my medication? This medication is given in a hospital or clinic. It will not be stored at home. NOTE: This sheet is a summary. It may not cover all possible information. If you have questions about this medicine, talk to your doctor, pharmacist, or health care provider.  2023 Elsevier/Gold Standard (2021-12-20 00:00:00)  Paclitaxel Injection What is this medication? PACLITAXEL (PAK li TAX el) treats some types of cancer. It works by slowing down the growth of cancer cells. This medicine may be used for other purposes; ask your health care provider or pharmacist if you have questions. COMMON BRAND NAME(S): Onxol, Taxol What should I tell my care team before I take this medication? They need to know if you have any of these conditions: Heart disease Liver  disease Low white blood cell levels An unusual or allergic reaction to paclitaxel, other medications, foods, dyes, or preservatives If you or your partner are pregnant or trying to get pregnant Breast-feeding How should I use this medication? This medication is injected into a vein. It is given by your care team in a hospital or clinic setting. Talk to your care team about the use of this medication in children. While it may be given to children for selected conditions, precautions do  apply. Overdosage: If you think you have taken too much of this medicine contact a poison control center or emergency room at once. NOTE: This medicine is only for you. Do not share this medicine with others. What if I miss a dose? Keep appointments for follow-up doses. It is important not to miss your dose. Call your care team if you are unable to keep an appointment. What may interact with this medication? Do not take this medication with any of the following: Live virus vaccines Other medications may affect the way this medication works. Talk with your care team about all of the medications you take. They may suggest changes to your treatment plan to lower the risk of side effects and to make sure your medications work as intended. This list may not describe all possible interactions. Give your health care provider a list of all the medicines, herbs, non-prescription drugs, or dietary supplements you use. Also tell them if you smoke, drink alcohol, or use illegal drugs. Some items may interact with your medicine. What should I watch for while using this medication? Your condition will be monitored carefully while you are receiving this medication. You may need blood work while taking this medication. This medication may make you feel generally unwell. This is not uncommon as chemotherapy can affect healthy cells as well as cancer cells. Report any side effects. Continue your course of treatment even though you feel ill unless your care team tells you to stop. This medication can cause serious allergic reactions. To reduce the risk, your care team may give you other medications to take before receiving this one. Be sure to follow the directions from your care team. This medication may increase your risk of getting an infection. Call your care team for advice if you get a fever, chills, sore throat, or other symptoms of a cold or flu. Do not treat yourself. Try to avoid being around people who are  sick. This medication may increase your risk to bruise or bleed. Call your care team if you notice any unusual bleeding. Be careful brushing or flossing your teeth or using a toothpick because you may get an infection or bleed more easily. If you have any dental work done, tell your dentist you are receiving this medication. Talk to your care team if you may be pregnant. Serious birth defects can occur if you take this medication during pregnancy. Talk to your care team before breastfeeding. Changes to your treatment plan may be needed. What side effects may I notice from receiving this medication? Side effects that you should report to your care team as soon as possible: Allergic reactions--skin rash, itching, hives, swelling of the face, lips, tongue, or throat Heart rhythm changes--fast or irregular heartbeat, dizziness, feeling faint or lightheaded, chest pain, trouble breathing Increase in blood pressure Infection--fever, chills, cough, sore throat, wounds that don't heal, pain or trouble when passing urine, general feeling of discomfort or being unwell Low blood pressure--dizziness, feeling faint or lightheaded, blurry vision Low red  blood cell level--unusual weakness or fatigue, dizziness, headache, trouble breathing Painful swelling, warmth, or redness of the skin, blisters or sores at the infusion site Pain, tingling, or numbness in the hands or feet Slow heartbeat--dizziness, feeling faint or lightheaded, confusion, trouble breathing, unusual weakness or fatigue Unusual bruising or bleeding Side effects that usually do not require medical attention (report to your care team if they continue or are bothersome): Diarrhea Hair loss Joint pain Loss of appetite Muscle pain Nausea Vomiting This list may not describe all possible side effects. Call your doctor for medical advice about side effects. You may report side effects to FDA at 1-800-FDA-1088. Where should I keep my  medication? This medication is given in a hospital or clinic. It will not be stored at home. NOTE: This sheet is a summary. It may not cover all possible information. If you have questions about this medicine, talk to your doctor, pharmacist, or health care provider.  2023 Elsevier/Gold Standard (2021-12-18 00:00:00)  Cisplatin Injection What is this medication? CISPLATIN (SIS pla tin) treats some types of cancer. It works by slowing down the growth of cancer cells. This medicine may be used for other purposes; ask your health care provider or pharmacist if you have questions. COMMON BRAND NAME(S): Platinol, Platinol -AQ What should I tell my care team before I take this medication? They need to know if you have any of these conditions: Eye disease, vision problems Hearing problems Kidney disease Low blood counts, such as low white cells, platelets, or red blood cells Tingling of the fingers or toes, or other nerve disorder An unusual or allergic reaction to cisplatin, carboplatin, oxaliplatin, other medications, foods, dyes, or preservatives If you or your partner are pregnant or trying to get pregnant Breast-feeding How should I use this medication? This medication is injected into a vein. It is given by your care team in a hospital or clinic setting. Talk to your care team about the use of this medication in children. Special care may be needed. Overdosage: If you think you have taken too much of this medicine contact a poison control center or emergency room at once. NOTE: This medicine is only for you. Do not share this medicine with others. What if I miss a dose? Keep appointments for follow-up doses. It is important not to miss your dose. Call your care team if you are unable to keep an appointment. What may interact with this medication? Do not take this medication with any of the following: Live virus vaccines This medication may also interact with the following: Certain  antibiotics, such as amikacin, gentamicin, neomycin, polymyxin B, streptomycin, tobramycin, vancomycin Foscarnet This list may not describe all possible interactions. Give your health care provider a list of all the medicines, herbs, non-prescription drugs, or dietary supplements you use. Also tell them if you smoke, drink alcohol, or use illegal drugs. Some items may interact with your medicine. What should I watch for while using this medication? Your condition will be monitored carefully while you are receiving this medication. You may need blood work done while taking this medication. This medication may make you feel generally unwell. This is not uncommon, as chemotherapy can affect healthy cells as well as cancer cells. Report any side effects. Continue your course of treatment even though you feel ill unless your care team tells you to stop. This medication may increase your risk of getting an infection. Call your care team for advice if you get a fever, chills, sore throat, or  other symptoms of a cold or flu. Do not treat yourself. Try to avoid being around people who are sick. Avoid taking medications that contain aspirin, acetaminophen, ibuprofen, naproxen, or ketoprofen unless instructed by your care team. These medications may hide a fever. This medication may increase your risk to bruise or bleed. Call your care team if you notice any unusual bleeding. Be careful brushing or flossing your teeth or using a toothpick because you may get an infection or bleed more easily. If you have any dental work done, tell your dentist you are receiving this medication. Drink fluids as directed while you are taking this medication. This will help protect your kidneys. Call your care team if you get diarrhea. Do not treat yourself. Talk to your care team if you or your partner wish to become pregnant or think you might be pregnant. This medication can cause serious birth defects if taken during pregnancy and  for 14 months after the last dose. A negative pregnancy test is required before starting this medication. A reliable form of contraception is recommended while taking this medication and for 14 months after the last dose. Talk to your care team about effective forms of contraception. Do not father a child while taking this medication and for 11 months after the last dose. Use a condom during sex during this time period. Do not breast-feed while taking this medication. This medication may cause infertility. Talk to your care team if you are concerned about your fertility. What side effects may I notice from receiving this medication? Side effects that you should report to your care team as soon as possible: Allergic reactions--skin rash, itching, hives, swelling of the face, lips, tongue, or throat Eye pain, change in vision, vision loss Hearing loss, ringing in ears Infection--fever, chills, cough, sore throat, wounds that don't heal, pain or trouble when passing urine, general feeling of discomfort or being unwell Kidney injury--decrease in the amount of urine, swelling of the ankles, hands, or feet Low red blood cell level--unusual weakness or fatigue, dizziness, headache, trouble breathing Painful swelling, warmth, or redness of the skin, blisters or sores at the infusion site Pain, tingling, or numbness in the hands or feet Unusual bruising or bleeding Side effects that usually do not require medical attention (report to your care team if they continue or are bothersome): Hair loss Nausea Vomiting This list may not describe all possible side effects. Call your doctor for medical advice about side effects. You may report side effects to FDA at 1-800-FDA-1088. Where should I keep my medication? This medication is given in a hospital or clinic. It will not be stored at home. NOTE: This sheet is a summary. It may not cover all possible information. If you have questions about this medicine, talk  to your doctor, pharmacist, or health care provider.  2023 Elsevier/Gold Standard (2021-12-13 00:00:00)     BELOW ARE SYMPTOMS THAT SHOULD BE REPORTED IMMEDIATELY: *FEVER GREATER THAN 100.4 F (38 C) OR HIGHER *CHILLS OR SWEATING *NAUSEA AND VOMITING THAT IS NOT CONTROLLED WITH YOUR NAUSEA MEDICATION *UNUSUAL SHORTNESS OF BREATH *UNUSUAL BRUISING OR BLEEDING *URINARY PROBLEMS (pain or burning when urinating, or frequent urination) *BOWEL PROBLEMS (unusual diarrhea, constipation, pain near the anus) TENDERNESS IN MOUTH AND THROAT WITH OR WITHOUT PRESENCE OF ULCERS (sore throat, sores in mouth, or a toothache) UNUSUAL RASH, SWELLING OR PAIN  UNUSUAL VAGINAL DISCHARGE OR ITCHING   Items with * indicate a potential emergency and should be followed up as soon as possible  or go to the Emergency Department if any problems should occur.  Please show the CHEMOTHERAPY ALERT CARD or IMMUNOTHERAPY ALERT CARD at check-in to the Emergency Department and triage nurse.  Should you have questions after your visit or need to cancel or reschedule your appointment, please contact [facility] [phone]  and follow the prompts.  Office hours are 8:00 a.m. to 4:30 p.m. Monday - Friday. Please note that voicemails left after 4:00 p.m. may not be returned until the following business day.  We are closed weekends and major holidays. You have access to a nurse at all times for urgent questions. Please call the main number to the clinic [phone] and follow the prompts.  For any non-urgent questions, you may also contact your provider using MyChart. We now offer e-Visits for anyone 27 and older to request care online for non-urgent symptoms. For details visit [email or web address].   Also download the MyChart app! Go to the app store, search "MyChart", open the app, select [facility], and log in with your MyChart username and password.

## 2022-10-02 NOTE — Progress Notes (Signed)
Patients port flushed without difficulty.  Good blood return noted with no bruising or swelling noted at site.  Stable during access and blood draw.  Patient to remain accessed for treatment. 

## 2022-10-02 NOTE — Progress Notes (Signed)
[facility]  Initial Assessment   Kristin Carson is a 52 y.o. year old female accompanied by patient and husband. Clinical Social Work was referred by medical provider for assessment of psychosocial needs.   SDOH (Social Determinants of Health) assessments performed: Yes   SDOH Screenings   Food Insecurity: No Food Insecurity (09/22/2022)  Housing: Low Risk  (09/22/2022)  Transportation Needs: No Transportation Needs (09/22/2022)  Recent Concern: Transportation Needs - Unmet Transportation Needs (09/09/2022)  Utilities: Not At Risk (09/22/2022)  Alcohol Screen: Low Risk  (09/09/2022)  Depression (PHQ2-9): Low Risk  (09/22/2022)  Financial Resource Strain: Low Risk  (09/09/2022)  Physical Activity: Insufficiently Active (09/09/2022)  Social Connections: Socially Isolated (09/09/2022)  Stress: No Stress Concern Present (09/09/2022)  Tobacco Use: High Risk (09/30/2022)     Distress Screen completed: No     No data to display            Family/Social Information:  Housing Arrangement: patient lives with her husband Kristin Carson) as well as a room mate who assists w/ the home bills. Family members/support persons in your life? Pt states she has 1 daughter and 4 stepchildren as well as a number of aunts and cousins who reside locally and will provide assistance at home if needed.   Transportation concerns: not at this time  Employment: Unemployed pt states she has never worked.  According to pt she once applied for SSI, but was denied.  Pt's spouse is also unemployed and receives disability  Income source: supported by room mate and husband's disability Financial concerns: Yes, current concerns Type of concern: Utilities and Rent/ mortgage Food access concerns: no Religious or spiritual practice: Not known Services Currently in place:  none  Coping/ Adjustment to diagnosis: Patient understands treatment plan and what happens next? 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: pt's priority is too engage in treatment w/ the hope of positive results Patient enjoys time with family/ friends Current coping skills/ strengths: Motivation for treatment/growth , Physical Health , and Supportive family/friends     SUMMARY: Current SDOH Barriers:  Financial constraints related to fixed income  Clinical Social Work Clinical Goal(s):  Explore community resource options for unmet needs related to:  Financial Strain   Interventions: Discussed common feeling and emotions when being diagnosed with cancer, and the importance of support during treatment Informed patient of the support team roles and support services at [facility] Provided [facility] contact information and encouraged patient to call with any questions or concerns Referred patient to Duanne [facility] and explained the [employer] application process.   CSW also encouraged pt to pursue SSI again as it has been a couple of years since she has applied.   Follow Up Plan: Patient will contact CSW with any support or resource needs Patient verbalizes understanding of plan: Yes    Henriette Combs, LCSW   Patient is participating in a Managed Medicaid Plan:  Yes

## 2022-10-03 ENCOUNTER — Other Ambulatory Visit: Payer: Self-pay

## 2022-10-03 ENCOUNTER — Other Ambulatory Visit (HOSPITAL_COMMUNITY): Payer: Medicaid Other

## 2022-10-03 NOTE — Progress Notes (Signed)
24 hour call back - Patient states that she feels good and has had no side effects at all.

## 2022-10-06 ENCOUNTER — Telehealth: Payer: Self-pay | Admitting: *Deleted

## 2022-10-06 NOTE — Telephone Encounter (Signed)
Patient called to advise that she was having "heart burn".  When asked, she appeared to be SOB and said the pain is in her chest.  Last treatment 10/02/22.  She denied any other symptoms, however since she is undergoing chemotherapy she was advised to go to the ER.  Verbalized understanding.

## 2022-10-08 NOTE — Progress Notes (Signed)
GYNECOLOGIC ONCOLOGY NEW PATIENT CONSULTATION   Patient Name: Kristin Carson  Patient Age: 52 y.o. Date of Service: 10/10/22 Referring Provider: Janyth Pupa, [place],  [place] [address]   Primary Care Provider: Pcp, No Consulting Provider: Jeral Pinch, MD   Assessment/Plan:  Pre vs peri-menopausal patient with stage IVB SCC of the cervix.   I reviewed with the patient her workup including biopsies and imaging.  Biopsies have confirmed a squamous cell carcinoma of the cervix.  We discussed cervical cancer diagnosis and the association with HPV.  We also discussed that tobacco use is a cofactor for the development of HPV related cervical dysplasia and malignancy.  Patient's preference was not to look at imaging, but we read through the report together and discussed sites of metastatic disease.  In the setting of stage IVB disease, I discussed recommendation to begin with systemic therapy.  She received cycle 1 last week with cisplatin, paclitaxel, and bevacizumab.  Given PD-L1 testing results now available, I recommend addition of pembrolizumab for additional cycles.  I would recommend repeat imaging after 3-4 cycles to assess treatment response.    Bleeding has already significantly improved.  There may be a role for localized radiation, such as radiation to the pelvis depending on disease response, or palliative radiation if bleeding does not stop on treatment.   The patient is scheduled for chest CT scan next week as well as a bone scan completion of her imaging/staging.  Patient endorses some symptoms of heartburn.  I sent in a prescription for Tums to her pharmacy.  If this is insufficient for her symptoms, discussed that we could start H2 blocker or PPI.  She has a history of pulmonary disease in the setting of long tobacco use.  She has insurance again, I sent a new prescription in so that she can get an albuterol inhaler.  Patient was commended on decreasing  her tobacco use significantly.  I gave her continued encouragement for further tobacco cessation.  The patient does not have a primary care provider.  I asked my nurse navigator, Santiago Glad, to help facilitate referral to establish with a primary care provider in [place] closer to home.  Patient signed release of record today so that we can obtain more recent Pap smear records from [facility] in [place].  I will plan to see the patient back in 3 - 4 months.  A copy of this note was sent to the patient's referring provider.   70 minutes of total time was spent for this patient encounter, including preparation, face-to-face counseling with the patient and coordination of care, and documentation of the encounter.  Jeral Pinch, MD  Division of Gynecologic Oncology  Department of Obstetrics and Gynecology  [facility]  ___________________________________________  Chief Complaint: No chief complaint on file.   History of Present Illness:  Kristin Carson is a 52 y.o. y.o. female who is seen in consultation at the request of Janyth Pupa, DO for an evaluation of metastatic cervical cancer.  The patient was hospitalized in December after presenting with diarrhea and nausea for 3 to 4 days as well as postcoital bleeding and abdominal pain.  In the emergency department, she was found to be mildly hypotensive and tachycardic with a leukocytosis.  She tested positive for COVID.  CT A/P on 08/30/22 revealed pulmonary nodules in the base measuring up to 12 mm.  Segment 4 hepatic hypodensity neutrophils from ligament measures 12 mm.  Mild prominence  of the right ureter and collecting system to the level of the pelvic inlet is noted where there is partially loculated metastatic fluid collection.  Asymmetric wall thickening of the superior posterior urinary bladder wall measuring 11 mm in thickness with loss of fat plane between the bladder and lower uterine segment/cervix.   Appendix is prominent with some adjacent stranding measuring up to 8 mm in thickness although this is favored to be reactive given peritoneal carcinomatosis.  The wall thickening with loss of fat plane between the lower uterine segment/cervix and the rectum.  Additionally, there is a loop of small bowel which transverses in close approximation with the lower uterine segment/cervix, with possible fistulous connection.  Multiple enlarged lymph nodes including left para-aortic, aortocaval, left external iliac, right common iliac.  These lymph nodes measure between 11 and 14 mm.  There is itself is heterogenous with irregularity of the endometrium extending through the cervix.  Foci of gas noted in the cervix.  Multiple omental and peritoneal areas of metastatic disease.  Small volume abdominal pelvic ascites with mesenteric edema noted.  Patient was seen by Dr. Nelda Marseille on 1/9 for follow-up of her ED visit.  He reported regular monthly menses with onset of daily bleeding approximately 6 months.  Endometrial biopsy on 09/09/2022 revealed invasive moderately to poorly differentiated squamous cell carcinoma with abundant necrosis, background extensive severe squamous dysplasia. Cervical biopsy revealed invasive and in situ moderate to poorly differentiated SCC. 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 PDL1 CPS 8%.  The patient was initially scheduled to see me on 1/19. Unfortunately, due to being in jail at that time of her appointment, her appointment was rescheduled. The patient established care with Dr. Delton Coombes on 2/1.  She had her port placed on 1/30.  She received her first cycle of cisplatin, paclitaxel, and bevacizumab on 10/02/2022.  The patient reports feeling very fatigued last week after chemotherapy.  She is been feeling better the last couple of days.  She denies any nausea or emesis.  She was having constipation, this has improved some now without the use of any medications.  She denies any  urinary symptoms.  She has noted some vaginal discharge for the last couple of days.  In discussing her menses, she notes that about a year ago, she began having increased bleeding with her menstrual cycles.  She also developed intermenstrual bleeding and postcoital bleeding.  She would have significantly more abdominal pain with her menses.  She also developed a foul-smelling vaginal discharge.  Since starting treatment, her bleeding has decreased significantly.  She denies any dizziness.  She has a longstanding history of tobacco use.  Was smoking 1 pack/day, has decreased to 5 cigarettes/day.  She denies shortness of breath at rest but has some shortness of breath with ambulation.  She was previously on albuterol although could not afford this at the time because she did not have insurance.  Is not currently using any medications for her pulmonary disease.  She has a longstanding history of tobacco use.  Has recently been able to cut back from smoking a pack a day to 5 cigarettes a day.  PAST MEDICAL HISTORY:  Past Medical History:  Diagnosis Date   Anemia    Asthma    Bronchitis    COPD (chronic obstructive pulmonary disease) (HCC)    Depression    GERD (gastroesophageal reflux disease)    Headache    Hypertension    Port-A-Cath in place 09/25/2022  PAST SURGICAL HISTORY:  Past Surgical History:  Procedure Laterality Date   BREAST SURGERY     CESAREAN SECTION     IR IMAGING GUIDED PORT INSERTION  09/30/2022   LEG SURGERY      OB/GYN HISTORY:  OB History  Gravida Para Term Preterm AB Living  1 1 1     1  $ SAB IAB Ectopic Multiple Live Births               # Outcome Date GA Lbr Len/2nd Weight Sex Delivery Anes PTL Lv  1 Term      CS-LTranv       No LMP recorded.  Age at menarche: 52 Age at menopause: Not applicable Hx of HRT: Not applicable Hx of STDs: HPV Last pap: see HPI History of abnormal pap smears: denies prior to recent pap.  The patient thinks her last Pap  smear was at the [facility] [place] 3-4 years ago.  SCREENING STUDIES:  Last mammogram: n/a  Last colonoscopy: n/a  MEDICATIONS: Outpatient Encounter Medications as of 10/10/2022  Medication Sig   albuterol (VENTOLIN HFA) 108 (90 Base) MCG/ACT inhaler Inhale 2 puffs into the lungs every 6 (six) hours as needed for wheezing or shortness of breath.   calcium carbonate (TUMS) 500 MG chewable tablet Chew 1 tablet (200 mg of elemental calcium total) by mouth 3 (three) times daily with meals.   amLODipine (NORVASC) 10 MG tablet Take 1 tablet (10 mg total) by mouth daily.   Atezolizumab (TECENTRIQ IV) Inject into the vein every 21 ( twenty-one) days.   Bevacizumab (AVASTIN IV) Inject into the vein every 21 ( twenty-one) days.   CISPLATIN IV Inject into the vein every 21 ( twenty-one) days.   dexamethasone (DECADRON) 4 MG tablet Take 2 tablets daily x 3 days starting the day after cisplatin chemotherapy. Take with food.   HYDROcodone-acetaminophen (NORCO/VICODIN) 5-325 MG tablet Take 1 tablet by mouth every 12 (twelve) hours as needed for moderate pain.   lidocaine-prilocaine (EMLA) cream Apply a dime-sized amount to port a cath site and cover with plastic wrap 1 hour prior to infusion appointments   ondansetron (ZOFRAN) 8 MG tablet Take 1 tablet (8 mg total) by mouth every 8 (eight) hours as needed for nausea or vomiting. Start on the third day after cisplatin.   PACLITAXEL IV Inject into the vein every 21 ( twenty-one) days.   prochlorperazine (COMPAZINE) 10 MG tablet Take 1 tablet (10 mg total) by mouth every 6 (six) hours as needed (Nausea or vomiting).   [DISCONTINUED] famotidine (PEPCID) 20 MG tablet Take 1 tablet (20 mg total) by mouth 2 (two) times daily. (Patient not taking: Reported on 01/12/2018)   [DISCONTINUED] hydrochlorothiazide (HYDRODIURIL) 25 MG tablet Take 1 tablet (25 mg total) by mouth daily.   No facility-administered encounter medications on file as of 10/10/2022.     ALLERGIES:  Allergies  Allergen Reactions   Carrot [Daucus Carota] Hives   Lisinopril-Hydrochlorothiazide     Oral swelling   Ibuprofen Other (See Comments)    Oral swelling   Tramadol Hives   Erythromycin Hives     FAMILY HISTORY:  Family History  Problem Relation Age of Onset   Dermatomyositis Mother    Hypertension Mother    Cancer Mother        lung   Cancer Father        lung   Heart disease Father    Lung cancer Paternal Uncle    Lung cancer  Maternal Uncle    Colon cancer Neg Hx    Breast cancer Neg Hx    Ovarian cancer Neg Hx    Endometrial cancer Neg Hx    Pancreatic cancer Neg Hx    Prostate cancer Neg Hx      SOCIAL HISTORY:  Social Connections: Socially Isolated (09/09/2022)   Social Connection and Isolation Panel [NHANES]    Frequency of Communication with Friends and Family: Three times a week    Frequency of Social Gatherings with Friends and Family: More than three times a week    Attends Religious Services: Never    Marine scientist or Organizations: No    Attends Archivist Meetings: Never    Marital Status: Separated    REVIEW OF SYSTEMS:  Pertinent positives as per HPI. Denies appetite changes, fevers, chills, unexplained weight changes. Denies hearing loss, neck lumps or masses, mouth sores, ringing in ears or voice changes. Denies cough or wheezing. Denies chest pain or palpitations. Denies leg swelling. Denies abdominal distention, pain, blood in stools, constipation, diarrhea, nausea, vomiting, or early satiety. Denies pain with intercourse, dysuria, frequency, hematuria or incontinence. Denies hot flashes, pelvic pain.   Denies joint pain, back pain or muscle pain/cramps. Denies itching, rash, or wounds. Denies dizziness, headaches, numbness or seizures. Denies swollen lymph nodes or glands, denies easy bruising or bleeding. Denies anxiety, depression, confusion, or decreased concentration.  Physical Exam:  Vital  Signs for this encounter:  Blood pressure 131/77, pulse 85, temperature 97.8 F (36.6 C), temperature source Oral, resp. rate 18, height 5' 4"$  (1.626 m), weight 184 lb (83.5 kg), SpO2 100 %. Body mass index is 31.58 kg/m. General: Alert, oriented, no acute distress.  HEENT: Normocephalic, atraumatic. Sclera anicteric.  Chest: Clear to auscultation bilaterally. No wheezes, rhonchi, or rales. Cardiovascular: Regular rate and rhythm, no murmurs, rubs, or gallops.  Abdomen: Normoactive bowel sounds. Soft, nondistended, nontender to palpation. No masses or hepatosplenomegaly appreciated. No palpable fluid wave.  Extremities: Grossly normal range of motion. Warm, well perfused. No edema bilaterally.  Skin: No rashes or lesions.  Lymphatics: No cervical, supraclavicular, or inguinal adenopathy.  GU:  Normal external female genitalia. No lesions. No discharge or bleeding.             Bladder/urethra:  No lesions or masses, well supported bladder             Vagina: Well-rugated.  No lesions along the vaginal wall.             Cervix: Replaced by a vesicular/frondular looking tumor, no visible extension to the upper vagina.  On bimanual exam, cervix is approximately 3-4 cm and replaced by firm tumor.             Uterus: Minimally mobile, + parametrial involvement bilaterally although not to the sidewall.             Adnexa: No masses appreciated.  Rectal: Poorly tolerated.  No obvious invasion posteriorly of the rectum although I could not palpate up to the superior aspect of the cervix.  LABORATORY AND RADIOLOGIC DATA:  Outside medical records were reviewed to synthesize the above history, along with the history and physical obtained during the visit.   Lab Results  Component Value Date   WBC 8.7 10/02/2022   HGB 8.7 (L) 10/02/2022   HCT 27.1 (L) 10/02/2022   PLT 368 10/02/2022   GLUCOSE 102 (H) 10/02/2022   CHOL 155 11/25/2016   TRIG 63 11/25/2016  HDL 55 11/25/2016   LDLCALC 87  11/25/2016   ALT 8 10/02/2022   AST 20 10/02/2022   NA 132 (L) 10/02/2022   K 4.0 10/02/2022   CL 99 10/02/2022   CREATININE 1.00 10/02/2022   BUN 8 10/02/2022   CO2 24 10/02/2022   TSH 3.375 08/29/2022   INR 1.0 08/29/2022   HGBA1C 5.3 08/29/2022

## 2022-10-10 ENCOUNTER — Inpatient Hospital Stay (HOSPITAL_BASED_OUTPATIENT_CLINIC_OR_DEPARTMENT_OTHER): Payer: Medicaid Other | Admitting: Gynecologic Oncology

## 2022-10-10 ENCOUNTER — Telehealth: Payer: Self-pay | Admitting: Oncology

## 2022-10-10 ENCOUNTER — Other Ambulatory Visit: Payer: Self-pay

## 2022-10-10 ENCOUNTER — Encounter: Payer: Self-pay | Admitting: Gynecologic Oncology

## 2022-10-10 VITALS — BP 131/77 | HR 85 | Temp 97.8°F | Resp 18 | Ht 64.0 in | Wt 184.0 lb

## 2022-10-10 DIAGNOSIS — K219 Gastro-esophageal reflux disease without esophagitis: Secondary | ICD-10-CM | POA: Diagnosis not present

## 2022-10-10 DIAGNOSIS — R0602 Shortness of breath: Secondary | ICD-10-CM

## 2022-10-10 DIAGNOSIS — F17219 Nicotine dependence, cigarettes, with unspecified nicotine-induced disorders: Secondary | ICD-10-CM

## 2022-10-10 DIAGNOSIS — C538 Malignant neoplasm of overlapping sites of cervix uteri: Secondary | ICD-10-CM

## 2022-10-10 DIAGNOSIS — J449 Chronic obstructive pulmonary disease, unspecified: Secondary | ICD-10-CM | POA: Diagnosis not present

## 2022-10-10 DIAGNOSIS — N939 Abnormal uterine and vaginal bleeding, unspecified: Secondary | ICD-10-CM

## 2022-10-10 MED ORDER — ALBUTEROL SULFATE HFA 108 (90 BASE) MCG/ACT IN AERS: 2.0000 | INHALATION_SPRAY | Freq: Four times a day (QID) | RESPIRATORY_TRACT | 2 refills | Status: DC | PRN

## 2022-10-10 MED ORDER — CALCIUM CARBONATE ANTACID 500 MG PO CHEW: 1.0000 | CHEWABLE_TABLET | Freq: Three times a day (TID) | ORAL | 3 refills | Status: DC

## 2022-10-10 NOTE — Telephone Encounter (Signed)
[facility] and left a message with referral.  Requested a return call.

## 2022-10-10 NOTE — Telephone Encounter (Signed)
Called the [facility] and requested a return call regarding medical records.

## 2022-10-10 NOTE — Telephone Encounter (Signed)
Appointment scheduled with [facility].  Called Virgie and advised her of appointment on 10/24/22 at 2:40 and provided her with the appointment details.  She verbalized understanding and agreement.

## 2022-10-11 ENCOUNTER — Other Ambulatory Visit: Payer: Self-pay

## 2022-10-11 NOTE — Progress Notes (Unsigned)
[facility], [place] [address] Phone: [phone] Fax: [place] PROGRESS NOTE   Kristin Carson [id number] Aug 20, 1971 52 y.o.  Kristin Carson is managed by Dr. Delton Coombes for metastatic cervical squamous cell carcinoma  Actively treated with chemotherapy/immunotherapy/hormonal therapy: YES  Current therapy: Cisplatin + paclitaxel + bevacizumab  Last treated: 10/02/2022  INTERVAL HISTORY:  Chief Complaint: Chemotherapy follow-up & Symptom management  Kristin Carson received cycle #1 of cisplatin/paclitaxel/bevacizumab for treatment of her metastatic cervical squamous cell carcinoma on 10/02/2022.  *** Vaginal bleeding *** Suprapubic pain radiating to lower back and left leg *** Abdominal pain (taking hydrocodone 5/325 every 12 hours as needed)  *** Nausea/vomiting/diarrhea *** Mouth sores *** Fatigue *** Appetite *** Food intake *** Liquid intake She  has *** energy and *** appetite.  Her  weight has been ***.  *** Changes in urine *** Rash or skin changes  *** Swelling/fluid buildup *** Chest pain, SOB, DOE  *** Neuropathy *** New pain (joint pain, headaches, leg pain)  *** Bruising or bleeding *** B symptoms  ASSESSMENT & PLAN:  # Metastatic cervical cancer (squamous cell carcinoma) to lungs, peritoneum, and liver - Primary medical oncologist is Dr. Delton Coombes.  Patient also follows with Dr. Berline Lopes (GYN oncology) *** - Presenting symptoms of vaginal bleeding, lower abdominal pain, and back pain x 9 months *** - Day 1/cycle 1 of cisplatin + paclitaxel + bevacizumab on 10/02/2022 - *** - PLAN: Scheduled for follow-up with Dr. Delton Coombes on 10/23/2022  # Normocytic anemia - Iron deficiency likely secondary to vaginal bleeding in the setting of cervical cancer  - labs from 09/22/2022 showed ferritin 35 and iron saturation 7% - Bleeding *** - CBC today *** - PLAN: Scheduled for [facility] today and  next week  # Abdominal pain - Hydrocodone 5/325 every 12 hours as needed *** - PLAN: ***   # *** - *** - PLAN: ***    # *** - *** - PLAN: ***    # *** - *** - PLAN: ***    # *** - *** - PLAN: ***    # *** - *** - PLAN: ***     PLAN SUMMARY: >> *** >> *** >> *** >> Next scheduled appointment with main oncologist: ***   REVIEW OF SYSTEMS: ***  Review of Systems  Past Medical History, Surgical history, Social history, and Family history were reviewed as documented elsewhere in chart, and were updated as appropriate.   OBJECTIVE:  Physical Exam: *** There were no vitals taken for this visit. ECOG: ***  Physical Exam  Lab Review:     Component Value Date/Time   NA 132 (L) 10/02/2022 0838   K 4.0 10/02/2022 0838   CL 99 10/02/2022 0838   CO2 24 10/02/2022 0838   GLUCOSE 102 (H) 10/02/2022 0838   BUN 8 10/02/2022 0838   CREATININE 1.00 10/02/2022 0838   CREATININE 1.25 (H) 11/25/2016 1053   CALCIUM 8.4 (L) 10/02/2022 0838   PROT 7.5 10/02/2022 0838   ALBUMIN 3.1 (L) 10/02/2022 0838   AST 20 10/02/2022 0838   ALT 8 10/02/2022 0838   ALKPHOS 63 10/02/2022 0838   BILITOT 0.2 (L) 10/02/2022 0838   GFRNONAA >60 10/02/2022 0838   GFRAA >60 01/12/2018 1247       Component Value Date/Time   WBC 8.7 10/02/2022 0838   RBC 3.41 (L) 10/02/2022 0838   HGB 8.7 (L) 10/02/2022 0838   HCT  27.1 (L) 10/02/2022 0838   PLT 368 10/02/2022 0838   MCV 79.5 (L) 10/02/2022 0838   MCH 25.5 (L) 10/02/2022 0838   MCHC 32.1 10/02/2022 0838   RDW 17.8 (H) 10/02/2022 0838   LYMPHSABS 2.4 10/02/2022 0838   MONOABS 0.8 10/02/2022 0838   EOSABS 0.2 10/02/2022 0838   BASOSABS 0.0 10/02/2022 0838   -------------------------------  Imaging from last 24 hours (if applicable): Radiology interpretation: IR IMAGING GUIDED PORT INSERTION  Result Date: 09/30/2022 INDICATION: 53 year old female referred for port catheter placement EXAM: IMAGE GUIDED PORT CATHETER MEDICATIONS:  None ANESTHESIA/SEDATION: Moderate (conscious) sedation was employed during this procedure. A total of Versed 2.0 mg and Fentanyl 100 mcg was administered intravenously. Moderate Sedation Time: 26 minutes. The patient's level of consciousness and vital signs were monitored continuously by radiology nursing throughout the procedure under my direct supervision. FLUOROSCOPY TIME:  Fluoroscopy Time: 0 minutes 6 seconds (1 mGy). COMPLICATIONS: None PROCEDURE: Informed written consent was obtained from the patient after a thorough discussion of the procedural risks, benefits and alternatives. All questions were addressed. Maximal Sterile Barrier Technique was utilized including caps, mask, sterile gowns, sterile gloves, sterile drape, hand hygiene and skin antiseptic. A timeout was performed prior to the initiation of the procedure. Ultrasound survey was performed with images stored and sent to PACs. Right IJ vein documented to be patent. The right neck and chest was prepped with chlorhexidine, and draped in the usual sterile fashion using maximum barrier technique (cap and mask, sterile gown, sterile gloves, large sterile sheet, hand hygiene and cutaneous antiseptic). Local anesthesia was attained by infiltration with 1% lidocaine without epinephrine. Ultrasound demonstrated patency of the right internal jugular vein, and this was documented with an image. Under real-time ultrasound guidance, this vein was accessed with a 21 gauge micropuncture needle and image documentation was performed. A small dermatotomy was made at the access site with an 11 scalpel. A 0.018" wire was advanced into the SVC and used to estimate the length of the internal catheter. The access needle exchanged for a 47F micropuncture vascular sheath. The 0.018" wire was then removed and a 0.035" wire advanced into the IVC. An appropriate location for the subcutaneous reservoir was selected below the clavicle and an incision was made through the skin  and underlying soft tissues. The subcutaneous tissues were then dissected using a combination of blunt and sharp surgical technique and a pocket was formed. A single lumen power injectable portacatheter was then tunneled through the subcutaneous tissues from the pocket to the dermatotomy and the port reservoir placed within the subcutaneous pocket. The venous access site was then serially dilated and a peel away vascular sheath placed over the wire. The wire was removed and the port catheter advanced into position under fluoroscopic guidance. The catheter tip is positioned in the cavoatrial junction. This was documented with a spot image. The portacatheter was then tested and found to flush and aspirate well. The port was flushed with saline followed by 100 units/mL heparinized saline. The pocket was then closed in two layers using first subdermal inverted interrupted absorbable sutures followed by a running subcuticular suture. The epidermis was then sealed with Dermabond. Steri-Strips were placed. The dermatotomy at the venous access site was also seal with Dermabond. Patient tolerated the procedure well and remained hemodynamically stable throughout. No complications encountered and no significant blood loss encountered IMPRESSION: Status post right IJ port catheter. Signed, Dulcy Fanny. Nadene Rubins, RPVI [facility] Radiology Electronically Signed  By: Corrie Mckusick D.O.   On: 09/30/2022 15:13      WRAP UP:  All questions were answered. The patient knows to call the clinic with any problems, questions or concerns.  Medical decision making: ***  Time spent on visit: I spent {CHL ONC TIME VISIT - [id number] counseling the patient face to face. The total time spent in the appointment was {CHL ONC TIME VISIT - [id number] and more than 50% was on counseling.  Harriett Rush, PA-C  ***

## 2022-10-13 ENCOUNTER — Inpatient Hospital Stay (HOSPITAL_BASED_OUTPATIENT_CLINIC_OR_DEPARTMENT_OTHER): Payer: Medicaid Other | Admitting: Physician Assistant

## 2022-10-13 ENCOUNTER — Inpatient Hospital Stay: Payer: Medicaid Other

## 2022-10-13 VITALS — BP 146/84 | HR 95 | Temp 97.8°F | Resp 18 | Wt 181.0 lb

## 2022-10-13 DIAGNOSIS — C7802 Secondary malignant neoplasm of left lung: Secondary | ICD-10-CM | POA: Diagnosis not present

## 2022-10-13 DIAGNOSIS — Z09 Encounter for follow-up examination after completed treatment for conditions other than malignant neoplasm: Secondary | ICD-10-CM

## 2022-10-13 DIAGNOSIS — C787 Secondary malignant neoplasm of liver and intrahepatic bile duct: Secondary | ICD-10-CM | POA: Diagnosis not present

## 2022-10-13 DIAGNOSIS — F17210 Nicotine dependence, cigarettes, uncomplicated: Secondary | ICD-10-CM | POA: Diagnosis not present

## 2022-10-13 DIAGNOSIS — C538 Malignant neoplasm of overlapping sites of cervix uteri: Secondary | ICD-10-CM

## 2022-10-13 DIAGNOSIS — E860 Dehydration: Secondary | ICD-10-CM | POA: Diagnosis not present

## 2022-10-13 DIAGNOSIS — D509 Iron deficiency anemia, unspecified: Secondary | ICD-10-CM | POA: Diagnosis not present

## 2022-10-13 DIAGNOSIS — D500 Iron deficiency anemia secondary to blood loss (chronic): Secondary | ICD-10-CM

## 2022-10-13 DIAGNOSIS — Z5112 Encounter for antineoplastic immunotherapy: Secondary | ICD-10-CM | POA: Diagnosis not present

## 2022-10-13 DIAGNOSIS — G893 Neoplasm related pain (acute) (chronic): Secondary | ICD-10-CM

## 2022-10-13 DIAGNOSIS — R109 Unspecified abdominal pain: Secondary | ICD-10-CM | POA: Diagnosis not present

## 2022-10-13 DIAGNOSIS — C7801 Secondary malignant neoplasm of right lung: Secondary | ICD-10-CM | POA: Diagnosis not present

## 2022-10-13 DIAGNOSIS — Z79899 Other long term (current) drug therapy: Secondary | ICD-10-CM | POA: Diagnosis not present

## 2022-10-13 DIAGNOSIS — C786 Secondary malignant neoplasm of retroperitoneum and peritoneum: Secondary | ICD-10-CM | POA: Diagnosis not present

## 2022-10-13 DIAGNOSIS — Z5111 Encounter for antineoplastic chemotherapy: Secondary | ICD-10-CM | POA: Diagnosis not present

## 2022-10-13 DIAGNOSIS — C539 Malignant neoplasm of cervix uteri, unspecified: Secondary | ICD-10-CM | POA: Diagnosis not present

## 2022-10-13 LAB — COMPREHENSIVE METABOLIC PANEL
ALT: 14 U/L (ref 0–44)
AST: 21 U/L (ref 15–41)
Albumin: 3.3 g/dL — ABNORMAL LOW (ref 3.5–5.0)
Alkaline Phosphatase: 59 U/L (ref 38–126)
Anion gap: 9 (ref 5–15)
BUN: 8 mg/dL (ref 6–20)
CO2: 22 mmol/L (ref 22–32)
Calcium: 8.2 mg/dL — ABNORMAL LOW (ref 8.9–10.3)
Chloride: 104 mmol/L (ref 98–111)
Creatinine, Ser: 0.94 mg/dL (ref 0.44–1.00)
GFR, Estimated: >60 mL/min (ref >60)
Glucose, Bld: 85 mg/dL (ref 70–99)
Potassium: 3.9 mmol/L (ref 3.5–5.1)
Sodium: 135 mmol/L (ref 135–145)
Total Bilirubin: 0.3 mg/dL (ref 0.3–1.2)
Total Protein: 7 g/dL (ref 6.5–8.1)

## 2022-10-13 LAB — CBC WITH DIFFERENTIAL/PLATELET
Abs Immature Granulocytes: 0.03 10*3/uL (ref 0.00–0.07)
Basophils Absolute: 0 10*3/uL (ref 0.0–0.1)
Basophils Relative: 0 %
Eosinophils Absolute: 0 10*3/uL (ref 0.0–0.5)
Eosinophils Relative: 0 %
HCT: 28.7 % — ABNORMAL LOW (ref 36.0–46.0)
Hemoglobin: 9.2 g/dL — ABNORMAL LOW (ref 12.0–15.0)
Immature Granulocytes: 0 %
Lymphocytes Relative: 24 %
Lymphs Abs: 1.6 10*3/uL (ref 0.7–4.0)
MCH: 24.9 pg — ABNORMAL LOW (ref 26.0–34.0)
MCHC: 32.1 g/dL (ref 30.0–36.0)
MCV: 77.8 fL — ABNORMAL LOW (ref 80.0–100.0)
Monocytes Absolute: 0.7 10*3/uL (ref 0.1–1.0)
Monocytes Relative: 10 %
Neutro Abs: 4.5 10*3/uL (ref 1.7–7.7)
Neutrophils Relative %: 66 %
Platelets: 469 10*3/uL — ABNORMAL HIGH (ref 150–400)
RBC: 3.69 MIL/uL — ABNORMAL LOW (ref 3.87–5.11)
RDW: 17.8 % — ABNORMAL HIGH (ref 11.5–15.5)
WBC: 6.8 10*3/uL (ref 4.0–10.5)
nRBC: 0 % (ref 0.0–0.2)

## 2022-10-13 LAB — MAGNESIUM: Magnesium: 2 mg/dL (ref 1.7–2.4)

## 2022-10-13 MED ORDER — SODIUM CHLORIDE 0.9 % IV SOLN: Freq: Once | INTRAVENOUS | Status: AC

## 2022-10-13 MED ORDER — PANTOPRAZOLE SODIUM 40 MG PO TBEC: 40.0000 mg | DELAYED_RELEASE_TABLET | Freq: Every day | ORAL | 1 refills | Status: DC

## 2022-10-13 MED ORDER — FAMOTIDINE IN NACL 20-0.9 MG/50ML-% IV SOLN
20.0000 mg | Freq: Once | INTRAVENOUS | Status: AC
  Administered 2022-10-13: 20 mg via INTRAVENOUS
  Filled 2022-10-13: qty 50

## 2022-10-13 MED ORDER — SODIUM CHLORIDE 0.9 % IV SOLN
510.0000 mg | Freq: Once | INTRAVENOUS | Status: AC
  Administered 2022-10-13: 510 mg via INTRAVENOUS
  Filled 2022-10-13: qty 510

## 2022-10-13 MED ORDER — ACETAMINOPHEN 325 MG PO TABS
650.0000 mg | ORAL_TABLET | Freq: Once | ORAL | Status: AC
  Administered 2022-10-13: 650 mg via ORAL
  Filled 2022-10-13: qty 2

## 2022-10-13 MED ORDER — SODIUM CHLORIDE 0.9% FLUSH
10.0000 mL | Freq: Once | INTRAVENOUS | Status: AC | PRN
  Administered 2022-10-13: 10 mL

## 2022-10-13 MED ORDER — HYDROCODONE-ACETAMINOPHEN 5-325 MG PO TABS: 1.0000 | ORAL_TABLET | Freq: Two times a day (BID) | ORAL | 0 refills | Status: DC | PRN

## 2022-10-13 MED ORDER — HEPARIN SOD (PORK) LOCK FLUSH 100 UNIT/ML IV SOLN
500.0000 [IU] | Freq: Once | INTRAVENOUS | Status: AC | PRN
  Administered 2022-10-13: 500 [IU]

## 2022-10-13 MED ORDER — SODIUM CHLORIDE 0.9 % IV SOLN: INTRAVENOUS | Status: DC

## 2022-10-13 MED ORDER — CETIRIZINE HCL 10 MG/ML IV SOLN
10.0000 mg | Freq: Once | INTRAVENOUS | Status: AC
  Administered 2022-10-13: 10 mg via INTRAVENOUS
  Filled 2022-10-13: qty 1

## 2022-10-13 MED ORDER — METHYLPREDNISOLONE SODIUM SUCC 125 MG IJ SOLR
125.0000 mg | Freq: Once | INTRAMUSCULAR | Status: AC
  Administered 2022-10-13: 125 mg via INTRAVENOUS
  Filled 2022-10-13: qty 2

## 2022-10-13 MED ORDER — SODIUM CHLORIDE 0.9% FLUSH
10.0000 mL | INTRAVENOUS | Status: DC | PRN
  Administered 2022-10-13: 10 mL via INTRAVENOUS

## 2022-10-13 NOTE — Patient Instructions (Signed)
[facility]  Discharge Instructions: Thank you for choosing [facility] to provide your oncology and hematology care.  If you have a lab appointment with the [facility], please come in thru the Main Entrance and check in at the main information desk.  Wear comfortable clothing and clothing appropriate for easy access to any Portacath or PICC line.   We strive to give you quality time with your provider. You may need to reschedule your appointment if you arrive late (15 or more minutes).  Arriving late affects you and other patients whose appointments are after yours.  Also, if you miss three or more appointments without notifying the office, you may be dismissed from the clinic at the provider's discretion.      For prescription refill requests, have your pharmacy contact our office and allow 72 hours for refills to be completed.    Today you received the following Feraheme, return as scheduled.   To help prevent nausea and vomiting after your treatment, we encourage you to take your nausea medication as directed.  BELOW ARE SYMPTOMS THAT SHOULD BE REPORTED IMMEDIATELY: *FEVER GREATER THAN 100.4 F (38 C) OR HIGHER *CHILLS OR SWEATING *NAUSEA AND VOMITING THAT IS NOT CONTROLLED WITH YOUR NAUSEA MEDICATION *UNUSUAL SHORTNESS OF BREATH *UNUSUAL BRUISING OR BLEEDING *URINARY PROBLEMS (pain or burning when urinating, or frequent urination) *BOWEL PROBLEMS (unusual diarrhea, constipation, pain near the anus) TENDERNESS IN MOUTH AND THROAT WITH OR WITHOUT PRESENCE OF ULCERS (sore throat, sores in mouth, or a toothache) UNUSUAL RASH, SWELLING OR PAIN  UNUSUAL VAGINAL DISCHARGE OR ITCHING   Items with * indicate a potential emergency and should be followed up as soon as possible or go to the Emergency Department if any problems should occur.  Please show the CHEMOTHERAPY ALERT CARD or IMMUNOTHERAPY ALERT CARD at check-in to the Emergency Department and  triage nurse.  Should you have questions after your visit or need to cancel or reschedule your appointment, please contact [facility] [phone]  and follow the prompts.  Office hours are 8:00 a.m. to 4:30 p.m. Monday - Friday. Please note that voicemails left after 4:00 p.m. may not be returned until the following business day.  We are closed weekends and major holidays. You have access to a nurse at all times for urgent questions. Please call the main number to the clinic [phone] and follow the prompts.  For any non-urgent questions, you may also contact your provider using MyChart. We now offer e-Visits for anyone 72 and older to request care online for non-urgent symptoms. For details visit [email or web address].   Also download the MyChart app! Go to the app store, search "MyChart", open the app, select [facility], and log in with your MyChart username and password.

## 2022-10-13 NOTE — Progress Notes (Signed)
Patient presents today for iron infusion, patient reports feeling a little lightheaded/dizzy when walking down the hall. Patient sat down in chair by tech and vital obtained, and stable. Patient reports not having anything to eat this morning. Tarri Abernethy, PA made aware, PA ordered 530m of normal saline over 1 hour.  Patient tolerated iron infusion and hydration therapy with no complaints voiced. Port site clean and dry with good blood return noted before and after infusion. Band aid applied. VSS with discharge and left in satisfactory condition with no s/s of distress noted.

## 2022-10-13 NOTE — Patient Instructions (Signed)
[facility] at [place] **   You were seen today by Tarri Abernethy PA-C for your chemotherapy follow-up visit and symptom management.    HEARTBURN: Prescription has been sent to your pharmacy for pantoprazole (Protonix) 40 mg.  Take this every morning about half an hour before breakfast.  DEHYDRATION: It is very important that you remain hydrated while you are receiving treatment!   Make sure that you are drinking at least 64 ounces of water each day.  You can drink juice and other beverages in addition to water, but should still drink plenty of water.  If you are unable to drink enough water, or are losing fluids through vomiting or diarrhea, please [place]. If you are severely dehydrated, you may have symptoms such as low blood pressure, increased weakness, lightheadedness with standing, passing out, or feeling that your heartbeat is racing.  If the symptoms occur, please [place].  If the symptoms are severe or occur outside of business hours, proceed to the emergency department.  NUTRITION & WEIGHT LOSS: Try to eat small frequent meals throughout the day.   Drink 1 Boost or Ensure daily.  ** Thank you for trusting me with your healthcare!  I strive to provide all of my patients with quality care at each visit.  If you receive a survey for this visit, I would be so grateful to you for taking the time to provide feedback.  Thank you in advance!  ~ Dierra Riesgo                   Dr. Derek Jack   &   Tarri Abernethy, PA-C   - - - - - - - - - - - - - - - - - -    Thank you for choosing [facility] at [facility] to provide your oncology and hematology care.  To afford each patient quality time with our provider, please arrive at least 15 minutes before your scheduled appointment time.   If you have a lab appointment with the [facility] please come in thru the Main  Entrance and check in at the main information desk.  You need to re-schedule your appointment should you arrive 10 or more minutes late.  We strive to give you quality time with our providers, and arriving late affects you and other patients whose appointments are after yours.  Also, if you no show three or more times for appointments you may be dismissed from the clinic at the providers discretion.     Again, thank you for choosing [facility].  Our hope is that these requests will decrease the amount of time that you wait before being seen by our physicians.       _____________________________________________________________  Should you have questions after your visit to [facility], please contact our office at [phone] and follow the prompts.  Our office hours are 8:00 a.m. and 4:30 p.m. Monday - Friday.  Please note that voicemails left after 4:00 p.m. may not be returned until the following business day.  We are closed weekends and major holidays.  You do have access to a nurse 24-7, just call the main number to the clinic [phone] and do not press any options, hold on the line and a nurse will answer the phone.    For prescription refill requests, have your pharmacy contact our office and  allow 72 hours.

## 2022-10-14 ENCOUNTER — Encounter (HOSPITAL_COMMUNITY)
Admission: RE | Admit: 2022-10-14 | Discharge: 2022-10-14 | Disposition: A | Discharge status: Home / Self Care | Payer: Medicaid Other | Source: Ambulatory Visit | Attending: Hematology | Admitting: Hematology

## 2022-10-14 ENCOUNTER — Encounter (HOSPITAL_COMMUNITY): Payer: Self-pay

## 2022-10-14 DIAGNOSIS — C538 Malignant neoplasm of overlapping sites of cervix uteri: Secondary | ICD-10-CM | POA: Diagnosis not present

## 2022-10-14 DIAGNOSIS — C760 Malignant neoplasm of head, face and neck: Secondary | ICD-10-CM | POA: Diagnosis not present

## 2022-10-14 DIAGNOSIS — C539 Malignant neoplasm of cervix uteri, unspecified: Secondary | ICD-10-CM | POA: Diagnosis not present

## 2022-10-14 HISTORY — DX: Heart failure, unspecified: I50.9

## 2022-10-14 HISTORY — DX: Malignant (primary) neoplasm, unspecified: C80.1

## 2022-10-14 MED ORDER — TECHNETIUM TC 99M MEDRONATE IV KIT
20.0000 | PACK | Freq: Once | INTRAVENOUS | Status: AC | PRN
  Administered 2022-10-14: 19.5 via INTRAVENOUS

## 2022-10-16 ENCOUNTER — Ambulatory Visit (HOSPITAL_COMMUNITY)
Admission: RE | Admit: 2022-10-16 | Discharge: 2022-10-16 | Disposition: A | Discharge status: Home / Self Care | Payer: Medicaid Other | Source: Ambulatory Visit | Attending: Hematology | Admitting: Hematology

## 2022-10-16 ENCOUNTER — Encounter (HOSPITAL_COMMUNITY): Payer: Self-pay

## 2022-10-16 DIAGNOSIS — C538 Malignant neoplasm of overlapping sites of cervix uteri: Secondary | ICD-10-CM | POA: Insufficient documentation

## 2022-10-16 MED ORDER — IOHEXOL 300 MG/ML  SOLN
75.0000 mL | Freq: Once | INTRAMUSCULAR | Status: AC | PRN
  Administered 2022-10-16: 75 mL via INTRAVENOUS

## 2022-10-20 ENCOUNTER — Inpatient Hospital Stay: Payer: Medicaid Other

## 2022-10-20 VITALS — BP 119/81 | HR 94 | Temp 98.7°F | Resp 20

## 2022-10-20 DIAGNOSIS — C538 Malignant neoplasm of overlapping sites of cervix uteri: Secondary | ICD-10-CM

## 2022-10-20 DIAGNOSIS — Z5112 Encounter for antineoplastic immunotherapy: Secondary | ICD-10-CM | POA: Diagnosis not present

## 2022-10-20 DIAGNOSIS — C786 Secondary malignant neoplasm of retroperitoneum and peritoneum: Secondary | ICD-10-CM | POA: Diagnosis not present

## 2022-10-20 DIAGNOSIS — F17210 Nicotine dependence, cigarettes, uncomplicated: Secondary | ICD-10-CM | POA: Diagnosis not present

## 2022-10-20 DIAGNOSIS — E860 Dehydration: Secondary | ICD-10-CM | POA: Diagnosis not present

## 2022-10-20 DIAGNOSIS — C7801 Secondary malignant neoplasm of right lung: Secondary | ICD-10-CM | POA: Diagnosis not present

## 2022-10-20 DIAGNOSIS — Z5111 Encounter for antineoplastic chemotherapy: Secondary | ICD-10-CM | POA: Diagnosis not present

## 2022-10-20 DIAGNOSIS — C539 Malignant neoplasm of cervix uteri, unspecified: Secondary | ICD-10-CM | POA: Diagnosis not present

## 2022-10-20 DIAGNOSIS — Z95828 Presence of other vascular implants and grafts: Secondary | ICD-10-CM

## 2022-10-20 DIAGNOSIS — Z79899 Other long term (current) drug therapy: Secondary | ICD-10-CM | POA: Diagnosis not present

## 2022-10-20 DIAGNOSIS — R109 Unspecified abdominal pain: Secondary | ICD-10-CM | POA: Diagnosis not present

## 2022-10-20 DIAGNOSIS — C787 Secondary malignant neoplasm of liver and intrahepatic bile duct: Secondary | ICD-10-CM | POA: Diagnosis not present

## 2022-10-20 DIAGNOSIS — D500 Iron deficiency anemia secondary to blood loss (chronic): Secondary | ICD-10-CM

## 2022-10-20 DIAGNOSIS — C7802 Secondary malignant neoplasm of left lung: Secondary | ICD-10-CM | POA: Diagnosis not present

## 2022-10-20 DIAGNOSIS — D509 Iron deficiency anemia, unspecified: Secondary | ICD-10-CM | POA: Diagnosis not present

## 2022-10-20 MED ORDER — ACETAMINOPHEN 325 MG PO TABS
650.0000 mg | ORAL_TABLET | Freq: Once | ORAL | Status: AC
  Administered 2022-10-20: 650 mg via ORAL
  Filled 2022-10-20: qty 2

## 2022-10-20 MED ORDER — HEPARIN SOD (PORK) LOCK FLUSH 100 UNIT/ML IV SOLN
500.0000 [IU] | Freq: Once | INTRAVENOUS | Status: AC
  Administered 2022-10-20: 500 [IU] via INTRAVENOUS

## 2022-10-20 MED ORDER — SODIUM CHLORIDE 0.9 % IV SOLN
510.0000 mg | Freq: Once | INTRAVENOUS | Status: AC
  Administered 2022-10-20: 510 mg via INTRAVENOUS
  Filled 2022-10-20: qty 17

## 2022-10-20 MED ORDER — METHYLPREDNISOLONE SODIUM SUCC 125 MG IJ SOLR
125.0000 mg | Freq: Once | INTRAMUSCULAR | Status: AC
  Administered 2022-10-20: 125 mg via INTRAVENOUS
  Filled 2022-10-20: qty 2

## 2022-10-20 MED ORDER — CETIRIZINE HCL 10 MG/ML IV SOLN
10.0000 mg | Freq: Once | INTRAVENOUS | Status: AC
  Administered 2022-10-20: 10 mg via INTRAVENOUS
  Filled 2022-10-20: qty 1

## 2022-10-20 MED ORDER — SODIUM CHLORIDE 0.9% FLUSH
10.0000 mL | INTRAVENOUS | Status: DC | PRN
  Administered 2022-10-20: 10 mL via INTRAVENOUS

## 2022-10-20 MED ORDER — FAMOTIDINE IN NACL 20-0.9 MG/50ML-% IV SOLN
20.0000 mg | Freq: Once | INTRAVENOUS | Status: AC
  Administered 2022-10-20: 20 mg via INTRAVENOUS
  Filled 2022-10-20: qty 50

## 2022-10-20 MED ORDER — SODIUM CHLORIDE 0.9 % IV SOLN: Freq: Once | INTRAVENOUS | Status: AC

## 2022-10-20 NOTE — Patient Instructions (Signed)
[facility]  Discharge Instructions: Thank you for choosing [facility] to provide your oncology and hematology care.  If you have a lab appointment with the [facility], please come in thru the Main Entrance and check in at the main information desk.  Wear comfortable clothing and clothing appropriate for easy access to any Portacath or PICC line.   We strive to give you quality time with your provider. You may need to reschedule your appointment if you arrive late (15 or more minutes).  Arriving late affects you and other patients whose appointments are after yours.  Also, if you miss three or more appointments without notifying the office, you may be dismissed from the clinic at the provider's discretion.      For prescription refill requests, have your pharmacy contact our office and allow 72 hours for refills to be completed.    Today you received  Feraheme IV iron.   BELOW ARE SYMPTOMS THAT SHOULD BE REPORTED IMMEDIATELY: *FEVER GREATER THAN 100.4 F (38 C) OR HIGHER *CHILLS OR SWEATING *NAUSEA AND VOMITING THAT IS NOT CONTROLLED WITH YOUR NAUSEA MEDICATION *UNUSUAL SHORTNESS OF BREATH *UNUSUAL BRUISING OR BLEEDING *URINARY PROBLEMS (pain or burning when urinating, or frequent urination) *BOWEL PROBLEMS (unusual diarrhea, constipation, pain near the anus) TENDERNESS IN MOUTH AND THROAT WITH OR WITHOUT PRESENCE OF ULCERS (sore throat, sores in mouth, or a toothache) UNUSUAL RASH, SWELLING OR PAIN  UNUSUAL VAGINAL DISCHARGE OR ITCHING   Items with * indicate a potential emergency and should be followed up as soon as possible or go to the Emergency Department if any problems should occur.  Please show the CHEMOTHERAPY ALERT CARD or IMMUNOTHERAPY ALERT CARD at check-in to the Emergency Department and triage nurse.  Should you have questions after your visit or need to cancel or reschedule your appointment, please contact [facility] [phone]  and follow the prompts.  Office hours are 8:00 a.m. to 4:30 p.m. Monday - Friday. Please note that voicemails left after 4:00 p.m. may not be returned until the following business day.  We are closed weekends and major holidays. You have access to a nurse at all times for urgent questions. Please call the main number to the clinic [phone] and follow the prompts.  For any non-urgent questions, you may also contact your provider using MyChart. We now offer e-Visits for anyone 19 and older to request care online for non-urgent symptoms. For details visit [email or web address].   Also download the MyChart app! Go to the app store, search "MyChart", open the app, select [facility], and log in with your MyChart username and password.

## 2022-10-20 NOTE — Progress Notes (Signed)
Pt presents today for Feraheme IV iron infusion per provider's order. Vital signs stable and pt voiced no new complaints at this time. Pt's HR was 113 at arrival, HR was re-checked with 103. Dr.K made aware and stated, okay to proceed with iron infusion.   During pt's 30 minute wait time of iron completion, pt c/o slight chest pain. Pt stated it felt achy when she breathes in. Dr.K made aware and evaluated pt and stated pt is okay to be discharged home. Pt advised to go to the ER if pain gets worse per Dr.K. Pt verbalized understanding.  Feraheme given today per MD orders. Tolerated infusion without adverse affects. Vital signs stable. No complaints at this time. Discharged from clinic ambulatory in stable condition. Alert and oriented x 3. F/U with [facility] as scheduled.

## 2022-10-22 NOTE — Progress Notes (Signed)
[facility] [address], [place] [address]    Clinic Day:  10/23/2022  Referring physician: Derek Jack, MD  Patient Care Team: Pcp, No as PCP - General Derek Jack, MD as Medical Oncologist (Medical Oncology) Brien Mates, RN as Oncology Nurse Navigator (Medical Oncology)   ASSESSMENT & PLAN:   Assessment: 1.  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 - CTAP (08/30/2022): Irregularity of the endometrium extending through the cervix with foci of gas in the cervix.  Multifocal omental and peritoneal metastasis.  Metastatic left periaortic, aortocaval, left external iliac and right common iliac lymph nodes.  Multiple bilateral lung nodules at bases.  12 mm hypodensity in the liver near the falciform ligament. - Endometrial biopsy (09/09/2022): Invasive moderately to poorly differentiated squamous cell carcinoma with abundant necrosis.  Cervix biopsy: Invasive and in situ moderate to poorly differentiated squamous cell carcinoma.  High risk HPV positive. - NGS: PD-L1 (22 C3): CPS 2, HER2 IHC negative.  PIK3CA exon 10 pathogenic variant.  MS-stable.  TMB-low.  FANCM and SDHA pathogenic variants. - Cycle 1 of cisplatin, paclitaxel, bevacizumab on 10/02/2022, pembrolizumab added with cycle 2 on 10/23/2022 - Bone scan (10/14/2022): No metastatic disease   2.  Social/family history: - She is married and lives at home with her husband and roommate.  She has never worked.  She is current active smoker, half pack per day, for the last 28 years.  She occasionally smokes marijuana. - Mother died of lung cancer.  Father had head and neck cancer.  Maternal grandmother had cancer.  Paternal uncle died of cancer.  Maternal uncle also had cancer.  Plan: 1.  Metastatic cervical cancer to the lungs,  peritoneum and liver: - She has tolerated first cycle reasonably well.  Denies any tingling/numbness, ringing in the ears or decreased hearing. - She reported improvement in vaginal bleeding which completely stopped. - She has mild constipation and uses milk of magnesia. - Reviewed labs today which showed creatinine elevated at 1.01.  CBC was grossly normal. - We discussed bone scan results which did not show any evidence of metastatic disease.  We will arrange for CT scan with Ativan. - She will proceed with cycle 2 today.  We discussed NGS results. - Recommend adding pembrolizumab to cycle 2 as PD-L1 CPS was 8%.  We discussed side effects of immunotherapy in detail. - She will also receive IV fluids tomorrow.  I have told her to increase water intake to 5-6 bottles of 16 ounces.  RTC 3 weeks for follow-up.   2.  Normocytic anemia: - She received Feraheme on 10/13/2022 on 10/20/2022. - Hemoglobin today improved to 9.4.   3.  Abdominal pain: - Abdominal pain improved after first cycle.  Not requiring hydrocodone 5/325 on a regular basis.  4.  Hypertension: - Continue amlodipine 10 mg daily.  Today blood pressure is 144/86.  Orders Placed This Encounter  Procedures   Comprehensive metabolic panel    Standing Status:   Future    Standing Expiration Date:   12/04/2023   Magnesium    Standing Status:   Future    Standing Expiration Date:   12/04/2023   UA Protein, Dipstick - [facility]    Standing Status:   Future    Standing Expiration Date:   12/04/2023   CBC with  Differential    Standing Status:   Future    Standing Expiration Date:   12/05/2023   Comprehensive metabolic panel    Standing Status:   Future    Standing Expiration Date:   12/25/2023   Magnesium    Standing Status:   Future    Standing Expiration Date:   12/25/2023   UA Protein, Dipstick - [facility]    Standing Status:   Future    Standing Expiration Date:   12/25/2023   CBC with Differential    Standing Status:   Future    Standing  Expiration Date:   12/26/2023   Comprehensive metabolic panel    Standing Status:   Future    Standing Expiration Date:   01/15/2024   Magnesium    Standing Status:   Future    Standing Expiration Date:   01/15/2024   UA Protein, Dipstick - [facility]    Standing Status:   Future    Standing Expiration Date:   01/15/2024   CBC with Differential    Standing Status:   Future    Standing Expiration Date:   01/16/2024      Beverly Gust Oliver,acting as a scribe for Derek Jack, MD.,have documented all relevant documentation on the behalf of Derek Jack, MD,as directed by  Derek Jack, MD while in the presence of Derek Jack, MD.   I, Derek Jack MD, have reviewed the above documentation for accuracy and completeness, and I agree with the above.   Derek Jack, MD   2/22/20246:26 PM  CHIEF COMPLAINT:   Diagnosis: Metastatic cervical squamous cell carcinoma.     Cancer Staging  Cervical cancer [facility]) Staging form: Cervix Uteri, AJCC Version 9 - Clinical stage from 09/19/2022: FIGO Stage IVB (cT4, cN2a, cM1) - Unsigned    Prior Therapy: None  Current Therapy:   Cisplatin + Paclitaxel + Bevacizumab     HISTORY OF PRESENT ILLNESS:   Oncology History Overview Note  PD-L1 CPS 8%   Cervical cancer ([place])  09/19/2022 Initial Diagnosis   Cervical cancer ([place])   10/02/2022 -  Chemotherapy   Patient is on Treatment Plan : CERVICAL Cisplatin 50 mg/m2 + PAClitaxel 135 mg/m2 + Bevacizumab 15 mg/kg  q21d        INTERVAL HISTORY:   Ilia is a 52 y.o. female presenting to clinic today for follow up of Metastatic cervical squamous cell carcinoma. She was last seen by me on 10/02/2022.  Today, she states that she is doing well overall. Her appetite level is at 100%. Her energy level is at 100%. She has 5/10 left side pain. She states that her chest pain is completely gone. She denies any new bleeding. She drinks four 16 ounces of water a day but needs  to drink more water. She doesn't want to do the CT scan due to being scared. She denies tingling in the hands and feet.    PAST MEDICAL HISTORY:   Past Medical History: Past Medical History:  Diagnosis Date   Anemia    Asthma    Bronchitis    Cancer ([place])    Cervical   CHF (congestive heart failure) (HCC)    COPD (chronic obstructive pulmonary disease) (HCC)    Depression    GERD (gastroesophageal reflux disease)    Headache    Hypertension    Port-A-Cath in place 09/25/2022    Surgical History: Past Surgical History:  Procedure Laterality Date   BREAST SURGERY     CESAREAN SECTION  IR IMAGING GUIDED PORT INSERTION  09/30/2022   LEG SURGERY      Social History: Social History   Socioeconomic History   Marital status: Married    Spouse name: Not on file   Number of children: Not on file   Years of education: Not on file   Highest education level: Not on file  Occupational History   Not on file  Tobacco Use   Smoking status: Every Day    Packs/day: 0.25    Years: 23.00    Total pack years: 5.75    Types: Cigarettes   Smokeless tobacco: Never  Vaping Use   Vaping Use: Never used  Substance and Sexual Activity   Alcohol use: Yes    Comment: occ   Drug use: Yes    Types: Marijuana   Sexual activity: Yes    Birth control/protection: None  Other Topics Concern   Not on file  Social History Narrative   Not on file   Social Determinants of Health   Financial Resource Strain: Low Risk  (09/09/2022)   Overall Financial Resource Strain (CARDIA)    Difficulty of Paying Living Expenses: Not hard at all  Food Insecurity: No Food Insecurity (09/22/2022)   Hunger Vital Sign    Worried About Running Out of Food in the Last Year: Never true    Ran Out of Food in the Last Year: Never true  Transportation Needs: No Transportation Needs (09/22/2022)   PRAPARE - Hydrologist (Medical): No    Lack of Transportation (Non-Medical): No   Recent Concern: Transportation Needs - Unmet Transportation Needs (09/09/2022)   PRAPARE - Transportation    Lack of Transportation (Medical): Yes    Lack of Transportation (Non-Medical): Yes  Physical Activity: Insufficiently Active (09/09/2022)   Exercise Vital Sign    Days of Exercise per Week: 2 days    Minutes of Exercise per Session: 20 min  Stress: No Stress Concern Present (09/09/2022)   [place]    Feeling of Stress : Only a little  Social Connections: Socially Isolated (09/09/2022)   Social Connection and Isolation Panel [NHANES]    Frequency of Communication with Friends and Family: Three times a week    Frequency of Social Gatherings with Friends and Family: More than three times a week    Attends Religious Services: Never    Marine scientist or Organizations: No    Attends Archivist Meetings: Never    Marital Status: Separated  Intimate Partner Violence: Not At Risk (09/22/2022)   Humiliation, Afraid, Rape, and Kick questionnaire    Fear of Current or Ex-Partner: No    Emotionally Abused: No    Physically Abused: No    Sexually Abused: No    Family History: Family History  Problem Relation Age of Onset   Dermatomyositis Mother    Hypertension Mother    Cancer Mother        lung   Cancer Father        lung   Heart disease Father    Lung cancer Paternal Uncle    Lung cancer Maternal Uncle    Colon cancer Neg Hx    Breast cancer Neg Hx    Ovarian cancer Neg Hx    Endometrial cancer Neg Hx    Pancreatic cancer Neg Hx    Prostate cancer Neg Hx     Current Medications:  Current Outpatient Medications:  albuterol (VENTOLIN HFA) 108 (90 Base) MCG/ACT inhaler, Inhale 2 puffs into the lungs every 6 (six) hours as needed for wheezing or shortness of breath., Disp: 8 g, Rfl: 2   amLODipine (NORVASC) 10 MG tablet, Take 1 tablet (10 mg total) by mouth daily., Disp: 30 tablet, Rfl: 2    Atezolizumab (TECENTRIQ IV), Inject into the vein every 21 ( twenty-one) days., Disp: , Rfl:    Bevacizumab (AVASTIN IV), Inject into the vein every 21 ( twenty-one) days., Disp: , Rfl:    calcium carbonate (TUMS) 500 MG chewable tablet, Chew 1 tablet (200 mg of elemental calcium total) by mouth 3 (three) times daily with meals., Disp: 60 tablet, Rfl: 3   CISPLATIN IV, Inject into the vein every 21 ( twenty-one) days., Disp: , Rfl:    dexamethasone (DECADRON) 4 MG tablet, Take 2 tablets daily x 3 days starting the day after cisplatin chemotherapy. Take with food., Disp: 30 tablet, Rfl: 1   HYDROcodone-acetaminophen (NORCO/VICODIN) 5-325 MG tablet, Take 1 tablet by mouth every 12 (twelve) hours as needed for moderate pain., Disp: 60 tablet, Rfl: 0   ondansetron (ZOFRAN) 8 MG tablet, Take 1 tablet (8 mg total) by mouth every 8 (eight) hours as needed for nausea or vomiting. Start on the third day after cisplatin., Disp: 30 tablet, Rfl: 1   PACLITAXEL IV, Inject into the vein every 21 ( twenty-one) days., Disp: , Rfl:    pantoprazole (PROTONIX) 40 MG tablet, Take 1 tablet (40 mg total) by mouth daily., Disp: 90 tablet, Rfl: 1   lidocaine-prilocaine (EMLA) cream, Apply a dime-sized amount to port a cath site and cover with plastic wrap 1 hour prior to infusion appointments (Patient not taking: Reported on 10/23/2022), Disp: 30 g, Rfl: 3   LORazepam (ATIVAN) 0.5 MG tablet, Take one tablet by mouth three hours prior to CT scan; take the second tablet by mouth upon arrival to radiology for procedure, Disp: 2 tablet, Rfl: 0   prochlorperazine (COMPAZINE) 10 MG tablet, Take 1 tablet (10 mg total) by mouth every 6 (six) hours as needed (Nausea or vomiting). (Patient not taking: Reported on 10/23/2022), Disp: 30 tablet, Rfl: 1 No current facility-administered medications for this visit.  Facility-Administered Medications Ordered in Other Visits:    0.9 %  sodium chloride infusion, , Intravenous, Continuous,  Derek Jack, MD, Stopped at 10/23/22 1542   sodium chloride flush (NS) 0.9 % injection 10 mL, 10 mL, Intracatheter, PRN, Derek Jack, MD, 10 mL at 10/23/22 1542   Allergies: Allergies  Allergen Reactions   Carrot [Daucus Carota] Hives   Lisinopril-Hydrochlorothiazide     Oral swelling   Ibuprofen Other (See Comments)    Oral swelling   Tramadol Hives   Erythromycin Hives    REVIEW OF SYSTEMS:   Review of Systems  Constitutional:  Negative for chills, fatigue and fever.  HENT:   Negative for lump/mass, mouth sores, nosebleeds, sore throat and trouble swallowing.   Eyes:  Negative for eye problems.  Respiratory:  Negative for cough and shortness of breath.   Cardiovascular:  Negative for chest pain, leg swelling and palpitations.  Gastrointestinal:  Positive for constipation. Negative for abdominal pain, diarrhea, nausea and vomiting.  Genitourinary:  Negative for bladder incontinence, difficulty urinating, dysuria, frequency, hematuria and nocturia.   Musculoskeletal:  Negative for arthralgias, back pain, flank pain, myalgias and neck pain.  Skin:  Negative for itching and rash.  Neurological:  Positive for headaches. Negative for dizziness and numbness.  Hematological:  Does not bruise/bleed easily.  Psychiatric/Behavioral:  Negative for depression, sleep disturbance and suicidal ideas. The patient is not nervous/anxious.   All other systems reviewed and are negative.    VITALS:   Blood pressure (!) 144/86, pulse 88, temperature 97.9 F (36.6 C), temperature source Oral, resp. rate 18, weight 188 lb 1.6 oz (85.3 kg), SpO2 96 %.  Wt Readings from Last 3 Encounters:  10/23/22 188 lb 1.6 oz (85.3 kg)  10/13/22 181 lb (82.1 kg)  10/10/22 184 lb (83.5 kg)    Body mass index is 32.29 kg/m.  Performance status (ECOG): 1 - Symptomatic but completely ambulatory  PHYSICAL EXAM:   Physical Exam Vitals and nursing note reviewed. Exam conducted with a chaperone  present.  Constitutional:      Appearance: Normal appearance.  Cardiovascular:     Rate and Rhythm: Normal rate and regular rhythm.     Pulses: Normal pulses.     Heart sounds: Normal heart sounds.  Pulmonary:     Effort: Pulmonary effort is normal.     Breath sounds: Normal breath sounds.  Abdominal:     Palpations: Abdomen is soft. There is no hepatomegaly, splenomegaly or mass.     Tenderness: There is no abdominal tenderness.  Musculoskeletal:     Right lower leg: No edema.     Left lower leg: No edema.  Lymphadenopathy:     Cervical: No cervical adenopathy.     Right cervical: No superficial, deep or posterior cervical adenopathy.    Left cervical: No superficial, deep or posterior cervical adenopathy.     Upper Body:     Right upper body: No supraclavicular or axillary adenopathy.     Left upper body: No supraclavicular or axillary adenopathy.  Neurological:     General: No focal deficit present.     Mental Status: She is alert and oriented to person, place, and time.  Psychiatric:        Mood and Affect: Mood normal.        Behavior: Behavior normal.     LABS:      Latest Ref Rng & Units 10/23/2022    8:04 AM 10/13/2022   10:33 AM 10/02/2022    8:38 AM  CBC  WBC 4.0 - 10.5 K/uL 9.4  6.8  8.7   Hemoglobin 12.0 - 15.0 g/dL 9.4  9.2  8.7   Hematocrit 36.0 - 46.0 % 29.8  28.7  27.1   Platelets 150 - 400 K/uL 240  469  368       Latest Ref Rng & Units 10/23/2022    8:04 AM 10/13/2022   10:33 AM 10/02/2022    8:38 AM  CMP  Glucose 70 - 99 mg/dL 92  85  102   BUN 6 - 20 mg/dL 14  8  8   $ Creatinine 0.44 - 1.00 mg/dL 1.01  0.94  1.00   Sodium 135 - 145 mmol/L 132  135  132   Potassium 3.5 - 5.1 mmol/L 3.7  3.9  4.0   Chloride 98 - 111 mmol/L 100  104  99   CO2 22 - 32 mmol/L 23  22  24   $ Calcium 8.9 - 10.3 mg/dL 8.5  8.2  8.4   Total Protein 6.5 - 8.1 g/dL 6.6  7.0  7.5   Total Bilirubin 0.3 - 1.2 mg/dL 0.3  0.3  0.2   Alkaline Phos 38 - 126 U/L 51  59  63   AST 15  -  41 U/L 16  21  20   $ ALT 0 - 44 U/L 9  14  8      $ No results found for: "CEA1", "CEA" / No results found for: "CEA1", "CEA" No results found for: "PSA1" No results found for: "[id number]" No results found for: "CAN125"  No results found for: "TOTALPROTELP", "ALBUMINELP", "A1GS", "A2GS", "BETS", "BETA2SER", "GAMS", "MSPIKE", "SPEI" Lab Results  Component Value Date   TIBC 412 09/22/2022   FERRITIN 35 09/22/2022   IRONPCTSAT 7 (L) 09/22/2022   No results found for: "LDH"   STUDIES:   NM Bone Scan Whole Body  Result Date: 10/16/2022 CLINICAL DATA:  Cervical cancer, history of right ankle and foot injury remote EXAM: NUCLEAR MEDICINE WHOLE BODY BONE SCAN TECHNIQUE: Whole body anterior and posterior images were obtained approximately 3 hours after intravenous injection of radiopharmaceutical. RADIOPHARMACEUTICALS:  19.5 mCi Technetium-21mMDP IV COMPARISON:  CT 08/30/2022, no previous bone scan available for comparison. FINDINGS: Asymmetric generalized activity involving the right ankle and foot presumably due to history of remote trauma and possible degenerative change. No suspicious foci of activity to suggest metastatic disease. Mild activity at left posterior lower ribs appears to correspond to subacute to remote appearing rib fractures. Normal physiologic activity in the kidneys and bladder. IMPRESSION: No definite evidence for skeletal metastatic disease. Generalized asymmetric activity at the right ankle and foot, likely related to the history of remote trauma Electronically Signed   By: KDonavan FoilM.D.   On: 10/16/2022 23:00   IR IMAGING GUIDED PORT INSERTION  Result Date: 09/30/2022 INDICATION: 52year old female referred for port catheter placement EXAM: IMAGE GUIDED PORT CATHETER MEDICATIONS: None ANESTHESIA/SEDATION: Moderate (conscious) sedation was employed during this procedure. A total of Versed 2.0 mg and Fentanyl 100 mcg was administered intravenously. Moderate Sedation Time:  26 minutes. The patient's level of consciousness and vital signs were monitored continuously by radiology nursing throughout the procedure under my direct supervision. FLUOROSCOPY TIME:  Fluoroscopy Time: 0 minutes 6 seconds (1 mGy). COMPLICATIONS: None PROCEDURE: Informed written consent was obtained from the patient after a thorough discussion of the procedural risks, benefits and alternatives. All questions were addressed. Maximal Sterile Barrier Technique was utilized including caps, mask, sterile gowns, sterile gloves, sterile drape, hand hygiene and skin antiseptic. A timeout was performed prior to the initiation of the procedure. Ultrasound survey was performed with images stored and sent to PACs. Right IJ vein documented to be patent. The right neck and chest was prepped with chlorhexidine, and draped in the usual sterile fashion using maximum barrier technique (cap and mask, sterile gown, sterile gloves, large sterile sheet, hand hygiene and cutaneous antiseptic). Local anesthesia was attained by infiltration with 1% lidocaine without epinephrine. Ultrasound demonstrated patency of the right internal jugular vein, and this was documented with an image. Under real-time ultrasound guidance, this vein was accessed with a 21 gauge micropuncture needle and image documentation was performed. A small dermatotomy was made at the access site with an 11 scalpel. A 0.018" wire was advanced into the SVC and used to estimate the length of the internal catheter. The access needle exchanged for a 39F micropuncture vascular sheath. The 0.018" wire was then removed and a 0.035" wire advanced into the IVC. An appropriate location for the subcutaneous reservoir was selected below the clavicle and an incision was made through the skin and underlying soft tissues. The subcutaneous tissues were then dissected using a combination of blunt and sharp surgical technique and a pocket was formed. A  single lumen power injectable  portacatheter was then tunneled through the subcutaneous tissues from the pocket to the dermatotomy and the port reservoir placed within the subcutaneous pocket. The venous access site was then serially dilated and a peel away vascular sheath placed over the wire. The wire was removed and the port catheter advanced into position under fluoroscopic guidance. The catheter tip is positioned in the cavoatrial junction. This was documented with a spot image. The portacatheter was then tested and found to flush and aspirate well. The port was flushed with saline followed by 100 units/mL heparinized saline. The pocket was then closed in two layers using first subdermal inverted interrupted absorbable sutures followed by a running subcuticular suture. The epidermis was then sealed with Dermabond. Steri-Strips were placed. The dermatotomy at the venous access site was also seal with Dermabond. Patient tolerated the procedure well and remained hemodynamically stable throughout. No complications encountered and no significant blood loss encountered IMPRESSION: Status post right IJ port catheter. Signed, Dulcy Fanny. Nadene Rubins, RPVI [facility] Electronically Signed   By: Corrie Mckusick D.O.   On: 09/30/2022 15:13

## 2022-10-23 ENCOUNTER — Inpatient Hospital Stay: Payer: Medicaid Other

## 2022-10-23 ENCOUNTER — Inpatient Hospital Stay (HOSPITAL_BASED_OUTPATIENT_CLINIC_OR_DEPARTMENT_OTHER): Payer: Medicaid Other | Admitting: Hematology

## 2022-10-23 ENCOUNTER — Other Ambulatory Visit: Payer: Self-pay | Admitting: *Deleted

## 2022-10-23 VITALS — BP 144/86 | HR 88 | Temp 97.9°F | Resp 18 | Wt 188.1 lb

## 2022-10-23 VITALS — BP 153/79 | HR 99 | Temp 97.1°F | Resp 18

## 2022-10-23 DIAGNOSIS — F17210 Nicotine dependence, cigarettes, uncomplicated: Secondary | ICD-10-CM | POA: Diagnosis not present

## 2022-10-23 DIAGNOSIS — Z95828 Presence of other vascular implants and grafts: Secondary | ICD-10-CM

## 2022-10-23 DIAGNOSIS — C787 Secondary malignant neoplasm of liver and intrahepatic bile duct: Secondary | ICD-10-CM | POA: Diagnosis not present

## 2022-10-23 DIAGNOSIS — C538 Malignant neoplasm of overlapping sites of cervix uteri: Secondary | ICD-10-CM | POA: Diagnosis not present

## 2022-10-23 DIAGNOSIS — R109 Unspecified abdominal pain: Secondary | ICD-10-CM | POA: Diagnosis not present

## 2022-10-23 DIAGNOSIS — C7801 Secondary malignant neoplasm of right lung: Secondary | ICD-10-CM | POA: Diagnosis not present

## 2022-10-23 DIAGNOSIS — E860 Dehydration: Secondary | ICD-10-CM | POA: Diagnosis not present

## 2022-10-23 DIAGNOSIS — C539 Malignant neoplasm of cervix uteri, unspecified: Secondary | ICD-10-CM | POA: Diagnosis not present

## 2022-10-23 DIAGNOSIS — Z5112 Encounter for antineoplastic immunotherapy: Secondary | ICD-10-CM | POA: Diagnosis not present

## 2022-10-23 DIAGNOSIS — D509 Iron deficiency anemia, unspecified: Secondary | ICD-10-CM | POA: Diagnosis not present

## 2022-10-23 DIAGNOSIS — Z79899 Other long term (current) drug therapy: Secondary | ICD-10-CM | POA: Diagnosis not present

## 2022-10-23 DIAGNOSIS — C786 Secondary malignant neoplasm of retroperitoneum and peritoneum: Secondary | ICD-10-CM | POA: Diagnosis not present

## 2022-10-23 DIAGNOSIS — Z5111 Encounter for antineoplastic chemotherapy: Secondary | ICD-10-CM | POA: Diagnosis not present

## 2022-10-23 DIAGNOSIS — C7802 Secondary malignant neoplasm of left lung: Secondary | ICD-10-CM | POA: Diagnosis not present

## 2022-10-23 LAB — COMPREHENSIVE METABOLIC PANEL
ALT: 9 U/L (ref 0–44)
AST: 16 U/L (ref 15–41)
Albumin: 3.2 g/dL — ABNORMAL LOW (ref 3.5–5.0)
Alkaline Phosphatase: 51 U/L (ref 38–126)
Anion gap: 9 (ref 5–15)
BUN: 14 mg/dL (ref 6–20)
CO2: 23 mmol/L (ref 22–32)
Calcium: 8.5 mg/dL — ABNORMAL LOW (ref 8.9–10.3)
Chloride: 100 mmol/L (ref 98–111)
Creatinine, Ser: 1.01 mg/dL — ABNORMAL HIGH (ref 0.44–1.00)
GFR, Estimated: >60 mL/min (ref >60)
Glucose, Bld: 92 mg/dL (ref 70–99)
Potassium: 3.7 mmol/L (ref 3.5–5.1)
Sodium: 132 mmol/L — ABNORMAL LOW (ref 135–145)
Total Bilirubin: 0.3 mg/dL (ref 0.3–1.2)
Total Protein: 6.6 g/dL (ref 6.5–8.1)

## 2022-10-23 LAB — CBC WITH DIFFERENTIAL/PLATELET
Abs Immature Granulocytes: 0.05 10*3/uL (ref 0.00–0.07)
Basophils Absolute: 0.1 10*3/uL (ref 0.0–0.1)
Basophils Relative: 1 %
Eosinophils Absolute: 0.1 10*3/uL (ref 0.0–0.5)
Eosinophils Relative: 2 %
HCT: 29.8 % — ABNORMAL LOW (ref 36.0–46.0)
Hemoglobin: 9.4 g/dL — ABNORMAL LOW (ref 12.0–15.0)
Immature Granulocytes: 1 %
Lymphocytes Relative: 38 %
Lymphs Abs: 3.5 10*3/uL (ref 0.7–4.0)
MCH: 25.9 pg — ABNORMAL LOW (ref 26.0–34.0)
MCHC: 31.5 g/dL (ref 30.0–36.0)
MCV: 82.1 fL (ref 80.0–100.0)
Monocytes Absolute: 0.8 10*3/uL (ref 0.1–1.0)
Monocytes Relative: 9 %
Neutro Abs: 4.8 10*3/uL (ref 1.7–7.7)
Neutrophils Relative %: 49 %
Platelets: 240 10*3/uL (ref 150–400)
RBC: 3.63 MIL/uL — ABNORMAL LOW (ref 3.87–5.11)
RDW: 21.5 % — ABNORMAL HIGH (ref 11.5–15.5)
WBC Morphology: REACTIVE
WBC: 9.4 10*3/uL (ref 4.0–10.5)
nRBC: 0 % (ref 0.0–0.2)

## 2022-10-23 LAB — MAGNESIUM: Magnesium: 1.7 mg/dL (ref 1.7–2.4)

## 2022-10-23 MED ORDER — SODIUM CHLORIDE 0.9 % IV SOLN: Freq: Once | INTRAVENOUS | Status: AC

## 2022-10-23 MED ORDER — SODIUM CHLORIDE 0.9 % IV SOLN
135.0000 mg/m2 | Freq: Once | INTRAVENOUS | Status: AC
  Administered 2022-10-23: 276 mg via INTRAVENOUS
  Filled 2022-10-23: qty 46

## 2022-10-23 MED ORDER — MAGNESIUM SULFATE 2 GM/50ML IV SOLN
2.0000 g | Freq: Once | INTRAVENOUS | Status: AC
  Administered 2022-10-23: 2 g via INTRAVENOUS
  Filled 2022-10-23: qty 50

## 2022-10-23 MED ORDER — HEPARIN SOD (PORK) LOCK FLUSH 100 UNIT/ML IV SOLN
500.0000 [IU] | Freq: Once | INTRAVENOUS | Status: AC | PRN
  Administered 2022-10-23: 500 [IU]

## 2022-10-23 MED ORDER — ACETAMINOPHEN 325 MG PO TABS
650.0000 mg | ORAL_TABLET | Freq: Once | ORAL | Status: AC
  Administered 2022-10-23: 650 mg via ORAL
  Filled 2022-10-23: qty 2

## 2022-10-23 MED ORDER — SODIUM CHLORIDE 0.9 % IV SOLN
150.0000 mg | Freq: Once | INTRAVENOUS | Status: AC
  Administered 2022-10-23: 150 mg via INTRAVENOUS
  Filled 2022-10-23: qty 150

## 2022-10-23 MED ORDER — SODIUM CHLORIDE 0.9 % IV SOLN
50.0000 mg/m2 | Freq: Once | INTRAVENOUS | Status: AC
  Administered 2022-10-23: 100 mg via INTRAVENOUS
  Filled 2022-10-23: qty 100

## 2022-10-23 MED ORDER — SODIUM CHLORIDE 0.9% FLUSH
10.0000 mL | INTRAVENOUS | Status: DC | PRN
  Administered 2022-10-23: 10 mL

## 2022-10-23 MED ORDER — SODIUM CHLORIDE 0.9 % IV SOLN: 200.0000 mg | Freq: Once | INTRAVENOUS | Status: DC

## 2022-10-23 MED ORDER — SODIUM CHLORIDE 0.9 % IV SOLN
10.0000 mg | Freq: Once | INTRAVENOUS | Status: AC
  Administered 2022-10-23: 10 mg via INTRAVENOUS
  Filled 2022-10-23: qty 10

## 2022-10-23 MED ORDER — LORAZEPAM 0.5 MG PO TABS: ORAL_TABLET | ORAL | 0 refills | Status: DC

## 2022-10-23 MED ORDER — SODIUM CHLORIDE 0.9 % IV SOLN: INTRAVENOUS | Status: DC

## 2022-10-23 MED ORDER — CETIRIZINE HCL 10 MG/ML IV SOLN
10.0000 mg | Freq: Once | INTRAVENOUS | Status: AC
  Administered 2022-10-23: 10 mg via INTRAVENOUS
  Filled 2022-10-23: qty 1

## 2022-10-23 MED ORDER — PALONOSETRON HCL INJECTION 0.25 MG/5ML
0.2500 mg | Freq: Once | INTRAVENOUS | Status: AC
  Administered 2022-10-23: 0.25 mg via INTRAVENOUS
  Filled 2022-10-23: qty 5

## 2022-10-23 MED ORDER — POTASSIUM CHLORIDE IN NACL 20-0.9 MEQ/L-% IV SOLN
Freq: Once | INTRAVENOUS | Status: AC
  Filled 2022-10-23: qty 1000

## 2022-10-23 MED ORDER — SODIUM CHLORIDE 0.9 % IV SOLN
15.0000 mg/kg | Freq: Once | INTRAVENOUS | Status: AC
  Administered 2022-10-23: 1300 mg via INTRAVENOUS
  Filled 2022-10-23: qty 52

## 2022-10-23 MED ORDER — FAMOTIDINE IN NACL 20-0.9 MG/50ML-% IV SOLN
20.0000 mg | Freq: Once | INTRAVENOUS | Status: AC
  Administered 2022-10-23: 20 mg via INTRAVENOUS
  Filled 2022-10-23: qty 50

## 2022-10-23 NOTE — Progress Notes (Signed)
Keytruda added on for today - pharmacy does not have dose - Reschedule  Keytruda to tomorrow with labs - TSH/T4.  Give remainder of chemotherapy treatment plan today.  Valerie Roys, PharmD

## 2022-10-23 NOTE — Patient Instructions (Signed)
[place] at [facility] Discharge Instructions   You were seen and examined today by Dr. Delton Coombes.  He reviewed the results of your lab work which are normal/stable.   We will proceed with your treatment today.  Try to increase your water intake to 6 bottle per day.   Return as scheduled.    Thank you for choosing [facility] at [facility] to provide your oncology and hematology care.  To afford each patient quality time with our provider, please arrive at least 15 minutes before your scheduled appointment time.   If you have a lab appointment with the [facility] please come in thru the Main Entrance and check in at the main information desk.  You need to re-schedule your appointment should you arrive 10 or more minutes late.  We strive to give you quality time with our providers, and arriving late affects you and other patients whose appointments are after yours.  Also, if you no show three or more times for appointments you may be dismissed from the clinic at the providers discretion.     Again, thank you for choosing [facility].  Our hope is that these requests will decrease the amount of time that you wait before being seen by our physicians.       _____________________________________________________________  Should you have questions after your visit to [facility], please contact our office at [phone] and follow the prompts.  Our office hours are 8:00 a.m. and 4:30 p.m. Monday - Friday.  Please note that voicemails left after 4:00 p.m. may not be returned until the following business day.  We are closed weekends and major holidays.  You do have access to a nurse 24-7, just call the main number to the clinic [phone] and do not press any options, hold on the line and a nurse will answer the phone.    For prescription refill requests, have your pharmacy contact our office and allow 72 hours.     Due to Covid, you will need to wear a mask upon entering the hospital. If you do not have a mask, a mask will be given to you at the Main Entrance upon arrival. For doctor visits, patients may have 1 support person age 17 or older with them. For treatment visits, patients can not have anyone with them due to social distancing guidelines and our immunocompromised population.

## 2022-10-23 NOTE — Progress Notes (Signed)
Patient has been examined by Dr. Delton Coombes. Vital signs and labs have been reviewed by MD - ANC, Creatinine, LFTs, hemoglobin, and platelets are within treatment parameters per M.D. - pt may proceed with treatment.  Primary RN and pharmacy notified.

## 2022-10-23 NOTE — Progress Notes (Signed)
Labs reviewed in office visit today with MD. Madaline Brilliant to proceed as planned per MD. Patient will get her Keytruda and hydration fluids tomorrow, Friday the 23rd. Patient is aware.   Treatment given per orders. Patient tolerated it well without problems. Vitals stable and discharged home from clinic ambulatory. Follow up as scheduled.

## 2022-10-23 NOTE — Patient Instructions (Signed)
Kristin Carson  Discharge Instructions: Thank you for choosing [facility] to provide your oncology and hematology care.  If you have a lab appointment with the [facility], please come in thru the Main Entrance and check in at the main information desk.  Wear comfortable clothing and clothing appropriate for easy access to any Portacath or PICC line.   We strive to give you quality time with your provider. You may need to reschedule your appointment if you arrive late (15 or more minutes).  Arriving late affects you and other patients whose appointments are after yours.  Also, if you miss three or more appointments without notifying the office, you may be dismissed from the clinic at the provider's discretion.      For prescription refill requests, have your pharmacy contact our office and allow 72 hours for refills to be completed.    Today you received the following chemotherapy and/or immunotherapy, vegzelma, cisplatin, paclitaxel   To help prevent nausea and vomiting after your treatment, we encourage you to take your nausea medication as directed.  BELOW ARE SYMPTOMS THAT SHOULD BE REPORTED IMMEDIATELY: *FEVER GREATER THAN 100.4 F (38 C) OR HIGHER *CHILLS OR SWEATING *NAUSEA AND VOMITING THAT IS NOT CONTROLLED WITH YOUR NAUSEA MEDICATION *UNUSUAL SHORTNESS OF BREATH *UNUSUAL BRUISING OR BLEEDING *URINARY PROBLEMS (pain or burning when urinating, or frequent urination) *BOWEL PROBLEMS (unusual diarrhea, constipation, pain near the anus) TENDERNESS IN MOUTH AND THROAT WITH OR WITHOUT PRESENCE OF ULCERS (sore throat, sores in mouth, or a toothache) UNUSUAL RASH, SWELLING OR PAIN  UNUSUAL VAGINAL DISCHARGE OR ITCHING   Items with * indicate a potential emergency and should be followed up as soon as possible or go to the Emergency Department if any problems should occur.  Please show the CHEMOTHERAPY ALERT CARD or IMMUNOTHERAPY ALERT CARD at check-in  to the Emergency Department and triage nurse.  Should you have questions after your visit or need to cancel or reschedule your appointment, please contact [facility] [phone]  and follow the prompts.  Office hours are 8:00 a.m. to 4:30 p.m. Monday - Friday. Please note that voicemails left after 4:00 p.m. may not be returned until the following business day.  We are closed weekends and major holidays. You have access to a nurse at all times for urgent questions. Please call the main number to the clinic [phone] and follow the prompts.  For any non-urgent questions, you may also contact your provider using MyChart. We now offer e-Visits for anyone 18 and older to request care online for non-urgent symptoms. For details visit [email or web address].   Also download the MyChart app! Go to the app store, search "MyChart", open the app, select [facility], and log in with your MyChart username and password.

## 2022-10-24 ENCOUNTER — Ambulatory Visit: Payer: Medicaid Other | Admitting: Family Medicine

## 2022-10-24 ENCOUNTER — Inpatient Hospital Stay: Payer: Medicaid Other

## 2022-10-24 VITALS — BP 132/81 | HR 89 | Temp 97.8°F | Resp 18

## 2022-10-24 DIAGNOSIS — R109 Unspecified abdominal pain: Secondary | ICD-10-CM | POA: Diagnosis not present

## 2022-10-24 DIAGNOSIS — D509 Iron deficiency anemia, unspecified: Secondary | ICD-10-CM | POA: Diagnosis not present

## 2022-10-24 DIAGNOSIS — C787 Secondary malignant neoplasm of liver and intrahepatic bile duct: Secondary | ICD-10-CM | POA: Diagnosis not present

## 2022-10-24 DIAGNOSIS — C539 Malignant neoplasm of cervix uteri, unspecified: Secondary | ICD-10-CM | POA: Diagnosis not present

## 2022-10-24 DIAGNOSIS — C538 Malignant neoplasm of overlapping sites of cervix uteri: Secondary | ICD-10-CM

## 2022-10-24 DIAGNOSIS — F17210 Nicotine dependence, cigarettes, uncomplicated: Secondary | ICD-10-CM | POA: Diagnosis not present

## 2022-10-24 DIAGNOSIS — Z95828 Presence of other vascular implants and grafts: Secondary | ICD-10-CM

## 2022-10-24 DIAGNOSIS — Z5112 Encounter for antineoplastic immunotherapy: Secondary | ICD-10-CM | POA: Diagnosis not present

## 2022-10-24 DIAGNOSIS — Z79899 Other long term (current) drug therapy: Secondary | ICD-10-CM | POA: Diagnosis not present

## 2022-10-24 DIAGNOSIS — Z5111 Encounter for antineoplastic chemotherapy: Secondary | ICD-10-CM | POA: Diagnosis not present

## 2022-10-24 DIAGNOSIS — D500 Iron deficiency anemia secondary to blood loss (chronic): Secondary | ICD-10-CM

## 2022-10-24 DIAGNOSIS — C786 Secondary malignant neoplasm of retroperitoneum and peritoneum: Secondary | ICD-10-CM | POA: Diagnosis not present

## 2022-10-24 DIAGNOSIS — C7802 Secondary malignant neoplasm of left lung: Secondary | ICD-10-CM | POA: Diagnosis not present

## 2022-10-24 DIAGNOSIS — E860 Dehydration: Secondary | ICD-10-CM | POA: Diagnosis not present

## 2022-10-24 DIAGNOSIS — C7801 Secondary malignant neoplasm of right lung: Secondary | ICD-10-CM | POA: Diagnosis not present

## 2022-10-24 LAB — TSH: TSH: 0.901 u[IU]/mL (ref 0.350–4.500)

## 2022-10-24 MED ORDER — SODIUM CHLORIDE 0.9 % IV SOLN: Freq: Once | INTRAVENOUS | Status: AC

## 2022-10-24 MED ORDER — POTASSIUM CHLORIDE IN NACL 20-0.9 MEQ/L-% IV SOLN
Freq: Once | INTRAVENOUS | Status: AC
  Filled 2022-10-24: qty 1000

## 2022-10-24 MED ORDER — SODIUM CHLORIDE 0.9% FLUSH
10.0000 mL | INTRAVENOUS | Status: DC | PRN
  Administered 2022-10-24: 10 mL

## 2022-10-24 MED ORDER — SODIUM CHLORIDE 0.9 % IV SOLN
200.0000 mg | Freq: Once | INTRAVENOUS | Status: AC
  Administered 2022-10-24: 200 mg via INTRAVENOUS
  Filled 2022-10-24: qty 8

## 2022-10-24 MED ORDER — MAGNESIUM SULFATE 2 GM/50ML IV SOLN
2.0000 g | Freq: Once | INTRAVENOUS | Status: AC
  Administered 2022-10-24: 2 g via INTRAVENOUS
  Filled 2022-10-24: qty 50

## 2022-10-24 MED ORDER — HEPARIN SOD (PORK) LOCK FLUSH 100 UNIT/ML IV SOLN
500.0000 [IU] | Freq: Once | INTRAVENOUS | Status: AC | PRN
  Administered 2022-10-24: 500 [IU]

## 2022-10-24 NOTE — Patient Instructions (Signed)
[facility]  Discharge Instructions: Thank you for choosing [facility] to provide your oncology and hematology care.  If you have a lab appointment with the Cancer Center, please come in thru the Main Entrance and check in at the main information desk.  Wear comfortable clothing and clothing appropriate for easy access to any Portacath or PICC line.   We strive to give you quality time with your provider. You may need to reschedule your appointment if you arrive late (15 or more minutes).  Arriving late affects you and other patients whose appointments are after yours.  Also, if you miss three or more appointments without notifying the office, you may be dismissed from the clinic at the provider's discretion.      For prescription refill requests, have your pharmacy contact our office and allow 72 hours for refills to be completed.    Today you received the following chemotherapy and/or immunotherapy agents Keytruda      To help prevent nausea and vomiting after your treatment, we encourage you to take your nausea medication as directed.  BELOW ARE SYMPTOMS THAT SHOULD BE REPORTED IMMEDIATELY: *FEVER GREATER THAN 100.4 F (38 C) OR HIGHER *CHILLS OR SWEATING *NAUSEA AND VOMITING THAT IS NOT CONTROLLED WITH YOUR NAUSEA MEDICATION *UNUSUAL SHORTNESS OF BREATH *UNUSUAL BRUISING OR BLEEDING *URINARY PROBLEMS (pain or burning when urinating, or frequent urination) *BOWEL PROBLEMS (unusual diarrhea, constipation, pain near the anus) TENDERNESS IN MOUTH AND THROAT WITH OR WITHOUT PRESENCE OF ULCERS (sore throat, sores in mouth, or a toothache) UNUSUAL RASH, SWELLING OR PAIN  UNUSUAL VAGINAL DISCHARGE OR ITCHING   Items with * indicate a potential emergency and should be followed up as soon as possible or go to the Emergency Department if any problems should occur.  Please show the CHEMOTHERAPY ALERT CARD or IMMUNOTHERAPY ALERT CARD at check-in to the  Emergency Department and triage nurse.  Should you have questions after your visit or need to cancel or reschedule your appointment, please contact [facility] [phone]  and follow the prompts.  Office hours are 8:00 a.m. to 4:30 p.m. Monday - Friday. Please note that voicemails left after 4:00 p.m. may not be returned until the following business day.  We are closed weekends and major holidays. You have access to a nurse at all times for urgent questions. Please call the main number to the clinic [phone] and follow the prompts.  For any non-urgent questions, you may also contact your provider using MyChart. We now offer e-Visits for anyone 18 and older to request care online for non-urgent symptoms. For details visit [email or web address].   Also download the MyChart app! Go to the app store, search "MyChart", open the app, select [facility], and log in with your MyChart username and password.   

## 2022-10-24 NOTE — Progress Notes (Signed)
Keytruda and hydration fluids with potassium and magnesium given per orders. Patient tolerated it well without problems. Vitals stable and discharged home from clinic ambulatory. Follow up as scheduled.

## 2022-10-30 ENCOUNTER — Other Ambulatory Visit: Payer: Self-pay | Admitting: Hematology

## 2022-10-30 DIAGNOSIS — G893 Neoplasm related pain (acute) (chronic): Secondary | ICD-10-CM

## 2022-10-30 MED ORDER — HYDROCODONE-ACETAMINOPHEN 5-325 MG PO TABS: 1.0000 | ORAL_TABLET | Freq: Three times a day (TID) | ORAL | 0 refills | Status: DC | PRN

## 2022-10-30 MED ORDER — HYDROCODONE-ACETAMINOPHEN 5-325 MG PO TABS: 1.0000 | ORAL_TABLET | Freq: Two times a day (BID) | ORAL | 0 refills | Status: DC | PRN

## 2022-10-31 DIAGNOSIS — Z419 Encounter for procedure for purposes other than remedying health state, unspecified: Secondary | ICD-10-CM | POA: Diagnosis not present

## 2022-11-10 ENCOUNTER — Ambulatory Visit (HOSPITAL_COMMUNITY)
Admission: RE | Admit: 2022-11-10 | Discharge: 2022-11-10 | Disposition: A | Discharge status: Home / Self Care | Payer: Medicaid Other | Source: Ambulatory Visit | Attending: Hematology | Admitting: Hematology

## 2022-11-10 DIAGNOSIS — C538 Malignant neoplasm of overlapping sites of cervix uteri: Secondary | ICD-10-CM | POA: Insufficient documentation

## 2022-11-10 MED ORDER — IOHEXOL 300 MG/ML  SOLN
75.0000 mL | Freq: Once | INTRAMUSCULAR | Status: AC | PRN
  Administered 2022-11-10: 75 mL via INTRAVENOUS

## 2022-11-12 ENCOUNTER — Inpatient Hospital Stay: Payer: Medicaid Other | Attending: Hematology

## 2022-11-12 ENCOUNTER — Inpatient Hospital Stay (HOSPITAL_BASED_OUTPATIENT_CLINIC_OR_DEPARTMENT_OTHER): Payer: Medicaid Other | Admitting: Hematology

## 2022-11-12 DIAGNOSIS — R109 Unspecified abdominal pain: Secondary | ICD-10-CM | POA: Diagnosis not present

## 2022-11-12 DIAGNOSIS — Z79633 Long term (current) use of mitotic inhibitor: Secondary | ICD-10-CM | POA: Diagnosis not present

## 2022-11-12 DIAGNOSIS — I509 Heart failure, unspecified: Secondary | ICD-10-CM | POA: Insufficient documentation

## 2022-11-12 DIAGNOSIS — C7801 Secondary malignant neoplasm of right lung: Secondary | ICD-10-CM | POA: Insufficient documentation

## 2022-11-12 DIAGNOSIS — C786 Secondary malignant neoplasm of retroperitoneum and peritoneum: Secondary | ICD-10-CM | POA: Insufficient documentation

## 2022-11-12 DIAGNOSIS — Z5111 Encounter for antineoplastic chemotherapy: Secondary | ICD-10-CM | POA: Insufficient documentation

## 2022-11-12 DIAGNOSIS — Z5112 Encounter for antineoplastic immunotherapy: Secondary | ICD-10-CM | POA: Diagnosis not present

## 2022-11-12 DIAGNOSIS — Z801 Family history of malignant neoplasm of trachea, bronchus and lung: Secondary | ICD-10-CM | POA: Diagnosis not present

## 2022-11-12 DIAGNOSIS — R21 Rash and other nonspecific skin eruption: Secondary | ICD-10-CM | POA: Diagnosis not present

## 2022-11-12 DIAGNOSIS — C7802 Secondary malignant neoplasm of left lung: Secondary | ICD-10-CM | POA: Insufficient documentation

## 2022-11-12 DIAGNOSIS — F17210 Nicotine dependence, cigarettes, uncomplicated: Secondary | ICD-10-CM | POA: Diagnosis not present

## 2022-11-12 DIAGNOSIS — Z95828 Presence of other vascular implants and grafts: Secondary | ICD-10-CM

## 2022-11-12 DIAGNOSIS — C787 Secondary malignant neoplasm of liver and intrahepatic bile duct: Secondary | ICD-10-CM | POA: Diagnosis not present

## 2022-11-12 DIAGNOSIS — D649 Anemia, unspecified: Secondary | ICD-10-CM | POA: Diagnosis not present

## 2022-11-12 DIAGNOSIS — I110 Hypertensive heart disease with heart failure: Secondary | ICD-10-CM | POA: Insufficient documentation

## 2022-11-12 DIAGNOSIS — C538 Malignant neoplasm of overlapping sites of cervix uteri: Secondary | ICD-10-CM

## 2022-11-12 DIAGNOSIS — C539 Malignant neoplasm of cervix uteri, unspecified: Secondary | ICD-10-CM | POA: Insufficient documentation

## 2022-11-12 LAB — CBC WITH DIFFERENTIAL/PLATELET
Abs Immature Granulocytes: 0.03 10*3/uL (ref 0.00–0.07)
Basophils Absolute: 0 10*3/uL (ref 0.0–0.1)
Basophils Relative: 0 %
Eosinophils Absolute: 0.4 10*3/uL (ref 0.0–0.5)
Eosinophils Relative: 4 %
HCT: 30.5 % — ABNORMAL LOW (ref 36.0–46.0)
Hemoglobin: 9.8 g/dL — ABNORMAL LOW (ref 12.0–15.0)
Immature Granulocytes: 0 %
Lymphocytes Relative: 35 %
Lymphs Abs: 3.1 10*3/uL (ref 0.7–4.0)
MCH: 27.6 pg (ref 26.0–34.0)
MCHC: 32.1 g/dL (ref 30.0–36.0)
MCV: 85.9 fL (ref 80.0–100.0)
Monocytes Absolute: 0.9 10*3/uL (ref 0.1–1.0)
Monocytes Relative: 10 %
Neutro Abs: 4.4 10*3/uL (ref 1.7–7.7)
Neutrophils Relative %: 51 %
Platelets: 233 10*3/uL (ref 150–400)
RBC: 3.55 MIL/uL — ABNORMAL LOW (ref 3.87–5.11)
RDW: 25.1 % — ABNORMAL HIGH (ref 11.5–15.5)
WBC: 8.8 10*3/uL (ref 4.0–10.5)
nRBC: 0 % (ref 0.0–0.2)

## 2022-11-12 LAB — COMPREHENSIVE METABOLIC PANEL
ALT: 10 U/L (ref 0–44)
AST: 17 U/L (ref 15–41)
Albumin: 3 g/dL — ABNORMAL LOW (ref 3.5–5.0)
Alkaline Phosphatase: 71 U/L (ref 38–126)
Anion gap: 11 (ref 5–15)
BUN: 9 mg/dL (ref 6–20)
CO2: 22 mmol/L (ref 22–32)
Calcium: 8.4 mg/dL — ABNORMAL LOW (ref 8.9–10.3)
Chloride: 99 mmol/L (ref 98–111)
Creatinine, Ser: 1.16 mg/dL — ABNORMAL HIGH (ref 0.44–1.00)
GFR, Estimated: 58 mL/min — ABNORMAL LOW (ref >60)
Glucose, Bld: 95 mg/dL (ref 70–99)
Potassium: 3.7 mmol/L (ref 3.5–5.1)
Sodium: 132 mmol/L — ABNORMAL LOW (ref 135–145)
Total Bilirubin: 0.7 mg/dL (ref 0.3–1.2)
Total Protein: 6.6 g/dL (ref 6.5–8.1)

## 2022-11-12 LAB — MAGNESIUM: Magnesium: 2 mg/dL (ref 1.7–2.4)

## 2022-11-12 MED ORDER — SODIUM CHLORIDE 0.9% FLUSH
10.0000 mL | Freq: Once | INTRAVENOUS | Status: AC
  Administered 2022-11-12: 10 mL via INTRAVENOUS

## 2022-11-12 MED ORDER — HEPARIN SOD (PORK) LOCK FLUSH 100 UNIT/ML IV SOLN
500.0000 [IU] | Freq: Once | INTRAVENOUS | Status: AC
  Administered 2022-11-12: 500 [IU] via INTRAVENOUS

## 2022-11-12 NOTE — Progress Notes (Signed)
[facility] [address] [place] [address]    Clinic Day:  11/12/2022  Referring physician: Derek Jack, MD  Patient Care Team: Pcp, No as PCP - General Derek Jack, MD as Medical Oncologist (Medical Oncology) Brien Mates, RN as Oncology Nurse Navigator (Medical Oncology)   ASSESSMENT & PLAN:   Assessment: 1.  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 - CTAP (08/30/2022): Irregularity of the endometrium extending through the cervix with foci of gas in the cervix.  Multifocal omental and peritoneal metastasis.  Metastatic left periaortic, aortocaval, left external iliac and right common iliac lymph nodes.  Multiple bilateral lung nodules at bases.  12 mm hypodensity in the liver near the falciform ligament. - Endometrial biopsy (09/09/2022): Invasive moderately to poorly differentiated squamous cell carcinoma with abundant necrosis.  Cervix biopsy: Invasive and in situ moderate to poorly differentiated squamous cell carcinoma.  High risk HPV positive. - NGS: PD-L1 (22 C3): CPS 2, HER2 IHC negative.  PIK3CA exon 10 pathogenic variant.  MS-stable.  TMB-low.  FANCM and SDHA pathogenic variants. - PD-L1 CPS: 8% - Cycle 1 of cisplatin, paclitaxel, bevacizumab on 10/02/2022, pembrolizumab added with cycle 2 on 10/23/2022 - Bone scan (10/14/2022): No metastatic disease   2.  Social/family history: - She is married and lives at home with her husband and roommate.  She has never worked.  She is current active smoker, half pack per day, for the last 28 years.  She occasionally smokes marijuana. - Mother died of lung cancer.  Father had head and neck cancer.  Maternal grandmother had cancer.  Paternal uncle died of cancer.  Maternal uncle also had cancer.  Plan: 1.  Metastatic cervical cancer to  the lungs, peritoneum and liver: - She has tolerated cycle 2 reasonably well.  She denies any ringing in the ears.  No tingling or numbness reported. - Reviewed labs today which showed creatinine 1.16.  She is drinking 3 bottles of water per day.  I have told her to increase to 5 bottles per day.  CBC shows normal white count and platelet count. - I have also reviewed CT chest from 11/10/2022: Decrease in size of several lung nodules with no new progressive disease. - Bone scan (10/14/2022): No evidence of bone mets. - She will come tomorrow for cycle 3.  No dose modifications.  Will also give 1 L of fluid on day 2. - RTC 3 weeks for follow-up with cycle 4.  I plan to repeat CTAP after cycle 4.   2.  Normocytic anemia: - She received Feraheme on 10/13/2022 and 10/20/2022. - Hemoglobin today is 9.8.   3.  Abdominal pain: - Abdominal pain improved after first cycle.  Not requiring hydrocodone on a regular basis.  4.  Hypertension: - Continue amlodipine 10 mg daily.  Blood pressure is 140/90.  Orders Placed This Encounter  Procedures   CBC with Differential/Platelet    Standing Status:   Future    Standing Expiration Date:   11/12/2023    Order Specific Question:   Release to patient    Answer:   Immediate   Comprehensive metabolic panel    Standing Status:   Future    Standing Expiration Date:   11/12/2023    Order Specific Question:   Release to patient    Answer:  Immediate   Magnesium    Standing Status:   Future    Standing Expiration Date:   11/12/2023    Order Specific Question:   Release to patient    Answer:   Immediate   TSH    Standing Status:   Future    Standing Expiration Date:   11/12/2023    Order Specific Question:   Release to patient    Answer:   Immediate     I,Alexis Herring,acting as a scribe for Derek Jack, MD.,have documented all relevant documentation on the behalf of Derek Jack, MD,as directed by  Derek Jack, MD while in the  presence of Derek Jack, MD.  I, Derek Jack MD, have reviewed the above documentation for accuracy and completeness, and I agree with the above.   Derek Jack, MD   3/13/20245:04 PM  CHIEF COMPLAINT:   Diagnosis: Metastatic cervical squamous cell carcinoma.     Cancer Staging  Cervical cancer [facility]) Staging form: Cervix Uteri, AJCC Version 9 - Clinical stage from 09/19/2022: FIGO Stage IVB (cT4, cN2a, cM1) - Unsigned    Prior Therapy: None  Current Therapy:   Cisplatin + Paclitaxel + Bevacizumab     HISTORY OF PRESENT ILLNESS:   Oncology History Overview Note  PD-L1 CPS 8%   Cervical cancer ([facility])  09/19/2022 Initial Diagnosis   Cervical cancer ([facility])   10/02/2022 -  Chemotherapy   Patient is on Treatment Plan : CERVICAL Cisplatin 50 mg/m2 + PAClitaxel 135 mg/m2 + Bevacizumab 15 mg/kg  q21d        INTERVAL HISTORY:   Kristin Carson is a 52 y.o. female presenting to clinic today for follow up of Metastatic cervical squamous cell carcinoma. She was last seen by me on 10/23/22.  Today, she states that she is doing well overall. Her appetite level is at 100%. Her energy level is at 90%.    PAST MEDICAL HISTORY:   Past Medical History: Past Medical History:  Diagnosis Date   Anemia    Asthma    Bronchitis    Cancer (HCC)    Cervical   CHF (congestive heart failure) (HCC)    COPD (chronic obstructive pulmonary disease) (HCC)    Depression    GERD (gastroesophageal reflux disease)    Headache    Hypertension    Port-A-Cath in place 09/25/2022    Surgical History: Past Surgical History:  Procedure Laterality Date   BREAST SURGERY     CESAREAN SECTION     IR IMAGING GUIDED PORT INSERTION  09/30/2022   LEG SURGERY      Social History: Social History   Socioeconomic History   Marital status: Married    Spouse name: Not on file   Number of children: Not on file   Years of education: Not on file   Highest education level: Not on file   Occupational History   Not on file  Tobacco Use   Smoking status: Every Day    Packs/day: 0.25    Years: 23.00    Total pack years: 5.75    Types: Cigarettes   Smokeless tobacco: Never  Vaping Use   Vaping Use: Never used  Substance and Sexual Activity   Alcohol use: Yes    Comment: occ   Drug use: Yes    Types: Marijuana   Sexual activity: Yes    Birth control/protection: None  Other Topics Concern   Not on file  Social History Narrative   Not on file   Social Determinants  of Health   Financial Resource Strain: Low Risk  (09/09/2022)   Overall Financial Resource Strain (CARDIA)    Difficulty of Paying Living Expenses: Not hard at all  Food Insecurity: No Food Insecurity (09/22/2022)   Hunger Vital Sign    Worried About Running Out of Food in the Last Year: Never true    Ran Out of Food in the Last Year: Never true  Transportation Needs: No Transportation Needs (09/22/2022)   PRAPARE - Hydrologist (Medical): No    Lack of Transportation (Non-Medical): No  Recent Concern: Transportation Needs - Unmet Transportation Needs (09/09/2022)   PRAPARE - Transportation    Lack of Transportation (Medical): Yes    Lack of Transportation (Non-Medical): Yes  Physical Activity: Insufficiently Active (09/09/2022)   Exercise Vital Sign    Days of Exercise per Week: 2 days    Minutes of Exercise per Session: 20 min  Stress: No Stress Concern Present (09/09/2022)   [place]    Feeling of Stress : Only a little  Social Connections: Socially Isolated (09/09/2022)   Social Connection and Isolation Panel [NHANES]    Frequency of Communication with Friends and Family: Three times a week    Frequency of Social Gatherings with Friends and Family: More than three times a week    Attends Religious Services: Never    Marine scientist or Organizations: No    Attends Archivist Meetings: Never     Marital Status: Separated  Intimate Partner Violence: Not At Risk (09/22/2022)   Humiliation, Afraid, Rape, and Kick questionnaire    Fear of Current or Ex-Partner: No    Emotionally Abused: No    Physically Abused: No    Sexually Abused: No    Family History: Family History  Problem Relation Age of Onset   Dermatomyositis Mother    Hypertension Mother    Cancer Mother        lung   Cancer Father        lung   Heart disease Father    Lung cancer Paternal Uncle    Lung cancer Maternal Uncle    Colon cancer Neg Hx    Breast cancer Neg Hx    Ovarian cancer Neg Hx    Endometrial cancer Neg Hx    Pancreatic cancer Neg Hx    Prostate cancer Neg Hx     Current Medications:  Current Outpatient Medications:    albuterol (VENTOLIN HFA) 108 (90 Base) MCG/ACT inhaler, Inhale 2 puffs into the lungs every 6 (six) hours as needed for wheezing or shortness of breath., Disp: 8 g, Rfl: 2   amLODipine (NORVASC) 10 MG tablet, Take 1 tablet (10 mg total) by mouth daily., Disp: 30 tablet, Rfl: 2   Atezolizumab (TECENTRIQ IV), Inject into the vein every 21 ( twenty-one) days., Disp: , Rfl:    Bevacizumab (AVASTIN IV), Inject into the vein every 21 ( twenty-one) days., Disp: , Rfl:    calcium carbonate (TUMS) 500 MG chewable tablet, Chew 1 tablet (200 mg of elemental calcium total) by mouth 3 (three) times daily with meals., Disp: 60 tablet, Rfl: 3   CISPLATIN IV, Inject into the vein every 21 ( twenty-one) days., Disp: , Rfl:    dexamethasone (DECADRON) 4 MG tablet, Take 2 tablets daily x 3 days starting the day after cisplatin chemotherapy. Take with food., Disp: 30 tablet, Rfl: 1   HYDROcodone-acetaminophen (NORCO/VICODIN) 5-325 MG  tablet, Take 1 tablet by mouth every 8 (eight) hours as needed for moderate pain., Disp: 60 tablet, Rfl: 0   lidocaine-prilocaine (EMLA) cream, Apply a dime-sized amount to port a cath site and cover with plastic wrap 1 hour prior to infusion appointments, Disp: 30  g, Rfl: 3   LORazepam (ATIVAN) 0.5 MG tablet, Take one tablet by mouth three hours prior to CT scan; take the second tablet by mouth upon arrival to radiology for procedure, Disp: 2 tablet, Rfl: 0   ondansetron (ZOFRAN) 8 MG tablet, Take 1 tablet (8 mg total) by mouth every 8 (eight) hours as needed for nausea or vomiting. Start on the third day after cisplatin., Disp: 30 tablet, Rfl: 1   PACLITAXEL IV, Inject into the vein every 21 ( twenty-one) days., Disp: , Rfl:    pantoprazole (PROTONIX) 40 MG tablet, Take 1 tablet (40 mg total) by mouth daily., Disp: 90 tablet, Rfl: 1   prochlorperazine (COMPAZINE) 10 MG tablet, Take 1 tablet (10 mg total) by mouth every 6 (six) hours as needed (Nausea or vomiting)., Disp: 30 tablet, Rfl: 1   Allergies: Allergies  Allergen Reactions   Carrot [Daucus Carota] Hives   Lisinopril-Hydrochlorothiazide     Oral swelling   Ibuprofen Other (See Comments)    Oral swelling   Tramadol Hives   Erythromycin Hives    REVIEW OF SYSTEMS:   Review of Systems  Constitutional:  Negative for chills, fatigue and fever.  HENT:   Negative for lump/mass, mouth sores, nosebleeds, sore throat and trouble swallowing.   Eyes:  Negative for eye problems.  Respiratory:  Positive for cough. Negative for shortness of breath.   Cardiovascular:  Negative for chest pain, leg swelling and palpitations.  Gastrointestinal:  Negative for abdominal pain, constipation, diarrhea, nausea and vomiting.  Genitourinary:  Negative for bladder incontinence, difficulty urinating, dysuria, frequency, hematuria and nocturia.   Musculoskeletal:  Negative for arthralgias, back pain, flank pain, myalgias and neck pain.  Skin:  Negative for itching and rash.  Neurological:  Negative for dizziness, headaches and numbness.  Hematological:  Does not bruise/bleed easily.  Psychiatric/Behavioral:  Negative for depression, sleep disturbance and suicidal ideas. The patient is nervous/anxious.   All other  systems reviewed and are negative.    VITALS:   There were no vitals taken for this visit.  Wt Readings from Last 3 Encounters:  10/23/22 188 lb 1.6 oz (85.3 kg)  10/13/22 181 lb (82.1 kg)  10/10/22 184 lb (83.5 kg)    There is no height or weight on file to calculate BMI.  Performance status (ECOG): 1 - Symptomatic but completely ambulatory  PHYSICAL EXAM:   Physical Exam Vitals and nursing note reviewed. Exam conducted with a chaperone present.  Constitutional:      Appearance: Normal appearance.  Cardiovascular:     Rate and Rhythm: Normal rate and regular rhythm.     Pulses: Normal pulses.     Heart sounds: Normal heart sounds.  Pulmonary:     Effort: Pulmonary effort is normal.     Breath sounds: Normal breath sounds.  Abdominal:     Palpations: Abdomen is soft. There is no hepatomegaly, splenomegaly or mass.     Tenderness: There is no abdominal tenderness.  Musculoskeletal:     Right lower leg: No edema.     Left lower leg: No edema.  Lymphadenopathy:     Cervical: No cervical adenopathy.     Right cervical: No superficial, deep or posterior cervical adenopathy.  Left cervical: No superficial, deep or posterior cervical adenopathy.     Upper Body:     Right upper body: No supraclavicular or axillary adenopathy.     Left upper body: No supraclavicular or axillary adenopathy.  Neurological:     General: No focal deficit present.     Mental Status: She is alert and oriented to person, place, and time.  Psychiatric:        Mood and Affect: Mood normal.        Behavior: Behavior normal.     LABS:      Latest Ref Rng & Units 11/12/2022    2:27 PM 10/23/2022    8:04 AM 10/13/2022   10:33 AM  CBC  WBC 4.0 - 10.5 K/uL 8.8  9.4  6.8   Hemoglobin 12.0 - 15.0 g/dL 9.8  9.4  9.2   Hematocrit 36.0 - 46.0 % 30.5  29.8  28.7   Platelets 150 - 400 K/uL 233  240  469       Latest Ref Rng & Units 11/12/2022    2:27 PM 10/23/2022    8:04 AM 10/13/2022   10:33 AM   CMP  Glucose 70 - 99 mg/dL 95  92  85   BUN 6 - 20 mg/dL '9  14  8   '$ Creatinine 0.44 - 1.00 mg/dL 1.16  1.01  0.94   Sodium 135 - 145 mmol/L 132  132  135   Potassium 3.5 - 5.1 mmol/L 3.7  3.7  3.9   Chloride 98 - 111 mmol/L 99  100  104   CO2 22 - 32 mmol/L '22  23  22   '$ Calcium 8.9 - 10.3 mg/dL 8.4  8.5  8.2   Total Protein 6.5 - 8.1 g/dL 6.6  6.6  7.0   Total Bilirubin 0.3 - 1.2 mg/dL 0.7  0.3  0.3   Alkaline Phos 38 - 126 U/L 71  51  59   AST 15 - 41 U/L '17  16  21   '$ ALT 0 - 44 U/L '10  9  14      '$ No results found for: "CEA1", "CEA" / No results found for: "CEA1", "CEA" No results found for: "PSA1" No results found for: "[id number]" No results found for: "CAN125"  No results found for: "TOTALPROTELP", "ALBUMINELP", "A1GS", "A2GS", "BETS", "BETA2SER", "GAMS", "MSPIKE", "SPEI" Lab Results  Component Value Date   TIBC 412 09/22/2022   FERRITIN 35 09/22/2022   IRONPCTSAT 7 (L) 09/22/2022   No results found for: "LDH"   STUDIES:   CT Chest W Contrast  Result Date: 11/10/2022 CLINICAL DATA:  Metastatic squamous cell carcinoma of the cervix. Chest staging. * Tracking Code: BO * EXAM: CT CHEST WITH CONTRAST TECHNIQUE: Multidetector CT imaging of the chest was performed during intravenous contrast administration. RADIATION DOSE REDUCTION: This exam was performed according to the departmental dose-optimization program which includes automated exposure control, adjustment of the mA and/or kV according to patient size and/or use of iterative reconstruction technique. CONTRAST:  82m OMNIPAQUE IOHEXOL 300 MG/ML  SOLN COMPARISON:  08/30/2022 CT abdomen/pelvis. 08/28/2022 chest radiograph. 01/12/2018 chest CT angiogram. FINDINGS: Cardiovascular: Normal heart size. No significant pericardial effusion/thickening. Left anterior descending coronary atherosclerosis. Right internal jugular Port-A-Cath terminates at the cavoatrial junction. Atherosclerotic nonaneurysmal thoracic aorta. Normal caliber  pulmonary arteries. No central pulmonary emboli. Mediastinum/Nodes: No significant thyroid nodules. Unremarkable esophagus. No right axillary adenopathy. Borderline prominent left axillary lymph nodes up to 1.0 cm short axis diameter (series  2/image 34), not substantially changed from 01/12/2018 CT. No pathologically enlarged mediastinal or hilar lymph nodes. Lungs/Pleura: No pneumothorax. No pleural effusion. Mild centrilobular emphysema with diffuse bronchial wall thickening. No acute consolidative airspace disease. Several solid pulmonary nodules scattered at both lung bases, all decreased mildly from 08/30/2022 CT abdomen study. Representative 1.0 cm peripheral lingular nodule (series 4/image 77), previously 1.2 cm. Representative 0.9 cm right middle lobe nodule (series 4/image 93), previously 1.2 cm. Representative peripheral lingula 0.7 cm nodule (series 4/image 66), previously 1.1 cm. No new or enlarging nodules. Upper abdomen: No acute abnormality. Musculoskeletal:  No aggressive appearing focal osseous lesions. IMPRESSION: 1. Several solid pulmonary nodules scattered at both lung bases, all decreased mildly from 08/30/2022 CT abdomen study, compatible with positive response to therapy of pulmonary metastases. 2. No new or progressive metastatic disease in the chest. 3. Borderline prominent left axillary lymph nodes, not substantially changed from 01/12/2018 CT, nonspecific, potentially reactive. 4. One vessel coronary atherosclerosis. 5. Mild centrilobular emphysema with diffuse bronchial wall thickening, compatible with the reported history of COPD. 6. Aortic Atherosclerosis (ICD10-I70.0) and Emphysema (ICD10-J43.9). Electronically Signed   By: Ilona Sorrel M.D.   On: 11/10/2022 15:03   NM Bone Scan Whole Body  Result Date: 10/16/2022 CLINICAL DATA:  Cervical cancer, history of right ankle and foot injury remote EXAM: NUCLEAR MEDICINE WHOLE BODY BONE SCAN TECHNIQUE: Whole body anterior and posterior  images were obtained approximately 3 hours after intravenous injection of radiopharmaceutical. RADIOPHARMACEUTICALS:  19.5 mCi Technetium-59mMDP IV COMPARISON:  CT 08/30/2022, no previous bone scan available for comparison. FINDINGS: Asymmetric generalized activity involving the right ankle and foot presumably due to history of remote trauma and possible degenerative change. No suspicious foci of activity to suggest metastatic disease. Mild activity at left posterior lower ribs appears to correspond to subacute to remote appearing rib fractures. Normal physiologic activity in the kidneys and bladder. IMPRESSION: No definite evidence for skeletal metastatic disease. Generalized asymmetric activity at the right ankle and foot, likely related to the history of remote trauma Electronically Signed   By: KDonavan FoilM.D.   On: 10/16/2022 23:00

## 2022-11-12 NOTE — Patient Instructions (Addendum)
[facility]  Discharge Instructions  You were seen and examined today by Dr. Delton Coombes.  Dr. Delton Coombes discussed your most recent lab work which revealed that what we have back so far looks good.  Get your treatment tomorrow as scheduled.  Follow-up as scheduled.    Thank you for choosing [facility] to provide your oncology and hematology care.   To afford each patient quality time with our provider, please arrive at least 15 minutes before your scheduled appointment time. You may need to reschedule your appointment if you arrive late (10 or more minutes). Arriving late affects you and other patients whose appointments are after yours.  Also, if you miss three or more appointments without notifying the office, you may be dismissed from the clinic at the provider's discretion.    Again, thank you for choosing [facility].  Our hope is that these requests will decrease the amount of time that you wait before being seen by our physicians.   If you have a lab appointment with the [facility] please come in thru the Main Entrance and check in at the main information desk.           _____________________________________________________________  Should you have questions after your visit to [facility], please contact our office at [phone] and follow the prompts.  Our office hours are 8:00 a.m. to 4:30 p.m. Monday - Thursday and 8:00 a.m. to 2:30 p.m. Friday.  Please note that voicemails left after 4:00 p.m. may not be returned until the following business day.  We are closed weekends and all major holidays.  You do have access to a nurse 24-7, just call the main number to the clinic [phone] and do not press any options, hold on the line and a nurse will answer the phone.    For prescription refill requests, have your pharmacy contact our office and allow 72 hours.    Masks are optional in the  cancer centers. If you would like for your care team to wear a mask while they are taking care of you, please let them know. You may have one support person who is at least 50 years old accompany you for your appointments.

## 2022-11-13 ENCOUNTER — Other Ambulatory Visit: Payer: Medicaid Other

## 2022-11-13 ENCOUNTER — Inpatient Hospital Stay: Payer: Medicaid Other

## 2022-11-13 ENCOUNTER — Ambulatory Visit: Payer: Medicaid Other | Admitting: Hematology

## 2022-11-13 ENCOUNTER — Ambulatory Visit: Payer: Medicaid Other

## 2022-11-13 VITALS — BP 142/65 | HR 97 | Temp 97.0°F | Resp 18 | Wt 190.2 lb

## 2022-11-13 DIAGNOSIS — C7801 Secondary malignant neoplasm of right lung: Secondary | ICD-10-CM | POA: Diagnosis not present

## 2022-11-13 DIAGNOSIS — R109 Unspecified abdominal pain: Secondary | ICD-10-CM | POA: Diagnosis not present

## 2022-11-13 DIAGNOSIS — C539 Malignant neoplasm of cervix uteri, unspecified: Secondary | ICD-10-CM | POA: Diagnosis not present

## 2022-11-13 DIAGNOSIS — Z5111 Encounter for antineoplastic chemotherapy: Secondary | ICD-10-CM | POA: Diagnosis not present

## 2022-11-13 DIAGNOSIS — C7802 Secondary malignant neoplasm of left lung: Secondary | ICD-10-CM | POA: Diagnosis not present

## 2022-11-13 DIAGNOSIS — I509 Heart failure, unspecified: Secondary | ICD-10-CM | POA: Diagnosis not present

## 2022-11-13 DIAGNOSIS — C787 Secondary malignant neoplasm of liver and intrahepatic bile duct: Secondary | ICD-10-CM | POA: Diagnosis not present

## 2022-11-13 DIAGNOSIS — C538 Malignant neoplasm of overlapping sites of cervix uteri: Secondary | ICD-10-CM

## 2022-11-13 DIAGNOSIS — C786 Secondary malignant neoplasm of retroperitoneum and peritoneum: Secondary | ICD-10-CM | POA: Diagnosis not present

## 2022-11-13 DIAGNOSIS — Z5112 Encounter for antineoplastic immunotherapy: Secondary | ICD-10-CM | POA: Diagnosis not present

## 2022-11-13 DIAGNOSIS — Z95828 Presence of other vascular implants and grafts: Secondary | ICD-10-CM

## 2022-11-13 DIAGNOSIS — Z801 Family history of malignant neoplasm of trachea, bronchus and lung: Secondary | ICD-10-CM | POA: Diagnosis not present

## 2022-11-13 DIAGNOSIS — I110 Hypertensive heart disease with heart failure: Secondary | ICD-10-CM | POA: Diagnosis not present

## 2022-11-13 DIAGNOSIS — F17210 Nicotine dependence, cigarettes, uncomplicated: Secondary | ICD-10-CM | POA: Diagnosis not present

## 2022-11-13 MED ORDER — HEPARIN SOD (PORK) LOCK FLUSH 100 UNIT/ML IV SOLN: 500.0000 [IU] | Freq: Once | INTRAVENOUS | Status: DC | PRN

## 2022-11-13 MED ORDER — PALONOSETRON HCL INJECTION 0.25 MG/5ML
0.2500 mg | Freq: Once | INTRAVENOUS | Status: AC
  Administered 2022-11-13: 0.25 mg via INTRAVENOUS
  Filled 2022-11-13: qty 5

## 2022-11-13 MED ORDER — SODIUM CHLORIDE 0.9 % IV SOLN: Freq: Once | INTRAVENOUS | Status: AC

## 2022-11-13 MED ORDER — SODIUM CHLORIDE 0.9 % IV SOLN
200.0000 mg | Freq: Once | INTRAVENOUS | Status: AC
  Administered 2022-11-13: 200 mg via INTRAVENOUS
  Filled 2022-11-13: qty 8

## 2022-11-13 MED ORDER — FAMOTIDINE IN NACL 20-0.9 MG/50ML-% IV SOLN
20.0000 mg | Freq: Once | INTRAVENOUS | Status: AC
  Administered 2022-11-13: 20 mg via INTRAVENOUS
  Filled 2022-11-13: qty 50

## 2022-11-13 MED ORDER — POTASSIUM CHLORIDE IN NACL 20-0.9 MEQ/L-% IV SOLN
Freq: Once | INTRAVENOUS | Status: AC
  Filled 2022-11-13: qty 1000

## 2022-11-13 MED ORDER — SODIUM CHLORIDE 0.9 % IV SOLN
150.0000 mg | Freq: Once | INTRAVENOUS | Status: AC
  Administered 2022-11-13: 150 mg via INTRAVENOUS
  Filled 2022-11-13: qty 150

## 2022-11-13 MED ORDER — MAGNESIUM SULFATE 2 GM/50ML IV SOLN
2.0000 g | Freq: Once | INTRAVENOUS | Status: AC
  Administered 2022-11-13: 2 g via INTRAVENOUS
  Filled 2022-11-13: qty 50

## 2022-11-13 MED ORDER — SODIUM CHLORIDE 0.9 % IV SOLN
135.0000 mg/m2 | Freq: Once | INTRAVENOUS | Status: AC
  Administered 2022-11-13: 276 mg via INTRAVENOUS
  Filled 2022-11-13: qty 46

## 2022-11-13 MED ORDER — SODIUM CHLORIDE 0.9 % IV SOLN
50.0000 mg/m2 | Freq: Once | INTRAVENOUS | Status: AC
  Administered 2022-11-13: 100 mg via INTRAVENOUS
  Filled 2022-11-13: qty 100

## 2022-11-13 MED ORDER — SODIUM CHLORIDE 0.9 % IV SOLN
10.0000 mg | Freq: Once | INTRAVENOUS | Status: AC
  Administered 2022-11-13: 10 mg via INTRAVENOUS
  Filled 2022-11-13: qty 10

## 2022-11-13 MED ORDER — SODIUM CHLORIDE 0.9% FLUSH: 10.0000 mL | INTRAVENOUS | Status: DC | PRN

## 2022-11-13 MED ORDER — SODIUM CHLORIDE 0.9 % IV SOLN
15.0000 mg/kg | Freq: Once | INTRAVENOUS | Status: AC
  Administered 2022-11-13: 1300 mg via INTRAVENOUS
  Filled 2022-11-13: qty 48

## 2022-11-13 MED ORDER — CETIRIZINE HCL 10 MG/ML IV SOLN
10.0000 mg | Freq: Once | INTRAVENOUS | Status: AC
  Administered 2022-11-13: 10 mg via INTRAVENOUS
  Filled 2022-11-13: qty 1

## 2022-11-13 NOTE — Progress Notes (Signed)
Patient presents today for Keytruda/Vegzelma/Taxol/Cisplatin infusions per providers order.  Vital signs and labs within parameters for treatment..  Patient has no new complaints at this time.  Treatment given today per MD orders.  Stable during infusion without adverse affects.  Vital signs stable.  No complaints at this time.  Discharge from clinic ambulatory in stable condition.  Alert and oriented X 3.  Follow up with [facility] as scheduled.

## 2022-11-13 NOTE — Patient Instructions (Signed)
[facility]  Discharge Instructions: Thank you for choosing [facility] to provide your oncology and hematology care.  If you have a lab appointment with the [facility], please come in thru the Main Entrance and check in at the main information desk.  Wear comfortable clothing and clothing appropriate for easy access to any Portacath or PICC line.   We strive to give you quality time with your provider. You may need to reschedule your appointment if you arrive late (15 or more minutes).  Arriving late affects you and other patients whose appointments are after yours.  Also, if you miss three or more appointments without notifying the office, you may be dismissed from the clinic at the provider's discretion.      For prescription refill requests, have your pharmacy contact our office and allow 72 hours for refills to be completed.    Today you received the following chemotherapy and/or immunotherapy agents Keytruda/Vegzelma/Taxol/Cisplatin      To help prevent nausea and vomiting after your treatment, we encourage you to take your nausea medication as directed.  BELOW ARE SYMPTOMS THAT SHOULD BE REPORTED IMMEDIATELY: *FEVER GREATER THAN 100.4 F (38 C) OR HIGHER *CHILLS OR SWEATING *NAUSEA AND VOMITING THAT IS NOT CONTROLLED WITH YOUR NAUSEA MEDICATION *UNUSUAL SHORTNESS OF BREATH *UNUSUAL BRUISING OR BLEEDING *URINARY PROBLEMS (pain or burning when urinating, or frequent urination) *BOWEL PROBLEMS (unusual diarrhea, constipation, pain near the anus) TENDERNESS IN MOUTH AND THROAT WITH OR WITHOUT PRESENCE OF ULCERS (sore throat, sores in mouth, or a toothache) UNUSUAL RASH, SWELLING OR PAIN  UNUSUAL VAGINAL DISCHARGE OR ITCHING   Items with * indicate a potential emergency and should be followed up as soon as possible or go to the Emergency Department if any problems should occur.  Please show the CHEMOTHERAPY ALERT CARD or IMMUNOTHERAPY ALERT CARD at  check-in to the Emergency Department and triage nurse.  Should you have questions after your visit or need to cancel or reschedule your appointment, please contact [facility] [phone]  and follow the prompts.  Office hours are 8:00 a.m. to 4:30 p.m. Monday - Friday. Please note that voicemails left after 4:00 p.m. may not be returned until the following business day.  We are closed weekends and major holidays. You have access to a nurse at all times for urgent questions. Please call the main number to the clinic [phone] and follow the prompts.  For any non-urgent questions, you may also contact your provider using MyChart. We now offer e-Visits for anyone 33 and older to request care online for non-urgent symptoms. For details visit [email or web address].   Also download the MyChart app! Go to the app store, search "MyChart", open the app, select [facility], and log in with your MyChart username and password.

## 2022-11-14 ENCOUNTER — Inpatient Hospital Stay: Payer: Medicaid Other

## 2022-11-14 VITALS — BP 127/81 | HR 81 | Temp 97.0°F | Resp 18

## 2022-11-14 DIAGNOSIS — Z5112 Encounter for antineoplastic immunotherapy: Secondary | ICD-10-CM | POA: Diagnosis not present

## 2022-11-14 DIAGNOSIS — I509 Heart failure, unspecified: Secondary | ICD-10-CM | POA: Diagnosis not present

## 2022-11-14 DIAGNOSIS — D500 Iron deficiency anemia secondary to blood loss (chronic): Secondary | ICD-10-CM

## 2022-11-14 DIAGNOSIS — F17210 Nicotine dependence, cigarettes, uncomplicated: Secondary | ICD-10-CM | POA: Diagnosis not present

## 2022-11-14 DIAGNOSIS — C786 Secondary malignant neoplasm of retroperitoneum and peritoneum: Secondary | ICD-10-CM | POA: Diagnosis not present

## 2022-11-14 DIAGNOSIS — Z801 Family history of malignant neoplasm of trachea, bronchus and lung: Secondary | ICD-10-CM | POA: Diagnosis not present

## 2022-11-14 DIAGNOSIS — C7801 Secondary malignant neoplasm of right lung: Secondary | ICD-10-CM | POA: Diagnosis not present

## 2022-11-14 DIAGNOSIS — R109 Unspecified abdominal pain: Secondary | ICD-10-CM | POA: Diagnosis not present

## 2022-11-14 DIAGNOSIS — I110 Hypertensive heart disease with heart failure: Secondary | ICD-10-CM | POA: Diagnosis not present

## 2022-11-14 DIAGNOSIS — C7802 Secondary malignant neoplasm of left lung: Secondary | ICD-10-CM | POA: Diagnosis not present

## 2022-11-14 DIAGNOSIS — C787 Secondary malignant neoplasm of liver and intrahepatic bile duct: Secondary | ICD-10-CM | POA: Diagnosis not present

## 2022-11-14 DIAGNOSIS — Z5111 Encounter for antineoplastic chemotherapy: Secondary | ICD-10-CM | POA: Diagnosis not present

## 2022-11-14 DIAGNOSIS — C539 Malignant neoplasm of cervix uteri, unspecified: Secondary | ICD-10-CM | POA: Diagnosis not present

## 2022-11-14 MED ORDER — SODIUM CHLORIDE 0.9% FLUSH
10.0000 mL | Freq: Once | INTRAVENOUS | Status: AC | PRN
  Administered 2022-11-14: 10 mL

## 2022-11-14 MED ORDER — HEPARIN SOD (PORK) LOCK FLUSH 100 UNIT/ML IV SOLN
500.0000 [IU] | Freq: Once | INTRAVENOUS | Status: AC | PRN
  Administered 2022-11-14: 500 [IU]

## 2022-11-14 MED ORDER — POTASSIUM CHLORIDE IN NACL 20-0.9 MEQ/L-% IV SOLN
Freq: Once | INTRAVENOUS | Status: AC
  Filled 2022-11-14: qty 1000

## 2022-11-14 MED ORDER — MAGNESIUM SULFATE 2 GM/50ML IV SOLN
2.0000 g | Freq: Once | INTRAVENOUS | Status: AC
  Administered 2022-11-14: 2 g via INTRAVENOUS
  Filled 2022-11-14: qty 50

## 2022-11-14 NOTE — Patient Instructions (Signed)
[facility]  Discharge Instructions: Thank you for choosing [facility] to provide your oncology and hematology care.  If you have a lab appointment with the Cancer Center, please come in thru the Main Entrance and check in at the main information desk.  Wear comfortable clothing and clothing appropriate for easy access to any Portacath or PICC line.   We strive to give you quality time with your provider. You may need to reschedule your appointment if you arrive late (15 or more minutes).  Arriving late affects you and other patients whose appointments are after yours.  Also, if you miss three or more appointments without notifying the office, you may be dismissed from the clinic at the provider's discretion.      For prescription refill requests, have your pharmacy contact our office and allow 72 hours for refills to be completed.    Today you received house fluids   BELOW ARE SYMPTOMS THAT SHOULD BE REPORTED IMMEDIATELY: *FEVER GREATER THAN 100.4 F (38 C) OR HIGHER *CHILLS OR SWEATING *NAUSEA AND VOMITING THAT IS NOT CONTROLLED WITH YOUR NAUSEA MEDICATION *UNUSUAL SHORTNESS OF BREATH *UNUSUAL BRUISING OR BLEEDING *URINARY PROBLEMS (pain or burning when urinating, or frequent urination) *BOWEL PROBLEMS (unusual diarrhea, constipation, pain near the anus) TENDERNESS IN MOUTH AND THROAT WITH OR WITHOUT PRESENCE OF ULCERS (sore throat, sores in mouth, or a toothache) UNUSUAL RASH, SWELLING OR PAIN  UNUSUAL VAGINAL DISCHARGE OR ITCHING   Items with * indicate a potential emergency and should be followed up as soon as possible or go to the Emergency Department if any problems should occur.  Please show the CHEMOTHERAPY ALERT CARD or IMMUNOTHERAPY ALERT CARD at check-in to the Emergency Department and triage nurse.  Should you have questions after your visit or need to cancel or reschedule your appointment, please contact [facility]  [phone]  and follow the prompts.  Office hours are 8:00 a.m. to 4:30 p.m. Monday - Friday. Please note that voicemails left after 4:00 p.m. may not be returned until the following business day.  We are closed weekends and major holidays. You have access to a nurse at all times for urgent questions. Please call the main number to the clinic [phone] and follow the prompts.  For any non-urgent questions, you may also contact your provider using MyChart. We now offer e-Visits for anyone 18 and older to request care online for non-urgent symptoms. For details visit [email or web address].   Also download the MyChart app! Go to the app store, search "MyChart", open the app, select [employer], and log in with your MyChart username and password.   

## 2022-11-14 NOTE — Progress Notes (Signed)
Pt presents today for house fluids and per provider's order. Vital signs stable and pt voiced no new complaints at this time.  House fluids given today per MD orders. Tolerated infusion without adverse affects. Vital signs stable. No complaints at this time. Discharged from clinic ambulatory in stable condition. Alert and oriented x 3. F/U with [facility] as scheduled.

## 2022-11-18 ENCOUNTER — Encounter: Payer: Self-pay | Admitting: Internal Medicine

## 2022-11-18 ENCOUNTER — Ambulatory Visit (INDEPENDENT_AMBULATORY_CARE_PROVIDER_SITE_OTHER): Payer: Medicaid Other | Admitting: Internal Medicine

## 2022-11-18 VITALS — BP 134/78 | HR 110 | Ht 66.0 in | Wt 182.2 lb

## 2022-11-18 DIAGNOSIS — F329 Major depressive disorder, single episode, unspecified: Secondary | ICD-10-CM | POA: Insufficient documentation

## 2022-11-18 DIAGNOSIS — C538 Malignant neoplasm of overlapping sites of cervix uteri: Secondary | ICD-10-CM

## 2022-11-18 DIAGNOSIS — F322 Major depressive disorder, single episode, severe without psychotic features: Secondary | ICD-10-CM

## 2022-11-18 DIAGNOSIS — L309 Dermatitis, unspecified: Secondary | ICD-10-CM | POA: Diagnosis not present

## 2022-11-18 DIAGNOSIS — Z0001 Encounter for general adult medical examination with abnormal findings: Secondary | ICD-10-CM | POA: Insufficient documentation

## 2022-11-18 DIAGNOSIS — J449 Chronic obstructive pulmonary disease, unspecified: Secondary | ICD-10-CM

## 2022-11-18 DIAGNOSIS — K219 Gastro-esophageal reflux disease without esophagitis: Secondary | ICD-10-CM | POA: Insufficient documentation

## 2022-11-18 DIAGNOSIS — F17219 Nicotine dependence, cigarettes, with unspecified nicotine-induced disorders: Secondary | ICD-10-CM

## 2022-11-18 DIAGNOSIS — I10 Essential (primary) hypertension: Secondary | ICD-10-CM

## 2022-11-18 MED ORDER — ESCITALOPRAM OXALATE 10 MG PO TABS: 10.0000 mg | ORAL_TABLET | Freq: Every day | ORAL | 2 refills | Status: DC

## 2022-11-18 MED ORDER — TRIAMCINOLONE ACETONIDE 0.1 % EX CREA: 1.0000 | TOPICAL_CREAM | Freq: Two times a day (BID) | CUTANEOUS | 0 refills | Status: DC

## 2022-11-18 MED ORDER — ALBUTEROL SULFATE HFA 108 (90 BASE) MCG/ACT IN AERS: 2.0000 | INHALATION_SPRAY | Freq: Four times a day (QID) | RESPIRATORY_TRACT | 2 refills | Status: DC | PRN

## 2022-11-18 MED ORDER — NICOTINE POLACRILEX 4 MG MT GUM: 4.0000 mg | CHEWING_GUM | OROMUCOSAL | 0 refills | Status: DC | PRN

## 2022-11-18 NOTE — Assessment & Plan Note (Signed)
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  She is followed by oncology (Dr. Delton Coombes) and is currently undergoing treatment with Morene Rankins, Taxol, and Cisplatin.  Last treatment completed 3/14. -Oncology follow-up scheduled for 4/3

## 2022-11-18 NOTE — Assessment & Plan Note (Signed)
Her acute concern today is a rash present on the posterior aspect of her right knee.  The appearance is most similar to dermatitis. -Triamcinolone cream prescribed today for twice daily application

## 2022-11-18 NOTE — Assessment & Plan Note (Signed)
Tearful during today's encounter.  PHQ-9 score 12.  Denies SI/HI.  She attributes her depressed mood to her cancer diagnosis and perceived lack of support from family/friends.  She is interested in starting antidepressant therapy today. -Start Lexapro 10 mg daily -Follow-up in 4 weeks for reassessment

## 2022-11-18 NOTE — Assessment & Plan Note (Signed)
Presenting today to establish care.  Recent records and labs been reviewed. -Recent labs reviewed.  No indication for repeat labs today -Vaccines currently up-to-date -We will tentatively plan for follow-up in 1 month

## 2022-11-18 NOTE — Assessment & Plan Note (Signed)
Currently smokes 6 cigarettes/day and has been smoking since age 52.  She is interested in cessation and would like to try nicotine replacement therapy. -Nicorette gum prescribed today.  She was congratulated on taking the initial steps towards smoking cessation.

## 2022-11-18 NOTE — Assessment & Plan Note (Signed)
She is currently prescribed amlodipine 10 mg daily for treatment of hypertension.  BP today is 134/78. -No medication changes today

## 2022-11-18 NOTE — Patient Instructions (Signed)
It was a pleasure to see you today.  Thank you for giving [place] the opportunity to be involved in your care.  Below is a brief recap of your visit and next steps.  We will plan to see you again in 4 weeks.  Summary You have established care today I have prescribed Lexapro for depression and placed a referral for counseling services.  Nicorette gum has been prescribed to help with smoking cessation.  Triamcinolone ointment has been prescribed to help with the rash on your leg and back We will follow up in 4 weeks

## 2022-11-18 NOTE — Assessment & Plan Note (Signed)
Symptoms are currently well-controlled with Protonix 40 mg daily. -No medication changes today

## 2022-11-18 NOTE — Progress Notes (Signed)
New Patient Office Visit  Subjective    Patient ID: Kristin Carson, female    DOB: June 16, 1971  Age: 52 y.o. MRN: [id number]  CC:  Chief Complaint  Patient presents with   Establish Care   HPI Kristin Carson presents to establish care.  She is a 51 year old woman with a past medical history significant for metastatic cervical cancer (lungs, peritoneum, and liver), HTN, GERD, COPD, and current tobacco use.  She is currently followed by oncology (Dr. Delton Coombes) for management of metastatic cervical cancer.  Kristin Carson has multiple concerns to discuss today.  She states that she has felt depressed, weak, and has difficulty sleeping since starting chemotherapy.  She became tearful during our encounter when relaying recent life events.  She additionally endorses a rash on the posterior aspect of her right leg that she would like for me to evaluate.  Acute concerns, chronic medical conditions, and outstanding preventative care items discussed today are individually addressed in A/P below.  Outpatient Encounter Medications as of 11/18/2022  Medication Sig   amLODipine (NORVASC) 10 MG tablet Take 1 tablet (10 mg total) by mouth daily.   Atezolizumab (TECENTRIQ IV) Inject into the vein every 21 ( twenty-one) days.   Bevacizumab (AVASTIN IV) Inject into the vein every 21 ( twenty-one) days.   calcium carbonate (TUMS) 500 MG chewable tablet Chew 1 tablet (200 mg of elemental calcium total) by mouth 3 (three) times daily with meals.   CISPLATIN IV Inject into the vein every 21 ( twenty-one) days.   dexamethasone (DECADRON) 4 MG tablet Take 2 tablets daily x 3 days starting the day after cisplatin chemotherapy. Take with food.   escitalopram (LEXAPRO) 10 MG tablet Take 1 tablet (10 mg total) by mouth daily.   HYDROcodone-acetaminophen (NORCO/VICODIN) 5-325 MG tablet Take 1 tablet by mouth every 8 (eight) hours as needed for moderate pain.   lidocaine-prilocaine (EMLA) cream Apply a dime-sized amount to port a  cath site and cover with plastic wrap 1 hour prior to infusion appointments   LORazepam (ATIVAN) 0.5 MG tablet Take one tablet by mouth three hours prior to CT scan; take the second tablet by mouth upon arrival to radiology for procedure   nicotine polacrilex (NICORETTE) 4 MG gum Take 1 each (4 mg total) by mouth as needed for smoking cessation.   ondansetron (ZOFRAN) 8 MG tablet Take 1 tablet (8 mg total) by mouth every 8 (eight) hours as needed for nausea or vomiting. Start on the third day after cisplatin.   PACLITAXEL IV Inject into the vein every 21 ( twenty-one) days.   pantoprazole (PROTONIX) 40 MG tablet Take 1 tablet (40 mg total) by mouth daily.   prochlorperazine (COMPAZINE) 10 MG tablet Take 1 tablet (10 mg total) by mouth every 6 (six) hours as needed (Nausea or vomiting).   triamcinolone cream (KENALOG) 0.1 % Apply 1 Application topically 2 (two) times daily.   [DISCONTINUED] albuterol (VENTOLIN HFA) 108 (90 Base) MCG/ACT inhaler Inhale 2 puffs into the lungs every 6 (six) hours as needed for wheezing or shortness of breath.   albuterol (VENTOLIN HFA) 108 (90 Base) MCG/ACT inhaler Inhale 2 puffs into the lungs every 6 (six) hours as needed for wheezing or shortness of breath.   [DISCONTINUED] famotidine (PEPCID) 20 MG tablet Take 1 tablet (20 mg total) by mouth 2 (two) times daily. (Patient not taking: Reported on 01/12/2018)   [DISCONTINUED] hydrochlorothiazide (HYDRODIURIL) 25 MG tablet Take 1 tablet (25 mg total) by mouth daily.  No facility-administered encounter medications on file as of 11/18/2022.    Past Medical History:  Diagnosis Date   Anemia    Asthma    Bronchitis    Cancer (HCC)    Cervical   CHF (congestive heart failure) (HCC)    COPD (chronic obstructive pulmonary disease) (HCC)    Depression    GERD (gastroesophageal reflux disease)    Headache    Hypertension    Port-A-Cath in place 09/25/2022    Past Surgical History:  Procedure Laterality Date    BREAST SURGERY     CESAREAN SECTION     IR IMAGING GUIDED PORT INSERTION  09/30/2022   LEG SURGERY      Family History  Problem Relation Age of Onset   Dermatomyositis Mother    Hypertension Mother    Cancer Mother        lung   Cancer Father        lung   Heart disease Father    Lung cancer Paternal Uncle    Lung cancer Maternal Uncle    Colon cancer Neg Hx    Breast cancer Neg Hx    Ovarian cancer Neg Hx    Endometrial cancer Neg Hx    Pancreatic cancer Neg Hx    Prostate cancer Neg Hx     Social History   Socioeconomic History   Marital status: Married    Spouse name: Not on file   Number of children: Not on file   Years of education: Not on file   Highest education level: Not on file  Occupational History   Not on file  Tobacco Use   Smoking status: Every Day    Packs/day: 0.25    Years: 23.00    Additional pack years: 0.00    Total pack years: 5.75    Types: Cigarettes   Smokeless tobacco: Never  Vaping Use   Vaping Use: Never used  Substance and Sexual Activity   Alcohol use: Yes    Comment: occ   Drug use: Yes    Types: Marijuana   Sexual activity: Yes    Birth control/protection: None  Other Topics Concern   Not on file  Social History Narrative   Not on file   Social Determinants of Health   Financial Resource Strain: Low Risk  (09/09/2022)   Overall Financial Resource Strain (CARDIA)    Difficulty of Paying Living Expenses: Not hard at all  Food Insecurity: No Food Insecurity (09/22/2022)   Hunger Vital Sign    Worried About Running Out of Food in the Last Year: Never true    Ran Out of Food in the Last Year: Never true  Transportation Needs: No Transportation Needs (09/22/2022)   PRAPARE - Hydrologist (Medical): No    Lack of Transportation (Non-Medical): No  Recent Concern: Transportation Needs - Unmet Transportation Needs (09/09/2022)   PRAPARE - Transportation    Lack of Transportation (Medical): Yes    Lack  of Transportation (Non-Medical): Yes  Physical Activity: Insufficiently Active (09/09/2022)   Exercise Vital Sign    Days of Exercise per Week: 2 days    Minutes of Exercise per Session: 20 min  Stress: No Stress Concern Present (09/09/2022)   [place]    Feeling of Stress : Only a little  Social Connections: Socially Isolated (09/09/2022)   Social Connection and Isolation Panel [NHANES]    Frequency of Communication with Friends  and Family: Three times a week    Frequency of Social Gatherings with Friends and Family: More than three times a week    Attends Religious Services: Never    Marine scientist or Organizations: No    Attends Archivist Meetings: Never    Marital Status: Separated  Intimate Partner Violence: Not At Risk (09/22/2022)   Humiliation, Afraid, Rape, and Kick questionnaire    Fear of Current or Ex-Partner: No    Emotionally Abused: No    Physically Abused: No    Sexually Abused: No   Review of Systems  Constitutional:  Positive for malaise/fatigue.  Skin:  Positive for rash (R leg).  Psychiatric/Behavioral:  Positive for depression. The patient has insomnia.   All other systems reviewed and are negative.  Objective    BP 134/78   Pulse (!) 110   Ht 5\' 6"  (1.676 m)   Wt 182 lb 3.2 oz (82.6 kg)   SpO2 100%   BMI 29.41 kg/m   Physical Exam Vitals reviewed.  Constitutional:      General: She is not in acute distress.    Appearance: Normal appearance. She is not toxic-appearing.  HENT:     Head: Normocephalic and atraumatic.     Right Ear: External ear normal.     Left Ear: External ear normal.     Nose: Nose normal. No congestion or rhinorrhea.     Mouth/Throat:     Mouth: Mucous membranes are moist.     Pharynx: Oropharynx is clear. No oropharyngeal exudate or posterior oropharyngeal erythema.  Eyes:     General: No scleral icterus.    Extraocular Movements: Extraocular  movements intact.     Conjunctiva/sclera: Conjunctivae normal.     Pupils: Pupils are equal, round, and reactive to light.  Cardiovascular:     Rate and Rhythm: Normal rate and regular rhythm.     Pulses: Normal pulses.     Heart sounds: Normal heart sounds. No murmur heard.    No friction rub. No gallop.  Pulmonary:     Effort: Pulmonary effort is normal.     Breath sounds: Normal breath sounds. No wheezing, rhonchi or rales.  Abdominal:     General: Abdomen is flat. Bowel sounds are normal. There is no distension.     Palpations: Abdomen is soft.     Tenderness: There is no abdominal tenderness.  Musculoskeletal:        General: No swelling. Normal range of motion.     Cervical back: Normal range of motion.     Right lower leg: No edema.     Left lower leg: No edema.  Lymphadenopathy:     Cervical: No cervical adenopathy.  Skin:    General: Skin is warm and dry.     Capillary Refill: Capillary refill takes less than 2 seconds.     Coloration: Skin is not jaundiced.     Findings: Rash (dermatitis present on R posterior knee) present.     Comments: Port-a-cath in R chest wall  Neurological:     General: No focal deficit present.     Mental Status: She is alert and oriented to person, place, and time.  Psychiatric:     Comments: Tearful during encounter   Last CBC Lab Results  Component Value Date   WBC 8.8 11/12/2022   HGB 9.8 (L) 11/12/2022   HCT 30.5 (L) 11/12/2022   MCV 85.9 11/12/2022   MCH 27.6 11/12/2022   RDW 25.1 (  H) 11/12/2022   PLT 233 [id number]   Last metabolic panel Lab Results  Component Value Date   GLUCOSE 95 11/12/2022   NA 132 (L) 11/12/2022   K 3.7 11/12/2022   CL 99 11/12/2022   CO2 22 11/12/2022   BUN 9 11/12/2022   CREATININE 1.16 (H) 11/12/2022   GFRNONAA 58 (L) 11/12/2022   CALCIUM 8.4 (L) 11/12/2022   PROT 6.6 11/12/2022   ALBUMIN 3.0 (L) 11/12/2022   BILITOT 0.7 11/12/2022   ALKPHOS 71 11/12/2022   AST 17 11/12/2022   ALT 10  11/12/2022   ANIONGAP 11 11/12/2022   Last lipids Lab Results  Component Value Date   CHOL 155 11/25/2016   HDL 55 11/25/2016   LDLCALC 87 11/25/2016   TRIG 63 11/25/2016   CHOLHDL 2.8 11/25/2016   Last hemoglobin A1c Lab Results  Component Value Date   HGBA1C 5.3 08/29/2022   Last thyroid functions Lab Results  Component Value Date   TSH 0.901 10/24/2022    Assessment & Plan:   Problem List Items Addressed This Visit       Essential hypertension    She is currently prescribed amlodipine 10 mg daily for treatment of hypertension.  BP today is 134/78. -No medication changes today      Chronic obstructive pulmonary disease (HCC)    Currently prescribed an albuterol inhaler for as needed use.  Asymptomatic currently.  Pulmonary exam today is unremarkable. -Albuterol inhaler refilled      GERD (gastroesophageal reflux disease)    Symptoms are currently well-controlled with Protonix 40 mg daily. -No medication changes today      Cigarette nicotine dependence with nicotine-induced disorder    Currently smokes 6 cigarettes/day and has been smoking since age 33.  She is interested in cessation and would like to try nicotine replacement therapy. -Nicorette gum prescribed today.  She was congratulated on taking the initial steps towards smoking cessation.      Dermatitis    Her acute concern today is a rash present on the posterior aspect of her right knee.  The appearance is most similar to dermatitis. 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  She is followed by oncology (Dr. Delton Coombes) and is currently undergoing treatment with Morene Rankins, Taxol, and Cisplatin.  Last treatment completed 3/14. -Oncology follow-up scheduled for 4/3      MDD (major depressive disorder) - Primary    Tearful during  today's encounter.  PHQ-9 score 12.  Denies SI/HI.  She attributes her depressed mood to her cancer diagnosis and perceived lack of support from family/friends.  She is interested in starting antidepressant therapy today. -Start Lexapro 10 mg daily -Referral placed to [facility] for counseling services -Follow-up in 4 weeks for reassessment      Encounter for general adult medical examination with abnormal findings    Presenting today to establish care.  Recent records and labs been reviewed. -Recent labs reviewed.  No indication for repeat labs today -Vaccines currently up-to-date -We will tentatively plan for follow-up in 1 month       Return in about 4 weeks (around 12/16/2022).   Johnette Abraham, MD

## 2022-11-18 NOTE — Assessment & Plan Note (Signed)
Currently prescribed an albuterol inhaler for as needed use.  Asymptomatic currently.  Pulmonary exam today is unremarkable. -Albuterol inhaler refilled

## 2022-12-01 DIAGNOSIS — Z419 Encounter for procedure for purposes other than remedying health state, unspecified: Secondary | ICD-10-CM | POA: Diagnosis not present

## 2022-12-02 NOTE — Progress Notes (Signed)
[facility] [address] [place] [address]    Clinic Day:  12/03/2022  Referring physician: Derek Jack, MD  Patient Care Team: Johnette Abraham, MD as PCP - General (Internal Medicine) Derek Jack, MD as Medical Oncologist (Medical Oncology) Brien Mates, RN as Oncology Nurse Navigator (Medical Oncology)   ASSESSMENT & PLAN:   Assessment: 1.  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 - CTAP (08/30/2022): Irregularity of the endometrium extending through the cervix with foci of gas in the cervix.  Multifocal omental and peritoneal metastasis.  Metastatic left periaortic, aortocaval, left external iliac and right common iliac lymph nodes.  Multiple bilateral lung nodules at bases.  12 mm hypodensity in the liver near the falciform ligament. - Endometrial biopsy (09/09/2022): Invasive moderately to poorly differentiated squamous cell carcinoma with abundant necrosis.  Cervix biopsy: Invasive and in situ moderate to poorly differentiated squamous cell carcinoma.  High risk HPV positive. - NGS: PD-L1 (22 C3): CPS 2, HER2 IHC negative.  PIK3CA exon 10 pathogenic variant.  MS-stable.  TMB-low.  FANCM and SDHA pathogenic variants. - PD-L1 CPS: 8% - Cycle 1 of cisplatin, paclitaxel, bevacizumab on 10/02/2022, pembrolizumab added with cycle 2 on 10/23/2022 - Bone scan (10/14/2022): No metastatic disease   2.  Social/family history: - She is married and lives at home with her husband and roommate.  She has never worked.  She is current active smoker, half pack per day, for the last 28 years.  She occasionally smokes marijuana. - Mother died of lung cancer.  Father had head and neck cancer.  Maternal grandmother had cancer.  Paternal uncle died of cancer.  Maternal uncle also had  cancer.   Plan: 1.  Metastatic cervical cancer to the lungs, peritoneum and liver: - Cycle 3 of chemoimmunotherapy with Keytruda on 11/13/2022. - She has tolerated last cycle of treatment reasonably well. - Reviewed labs today: LFTs normal.  Creatinine 1.18 and stable.  CBC grossly normal.  TSH 2.4. - Proceed with cycle 4 tomorrow.  RTC 3 weeks for follow-up.  Will plan to repeat CT CAP with contrast prior to next visit.   2.  Normocytic anemia: - Feraheme on 10/13/2022 on 10/20/2022.  Hemoglobin today improved to 10.7.   3.  Generalized skin rash with itching: - Cycle 3 of chemotherapy was on 11/13/2022.  She developed generalized itching 2 to 3 days after last chemotherapy.  She was seen by Dr. Doren Custard on 11/18/2022 and was given triamcinolone steroid cream.  She reports that the cream has helped some. - Today skin examination shows dry skin with healed rash all over the body.  Most likely rash induced by Keytruda. - Will give Atarax to take at bedtime as she has more itching at night. - Will start her on prednisone 5 mg daily.  If rash improves, she may discontinue it in 7 days.   4.  Hypertension: - Continue amlodipine 10 mg daily.  Blood pressure today is 158/85.  Orders Placed This Encounter  Procedures   CT CHEST ABDOMEN PELVIS W CONTRAST    Standing Status:   Future    Standing Expiration Date:   12/02/2023    Order Specific Question:   If indicated for the ordered procedure, I authorize the administration of contrast media per Radiology protocol    Answer:  Yes    Order Specific Question:   Does the patient have a contrast media/X-ray dye allergy?    Answer:   No    Order Specific Question:   Is patient pregnant?    Answer:   No    Order Specific Question:   Preferred imaging location?    Answer:   [facility]    Order Specific Question:   Release to patient    Answer:   Immediate    Order Specific Question:   Is Oral Contrast requested for this exam?    Answer:   Yes,  Per Radiology protocol      I,Katie Daubenspeck,acting as a scribe for Derek Jack, MD.,have documented all relevant documentation on the behalf of Derek Jack, MD,as directed by  Derek Jack, MD while in the presence of Derek Jack, MD.   I, Derek Jack MD, have reviewed the above documentation for accuracy and completeness, and I agree with the above.   Derek Jack, MD   4/3/20244:52 PM  CHIEF COMPLAINT:   Diagnosis: metastatic cervical squamous cell carcinoma   Cancer Staging  Cervical cancer Staging form: Cervix Uteri, AJCC Version 9 - Clinical stage from 09/19/2022: FIGO Stage IVB (cT4, cN2a, cM1) - Unsigned    Prior Therapy: none  Current Therapy:  Cisplatin + Paclitaxel + Bevacizumab    HISTORY OF PRESENT ILLNESS:   Oncology History Overview Note  PD-L1 CPS 8%   Cervical cancer  09/19/2022 Initial Diagnosis   Cervical cancer ([place])   10/02/2022 -  Chemotherapy   [name]        INTERVAL HISTORY:   Kristin Carson is a 52 y.o. female presenting to clinic today for follow up of metastatic cervical squamous cell carcinoma. She was last seen by me on 11/12/22.  Today, she states that she is doing well overall. Her appetite level is at 10%. Her energy level is at 10%.  PAST MEDICAL HISTORY:   Past Medical History: Past Medical History:  Diagnosis Date   Anemia    Asthma    Bronchitis    Cancer    Cervical   CHF (congestive heart failure)    COPD (chronic obstructive pulmonary disease)    Depression    GERD (gastroesophageal reflux disease)    Headache    Hypertension    Port-A-Cath in place 09/25/2022    Surgical History: Past Surgical History:  Procedure Laterality Date   BREAST SURGERY     CESAREAN SECTION     IR IMAGING GUIDED PORT INSERTION  09/30/2022   LEG SURGERY      Social History: Social  History   Socioeconomic History   Marital status: Married    Spouse name: Not on file   Number of children: Not on file   Years of education: Not on file   Highest education level: Not on file  Occupational History   Not on file  Tobacco Use   Smoking status: Every Day    Packs/day: 0.25    Years: 23.00    Additional pack years: 0.00    Total pack years: 5.75    Types: Cigarettes   Smokeless tobacco: Never  Vaping Use   Vaping Use: Never used  Substance and Sexual Activity   Alcohol use: Yes    Comment: occ   Drug use: Yes    Types: Marijuana   Sexual activity: Yes  Birth control/protection: None  Other Topics Concern   Not on file  Social History Narrative   Not on file   Social Determinants of Health   Financial Resource Strain: Low Risk  (09/09/2022)   Overall Financial Resource Strain (CARDIA)    Difficulty of Paying Living Expenses: Not hard at all  Food Insecurity: No Food Insecurity (09/22/2022)   Hunger Vital Sign    Worried About Running Out of Food in the Last Year: Never true    Ran Out of Food in the Last Year: Never true  Transportation Needs: No Transportation Needs (09/22/2022)   PRAPARE - Hydrologist (Medical): No    Lack of Transportation (Non-Medical): No  Recent Concern: Transportation Needs - Unmet Transportation Needs (09/09/2022)   PRAPARE - Transportation    Lack of Transportation (Medical): Yes    Lack of Transportation (Non-Medical): Yes  Physical Activity: Insufficiently Active (09/09/2022)   Exercise Vital Sign    Days of Exercise per Week: 2 days    Minutes of Exercise per Session: 20 min  Stress: No Stress Concern Present (09/09/2022)   [place]    Feeling of Stress : Only a little  Social Connections: Socially Isolated (09/09/2022)   Social Connection and Isolation Panel [NHANES]    Frequency of Communication with Friends and Family: Three  times a week    Frequency of Social Gatherings with Friends and Family: More than three times a week    Attends Religious Services: Never    Marine scientist or Organizations: No    Attends Archivist Meetings: Never    Marital Status: Separated  Intimate Partner Violence: Not At Risk (09/22/2022)   Humiliation, Afraid, Rape, and Kick questionnaire    Fear of Current or Ex-Partner: No    Emotionally Abused: No    Physically Abused: No    Sexually Abused: No    Family History: Family History  Problem Relation Age of Onset   Dermatomyositis Mother    Hypertension Mother    Cancer Mother        lung   Cancer Father        lung   Heart disease Father    Lung cancer Paternal Uncle    Lung cancer Maternal Uncle    Colon cancer Neg Hx    Breast cancer Neg Hx    Ovarian cancer Neg Hx    Endometrial cancer Neg Hx    Pancreatic cancer Neg Hx    Prostate cancer Neg Hx     Current Medications:  Current Outpatient Medications:    albuterol (VENTOLIN HFA) 108 (90 Base) MCG/ACT inhaler, Inhale 2 puffs into the lungs every 6 (six) hours as needed for wheezing or shortness of breath., Disp: 8 g, Rfl: 2   amLODipine (NORVASC) 10 MG tablet, Take 1 tablet (10 mg total) by mouth daily., Disp: 30 tablet, Rfl: 2   Atezolizumab (TECENTRIQ IV), Inject into the vein every 21 ( twenty-one) days., Disp: , Rfl:    Bevacizumab (AVASTIN IV), Inject into the vein every 21 ( twenty-one) days., Disp: , Rfl:    calcium carbonate (TUMS) 500 MG chewable tablet, Chew 1 tablet (200 mg of elemental calcium total) by mouth 3 (three) times daily with meals., Disp: 60 tablet, Rfl: 3   CISPLATIN IV, Inject into the vein every 21 ( twenty-one) days., Disp: , Rfl:    dexamethasone (DECADRON) 4 MG tablet, Take 2  tablets daily x 3 days starting the day after cisplatin chemotherapy. Take with food., Disp: 30 tablet, Rfl: 1   escitalopram (LEXAPRO) 10 MG tablet, Take 1 tablet (10 mg total) by mouth daily.,  Disp: 30 tablet, Rfl: 2   HYDROcodone-acetaminophen (NORCO/VICODIN) 5-325 MG tablet, Take 1 tablet by mouth every 8 (eight) hours as needed for moderate pain., Disp: 60 tablet, Rfl: 0   hydrOXYzine (ATARAX) 10 MG tablet, Take 1 tablet (10 mg total) by mouth 3 (three) times daily as needed., Disp: 30 tablet, Rfl: 0   lidocaine-prilocaine (EMLA) cream, Apply a dime-sized amount to port a cath site and cover with plastic wrap 1 hour prior to infusion appointments, Disp: 30 g, Rfl: 3   LORazepam (ATIVAN) 0.5 MG tablet, Take one tablet by mouth three hours prior to CT scan; take the second tablet by mouth upon arrival to radiology for procedure, Disp: 2 tablet, Rfl: 0   nicotine polacrilex (NICORETTE) 4 MG gum, Take 1 each (4 mg total) by mouth as needed for smoking cessation., Disp: 100 tablet, Rfl: 0   ondansetron (ZOFRAN) 8 MG tablet, Take 1 tablet (8 mg total) by mouth every 8 (eight) hours as needed for nausea or vomiting. Start on the third day after cisplatin., Disp: 30 tablet, Rfl: 1   PACLITAXEL IV, Inject into the vein every 21 ( twenty-one) days., Disp: , Rfl:    pantoprazole (PROTONIX) 40 MG tablet, Take 1 tablet (40 mg total) by mouth daily., Disp: 90 tablet, Rfl: 1   prednisoLONE 5 MG TABS tablet, Take 1 tablet (5 mg total) by mouth daily., Disp: 30 tablet, Rfl: 0   prochlorperazine (COMPAZINE) 10 MG tablet, Take 1 tablet (10 mg total) by mouth every 6 (six) hours as needed (Nausea or vomiting)., Disp: 30 tablet, Rfl: 1   triamcinolone cream (KENALOG) 0.1 %, Apply 1 Application topically 2 (two) times daily., Disp: 30 g, Rfl: 0   Allergies: Allergies  Allergen Reactions   Carrot [Daucus Carota] Hives   Lisinopril-Hydrochlorothiazide     Oral swelling   Ibuprofen Other (See Comments)    Oral swelling   Tramadol Hives   Erythromycin Hives    REVIEW OF SYSTEMS:   Review of Systems  Constitutional:  Negative for chills, fatigue and fever.  HENT:   Negative for lump/mass, mouth  sores, nosebleeds, sore throat and trouble swallowing.   Eyes:  Negative for eye problems.  Respiratory:  Negative for cough and shortness of breath.   Cardiovascular:  Negative for chest pain, leg swelling and palpitations.  Gastrointestinal:  Negative for abdominal pain, constipation, diarrhea, nausea and vomiting.  Genitourinary:  Negative for bladder incontinence, difficulty urinating, dysuria, frequency, hematuria and nocturia.   Musculoskeletal:  Negative for arthralgias, back pain, flank pain, myalgias and neck pain.  Skin:  Positive for itching and rash.  Neurological:  Negative for dizziness, headaches and numbness.  Hematological:  Does not bruise/bleed easily.  Psychiatric/Behavioral:  Negative for depression, sleep disturbance and suicidal ideas. The patient is not nervous/anxious.   All other systems reviewed and are negative.    VITALS:   There were no vitals taken for this visit.  Wt Readings from Last 3 Encounters:  12/03/22 180 lb 9.6 oz (81.9 kg)  11/18/22 182 lb 3.2 oz (82.6 kg)  11/13/22 190 lb 3.2 oz (86.3 kg)    There is no height or weight on file to calculate BMI.  Performance status (ECOG): 1 - Symptomatic but completely ambulatory  PHYSICAL EXAM:  Physical Exam Vitals and nursing note reviewed. Exam conducted with a chaperone present.  Constitutional:      Appearance: Normal appearance.  Cardiovascular:     Rate and Rhythm: Normal rate and regular rhythm.     Pulses: Normal pulses.     Heart sounds: Normal heart sounds.  Pulmonary:     Effort: Pulmonary effort is normal.     Breath sounds: Normal breath sounds.  Abdominal:     Palpations: Abdomen is soft. There is no hepatomegaly, splenomegaly or mass.     Tenderness: There is no abdominal tenderness.  Musculoskeletal:     Right lower leg: No edema.     Left lower leg: No edema.  Lymphadenopathy:     Cervical: No cervical adenopathy.     Right cervical: No superficial, deep or posterior  cervical adenopathy.    Left cervical: No superficial, deep or posterior cervical adenopathy.     Upper Body:     Right upper body: No supraclavicular or axillary adenopathy.     Left upper body: No supraclavicular or axillary adenopathy.  Skin:    Comments: Generalized fading rash predominantly on the extremities and upper back.  Neurological:     General: No focal deficit present.     Mental Status: She is alert and oriented to person, place, and time.  Psychiatric:        Mood and Affect: Mood normal.        Behavior: Behavior normal.     LABS:      Latest Ref Rng & Units 12/03/2022    1:41 PM 11/12/2022    2:27 PM 10/23/2022    8:04 AM  CBC  WBC 4.0 - 10.5 K/uL 8.7  8.8  9.4   Hemoglobin 12.0 - 15.0 g/dL 10.7  9.8  9.4   Hematocrit 36.0 - 46.0 % 33.3  30.5  29.8   Platelets 150 - 400 K/uL 258  233  240       Latest Ref Rng & Units 12/03/2022    1:41 PM 11/12/2022    2:27 PM 10/23/2022    8:04 AM  CMP  Glucose 70 - 99 mg/dL 97  95  92   BUN 6 - 20 mg/dL 9  9  14    Creatinine 0.44 - 1.00 mg/dL 1.18  1.16  1.01   Sodium 135 - 145 mmol/L 134  132  132   Potassium 3.5 - 5.1 mmol/L 3.7  3.7  3.7   Chloride 98 - 111 mmol/L 100  99  100   CO2 22 - 32 mmol/L 23  22  23    Calcium 8.9 - 10.3 mg/dL 8.6  8.4  8.5   Total Protein 6.5 - 8.1 g/dL 6.6  6.6  6.6   Total Bilirubin 0.3 - 1.2 mg/dL 0.4  0.7  0.3   Alkaline Phos 38 - 126 U/L 77  71  51   AST 15 - 41 U/L 23  17  16    ALT 0 - 44 U/L 13  10  9       No results found for: "CEA1", "CEA" / No results found for: "CEA1", "CEA" No results found for: "PSA1" No results found for: "[id number]" No results found for: "CAN125"  No results found for: "TOTALPROTELP", "ALBUMINELP", "A1GS", "A2GS", "BETS", "BETA2SER", "GAMS", "MSPIKE", "SPEI" Lab Results  Component Value Date   TIBC 412 09/22/2022   FERRITIN 35 09/22/2022   IRONPCTSAT 7 (L) 09/22/2022   No results found for: "LDH"  STUDIES:   CT Chest W Contrast  Result Date:  11/10/2022 CLINICAL DATA:  Metastatic squamous cell carcinoma of the cervix. Chest staging. * Tracking Code: BO * EXAM: CT CHEST WITH CONTRAST TECHNIQUE: Multidetector CT imaging of the chest was performed during intravenous contrast administration. RADIATION DOSE REDUCTION: This exam was performed according to the departmental dose-optimization program which includes automated exposure control, adjustment of the mA and/or kV according to patient size and/or use of iterative reconstruction technique. CONTRAST:  55mL OMNIPAQUE IOHEXOL 300 MG/ML  SOLN COMPARISON:  08/30/2022 CT abdomen/pelvis. 08/28/2022 chest radiograph. 01/12/2018 chest CT angiogram. FINDINGS: Cardiovascular: Normal heart size. No significant pericardial effusion/thickening. Left anterior descending coronary atherosclerosis. Right internal jugular Port-A-Cath terminates at the cavoatrial junction. Atherosclerotic nonaneurysmal thoracic aorta. Normal caliber pulmonary arteries. No central pulmonary emboli. Mediastinum/Nodes: No significant thyroid nodules. Unremarkable esophagus. No right axillary adenopathy. Borderline prominent left axillary lymph nodes up to 1.0 cm short axis diameter (series 2/image 34), not substantially changed from 01/12/2018 CT. No pathologically enlarged mediastinal or hilar lymph nodes. Lungs/Pleura: No pneumothorax. No pleural effusion. Mild centrilobular emphysema with diffuse bronchial wall thickening. No acute consolidative airspace disease. Several solid pulmonary nodules scattered at both lung bases, all decreased mildly from 08/30/2022 CT abdomen study. Representative 1.0 cm peripheral lingular nodule (series 4/image 77), previously 1.2 cm. Representative 0.9 cm right middle lobe nodule (series 4/image 93), previously 1.2 cm. Representative peripheral lingula 0.7 cm nodule (series 4/image 66), previously 1.1 cm. No new or enlarging nodules. Upper abdomen: No acute abnormality. Musculoskeletal:  No aggressive  appearing focal osseous lesions. IMPRESSION: 1. Several solid pulmonary nodules scattered at both lung bases, all decreased mildly from 08/30/2022 CT abdomen study, compatible with positive response to therapy of pulmonary metastases. 2. No new or progressive metastatic disease in the chest. 3. Borderline prominent left axillary lymph nodes, not substantially changed from 01/12/2018 CT, nonspecific, potentially reactive. 4. One vessel coronary atherosclerosis. 5. Mild centrilobular emphysema with diffuse bronchial wall thickening, compatible with the reported history of COPD. 6. Aortic Atherosclerosis (ICD10-I70.0) and Emphysema (ICD10-J43.9). Electronically Signed   By: Ilona Sorrel M.D.   On: 11/10/2022 15:03

## 2022-12-03 ENCOUNTER — Inpatient Hospital Stay: Payer: Medicaid Other | Attending: Hematology

## 2022-12-03 ENCOUNTER — Encounter: Payer: Self-pay | Admitting: Hematology

## 2022-12-03 ENCOUNTER — Inpatient Hospital Stay (HOSPITAL_BASED_OUTPATIENT_CLINIC_OR_DEPARTMENT_OTHER): Payer: Medicaid Other | Admitting: Hematology

## 2022-12-03 DIAGNOSIS — F17210 Nicotine dependence, cigarettes, uncomplicated: Secondary | ICD-10-CM | POA: Insufficient documentation

## 2022-12-03 DIAGNOSIS — C539 Malignant neoplasm of cervix uteri, unspecified: Secondary | ICD-10-CM | POA: Diagnosis not present

## 2022-12-03 DIAGNOSIS — Z801 Family history of malignant neoplasm of trachea, bronchus and lung: Secondary | ICD-10-CM | POA: Insufficient documentation

## 2022-12-03 DIAGNOSIS — Z79620 Long term (current) use of immunosuppressive biologic: Secondary | ICD-10-CM | POA: Insufficient documentation

## 2022-12-03 DIAGNOSIS — R21 Rash and other nonspecific skin eruption: Secondary | ICD-10-CM | POA: Diagnosis not present

## 2022-12-03 DIAGNOSIS — I509 Heart failure, unspecified: Secondary | ICD-10-CM | POA: Insufficient documentation

## 2022-12-03 DIAGNOSIS — Z79630 Long term (current) use of alkylating agent: Secondary | ICD-10-CM | POA: Insufficient documentation

## 2022-12-03 DIAGNOSIS — C786 Secondary malignant neoplasm of retroperitoneum and peritoneum: Secondary | ICD-10-CM | POA: Insufficient documentation

## 2022-12-03 DIAGNOSIS — C538 Malignant neoplasm of overlapping sites of cervix uteri: Secondary | ICD-10-CM | POA: Diagnosis not present

## 2022-12-03 DIAGNOSIS — D649 Anemia, unspecified: Secondary | ICD-10-CM | POA: Diagnosis not present

## 2022-12-03 DIAGNOSIS — Z95828 Presence of other vascular implants and grafts: Secondary | ICD-10-CM | POA: Diagnosis not present

## 2022-12-03 DIAGNOSIS — C7802 Secondary malignant neoplasm of left lung: Secondary | ICD-10-CM | POA: Diagnosis not present

## 2022-12-03 DIAGNOSIS — Z5112 Encounter for antineoplastic immunotherapy: Secondary | ICD-10-CM | POA: Insufficient documentation

## 2022-12-03 DIAGNOSIS — I110 Hypertensive heart disease with heart failure: Secondary | ICD-10-CM | POA: Insufficient documentation

## 2022-12-03 DIAGNOSIS — Z5111 Encounter for antineoplastic chemotherapy: Secondary | ICD-10-CM | POA: Insufficient documentation

## 2022-12-03 DIAGNOSIS — Z79633 Long term (current) use of mitotic inhibitor: Secondary | ICD-10-CM | POA: Diagnosis not present

## 2022-12-03 DIAGNOSIS — C787 Secondary malignant neoplasm of liver and intrahepatic bile duct: Secondary | ICD-10-CM | POA: Insufficient documentation

## 2022-12-03 DIAGNOSIS — C7801 Secondary malignant neoplasm of right lung: Secondary | ICD-10-CM | POA: Insufficient documentation

## 2022-12-03 LAB — CBC WITH DIFFERENTIAL/PLATELET
Abs Immature Granulocytes: 0.03 10*3/uL (ref 0.00–0.07)
Basophils Absolute: 0 10*3/uL (ref 0.0–0.1)
Basophils Relative: 0 %
Eosinophils Absolute: 0.1 10*3/uL (ref 0.0–0.5)
Eosinophils Relative: 1 %
HCT: 33.3 % — ABNORMAL LOW (ref 36.0–46.0)
Hemoglobin: 10.7 g/dL — ABNORMAL LOW (ref 12.0–15.0)
Immature Granulocytes: 0 %
Lymphocytes Relative: 27 %
Lymphs Abs: 2.3 10*3/uL (ref 0.7–4.0)
MCH: 28.8 pg (ref 26.0–34.0)
MCHC: 32.1 g/dL (ref 30.0–36.0)
MCV: 89.5 fL (ref 80.0–100.0)
Monocytes Absolute: 1.1 10*3/uL — ABNORMAL HIGH (ref 0.1–1.0)
Monocytes Relative: 12 %
Neutro Abs: 5.2 10*3/uL (ref 1.7–7.7)
Neutrophils Relative %: 60 %
Platelets: 258 10*3/uL (ref 150–400)
RBC: 3.72 MIL/uL — ABNORMAL LOW (ref 3.87–5.11)
RDW: 25.1 % — ABNORMAL HIGH (ref 11.5–15.5)
WBC: 8.7 10*3/uL (ref 4.0–10.5)
nRBC: 0 % (ref 0.0–0.2)

## 2022-12-03 LAB — COMPREHENSIVE METABOLIC PANEL
ALT: 13 U/L (ref 0–44)
AST: 23 U/L (ref 15–41)
Albumin: 3.1 g/dL — ABNORMAL LOW (ref 3.5–5.0)
Alkaline Phosphatase: 77 U/L (ref 38–126)
Anion gap: 11 (ref 5–15)
BUN: 9 mg/dL (ref 6–20)
CO2: 23 mmol/L (ref 22–32)
Calcium: 8.6 mg/dL — ABNORMAL LOW (ref 8.9–10.3)
Chloride: 100 mmol/L (ref 98–111)
Creatinine, Ser: 1.18 mg/dL — ABNORMAL HIGH (ref 0.44–1.00)
GFR, Estimated: 57 mL/min — ABNORMAL LOW (ref >60)
Glucose, Bld: 97 mg/dL (ref 70–99)
Potassium: 3.7 mmol/L (ref 3.5–5.1)
Sodium: 134 mmol/L — ABNORMAL LOW (ref 135–145)
Total Bilirubin: 0.4 mg/dL (ref 0.3–1.2)
Total Protein: 6.6 g/dL (ref 6.5–8.1)

## 2022-12-03 LAB — MAGNESIUM: Magnesium: 1.9 mg/dL (ref 1.7–2.4)

## 2022-12-03 LAB — TSH: TSH: 2.49 u[IU]/mL (ref 0.350–4.500)

## 2022-12-03 MED ORDER — PREDNISOLONE 5 MG PO TABS: 5.0000 mg | ORAL_TABLET | Freq: Every day | ORAL | 0 refills | Status: DC

## 2022-12-03 MED ORDER — HYDROXYZINE HCL 10 MG PO TABS: 10.0000 mg | ORAL_TABLET | Freq: Three times a day (TID) | ORAL | 0 refills | Status: DC | PRN

## 2022-12-03 MED ORDER — HEPARIN SOD (PORK) LOCK FLUSH 100 UNIT/ML IV SOLN
500.0000 [IU] | Freq: Once | INTRAVENOUS | Status: AC
  Administered 2022-12-03: 500 [IU] via INTRAVENOUS

## 2022-12-03 MED ORDER — SODIUM CHLORIDE 0.9% FLUSH
10.0000 mL | Freq: Once | INTRAVENOUS | Status: AC
  Administered 2022-12-03: 10 mL via INTRAVENOUS

## 2022-12-03 NOTE — Patient Instructions (Signed)
[facility]  Discharge Instructions  You were seen and examined today by Dr. Delton Coombes.  Dr. Delton Coombes discussed your most recent lab work which revealed that everything looks good.  Get your treatment tomorrow.  Start using udder cream, Vaseline or some type of oily lotion. Dr. Delton Coombes sent in prednisone and atarax to help with the itching.  Follow-up as scheduled in 3 weeks.    Thank you for choosing [facility] to provide your oncology and hematology care.   To afford each patient quality time with our provider, please arrive at least 15 minutes before your scheduled appointment time. You may need to reschedule your appointment if you arrive late (10 or more minutes). Arriving late affects you and other patients whose appointments are after yours.  Also, if you miss three or more appointments without notifying the office, you may be dismissed from the clinic at the provider's discretion.    Again, thank you for choosing [facility].  Our hope is that these requests will decrease the amount of time that you wait before being seen by our physicians.   If you have a lab appointment with the [facility] - please note that after April 8th, all labs will be drawn in the cancer center.  You do not have to check in or register with the main entrance as you have in the past but will complete your check-in at the cancer center.            _____________________________________________________________  Should you have questions after your visit to [facility], please contact our office at [phone] and follow the prompts.  Our office hours are 8:00 a.m. to 4:30 p.m. Monday - Thursday and 8:00 a.m. to 2:30 p.m. Friday.  Please note that voicemails left after 4:00 p.m. may not be returned until the following business day.  We are closed weekends and all major holidays.  You do have access to a nurse 24-7, just  call the main number to the clinic [phone] and do not press any options, hold on the line and a nurse will answer the phone.    For prescription refill requests, have your pharmacy contact our office and allow 72 hours.    Masks are no longer required in the cancer centers. If you would like for your care team to wear a mask while they are taking care of you, please let them know. You may have one support person who is at least 50 years old accompany you for your appointments.

## 2022-12-03 NOTE — Progress Notes (Signed)
Patients port flushed without difficulty.  Good blood return noted with no bruising or swelling noted at site.  Band aid applied.  VSS with discharge and left in satisfactory condition with no s/s of distress noted.   

## 2022-12-04 ENCOUNTER — Inpatient Hospital Stay: Payer: Medicaid Other

## 2022-12-04 VITALS — BP 140/87 | HR 90 | Temp 98.2°F | Resp 20

## 2022-12-04 DIAGNOSIS — I110 Hypertensive heart disease with heart failure: Secondary | ICD-10-CM | POA: Diagnosis not present

## 2022-12-04 DIAGNOSIS — C7801 Secondary malignant neoplasm of right lung: Secondary | ICD-10-CM | POA: Diagnosis not present

## 2022-12-04 DIAGNOSIS — R21 Rash and other nonspecific skin eruption: Secondary | ICD-10-CM | POA: Diagnosis not present

## 2022-12-04 DIAGNOSIS — C787 Secondary malignant neoplasm of liver and intrahepatic bile duct: Secondary | ICD-10-CM | POA: Diagnosis not present

## 2022-12-04 DIAGNOSIS — I509 Heart failure, unspecified: Secondary | ICD-10-CM | POA: Diagnosis not present

## 2022-12-04 DIAGNOSIS — C539 Malignant neoplasm of cervix uteri, unspecified: Secondary | ICD-10-CM | POA: Diagnosis not present

## 2022-12-04 DIAGNOSIS — D649 Anemia, unspecified: Secondary | ICD-10-CM | POA: Diagnosis not present

## 2022-12-04 DIAGNOSIS — Z79633 Long term (current) use of mitotic inhibitor: Secondary | ICD-10-CM | POA: Diagnosis not present

## 2022-12-04 DIAGNOSIS — Z5112 Encounter for antineoplastic immunotherapy: Secondary | ICD-10-CM | POA: Diagnosis not present

## 2022-12-04 DIAGNOSIS — F17210 Nicotine dependence, cigarettes, uncomplicated: Secondary | ICD-10-CM | POA: Diagnosis not present

## 2022-12-04 DIAGNOSIS — Z95828 Presence of other vascular implants and grafts: Secondary | ICD-10-CM

## 2022-12-04 DIAGNOSIS — C538 Malignant neoplasm of overlapping sites of cervix uteri: Secondary | ICD-10-CM

## 2022-12-04 DIAGNOSIS — C7802 Secondary malignant neoplasm of left lung: Secondary | ICD-10-CM | POA: Diagnosis not present

## 2022-12-04 DIAGNOSIS — C786 Secondary malignant neoplasm of retroperitoneum and peritoneum: Secondary | ICD-10-CM | POA: Diagnosis not present

## 2022-12-04 DIAGNOSIS — Z5111 Encounter for antineoplastic chemotherapy: Secondary | ICD-10-CM | POA: Diagnosis not present

## 2022-12-04 LAB — URINALYSIS, DIPSTICK ONLY
Bilirubin Urine: NEGATIVE
Glucose, UA: NEGATIVE mg/dL
Ketones, ur: NEGATIVE mg/dL
Leukocytes,Ua: NEGATIVE
Nitrite: NEGATIVE
Protein, ur: NEGATIVE mg/dL
Specific Gravity, Urine: 1.008 (ref 1.005–1.030)
pH: 7 (ref 5.0–8.0)

## 2022-12-04 MED ORDER — SODIUM CHLORIDE 0.9 % IV SOLN: Freq: Once | INTRAVENOUS | Status: AC

## 2022-12-04 MED ORDER — SODIUM CHLORIDE 0.9 % IV SOLN
15.0000 mg/kg | Freq: Once | INTRAVENOUS | Status: AC
  Administered 2022-12-04: 1300 mg via INTRAVENOUS
  Filled 2022-12-04: qty 16

## 2022-12-04 MED ORDER — HEPARIN SOD (PORK) LOCK FLUSH 100 UNIT/ML IV SOLN
500.0000 [IU] | Freq: Once | INTRAVENOUS | Status: AC | PRN
  Administered 2022-12-04: 500 [IU]

## 2022-12-04 MED ORDER — FAMOTIDINE IN NACL 20-0.9 MG/50ML-% IV SOLN
20.0000 mg | Freq: Once | INTRAVENOUS | Status: AC
  Administered 2022-12-04: 20 mg via INTRAVENOUS
  Filled 2022-12-04: qty 50

## 2022-12-04 MED ORDER — SODIUM CHLORIDE 0.9% FLUSH
10.0000 mL | INTRAVENOUS | Status: DC | PRN
  Administered 2022-12-04: 10 mL

## 2022-12-04 MED ORDER — POTASSIUM CHLORIDE IN NACL 20-0.9 MEQ/L-% IV SOLN
Freq: Once | INTRAVENOUS | Status: AC
  Filled 2022-12-04: qty 1000

## 2022-12-04 MED ORDER — SODIUM CHLORIDE 0.9 % IV SOLN
50.0000 mg/m2 | Freq: Once | INTRAVENOUS | Status: AC
  Administered 2022-12-04: 100 mg via INTRAVENOUS
  Filled 2022-12-04: qty 100

## 2022-12-04 MED ORDER — SODIUM CHLORIDE 0.9 % IV SOLN
200.0000 mg | Freq: Once | INTRAVENOUS | Status: AC
  Administered 2022-12-04: 200 mg via INTRAVENOUS
  Filled 2022-12-04: qty 8

## 2022-12-04 MED ORDER — CETIRIZINE HCL 10 MG/ML IV SOLN
10.0000 mg | Freq: Once | INTRAVENOUS | Status: AC
  Administered 2022-12-04: 10 mg via INTRAVENOUS
  Filled 2022-12-04: qty 1

## 2022-12-04 MED ORDER — PALONOSETRON HCL INJECTION 0.25 MG/5ML
0.2500 mg | Freq: Once | INTRAVENOUS | Status: AC
  Administered 2022-12-04: 0.25 mg via INTRAVENOUS
  Filled 2022-12-04: qty 5

## 2022-12-04 MED ORDER — SODIUM CHLORIDE 0.9 % IV SOLN
135.0000 mg/m2 | Freq: Once | INTRAVENOUS | Status: AC
  Administered 2022-12-04: 276 mg via INTRAVENOUS
  Filled 2022-12-04: qty 46

## 2022-12-04 MED ORDER — SODIUM CHLORIDE 0.9 % IV SOLN
10.0000 mg | Freq: Once | INTRAVENOUS | Status: AC
  Administered 2022-12-04: 10 mg via INTRAVENOUS
  Filled 2022-12-04: qty 10

## 2022-12-04 MED ORDER — MAGNESIUM SULFATE 2 GM/50ML IV SOLN
2.0000 g | Freq: Once | INTRAVENOUS | Status: AC
  Administered 2022-12-04: 2 g via INTRAVENOUS
  Filled 2022-12-04: qty 50

## 2022-12-04 MED ORDER — SODIUM CHLORIDE 0.9 % IV SOLN
150.0000 mg | Freq: Once | INTRAVENOUS | Status: AC
  Administered 2022-12-04: 150 mg via INTRAVENOUS
  Filled 2022-12-04: qty 150

## 2022-12-04 NOTE — Patient Instructions (Signed)
[facility]  Discharge Instructions: Thank you for choosing [facility] to provide your oncology and hematology care.  If you have a lab appointment with the [facility] - please note that after April 8th, 2024, all labs will be drawn in the cancer center.  You do not have to check in or register with the main entrance as you have in the past but will complete your check-in in the cancer center.  Wear comfortable clothing and clothing appropriate for easy access to any Portacath or PICC line.   We strive to give you quality time with your provider. You may need to reschedule your appointment if you arrive late (15 or more minutes).  Arriving late affects you and other patients whose appointments are after yours.  Also, if you miss three or more appointments without notifying the office, you may be dismissed from the clinic at the provider's discretion.      For prescription refill requests, have your pharmacy contact our office and allow 72 hours for refills to be completed.    Today you received the following chemotherapy and/or immunotherapy agents Keytruda, Vegzelma, Taxol, and Cisplatin.   Cisplatin Injection What is this medication? CISPLATIN (SIS pla tin) treats some types of cancer. It works by slowing down the growth of cancer cells. This medicine may be used for other purposes; ask your health care provider or pharmacist if you have questions. COMMON BRAND NAME(S): Platinol, Platinol -AQ What should I tell my care team before I take this medication? They need to know if you have any of these conditions: Eye disease, vision problems Hearing problems Kidney disease Low blood counts, such as low white cells, platelets, or red blood cells Tingling of the fingers or toes, or other nerve disorder An unusual or allergic reaction to cisplatin, carboplatin, oxaliplatin, other medications, foods, dyes, or preservatives If you or your partner are pregnant  or trying to get pregnant Breast-feeding How should I use this medication? This medication is injected into a vein. It is given by your care team in a hospital or clinic setting. Talk to your care team about the use of this medication in children. Special care may be needed. Overdosage: If you think you have taken too much of this medicine contact a poison control center or emergency room at once. NOTE: This medicine is only for you. Do not share this medicine with others. What if I miss a dose? Keep appointments for follow-up doses. It is important not to miss your dose. Call your care team if you are unable to keep an appointment. What may interact with this medication? Do not take this medication with any of the following: Live virus vaccines This medication may also interact with the following: Certain antibiotics, such as amikacin, gentamicin, neomycin, polymyxin B, streptomycin, tobramycin, vancomycin Foscarnet This list may not describe all possible interactions. Give your health care provider a list of all the medicines, herbs, non-prescription drugs, or dietary supplements you use. Also tell them if you smoke, drink alcohol, or use illegal drugs. Some items may interact with your medicine. What should I watch for while using this medication? Your condition will be monitored carefully while you are receiving this medication. You may need blood work done while taking this medication. This medication may make you feel generally unwell. This is not uncommon, as chemotherapy can affect healthy cells as well as cancer cells. Report any side effects. Continue your course of treatment even though you feel  ill unless your care team tells you to stop. This medication may increase your risk of getting an infection. Call your care team for advice if you get a fever, chills, sore throat, or other symptoms of a cold or flu. Do not treat yourself. Try to avoid being around people who are sick. Avoid  taking medications that contain aspirin, acetaminophen, ibuprofen, naproxen, or ketoprofen unless instructed by your care team. These medications may hide a fever. This medication may increase your risk to bruise or bleed. Call your care team if you notice any unusual bleeding. Be careful brushing or flossing your teeth or using a toothpick because you may get an infection or bleed more easily. If you have any dental work done, tell your dentist you are receiving this medication. Drink fluids as directed while you are taking this medication. This will help protect your kidneys. Call your care team if you get diarrhea. Do not treat yourself. Talk to your care team if you or your partner wish to become pregnant or think you might be pregnant. This medication can cause serious birth defects if taken during pregnancy and for 14 months after the last dose. A negative pregnancy test is required before starting this medication. A reliable form of contraception is recommended while taking this medication and for 14 months after the last dose. Talk to your care team about effective forms of contraception. Do not father a child while taking this medication and for 11 months after the last dose. Use a condom during sex during this time period. Do not breast-feed while taking this medication. This medication may cause infertility. Talk to your care team if you are concerned about your fertility. What side effects may I notice from receiving this medication? Side effects that you should report to your care team as soon as possible: Allergic reactions--skin rash, itching, hives, swelling of the face, lips, tongue, or throat Eye pain, change in vision, vision loss Hearing loss, ringing in ears Infection--fever, chills, cough, sore throat, wounds that don't heal, pain or trouble when passing urine, general feeling of discomfort or being unwell Kidney injury--decrease in the amount of urine, swelling of the ankles,  hands, or feet Low red blood cell level--unusual weakness or fatigue, dizziness, headache, trouble breathing Painful swelling, warmth, or redness of the skin, blisters or sores at the infusion site Pain, tingling, or numbness in the hands or feet Unusual bruising or bleeding Side effects that usually do not require medical attention (report to your care team if they continue or are bothersome): Hair loss Nausea Vomiting This list may not describe all possible side effects. Call your doctor for medical advice about side effects. You may report side effects to FDA at 1-800-FDA-1088. Where should I keep my medication? This medication is given in a hospital or clinic. It will not be stored at home. NOTE: This sheet is a summary. It may not cover all possible information. If you have questions about this medicine, talk to your doctor, pharmacist, or health care provider.  2023 Elsevier/Gold Standard (2021-12-13 00:00:00) Paclitaxel Injection What is this medication? PACLITAXEL (PAK li TAX el) treats some types of cancer. It works by slowing down the growth of cancer cells. This medicine may be used for other purposes; ask your health care provider or pharmacist if you have questions. COMMON BRAND NAME(S): Onxol, Taxol What should I tell my care team before I take this medication? They need to know if you have any of these conditions: Heart disease Liver  disease Low white blood cell levels An unusual or allergic reaction to paclitaxel, other medications, foods, dyes, or preservatives If you or your partner are pregnant or trying to get pregnant Breast-feeding How should I use this medication? This medication is injected into a vein. It is given by your care team in a hospital or clinic setting. Talk to your care team about the use of this medication in children. While it may be given to children for selected conditions, precautions do apply. Overdosage: If you think you have taken too much  of this medicine contact a poison control center or emergency room at once. NOTE: This medicine is only for you. Do not share this medicine with others. What if I miss a dose? Keep appointments for follow-up doses. It is important not to miss your dose. Call your care team if you are unable to keep an appointment. What may interact with this medication? Do not take this medication with any of the following: Live virus vaccines Other medications may affect the way this medication works. Talk with your care team about all of the medications you take. They may suggest changes to your treatment plan to lower the risk of side effects and to make sure your medications work as intended. This list may not describe all possible interactions. Give your health care provider a list of all the medicines, herbs, non-prescription drugs, or dietary supplements you use. Also tell them if you smoke, drink alcohol, or use illegal drugs. Some items may interact with your medicine. What should I watch for while using this medication? Your condition will be monitored carefully while you are receiving this medication. You may need blood work while taking this medication. This medication may make you feel generally unwell. This is not uncommon as chemotherapy can affect healthy cells as well as cancer cells. Report any side effects. Continue your course of treatment even though you feel ill unless your care team tells you to stop. This medication can cause serious allergic reactions. To reduce the risk, your care team may give you other medications to take before receiving this one. Be sure to follow the directions from your care team. This medication may increase your risk of getting an infection. Call your care team for advice if you get a fever, chills, sore throat, or other symptoms of a cold or flu. Do not treat yourself. Try to avoid being around people who are sick. This medication may increase your risk to bruise or  bleed. Call your care team if you notice any unusual bleeding. Be careful brushing or flossing your teeth or using a toothpick because you may get an infection or bleed more easily. If you have any dental work done, tell your dentist you are receiving this medication. Talk to your care team if you may be pregnant. Serious birth defects can occur if you take this medication during pregnancy. Talk to your care team before breastfeeding. Changes to your treatment plan may be needed. What side effects may I notice from receiving this medication? Side effects that you should report to your care team as soon as possible: Allergic reactions--skin rash, itching, hives, swelling of the face, lips, tongue, or throat Heart rhythm changes--fast or irregular heartbeat, dizziness, feeling faint or lightheaded, chest pain, trouble breathing Increase in blood pressure Infection--fever, chills, cough, sore throat, wounds that don't heal, pain or trouble when passing urine, general feeling of discomfort or being unwell Low blood pressure--dizziness, feeling faint or lightheaded, blurry vision Low red  blood cell level--unusual weakness or fatigue, dizziness, headache, trouble breathing Painful swelling, warmth, or redness of the skin, blisters or sores at the infusion site Pain, tingling, or numbness in the hands or feet Slow heartbeat--dizziness, feeling faint or lightheaded, confusion, trouble breathing, unusual weakness or fatigue Unusual bruising or bleeding Side effects that usually do not require medical attention (report to your care team if they continue or are bothersome): Diarrhea Hair loss Joint pain Loss of appetite Muscle pain Nausea Vomiting This list may not describe all possible side effects. Call your doctor for medical advice about side effects. You may report side effects to FDA at 1-800-FDA-1088. Where should I keep my medication? This medication is given in a hospital or clinic. It will  not be stored at home. NOTE: This sheet is a summary. It may not cover all possible information. If you have questions about this medicine, talk to your doctor, pharmacist, or health care provider.  2023 Elsevier/Gold Standard (2022-01-02 00:00:00) Pembrolizumab Injection What is this medication? PEMBROLIZUMAB (PEM broe LIZ ue mab) treats some types of cancer. It works by helping your immune system slow or stop the spread of cancer cells. It is a monoclonal antibody. This medicine may be used for other purposes; ask your health care provider or pharmacist if you have questions. COMMON BRAND NAME(S): Keytruda What should I tell my care team before I take this medication? They need to know if you have any of these conditions: Allogeneic stem cell transplant (uses someone else's stem cells) Autoimmune diseases, such as Crohn disease, ulcerative colitis, lupus History of chest radiation Nervous system problems, such as Guillain-Barre syndrome, myasthenia gravis Organ transplant An unusual or allergic reaction to pembrolizumab, other medications, foods, dyes, or preservatives Pregnant or trying to get pregnant Breast-feeding How should I use this medication? This medication is injected into a vein. It is given by your care team in a hospital or clinic setting. A special MedGuide will be given to you before each treatment. Be sure to read this information carefully each time. Talk to your care team about the use of this medication in children. While it may be prescribed for children as young as 6 months for selected conditions, precautions do apply. Overdosage: If you think you have taken too much of this medicine contact a poison control center or emergency room at once. NOTE: This medicine is only for you. Do not share this medicine with others. What if I miss a dose? Keep appointments for follow-up doses. It is important not to miss your dose. Call your care team if you are unable to keep an  appointment. What may interact with this medication? Interactions have not been studied. This list may not describe all possible interactions. Give your health care provider a list of all the medicines, herbs, non-prescription drugs, or dietary supplements you use. Also tell them if you smoke, drink alcohol, or use illegal drugs. Some items may interact with your medicine. What should I watch for while using this medication? Your condition will be monitored carefully while you are receiving this medication. You may need blood work while taking this medication. This medication may cause serious skin reactions. They can happen weeks to months after starting the medication. Contact your care team right away if you notice fevers or flu-like symptoms with a rash. The rash may be red or purple and then turn into blisters or peeling of the skin. You may also notice a red rash with swelling of the face, lips, or  lymph nodes in your neck or under your arms. Tell your care team right away if you have any change in your eyesight. Talk to your care team if you may be pregnant. Serious birth defects can occur if you take this medication during pregnancy and for 4 months after the last dose. You will need a negative pregnancy test before starting this medication. Contraception is recommended while taking this medication and for 4 months after the last dose. Your care team can help you find the option that works for you. Do not breastfeed while taking this medication and for 4 months after the last dose. What side effects may I notice from receiving this medication? Side effects that you should report to your care team as soon as possible: Allergic reactions--skin rash, itching, hives, swelling of the face, lips, tongue, or throat Dry cough, shortness of breath or trouble breathing Eye pain, redness, irritation, or discharge with blurry or decreased vision Heart muscle inflammation--unusual weakness or fatigue,  shortness of breath, chest pain, fast or irregular heartbeat, dizziness, swelling of the ankles, feet, or hands Hormone gland problems--headache, sensitivity to light, unusual weakness or fatigue, dizziness, fast or irregular heartbeat, increased sensitivity to cold or heat, excessive sweating, constipation, hair loss, increased thirst or amount of urine, tremors or shaking, irritability Infusion reactions--chest pain, shortness of breath or trouble breathing, feeling faint or lightheaded Kidney injury (glomerulonephritis)--decrease in the amount of urine, red or dark brown urine, foamy or bubbly urine, swelling of the ankles, hands, or feet Liver injury--right upper belly pain, loss of appetite, nausea, light-colored stool, dark yellow or brown urine, yellowing skin or eyes, unusual weakness or fatigue Pain, tingling, or numbness in the hands or feet, muscle weakness, change in vision, confusion or trouble speaking, loss of balance or coordination, trouble walking, seizures Rash, fever, and swollen lymph nodes Redness, blistering, peeling, or loosening of the skin, including inside the mouth Sudden or severe stomach pain, bloody diarrhea, fever, nausea, vomiting Side effects that usually do not require medical attention (report to your care team if they continue or are bothersome): Bone, joint, or muscle pain Diarrhea Fatigue Loss of appetite Nausea Skin rash This list may not describe all possible side effects. Call your doctor for medical advice about side effects. You may report side effects to FDA at 1-800-FDA-1088. Where should I keep my medication? This medication is given in a hospital or clinic. It will not be stored at home. NOTE: This sheet is a summary. It may not cover all possible information. If you have questions about this medicine, talk to your doctor, pharmacist, or health care provider.  2023 Elsevier/Gold Standard (2021-12-31 00:00:00)        To help prevent nausea and  vomiting after your treatment, we encourage you to take your nausea medication as directed.  BELOW ARE SYMPTOMS THAT SHOULD BE REPORTED IMMEDIATELY: *FEVER GREATER THAN 100.4 F (38 C) OR HIGHER *CHILLS OR SWEATING *NAUSEA AND VOMITING THAT IS NOT CONTROLLED WITH YOUR NAUSEA MEDICATION *UNUSUAL SHORTNESS OF BREATH *UNUSUAL BRUISING OR BLEEDING *URINARY PROBLEMS (pain or burning when urinating, or frequent urination) *BOWEL PROBLEMS (unusual diarrhea, constipation, pain near the anus) TENDERNESS IN MOUTH AND THROAT WITH OR WITHOUT PRESENCE OF ULCERS (sore throat, sores in mouth, or a toothache) UNUSUAL RASH, SWELLING OR PAIN  UNUSUAL VAGINAL DISCHARGE OR ITCHING   Items with * indicate a potential emergency and should be followed up as soon as possible or go to the Emergency Department if any problems should occur.  Please show the CHEMOTHERAPY ALERT CARD or IMMUNOTHERAPY ALERT CARD at check-in to the Emergency Department and triage nurse.  Should you have questions after your visit or need to cancel or reschedule your appointment, please contact [facility] [phone]  and follow the prompts.  Office hours are 8:00 a.m. to 4:30 p.m. Monday - Friday. Please note that voicemails left after 4:00 p.m. may not be returned until the following business day.  We are closed weekends and major holidays. You have access to a nurse at all times for urgent questions. Please call the main number to the clinic [phone] and follow the prompts.  For any non-urgent questions, you may also contact your provider using MyChart. We now offer e-Visits for anyone 35 and older to request care online for non-urgent symptoms. For details visit [email or web address].   Also download the MyChart app! Go to the app store, search "MyChart", open the app, select [facility], and log in with your MyChart username and password.

## 2022-12-04 NOTE — Progress Notes (Signed)
Patient presents today for chemotherapy infusion.  Patient is in satisfactory condition with no new complaints voiced.  HR originally 102 upon arrival and blood pressure 161/78, rechecked now HR 82 and blood pressure 135/76-all other vitals stable. Labs reviewed with exception of urine protein (patient will  try to give urine sample prior to [facility]) all other labs are within treatment parameters at this time.  We will proceed with treatment per MD orders.    Patient able to void at 1330, 7110mL noted, specimen sent to lab.   Urine protein negative will continue with Vegzelma treatment.   Patient tolerated treatment well with no complaints voiced.  Patient left ambulatory in stable condition.  Vital signs stable at discharge.  Follow up as scheduled.

## 2022-12-05 ENCOUNTER — Other Ambulatory Visit: Payer: Self-pay | Admitting: *Deleted

## 2022-12-05 MED ORDER — PREDNISONE 5 MG PO TABS: 5.0000 mg | ORAL_TABLET | Freq: Every day | ORAL | 2 refills | Status: DC

## 2022-12-08 ENCOUNTER — Telehealth: Payer: Self-pay | Admitting: Internal Medicine

## 2022-12-08 ENCOUNTER — Other Ambulatory Visit: Payer: Self-pay

## 2022-12-08 ENCOUNTER — Other Ambulatory Visit: Payer: Self-pay | Admitting: *Deleted

## 2022-12-08 DIAGNOSIS — G893 Neoplasm related pain (acute) (chronic): Secondary | ICD-10-CM

## 2022-12-08 MED ORDER — HYDROCODONE-ACETAMINOPHEN 5-325 MG PO TABS
1.0000 | ORAL_TABLET | Freq: Three times a day (TID) | ORAL | 0 refills | Status: DC | PRN
Start: 2022-12-08 — End: 2023-03-12

## 2022-12-08 MED ORDER — AMLODIPINE BESYLATE 10 MG PO TABS: 10.0000 mg | ORAL_TABLET | Freq: Every day | ORAL | 2 refills | Status: DC

## 2022-12-08 NOTE — Telephone Encounter (Signed)
Prescription Request  12/08/2022  LOV: 11/18/2022  What is the name of the medication or equipment? amLODipine (NORVASC) 10 MG tablet   Have you contacted your pharmacy to request a refill? No   Which pharmacy would you like this sent to?  [facility] [address] [place] [address] Phone: [phone] Fax: [phone]    Patient notified that their request is being sent to the clinical staff for review and that they should receive a response within 2 business days.   Please advise at [facility] [phone]   PT IS COMPLETELY OUT OF MED

## 2022-12-08 NOTE — Telephone Encounter (Signed)
Refills sent

## 2022-12-18 ENCOUNTER — Ambulatory Visit: Payer: Medicaid Other | Admitting: Internal Medicine

## 2022-12-19 ENCOUNTER — Ambulatory Visit (HOSPITAL_COMMUNITY): Payer: Medicaid Other

## 2022-12-24 ENCOUNTER — Inpatient Hospital Stay: Payer: Medicaid Other

## 2022-12-24 ENCOUNTER — Inpatient Hospital Stay (HOSPITAL_BASED_OUTPATIENT_CLINIC_OR_DEPARTMENT_OTHER): Payer: Medicaid Other | Admitting: Hematology

## 2022-12-24 DIAGNOSIS — D649 Anemia, unspecified: Secondary | ICD-10-CM | POA: Diagnosis not present

## 2022-12-24 DIAGNOSIS — C7802 Secondary malignant neoplasm of left lung: Secondary | ICD-10-CM | POA: Diagnosis not present

## 2022-12-24 DIAGNOSIS — Z5112 Encounter for antineoplastic immunotherapy: Secondary | ICD-10-CM | POA: Diagnosis not present

## 2022-12-24 DIAGNOSIS — I509 Heart failure, unspecified: Secondary | ICD-10-CM | POA: Diagnosis not present

## 2022-12-24 DIAGNOSIS — Z79633 Long term (current) use of mitotic inhibitor: Secondary | ICD-10-CM | POA: Diagnosis not present

## 2022-12-24 DIAGNOSIS — C7801 Secondary malignant neoplasm of right lung: Secondary | ICD-10-CM | POA: Diagnosis not present

## 2022-12-24 DIAGNOSIS — C538 Malignant neoplasm of overlapping sites of cervix uteri: Secondary | ICD-10-CM

## 2022-12-24 DIAGNOSIS — I110 Hypertensive heart disease with heart failure: Secondary | ICD-10-CM | POA: Diagnosis not present

## 2022-12-24 DIAGNOSIS — F17210 Nicotine dependence, cigarettes, uncomplicated: Secondary | ICD-10-CM | POA: Diagnosis not present

## 2022-12-24 DIAGNOSIS — Z95828 Presence of other vascular implants and grafts: Secondary | ICD-10-CM | POA: Diagnosis not present

## 2022-12-24 DIAGNOSIS — C539 Malignant neoplasm of cervix uteri, unspecified: Secondary | ICD-10-CM | POA: Diagnosis not present

## 2022-12-24 DIAGNOSIS — Z5111 Encounter for antineoplastic chemotherapy: Secondary | ICD-10-CM | POA: Diagnosis not present

## 2022-12-24 DIAGNOSIS — C787 Secondary malignant neoplasm of liver and intrahepatic bile duct: Secondary | ICD-10-CM | POA: Diagnosis not present

## 2022-12-24 DIAGNOSIS — C786 Secondary malignant neoplasm of retroperitoneum and peritoneum: Secondary | ICD-10-CM | POA: Diagnosis not present

## 2022-12-24 DIAGNOSIS — R21 Rash and other nonspecific skin eruption: Secondary | ICD-10-CM | POA: Diagnosis not present

## 2022-12-24 LAB — CBC WITH DIFFERENTIAL/PLATELET
Abs Immature Granulocytes: 0.03 10*3/uL (ref 0.00–0.07)
Basophils Absolute: 0 10*3/uL (ref 0.0–0.1)
Basophils Relative: 0 %
Eosinophils Absolute: 0 10*3/uL (ref 0.0–0.5)
Eosinophils Relative: 1 %
HCT: 33.6 % — ABNORMAL LOW (ref 36.0–46.0)
Hemoglobin: 10.9 g/dL — ABNORMAL LOW (ref 12.0–15.0)
Immature Granulocytes: 1 %
Lymphocytes Relative: 31 %
Lymphs Abs: 1.8 10*3/uL (ref 0.7–4.0)
MCH: 30 pg (ref 26.0–34.0)
MCHC: 32.4 g/dL (ref 30.0–36.0)
MCV: 92.6 fL (ref 80.0–100.0)
Monocytes Absolute: 0.6 10*3/uL (ref 0.1–1.0)
Monocytes Relative: 11 %
Neutro Abs: 3.3 10*3/uL (ref 1.7–7.7)
Neutrophils Relative %: 56 %
Platelets: 290 10*3/uL (ref 150–400)
RBC: 3.63 MIL/uL — ABNORMAL LOW (ref 3.87–5.11)
RDW: 24.2 % — ABNORMAL HIGH (ref 11.5–15.5)
WBC: 5.8 10*3/uL (ref 4.0–10.5)
nRBC: 0 % (ref 0.0–0.2)

## 2022-12-24 LAB — COMPREHENSIVE METABOLIC PANEL
ALT: 13 U/L (ref 0–44)
AST: 20 U/L (ref 15–41)
Albumin: 3.3 g/dL — ABNORMAL LOW (ref 3.5–5.0)
Alkaline Phosphatase: 72 U/L (ref 38–126)
Anion gap: 9 (ref 5–15)
BUN: 9 mg/dL (ref 6–20)
CO2: 22 mmol/L (ref 22–32)
Calcium: 8.8 mg/dL — ABNORMAL LOW (ref 8.9–10.3)
Chloride: 104 mmol/L (ref 98–111)
Creatinine, Ser: 0.97 mg/dL (ref 0.44–1.00)
GFR, Estimated: >60 mL/min (ref >60)
Glucose, Bld: 105 mg/dL — ABNORMAL HIGH (ref 70–99)
Potassium: 3.9 mmol/L (ref 3.5–5.1)
Sodium: 135 mmol/L (ref 135–145)
Total Bilirubin: 0.4 mg/dL (ref 0.3–1.2)
Total Protein: 6.9 g/dL (ref 6.5–8.1)

## 2022-12-24 LAB — TSH: TSH: 2.311 u[IU]/mL (ref 0.350–4.500)

## 2022-12-24 LAB — MAGNESIUM: Magnesium: 1.8 mg/dL (ref 1.7–2.4)

## 2022-12-24 MED ORDER — SODIUM CHLORIDE 0.9% FLUSH
10.0000 mL | Freq: Once | INTRAVENOUS | Status: AC
  Administered 2022-12-24: 10 mL via INTRAVENOUS

## 2022-12-24 MED ORDER — DULOXETINE HCL 60 MG PO CPEP: 60.0000 mg | ORAL_CAPSULE | Freq: Every day | ORAL | 3 refills | Status: DC

## 2022-12-24 MED ORDER — HEPARIN SOD (PORK) LOCK FLUSH 100 UNIT/ML IV SOLN
500.0000 [IU] | Freq: Once | INTRAVENOUS | Status: AC
  Administered 2022-12-24: 500 [IU] via INTRAVENOUS

## 2022-12-24 NOTE — Progress Notes (Signed)
[facility] [address] [name]    Clinic Day:  12/24/2022  Referring physician: Doreatha Massed, MD  Patient Care Team: Billie Lade, MD as PCP - General (Internal Medicine) Doreatha Massed, MD as Medical Oncologist (Medical Oncology) Therese Sarah, RN as Oncology Nurse Navigator (Medical Oncology)   ASSESSMENT & PLAN:   Assessment: 1.  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 - CTAP (08/30/2022): Irregularity of the endometrium extending through the cervix with foci of gas in the cervix.  Multifocal omental and peritoneal metastasis.  Metastatic left periaortic, aortocaval, left external iliac and right common iliac lymph nodes.  Multiple bilateral lung nodules at bases.  12 mm hypodensity in the liver near the falciform ligament. - Endometrial biopsy (09/09/2022): Invasive moderately to poorly differentiated squamous cell carcinoma with abundant necrosis.  Cervix biopsy: Invasive and in situ moderate to poorly differentiated squamous cell carcinoma.  High risk HPV positive. - NGS: PD-L1 (22 C3): CPS 2, HER2 IHC negative.  PIK3CA exon 10 pathogenic variant.  MS-stable.  TMB-low.  FANCM and SDHA pathogenic variants. - PD-L1 CPS: 8% - Cycle 1 of cisplatin, paclitaxel, bevacizumab on 10/02/2022, pembrolizumab added with cycle 2 on 10/23/2022 - Bone scan (10/14/2022): No metastatic disease   2.  Social/family history: - She is married and lives at home with her husband and roommate.  She has never worked.  She is current active smoker, half pack per day, for the last 28 years.  She occasionally smokes marijuana. - Mother died of lung cancer.  Father had head and neck cancer.  Maternal grandmother had cancer.  Paternal uncle died of cancer.  Maternal uncle also had cancer.     Plan: 1.  Metastatic cervical cancer to the lungs, peritoneum and liver: - She received cycle 4 of chemoimmunotherapy with Keytruda on 12/04/2022. - She has experienced more fatigue and generalized body pains after last treatment. - She missed her CT scan which is rescheduled to 12/29/2022. - Reviewed labs today: Normal LFTs and creatinine.  CBC was grossly normal. - She will proceed with her cycle 5 tomorrow.  She will have CT scan done on 12/29/2022 and see me in 3 weeks. - If there is very good response, we will consider decreasing doses of medications due to neuropathy.   2.  Normocytic anemia: - Last Feraheme on 10/13/2022 and 10/20/2022.  Hemoglobin today is 10.9.   3.  Generalized skin rash with itching: - She developed generalized rash and itching after cycle 3 of chemotherapy. - She has taken prednisone 5 mg for 7 days and stopped it.  She did not see any new rash.  Recommend continue topical steroids.   4.  Hypertension: - Continue amlodipine 10 mg daily.  Blood pressure is 130/84.  5.  Weight loss: - She lost about 10 pounds in the last 6 weeks. - She reports decrease in taste and appetite. - Recommend eating at regular intervals rather than when she gets hungry.  Also recommend nutritional supplements.  Will make a referral for nutrition consult.  6.  Peripheral neuropathy: - She has developed neuropathic pains in the feet, more pronounced on walking. -I have recommended Cymbalta 60 mg daily.  She is already on Lexapro for depression.  I have recommended that she stop Lexapro and start on Cymbalta.  Orders  Placed This Encounter  Procedures   Ambulatory Referral to [facility] Nutrition    Referral Priority:   Routine    Referral Type:   Consultation    Referral Reason:   Specialty Services Required    Requested Specialty:   Oncology    Number of Visits Requested:   1      I,Katie Daubenspeck,acting as a scribe for Doreatha Massed, MD.,have documented all relevant  documentation on the behalf of Doreatha Massed, MD,as directed by  Doreatha Massed, MD while in the presence of Doreatha Massed, MD.   I, Doreatha Massed MD, have reviewed the above documentation for accuracy and completeness, and I agree with the above.   Doreatha Massed, MD   4/24/20243:51 PM  CHIEF COMPLAINT:   Diagnosis: metastatic cervical squamous cell carcinoma    Cancer Staging  Cervical cancer Staging form: Cervix Uteri, AJCC Version 9 - Clinical stage from 09/19/2022: FIGO Stage IVB (cT4, cN2a, cM1) - Unsigned    Prior Therapy: none  Current Therapy:  Cisplatin + Paclitaxel + Bevacizumab    HISTORY OF PRESENT ILLNESS:   Oncology History Overview Note  PD-L1 CPS 8%   Cervical cancer  09/19/2022 Initial Diagnosis   Cervical cancer (HCC)   10/02/2022 -  Chemotherapy   [name]        INTERVAL HISTORY:   Kristin Carson is a 52 y.o. female presenting to clinic today for follow up of metastatic cervical squamous cell carcinoma. She was last seen by me on 12/03/22.  She is scheduled for restaging CT C/A/P on 12/29/22.  Today, she states that she is doing well overall. Her appetite level is at 50%. Her energy level is at 50%.  PAST MEDICAL HISTORY:   Past Medical History: Past Medical History:  Diagnosis Date   Anemia    Asthma    Bronchitis    Cancer    Cervical   CHF (congestive heart failure)    COPD (chronic obstructive pulmonary disease)    Depression    GERD (gastroesophageal reflux disease)    Headache    Hypertension    Port-A-Cath in place 09/25/2022    Surgical History: Past Surgical History:  Procedure Laterality Date   BREAST SURGERY     CESAREAN SECTION     IR IMAGING GUIDED PORT INSERTION  09/30/2022   LEG SURGERY      Social History: Social History   Socioeconomic History   Marital status: Married    Spouse  name: Not on file   Number of children: Not on file   Years of education: Not on file   Highest education level: Not on file  Occupational History   Not on file  Tobacco Use   Smoking status: Every Day    Packs/day: 0.25    Years: 23.00    Additional pack years: 0.00    Total pack years: 5.75    Types: Cigarettes   Smokeless tobacco: Never  Vaping Use   Vaping Use: Never used  Substance and Sexual Activity   Alcohol use: Yes    Comment: occ   Drug use: Yes    Types: Marijuana   Sexual activity: Yes    Birth control/protection: None  Other Topics Concern   Not on file  Social History Narrative   Not on file   Social Determinants of Health   Financial Resource Strain: Low Risk  (09/09/2022)  Overall Financial Resource Strain (CARDIA)    Difficulty of Paying Living Expenses: Not hard at all  Food Insecurity: No Food Insecurity (09/22/2022)   Hunger Vital Sign    Worried About Running Out of Food in the Last Year: Never true    Ran Out of Food in the Last Year: Never true  Transportation Needs: No Transportation Needs (09/22/2022)   PRAPARE - Administrator, Civil Service (Medical): No    Lack of Transportation (Non-Medical): No  Recent Concern: Transportation Needs - Unmet Transportation Needs (09/09/2022)   PRAPARE - Transportation    Lack of Transportation (Medical): Yes    Lack of Transportation (Non-Medical): Yes  Physical Activity: Insufficiently Active (09/09/2022)   Exercise Vital Sign    Days of Exercise per Week: 2 days    Minutes of Exercise per Session: 20 min  Stress: No Stress Concern Present (09/09/2022)   [employer] of Occupational Health - Occupational Stress Questionnaire    Feeling of Stress : Only a little  Social Connections: Socially Isolated (09/09/2022)   Social Connection and Isolation Panel [NHANES]    Frequency of Communication with Friends and Family: Three times a week    Frequency of Social Gatherings with Friends and Family:  More than three times a week    Attends Religious Services: Never    Database administrator or Organizations: No    Attends Banker Meetings: Never    Marital Status: Separated  Intimate Partner Violence: Not At Risk (09/22/2022)   Humiliation, Afraid, Rape, and Kick questionnaire    Fear of Current or Ex-Partner: No    Emotionally Abused: No    Physically Abused: No    Sexually Abused: No    Family History: Family History  Problem Relation Age of Onset   Dermatomyositis Mother    Hypertension Mother    Cancer Mother        lung   Cancer Father        lung   Heart disease Father    Lung cancer Paternal Uncle    Lung cancer Maternal Uncle    Colon cancer Neg Hx    Breast cancer Neg Hx    Ovarian cancer Neg Hx    Endometrial cancer Neg Hx    Pancreatic cancer Neg Hx    Prostate cancer Neg Hx     Current Medications:  Current Outpatient Medications:    albuterol (VENTOLIN HFA) 108 (90 Base) MCG/ACT inhaler, Inhale 2 puffs into the lungs every 6 (six) hours as needed for wheezing or shortness of breath., Disp: 8 g, Rfl: 2   amLODipine (NORVASC) 10 MG tablet, Take 1 tablet (10 mg total) by mouth daily., Disp: 30 tablet, Rfl: 2   Atezolizumab (TECENTRIQ IV), Inject into the vein every 21 ( twenty-one) days., Disp: , Rfl:    Bevacizumab (AVASTIN IV), Inject into the vein every 21 ( twenty-one) days., Disp: , Rfl:    calcium carbonate (TUMS) 500 MG chewable tablet, Chew 1 tablet (200 mg of elemental calcium total) by mouth 3 (three) times daily with meals., Disp: 60 tablet, Rfl: 3   CISPLATIN IV, Inject into the vein every 21 ( twenty-one) days., Disp: , Rfl:    dexamethasone (DECADRON) 4 MG tablet, Take 2 tablets daily x 3 days starting the day after cisplatin chemotherapy. Take with food., Disp: 30 tablet, Rfl: 1   DULoxetine (CYMBALTA) 60 MG capsule, Take 1 capsule (60 mg total) by mouth daily., Disp: 30 capsule,  Rfl: 3   HYDROcodone-acetaminophen (NORCO/VICODIN)  5-325 MG tablet, Take 1 tablet by mouth every 8 (eight) hours as needed for moderate pain., Disp: 60 tablet, Rfl: 0   hydrOXYzine (ATARAX) 10 MG tablet, Take 1 tablet (10 mg total) by mouth 3 (three) times daily as needed., Disp: 30 tablet, Rfl: 0   lidocaine-prilocaine (EMLA) cream, Apply a dime-sized amount to port a cath site and cover with plastic wrap 1 hour prior to infusion appointments, Disp: 30 g, Rfl: 3   LORazepam (ATIVAN) 0.5 MG tablet, Take one tablet by mouth three hours prior to CT scan; take the second tablet by mouth upon arrival to radiology for procedure, Disp: 2 tablet, Rfl: 0   nicotine polacrilex (NICORETTE) 4 MG gum, Take 1 each (4 mg total) by mouth as needed for smoking cessation., Disp: 100 tablet, Rfl: 0   ondansetron (ZOFRAN) 8 MG tablet, Take 1 tablet (8 mg total) by mouth every 8 (eight) hours as needed for nausea or vomiting. Start on the third day after cisplatin., Disp: 30 tablet, Rfl: 1   PACLITAXEL IV, Inject into the vein every 21 ( twenty-one) days., Disp: , Rfl:    pantoprazole (PROTONIX) 40 MG tablet, Take 1 tablet (40 mg total) by mouth daily., Disp: 90 tablet, Rfl: 1   predniSONE (DELTASONE) 5 MG tablet, Take 1 tablet (5 mg total) by mouth daily with breakfast., Disp: 30 tablet, Rfl: 2   prochlorperazine (COMPAZINE) 10 MG tablet, Take 1 tablet (10 mg total) by mouth every 6 (six) hours as needed (Nausea or vomiting)., Disp: 30 tablet, Rfl: 1   triamcinolone cream (KENALOG) 0.1 %, Apply 1 Application topically 2 (two) times daily., Disp: 30 g, Rfl: 0   Allergies: Allergies  Allergen Reactions   Carrot [Daucus Carota] Hives   Lisinopril-Hydrochlorothiazide     Oral swelling   Ibuprofen Other (See Comments)    Oral swelling   Tramadol Hives   Erythromycin Hives    REVIEW OF SYSTEMS:   Review of Systems  Constitutional:  Negative for chills, fatigue and fever.  HENT:   Negative for lump/mass, mouth sores, nosebleeds, sore throat and trouble swallowing.    Eyes:  Negative for eye problems.  Respiratory:  Positive for cough and shortness of breath.   Cardiovascular:  Negative for chest pain, leg swelling and palpitations.  Gastrointestinal:  Positive for diarrhea and nausea. Negative for abdominal pain, constipation and vomiting.  Genitourinary:  Negative for bladder incontinence, difficulty urinating, dysuria, frequency, hematuria and nocturia.   Musculoskeletal:  Negative for arthralgias, back pain, flank pain, myalgias and neck pain.  Skin:  Negative for itching and rash.  Neurological:  Positive for numbness. Negative for dizziness and headaches.  Hematological:  Does not bruise/bleed easily.  Psychiatric/Behavioral:  Negative for depression, sleep disturbance and suicidal ideas. The patient is not nervous/anxious.   All other systems reviewed and are negative.    VITALS:   Blood pressure 130/88, pulse (!) 113.  Wt Readings from Last 3 Encounters:  12/03/22 180 lb 9.6 oz (81.9 kg)  11/18/22 182 lb 3.2 oz (82.6 kg)  11/13/22 190 lb 3.2 oz (86.3 kg)    There is no height or weight on file to calculate BMI.  Performance status (ECOG): 1 - Symptomatic but completely ambulatory  PHYSICAL EXAM:   Physical Exam Vitals and nursing note reviewed. Exam conducted with a chaperone present.  Constitutional:      Appearance: Normal appearance.  Cardiovascular:     Rate and Rhythm: Normal  rate and regular rhythm.     Pulses: Normal pulses.     Heart sounds: Normal heart sounds.  Pulmonary:     Effort: Pulmonary effort is normal.     Breath sounds: Normal breath sounds.  Abdominal:     Palpations: Abdomen is soft. There is no hepatomegaly, splenomegaly or mass.     Tenderness: There is no abdominal tenderness.  Musculoskeletal:     Right lower leg: No edema.     Left lower leg: No edema.  Lymphadenopathy:     Cervical: No cervical adenopathy.     Right cervical: No superficial, deep or posterior cervical adenopathy.    Left  cervical: No superficial, deep or posterior cervical adenopathy.     Upper Body:     Right upper body: No supraclavicular or axillary adenopathy.     Left upper body: No supraclavicular or axillary adenopathy.  Neurological:     General: No focal deficit present.     Mental Status: She is alert and oriented to person, place, and time.  Psychiatric:        Mood and Affect: Mood normal.        Behavior: Behavior normal.     LABS:      Latest Ref Rng & Units 12/24/2022    2:50 PM 12/03/2022    1:41 PM 11/12/2022    2:27 PM  CBC  WBC 4.0 - 10.5 K/uL 5.8  8.7  8.8   Hemoglobin 12.0 - 15.0 g/dL 60.4  54.0  9.8   Hematocrit 36.0 - 46.0 % 33.6  33.3  30.5   Platelets 150 - 400 K/uL 290  258  233       Latest Ref Rng & Units 12/24/2022    2:50 PM 12/03/2022    1:41 PM 11/12/2022    2:27 PM  CMP  Glucose 70 - 99 mg/dL 981  97  95   BUN 6 - 20 mg/dL 9  9  9    Creatinine 0.44 - 1.00 mg/dL 1.91  4.78  2.95   Sodium 135 - 145 mmol/L 135  134  132   Potassium 3.5 - 5.1 mmol/L 3.9  3.7  3.7   Chloride 98 - 111 mmol/L 104  100  99   CO2 22 - 32 mmol/L 22  23  22    Calcium 8.9 - 10.3 mg/dL 8.8  8.6  8.4   Total Protein 6.5 - 8.1 g/dL 6.9  6.6  6.6   Total Bilirubin 0.3 - 1.2 mg/dL 0.4  0.4  0.7   Alkaline Phos 38 - 126 U/L 72  77  71   AST 15 - 41 U/L 20  23  17    ALT 0 - 44 U/L 13  13  10       No results found for: "CEA1", "CEA" / No results found for: "CEA1", "CEA" No results found for: "PSA1" No results found for: "AOZ308" No results found for: "CAN125"  No results found for: "TOTALPROTELP", "ALBUMINELP", "A1GS", "A2GS", "BETS", "BETA2SER", "GAMS", "MSPIKE", "SPEI" Lab Results  Component Value Date   TIBC 412 09/22/2022   FERRITIN 35 09/22/2022   IRONPCTSAT 7 (L) 09/22/2022   No results found for: "LDH"   STUDIES:   No results found.

## 2022-12-24 NOTE — Progress Notes (Signed)
Port flushed with good blood return noted. No bruising or swelling at site. Bandaid applied and patient discharged in satisfactory condition. VVS stable with no signs or symptoms of distressed noted. 

## 2022-12-24 NOTE — Patient Instructions (Signed)
[facility]  Discharge Instructions  You were seen and examined today by Dr. Ellin Saba.  Dr. Ellin Saba discussed your most recent lab work which revealed that everything looks stable.  Dr. Ellin Saba sent referral to nutritionist to see if she can help with boost or ensure.   Discontinue the lexapro and start taking cymbalta since this will help with depression and the numbness/ tingling in your feet.  Follow-up as scheduled.    Thank you for choosing [facility] to provide your oncology and hematology care.   To afford each patient quality time with our provider, please arrive at least 15 minutes before your scheduled appointment time. You may need to reschedule your appointment if you arrive late (10 or more minutes). Arriving late affects you and other patients whose appointments are after yours.  Also, if you miss three or more appointments without notifying the office, you may be dismissed from the clinic at the provider's discretion.    Again, thank you for choosing [facility].  Our hope is that these requests will decrease the amount of time that you wait before being seen by our physicians.   If you have a lab appointment with the Cancer Center - please note that after April 8th, all labs will be drawn in the cancer center.  You do not have to check in or register with the main entrance as you have in the past but will complete your check-in at the cancer center.            _____________________________________________________________  Should you have questions after your visit to [facility], please contact our office at [phone] and follow the prompts.  Our office hours are 8:00 a.m. to 4:30 p.m. Monday - Thursday and 8:00 a.m. to 2:30 p.m. Friday.  Please note that voicemails left after 4:00 p.m. may not be returned until the following business day.  We are closed weekends and all major  holidays.  You do have access to a nurse 24-7, just call the main number to the clinic [phone] and do not press any options, hold on the line and a nurse will answer the phone.    For prescription refill requests, have your pharmacy contact our office and allow 72 hours.    Masks are no longer required in the cancer centers. If you would like for your care team to wear a mask while they are taking care of you, please let them know. You may have one support person who is at least 50 years old accompany you for your appointments.

## 2022-12-25 ENCOUNTER — Inpatient Hospital Stay: Payer: Medicaid Other

## 2022-12-25 VITALS — BP 147/103 | HR 128 | Temp 97.7°F | Resp 22 | Wt 178.4 lb

## 2022-12-25 DIAGNOSIS — F17210 Nicotine dependence, cigarettes, uncomplicated: Secondary | ICD-10-CM | POA: Diagnosis not present

## 2022-12-25 DIAGNOSIS — C539 Malignant neoplasm of cervix uteri, unspecified: Secondary | ICD-10-CM | POA: Diagnosis not present

## 2022-12-25 DIAGNOSIS — I110 Hypertensive heart disease with heart failure: Secondary | ICD-10-CM | POA: Diagnosis not present

## 2022-12-25 DIAGNOSIS — C538 Malignant neoplasm of overlapping sites of cervix uteri: Secondary | ICD-10-CM

## 2022-12-25 DIAGNOSIS — C786 Secondary malignant neoplasm of retroperitoneum and peritoneum: Secondary | ICD-10-CM | POA: Diagnosis not present

## 2022-12-25 DIAGNOSIS — Z5112 Encounter for antineoplastic immunotherapy: Secondary | ICD-10-CM | POA: Diagnosis not present

## 2022-12-25 DIAGNOSIS — R21 Rash and other nonspecific skin eruption: Secondary | ICD-10-CM | POA: Diagnosis not present

## 2022-12-25 DIAGNOSIS — Z5111 Encounter for antineoplastic chemotherapy: Secondary | ICD-10-CM | POA: Diagnosis not present

## 2022-12-25 DIAGNOSIS — I509 Heart failure, unspecified: Secondary | ICD-10-CM | POA: Diagnosis not present

## 2022-12-25 DIAGNOSIS — D649 Anemia, unspecified: Secondary | ICD-10-CM | POA: Diagnosis not present

## 2022-12-25 DIAGNOSIS — C787 Secondary malignant neoplasm of liver and intrahepatic bile duct: Secondary | ICD-10-CM | POA: Diagnosis not present

## 2022-12-25 DIAGNOSIS — C7802 Secondary malignant neoplasm of left lung: Secondary | ICD-10-CM | POA: Diagnosis not present

## 2022-12-25 DIAGNOSIS — Z79633 Long term (current) use of mitotic inhibitor: Secondary | ICD-10-CM | POA: Diagnosis not present

## 2022-12-25 DIAGNOSIS — C7801 Secondary malignant neoplasm of right lung: Secondary | ICD-10-CM | POA: Diagnosis not present

## 2022-12-25 DIAGNOSIS — I10 Essential (primary) hypertension: Secondary | ICD-10-CM

## 2022-12-25 DIAGNOSIS — Z95828 Presence of other vascular implants and grafts: Secondary | ICD-10-CM

## 2022-12-25 MED ORDER — SODIUM CHLORIDE 0.9 % IV SOLN
150.0000 mg | Freq: Once | INTRAVENOUS | Status: AC
  Administered 2022-12-25: 150 mg via INTRAVENOUS
  Filled 2022-12-25: qty 150

## 2022-12-25 MED ORDER — SODIUM CHLORIDE 0.9 % IV SOLN
135.0000 mg/m2 | Freq: Once | INTRAVENOUS | Status: AC
  Administered 2022-12-25: 276 mg via INTRAVENOUS
  Filled 2022-12-25: qty 46

## 2022-12-25 MED ORDER — SODIUM CHLORIDE 0.9% FLUSH
10.0000 mL | INTRAVENOUS | Status: DC | PRN
  Administered 2022-12-25: 10 mL

## 2022-12-25 MED ORDER — SODIUM CHLORIDE 0.9 % IV SOLN: Freq: Once | INTRAVENOUS | Status: AC

## 2022-12-25 MED ORDER — SODIUM CHLORIDE 0.9 % IV SOLN
15.0000 mg/kg | Freq: Once | INTRAVENOUS | Status: AC
  Administered 2022-12-25: 1300 mg via INTRAVENOUS
  Filled 2022-12-25: qty 48

## 2022-12-25 MED ORDER — FAMOTIDINE IN NACL 20-0.9 MG/50ML-% IV SOLN
20.0000 mg | Freq: Once | INTRAVENOUS | Status: AC
  Administered 2022-12-25: 20 mg via INTRAVENOUS
  Filled 2022-12-25: qty 50

## 2022-12-25 MED ORDER — SODIUM CHLORIDE 0.9 % IV SOLN
50.0000 mg/m2 | Freq: Once | INTRAVENOUS | Status: AC
  Administered 2022-12-25: 100 mg via INTRAVENOUS
  Filled 2022-12-25: qty 100

## 2022-12-25 MED ORDER — POTASSIUM CHLORIDE IN NACL 20-0.9 MEQ/L-% IV SOLN
Freq: Once | INTRAVENOUS | Status: AC
  Filled 2022-12-25: qty 1000

## 2022-12-25 MED ORDER — PALONOSETRON HCL INJECTION 0.25 MG/5ML
0.2500 mg | Freq: Once | INTRAVENOUS | Status: AC
  Administered 2022-12-25: 0.25 mg via INTRAVENOUS
  Filled 2022-12-25: qty 5

## 2022-12-25 MED ORDER — MAGNESIUM SULFATE 2 GM/50ML IV SOLN
2.0000 g | Freq: Once | INTRAVENOUS | Status: AC
  Administered 2022-12-25: 2 g via INTRAVENOUS
  Filled 2022-12-25: qty 50

## 2022-12-25 MED ORDER — CLONIDINE HCL 0.1 MG PO TABS
0.2000 mg | ORAL_TABLET | Freq: Once | ORAL | Status: AC
  Administered 2022-12-25: 0.2 mg via ORAL
  Filled 2022-12-25: qty 2

## 2022-12-25 MED ORDER — SODIUM CHLORIDE 0.9 % IV SOLN
10.0000 mg | Freq: Once | INTRAVENOUS | Status: AC
  Administered 2022-12-25: 10 mg via INTRAVENOUS
  Filled 2022-12-25: qty 10

## 2022-12-25 MED ORDER — CETIRIZINE HCL 10 MG/ML IV SOLN
10.0000 mg | Freq: Once | INTRAVENOUS | Status: AC
  Administered 2022-12-25: 10 mg via INTRAVENOUS
  Filled 2022-12-25: qty 1

## 2022-12-25 MED ORDER — SODIUM CHLORIDE 0.9 % IV SOLN
200.0000 mg | Freq: Once | INTRAVENOUS | Status: AC
  Administered 2022-12-25: 200 mg via INTRAVENOUS
  Filled 2022-12-25: qty 8

## 2022-12-25 MED ORDER — HEPARIN SOD (PORK) LOCK FLUSH 100 UNIT/ML IV SOLN
500.0000 [IU] | Freq: Once | INTRAVENOUS | Status: AC | PRN
  Administered 2022-12-25: 500 [IU]

## 2022-12-25 NOTE — Progress Notes (Signed)
Patient tolerated treatment well with no complaints voiced.  BP at completion of treatment was 169/100 and HR was 135.  At recheck BP was 147/103 and HR was 128.  MD made aware.  We will give patient Clonidine 0.2 mg PO  x one dose per Dr. Ellin Saba.  No need to monitor patient for 30 minutes post Clonidine per Dr. Ellin Saba.  Patient advised to keep a check on BP at home and report to the ED with new or alarming symptoms.   Patient left ambulatory in stable condition.  Follow up as scheduled.

## 2022-12-25 NOTE — Patient Instructions (Signed)
[facility]  Discharge Instructions: Thank you for choosing [facility] to provide your oncology and hematology care.  If you have a lab appointment with the Cancer Center - please note that after April 8th, 2024, all labs will be drawn in the cancer center.  You do not have to check in or register with the main entrance as you have in the past but will complete your check-in in the cancer center.  Wear comfortable clothing and clothing appropriate for easy access to any Portacath or PICC line.   We strive to give you quality time with your provider. You may need to reschedule your appointment if you arrive late (15 or more minutes).  Arriving late affects you and other patients whose appointments are after yours.  Also, if you miss three or more appointments without notifying the office, you may be dismissed from the clinic at the provider's discretion.      For prescription refill requests, have your pharmacy contact our office and allow 72 hours for refills to be completed.    Today you received the following chemotherapy and/or immunotherapy agents Keytruda/Vegzelma/Taxol/Cisplatin.  Pembrolizumab Injection What is this medication? PEMBROLIZUMAB (PEM broe LIZ ue mab) treats some types of cancer. It works by helping your immune system slow or stop the spread of cancer cells. It is a monoclonal antibody. This medicine may be used for other purposes; ask your health care provider or pharmacist if you have questions. COMMON BRAND NAME(S): Keytruda What should I tell my care team before I take this medication? They need to know if you have any of these conditions: Allogeneic stem cell transplant (uses someone else's stem cells) Autoimmune diseases, such as Crohn disease, ulcerative colitis, lupus History of chest radiation Nervous system problems, such as Guillain-Barre syndrome, myasthenia gravis Organ transplant An unusual or allergic reaction to  pembrolizumab, other medications, foods, dyes, or preservatives Pregnant or trying to get pregnant Breast-feeding How should I use this medication? This medication is injected into a vein. It is given by your care team in a hospital or clinic setting. A special MedGuide will be given to you before each treatment. Be sure to read this information carefully each time. Talk to your care team about the use of this medication in children. While it may be prescribed for children as young as 6 months for selected conditions, precautions do apply. Overdosage: If you think you have taken too much of this medicine contact a poison control center or emergency room at once. NOTE: This medicine is only for you. Do not share this medicine with others. What if I miss a dose? Keep appointments for follow-up doses. It is important not to miss your dose. Call your care team if you are unable to keep an appointment. What may interact with this medication? Interactions have not been studied. This list may not describe all possible interactions. Give your health care provider a list of all the medicines, herbs, non-prescription drugs, or dietary supplements you use. Also tell them if you smoke, drink alcohol, or use illegal drugs. Some items may interact with your medicine. What should I watch for while using this medication? Your condition will be monitored carefully while you are receiving this medication. You may need blood work while taking this medication. This medication may cause serious skin reactions. They can happen weeks to months after starting the medication. Contact your care team right away if you notice fevers or flu-like symptoms with a rash. The  rash may be red or purple and then turn into blisters or peeling of the skin. You may also notice a red rash with swelling of the face, lips, or lymph nodes in your neck or under your arms. Tell your care team right away if you have any change in your  eyesight. Talk to your care team if you may be pregnant. Serious birth defects can occur if you take this medication during pregnancy and for 4 months after the last dose. You will need a negative pregnancy test before starting this medication. Contraception is recommended while taking this medication and for 4 months after the last dose. Your care team can help you find the option that works for you. Do not breastfeed while taking this medication and for 4 months after the last dose. What side effects may I notice from receiving this medication? Side effects that you should report to your care team as soon as possible: Allergic reactions--skin rash, itching, hives, swelling of the face, lips, tongue, or throat Dry cough, shortness of breath or trouble breathing Eye pain, redness, irritation, or discharge with blurry or decreased vision Heart muscle inflammation--unusual weakness or fatigue, shortness of breath, chest pain, fast or irregular heartbeat, dizziness, swelling of the ankles, feet, or hands Hormone gland problems--headache, sensitivity to light, unusual weakness or fatigue, dizziness, fast or irregular heartbeat, increased sensitivity to cold or heat, excessive sweating, constipation, hair loss, increased thirst or amount of urine, tremors or shaking, irritability Infusion reactions--chest pain, shortness of breath or trouble breathing, feeling faint or lightheaded Kidney injury (glomerulonephritis)--decrease in the amount of urine, red or dark Jaidynn Balster urine, foamy or bubbly urine, swelling of the ankles, hands, or feet Liver injury--right upper belly pain, loss of appetite, nausea, light-colored stool, dark yellow or Cristian Grieves urine, yellowing skin or eyes, unusual weakness or fatigue Pain, tingling, or numbness in the hands or feet, muscle weakness, change in vision, confusion or trouble speaking, loss of balance or coordination, trouble walking, seizures Rash, fever, and swollen lymph  nodes Redness, blistering, peeling, or loosening of the skin, including inside the mouth Sudden or severe stomach pain, bloody diarrhea, fever, nausea, vomiting Side effects that usually do not require medical attention (report to your care team if they continue or are bothersome): Bone, joint, or muscle pain Diarrhea Fatigue Loss of appetite Nausea Skin rash This list may not describe all possible side effects. Call your doctor for medical advice about side effects. You may report side effects to FDA at 1-800-FDA-1088. Where should I keep my medication? This medication is given in a hospital or clinic. It will not be stored at home. NOTE: This sheet is a summary. It may not cover all possible information. If you have questions about this medicine, talk to your doctor, pharmacist, or health care provider.  2023 Elsevier/Gold Standard (2021-12-31 00:00:00)    Paclitaxel Injection What is this medication? PACLITAXEL (PAK li TAX el) treats some types of cancer. It works by slowing down the growth of cancer cells. This medicine may be used for other purposes; ask your health care provider or pharmacist if you have questions. COMMON BRAND NAME(S): Onxol, Taxol What should I tell my care team before I take this medication? They need to know if you have any of these conditions: Heart disease Liver disease Low white blood cell levels An unusual or allergic reaction to paclitaxel, other medications, foods, dyes, or preservatives If you or your partner are pregnant or trying to get pregnant Breast-feeding How should I  use this medication? This medication is injected into a vein. It is given by your care team in a hospital or clinic setting. Talk to your care team about the use of this medication in children. While it may be given to children for selected conditions, precautions do apply. Overdosage: If you think you have taken too much of this medicine contact a poison control center or  emergency room at once. NOTE: This medicine is only for you. Do not share this medicine with others. What if I miss a dose? Keep appointments for follow-up doses. It is important not to miss your dose. Call your care team if you are unable to keep an appointment. What may interact with this medication? Do not take this medication with any of the following: Live virus vaccines Other medications may affect the way this medication works. Talk with your care team about all of the medications you take. They may suggest changes to your treatment plan to lower the risk of side effects and to make sure your medications work as intended. This list may not describe all possible interactions. Give your health care provider a list of all the medicines, herbs, non-prescription drugs, or dietary supplements you use. Also tell them if you smoke, drink alcohol, or use illegal drugs. Some items may interact with your medicine. What should I watch for while using this medication? Your condition will be monitored carefully while you are receiving this medication. You may need blood work while taking this medication. This medication may make you feel generally unwell. This is not uncommon as chemotherapy can affect healthy cells as well as cancer cells. Report any side effects. Continue your course of treatment even though you feel ill unless your care team tells you to stop. This medication can cause serious allergic reactions. To reduce the risk, your care team may give you other medications to take before receiving this one. Be sure to follow the directions from your care team. This medication may increase your risk of getting an infection. Call your care team for advice if you get a fever, chills, sore throat, or other symptoms of a cold or flu. Do not treat yourself. Try to avoid being around people who are sick. This medication may increase your risk to bruise or bleed. Call your care team if you notice any unusual  bleeding. Be careful brushing or flossing your teeth or using a toothpick because you may get an infection or bleed more easily. If you have any dental work done, tell your dentist you are receiving this medication. Talk to your care team if you may be pregnant. Serious birth defects can occur if you take this medication during pregnancy. Talk to your care team before breastfeeding. Changes to your treatment plan may be needed. What side effects may I notice from receiving this medication? Side effects that you should report to your care team as soon as possible: Allergic reactions--skin rash, itching, hives, swelling of the face, lips, tongue, or throat Heart rhythm changes--fast or irregular heartbeat, dizziness, feeling faint or lightheaded, chest pain, trouble breathing Increase in blood pressure Infection--fever, chills, cough, sore throat, wounds that don't heal, pain or trouble when passing urine, general feeling of discomfort or being unwell Low blood pressure--dizziness, feeling faint or lightheaded, blurry vision Low red blood cell level--unusual weakness or fatigue, dizziness, headache, trouble breathing Painful swelling, warmth, or redness of the skin, blisters or sores at the infusion site Pain, tingling, or numbness in the hands or feet Slow  heartbeat--dizziness, feeling faint or lightheaded, confusion, trouble breathing, unusual weakness or fatigue Unusual bruising or bleeding Side effects that usually do not require medical attention (report to your care team if they continue or are bothersome): Diarrhea Hair loss Joint pain Loss of appetite Muscle pain Nausea Vomiting This list may not describe all possible side effects. Call your doctor for medical advice about side effects. You may report side effects to FDA at 1-800-FDA-1088. Where should I keep my medication? This medication is given in a hospital or clinic. It will not be stored at home. NOTE: This sheet is a summary.  It may not cover all possible information. If you have questions about this medicine, talk to your doctor, pharmacist, or health care provider.  2023 Elsevier/Gold Standard (2022-01-02 00:00:00)    Cisplatin Injection What is this medication? CISPLATIN (SIS pla tin) treats some types of cancer. It works by slowing down the growth of cancer cells. This medicine may be used for other purposes; ask your health care provider or pharmacist if you have questions. COMMON BRAND NAME(S): Platinol, Platinol -AQ What should I tell my care team before I take this medication? They need to know if you have any of these conditions: Eye disease, vision problems Hearing problems Kidney disease Low blood counts, such as low white cells, platelets, or red blood cells Tingling of the fingers or toes, or other nerve disorder An unusual or allergic reaction to cisplatin, carboplatin, oxaliplatin, other medications, foods, dyes, or preservatives If you or your partner are pregnant or trying to get pregnant Breast-feeding How should I use this medication? This medication is injected into a vein. It is given by your care team in a hospital or clinic setting. Talk to your care team about the use of this medication in children. Special care may be needed. Overdosage: If you think you have taken too much of this medicine contact a poison control center or emergency room at once. NOTE: This medicine is only for you. Do not share this medicine with others. What if I miss a dose? Keep appointments for follow-up doses. It is important not to miss your dose. Call your care team if you are unable to keep an appointment. What may interact with this medication? Do not take this medication with any of the following: Live virus vaccines This medication may also interact with the following: Certain antibiotics, such as amikacin, gentamicin, neomycin, polymyxin B, streptomycin, tobramycin, vancomycin Foscarnet This list  may not describe all possible interactions. Give your health care provider a list of all the medicines, herbs, non-prescription drugs, or dietary supplements you use. Also tell them if you smoke, drink alcohol, or use illegal drugs. Some items may interact with your medicine. What should I watch for while using this medication? Your condition will be monitored carefully while you are receiving this medication. You may need blood work done while taking this medication. This medication may make you feel generally unwell. This is not uncommon, as chemotherapy can affect healthy cells as well as cancer cells. Report any side effects. Continue your course of treatment even though you feel ill unless your care team tells you to stop. This medication may increase your risk of getting an infection. Call your care team for advice if you get a fever, chills, sore throat, or other symptoms of a cold or flu. Do not treat yourself. Try to avoid being around people who are sick. Avoid taking medications that contain aspirin, acetaminophen, ibuprofen, naproxen, or ketoprofen unless instructed  by your care team. These medications may hide a fever. This medication may increase your risk to bruise or bleed. Call your care team if you notice any unusual bleeding. Be careful brushing or flossing your teeth or using a toothpick because you may get an infection or bleed more easily. If you have any dental work done, tell your dentist you are receiving this medication. Drink fluids as directed while you are taking this medication. This will help protect your kidneys. Call your care team if you get diarrhea. Do not treat yourself. Talk to your care team if you or your partner wish to become pregnant or think you might be pregnant. This medication can cause serious birth defects if taken during pregnancy and for 14 months after the last dose. A negative pregnancy test is required before starting this medication. A reliable form of  contraception is recommended while taking this medication and for 14 months after the last dose. Talk to your care team about effective forms of contraception. Do not father a child while taking this medication and for 11 months after the last dose. Use a condom during sex during this time period. Do not breast-feed while taking this medication. This medication may cause infertility. Talk to your care team if you are concerned about your fertility. What side effects may I notice from receiving this medication? Side effects that you should report to your care team as soon as possible: Allergic reactions--skin rash, itching, hives, swelling of the face, lips, tongue, or throat Eye pain, change in vision, vision loss Hearing loss, ringing in ears Infection--fever, chills, cough, sore throat, wounds that don't heal, pain or trouble when passing urine, general feeling of discomfort or being unwell Kidney injury--decrease in the amount of urine, swelling of the ankles, hands, or feet Low red blood cell level--unusual weakness or fatigue, dizziness, headache, trouble breathing Painful swelling, warmth, or redness of the skin, blisters or sores at the infusion site Pain, tingling, or numbness in the hands or feet Unusual bruising or bleeding Side effects that usually do not require medical attention (report to your care team if they continue or are bothersome): Hair loss Nausea Vomiting This list may not describe all possible side effects. Call your doctor for medical advice about side effects. You may report side effects to FDA at 1-800-FDA-1088. Where should I keep my medication? This medication is given in a hospital or clinic. It will not be stored at home. NOTE: This sheet is a summary. It may not cover all possible information. If you have questions about this medicine, talk to your doctor, pharmacist, or health care provider.  2023 Elsevier/Gold Standard (2021-12-13 00:00:00)        To help  prevent nausea and vomiting after your treatment, we encourage you to take your nausea medication as directed.  BELOW ARE SYMPTOMS THAT SHOULD BE REPORTED IMMEDIATELY: *FEVER GREATER THAN 100.4 F (38 C) OR HIGHER *CHILLS OR SWEATING *NAUSEA AND VOMITING THAT IS NOT CONTROLLED WITH YOUR NAUSEA MEDICATION *UNUSUAL SHORTNESS OF BREATH *UNUSUAL BRUISING OR BLEEDING *URINARY PROBLEMS (pain or burning when urinating, or frequent urination) *BOWEL PROBLEMS (unusual diarrhea, constipation, pain near the anus) TENDERNESS IN MOUTH AND THROAT WITH OR WITHOUT PRESENCE OF ULCERS (sore throat, sores in mouth, or a toothache) UNUSUAL RASH, SWELLING OR PAIN  UNUSUAL VAGINAL DISCHARGE OR ITCHING   Items with * indicate a potential emergency and should be followed up as soon as possible or go to the Emergency Department if any problems should  occur.  Please show the CHEMOTHERAPY ALERT CARD or IMMUNOTHERAPY ALERT CARD at check-in to the Emergency Department and triage nurse.  Should you have questions after your visit or need to cancel or reschedule your appointment, please contact [facility] [phone]  and follow the prompts.  Office hours are 8:00 a.m. to 4:30 p.m. Monday - Friday. Please note that voicemails left after 4:00 p.m. may not be returned until the following business day.  We are closed weekends and major holidays. You have access to a nurse at all times for urgent questions. Please call the main number to the clinic [phone] and follow the prompts.  For any non-urgent questions, you may also contact your provider using MyChart. We now offer e-Visits for anyone 21 and older to request care online for non-urgent symptoms. For details visit [email or web address].   Also download the MyChart app! Go to the app store, search "MyChart", open the app, select [place], and log in with your MyChart username and password.

## 2022-12-25 NOTE — Progress Notes (Signed)
Patient presents today for Keytruda, vegzelma, Taxol, and Cisplatin chemotherapy infusion.  Patient is in satisfactory condition with no new complaints voiced.  Vital signs are stable.  Labs reviewed and all labs are within treatment parameters.  Patient voided of urine prior to start of Cisplatin. We will proceed with treatment per MD orders.

## 2022-12-29 ENCOUNTER — Ambulatory Visit (HOSPITAL_COMMUNITY)
Admission: RE | Admit: 2022-12-29 | Discharge: 2022-12-29 | Disposition: A | Discharge status: Home / Self Care | Payer: Medicaid Other | Source: Ambulatory Visit | Attending: Hematology | Admitting: Hematology

## 2022-12-29 DIAGNOSIS — C538 Malignant neoplasm of overlapping sites of cervix uteri: Secondary | ICD-10-CM

## 2022-12-29 MED ORDER — IOHEXOL 300 MG/ML  SOLN
100.0000 mL | Freq: Once | INTRAMUSCULAR | Status: AC | PRN
  Administered 2022-12-29: 100 mL via INTRAVENOUS

## 2022-12-29 MED ORDER — IOHEXOL 9 MG/ML PO SOLN
ORAL | Status: AC
  Filled 2022-12-29: qty 1000

## 2022-12-31 DIAGNOSIS — Z419 Encounter for procedure for purposes other than remedying health state, unspecified: Secondary | ICD-10-CM | POA: Diagnosis not present

## 2023-01-01 ENCOUNTER — Other Ambulatory Visit: Payer: Self-pay | Admitting: Hematology

## 2023-01-01 MED ORDER — MEGESTROL ACETATE 400 MG/10ML PO SUSP: 400.0000 mg | Freq: Two times a day (BID) | ORAL | 1 refills | Status: DC

## 2023-01-12 ENCOUNTER — Inpatient Hospital Stay: Payer: Medicaid Other | Attending: Hematology | Admitting: Dietician

## 2023-01-12 ENCOUNTER — Telehealth: Payer: Self-pay | Admitting: Dietician

## 2023-01-12 DIAGNOSIS — R7989 Other specified abnormal findings of blood chemistry: Secondary | ICD-10-CM | POA: Insufficient documentation

## 2023-01-12 DIAGNOSIS — C787 Secondary malignant neoplasm of liver and intrahepatic bile duct: Secondary | ICD-10-CM | POA: Insufficient documentation

## 2023-01-12 DIAGNOSIS — R87810 Cervical high risk human papillomavirus (HPV) DNA test positive: Secondary | ICD-10-CM | POA: Insufficient documentation

## 2023-01-12 DIAGNOSIS — C786 Secondary malignant neoplasm of retroperitoneum and peritoneum: Secondary | ICD-10-CM | POA: Insufficient documentation

## 2023-01-12 DIAGNOSIS — C539 Malignant neoplasm of cervix uteri, unspecified: Secondary | ICD-10-CM | POA: Insufficient documentation

## 2023-01-12 DIAGNOSIS — C7802 Secondary malignant neoplasm of left lung: Secondary | ICD-10-CM | POA: Insufficient documentation

## 2023-01-12 DIAGNOSIS — C7801 Secondary malignant neoplasm of right lung: Secondary | ICD-10-CM | POA: Insufficient documentation

## 2023-01-12 DIAGNOSIS — F17210 Nicotine dependence, cigarettes, uncomplicated: Secondary | ICD-10-CM | POA: Insufficient documentation

## 2023-01-12 DIAGNOSIS — D649 Anemia, unspecified: Secondary | ICD-10-CM | POA: Insufficient documentation

## 2023-01-12 DIAGNOSIS — R0981 Nasal congestion: Secondary | ICD-10-CM | POA: Insufficient documentation

## 2023-01-12 DIAGNOSIS — G629 Polyneuropathy, unspecified: Secondary | ICD-10-CM | POA: Insufficient documentation

## 2023-01-12 DIAGNOSIS — Z801 Family history of malignant neoplasm of trachea, bronchus and lung: Secondary | ICD-10-CM | POA: Insufficient documentation

## 2023-01-12 NOTE — Telephone Encounter (Signed)
Attempted to contact patient for scheduled nutrition appointment. Patient did not answer. Left VM with request for return call. Contact information provided.

## 2023-01-14 ENCOUNTER — Encounter (HOSPITAL_COMMUNITY): Payer: Self-pay | Admitting: *Deleted

## 2023-01-14 ENCOUNTER — Emergency Department (HOSPITAL_COMMUNITY): Payer: Medicaid Other

## 2023-01-14 ENCOUNTER — Other Ambulatory Visit: Payer: Self-pay

## 2023-01-14 ENCOUNTER — Emergency Department (HOSPITAL_COMMUNITY)
Admission: EM | Admit: 2023-01-14 | Discharge: 2023-01-14 | Disposition: A | Discharge status: Home / Self Care | Payer: Medicaid Other | Attending: Emergency Medicine | Admitting: Emergency Medicine

## 2023-01-14 DIAGNOSIS — I10 Essential (primary) hypertension: Secondary | ICD-10-CM | POA: Diagnosis not present

## 2023-01-14 DIAGNOSIS — Z1152 Encounter for screening for COVID-19: Secondary | ICD-10-CM | POA: Diagnosis not present

## 2023-01-14 DIAGNOSIS — G43809 Other migraine, not intractable, without status migrainosus: Secondary | ICD-10-CM | POA: Insufficient documentation

## 2023-01-14 DIAGNOSIS — R519 Headache, unspecified: Secondary | ICD-10-CM | POA: Diagnosis not present

## 2023-01-14 LAB — CBC WITH DIFFERENTIAL/PLATELET
Abs Immature Granulocytes: 0.04 10*3/uL (ref 0.00–0.07)
Basophils Absolute: 0 10*3/uL (ref 0.0–0.1)
Basophils Relative: 0 %
Eosinophils Absolute: 0 10*3/uL (ref 0.0–0.5)
Eosinophils Relative: 0 %
HCT: 33.3 % — ABNORMAL LOW (ref 36.0–46.0)
Hemoglobin: 10.7 g/dL — ABNORMAL LOW (ref 12.0–15.0)
Immature Granulocytes: 1 %
Lymphocytes Relative: 14 %
Lymphs Abs: 1.1 10*3/uL (ref 0.7–4.0)
MCH: 31.1 pg (ref 26.0–34.0)
MCHC: 32.1 g/dL (ref 30.0–36.0)
MCV: 96.8 fL (ref 80.0–100.0)
Monocytes Absolute: 0.6 10*3/uL (ref 0.1–1.0)
Monocytes Relative: 7 %
Neutro Abs: 6 10*3/uL (ref 1.7–7.7)
Neutrophils Relative %: 78 %
Platelets: 195 10*3/uL (ref 150–400)
RBC: 3.44 MIL/uL — ABNORMAL LOW (ref 3.87–5.11)
RDW: 22.6 % — ABNORMAL HIGH (ref 11.5–15.5)
WBC: 7.7 10*3/uL (ref 4.0–10.5)
nRBC: 0 % (ref 0.0–0.2)

## 2023-01-14 LAB — RESP PANEL BY RT-PCR (RSV, FLU A&B, COVID)  RVPGX2
Influenza A by PCR: NEGATIVE
Influenza B by PCR: NEGATIVE
Resp Syncytial Virus by PCR: NEGATIVE
SARS Coronavirus 2 by RT PCR: NEGATIVE

## 2023-01-14 LAB — COMPREHENSIVE METABOLIC PANEL
ALT: 260 U/L — ABNORMAL HIGH (ref 0–44)
AST: 776 U/L — ABNORMAL HIGH (ref 15–41)
Albumin: 3.1 g/dL — ABNORMAL LOW (ref 3.5–5.0)
Alkaline Phosphatase: 77 U/L (ref 38–126)
Anion gap: 10 (ref 5–15)
BUN: 13 mg/dL (ref 6–20)
CO2: 20 mmol/L — ABNORMAL LOW (ref 22–32)
Calcium: 8.6 mg/dL — ABNORMAL LOW (ref 8.9–10.3)
Chloride: 103 mmol/L (ref 98–111)
Creatinine, Ser: 0.83 mg/dL (ref 0.44–1.00)
GFR, Estimated: >60 mL/min (ref >60)
Glucose, Bld: 91 mg/dL (ref 70–99)
Potassium: 4 mmol/L (ref 3.5–5.1)
Sodium: 133 mmol/L — ABNORMAL LOW (ref 135–145)
Total Bilirubin: 0.9 mg/dL (ref 0.3–1.2)
Total Protein: 6.8 g/dL (ref 6.5–8.1)

## 2023-01-14 MED ORDER — DIPHENHYDRAMINE HCL 50 MG/ML IJ SOLN
12.5000 mg | Freq: Once | INTRAMUSCULAR | Status: AC
  Administered 2023-01-14: 12.5 mg via INTRAVENOUS
  Filled 2023-01-14: qty 1

## 2023-01-14 MED ORDER — SODIUM CHLORIDE 0.9 % IV BOLUS
1000.0000 mL | Freq: Once | INTRAVENOUS | Status: AC
  Administered 2023-01-14: 1000 mL via INTRAVENOUS

## 2023-01-14 MED ORDER — HEPARIN SOD (PORK) LOCK FLUSH 100 UNIT/ML IV SOLN: 500.0000 [IU] | Freq: Once | INTRAVENOUS | Status: AC

## 2023-01-14 MED ORDER — HEPARIN SOD (PORK) LOCK FLUSH 100 UNIT/ML IV SOLN
INTRAVENOUS | Status: AC
  Administered 2023-01-14: 500 [IU]
  Filled 2023-01-14: qty 5

## 2023-01-14 MED ORDER — PROCHLORPERAZINE EDISYLATE 10 MG/2ML IJ SOLN
10.0000 mg | Freq: Once | INTRAMUSCULAR | Status: AC
  Administered 2023-01-14: 10 mg via INTRAVENOUS
  Filled 2023-01-14: qty 2

## 2023-01-14 NOTE — ED Notes (Signed)
Pt provided pillow. Family remains at bedside.

## 2023-01-14 NOTE — Discharge Instructions (Signed)
You are seen in the emergency department for a headache.  He had adequate response to migraine cocktail.  He did have findings of elevated liver enzymes on your blood work.  Please make sure that you inform your oncologist of these findings.  If you feel your symptoms are worsening please return to the emergency department.

## 2023-01-14 NOTE — ED Triage Notes (Signed)
Pt with HA x3 days and body aches x 2 days.  Denies any fevers. Pt is receiving chemo every two weeks, scheduled to have again tomorrow.

## 2023-01-14 NOTE — Progress Notes (Signed)
[facility] [address] [place], [name]    Clinic Day:  01/15/2023  Referring physician: Billie Lade, MD  Patient Care Team: Billie Lade, MD as PCP - General (Internal Medicine) Doreatha Massed, MD as Medical Oncologist (Medical Oncology) Therese Sarah, RN as Oncology Nurse Navigator (Medical Oncology)   ASSESSMENT & PLAN:   Assessment: 1.  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 - CTAP (08/30/2022): Irregularity of the endometrium extending through the cervix with foci of gas in the cervix.  Multifocal omental and peritoneal metastasis.  Metastatic left periaortic, aortocaval, left external iliac and right common iliac lymph nodes.  Multiple bilateral lung nodules at bases.  12 mm hypodensity in the liver near the falciform ligament. - Endometrial biopsy (09/09/2022): Invasive moderately to poorly differentiated squamous cell carcinoma with abundant necrosis.  Cervix biopsy: Invasive and in situ moderate to poorly differentiated squamous cell carcinoma.  High risk HPV positive. - NGS: PD-L1 (22 C3): CPS 2, HER2 IHC negative.  PIK3CA exon 10 pathogenic variant.  MS-stable.  TMB-low.  FANCM and SDHA pathogenic variants. - PD-L1 CPS: 8% - Cycle 1 of cisplatin, paclitaxel, bevacizumab on 10/02/2022, pembrolizumab added with cycle 2 on 10/23/2022 - Bone scan (10/14/2022): No metastatic disease   2.  Social/family history: - She is married and lives at home with her husband and roommate.  She has never worked.  She is current active smoker, half pack per day, for the last 28 years.  She occasionally smokes marijuana. - Mother died of lung cancer.  Father had head and neck cancer.  Maternal grandmother had cancer.  Paternal uncle died of cancer.  Maternal uncle also had cancer.    Plan: 1.   Metastatic cervical cancer to the lungs, peritoneum and liver: - She received cycle 5 of chemoimmunotherapy with Keytruda on 12/25/2022. - She reports nasal congestion for the past 1 week.  She went to the ER with severe headache yesterday.  They did a CT scan of the head which was negative for intracranial abnormality but showed extensive paranasal sinus disease. - I will hold her treatment due to elevated LFTs and sinusitis symptoms. - Will prescribe her Augmentin 875 mg twice daily for 7 days.   2.  Normocytic anemia: - She has been receiving intermittent Feraheme.  Hemoglobin today is 9.9.   3.  Elevated LFTs: - She has transaminitis with AST of 4430 and ALT of 1171.  She reportedly took Tylenol about 9 tablets daily in the last 3 to 4 days. - Differential diagnosis includes medication induced from [facility]. - Recommend ultrasound of the right upper quadrant as soon as possible.  She does not have any symptoms from it.   4.  Hypertension: - Continue amlodipine 10 mg daily.  Blood pressure is 130/84.   5.  Weight loss: - Weight is stable since last visit.   6.  Peripheral neuropathy: - Continue Cymbalta 60 mg daily.  Numbness is stable..    Orders Placed This Encounter  Procedures   US Abdomen Limited RUQ (LIVER/GB)    Standing Status:   Future    Standing Expiration Date:   01/15/2024    Order Specific Question:   Reason for Exam (SYMPTOM  OR DIAGNOSIS REQUIRED)    Answer:   elevated LFTs    Order Specific  Question:   Preferred imaging location?    Answer:   [facility]    Order Specific Question:   Release to patient    Answer:   Immediate   CBC with Differential    Standing Status:   Future    Standing Expiration Date:   01/15/2024   Comprehensive metabolic panel    Standing Status:   Future    Standing Expiration Date:   01/15/2024   Magnesium    Standing Status:   Future    Standing Expiration Date:   01/15/2024      I,Katie Daubenspeck,acting as a scribe  for Doreatha Massed, MD.,have documented all relevant documentation on the behalf of Doreatha Massed, MD,as directed by  Doreatha Massed, MD while in the presence of Doreatha Massed, MD.   I, Doreatha Massed MD, have reviewed the above documentation for accuracy and completeness, and I agree with the above.   Doreatha Massed, MD   5/16/20246:14 PM  CHIEF COMPLAINT:   Diagnosis: metastatic cervical squamous cell carcinoma    Cancer Staging  Cervical cancer [facility]) Staging form: Cervix Uteri, AJCC Version 9 - Clinical stage from 09/19/2022: FIGO Stage IVB (cT4, cN2a, cM1) - Unsigned    Prior Therapy: none  Current Therapy:  Cisplatin + Paclitaxel + Bevacizumab    HISTORY OF PRESENT ILLNESS:   Oncology History Overview Note  PD-L1 CPS 8%   Cervical cancer (HCC)  09/19/2022 Initial Diagnosis   Cervical cancer (HCC)   10/02/2022 -  Chemotherapy   [name]        INTERVAL HISTORY:   Tyreka is a 52 y.o. female presenting to clinic today for follow up of metastatic cervical squamous cell carcinoma. She was last seen by me on 12/24/22.  Since her last visit, she underwent restaging CT C/A/P on 12/29/22 showing: overall interval improvement-- decreasing nodularity in pelvis, edema of uterus peritoneal nodules, size of retroperitoneal lymph nodes, and size of previous lung nodules; no new mass lesion, fluid collection, or lymph node enlargement.  She also presented to the ED last evening with a headache. Head CT showed: no acute intracranial abnormality; extensive paranasal sinus disease; remote right medial orbital wall fracture.  Today, she states that she is doing well overall. Her appetite level is at 75%. Her energy level is at 75%.  PAST MEDICAL HISTORY:   Past Medical History: Past Medical History:  Diagnosis Date   Anemia    Asthma     Bronchitis    Cancer (HCC)    Cervical   CHF (congestive heart failure) (HCC)    COPD (chronic obstructive pulmonary disease) (HCC)    Depression    GERD (gastroesophageal reflux disease)    Headache    Hypertension    Port-A-Cath in place 09/25/2022    Surgical History: Past Surgical History:  Procedure Laterality Date   BREAST SURGERY     CESAREAN SECTION     IR IMAGING GUIDED PORT INSERTION  09/30/2022   LEG SURGERY      Social History: Social History   Socioeconomic History   Marital status: Married    Spouse name: Not on file   Number of children: Not on file   Years of education: Not on file   Highest education level: Not on file  Occupational History   Not on file  Tobacco Use   Smoking status: Every Day  Packs/day: 0.25    Years: 23.00    Additional pack years: 0.00    Total pack years: 5.75    Types: Cigarettes   Smokeless tobacco: Never  Vaping Use   Vaping Use: Never used  Substance and Sexual Activity   Alcohol use: Yes    Comment: occ   Drug use: Yes    Types: Marijuana   Sexual activity: Yes    Birth control/protection: None  Other Topics Concern   Not on file  Social History Narrative   Not on file   Social Determinants of Health   Financial Resource Strain: Low Risk  (09/09/2022)   Overall Financial Resource Strain (CARDIA)    Difficulty of Paying Living Expenses: Not hard at all  Food Insecurity: No Food Insecurity (09/22/2022)   Hunger Vital Sign    Worried About Running Out of Food in the Last Year: Never true    Ran Out of Food in the Last Year: Never true  Transportation Needs: No Transportation Needs (09/22/2022)   PRAPARE - Administrator, Civil Service (Medical): No    Lack of Transportation (Non-Medical): No  Recent Concern: Transportation Needs - Unmet Transportation Needs (09/09/2022)   PRAPARE - Transportation    Lack of Transportation (Medical): Yes    Lack of Transportation (Non-Medical): Yes  Physical  Activity: Insufficiently Active (09/09/2022)   Exercise Vital Sign    Days of Exercise per Week: 2 days    Minutes of Exercise per Session: 20 min  Stress: No Stress Concern Present (09/09/2022)   [employer] of Occupational Health - Occupational Stress Questionnaire    Feeling of Stress : Only a little  Social Connections: Socially Isolated (09/09/2022)   Social Connection and Isolation Panel [NHANES]    Frequency of Communication with Friends and Family: Three times a week    Frequency of Social Gatherings with Friends and Family: More than three times a week    Attends Religious Services: Never    Database administrator or Organizations: No    Attends Banker Meetings: Never    Marital Status: Separated  Intimate Partner Violence: Not At Risk (09/22/2022)   Humiliation, Afraid, Rape, and Kick questionnaire    Fear of Current or Ex-Partner: No    Emotionally Abused: No    Physically Abused: No    Sexually Abused: No    Family History: Family History  Problem Relation Age of Onset   Dermatomyositis Mother    Hypertension Mother    Cancer Mother        lung   Cancer Father        lung   Heart disease Father    Lung cancer Paternal Uncle    Lung cancer Maternal Uncle    Colon cancer Neg Hx    Breast cancer Neg Hx    Ovarian cancer Neg Hx    Endometrial cancer Neg Hx    Pancreatic cancer Neg Hx    Prostate cancer Neg Hx     Current Medications:  Current Outpatient Medications:    albuterol (VENTOLIN HFA) 108 (90 Base) MCG/ACT inhaler, Inhale 2 puffs into the lungs every 6 (six) hours as needed for wheezing or shortness of breath., Disp: 8 g, Rfl: 2   amLODipine (NORVASC) 10 MG tablet, Take 1 tablet (10 mg total) by mouth daily., Disp: 30 tablet, Rfl: 2   amoxicillin-clavulanate (AUGMENTIN) 875-125 MG tablet, Take 1 tablet by mouth 2 (two) times daily., Disp: 14 tablet, Rfl:  0   Atezolizumab (TECENTRIQ IV), Inject into the vein every 21 ( twenty-one) days.,  Disp: , Rfl:    Bevacizumab (AVASTIN IV), Inject into the vein every 21 ( twenty-one) days., Disp: , Rfl:    calcium carbonate (TUMS) 500 MG chewable tablet, Chew 1 tablet (200 mg of elemental calcium total) by mouth 3 (three) times daily with meals., Disp: 60 tablet, Rfl: 3   CISPLATIN IV, Inject into the vein every 21 ( twenty-one) days., Disp: , Rfl:    DULoxetine (CYMBALTA) 60 MG capsule, Take 1 capsule (60 mg total) by mouth daily., Disp: 30 capsule, Rfl: 3   HYDROcodone-acetaminophen (NORCO/VICODIN) 5-325 MG tablet, Take 1 tablet by mouth every 8 (eight) hours as needed for moderate pain., Disp: 60 tablet, Rfl: 0   hydrOXYzine (ATARAX) 10 MG tablet, Take 1 tablet (10 mg total) by mouth 3 (three) times daily as needed., Disp: 30 tablet, Rfl: 0   lidocaine-prilocaine (EMLA) cream, Apply a dime-sized amount to port a cath site and cover with plastic wrap 1 hour prior to infusion appointments, Disp: 30 g, Rfl: 3   LORazepam (ATIVAN) 0.5 MG tablet, Take one tablet by mouth three hours prior to CT scan; take the second tablet by mouth upon arrival to radiology for procedure, Disp: 2 tablet, Rfl: 0   megestrol (MEGACE) 400 MG/10ML suspension, Take 10 mLs (400 mg total) by mouth 2 (two) times daily., Disp: 480 mL, Rfl: 1   nicotine polacrilex (NICORETTE) 4 MG gum, Take 1 each (4 mg total) by mouth as needed for smoking cessation., Disp: 100 tablet, Rfl: 0   ondansetron (ZOFRAN) 8 MG tablet, Take 1 tablet (8 mg total) by mouth every 8 (eight) hours as needed for nausea or vomiting. Start on the third day after cisplatin., Disp: 30 tablet, Rfl: 1   PACLITAXEL IV, Inject into the vein every 21 ( twenty-one) days., Disp: , Rfl:    pantoprazole (PROTONIX) 40 MG tablet, Take 1 tablet (40 mg total) by mouth daily., Disp: 90 tablet, Rfl: 1   predniSONE (DELTASONE) 5 MG tablet, Take 1 tablet (5 mg total) by mouth daily with breakfast., Disp: 30 tablet, Rfl: 2   prochlorperazine (COMPAZINE) 10 MG tablet, Take 1  tablet (10 mg total) by mouth every 6 (six) hours as needed (Nausea or vomiting)., Disp: 30 tablet, Rfl: 1   triamcinolone cream (KENALOG) 0.1 %, Apply 1 Application topically 2 (two) times daily., Disp: 30 g, Rfl: 0   Allergies: Allergies  Allergen Reactions   Carrot [Daucus Carota] Hives   Lisinopril-Hydrochlorothiazide     Oral swelling   Ibuprofen Other (See Comments)    Oral swelling   Tramadol Hives   Erythromycin Hives    REVIEW OF SYSTEMS:   Review of Systems  Constitutional:  Negative for chills, fatigue and fever.  HENT:   Negative for lump/mass, mouth sores, nosebleeds, sore throat and trouble swallowing.   Eyes:  Negative for eye problems.  Respiratory:  Positive for cough and wheezing. Negative for shortness of breath.   Cardiovascular:  Negative for chest pain, leg swelling and palpitations.  Gastrointestinal:  Negative for abdominal pain, constipation, diarrhea, nausea and vomiting.  Genitourinary:  Negative for bladder incontinence, difficulty urinating, dysuria, frequency, hematuria and nocturia.   Musculoskeletal:  Negative for arthralgias, back pain, flank pain, myalgias and neck pain.  Skin:  Negative for itching and rash.  Neurological:  Positive for dizziness, headaches and numbness.  Hematological:  Does not bruise/bleed easily.  Psychiatric/Behavioral:  Negative  for depression, sleep disturbance and suicidal ideas. The patient is not nervous/anxious.   All other systems reviewed and are negative.    VITALS:   Pulse (!) 132, last menstrual period 08/15/2022.  Wt Readings from Last 3 Encounters:  01/15/23 180 lb (81.6 kg)  01/14/23 170 lb (77.1 kg)  12/25/22 178 lb 6.4 oz (80.9 kg)    There is no height or weight on file to calculate BMI.  Performance status (ECOG): 1 - Symptomatic but completely ambulatory  PHYSICAL EXAM:   Physical Exam Vitals and nursing note reviewed. Exam conducted with a chaperone present.  Constitutional:      Appearance:  Normal appearance.  Cardiovascular:     Rate and Rhythm: Normal rate and regular rhythm.     Pulses: Normal pulses.     Heart sounds: Normal heart sounds.  Pulmonary:     Effort: Pulmonary effort is normal.     Breath sounds: Normal breath sounds.  Abdominal:     Palpations: Abdomen is soft. There is no hepatomegaly, splenomegaly or mass.     Tenderness: There is no abdominal tenderness.  Musculoskeletal:     Right lower leg: No edema.     Left lower leg: No edema.  Lymphadenopathy:     Cervical: No cervical adenopathy.     Right cervical: No superficial, deep or posterior cervical adenopathy.    Left cervical: No superficial, deep or posterior cervical adenopathy.     Upper Body:     Right upper body: No supraclavicular or axillary adenopathy.     Left upper body: No supraclavicular or axillary adenopathy.  Neurological:     General: No focal deficit present.     Mental Status: She is alert and oriented to person, place, and time.  Psychiatric:        Mood and Affect: Mood normal.        Behavior: Behavior normal.     LABS:      Latest Ref Rng & Units 01/15/2023    8:08 AM 01/14/2023    6:38 PM 12/24/2022    2:50 PM  CBC  WBC 4.0 - 10.5 K/uL 7.2  7.7  5.8   Hemoglobin 12.0 - 15.0 g/dL 9.9  44.0  34.7   Hematocrit 36.0 - 46.0 % 30.0  33.3  33.6   Platelets 150 - 400 K/uL 126  195  290       Latest Ref Rng & Units 01/15/2023    8:08 AM 01/14/2023    6:38 PM 12/24/2022    2:50 PM  CMP  Glucose 70 - 99 mg/dL 94  91  425   BUN 6 - 20 mg/dL 9  13  9    Creatinine 0.44 - 1.00 mg/dL 9.56  3.87  5.64   Sodium 135 - 145 mmol/L 133  133  135   Potassium 3.5 - 5.1 mmol/L 3.9  4.0  3.9   Chloride 98 - 111 mmol/L 103  103  104   CO2 22 - 32 mmol/L 20  20  22    Calcium 8.9 - 10.3 mg/dL 8.4  8.6  8.8   Total Protein 6.5 - 8.1 g/dL 6.6  6.8  6.9   Total Bilirubin 0.3 - 1.2 mg/dL 0.9  0.9  0.4   Alkaline Phos 38 - 126 U/L 94  77  72   AST 15 - 41 U/L 4,430  776  20   ALT 0 - 44  U/L 1,171  260  13  No results found for: "CEA1", "CEA" / No results found for: "CEA1", "CEA" No results found for: "PSA1" No results found for: "ZOX096" No results found for: "CAN125"  No results found for: "TOTALPROTELP", "ALBUMINELP", "A1GS", "A2GS", "BETS", "BETA2SER", "GAMS", "MSPIKE", "SPEI" Lab Results  Component Value Date   TIBC 412 09/22/2022   FERRITIN 35 09/22/2022   IRONPCTSAT 7 (L) 09/22/2022   No results found for: "LDH"   STUDIES:   CT Head Wo Contrast  Result Date: 01/14/2023 CLINICAL DATA:  Headache, increasing frequency or severity EXAM: CT HEAD WITHOUT CONTRAST TECHNIQUE: Contiguous axial images were obtained from the base of the skull through the vertex without intravenous contrast. RADIATION DOSE REDUCTION: This exam was performed according to the departmental dose-optimization program which includes automated exposure control, adjustment of the mA and/or kV according to patient size and/or use of iterative reconstruction technique. COMPARISON:  08/19/2021 FINDINGS: Brain: Normal anatomic configuration. No abnormal intra or extra-axial mass lesion or fluid collection. No abnormal mass effect or midline shift. No evidence of acute intracranial hemorrhage or infarct. Ventricular size is normal. Cerebellum unremarkable. Vascular: Unremarkable Skull: Intact Sinuses/Orbits: Extensive mucosal thickening within the visualized maxillary sinuses and ethmoid air cells. Remaining paranasal sinuses are clear. Remote right medial orbital wall fracture. Orbits are otherwise unremarkable. Other: Mastoid air cells and middle ear cavities are clear. IMPRESSION: 1. No acute intracranial abnormality. 2. Extensive paranasal sinus disease. 3. Remote right medial orbital wall fracture. Electronically Signed   By: Helyn Numbers M.D.   On: 01/14/2023 19:07   CT CHEST ABDOMEN PELVIS W CONTRAST  Result Date: 12/30/2022 CLINICAL DATA:  Squamous cell carcinoma of the cervix metastatic to  lung and peritoneum and liver. Several month history of vaginal bleeding. * Tracking Code: BO * EXAM: CT CHEST, ABDOMEN, AND PELVIS WITH CONTRAST TECHNIQUE: Multidetector CT imaging of the chest, abdomen and pelvis was performed following the standard protocol during bolus administration of intravenous contrast. RADIATION DOSE REDUCTION: This exam was performed according to the departmental dose-optimization program which includes automated exposure control, adjustment of the mA and/or kV according to patient size and/or use of iterative reconstruction technique. CONTRAST:  OMNIPAQUE IOHEXOL 300 MG/ML  SOLN COMPARISON:  Chest CT 11/10/2022. Bone scan 10/14/2022. Abdomen pelvis CT 08/30/2022. Older exams as well FINDINGS: CT CHEST FINDINGS Cardiovascular: Right upper chest port. Heart is nonenlarged. No pericardial effusion. The thoracic aorta has a normal course and caliber. Minimal atherosclerotic changes. Mediastinum/Nodes: No specific abnormal lymph node enlargement identified in the axillary region, hilum or mediastinum. The prominent left axillary node which has a diameter of 10 mm in short axis, today measures 8 mm in short axis. This has a benign morphology as well. Normal thyroid gland. Slightly patulous thoracic esophagus. Lungs/Pleura: Breathing motion identified. No consolidation, pneumothorax or effusion. There are some centrilobular emphysematous changes identified particularly in the upper lung zones. The subpleural nodule along the lingula laterally on the prior examination that measured 7 mm, today on series 3, image 77 measures 4 mm. The more caudal lingular nodule which measured 10 mm in longitudinal length, today on series 3 image 88 measures 7 mm. Previously middle lobe nodule inferiorly that measured 9 mm in diameter on the prior, today on series 3, image 104 measures 6 mm. No new nodules identified. Musculoskeletal: Scattered degenerative changes of the thoracic spine. Schmorl's node  changes along the upper thoracic spine. Old posterior left-sided rib fractures. CT ABDOMEN PELVIS FINDINGS Hepatobiliary: Focal fat deposition seen in the liver adjacent to the  falciform ligament in segment 4. No other space-occupying liver lesions. Patent portal vein. Gallbladder is nondilated. Pancreas: Unremarkable. No pancreatic ductal dilatation or surrounding inflammatory changes. Spleen: There is a wedge-shaped area of low-density seen in the spleen. Best on series 4, image 91 of the coronal data set. This has not seen previously. This has a configuration of a splenic infarct. Please correlate for any known history. The spleen is nonenlarged. Splenic vein and artery are patent. Adrenals/Urinary Tract: The adrenal glands are preserved. No enhancing renal mass or collecting system dilatation. Partially duplicated left renal collecting system. The ureters have normal course and caliber down to the bladder. Bladder is relatively contracted. Stomach/Bowel: Large bowel is nondilated. Underdistended transverse colon with some questionable wall thickening. There is more significant wall thickening along the rectosigmoid colon. Please correlate with prior treatment. Such as radiation no obstruction or dilatation. Normal appendix extends caudal and medial to the cecum in the right hemipelvis. The stomach and small bowel are nondilated. Vascular/Lymphatic: Normal caliber aorta and IVC with scattered vascular calcifications. Previous retroperitoneal nodes are improving. Example left para-aortic which measured 14 mm in short axis, today on series 2, image 64 short axis dimension of 7 mm. Long axis of 14 mm. Node anterior to the lower IVC which has a diameter of 11 mm in short axis, today short axis of 5 mm on series 2, image 79. No new lymph node enlargement identified. Reproductive: Again septated uterus with lobular contour. No separate adnexal mass. The previous inflammatory stranding in the pelvis and soft tissue  adjacent nodularity is significantly improved. Masslike enlargement of the cervix is improved. Other: Left-sided anterior pelvic peritoneal nodule which measured 2.7 x 2.5 cm, today has some residual tissue on series 2, image 102 measuring 7 by 4 mm. Nodular area in the right anterior hemipelvis which measured 6 mm adjacent to the right side of the uterus is no longer seen. The uterus itself appears smaller and less edematous. No new peritoneal nodules clearly seen. No ascites. Musculoskeletal: Degenerative changes seen of the spine and pelvis. IMPRESSION: Overall interval improvement. Decreasing stranding, soft tissue thickening and nodularity in the pelvis. Peritoneal nodules are decreasing. No ascites. Decreasing size of retroperitoneal lymph nodes. Decreasing size of previous lung nodules. No new mass lesion, fluid collection or lymph node enlargement. There is decreasing edema of the uterus. There is some wall thickening along the rectosigmoid colon. Please correlate for known history including for any history of radiation. No bowel obstruction. New wedge-shaped defect along the spleen. Legrand Rams an evolving splenic infarct. Electronically Signed   By: Karen Kays M.D.   On: 12/30/2022 15:33

## 2023-01-14 NOTE — ED Provider Notes (Signed)
 [place] EMERGENCY DEPARTMENT AT [facility] Provider Note   CSN: [id number] Arrival date & time: 01/14/23  1643     History Chief Complaint  Patient presents with   Headache    Kristin Carson is a 52 y.o. female.  Patient presents emergency department complaints of a headache.  She reports she is currently a chemo patient for cervical cancer.  Is scheduled to receive chemotherapy tomorrow.  Has a past history of migraines but feels that this headache is slightly different than previous headaches.  Has not monitored and followed up closely with oncology.  Due to the photosensitivity Cobo sound sensitivity, vomiting, abdominal pain, chest pain.  Has tried taking over-the-counter medication such as Tylenol without improvement in symptoms.  Is also endorsing some increased nasal congestion and drainage.   Headache      Home Medications Prior to Admission medications   Medication Sig Start Date End Date Taking? Authorizing Provider  albuterol (VENTOLIN HFA) 108 (90 Base) MCG/ACT inhaler Inhale 2 puffs into the lungs every 6 (six) hours as needed for wheezing or shortness of breath. 11/18/22   Billie Lade, MD  amLODipine (NORVASC) 10 MG tablet Take 1 tablet (10 mg total) by mouth daily. 12/08/22   Billie Lade, MD  Atezolizumab (TECENTRIQ IV) Inject into the vein every 21 ( twenty-one) days. 10/02/22   [provider]  Bevacizumab (AVASTIN IV) Inject into the vein every 21 ( twenty-one) days.    [provider]  calcium carbonate (TUMS) 500 MG chewable tablet Chew 1 tablet (200 mg of elemental calcium total) by mouth 3 (three) times daily with meals. 10/10/22   Carver Fila, MD  CISPLATIN IV Inject into the vein every 21 ( twenty-one) days. 10/02/22   [provider]  dexamethasone (DECADRON) 4 MG tablet Take 2 tablets daily x 3 days starting the day after cisplatin chemotherapy. Take with food. 09/25/22   Doreatha Massed, MD  DULoxetine  (CYMBALTA) 60 MG capsule Take 1 capsule (60 mg total) by mouth daily. 12/24/22   Doreatha Massed, MD  HYDROcodone-acetaminophen (NORCO/VICODIN) 5-325 MG tablet Take 1 tablet by mouth every 8 (eight) hours as needed for moderate pain. 12/08/22   Doreatha Massed, MD  hydrOXYzine (ATARAX) 10 MG tablet Take 1 tablet (10 mg total) by mouth 3 (three) times daily as needed. 12/03/22   Doreatha Massed, MD  lidocaine-prilocaine (EMLA) cream Apply a dime-sized amount to port a cath site and cover with plastic wrap 1 hour prior to infusion appointments 09/25/22   Doreatha Massed, MD  LORazepam (ATIVAN) 0.5 MG tablet Take one tablet by mouth three hours prior to CT scan; take the second tablet by mouth upon arrival to radiology for procedure 10/23/22   Doreatha Massed, MD  megestrol (MEGACE) 400 MG/10ML suspension Take 10 mLs (400 mg total) by mouth 2 (two) times daily. 01/01/23   Doreatha Massed, MD  nicotine polacrilex (NICORETTE) 4 MG gum Take 1 each (4 mg total) by mouth as needed for smoking cessation. 11/18/22   Billie Lade, MD  ondansetron (ZOFRAN) 8 MG tablet Take 1 tablet (8 mg total) by mouth every 8 (eight) hours as needed for nausea or vomiting. Start on the third day after cisplatin. 09/25/22   Doreatha Massed, MD  PACLITAXEL IV Inject into the vein every 21 ( twenty-one) days.    [provider]  pantoprazole (PROTONIX) 40 MG tablet Take 1 tablet (40 mg total) by mouth daily. 10/13/22   Rojelio Brenner  M, PA-C  predniSONE (DELTASONE) 5 MG tablet Take 1 tablet (5 mg total) by mouth daily with breakfast. 12/05/22   Doreatha Massed, MD  prochlorperazine (COMPAZINE) 10 MG tablet Take 1 tablet (10 mg total) by mouth every 6 (six) hours as needed (Nausea or vomiting). 09/25/22   Doreatha Massed, MD  triamcinolone cream (KENALOG) 0.1 % Apply 1 Application topically 2 (two) times daily. 11/18/22   Billie Lade, MD  famotidine (PEPCID) 20 MG tablet Take 1  tablet (20 mg total) by mouth 2 (two) times daily. Patient not taking: Reported on 01/12/2018 12/18/17 02/02/20  Devoria Albe, MD  hydrochlorothiazide (HYDRODIURIL) 25 MG tablet Take 1 tablet (25 mg total) by mouth daily. 06/17/19 02/02/20  Devoria Albe, MD      Allergies    Carrot [daucus carota], Lisinopril-hydrochlorothiazide, Ibuprofen, Tramadol, and Erythromycin    Review of Systems   Review of Systems  Neurological:  Positive for headaches.  All other systems reviewed and are negative.   Physical Exam Updated Vital Signs BP (!) 152/94 (BP Location: Right Arm)   Pulse 96   Temp 99 F (37.2 C)   Resp 18   Ht 5\' 6"  (1.676 m)   Wt 77.1 kg   LMP 08/15/2022 (Approximate)   SpO2 100%   BMI 27.44 kg/m  Physical Exam Vitals and nursing note reviewed.  Constitutional:      General: She is not in acute distress.    Appearance: She is well-developed.  HENT:     Head: Normocephalic and atraumatic.  Eyes:     Conjunctiva/sclera: Conjunctivae normal.  Cardiovascular:     Rate and Rhythm: Normal rate and regular rhythm.     Heart sounds: No murmur heard. Pulmonary:     Effort: Pulmonary effort is normal. No respiratory distress.     Breath sounds: Normal breath sounds.  Abdominal:     Palpations: Abdomen is soft.     Tenderness: There is no abdominal tenderness.  Musculoskeletal:        General: No swelling.     Cervical back: Neck supple.  Skin:    General: Skin is warm and dry.     Capillary Refill: Capillary refill takes less than 2 seconds.  Neurological:     Mental Status: She is alert.     Cranial Nerves: No cranial nerve deficit or facial asymmetry.  Psychiatric:        Mood and Affect: Mood normal.     ED Results / Procedures / Treatments   Labs (all labs ordered are listed, but only abnormal results are displayed) Labs Reviewed  COMPREHENSIVE METABOLIC PANEL - Abnormal; Notable for the following components:      Result Value   Sodium 133 (*)    CO2 20 (*)     Calcium 8.6 (*)    Albumin 3.1 (*)    AST 776 (*)    ALT 260 (*)    All other components within normal limits  CBC WITH DIFFERENTIAL/PLATELET - Abnormal; Notable for the following components:   RBC 3.44 (*)    Hemoglobin 10.7 (*)    HCT 33.3 (*)    RDW 22.6 (*)    All other components within normal limits  RESP PANEL BY RT-PCR (RSV, FLU A&B, COVID)  RVPGX2    EKG None  Radiology CT Head Wo Contrast  Result Date: 01/14/2023 CLINICAL DATA:  Headache, increasing frequency or severity EXAM: CT HEAD WITHOUT CONTRAST TECHNIQUE: Contiguous axial images were obtained from the base of  the skull through the vertex without intravenous contrast. RADIATION DOSE REDUCTION: This exam was performed according to the departmental dose-optimization program which includes automated exposure control, adjustment of the mA and/or kV according to patient size and/or use of iterative reconstruction technique. COMPARISON:  08/19/2021 FINDINGS: Brain: Normal anatomic configuration. No abnormal intra or extra-axial mass lesion or fluid collection. No abnormal mass effect or midline shift. No evidence of acute intracranial hemorrhage or infarct. Ventricular size is normal. Cerebellum unremarkable. Vascular: Unremarkable Skull: Intact Sinuses/Orbits: Extensive mucosal thickening within the visualized maxillary sinuses and ethmoid air cells. Remaining paranasal sinuses are clear. Remote right medial orbital wall fracture. Orbits are otherwise unremarkable. Other: Mastoid air cells and middle ear cavities are clear. IMPRESSION: 1. No acute intracranial abnormality. 2. Extensive paranasal sinus disease. 3. Remote right medial orbital wall fracture. Electronically Signed   By: Helyn Numbers M.D.   On: 01/14/2023 19:07    Procedures Procedures   Medications Ordered in ED Medications  sodium chloride 0.9 % bolus 1,000 mL (0 mLs Intravenous Stopped 01/14/23 2221)  diphenhydrAMINE (BENADRYL) injection 12.5 mg (12.5 mg  Intravenous Given 01/14/23 2047)  prochlorperazine (COMPAZINE) injection 10 mg (10 mg Intravenous Given 01/14/23 2057)  heparin lock flush 100 unit/mL (500 Units Intracatheter Given 01/14/23 2224)    ED Course/ Medical Decision Making/ A&P                           Medical Decision Making Amount and/or Complexity of Data Reviewed Labs: ordered. Radiology: ordered.  Risk Prescription drug management.   This patient presents to the ED for concern of headache.  Differential diagnosis includes brain mets, migraine, tension headache, viral URI   Lab Tests:  I Ordered, and personally interpreted labs.  The pertinent results include: CBC largely unremarkable, CMP with slight dehydration and elevation in liver enzymes with AST at 776 and ALT at 260.   Imaging Studies ordered:  I ordered imaging studies including CT head I independently visualized and interpreted imaging which showed no acute intracranial abnormalities I agree with the radiologist interpretation   Medicines ordered and prescription drug management:  I ordered medication including fluids, Compazine, Benadryl for migraine Reevaluation of the patient after these medicines showed that the patient improved I have reviewed the patients home medicines and have made adjustments as needed   Problem List / ED Course:  Patient presents emergency department complaints of a headache.  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  Labs initiated with findings of elevated liver enzymes.  CT head also ordered which did not show any acute findings such as metastatic spread, SAH, ischemia.  Migraine cocktail administered with improvement in symptoms.  Did have concern about Compazine given metabolism through liver but discussed with pharmacist who advised that one-time dose would not be detrimental to patient's condition. Reassessed patient after  administration of migraine cocktail with resolution of migraine. Encouraged patient to follow up with oncology regarding elevated liver enzymes. Could be secondary to high dose acetaminophen use for several days trying to treat headache. Patient is agreeable with treatment plan and verbalized understanding all return precautions. All questions answered prior to patient discharge.  Final Clinical Impression(s) / ED Diagnoses Final diagnoses:  Other migraine without status migrainosus, not intractable    Rx / DC Orders ED Discharge Orders     None         Marquis Diles, Barnetta Chapel,  PA-C 01/15/23 0032    Rondel Baton, MD 01/15/23 [phone]

## 2023-01-15 ENCOUNTER — Telehealth: Payer: Self-pay

## 2023-01-15 ENCOUNTER — Encounter: Payer: Self-pay | Admitting: Hematology

## 2023-01-15 ENCOUNTER — Inpatient Hospital Stay: Payer: Medicaid Other

## 2023-01-15 ENCOUNTER — Inpatient Hospital Stay (HOSPITAL_BASED_OUTPATIENT_CLINIC_OR_DEPARTMENT_OTHER): Payer: Medicaid Other | Admitting: Hematology

## 2023-01-15 VITALS — HR 132

## 2023-01-15 VITALS — BP 114/80 | HR 105 | Temp 98.8°F | Resp 20 | Wt 180.0 lb

## 2023-01-15 DIAGNOSIS — C538 Malignant neoplasm of overlapping sites of cervix uteri: Secondary | ICD-10-CM

## 2023-01-15 DIAGNOSIS — F17210 Nicotine dependence, cigarettes, uncomplicated: Secondary | ICD-10-CM | POA: Diagnosis not present

## 2023-01-15 DIAGNOSIS — D500 Iron deficiency anemia secondary to blood loss (chronic): Secondary | ICD-10-CM

## 2023-01-15 DIAGNOSIS — R87810 Cervical high risk human papillomavirus (HPV) DNA test positive: Secondary | ICD-10-CM | POA: Diagnosis not present

## 2023-01-15 DIAGNOSIS — Z95828 Presence of other vascular implants and grafts: Secondary | ICD-10-CM

## 2023-01-15 DIAGNOSIS — G629 Polyneuropathy, unspecified: Secondary | ICD-10-CM | POA: Diagnosis not present

## 2023-01-15 DIAGNOSIS — C7801 Secondary malignant neoplasm of right lung: Secondary | ICD-10-CM | POA: Diagnosis not present

## 2023-01-15 DIAGNOSIS — D649 Anemia, unspecified: Secondary | ICD-10-CM | POA: Diagnosis not present

## 2023-01-15 DIAGNOSIS — Z801 Family history of malignant neoplasm of trachea, bronchus and lung: Secondary | ICD-10-CM | POA: Diagnosis not present

## 2023-01-15 DIAGNOSIS — C787 Secondary malignant neoplasm of liver and intrahepatic bile duct: Secondary | ICD-10-CM | POA: Diagnosis not present

## 2023-01-15 DIAGNOSIS — R7989 Other specified abnormal findings of blood chemistry: Secondary | ICD-10-CM

## 2023-01-15 DIAGNOSIS — C786 Secondary malignant neoplasm of retroperitoneum and peritoneum: Secondary | ICD-10-CM | POA: Diagnosis not present

## 2023-01-15 DIAGNOSIS — R0981 Nasal congestion: Secondary | ICD-10-CM | POA: Diagnosis not present

## 2023-01-15 DIAGNOSIS — C7802 Secondary malignant neoplasm of left lung: Secondary | ICD-10-CM | POA: Diagnosis not present

## 2023-01-15 DIAGNOSIS — C539 Malignant neoplasm of cervix uteri, unspecified: Secondary | ICD-10-CM | POA: Diagnosis not present

## 2023-01-15 LAB — CBC WITH DIFFERENTIAL/PLATELET
Abs Immature Granulocytes: 0.06 10*3/uL (ref 0.00–0.07)
Basophils Absolute: 0 10*3/uL (ref 0.0–0.1)
Basophils Relative: 0 %
Eosinophils Absolute: 0 10*3/uL (ref 0.0–0.5)
Eosinophils Relative: 0 %
HCT: 30 % — ABNORMAL LOW (ref 36.0–46.0)
Hemoglobin: 9.9 g/dL — ABNORMAL LOW (ref 12.0–15.0)
Immature Granulocytes: 1 %
Lymphocytes Relative: 24 %
Lymphs Abs: 1.8 10*3/uL (ref 0.7–4.0)
MCH: 31.4 pg (ref 26.0–34.0)
MCHC: 33 g/dL (ref 30.0–36.0)
MCV: 95.2 fL (ref 80.0–100.0)
Monocytes Absolute: 0.5 10*3/uL (ref 0.1–1.0)
Monocytes Relative: 6 %
Neutro Abs: 4.9 10*3/uL (ref 1.7–7.7)
Neutrophils Relative %: 69 %
Platelets: 126 10*3/uL — ABNORMAL LOW (ref 150–400)
RBC: 3.15 MIL/uL — ABNORMAL LOW (ref 3.87–5.11)
RDW: 22.2 % — ABNORMAL HIGH (ref 11.5–15.5)
WBC: 7.2 10*3/uL (ref 4.0–10.5)
nRBC: 0 % (ref 0.0–0.2)

## 2023-01-15 LAB — COMPREHENSIVE METABOLIC PANEL
ALT: 1171 U/L — ABNORMAL HIGH (ref 0–44)
AST: 4430 U/L — ABNORMAL HIGH (ref 15–41)
Albumin: 3 g/dL — ABNORMAL LOW (ref 3.5–5.0)
Alkaline Phosphatase: 94 U/L (ref 38–126)
Anion gap: 10 (ref 5–15)
BUN: 9 mg/dL (ref 6–20)
CO2: 20 mmol/L — ABNORMAL LOW (ref 22–32)
Calcium: 8.4 mg/dL — ABNORMAL LOW (ref 8.9–10.3)
Chloride: 103 mmol/L (ref 98–111)
Creatinine, Ser: 0.95 mg/dL (ref 0.44–1.00)
GFR, Estimated: >60 mL/min (ref >60)
Glucose, Bld: 94 mg/dL (ref 70–99)
Potassium: 3.9 mmol/L (ref 3.5–5.1)
Sodium: 133 mmol/L — ABNORMAL LOW (ref 135–145)
Total Bilirubin: 0.9 mg/dL (ref 0.3–1.2)
Total Protein: 6.6 g/dL (ref 6.5–8.1)

## 2023-01-15 LAB — MAGNESIUM: Magnesium: 1.6 mg/dL — ABNORMAL LOW (ref 1.7–2.4)

## 2023-01-15 MED ORDER — SODIUM CHLORIDE 0.9% FLUSH
10.0000 mL | Freq: Once | INTRAVENOUS | Status: AC
  Administered 2023-01-15: 10 mL via INTRAVENOUS

## 2023-01-15 MED ORDER — AMOXICILLIN-POT CLAVULANATE 875-125 MG PO TABS: 1.0000 | ORAL_TABLET | Freq: Two times a day (BID) | ORAL | 0 refills | Status: DC

## 2023-01-15 MED ORDER — HEPARIN SOD (PORK) LOCK FLUSH 100 UNIT/ML IV SOLN
500.0000 [IU] | Freq: Once | INTRAVENOUS | Status: AC | PRN
  Administered 2023-01-15: 500 [IU]

## 2023-01-15 MED ORDER — MAGNESIUM SULFATE 2 GM/50ML IV SOLN
2.0000 g | Freq: Once | INTRAVENOUS | Status: AC
  Administered 2023-01-15: 2 g via INTRAVENOUS
  Filled 2023-01-15: qty 50

## 2023-01-15 MED ORDER — POTASSIUM CHLORIDE IN NACL 20-0.9 MEQ/L-% IV SOLN
Freq: Once | INTRAVENOUS | Status: AC
  Filled 2023-01-15: qty 1000

## 2023-01-15 MED ORDER — SODIUM CHLORIDE 0.9% FLUSH
10.0000 mL | Freq: Once | INTRAVENOUS | Status: AC | PRN
  Administered 2023-01-15: 10 mL

## 2023-01-15 NOTE — Patient Instructions (Signed)
[facility]  Discharge Instructions: Thank you for choosing [facility] to provide your oncology and hematology care.  If you have a lab appointment with the Cancer Center - please note that after April 8th, 2024, all labs will be drawn in the cancer center.  You do not have to check in or register with the main entrance as you have in the past but will complete your check-in in the cancer center.  Wear comfortable clothing and clothing appropriate for easy access to any Portacath or PICC line.   We strive to give you quality time with your provider. You may need to reschedule your appointment if you arrive late (15 or more minutes).  Arriving late affects you and other patients whose appointments are after yours.  Also, if you miss three or more appointments without notifying the office, you may be dismissed from the clinic at the provider's discretion.      For prescription refill requests, have your pharmacy contact our office and allow 72 hours for refills to be completed.    Today you received the following Potassium Chloride 20 mEq and Magnesium.    To help prevent nausea and vomiting after your treatment, we encourage you to take your nausea medication as directed.  BELOW ARE SYMPTOMS THAT SHOULD BE REPORTED IMMEDIATELY: *FEVER GREATER THAN 100.4 F (38 C) OR HIGHER *CHILLS OR SWEATING *NAUSEA AND VOMITING THAT IS NOT CONTROLLED WITH YOUR NAUSEA MEDICATION *UNUSUAL SHORTNESS OF BREATH *UNUSUAL BRUISING OR BLEEDING *URINARY PROBLEMS (pain or burning when urinating, or frequent urination) *BOWEL PROBLEMS (unusual diarrhea, constipation, pain near the anus) TENDERNESS IN MOUTH AND THROAT WITH OR WITHOUT PRESENCE OF ULCERS (sore throat, sores in mouth, or a toothache) UNUSUAL RASH, SWELLING OR PAIN  UNUSUAL VAGINAL DISCHARGE OR ITCHING   Items with * indicate a potential emergency and should be followed up as soon as possible or go to the Emergency  Department if any problems should occur.  Please show the CHEMOTHERAPY ALERT CARD or IMMUNOTHERAPY ALERT CARD at check-in to the Emergency Department and triage nurse.  Should you have questions after your visit or need to cancel or reschedule your appointment, please contact [facility] CENTER AT [facility] [phone]  and follow the prompts.  Office hours are 8:00 a.m. to 4:30 p.m. Monday - Friday. Please note that voicemails left after 4:00 p.m. may not be returned until the following business day.  We are closed weekends and major holidays. You have access to a nurse at all times for urgent questions. Please call the main number to the clinic [phone] and follow the prompts.  For any non-urgent questions, you may also contact your provider using MyChart. We now offer e-Visits for anyone 62 and older to request care online for non-urgent symptoms. For details visit [email or web address].   Also download the MyChart app! Go to the app store, search "MyChart", open the app, select [place], and log in with your MyChart username and password.

## 2023-01-15 NOTE — Patient Instructions (Addendum)
[facility] at [facility] Discharge Instructions   You were seen and examined today by Dr. Ellin Saba.  He reviewed the results of your CT scan which was stable.   He reviewed the results of your lab work which are mostly normal/stable. Your liver enzymes were very high yesterday when tested in the ED. Stop taking Tylenol as this could be the result of overuse of Tylenol.  We will hold your treatment today. We will just give you IV fluids with electrolytes today.   Return as scheduled.    Thank you for choosing  Cancer Center at [facility] to provide your oncology and hematology care.  To afford each patient quality time with our provider, please arrive at least 15 minutes before your scheduled appointment time.   If you have a lab appointment with the Cancer Center please come in thru the Main Entrance and check in at the main information desk.  You need to re-schedule your appointment should you arrive 10 or more minutes late.  We strive to give you quality time with our providers, and arriving late affects you and other patients whose appointments are after yours.  Also, if you no show three or more times for appointments you may be dismissed from the clinic at the providers discretion.     Again, thank you for choosing [facility].  Our hope is that these requests will decrease the amount of time that you wait before being seen by our physicians.       _____________________________________________________________  Should you have questions after your visit to [facility], please contact our office at [phone] and follow the prompts.  Our office hours are 8:00 a.m. and 4:30 p.m. Monday - Friday.  Please note that voicemails left after 4:00 p.m. may not be returned until the following business day.  We are closed weekends and major holidays.  You do have access to a nurse 24-7, just call the main number to the clinic  [phone] and do not press any options, hold on the line and a nurse will answer the phone.    For prescription refill requests, have your pharmacy contact our office and allow 72 hours.    Due to Covid, you will need to wear a mask upon entering the hospital. If you do not have a mask, a mask will be given to you at the Main Entrance upon arrival. For doctor visits, patients may have 1 support person age 9 or older with them. For treatment visits, patients can not have anyone with them due to social distancing guidelines and our immunocompromised population.

## 2023-01-15 NOTE — Progress Notes (Signed)
Patient presents today for chemotherapy/immunotherapy infusion of Keytruda, Vegzelma, Taxol, and Cisplatin. Patient c/o headache and chest congestion in which she was seen in the ED for yesterday and had head CT and given medication. Patient reports only slight improvement in headache, Dr Ellin Saba aware. Vital signs are stable with exception of HR 132. Labs reviewed by Dr. Ellin Saba during the office visit, magnesium 1.6, AST 4,430, and ALT 1,170. Patient's treatment held today, patient to receive house fluids with magnesium and a liver US order placed. Patient aware and verbalizes understanding.   Patient tolerated treatment well with no complaints voiced.  Patient left ambulatory in stable condition.  Vital signs improved and stable at discharge.Patient instructed to stop at front desk for further instruction on Liver US, verbalized understanding. Follow up as scheduled.

## 2023-01-15 NOTE — Progress Notes (Signed)
Patients port flushed without difficulty.  Good blood return noted with no bruising or swelling noted at site.  Stable during access and blood draw.  Patient to remain accessed for treatment. 

## 2023-01-15 NOTE — Transitions of Care (Post Inpatient/ED Visit) (Signed)
   01/15/2023  Name: Kristin Carson MRN: [id number] DOB: [birth year]  Today's TOC FU Call Status: Today's TOC FU Call Status:: Unsuccessul Call (1st Attempt) Unsuccessful Call (1st Attempt) Date: 01/15/23  Attempted to reach the patient regarding the most recent Inpatient/ED visit.  Follow Up Plan: Additional outreach attempts will be made to reach the patient to complete the Transitions of Care (Post Inpatient/ED visit) call.   Signature   Agnes Lawrence, CMA (AAMA)  [facility] [phone]

## 2023-01-19 ENCOUNTER — Ambulatory Visit (HOSPITAL_COMMUNITY): Admission: RE | Admit: 2023-01-19 | Payer: Medicaid Other | Source: Ambulatory Visit

## 2023-01-20 ENCOUNTER — Other Ambulatory Visit: Payer: Self-pay | Admitting: *Deleted

## 2023-01-20 DIAGNOSIS — C538 Malignant neoplasm of overlapping sites of cervix uteri: Secondary | ICD-10-CM

## 2023-01-22 ENCOUNTER — Inpatient Hospital Stay: Payer: Medicaid Other

## 2023-01-22 ENCOUNTER — Inpatient Hospital Stay: Payer: Medicaid Other | Admitting: Hematology

## 2023-01-29 NOTE — Transitions of Care (Post Inpatient/ED Visit) (Signed)
   01/29/2023  Name: Kristin Carson MRN: [id number] DOB: 01-30-1971  Today's TOC FU Call Status: Today's TOC FU Call Status:: Successful TOC FU Call Competed Unsuccessful Call (1st Attempt) Date: 01/15/23 Unsuccessful Call (2nd Attempt) Date: 01/29/23 [facility] FU Call Complete Date: 01/29/23  Attempted to reach the patient regarding the most recent Inpatient/ED visit.  Follow Up Plan: No further outreach attempts will be made at this time. We have been unable to contact the patient.  Signature Agnes Lawrence, CMA (AAMA)  [facility] [phone]

## 2023-01-30 ENCOUNTER — Other Ambulatory Visit: Payer: Self-pay

## 2023-01-31 DIAGNOSIS — Z419 Encounter for procedure for purposes other than remedying health state, unspecified: Secondary | ICD-10-CM | POA: Diagnosis not present

## 2023-02-06 ENCOUNTER — Inpatient Hospital Stay: Payer: Medicaid Other | Attending: Hematology

## 2023-02-06 ENCOUNTER — Ambulatory Visit (HOSPITAL_COMMUNITY): Admission: RE | Admit: 2023-02-06 | Payer: Medicaid Other | Source: Ambulatory Visit

## 2023-02-09 ENCOUNTER — Encounter: Payer: Medicaid Other | Admitting: Dietician

## 2023-02-12 ENCOUNTER — Inpatient Hospital Stay: Payer: Medicaid Other | Admitting: Hematology

## 2023-02-12 ENCOUNTER — Inpatient Hospital Stay: Payer: Medicaid Other | Admitting: Gynecologic Oncology

## 2023-02-12 NOTE — Progress Notes (Unsigned)
Patient did not show for this visit.

## 2023-02-17 ENCOUNTER — Other Ambulatory Visit: Payer: Self-pay

## 2023-02-18 ENCOUNTER — Ambulatory Visit (HOSPITAL_COMMUNITY)
Admission: RE | Admit: 2023-02-18 | Discharge: 2023-02-18 | Disposition: A | Discharge status: Home / Self Care | Payer: Medicaid Other | Source: Ambulatory Visit | Attending: Hematology | Admitting: Hematology

## 2023-02-18 DIAGNOSIS — R7989 Other specified abnormal findings of blood chemistry: Secondary | ICD-10-CM | POA: Insufficient documentation

## 2023-02-18 DIAGNOSIS — R945 Abnormal results of liver function studies: Secondary | ICD-10-CM | POA: Diagnosis not present

## 2023-02-24 ENCOUNTER — Inpatient Hospital Stay: Payer: Medicaid Other

## 2023-03-02 DIAGNOSIS — Z419 Encounter for procedure for purposes other than remedying health state, unspecified: Secondary | ICD-10-CM | POA: Diagnosis not present

## 2023-03-03 ENCOUNTER — Inpatient Hospital Stay: Payer: Medicaid Other | Attending: Hematology | Admitting: Hematology

## 2023-03-03 ENCOUNTER — Inpatient Hospital Stay: Payer: Medicaid Other

## 2023-03-03 ENCOUNTER — Encounter: Payer: Self-pay | Admitting: Hematology

## 2023-03-03 VITALS — BP 143/87 | HR 117 | Temp 98.0°F | Resp 20 | Wt 174.2 lb

## 2023-03-03 DIAGNOSIS — C7802 Secondary malignant neoplasm of left lung: Secondary | ICD-10-CM | POA: Insufficient documentation

## 2023-03-03 DIAGNOSIS — R87810 Cervical high risk human papillomavirus (HPV) DNA test positive: Secondary | ICD-10-CM | POA: Diagnosis not present

## 2023-03-03 DIAGNOSIS — G622 Polyneuropathy due to other toxic agents: Secondary | ICD-10-CM

## 2023-03-03 DIAGNOSIS — Z79633 Long term (current) use of mitotic inhibitor: Secondary | ICD-10-CM | POA: Diagnosis not present

## 2023-03-03 DIAGNOSIS — G620 Drug-induced polyneuropathy: Secondary | ICD-10-CM | POA: Insufficient documentation

## 2023-03-03 DIAGNOSIS — Z7689 Persons encountering health services in other specified circumstances: Secondary | ICD-10-CM | POA: Diagnosis not present

## 2023-03-03 DIAGNOSIS — C539 Malignant neoplasm of cervix uteri, unspecified: Secondary | ICD-10-CM | POA: Diagnosis not present

## 2023-03-03 DIAGNOSIS — C7801 Secondary malignant neoplasm of right lung: Secondary | ICD-10-CM | POA: Insufficient documentation

## 2023-03-03 DIAGNOSIS — Z5111 Encounter for antineoplastic chemotherapy: Secondary | ICD-10-CM | POA: Diagnosis present

## 2023-03-03 DIAGNOSIS — Z79620 Long term (current) use of immunosuppressive biologic: Secondary | ICD-10-CM | POA: Diagnosis not present

## 2023-03-03 DIAGNOSIS — D649 Anemia, unspecified: Secondary | ICD-10-CM | POA: Diagnosis not present

## 2023-03-03 DIAGNOSIS — I10 Essential (primary) hypertension: Secondary | ICD-10-CM | POA: Insufficient documentation

## 2023-03-03 DIAGNOSIS — C787 Secondary malignant neoplasm of liver and intrahepatic bile duct: Secondary | ICD-10-CM | POA: Diagnosis not present

## 2023-03-03 DIAGNOSIS — Z801 Family history of malignant neoplasm of trachea, bronchus and lung: Secondary | ICD-10-CM | POA: Diagnosis not present

## 2023-03-03 DIAGNOSIS — R634 Abnormal weight loss: Secondary | ICD-10-CM | POA: Diagnosis not present

## 2023-03-03 DIAGNOSIS — C538 Malignant neoplasm of overlapping sites of cervix uteri: Secondary | ICD-10-CM

## 2023-03-03 DIAGNOSIS — Z79630 Long term (current) use of alkylating agent: Secondary | ICD-10-CM | POA: Diagnosis not present

## 2023-03-03 DIAGNOSIS — F17210 Nicotine dependence, cigarettes, uncomplicated: Secondary | ICD-10-CM | POA: Insufficient documentation

## 2023-03-03 DIAGNOSIS — C786 Secondary malignant neoplasm of retroperitoneum and peritoneum: Secondary | ICD-10-CM | POA: Diagnosis not present

## 2023-03-03 DIAGNOSIS — Z5112 Encounter for antineoplastic immunotherapy: Secondary | ICD-10-CM | POA: Diagnosis not present

## 2023-03-03 LAB — HEPATIC FUNCTION PANEL
ALT: 14 U/L (ref 0–44)
AST: 22 U/L (ref 15–41)
Albumin: 3.6 g/dL (ref 3.5–5.0)
Alkaline Phosphatase: 48 U/L (ref 38–126)
Bilirubin, Direct: <0.1 mg/dL (ref 0.0–0.2)
Total Bilirubin: 0.5 mg/dL (ref 0.3–1.2)
Total Protein: 7.6 g/dL (ref 6.5–8.1)

## 2023-03-03 MED ORDER — GABAPENTIN 300 MG PO CAPS
300.0000 mg | ORAL_CAPSULE | Freq: Three times a day (TID) | ORAL | 3 refills | Status: DC
Start: 2023-03-03 — End: 2023-03-30

## 2023-03-03 NOTE — Progress Notes (Signed)
[facility] [address], [place] [address]    Clinic Day:  03/03/2023  Referring physician: Billie Lade, MD  Patient Care Team: Billie Lade, MD as PCP - General (Internal Medicine) Doreatha Massed, MD as Medical Oncologist (Medical Oncology) Therese Sarah, RN as Oncology Nurse Navigator (Medical Oncology)   ASSESSMENT & PLAN:   Assessment: 1.  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 - CTAP (08/30/2022): Irregularity of the endometrium extending through the cervix with foci of gas in the cervix.  Multifocal omental and peritoneal metastasis.  Metastatic left periaortic, aortocaval, left external iliac and right common iliac lymph nodes.  Multiple bilateral lung nodules at bases.  12 mm hypodensity in the liver near the falciform ligament. - Endometrial biopsy (09/09/2022): Invasive moderately to poorly differentiated squamous cell carcinoma with abundant necrosis.  Cervix biopsy: Invasive and in situ moderate to poorly differentiated squamous cell carcinoma.  High risk HPV positive. - NGS: PD-L1 (22 C3): CPS 2, HER2 IHC negative.  PIK3CA exon 10 pathogenic variant.  MS-stable.  TMB-low.  FANCM and SDHA pathogenic variants. - PD-L1 CPS: 8% - Cycle 1 of cisplatin, paclitaxel, bevacizumab on 10/02/2022, pembrolizumab added with cycle 2 on 10/23/2022 - Bone scan (10/14/2022): No metastatic disease   2.  Social/family history: - She is married and lives at home with her husband and roommate.  She has never worked.  She is current active smoker, half pack per day, for the last 28 years.  She occasionally smokes marijuana. - Mother died of lung cancer.  Father had head and neck cancer.  Maternal grandmother had cancer.  Paternal uncle died of cancer.  Maternal uncle also had cancer.    Plan: 1.   Metastatic cervical cancer to the lungs, peritoneum and liver: - She received 5 cycles of chemoimmunotherapy with Keytruda completed on 12/25/2022. - She had elevated LFTs on 01/15/2023 and the chemotherapy was held.  An ultrasound of the abdomen on 02/18/2023 showed hepatic steatosis. - She missed her appointment as one of her family members died and she had transportation issues. - Today she is willing to go back on chemotherapy. - I have reviewed her LFTs today which are completely normal.  Will schedule for next cycle of treatment next week. - Follow-up in 3 weeks after next cycle.   2.  Normocytic anemia: - She has received intermittent Feraheme in the past.  Last hemoglobin is 9.9.   3.  Peripheral neuropathy: - She reports neuropathic pains in the feet, combination of burning pain/toothache/numbness.  She could not tolerate Cymbalta because of nausea. - Will start her on gabapentin 300 mg 3 times daily.   4.  Hypertension: - Continue amlodipine 10 mg daily.  Blood pressure today is 140/87.   5.  Weight loss: - She lost about 4 pounds since last visit.  Will closely monitor.  She reports appetite is good.      Orders Placed This Encounter  Procedures   Hepatic function panel    Standing Status:   Future    Number of Occurrences:   1    Standing Expiration Date:   03/02/2024    Order Specific Question:   Release to patient    Answer:   Immediate [1]    Order Specific Question:   Remote health to draw?  Answer:   No   CBC with Differential    Standing Status:   Future    Standing Expiration Date:   03/02/2024   Comprehensive metabolic panel    Standing Status:   Future    Standing Expiration Date:   03/02/2024   Magnesium    Standing Status:   Future    Standing Expiration Date:   03/02/2024      I,Katie Daubenspeck,acting as a scribe for Doreatha Massed, MD.,have documented all relevant documentation on the behalf of Doreatha Massed, MD,as directed by  Doreatha Massed, MD while in the presence of Doreatha Massed, MD.   I, Doreatha Massed MD, have reviewed the above documentation for accuracy and completeness, and I agree with the above.   Doreatha Massed, MD   7/2/20246:10 PM  CHIEF COMPLAINT:   Diagnosis: metastatic cervical squamous cell carcinoma    Cancer Staging  Cervical cancer [facility]) Staging form: Cervix Uteri, AJCC Version 9 - Clinical stage from 09/19/2022: FIGO Stage IVB (cT4, cN2a, cM1) - Unsigned    Prior Therapy: none  Current Therapy:  Cisplatin + Paclitaxel + Bevacizumab, Keytruda   HISTORY OF PRESENT ILLNESS:   Oncology History Overview Note  PD-L1 CPS 8%   Cervical cancer (HCC)  09/19/2022 Initial Diagnosis   Cervical cancer (HCC)   10/02/2022 -  Chemotherapy   [name]        INTERVAL HISTORY:   Celecia is a 52 y.o. female presenting to clinic today for follow up of metastatic cervical squamous cell carcinoma. She was last seen by me on 01/15/23.  Since her last visit, she underwent RUQ abdomen US on 02/18/23 for elevated LFT's showing: increased hepatic parenchymal echogenicity suggestive of steatosis.  Today, she states that she is doing well overall. Her appetite level is at 100%. Her energy level is at 75%.  PAST MEDICAL HISTORY:   Past Medical History: Past Medical History:  Diagnosis Date   Anemia    Asthma    Bronchitis    Cancer (HCC)    Cervical   CHF (congestive heart failure) (HCC)    COPD (chronic obstructive pulmonary disease) (HCC)    Depression    GERD (gastroesophageal reflux disease)    Headache    Hypertension    Port-A-Cath in place 09/25/2022    Surgical History: Past Surgical History:  Procedure Laterality Date   BREAST SURGERY     CESAREAN SECTION     IR IMAGING GUIDED PORT INSERTION  09/30/2022   LEG SURGERY      Social History: Social History    Socioeconomic History   Marital status: Married    Spouse name: Not on file   Number of children: Not on file   Years of education: Not on file   Highest education level: Not on file  Occupational History   Not on file  Tobacco Use   Smoking status: Every Day    Packs/day: 0.25    Years: 23.00    Additional pack years: 0.00    Total pack years: 5.75    Types: Cigarettes   Smokeless tobacco: Never  Vaping Use   Vaping Use: Never used  Substance and Sexual Activity   Alcohol use: Yes    Comment: occ   Drug use: Yes    Types: Marijuana   Sexual activity: Yes    Birth control/protection: None  Other Topics  Concern   Not on file  Social History Narrative   Not on file   Social Determinants of Health   Financial Resource Strain: Low Risk  (09/09/2022)   Overall Financial Resource Strain (CARDIA)    Difficulty of Paying Living Expenses: Not hard at all  Food Insecurity: No Food Insecurity (09/22/2022)   Hunger Vital Sign    Worried About Running Out of Food in the Last Year: Never true    Ran Out of Food in the Last Year: Never true  Transportation Needs: No Transportation Needs (09/22/2022)   PRAPARE - Administrator, Civil Service (Medical): No    Lack of Transportation (Non-Medical): No  Recent Concern: Transportation Needs - Unmet Transportation Needs (09/09/2022)   PRAPARE - Transportation    Lack of Transportation (Medical): Yes    Lack of Transportation (Non-Medical): Yes  Physical Activity: Insufficiently Active (09/09/2022)   Exercise Vital Sign    Days of Exercise per Week: 2 days    Minutes of Exercise per Session: 20 min  Stress: No Stress Concern Present (09/09/2022)   [employer] of Occupational Health - Occupational Stress Questionnaire    Feeling of Stress : Only a little  Social Connections: Socially Isolated (09/09/2022)   Social Connection and Isolation Panel [NHANES]    Frequency of Communication with Friends and Family: Three times a week     Frequency of Social Gatherings with Friends and Family: More than three times a week    Attends Religious Services: Never    Database administrator or Organizations: No    Attends Banker Meetings: Never    Marital Status: Separated  Intimate Partner Violence: Not At Risk (09/22/2022)   Humiliation, Afraid, Rape, and Kick questionnaire    Fear of Current or Ex-Partner: No    Emotionally Abused: No    Physically Abused: No    Sexually Abused: No    Family History: Family History  Problem Relation Age of Onset   Dermatomyositis Mother    Hypertension Mother    Cancer Mother        lung   Cancer Father        lung   Heart disease Father    Lung cancer Paternal Uncle    Lung cancer Maternal Uncle    Colon cancer Neg Hx    Breast cancer Neg Hx    Ovarian cancer Neg Hx    Endometrial cancer Neg Hx    Pancreatic cancer Neg Hx    Prostate cancer Neg Hx     Current Medications:  Current Outpatient Medications:    albuterol (VENTOLIN HFA) 108 (90 Base) MCG/ACT inhaler, Inhale 2 puffs into the lungs every 6 (six) hours as needed for wheezing or shortness of breath., Disp: 8 g, Rfl: 2   amLODipine (NORVASC) 10 MG tablet, Take 1 tablet (10 mg total) by mouth daily., Disp: 30 tablet, Rfl: 2   amoxicillin-clavulanate (AUGMENTIN) 875-125 MG tablet, Take 1 tablet by mouth 2 (two) times daily., Disp: 14 tablet, Rfl: 0   calcium carbonate (TUMS) 500 MG chewable tablet, Chew 1 tablet (200 mg of elemental calcium total) by mouth 3 (three) times daily with meals., Disp: 60 tablet, Rfl: 3   DULoxetine (CYMBALTA) 60 MG capsule, Take 1 capsule (60 mg total) by mouth daily., Disp: 30 capsule, Rfl: 3   gabapentin (NEURONTIN) 300 MG capsule, Take 1 capsule (300 mg total) by mouth 3 (three) times daily., Disp: 90 capsule, Rfl: 3  HYDROcodone-acetaminophen (NORCO/VICODIN) 5-325 MG tablet, Take 1 tablet by mouth every 8 (eight) hours as needed for moderate pain., Disp: 60 tablet, Rfl: 0    hydrOXYzine (ATARAX) 10 MG tablet, Take 1 tablet (10 mg total) by mouth 3 (three) times daily as needed., Disp: 30 tablet, Rfl: 0   lidocaine-prilocaine (EMLA) cream, Apply a dime-sized amount to port a cath site and cover with plastic wrap 1 hour prior to infusion appointments, Disp: 30 g, Rfl: 3   LORazepam (ATIVAN) 0.5 MG tablet, Take one tablet by mouth three hours prior to CT scan; take the second tablet by mouth upon arrival to radiology for procedure, Disp: 2 tablet, Rfl: 0   megestrol (MEGACE) 400 MG/10ML suspension, Take 10 mLs (400 mg total) by mouth 2 (two) times daily., Disp: 480 mL, Rfl: 1   nicotine polacrilex (NICORETTE) 4 MG gum, Take 1 each (4 mg total) by mouth as needed for smoking cessation., Disp: 100 tablet, Rfl: 0   ondansetron (ZOFRAN) 8 MG tablet, Take 1 tablet (8 mg total) by mouth every 8 (eight) hours as needed for nausea or vomiting. Start on the third day after cisplatin., Disp: 30 tablet, Rfl: 1   pantoprazole (PROTONIX) 40 MG tablet, Take 1 tablet (40 mg total) by mouth daily., Disp: 90 tablet, Rfl: 1   predniSONE (DELTASONE) 5 MG tablet, Take 1 tablet (5 mg total) by mouth daily with breakfast., Disp: 30 tablet, Rfl: 2   prochlorperazine (COMPAZINE) 10 MG tablet, Take 1 tablet (10 mg total) by mouth every 6 (six) hours as needed (Nausea or vomiting)., Disp: 30 tablet, Rfl: 1   triamcinolone cream (KENALOG) 0.1 %, Apply 1 Application topically 2 (two) times daily., Disp: 30 g, Rfl: 0   Atezolizumab (TECENTRIQ IV), Inject into the vein every 21 ( twenty-one) days. (Patient not taking: Reported on 03/03/2023), Disp: , Rfl:    Bevacizumab (AVASTIN IV), Inject into the vein every 21 ( twenty-one) days. (Patient not taking: Reported on 03/03/2023), Disp: , Rfl:    CISPLATIN IV, Inject into the vein every 21 ( twenty-one) days. (Patient not taking: Reported on 03/03/2023), Disp: , Rfl:    PACLITAXEL IV, Inject into the vein every 21 ( twenty-one) days. (Patient not taking: Reported  on 03/03/2023), Disp: , Rfl:    Allergies: Allergies  Allergen Reactions   Carrot [Daucus Carota] Hives   Lisinopril-Hydrochlorothiazide     Oral swelling   Ibuprofen Other (See Comments)    Oral swelling   Tramadol Hives   Erythromycin Hives    REVIEW OF SYSTEMS:   Review of Systems  Constitutional:  Positive for fatigue. Negative for chills and fever.  HENT:   Negative for lump/mass, mouth sores, nosebleeds, sore throat and trouble swallowing.   Eyes:  Negative for eye problems.  Respiratory:  Positive for shortness of breath. Negative for cough.   Cardiovascular:  Negative for chest pain, leg swelling and palpitations.  Gastrointestinal:  Negative for abdominal pain, constipation, diarrhea, nausea and vomiting.  Genitourinary:  Negative for bladder incontinence, difficulty urinating, dysuria, frequency, hematuria and nocturia.   Musculoskeletal:  Negative for arthralgias, back pain, flank pain, myalgias and neck pain.  Skin:  Negative for itching and rash.  Neurological:  Positive for numbness. Negative for dizziness and headaches.  Hematological:  Does not bruise/bleed easily.  Psychiatric/Behavioral:  Positive for sleep disturbance. Negative for depression and suicidal ideas. The patient is not nervous/anxious.   All other systems reviewed and are negative.    VITALS:  Blood pressure (!) 143/87, pulse (!) 117, temperature 98 F (36.7 C), resp. rate 20, weight 174 lb 3.2 oz (79 kg), last menstrual period 08/15/2022, SpO2 100 %.  Wt Readings from Last 3 Encounters:  03/03/23 174 lb 3.2 oz (79 kg)  01/15/23 180 lb (81.6 kg)  01/14/23 170 lb (77.1 kg)    Body mass index is 28.12 kg/m.  Performance status (ECOG): 1 - Symptomatic but completely ambulatory  PHYSICAL EXAM:   Physical Exam Vitals and nursing note reviewed. Exam conducted with a chaperone present.  Constitutional:      Appearance: Normal appearance.  Cardiovascular:     Rate and Rhythm: Normal rate and  regular rhythm.     Pulses: Normal pulses.     Heart sounds: Normal heart sounds.  Pulmonary:     Effort: Pulmonary effort is normal.     Breath sounds: Normal breath sounds.  Abdominal:     Palpations: Abdomen is soft. There is no hepatomegaly, splenomegaly or mass.     Tenderness: There is no abdominal tenderness.  Musculoskeletal:     Right lower leg: No edema.     Left lower leg: No edema.  Lymphadenopathy:     Cervical: No cervical adenopathy.     Right cervical: No superficial, deep or posterior cervical adenopathy.    Left cervical: No superficial, deep or posterior cervical adenopathy.     Upper Body:     Right upper body: No supraclavicular or axillary adenopathy.     Left upper body: No supraclavicular or axillary adenopathy.  Neurological:     General: No focal deficit present.     Mental Status: She is alert and oriented to person, place, and time.  Psychiatric:        Mood and Affect: Mood normal.        Behavior: Behavior normal.     LABS:      Latest Ref Rng & Units 01/15/2023    8:08 AM 01/14/2023    6:38 PM 12/24/2022    2:50 PM  CBC  WBC 4.0 - 10.5 K/uL 7.2  7.7  5.8   Hemoglobin 12.0 - 15.0 g/dL 9.9  16.1  09.6   Hematocrit 36.0 - 46.0 % 30.0  33.3  33.6   Platelets 150 - 400 K/uL 126  195  290       Latest Ref Rng & Units 03/03/2023    2:47 PM 01/15/2023    8:08 AM 01/14/2023    6:38 PM  CMP  Glucose 70 - 99 mg/dL  94  91   BUN 6 - 20 mg/dL  9  13   Creatinine 0.45 - 1.00 mg/dL  4.09  8.11   Sodium 914 - 145 mmol/L  133  133   Potassium 3.5 - 5.1 mmol/L  3.9  4.0   Chloride 98 - 111 mmol/L  103  103   CO2 22 - 32 mmol/L  20  20   Calcium 8.9 - 10.3 mg/dL  8.4  8.6   Total Protein 6.5 - 8.1 g/dL 7.6  6.6  6.8   Total Bilirubin 0.3 - 1.2 mg/dL 0.5  0.9  0.9   Alkaline Phos 38 - 126 U/L 48  94  77   AST 15 - 41 U/L 22  4,430  776   ALT 0 - 44 U/L 14  1,171  260      No results found for: "CEA1", "CEA" / No results found for: "CEA1", "CEA" No  results found for: "PSA1" No results found for: "ZOX096" No results found for: "CAN125"  No results found for: "TOTALPROTELP", "ALBUMINELP", "A1GS", "A2GS", "BETS", "BETA2SER", "GAMS", "MSPIKE", "SPEI" Lab Results  Component Value Date   TIBC 412 09/22/2022   FERRITIN 35 09/22/2022   IRONPCTSAT 7 (L) 09/22/2022   No results found for: "LDH"   STUDIES:   US Abdomen Limited RUQ (LIVER/GB)  Result Date: 02/18/2023 CLINICAL DATA:  Abnormal LFTs. EXAM: ULTRASOUND ABDOMEN LIMITED RIGHT UPPER QUADRANT COMPARISON:  CT C AP 12/29/2022 FINDINGS: Gallbladder: Gallbladder wall thickness upper limits of normal at 4 mm. No cholelithiasis. No pericholecystic fluid. Negative sonographic Murphy's sign. Common bile duct: Diameter: 3 mm Liver: Increased echogenicity. No focal lesion. Portal vein is patent on color Doppler imaging with normal direction of blood flow towards the liver. Other: None. IMPRESSION: 1. Increased hepatic parenchymal echogenicity suggestive of steatosis. 2. No cholelithiasis or sonographic evidence for acute cholecystitis. Electronically Signed   By: Annia Belt M.D.   On: 02/18/2023 09:57

## 2023-03-03 NOTE — Patient Instructions (Addendum)
[facility]  Discharge Instructions  You were seen and examined today by Dr. Ellin Saba.  Dr. Ellin Saba will arrange additional chemotherapy cycles.  Follow-up as scheduled.  Thank you for choosing [facility] to provide your oncology and hematology care.   To afford each patient quality time with our provider, please arrive at least 15 minutes before your scheduled appointment time. You may need to reschedule your appointment if you arrive late (10 or more minutes). Arriving late affects you and other patients whose appointments are after yours.  Also, if you miss three or more appointments without notifying the office, you may be dismissed from the clinic at the provider's discretion.    Again, thank you for choosing [facility].  Our hope is that these requests will decrease the amount of time that you wait before being seen by our physicians.   If you have a lab appointment with the Cancer Center - please note that after April 8th, all labs will be drawn in the cancer center.  You do not have to check in or register with the main entrance as you have in the past but will complete your check-in at the cancer center.            _____________________________________________________________  Should you have questions after your visit to [facility], please contact our office at [phone] and follow the prompts.  Our office hours are 8:00 a.m. to 4:30 p.m. Monday - Thursday and 8:00 a.m. to 2:30 p.m. Friday.  Please note that voicemails left after 4:00 p.m. may not be returned until the following business day.  We are closed weekends and all major holidays.  You do have access to a nurse 24-7, just call the main number to the clinic [phone] and do not press any options, hold on the line and a nurse will answer the phone.    For prescription refill requests, have your pharmacy contact our office and allow  72 hours.    Masks are no longer required in the cancer centers. If you would like for your care team to wear a mask while they are taking care of you, please let them know. You may have one support person who is at least 50 years old accompany you for your appointments.

## 2023-03-04 ENCOUNTER — Other Ambulatory Visit: Payer: Self-pay

## 2023-03-09 ENCOUNTER — Inpatient Hospital Stay: Payer: Medicaid Other

## 2023-03-09 ENCOUNTER — Other Ambulatory Visit: Payer: Self-pay

## 2023-03-09 VITALS — BP 144/86 | HR 101 | Temp 98.4°F | Resp 16 | Wt 173.8 lb

## 2023-03-09 DIAGNOSIS — C539 Malignant neoplasm of cervix uteri, unspecified: Secondary | ICD-10-CM | POA: Diagnosis not present

## 2023-03-09 DIAGNOSIS — C786 Secondary malignant neoplasm of retroperitoneum and peritoneum: Secondary | ICD-10-CM | POA: Diagnosis not present

## 2023-03-09 DIAGNOSIS — Z5112 Encounter for antineoplastic immunotherapy: Secondary | ICD-10-CM | POA: Diagnosis not present

## 2023-03-09 DIAGNOSIS — R634 Abnormal weight loss: Secondary | ICD-10-CM | POA: Diagnosis not present

## 2023-03-09 DIAGNOSIS — C787 Secondary malignant neoplasm of liver and intrahepatic bile duct: Secondary | ICD-10-CM | POA: Diagnosis not present

## 2023-03-09 DIAGNOSIS — Z7689 Persons encountering health services in other specified circumstances: Secondary | ICD-10-CM | POA: Diagnosis not present

## 2023-03-09 DIAGNOSIS — Z95828 Presence of other vascular implants and grafts: Secondary | ICD-10-CM

## 2023-03-09 DIAGNOSIS — D649 Anemia, unspecified: Secondary | ICD-10-CM | POA: Diagnosis not present

## 2023-03-09 DIAGNOSIS — C7802 Secondary malignant neoplasm of left lung: Secondary | ICD-10-CM | POA: Diagnosis not present

## 2023-03-09 DIAGNOSIS — C7801 Secondary malignant neoplasm of right lung: Secondary | ICD-10-CM | POA: Diagnosis not present

## 2023-03-09 DIAGNOSIS — C538 Malignant neoplasm of overlapping sites of cervix uteri: Secondary | ICD-10-CM

## 2023-03-09 DIAGNOSIS — G620 Drug-induced polyneuropathy: Secondary | ICD-10-CM | POA: Diagnosis not present

## 2023-03-09 DIAGNOSIS — F17210 Nicotine dependence, cigarettes, uncomplicated: Secondary | ICD-10-CM | POA: Diagnosis not present

## 2023-03-09 DIAGNOSIS — I10 Essential (primary) hypertension: Secondary | ICD-10-CM | POA: Diagnosis not present

## 2023-03-09 DIAGNOSIS — R87810 Cervical high risk human papillomavirus (HPV) DNA test positive: Secondary | ICD-10-CM | POA: Diagnosis not present

## 2023-03-09 DIAGNOSIS — Z5111 Encounter for antineoplastic chemotherapy: Secondary | ICD-10-CM | POA: Diagnosis not present

## 2023-03-09 LAB — CBC WITH DIFFERENTIAL/PLATELET
Abs Immature Granulocytes: 0.03 10*3/uL (ref 0.00–0.07)
Basophils Absolute: 0 10*3/uL (ref 0.0–0.1)
Basophils Relative: 0 %
Eosinophils Absolute: 0.1 10*3/uL (ref 0.0–0.5)
Eosinophils Relative: 1 %
HCT: 37.1 % (ref 36.0–46.0)
Hemoglobin: 12.3 g/dL (ref 12.0–15.0)
Immature Granulocytes: 0 %
Lymphocytes Relative: 40 %
Lymphs Abs: 2.7 10*3/uL (ref 0.7–4.0)
MCH: 32.7 pg (ref 26.0–34.0)
MCHC: 33.2 g/dL (ref 30.0–36.0)
MCV: 98.7 fL (ref 80.0–100.0)
Monocytes Absolute: 0.6 10*3/uL (ref 0.1–1.0)
Monocytes Relative: 9 %
Neutro Abs: 3.3 10*3/uL (ref 1.7–7.7)
Neutrophils Relative %: 50 %
Platelets: 193 10*3/uL (ref 150–400)
RBC: 3.76 MIL/uL — ABNORMAL LOW (ref 3.87–5.11)
RDW: 14.3 % (ref 11.5–15.5)
WBC: 6.8 10*3/uL (ref 4.0–10.5)
nRBC: 0 % (ref 0.0–0.2)

## 2023-03-09 LAB — URINALYSIS, DIPSTICK ONLY
Bilirubin Urine: NEGATIVE
Glucose, UA: NEGATIVE mg/dL
Hgb urine dipstick: NEGATIVE
Ketones, ur: NEGATIVE mg/dL
Leukocytes,Ua: NEGATIVE
Nitrite: NEGATIVE
Protein, ur: NEGATIVE mg/dL
Specific Gravity, Urine: 1.006 (ref 1.005–1.030)
pH: 6 (ref 5.0–8.0)

## 2023-03-09 LAB — COMPREHENSIVE METABOLIC PANEL
ALT: 14 U/L (ref 0–44)
AST: 27 U/L (ref 15–41)
Albumin: 3.7 g/dL (ref 3.5–5.0)
Alkaline Phosphatase: 54 U/L (ref 38–126)
Anion gap: 12 (ref 5–15)
BUN: 7 mg/dL (ref 6–20)
CO2: 21 mmol/L — ABNORMAL LOW (ref 22–32)
Calcium: 8.7 mg/dL — ABNORMAL LOW (ref 8.9–10.3)
Chloride: 102 mmol/L (ref 98–111)
Creatinine, Ser: 1.05 mg/dL — ABNORMAL HIGH (ref 0.44–1.00)
GFR, Estimated: >60 mL/min (ref >60)
Glucose, Bld: 91 mg/dL (ref 70–99)
Potassium: 3.3 mmol/L — ABNORMAL LOW (ref 3.5–5.1)
Sodium: 135 mmol/L (ref 135–145)
Total Bilirubin: 0.5 mg/dL (ref 0.3–1.2)
Total Protein: 8 g/dL (ref 6.5–8.1)

## 2023-03-09 LAB — MAGNESIUM: Magnesium: 2 mg/dL (ref 1.7–2.4)

## 2023-03-09 MED ORDER — SODIUM CHLORIDE 0.9 % IV SOLN
150.0000 mg | Freq: Once | INTRAVENOUS | Status: AC
  Administered 2023-03-09: 150 mg via INTRAVENOUS
  Filled 2023-03-09: qty 150

## 2023-03-09 MED ORDER — HEPARIN SOD (PORK) LOCK FLUSH 100 UNIT/ML IV SOLN
500.0000 [IU] | Freq: Once | INTRAVENOUS | Status: AC | PRN
  Administered 2023-03-09: 500 [IU]

## 2023-03-09 MED ORDER — HYDROCODONE-ACETAMINOPHEN 5-325 MG PO TABS
1.0000 | ORAL_TABLET | Freq: Once | ORAL | Status: AC
  Administered 2023-03-09: 1 via ORAL
  Filled 2023-03-09: qty 1

## 2023-03-09 MED ORDER — SODIUM CHLORIDE 0.9 % IV SOLN
135.0000 mg/m2 | Freq: Once | INTRAVENOUS | Status: AC
  Administered 2023-03-09: 276 mg via INTRAVENOUS
  Filled 2023-03-09: qty 46

## 2023-03-09 MED ORDER — SODIUM CHLORIDE 0.9 % IV SOLN
50.0000 mg/m2 | Freq: Once | INTRAVENOUS | Status: AC
  Administered 2023-03-09: 100 mg via INTRAVENOUS
  Filled 2023-03-09: qty 100

## 2023-03-09 MED ORDER — FAMOTIDINE IN NACL 20-0.9 MG/50ML-% IV SOLN
20.0000 mg | Freq: Once | INTRAVENOUS | Status: AC
  Administered 2023-03-09: 20 mg via INTRAVENOUS
  Filled 2023-03-09: qty 50

## 2023-03-09 MED ORDER — SODIUM CHLORIDE 0.9% FLUSH
10.0000 mL | INTRAVENOUS | Status: DC | PRN
  Administered 2023-03-09: 10 mL

## 2023-03-09 MED ORDER — SODIUM CHLORIDE 0.9 % IV SOLN
15.0000 mg/kg | Freq: Once | INTRAVENOUS | Status: AC
  Administered 2023-03-09: 1300 mg via INTRAVENOUS
  Filled 2023-03-09: qty 48

## 2023-03-09 MED ORDER — CETIRIZINE HCL 10 MG/ML IV SOLN
10.0000 mg | Freq: Once | INTRAVENOUS | Status: AC
  Administered 2023-03-09: 10 mg via INTRAVENOUS
  Filled 2023-03-09: qty 1

## 2023-03-09 MED ORDER — SODIUM CHLORIDE 0.9 % IV SOLN: Freq: Once | INTRAVENOUS | Status: AC

## 2023-03-09 MED ORDER — MAGNESIUM SULFATE 2 GM/50ML IV SOLN
2.0000 g | Freq: Once | INTRAVENOUS | Status: AC
  Administered 2023-03-09: 2 g via INTRAVENOUS
  Filled 2023-03-09: qty 50

## 2023-03-09 MED ORDER — PALONOSETRON HCL INJECTION 0.25 MG/5ML
0.2500 mg | Freq: Once | INTRAVENOUS | Status: AC
  Administered 2023-03-09: 0.25 mg via INTRAVENOUS
  Filled 2023-03-09: qty 5

## 2023-03-09 MED ORDER — SODIUM CHLORIDE 0.9 % IV SOLN
200.0000 mg | Freq: Once | INTRAVENOUS | Status: AC
  Administered 2023-03-09: 200 mg via INTRAVENOUS
  Filled 2023-03-09: qty 8

## 2023-03-09 MED ORDER — SODIUM CHLORIDE 0.9 % IV SOLN
10.0000 mg | Freq: Once | INTRAVENOUS | Status: AC
  Administered 2023-03-09: 10 mg via INTRAVENOUS
  Filled 2023-03-09: qty 10

## 2023-03-09 MED ORDER — POTASSIUM CHLORIDE IN NACL 20-0.9 MEQ/L-% IV SOLN
Freq: Once | INTRAVENOUS | Status: AC
  Filled 2023-03-09: qty 1000

## 2023-03-09 NOTE — Progress Notes (Signed)
Patient presents today for Keytruda/Vegzelma/Taxol/Cisplatin infusion per providers order.  Vital signs within parameters for treatment.  Labs pending.  Patient has no new complaints at this time.  Labs within parameters for treatment.  Patient unable to void to get urine protein, will continue to encourage fluids to obtain urine sample.  Patient voided 200 cc.  Treatment given today per MD orders.  Stable during infusion without adverse affects.  Vital signs stable.  No complaints at this time.  Discharge from clinic ambulatory in stable condition.  Alert and oriented X 3.  Follow up with [facility] as scheduled.

## 2023-03-09 NOTE — Patient Instructions (Signed)
[facility]  Discharge Instructions: Thank you for choosing [facility] to provide your oncology and hematology care.  If you have a lab appointment with the Cancer Center - please note that after April 8th, 2024, all labs will be drawn in the cancer center.  You do not have to check in or register with the main entrance as you have in the past but will complete your check-in in the cancer center.  Wear comfortable clothing and clothing appropriate for easy access to any Portacath or PICC line.   We strive to give you quality time with your provider. You may need to reschedule your appointment if you arrive late (15 or more minutes).  Arriving late affects you and other patients whose appointments are after yours.  Also, if you miss three or more appointments without notifying the office, you may be dismissed from the clinic at the provider's discretion.      For prescription refill requests, have your pharmacy contact our office and allow 72 hours for refills to be completed.    Today you received the following chemotherapy and/or immunotherapy agents Vegzelma/Keytruda/Taxol/Ccisplatin      To help prevent nausea and vomiting after your treatment, we encourage you to take your nausea medication as directed.  BELOW ARE SYMPTOMS THAT SHOULD BE REPORTED IMMEDIATELY: *FEVER GREATER THAN 100.4 F (38 C) OR HIGHER *CHILLS OR SWEATING *NAUSEA AND VOMITING THAT IS NOT CONTROLLED WITH YOUR NAUSEA MEDICATION *UNUSUAL SHORTNESS OF BREATH *UNUSUAL BRUISING OR BLEEDING *URINARY PROBLEMS (pain or burning when urinating, or frequent urination) *BOWEL PROBLEMS (unusual diarrhea, constipation, pain near the anus) TENDERNESS IN MOUTH AND THROAT WITH OR WITHOUT PRESENCE OF ULCERS (sore throat, sores in mouth, or a toothache) UNUSUAL RASH, SWELLING OR PAIN  UNUSUAL VAGINAL DISCHARGE OR ITCHING   Items with * indicate a potential emergency and should be followed up as soon  as possible or go to the Emergency Department if any problems should occur.  Please show the CHEMOTHERAPY ALERT CARD or IMMUNOTHERAPY ALERT CARD at check-in to the Emergency Department and triage nurse.  Should you have questions after your visit or need to cancel or reschedule your appointment, please contact [facility] [phone]  and follow the prompts.  Office hours are 8:00 a.m. to 4:30 p.m. Monday - Friday. Please note that voicemails left after 4:00 p.m. may not be returned until the following business day.  We are closed weekends and major holidays. You have access to a nurse at all times for urgent questions. Please call the main number to the clinic [phone] and follow the prompts.  For any non-urgent questions, you may also contact your provider using MyChart. We now offer e-Visits for anyone 57 and older to request care online for non-urgent symptoms. For details visit [email or web address].   Also download the MyChart app! Go to the app store, search "MyChart", open the app, select [place], and log in with your MyChart username and password.

## 2023-03-12 ENCOUNTER — Other Ambulatory Visit: Payer: Self-pay | Admitting: *Deleted

## 2023-03-12 ENCOUNTER — Telehealth: Payer: Self-pay | Admitting: *Deleted

## 2023-03-12 DIAGNOSIS — G893 Neoplasm related pain (acute) (chronic): Secondary | ICD-10-CM

## 2023-03-12 MED ORDER — LOPERAMIDE HCL 2 MG PO CAPS: 2.0000 mg | ORAL_CAPSULE | ORAL | 0 refills | Status: DC | PRN

## 2023-03-12 MED ORDER — HYDROCODONE-ACETAMINOPHEN 5-325 MG PO TABS
1.0000 | ORAL_TABLET | Freq: Three times a day (TID) | ORAL | 0 refills | Status: DC | PRN
Start: 2023-03-12 — End: 2023-04-02

## 2023-03-12 NOTE — Telephone Encounter (Signed)
Patient called to advise that she has had multiple diarrhea stools since treatment.  Per standing order, will send Imodium for her.  She also states that she has had severe abdominal pain, however does not have any pain medication left.  Refill request sent to Dr. Ellin Saba.  Offered to make appointment for fluids tomorrow, however she does not have transportation.  Advised to call 911 if continues and she feels dehydrated over the weekend, despite medications.  Verbalized understanding.

## 2023-03-19 ENCOUNTER — Other Ambulatory Visit: Payer: Self-pay

## 2023-03-19 DIAGNOSIS — C538 Malignant neoplasm of overlapping sites of cervix uteri: Secondary | ICD-10-CM

## 2023-03-29 NOTE — Progress Notes (Signed)
[facility] [address] [place] [address]    Clinic Day:  03/29/2023  Referring physician: Billie Lade, MD  Patient Care Team: Billie Lade, MD as PCP - General (Internal Medicine) Doreatha Massed, MD as Medical Oncologist (Medical Oncology) Therese Sarah, RN as Oncology Nurse Navigator (Medical Oncology)   ASSESSMENT & PLAN:   Assessment: 1.  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 - CTAP (08/30/2022): Irregularity of the endometrium extending through the cervix with foci of gas in the cervix.  Multifocal omental and peritoneal metastasis.  Metastatic left periaortic, aortocaval, left external iliac and right common iliac lymph nodes.  Multiple bilateral lung nodules at bases.  12 mm hypodensity in the liver near the falciform ligament. - Endometrial biopsy (09/09/2022): Invasive moderately to poorly differentiated squamous cell carcinoma with abundant necrosis.  Cervix biopsy: Invasive and in situ moderate to poorly differentiated squamous cell carcinoma.  High risk HPV positive. - NGS: PD-L1 (22 C3): CPS 2, HER2 IHC negative.  PIK3CA exon 10 pathogenic variant.  MS-stable.  TMB-low.  FANCM and SDHA pathogenic variants. - PD-L1 CPS: 8% - Cycle 1 of cisplatin, paclitaxel, bevacizumab on 10/02/2022, pembrolizumab added with cycle 2 on 10/23/2022 - Bone scan (10/14/2022): No metastatic disease   2.  Social/family history: - She is married and lives at home with her husband and roommate.  She has never worked.  She is current active smoker, half pack per day, for the last 28 years.  She occasionally smokes marijuana. - Mother died of lung cancer.  Father had head and neck cancer.  Maternal grandmother had cancer.  Paternal uncle died of cancer.  Maternal uncle also had cancer.    Plan: 1.   Metastatic cervical cancer to the lungs, peritoneum and liver: - She received 5 cycles of chemoimmunotherapy with Keytruda completed on 12/25/2022. - She had elevated LFTs on 01/15/2023 and the chemotherapy was held.  An ultrasound of the abdomen on 02/18/2023 showed hepatic steatosis. - She missed her appointment as one of her family members died and she had transportation issues. - Today she is willing to go back on chemotherapy. - I have reviewed her LFTs today which are completely normal.  Will schedule for next cycle of treatment next week. - Follow-up in 3 weeks after next cycle.   2.  Normocytic anemia: - She has received intermittent Feraheme in the past.  Last hemoglobin is 9.9.   3.  Peripheral neuropathy: - She reports neuropathic pains in the feet, combination of burning pain/toothache/numbness.  She could not tolerate Cymbalta because of nausea. - Will start her on gabapentin 300 mg 3 times daily.   4.  Hypertension: - Continue amlodipine 10 mg daily.  Blood pressure today is 140/87.   5.  Weight loss: - She lost about 4 pounds since last visit.  Will closely monitor.  She reports appetite is good.      No orders of the defined types were placed in this encounter.    Alben Deeds Teague,acting as a Neurosurgeon for Doreatha Massed, MD.,have documented all relevant documentation on the behalf of Doreatha Massed, MD,as directed by  Doreatha Massed, MD while in the presence of Doreatha Massed, MD.  ***   [place]   7/28/202410:07 PM  CHIEF COMPLAINT:   Diagnosis: metastatic cervical squamous  cell carcinoma    Cancer Staging  Cervical cancer [facility]) Staging form: Cervix Uteri, AJCC Version 9 - Clinical stage from 09/19/2022: FIGO Stage IVB (cT4, cN2a, cM1) - Unsigned    Prior Therapy: none  Current Therapy:  Cisplatin + Paclitaxel + Bevacizumab, Keytruda   HISTORY OF PRESENT ILLNESS:   Oncology History Overview Note  PD-L1 CPS 8%   Cervical  cancer (HCC)  09/19/2022 Initial Diagnosis   Cervical cancer (HCC)   10/02/2022 -  Chemotherapy   [name]        INTERVAL HISTORY:   Leath is a 52 y.o. female presenting to clinic today for follow up of metastatic cervical squamous cell carcinoma. She was last seen by me on 03/03/23.  Today, she states that she is doing well overall. Her appetite level is at ***%. Her energy level is at ***%.  PAST MEDICAL HISTORY:   Past Medical History: Past Medical History:  Diagnosis Date   Anemia    Asthma    Bronchitis    Cancer (HCC)    Cervical   CHF (congestive heart failure) (HCC)    COPD (chronic obstructive pulmonary disease) (HCC)    Depression    GERD (gastroesophageal reflux disease)    Headache    Hypertension    Port-A-Cath in place 09/25/2022    Surgical History: Past Surgical History:  Procedure Laterality Date   BREAST SURGERY     CESAREAN SECTION     IR IMAGING GUIDED PORT INSERTION  09/30/2022   LEG SURGERY      Social History: Social History   Socioeconomic History   Marital status: Married    Spouse name: Not on file   Number of children: Not on file   Years of education: Not on file   Highest education level: Not on file  Occupational History   Not on file  Tobacco Use   Smoking status: Every Day    Current packs/day: 0.25    Average packs/day: 0.3 packs/day for 23.0 years (5.8 ttl pk-yrs)    Types: Cigarettes   Smokeless tobacco: Never  Vaping Use   Vaping status: Never Used  Substance and Sexual Activity   Alcohol use: Yes    Comment: occ   Drug use: Yes    Types: Marijuana   Sexual activity: Yes    Birth control/protection: None  Other Topics Concern   Not on file  Social History Narrative   Not on file   Social Determinants of Health   Financial Resource Strain: Low Risk  (09/09/2022)   Overall Financial Resource Strain  (CARDIA)    Difficulty of Paying Living Expenses: Not hard at all  Food Insecurity: No Food Insecurity (09/22/2022)   Hunger Vital Sign    Worried About Running Out of Food in the Last Year: Never true    Ran Out of Food in the Last Year: Never true  Transportation Needs: No Transportation Needs (09/22/2022)   PRAPARE - Administrator, Civil Service (Medical): No    Lack of Transportation (Non-Medical): No  Recent Concern: Transportation Needs - Unmet Transportation Needs (09/09/2022)   PRAPARE - Transportation    Lack of Transportation (Medical): Yes    Lack of Transportation (Non-Medical): Yes  Physical Activity: Insufficiently Active (09/09/2022)   Exercise Vital Sign    Days of Exercise per Week: 2 days    Minutes of  Exercise per Session: 20 min  Stress: No Stress Concern Present (09/09/2022)   [employer] of Occupational Health - Occupational Stress Questionnaire    Feeling of Stress : Only a little  Social Connections: Socially Isolated (09/09/2022)   Social Connection and Isolation Panel [NHANES]    Frequency of Communication with Friends and Family: Three times a week    Frequency of Social Gatherings with Friends and Family: More than three times a week    Attends Religious Services: Never    Database administrator or Organizations: No    Attends Banker Meetings: Never    Marital Status: Separated  Intimate Partner Violence: Not At Risk (09/22/2022)   Humiliation, Afraid, Rape, and Kick questionnaire    Fear of Current or Ex-Partner: No    Emotionally Abused: No    Physically Abused: No    Sexually Abused: No    Family History: Family History  Problem Relation Age of Onset   Dermatomyositis Mother    Hypertension Mother    Cancer Mother        lung   Cancer Father        lung   Heart disease Father    Lung cancer Paternal Uncle    Lung cancer Maternal Uncle    Colon cancer Neg Hx    Breast cancer Neg Hx    Ovarian cancer Neg Hx     Endometrial cancer Neg Hx    Pancreatic cancer Neg Hx    Prostate cancer Neg Hx     Current Medications:  Current Outpatient Medications:    albuterol (VENTOLIN HFA) 108 (90 Base) MCG/ACT inhaler, Inhale 2 puffs into the lungs every 6 (six) hours as needed for wheezing or shortness of breath., Disp: 8 g, Rfl: 2   amLODipine (NORVASC) 10 MG tablet, Take 1 tablet (10 mg total) by mouth daily., Disp: 30 tablet, Rfl: 2   amoxicillin-clavulanate (AUGMENTIN) 875-125 MG tablet, Take 1 tablet by mouth 2 (two) times daily., Disp: 14 tablet, Rfl: 0   Atezolizumab (TECENTRIQ IV), Inject into the vein every 21 ( twenty-one) days., Disp: , Rfl:    Bevacizumab (AVASTIN IV), Inject into the vein every 21 ( twenty-one) days., Disp: , Rfl:    calcium carbonate (TUMS) 500 MG chewable tablet, Chew 1 tablet (200 mg of elemental calcium total) by mouth 3 (three) times daily with meals., Disp: 60 tablet, Rfl: 3   CISPLATIN IV, Inject into the vein every 21 ( twenty-one) days., Disp: , Rfl:    DULoxetine (CYMBALTA) 60 MG capsule, Take 1 capsule (60 mg total) by mouth daily., Disp: 30 capsule, Rfl: 3   gabapentin (NEURONTIN) 300 MG capsule, Take 1 capsule (300 mg total) by mouth 3 (three) times daily., Disp: 90 capsule, Rfl: 3   HYDROcodone-acetaminophen (NORCO/VICODIN) 5-325 MG tablet, Take 1 tablet by mouth every 8 (eight) hours as needed for moderate pain., Disp: 60 tablet, Rfl: 0   hydrOXYzine (ATARAX) 10 MG tablet, Take 1 tablet (10 mg total) by mouth 3 (three) times daily as needed., Disp: 30 tablet, Rfl: 0   lidocaine-prilocaine (EMLA) cream, Apply a dime-sized amount to port a cath site and cover with plastic wrap 1 hour prior to infusion appointments, Disp: 30 g, Rfl: 3   loperamide (IMODIUM) 2 MG capsule, Take 1 capsule (2 mg total) by mouth as needed for diarrhea or loose stools (Take 2 capsules after the first loose stool and 1 capsule after each loose stool thereafter.  DO NOT  EXCEED 8 capsules a day.).,  Disp: 30 capsule, Rfl: 0   LORazepam (ATIVAN) 0.5 MG tablet, Take one tablet by mouth three hours prior to CT scan; take the second tablet by mouth upon arrival to radiology for procedure, Disp: 2 tablet, Rfl: 0   megestrol (MEGACE) 400 MG/10ML suspension, Take 10 mLs (400 mg total) by mouth 2 (two) times daily., Disp: 480 mL, Rfl: 1   nicotine polacrilex (NICORETTE) 4 MG gum, Take 1 each (4 mg total) by mouth as needed for smoking cessation., Disp: 100 tablet, Rfl: 0   ondansetron (ZOFRAN) 8 MG tablet, Take 1 tablet (8 mg total) by mouth every 8 (eight) hours as needed for nausea or vomiting. Start on the third day after cisplatin., Disp: 30 tablet, Rfl: 1   PACLITAXEL IV, Inject into the vein every 21 ( twenty-one) days., Disp: , Rfl:    pantoprazole (PROTONIX) 40 MG tablet, Take 1 tablet (40 mg total) by mouth daily., Disp: 90 tablet, Rfl: 1   predniSONE (DELTASONE) 5 MG tablet, Take 1 tablet (5 mg total) by mouth daily with breakfast., Disp: 30 tablet, Rfl: 2   prochlorperazine (COMPAZINE) 10 MG tablet, Take 1 tablet (10 mg total) by mouth every 6 (six) hours as needed (Nausea or vomiting)., Disp: 30 tablet, Rfl: 1   triamcinolone cream (KENALOG) 0.1 %, Apply 1 Application topically 2 (two) times daily., Disp: 30 g, Rfl: 0   Allergies: Allergies  Allergen Reactions   Carrot [Daucus Carota] Hives   Lisinopril-Hydrochlorothiazide     Oral swelling   Ibuprofen Other (See Comments)    Oral swelling   Tramadol Hives   Erythromycin Hives    REVIEW OF SYSTEMS:   Review of Systems  Constitutional:  Negative for chills, fatigue and fever.  HENT:   Negative for lump/mass, mouth sores, nosebleeds, sore throat and trouble swallowing.   Eyes:  Negative for eye problems.  Respiratory:  Negative for cough and shortness of breath.   Cardiovascular:  Negative for chest pain, leg swelling and palpitations.  Gastrointestinal:  Negative for abdominal pain, constipation, diarrhea, nausea and vomiting.   Genitourinary:  Negative for bladder incontinence, difficulty urinating, dysuria, frequency, hematuria and nocturia.   Musculoskeletal:  Negative for arthralgias, back pain, flank pain, myalgias and neck pain.  Skin:  Negative for itching and rash.  Neurological:  Negative for dizziness, headaches and numbness.  Hematological:  Does not bruise/bleed easily.  Psychiatric/Behavioral:  Negative for depression, sleep disturbance and suicidal ideas. The patient is not nervous/anxious.   All other systems reviewed and are negative.    VITALS:   Last menstrual period 08/15/2022.  Wt Readings from Last 3 Encounters:  03/09/23 173 lb 12.8 oz (78.8 kg)  03/03/23 174 lb 3.2 oz (79 kg)  01/15/23 180 lb (81.6 kg)    There is no height or weight on file to calculate BMI.  Performance status (ECOG): 1 - Symptomatic but completely ambulatory  PHYSICAL EXAM:   Physical Exam Vitals and nursing note reviewed. Exam conducted with a chaperone present.  Constitutional:      Appearance: Normal appearance.  Cardiovascular:     Rate and Rhythm: Normal rate and regular rhythm.     Pulses: Normal pulses.     Heart sounds: Normal heart sounds.  Pulmonary:     Effort: Pulmonary effort is normal.     Breath sounds: Normal breath sounds.  Abdominal:     Palpations: Abdomen is soft. There is no hepatomegaly, splenomegaly or mass.  Tenderness: There is no abdominal tenderness.  Musculoskeletal:     Right lower leg: No edema.     Left lower leg: No edema.  Lymphadenopathy:     Cervical: No cervical adenopathy.     Right cervical: No superficial, deep or posterior cervical adenopathy.    Left cervical: No superficial, deep or posterior cervical adenopathy.     Upper Body:     Right upper body: No supraclavicular or axillary adenopathy.     Left upper body: No supraclavicular or axillary adenopathy.  Neurological:     General: No focal deficit present.     Mental Status: She is alert and oriented  to person, place, and time.  Psychiatric:        Mood and Affect: Mood normal.        Behavior: Behavior normal.     LABS:      Latest Ref Rng & Units 03/09/2023    7:46 AM 01/15/2023    8:08 AM 01/14/2023    6:38 PM  CBC  WBC 4.0 - 10.5 K/uL 6.8  7.2  7.7   Hemoglobin 12.0 - 15.0 g/dL 82.9  9.9  56.2   Hematocrit 36.0 - 46.0 % 37.1  30.0  33.3   Platelets 150 - 400 K/uL 193  126  195       Latest Ref Rng & Units 03/09/2023    7:46 AM 03/03/2023    2:47 PM 01/15/2023    8:08 AM  CMP  Glucose 70 - 99 mg/dL 91   94   BUN 6 - 20 mg/dL 7   9   Creatinine 1.30 - 1.00 mg/dL 8.65   7.84   Sodium 696 - 145 mmol/L 135   133   Potassium 3.5 - 5.1 mmol/L 3.3   3.9   Chloride 98 - 111 mmol/L 102   103   CO2 22 - 32 mmol/L 21   20   Calcium 8.9 - 10.3 mg/dL 8.7   8.4   Total Protein 6.5 - 8.1 g/dL 8.0  7.6  6.6   Total Bilirubin 0.3 - 1.2 mg/dL 0.5  0.5  0.9   Alkaline Phos 38 - 126 U/L 54  48  94   AST 15 - 41 U/L 27  22  4,430   ALT 0 - 44 U/L 14  14  1,171      No results found for: "CEA1", "CEA" / No results found for: "CEA1", "CEA" No results found for: "PSA1" No results found for: "EXB284" No results found for: "CAN125"  No results found for: "TOTALPROTELP", "ALBUMINELP", "A1GS", "A2GS", "BETS", "BETA2SER", "GAMS", "MSPIKE", "SPEI" Lab Results  Component Value Date   TIBC 412 09/22/2022   FERRITIN 35 09/22/2022   IRONPCTSAT 7 (L) 09/22/2022   No results found for: "LDH"   STUDIES:   No results found.

## 2023-03-30 ENCOUNTER — Inpatient Hospital Stay: Payer: Medicaid Other

## 2023-03-30 ENCOUNTER — Other Ambulatory Visit: Payer: Self-pay | Admitting: *Deleted

## 2023-03-30 ENCOUNTER — Inpatient Hospital Stay (HOSPITAL_BASED_OUTPATIENT_CLINIC_OR_DEPARTMENT_OTHER): Payer: Medicaid Other | Admitting: Hematology

## 2023-03-30 VITALS — BP 148/91 | HR 96 | Temp 97.4°F | Resp 18

## 2023-03-30 DIAGNOSIS — C7801 Secondary malignant neoplasm of right lung: Secondary | ICD-10-CM | POA: Diagnosis not present

## 2023-03-30 DIAGNOSIS — F17210 Nicotine dependence, cigarettes, uncomplicated: Secondary | ICD-10-CM | POA: Diagnosis not present

## 2023-03-30 DIAGNOSIS — G620 Drug-induced polyneuropathy: Secondary | ICD-10-CM | POA: Diagnosis not present

## 2023-03-30 DIAGNOSIS — C538 Malignant neoplasm of overlapping sites of cervix uteri: Secondary | ICD-10-CM

## 2023-03-30 DIAGNOSIS — Z5112 Encounter for antineoplastic immunotherapy: Secondary | ICD-10-CM | POA: Diagnosis not present

## 2023-03-30 DIAGNOSIS — C7802 Secondary malignant neoplasm of left lung: Secondary | ICD-10-CM | POA: Diagnosis not present

## 2023-03-30 DIAGNOSIS — C786 Secondary malignant neoplasm of retroperitoneum and peritoneum: Secondary | ICD-10-CM | POA: Diagnosis not present

## 2023-03-30 DIAGNOSIS — Z7689 Persons encountering health services in other specified circumstances: Secondary | ICD-10-CM | POA: Diagnosis not present

## 2023-03-30 DIAGNOSIS — Z95828 Presence of other vascular implants and grafts: Secondary | ICD-10-CM

## 2023-03-30 DIAGNOSIS — D649 Anemia, unspecified: Secondary | ICD-10-CM | POA: Diagnosis not present

## 2023-03-30 DIAGNOSIS — R7989 Other specified abnormal findings of blood chemistry: Secondary | ICD-10-CM

## 2023-03-30 DIAGNOSIS — C787 Secondary malignant neoplasm of liver and intrahepatic bile duct: Secondary | ICD-10-CM | POA: Diagnosis not present

## 2023-03-30 DIAGNOSIS — C539 Malignant neoplasm of cervix uteri, unspecified: Secondary | ICD-10-CM | POA: Diagnosis not present

## 2023-03-30 DIAGNOSIS — R87810 Cervical high risk human papillomavirus (HPV) DNA test positive: Secondary | ICD-10-CM | POA: Diagnosis not present

## 2023-03-30 DIAGNOSIS — R634 Abnormal weight loss: Secondary | ICD-10-CM | POA: Diagnosis not present

## 2023-03-30 DIAGNOSIS — I10 Essential (primary) hypertension: Secondary | ICD-10-CM | POA: Diagnosis not present

## 2023-03-30 DIAGNOSIS — Z5111 Encounter for antineoplastic chemotherapy: Secondary | ICD-10-CM | POA: Diagnosis not present

## 2023-03-30 LAB — COMPREHENSIVE METABOLIC PANEL
ALT: 12 U/L (ref 0–44)
AST: 17 U/L (ref 15–41)
Albumin: 3 g/dL — ABNORMAL LOW (ref 3.5–5.0)
Alkaline Phosphatase: 57 U/L (ref 38–126)
Anion gap: 6 (ref 5–15)
BUN: 9 mg/dL (ref 6–20)
CO2: 25 mmol/L (ref 22–32)
Calcium: 8.3 mg/dL — ABNORMAL LOW (ref 8.9–10.3)
Chloride: 104 mmol/L (ref 98–111)
Creatinine, Ser: 1.01 mg/dL — ABNORMAL HIGH (ref 0.44–1.00)
GFR, Estimated: >60 mL/min (ref >60)
Glucose, Bld: 92 mg/dL (ref 70–99)
Potassium: 3.7 mmol/L (ref 3.5–5.1)
Sodium: 135 mmol/L (ref 135–145)
Total Bilirubin: 0.3 mg/dL (ref 0.3–1.2)
Total Protein: 6.6 g/dL (ref 6.5–8.1)

## 2023-03-30 LAB — CBC WITH DIFFERENTIAL/PLATELET
Abs Immature Granulocytes: 0.02 10*3/uL (ref 0.00–0.07)
Basophils Absolute: 0 10*3/uL (ref 0.0–0.1)
Basophils Relative: 1 %
Eosinophils Absolute: 0.2 10*3/uL (ref 0.0–0.5)
Eosinophils Relative: 3 %
HCT: 30.7 % — ABNORMAL LOW (ref 36.0–46.0)
Hemoglobin: 10.3 g/dL — ABNORMAL LOW (ref 12.0–15.0)
Immature Granulocytes: 0 %
Lymphocytes Relative: 40 %
Lymphs Abs: 2 10*3/uL (ref 0.7–4.0)
MCH: 32.9 pg (ref 26.0–34.0)
MCHC: 33.6 g/dL (ref 30.0–36.0)
MCV: 98.1 fL (ref 80.0–100.0)
Monocytes Absolute: 0.8 10*3/uL (ref 0.1–1.0)
Monocytes Relative: 16 %
Neutro Abs: 2 10*3/uL (ref 1.7–7.7)
Neutrophils Relative %: 40 %
Platelets: 311 10*3/uL (ref 150–400)
RBC: 3.13 MIL/uL — ABNORMAL LOW (ref 3.87–5.11)
RDW: 13.9 % (ref 11.5–15.5)
WBC: 5.1 10*3/uL (ref 4.0–10.5)
nRBC: 0 % (ref 0.0–0.2)

## 2023-03-30 LAB — MAGNESIUM: Magnesium: 1.7 mg/dL (ref 1.7–2.4)

## 2023-03-30 LAB — TSH: TSH: 5.325 u[IU]/mL — ABNORMAL HIGH (ref 0.350–4.500)

## 2023-03-30 MED ORDER — SODIUM CHLORIDE 0.9 % IV SOLN
564.0000 mg | Freq: Once | INTRAVENOUS | Status: AC
  Administered 2023-03-30: 560 mg via INTRAVENOUS
  Filled 2023-03-30: qty 56

## 2023-03-30 MED ORDER — HYDROCODONE-ACETAMINOPHEN 5-325 MG PO TABS
2.0000 | ORAL_TABLET | Freq: Once | ORAL | Status: AC
  Administered 2023-03-30: 2 via ORAL
  Filled 2023-03-30: qty 2

## 2023-03-30 MED ORDER — PALONOSETRON HCL INJECTION 0.25 MG/5ML
0.2500 mg | Freq: Once | INTRAVENOUS | Status: AC
  Administered 2023-03-30: 0.25 mg via INTRAVENOUS
  Filled 2023-03-30: qty 5

## 2023-03-30 MED ORDER — SODIUM CHLORIDE 0.9 % IV SOLN
135.0000 mg/m2 | Freq: Once | INTRAVENOUS | Status: AC
  Administered 2023-03-30: 276 mg via INTRAVENOUS
  Filled 2023-03-30: qty 46

## 2023-03-30 MED ORDER — GABAPENTIN 600 MG PO TABS: 600.0000 mg | ORAL_TABLET | Freq: Three times a day (TID) | ORAL | 3 refills | Status: DC

## 2023-03-30 MED ORDER — SODIUM CHLORIDE 0.9 % IV SOLN
150.0000 mg | Freq: Once | INTRAVENOUS | Status: AC
  Administered 2023-03-30: 150 mg via INTRAVENOUS
  Filled 2023-03-30: qty 150

## 2023-03-30 MED ORDER — SODIUM CHLORIDE 0.9 % IV SOLN
10.0000 mg | Freq: Once | INTRAVENOUS | Status: AC
  Administered 2023-03-30: 10 mg via INTRAVENOUS
  Filled 2023-03-30: qty 10

## 2023-03-30 MED ORDER — SODIUM CHLORIDE 0.9 % IV SOLN: Freq: Once | INTRAVENOUS | Status: AC

## 2023-03-30 MED ORDER — SODIUM CHLORIDE 0.9 % IV SOLN
15.0000 mg/kg | Freq: Once | INTRAVENOUS | Status: AC
  Administered 2023-03-30: 1300 mg via INTRAVENOUS
  Filled 2023-03-30: qty 32

## 2023-03-30 MED ORDER — SODIUM CHLORIDE 0.9 % IV SOLN
200.0000 mg | Freq: Once | INTRAVENOUS | Status: AC
  Administered 2023-03-30: 200 mg via INTRAVENOUS
  Filled 2023-03-30: qty 8

## 2023-03-30 MED ORDER — HEPARIN SOD (PORK) LOCK FLUSH 100 UNIT/ML IV SOLN
500.0000 [IU] | Freq: Once | INTRAVENOUS | Status: AC | PRN
  Administered 2023-03-30: 500 [IU]

## 2023-03-30 MED ORDER — FAMOTIDINE IN NACL 20-0.9 MG/50ML-% IV SOLN
20.0000 mg | Freq: Once | INTRAVENOUS | Status: AC
  Administered 2023-03-30: 20 mg via INTRAVENOUS
  Filled 2023-03-30: qty 50

## 2023-03-30 MED ORDER — CETIRIZINE HCL 10 MG/ML IV SOLN
10.0000 mg | Freq: Once | INTRAVENOUS | Status: AC
  Administered 2023-03-30: 10 mg via INTRAVENOUS
  Filled 2023-03-30: qty 1

## 2023-03-30 MED ORDER — SODIUM CHLORIDE 0.9% FLUSH
10.0000 mL | INTRAVENOUS | Status: DC | PRN
  Administered 2023-03-30: 10 mL

## 2023-03-30 NOTE — Progress Notes (Signed)
Patient has been examined by Dr. Ellin Saba. Vital signs and labs have been reviewed by MD - ANC, Creatinine, LFTs, hemoglobin, and platelets are within treatment parameters per M.D. - pt may proceed with treatment. Cisplatin is being d/c'd by MD and changed to carboplatin AUC 5. Primary RN and pharmacy notified.

## 2023-03-30 NOTE — Patient Instructions (Signed)
[facility]  Discharge Instructions: Thank you for choosing [facility] to provide your oncology and hematology care.  If you have a lab appointment with the Cancer Center - please note that after April 8th, 2024, all labs will be drawn in the cancer center.  You do not have to check in or register with the main entrance as you have in the past but will complete your check-in in the cancer center.  Wear comfortable clothing and clothing appropriate for easy access to any Portacath or PICC line.   We strive to give you quality time with your provider. You may need to reschedule your appointment if you arrive late (15 or more minutes).  Arriving late affects you and other patients whose appointments are after yours.  Also, if you miss three or more appointments without notifying the office, you may be dismissed from the clinic at the provider's discretion.      For prescription refill requests, have your pharmacy contact our office and allow 72 hours for refills to be completed.    Today you received the following chemotherapy and/or immunotherapy agents Keytruda/Vegzelma/Taxol/Carboplatin      To help prevent nausea and vomiting after your treatment, we encourage you to take your nausea medication as directed.  BELOW ARE SYMPTOMS THAT SHOULD BE REPORTED IMMEDIATELY: *FEVER GREATER THAN 100.4 F (38 C) OR HIGHER *CHILLS OR SWEATING *NAUSEA AND VOMITING THAT IS NOT CONTROLLED WITH YOUR NAUSEA MEDICATION *UNUSUAL SHORTNESS OF BREATH *UNUSUAL BRUISING OR BLEEDING *URINARY PROBLEMS (pain or burning when urinating, or frequent urination) *BOWEL PROBLEMS (unusual diarrhea, constipation, pain near the anus) TENDERNESS IN MOUTH AND THROAT WITH OR WITHOUT PRESENCE OF ULCERS (sore throat, sores in mouth, or a toothache) UNUSUAL RASH, SWELLING OR PAIN  UNUSUAL VAGINAL DISCHARGE OR ITCHING   Items with * indicate a potential emergency and should be followed up as soon  as possible or go to the Emergency Department if any problems should occur.  Please show the CHEMOTHERAPY ALERT CARD or IMMUNOTHERAPY ALERT CARD at check-in to the Emergency Department and triage nurse.  Should you have questions after your visit or need to cancel or reschedule your appointment, please contact [facility] [phone]  and follow the prompts.  Office hours are 8:00 a.m. to 4:30 p.m. Monday - Friday. Please note that voicemails left after 4:00 p.m. may not be returned until the following business day.  We are closed weekends and major holidays. You have access to a nurse at all times for urgent questions. Please call the main number to the clinic [phone] and follow the prompts.  For any non-urgent questions, you may also contact your provider using MyChart. We now offer e-Visits for anyone 14 and older to request care online for non-urgent symptoms. For details visit [email or web address].   Also download the MyChart app! Go to the app store, search "MyChart", open the app, select [facility], and log in with your MyChart username and password.

## 2023-03-30 NOTE — Progress Notes (Signed)
Patient presents today for Kristin Carson, Taxol and Cisplatin infusion per providers order.  Labs and vital signs reviewed by MD. Message received from Chapman Moss RN/Dr. Ellin Saba patient okay for treatment, Cisplatin being changed to Carboplatin.

## 2023-03-30 NOTE — Progress Notes (Signed)
Patient tolerated chemotherapy treatment well with no complaints voiced.  Patient left ambulatory in stable condition.  Vital signs stable at discharge.  Follow up as scheduled.

## 2023-03-30 NOTE — Patient Instructions (Signed)

## 2023-03-31 ENCOUNTER — Other Ambulatory Visit: Payer: Self-pay

## 2023-04-02 ENCOUNTER — Other Ambulatory Visit: Payer: Self-pay

## 2023-04-02 DIAGNOSIS — Z419 Encounter for procedure for purposes other than remedying health state, unspecified: Secondary | ICD-10-CM | POA: Diagnosis not present

## 2023-04-02 DIAGNOSIS — G893 Neoplasm related pain (acute) (chronic): Secondary | ICD-10-CM

## 2023-04-02 MED ORDER — HYDROCODONE-ACETAMINOPHEN 5-325 MG PO TABS
1.0000 | ORAL_TABLET | Freq: Three times a day (TID) | ORAL | 0 refills | Status: DC | PRN
Start: 2023-04-02 — End: 2023-04-27

## 2023-04-02 MED ORDER — HYDROXYZINE HCL 10 MG PO TABS: 10.0000 mg | ORAL_TABLET | Freq: Three times a day (TID) | ORAL | 0 refills | Status: DC | PRN

## 2023-04-03 ENCOUNTER — Other Ambulatory Visit: Payer: Self-pay

## 2023-04-03 ENCOUNTER — Telehealth: Payer: Self-pay | Admitting: Internal Medicine

## 2023-04-03 MED ORDER — AMLODIPINE BESYLATE 10 MG PO TABS: 10.0000 mg | ORAL_TABLET | Freq: Every day | ORAL | 2 refills | Status: DC

## 2023-04-03 NOTE — Telephone Encounter (Signed)
Prescription Request  04/03/2023  LOV: 11/18/2022  What is the name of the medication or equipment? mLODipine (NORVASC) 10 MG tablet [[id number]]   Have you contacted your pharmacy to request a refill? No   Which pharmacy would you like this sent to?  [facility] [address], [place] - [address] [place] [address] Phone: [phone] Fax: [phone]    Patient notified that their request is being sent to the clinical staff for review and that they should receive a response within 2 business days.   Please advise at Mobile [phone] (mobile)

## 2023-04-03 NOTE — Telephone Encounter (Signed)
Refills sent to pharmacy. 

## 2023-04-04 ENCOUNTER — Other Ambulatory Visit: Payer: Self-pay

## 2023-04-18 ENCOUNTER — Other Ambulatory Visit: Payer: Self-pay

## 2023-04-20 NOTE — Progress Notes (Signed)
[facility] [address] [place], [place] [address]    Clinic Day:  04/21/23    Referring physician: Billie Lade, MD  Patient Care Team: Billie Lade, MD as PCP - General (Internal Medicine) Doreatha Massed, MD as Medical Oncologist (Medical Oncology) Therese Sarah, RN as Oncology Nurse Navigator (Medical Oncology)   ASSESSMENT & PLAN:   Assessment: 1.  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 - CTAP (08/30/2022): Irregularity of the endometrium extending through the cervix with foci of gas in the cervix.  Multifocal omental and peritoneal metastasis.  Metastatic left periaortic, aortocaval, left external iliac and right common iliac lymph nodes.  Multiple bilateral lung nodules at bases.  12 mm hypodensity in the liver near the falciform ligament. - Endometrial biopsy (09/09/2022): Invasive moderately to poorly differentiated squamous cell carcinoma with abundant necrosis.  Cervix biopsy: Invasive and in situ moderate to poorly differentiated squamous cell carcinoma.  High risk HPV positive. - NGS: PD-L1 (22 C3): CPS 2, HER2 IHC negative.  PIK3CA exon 10 pathogenic variant.  MS-stable.  TMB-low.  FANCM and SDHA pathogenic variants. - PD-L1 CPS: 8% - Cycle 1 of cisplatin, paclitaxel, bevacizumab on 10/02/2022, pembrolizumab added with cycle 2 on 10/23/2022 - Bone scan (10/14/2022): No metastatic disease   2.  Social/family history: - She is married and lives at home with her husband and roommate.  She has never worked.  She is current active smoker, half pack per day, for the last 28 years.  She occasionally smokes marijuana. - Mother died of lung cancer.  Father had head and neck cancer.  Maternal grandmother had cancer.  Paternal uncle died of cancer.  Maternal uncle also had cancer.     Plan: 1.  Metastatic cervical cancer to the lungs, peritoneum and liver: - She has tolerated last cycle very well except neuropathic pains. - Labs today: Normal LFTs.  CBC grossly normal.  TSH is 5.325.  No immunotherapy related side effects. - Proceed with cycle 8 today with dose reduction of paclitaxel 200 mg/m. - Recommend CT scan prior to next visit in 3 weeks.  If it is better, will discontinue paclitaxel.   2.  Normocytic anemia: - She received Feraheme intermittently in the past.  Hemoglobin today is 9.9.  Partly from myelosuppression.   3.  Peripheral neuropathy: - She developed neuropathic pains in the feet and legs after cycle 6.  Cisplatin was discontinued.  She is taking gabapentin 600 mg 3 times daily. - She reported that gabapentin is not helping.  Even the pain medication is not helping. - I will switch her to Lyrica 100 mg 3 times daily and titrate up as tolerated. - I will decrease Taxol to 100 mg/m.  If the CT scan is better, we will discontinue Taxol.   4.  Hypertension: - Continue amlodipine 10 mg daily.        No orders of the defined types were placed in this encounter.    Alben Deeds Teague,acting as a Neurosurgeon for Doreatha Massed, MD.,have documented all relevant documentation on the behalf of Doreatha Massed, MD,as directed by  Doreatha Massed, MD while in the presence of Doreatha Massed, MD.  I, Doreatha Massed MD, have reviewed the above documentation for accuracy and completeness, and I agree with the above.     Doreatha Massed,  MD   8/20/20244:54 PM  CHIEF COMPLAINT:   Diagnosis: metastatic cervical squamous cell carcinoma    Cancer Staging  Cervical cancer [facility]) Staging form: Cervix Uteri, AJCC Version 9 - Clinical stage from 09/19/2022: FIGO Stage IVB (cT4, cN2a, cM1) - Unsigned    Prior Therapy: none  Current Therapy:  Cisplatin + Paclitaxel + Bevacizumab, Keytruda   HISTORY OF PRESENT ILLNESS:   Oncology  History Overview Note  PD-L1 CPS 8%   Cervical cancer (HCC)  09/19/2022 Initial Diagnosis   Cervical cancer (HCC)   10/02/2022 -  Chemotherapy   [name]        INTERVAL HISTORY:   Kristin Carson is a 52 y.o. female presenting to clinic today for follow up of metastatic cervical squamous cell carcinoma. She was last seen by me on 03/30/23.  Today, she states that she is doing well overall. Her appetite level is at 100%. Her energy level is at 50%. She is accompanied by her husband.   She notes a normal appetite, except for a period of 1 week of decreased appetite that has resolved. She c/o bilateral burning feet pain radiating to the lower legs that is an 8/10 severity. She reports gabapentin is not helping the pain in her feet, makes her slightly drowsy, and believes it worsens the pain.  Pain is off and on throughout the day and night and is worsened when walking. She notes hydrocodone is not improving pain either.   PAST MEDICAL HISTORY:   Past Medical History: Past Medical History:  Diagnosis Date   Anemia    Asthma    Bronchitis    Cancer (HCC)    Cervical   CHF (congestive heart failure) (HCC)    COPD (chronic obstructive pulmonary disease) (HCC)    Depression    GERD (gastroesophageal reflux disease)    Headache    Hypertension    Port-A-Cath in place 09/25/2022    Surgical History: Past Surgical History:  Procedure Laterality Date   BREAST SURGERY     CESAREAN SECTION     IR IMAGING GUIDED PORT INSERTION  09/30/2022   LEG SURGERY      Social History: Social History   Socioeconomic History   Marital status: Married    Spouse name: Not on file   Number of children: Not on file   Years of education: Not on file   Highest education level: Not on file  Occupational History   Not on file  Tobacco Use   Smoking status: Every Day    Current packs/day: 0.25     Average packs/day: 0.3 packs/day for 23.0 years (5.8 ttl pk-yrs)    Types: Cigarettes   Smokeless tobacco: Never  Vaping Use   Vaping status: Never Used  Substance and Sexual Activity   Alcohol use: Yes    Comment: occ   Drug use: Yes    Types: Marijuana   Sexual activity: Yes    Birth control/protection: None  Other Topics Concern   Not on file  Social History Narrative   Not on file   Social Determinants of Health   Financial Resource Strain: Low Risk  (09/09/2022)   Overall Financial Resource Strain (CARDIA)    Difficulty of Paying Living Expenses: Not hard at all  Food Insecurity: No Food Insecurity (09/22/2022)   Hunger Vital Sign    Worried About Running Out of Food in the  Last Year: Never true    Ran Out of Food in the Last Year: Never true  Transportation Needs: No Transportation Needs (09/22/2022)   PRAPARE - Administrator, Civil Service (Medical): No    Lack of Transportation (Non-Medical): No  Recent Concern: Transportation Needs - Unmet Transportation Needs (09/09/2022)   PRAPARE - Transportation    Lack of Transportation (Medical): Yes    Lack of Transportation (Non-Medical): Yes  Physical Activity: Insufficiently Active (09/09/2022)   Exercise Vital Sign    Days of Exercise per Week: 2 days    Minutes of Exercise per Session: 20 min  Stress: No Stress Concern Present (09/09/2022)   [employer] of Occupational Health - Occupational Stress Questionnaire    Feeling of Stress : Only a little  Social Connections: Socially Isolated (09/09/2022)   Social Connection and Isolation Panel [NHANES]    Frequency of Communication with Friends and Family: Three times a week    Frequency of Social Gatherings with Friends and Family: More than three times a week    Attends Religious Services: Never    Database administrator or Organizations: No    Attends Banker Meetings: Never    Marital Status: Separated  Intimate Partner Violence: Not At Risk  (09/22/2022)   Humiliation, Afraid, Rape, and Kick questionnaire    Fear of Current or Ex-Partner: No    Emotionally Abused: No    Physically Abused: No    Sexually Abused: No    Family History: Family History  Problem Relation Age of Onset   Dermatomyositis Mother    Hypertension Mother    Cancer Mother        lung   Cancer Father        lung   Heart disease Father    Lung cancer Paternal Uncle    Lung cancer Maternal Uncle    Colon cancer Neg Hx    Breast cancer Neg Hx    Ovarian cancer Neg Hx    Endometrial cancer Neg Hx    Pancreatic cancer Neg Hx    Prostate cancer Neg Hx     Current Medications:  Current Outpatient Medications:    albuterol (VENTOLIN HFA) 108 (90 Base) MCG/ACT inhaler, Inhale 2 puffs into the lungs every 6 (six) hours as needed for wheezing or shortness of breath., Disp: 8 g, Rfl: 2   amLODipine (NORVASC) 10 MG tablet, Take 1 tablet (10 mg total) by mouth daily., Disp: 30 tablet, Rfl: 2   Atezolizumab (TECENTRIQ IV), Inject into the vein every 21 ( twenty-one) days., Disp: , Rfl:    Bevacizumab (AVASTIN IV), Inject into the vein every 21 ( twenty-one) days., Disp: , Rfl:    calcium carbonate (TUMS) 500 MG chewable tablet, Chew 1 tablet (200 mg of elemental calcium total) by mouth 3 (three) times daily with meals., Disp: 60 tablet, Rfl: 3   CISPLATIN IV, Inject into the vein every 21 ( twenty-one) days., Disp: , Rfl:    DULoxetine (CYMBALTA) 60 MG capsule, Take 1 capsule (60 mg total) by mouth daily., Disp: 30 capsule, Rfl: 3   HYDROcodone-acetaminophen (NORCO/VICODIN) 5-325 MG tablet, Take 1 tablet by mouth every 8 (eight) hours as needed for moderate pain., Disp: 90 tablet, Rfl: 0   hydrOXYzine (ATARAX) 10 MG tablet, Take 1 tablet (10 mg total) by mouth 3 (three) times daily as needed for itching., Disp: 30 tablet, Rfl: 0   loperamide (IMODIUM) 2 MG capsule, Take 1 capsule (2 mg  total) by mouth as needed for diarrhea or loose stools (Take 2 capsules  after the first loose stool and 1 capsule after each loose stool thereafter.  DO NOT EXCEED 8 capsules a day.)., Disp: 30 capsule, Rfl: 0   LORazepam (ATIVAN) 0.5 MG tablet, Take one tablet by mouth three hours prior to CT scan; take the second tablet by mouth upon arrival to radiology for procedure, Disp: 2 tablet, Rfl: 0   megestrol (MEGACE) 400 MG/10ML suspension, Take 10 mLs (400 mg total) by mouth 2 (two) times daily., Disp: 480 mL, Rfl: 1   nicotine polacrilex (NICORETTE) 4 MG gum, Take 1 each (4 mg total) by mouth as needed for smoking cessation., Disp: 100 tablet, Rfl: 0   PACLITAXEL IV, Inject into the vein every 21 ( twenty-one) days., Disp: , Rfl:    pantoprazole (PROTONIX) 40 MG tablet, Take 1 tablet (40 mg total) by mouth daily., Disp: 90 tablet, Rfl: 1   predniSONE (DELTASONE) 5 MG tablet, Take 1 tablet (5 mg total) by mouth daily with breakfast., Disp: 30 tablet, Rfl: 2   pregabalin (LYRICA) 100 MG capsule, Take 1 capsule (100 mg total) by mouth 3 (three) times daily., Disp: 90 capsule, Rfl: 2   triamcinolone cream (KENALOG) 0.1 %, Apply 1 Application topically 2 (two) times daily., Disp: 30 g, Rfl: 0   lidocaine-prilocaine (EMLA) cream, Apply a dime-sized amount to port a cath site and cover with plastic wrap 1 hour prior to infusion appointments, Disp: 30 g, Rfl: 3   ondansetron (ZOFRAN) 8 MG tablet, Take 1 tablet (8 mg total) by mouth every 8 (eight) hours as needed for nausea or vomiting. Start on the third day after cisplatin., Disp: 30 tablet, Rfl: 1   prochlorperazine (COMPAZINE) 10 MG tablet, Take 1 tablet (10 mg total) by mouth every 6 (six) hours as needed (Nausea or vomiting)., Disp: 30 tablet, Rfl: 1 No current facility-administered medications for this visit.  Facility-Administered Medications Ordered in Other Visits:    sodium chloride flush (NS) 0.9 % injection 10 mL, 10 mL, Intracatheter, PRN, Doreatha Massed, MD, 10 mL at 04/21/23 1626   Allergies: Allergies   Allergen Reactions   Carrot [Daucus Carota] Hives   Lisinopril-Hydrochlorothiazide     Oral swelling   Ibuprofen Other (See Comments)    Oral swelling   Tramadol Hives   Erythromycin Hives    REVIEW OF SYSTEMS:   Review of Systems  Constitutional:  Negative for chills, fatigue and fever.  HENT:   Negative for lump/mass, mouth sores, nosebleeds, sore throat and trouble swallowing.   Eyes:  Negative for eye problems.  Respiratory:  Positive for cough and shortness of breath.   Cardiovascular:  Negative for chest pain, leg swelling and palpitations.  Gastrointestinal:  Negative for abdominal pain, constipation, diarrhea, nausea and vomiting.  Genitourinary:  Negative for bladder incontinence, difficulty urinating, dysuria, frequency, hematuria and nocturia.   Musculoskeletal:  Positive for arthralgias (in bilateral feet, 8/10 severity). Negative for back pain, flank pain, myalgias and neck pain.  Skin:  Negative for itching and rash.  Neurological:  Positive for numbness (in feet and hands). Negative for dizziness and headaches.  Hematological:  Does not bruise/bleed easily.  Psychiatric/Behavioral:  Negative for depression, sleep disturbance and suicidal ideas. The patient is not nervous/anxious.   All other systems reviewed and are negative.    VITALS:   Blood pressure (!) 135/90, pulse (!) 115, temperature 98.5 F (36.9 C), temperature source Oral, resp. rate 18, weight 174  lb 6.4 oz (79.1 kg), last menstrual period 08/15/2022, SpO2 100%.  Wt Readings from Last 3 Encounters:  04/21/23 174 lb 6.4 oz (79.1 kg)  03/30/23 176 lb (79.8 kg)  03/09/23 173 lb 12.8 oz (78.8 kg)    Body mass index is 28.15 kg/m.  Performance status (ECOG): 1 - Symptomatic but completely ambulatory  PHYSICAL EXAM:   Physical Exam Vitals and nursing note reviewed. Exam conducted with a chaperone present.  Constitutional:      Appearance: Normal appearance.  Cardiovascular:     Rate and Rhythm:  Normal rate and regular rhythm.     Pulses: Normal pulses.     Heart sounds: Normal heart sounds.  Pulmonary:     Effort: Pulmonary effort is normal.     Breath sounds: Normal breath sounds.  Abdominal:     Palpations: Abdomen is soft. There is no hepatomegaly, splenomegaly or mass.     Tenderness: There is no abdominal tenderness.  Musculoskeletal:     Right lower leg: No edema.     Left lower leg: No edema.  Lymphadenopathy:     Cervical: No cervical adenopathy.     Right cervical: No superficial, deep or posterior cervical adenopathy.    Left cervical: No superficial, deep or posterior cervical adenopathy.     Upper Body:     Right upper body: No supraclavicular or axillary adenopathy.     Left upper body: No supraclavicular or axillary adenopathy.  Neurological:     General: No focal deficit present.     Mental Status: She is alert and oriented to person, place, and time.  Psychiatric:        Mood and Affect: Mood normal.        Behavior: Behavior normal.     LABS:      Latest Ref Rng & Units 04/21/2023    8:42 AM 03/30/2023    7:50 AM 03/09/2023    7:46 AM  CBC  WBC 4.0 - 10.5 K/uL 5.6  5.1  6.8   Hemoglobin 12.0 - 15.0 g/dL 9.9  42.5  95.6   Hematocrit 36.0 - 46.0 % 29.9  30.7  37.1   Platelets 150 - 400 K/uL 166  311  193       Latest Ref Rng & Units 04/21/2023    8:42 AM 03/30/2023    7:50 AM 03/09/2023    7:46 AM  CMP  Glucose 70 - 99 mg/dL 387  92  91   BUN 6 - 20 mg/dL 8  9  7    Creatinine 0.44 - 1.00 mg/dL 5.64  3.32  9.51   Sodium 135 - 145 mmol/L 135  135  135   Potassium 3.5 - 5.1 mmol/L 3.8  3.7  3.3   Chloride 98 - 111 mmol/L 104  104  102   CO2 22 - 32 mmol/L 24  25  21    Calcium 8.9 - 10.3 mg/dL 8.7  8.3  8.7   Total Protein 6.5 - 8.1 g/dL 7.1  6.6  8.0   Total Bilirubin 0.3 - 1.2 mg/dL 0.4  0.3  0.5   Alkaline Phos 38 - 126 U/L 58  57  54   AST 15 - 41 U/L 19  17  27    ALT 0 - 44 U/L 12  12  14       No results found for: "CEA1", "CEA" / No  results found for: "CEA1", "CEA" No results found for: "PSA1" No results found for: "  [id number]" No results found for: "CAN125"  No results found for: "TOTALPROTELP", "ALBUMINELP", "A1GS", "A2GS", "BETS", "BETA2SER", "GAMS", "MSPIKE", "SPEI" Lab Results  Component Value Date   TIBC 412 09/22/2022   FERRITIN 35 09/22/2022   IRONPCTSAT 7 (L) 09/22/2022   No results found for: "LDH"   STUDIES:   No results found.

## 2023-04-21 ENCOUNTER — Inpatient Hospital Stay: Payer: Medicaid Other

## 2023-04-21 ENCOUNTER — Inpatient Hospital Stay: Payer: Medicaid Other | Attending: Hematology | Admitting: Hematology

## 2023-04-21 VITALS — BP 140/81 | HR 92 | Temp 97.3°F | Resp 18

## 2023-04-21 DIAGNOSIS — C786 Secondary malignant neoplasm of retroperitoneum and peritoneum: Secondary | ICD-10-CM | POA: Insufficient documentation

## 2023-04-21 DIAGNOSIS — F17210 Nicotine dependence, cigarettes, uncomplicated: Secondary | ICD-10-CM | POA: Insufficient documentation

## 2023-04-21 DIAGNOSIS — I10 Essential (primary) hypertension: Secondary | ICD-10-CM | POA: Diagnosis not present

## 2023-04-21 DIAGNOSIS — Z801 Family history of malignant neoplasm of trachea, bronchus and lung: Secondary | ICD-10-CM | POA: Insufficient documentation

## 2023-04-21 DIAGNOSIS — Z7689 Persons encountering health services in other specified circumstances: Secondary | ICD-10-CM | POA: Diagnosis not present

## 2023-04-21 DIAGNOSIS — C787 Secondary malignant neoplasm of liver and intrahepatic bile duct: Secondary | ICD-10-CM | POA: Insufficient documentation

## 2023-04-21 DIAGNOSIS — Z95828 Presence of other vascular implants and grafts: Secondary | ICD-10-CM

## 2023-04-21 DIAGNOSIS — C538 Malignant neoplasm of overlapping sites of cervix uteri: Secondary | ICD-10-CM

## 2023-04-21 DIAGNOSIS — C539 Malignant neoplasm of cervix uteri, unspecified: Secondary | ICD-10-CM | POA: Insufficient documentation

## 2023-04-21 DIAGNOSIS — C7802 Secondary malignant neoplasm of left lung: Secondary | ICD-10-CM | POA: Diagnosis not present

## 2023-04-21 DIAGNOSIS — Z5112 Encounter for antineoplastic immunotherapy: Secondary | ICD-10-CM | POA: Insufficient documentation

## 2023-04-21 DIAGNOSIS — D649 Anemia, unspecified: Secondary | ICD-10-CM | POA: Diagnosis not present

## 2023-04-21 DIAGNOSIS — Z5111 Encounter for antineoplastic chemotherapy: Secondary | ICD-10-CM | POA: Diagnosis not present

## 2023-04-21 DIAGNOSIS — C7801 Secondary malignant neoplasm of right lung: Secondary | ICD-10-CM | POA: Insufficient documentation

## 2023-04-21 DIAGNOSIS — G629 Polyneuropathy, unspecified: Secondary | ICD-10-CM | POA: Insufficient documentation

## 2023-04-21 LAB — MAGNESIUM: Magnesium: 1.5 mg/dL — ABNORMAL LOW (ref 1.7–2.4)

## 2023-04-21 LAB — COMPREHENSIVE METABOLIC PANEL
ALT: 12 U/L (ref 0–44)
AST: 19 U/L (ref 15–41)
Albumin: 3.1 g/dL — ABNORMAL LOW (ref 3.5–5.0)
Alkaline Phosphatase: 58 U/L (ref 38–126)
Anion gap: 7 (ref 5–15)
BUN: 8 mg/dL (ref 6–20)
CO2: 24 mmol/L (ref 22–32)
Calcium: 8.7 mg/dL — ABNORMAL LOW (ref 8.9–10.3)
Chloride: 104 mmol/L (ref 98–111)
Creatinine, Ser: 0.81 mg/dL (ref 0.44–1.00)
GFR, Estimated: >60 mL/min (ref >60)
Glucose, Bld: 106 mg/dL — ABNORMAL HIGH (ref 70–99)
Potassium: 3.8 mmol/L (ref 3.5–5.1)
Sodium: 135 mmol/L (ref 135–145)
Total Bilirubin: 0.4 mg/dL (ref 0.3–1.2)
Total Protein: 7.1 g/dL (ref 6.5–8.1)

## 2023-04-21 LAB — CBC WITH DIFFERENTIAL/PLATELET
Abs Immature Granulocytes: 0.01 10*3/uL (ref 0.00–0.07)
Basophils Absolute: 0 10*3/uL (ref 0.0–0.1)
Basophils Relative: 1 %
Eosinophils Absolute: 0.1 10*3/uL (ref 0.0–0.5)
Eosinophils Relative: 1 %
HCT: 29.9 % — ABNORMAL LOW (ref 36.0–46.0)
Hemoglobin: 9.9 g/dL — ABNORMAL LOW (ref 12.0–15.0)
Immature Granulocytes: 0 %
Lymphocytes Relative: 38 %
Lymphs Abs: 2.1 10*3/uL (ref 0.7–4.0)
MCH: 32.5 pg (ref 26.0–34.0)
MCHC: 33.1 g/dL (ref 30.0–36.0)
MCV: 98 fL (ref 80.0–100.0)
Monocytes Absolute: 0.7 10*3/uL (ref 0.1–1.0)
Monocytes Relative: 13 %
Neutro Abs: 2.6 10*3/uL (ref 1.7–7.7)
Neutrophils Relative %: 47 %
Platelets: 166 10*3/uL (ref 150–400)
RBC: 3.05 MIL/uL — ABNORMAL LOW (ref 3.87–5.11)
RDW: 15.1 % (ref 11.5–15.5)
WBC: 5.6 10*3/uL (ref 4.0–10.5)
nRBC: 0 % (ref 0.0–0.2)

## 2023-04-21 MED ORDER — PALONOSETRON HCL INJECTION 0.25 MG/5ML
0.2500 mg | Freq: Once | INTRAVENOUS | Status: AC
  Administered 2023-04-21: 0.25 mg via INTRAVENOUS
  Filled 2023-04-21: qty 5

## 2023-04-21 MED ORDER — FAMOTIDINE IN NACL 20-0.9 MG/50ML-% IV SOLN
20.0000 mg | Freq: Once | INTRAVENOUS | Status: AC
  Administered 2023-04-21: 20 mg via INTRAVENOUS
  Filled 2023-04-21: qty 50

## 2023-04-21 MED ORDER — SODIUM CHLORIDE 0.9 % IV SOLN
200.0000 mg | Freq: Once | INTRAVENOUS | Status: AC
  Administered 2023-04-21: 200 mg via INTRAVENOUS
  Filled 2023-04-21: qty 8

## 2023-04-21 MED ORDER — SODIUM CHLORIDE 0.9 % IV SOLN
640.0000 mg | Freq: Once | INTRAVENOUS | Status: AC
  Administered 2023-04-21: 640 mg via INTRAVENOUS
  Filled 2023-04-21: qty 64

## 2023-04-21 MED ORDER — SODIUM CHLORIDE 0.9 % IV SOLN
10.0000 mg | Freq: Once | INTRAVENOUS | Status: AC
  Administered 2023-04-21: 10 mg via INTRAVENOUS
  Filled 2023-04-21: qty 10

## 2023-04-21 MED ORDER — SODIUM CHLORIDE 0.9 % IV SOLN
15.0000 mg/kg | Freq: Once | INTRAVENOUS | Status: AC
  Administered 2023-04-21: 1300 mg via INTRAVENOUS
  Filled 2023-04-21: qty 52

## 2023-04-21 MED ORDER — PREGABALIN 100 MG PO CAPS: 100.0000 mg | ORAL_CAPSULE | Freq: Three times a day (TID) | ORAL | 2 refills | Status: DC

## 2023-04-21 MED ORDER — CETIRIZINE HCL 10 MG/ML IV SOLN
10.0000 mg | Freq: Once | INTRAVENOUS | Status: AC
  Administered 2023-04-21: 10 mg via INTRAVENOUS
  Filled 2023-04-21: qty 1

## 2023-04-21 MED ORDER — SODIUM CHLORIDE 0.9 % IV SOLN: Freq: Once | INTRAVENOUS | Status: AC

## 2023-04-21 MED ORDER — SODIUM CHLORIDE 0.9% FLUSH
10.0000 mL | INTRAVENOUS | Status: DC | PRN
  Administered 2023-04-21: 10 mL

## 2023-04-21 MED ORDER — HEPARIN SOD (PORK) LOCK FLUSH 100 UNIT/ML IV SOLN
500.0000 [IU] | Freq: Once | INTRAVENOUS | Status: AC | PRN
  Administered 2023-04-21: 500 [IU]

## 2023-04-21 MED ORDER — SODIUM CHLORIDE 0.9 % IV SOLN
100.0000 mg/m2 | Freq: Once | INTRAVENOUS | Status: AC
  Administered 2023-04-21: 204 mg via INTRAVENOUS
  Filled 2023-04-21: qty 34

## 2023-04-21 MED ORDER — MAGNESIUM SULFATE 2 GM/50ML IV SOLN
2.0000 g | Freq: Once | INTRAVENOUS | Status: AC
  Administered 2023-04-21: 2 g via INTRAVENOUS
  Filled 2023-04-21: qty 50

## 2023-04-21 MED ORDER — HYDROCODONE-ACETAMINOPHEN 5-325 MG PO TABS
1.0000 | ORAL_TABLET | Freq: Once | ORAL | Status: AC
  Administered 2023-04-21: 1 via ORAL
  Filled 2023-04-21: qty 1

## 2023-04-21 MED ORDER — SODIUM CHLORIDE 0.9 % IV SOLN
150.0000 mg | Freq: Once | INTRAVENOUS | Status: AC
  Administered 2023-04-21: 150 mg via INTRAVENOUS
  Filled 2023-04-21: qty 150

## 2023-04-21 NOTE — Patient Instructions (Signed)

## 2023-04-21 NOTE — Progress Notes (Signed)
Patient tolerated treatment well with no complaints voiced.  Patient left ambulatory in stable condition.  Vital signs stable at discharge.  Follow up as scheduled.

## 2023-04-21 NOTE — Progress Notes (Signed)
Patient has been examined by Dr. Ellin Saba. Vital signs and labs have been reviewed by MD - ANC, Creatinine, LFTs, hemoglobin, and platelets are within treatment parameters per M.D. - pt may proceed with treatment. Taxol dose-reduced to 100 mg/m2. Primary RN and pharmacy notified.

## 2023-04-21 NOTE — Patient Instructions (Signed)

## 2023-04-21 NOTE — Progress Notes (Signed)
Patient presents today for chemotherapy infusion. Patient is in satisfactory condition with no new complaints voiced.  Vital signs are stable.  Labs reviewed by Dr. Ellin Saba during the office visit and all labs are within treatment parameters. We will continue to hold cisplatin due neuropathy, Carboplatin to be given instead. Taxol will be dose-reduced to 100 mg/m2 per Dr.K. Pt's magnesium is 1.5, pt will receive magnesium sulfate per Dr.K's standing orders. We will proceed with treatment per MD orders.

## 2023-04-21 NOTE — Progress Notes (Signed)
Per MD Give carboplatin dose today at 640 mg  V.O. Dr Carilyn Goodpasture, PharmD

## 2023-04-27 ENCOUNTER — Other Ambulatory Visit: Payer: Self-pay | Admitting: *Deleted

## 2023-04-27 ENCOUNTER — Other Ambulatory Visit (HOSPITAL_COMMUNITY): Payer: Self-pay | Admitting: Hematology

## 2023-04-27 DIAGNOSIS — G893 Neoplasm related pain (acute) (chronic): Secondary | ICD-10-CM

## 2023-04-27 MED ORDER — HYDROCODONE-ACETAMINOPHEN 5-325 MG PO TABS: 1.0000 | ORAL_TABLET | Freq: Four times a day (QID) | ORAL | 0 refills | Status: DC | PRN

## 2023-04-28 ENCOUNTER — Other Ambulatory Visit: Payer: Self-pay | Admitting: *Deleted

## 2023-04-28 DIAGNOSIS — G622 Polyneuropathy due to other toxic agents: Secondary | ICD-10-CM

## 2023-04-28 MED ORDER — PREGABALIN 150 MG PO CAPS
150.0000 mg | ORAL_CAPSULE | Freq: Three times a day (TID) | ORAL | 0 refills | Status: AC
Start: 2023-04-28 — End: ?

## 2023-04-28 MED ORDER — PREGABALIN 150 MG PO CAPS: 150.0000 mg | ORAL_CAPSULE | Freq: Three times a day (TID) | ORAL | 0 refills | Status: DC

## 2023-04-30 ENCOUNTER — Other Ambulatory Visit: Payer: Self-pay | Admitting: *Deleted

## 2023-04-30 ENCOUNTER — Telehealth: Payer: Self-pay | Admitting: *Deleted

## 2023-04-30 DIAGNOSIS — G622 Polyneuropathy due to other toxic agents: Secondary | ICD-10-CM

## 2023-04-30 MED ORDER — PREGABALIN 200 MG PO CAPS: 200.0000 mg | ORAL_CAPSULE | Freq: Three times a day (TID) | ORAL | 0 refills | Status: DC

## 2023-04-30 NOTE — Telephone Encounter (Signed)
Patient called again stating that the lyrica prescribed was not helping her leg pain.  Her dose was increased to 150 mg 3 times a day yesterday, however she did not increase her dose.  Per Dr. Ellin Saba, she may increase to 200 mg 3 times a day.  Patient made aware and verbalized understanding.

## 2023-05-03 DIAGNOSIS — Z419 Encounter for procedure for purposes other than remedying health state, unspecified: Secondary | ICD-10-CM | POA: Diagnosis not present

## 2023-05-06 ENCOUNTER — Other Ambulatory Visit: Payer: Self-pay

## 2023-05-06 ENCOUNTER — Ambulatory Visit (HOSPITAL_COMMUNITY)
Admission: RE | Admit: 2023-05-06 | Discharge: 2023-05-06 | Disposition: A | Discharge status: Home / Self Care | Payer: Medicaid Other | Source: Ambulatory Visit | Attending: Hematology | Admitting: Hematology

## 2023-05-06 DIAGNOSIS — C538 Malignant neoplasm of overlapping sites of cervix uteri: Secondary | ICD-10-CM

## 2023-05-06 DIAGNOSIS — Z7689 Persons encountering health services in other specified circumstances: Secondary | ICD-10-CM | POA: Diagnosis not present

## 2023-05-08 ENCOUNTER — Ambulatory Visit (HOSPITAL_COMMUNITY)
Admission: RE | Admit: 2023-05-08 | Discharge: 2023-05-08 | Disposition: A | Discharge status: Home / Self Care | Payer: Medicaid Other | Source: Ambulatory Visit | Attending: Hematology | Admitting: Hematology

## 2023-05-08 DIAGNOSIS — Z7689 Persons encountering health services in other specified circumstances: Secondary | ICD-10-CM | POA: Diagnosis not present

## 2023-05-08 DIAGNOSIS — C538 Malignant neoplasm of overlapping sites of cervix uteri: Secondary | ICD-10-CM | POA: Diagnosis not present

## 2023-05-08 DIAGNOSIS — I700 Atherosclerosis of aorta: Secondary | ICD-10-CM | POA: Diagnosis not present

## 2023-05-08 DIAGNOSIS — C539 Malignant neoplasm of cervix uteri, unspecified: Secondary | ICD-10-CM | POA: Diagnosis not present

## 2023-05-08 DIAGNOSIS — N3289 Other specified disorders of bladder: Secondary | ICD-10-CM | POA: Diagnosis not present

## 2023-05-08 DIAGNOSIS — J439 Emphysema, unspecified: Secondary | ICD-10-CM | POA: Diagnosis not present

## 2023-05-08 MED ORDER — IOHEXOL 300 MG/ML  SOLN
100.0000 mL | Freq: Once | INTRAMUSCULAR | Status: AC | PRN
  Administered 2023-05-08: 100 mL via INTRAVENOUS

## 2023-05-08 MED ORDER — IOHEXOL 9 MG/ML PO SOLN
ORAL | Status: AC
  Filled 2023-05-08: qty 1000

## 2023-05-11 ENCOUNTER — Inpatient Hospital Stay: Payer: Medicaid Other

## 2023-05-11 ENCOUNTER — Inpatient Hospital Stay: Payer: Medicaid Other | Admitting: Hematology

## 2023-05-11 NOTE — Progress Notes (Signed)
Kristin Carson [address] [place] [address]    Clinic Day:  05/12/23     Referring physician: Billie Lade, MD  Patient Care Team: Kristin Lade, MD as PCP - General (Internal Medicine) Kristin Massed, MD as Medical Oncologist (Medical Oncology) Kristin Sarah, RN as Oncology Nurse Navigator (Medical Oncology)   ASSESSMENT & PLAN:   Assessment: 1.  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 - CTAP (08/30/2022): Irregularity of the endometrium extending through the cervix with foci of gas in the cervix.  Multifocal omental and peritoneal metastasis.  Metastatic left periaortic, aortocaval, left external iliac and right common iliac lymph nodes.  Multiple bilateral lung nodules at bases.  12 mm hypodensity in the liver near the falciform ligament. - Endometrial biopsy (09/09/2022): Invasive moderately to poorly differentiated squamous cell carcinoma with abundant necrosis.  Cervix biopsy: Invasive and in situ moderate to poorly differentiated squamous cell carcinoma.  High risk HPV positive. - NGS: PD-L1 (22 C3): CPS 2, HER2 IHC negative.  PIK3CA exon 10 pathogenic variant.  MS-stable.  TMB-low.  FANCM and SDHA pathogenic variants. - PD-L1 CPS: 8% - Cycle 1 of cisplatin, paclitaxel, bevacizumab on 10/02/2022, pembrolizumab added with cycle 2 on 10/23/2022 - Bone scan (10/14/2022): No metastatic disease   2.  Social/family history: - She is married and lives at home with her husband and roommate.  She has never worked.  She is current active smoker, half pack per day, for the last 28 years.  She occasionally smokes marijuana. - Mother died of lung cancer.  Father had head and neck cancer.  Maternal grandmother had cancer.  Paternal uncle died of cancer.  Maternal uncle also had cancer.     Plan: 1.  Metastatic cervical cancer to the lungs, peritoneum and liver: - She has tolerated last cycle reasonably well except for neuropathic pains in the legs. - Reviewed CT CAP from 05/08/2023: Decrease size of retroperitoneal lymph nodes, now subcentimeter.  No persistent peritoneal nodularity.  Small lung nodules diminished/completely resolved. - Reviewed labs today: Normal LFTs and creatinine.  CBC shows leukopenia with ANC 1.0 and thrombocytopenia 65. - Will hold her treatment today. - Will check CBC with differential next week.  Will consider resuming treatment next week if her ANC more than 1500 and platelet count more than 75K. - Will discontinue paclitaxel due to neuropathy.  She will receive carboplatin, bevacizumab and pembrolizumab.  RTC 4 weeks for follow-up.   2.  Normocytic anemia: - She has received Feraheme intermittently in the past.  This is from myelosuppression.  Hemoglobin today 7.8.  Will closely monitor.  Will consider transfusion if hemoglobin drops below 7.   3.  Peripheral neuropathy: - She has neuropathic pains in the feet and legs. - Continue gabapentin 600 mg 3 times daily. - Continue hydrocodone 5/325 every 6 hours as needed. - We will discontinue paclitaxel.   4.  Hypertension: - Continue amlodipine 10 mg daily.        No orders of the defined types were placed in this encounter.    Alben Deeds Teague,acting as a Neurosurgeon for Kristin Massed, MD.,have documented all relevant documentation on the behalf of Kristin Massed, MD,as directed by  Kristin Massed, MD while in the presence of Kristin Massed, MD.  I, Kristin Massed MD, have reviewed the  above documentation for accuracy and completeness, and I agree with the above.      Kristin Massed, MD   9/10/20245:37 PM  CHIEF COMPLAINT:   Diagnosis: metastatic cervical squamous cell carcinoma    Cancer Staging  Cervical cancer [facility]) Staging form: Cervix Uteri, AJCC  Version 9 - Clinical stage from 09/19/2022: FIGO Stage IVB (cT4, cN2a, cM1) - Unsigned    Prior Therapy: none  Current Therapy:  Cisplatin + Paclitaxel + Bevacizumab, Keytruda   HISTORY OF PRESENT ILLNESS:   Oncology History Overview Note  PD-L1 CPS 8%   Cervical cancer (HCC)  09/19/2022 Initial Diagnosis   Cervical cancer (HCC)   10/02/2022 -  Chemotherapy   [name]        INTERVAL HISTORY:   Bobbe is a 52 y.o. female presenting to clinic today for follow up of metastatic cervical squamous cell carcinoma. She was last seen by me on 04/21/23.  Since her last visit, she underwent CT C/A/P on 05/08/23 that found: diminished size of retroperitoneal lymph nodes, now significantly subcentimeter with no persistently enlarged lymph nodes; no persistent peritoneal nodularity; multiple previously seen small pulmonary nodules are diminished or completely resolved; a new small nodule of the anterior medial segment right middle lobe measuring 0.4 cm, nonspecific; background of emphysema and very fine centrilobular nodularity, most concentrated in the lung apices, consistent with smoking related respiratory bronchiolitis; and unchanged, thickened, decompressed urinary bladder.  Today, she states that she is doing well overall. Her appetite level is at 80%. Her energy level is at 80%. She is accompanied by her husband.   She c/o pain in the bilateral feet and legs that she treats with hydrocodone. She is currently out of hydrocodone and takes them every 4-8 hours. She was unaware she had a refill that was ordered on 04/27/23. She is taking her blood pressure medication and needs a refill of it.   PAST MEDICAL HISTORY:   Past Medical History: Past Medical History:  Diagnosis Date   Anemia    Asthma    Bronchitis    Cancer (HCC)    Cervical   CHF (congestive heart failure) (HCC)     COPD (chronic obstructive pulmonary disease) (HCC)    Depression    GERD (gastroesophageal reflux disease)    Headache    Hypertension    Port-A-Cath in place 09/25/2022    Surgical History: Past Surgical History:  Procedure Laterality Date   BREAST SURGERY     CESAREAN SECTION     IR IMAGING GUIDED PORT INSERTION  09/30/2022   LEG SURGERY      Social History: Social History   Socioeconomic History   Marital status: Married    Spouse name: Not on file   Number of children: Not on file   Years of education: Not on file   Highest education level: Not on file  Occupational History   Not on file  Tobacco Use   Smoking status: Every Day    Current packs/day: 0.25    Average packs/day: 0.3 packs/day for 23.0 years (5.8 ttl pk-yrs)    Types: Cigarettes   Smokeless tobacco: Never  Vaping Use   Vaping status: Never Used  Substance and Sexual Activity   Alcohol use: Yes    Comment: occ   Drug use: Yes    Types: Marijuana   Sexual activity: Yes    Birth control/protection:  None  Other Topics Concern   Not on file  Social History Narrative   Not on file   Social Determinants of Health   Financial Resource Strain: Low Risk  (09/09/2022)   Overall Financial Resource Strain (CARDIA)    Difficulty of Paying Living Expenses: Not hard at all  Food Insecurity: No Food Insecurity (09/22/2022)   Hunger Vital Sign    Worried About Running Out of Food in the Last Year: Never true    Ran Out of Food in the Last Year: Never true  Transportation Needs: No Transportation Needs (09/22/2022)   PRAPARE - Administrator, Civil Service (Medical): No    Lack of Transportation (Non-Medical): No  Recent Concern: Transportation Needs - Unmet Transportation Needs (09/09/2022)   PRAPARE - Transportation    Lack of Transportation (Medical): Yes    Lack of Transportation (Non-Medical): Yes  Physical Activity: Insufficiently Active (09/09/2022)   Exercise Vital Sign    Days of Exercise  per Week: 2 days    Minutes of Exercise per Session: 20 min  Stress: No Stress Concern Present (09/09/2022)   [employer] of Occupational Health - Occupational Stress Questionnaire    Feeling of Stress : Only a little  Social Connections: Socially Isolated (09/09/2022)   Social Connection and Isolation Panel [NHANES]    Frequency of Communication with Friends and Family: Three times a week    Frequency of Social Gatherings with Friends and Family: More than three times a week    Attends Religious Services: Never    Database administrator or Organizations: No    Attends Banker Meetings: Never    Marital Status: Separated  Intimate Partner Violence: Not At Risk (09/22/2022)   Humiliation, Afraid, Rape, and Kick questionnaire    Fear of Current or Ex-Partner: No    Emotionally Abused: No    Physically Abused: No    Sexually Abused: No    Family History: Family History  Problem Relation Age of Onset   Dermatomyositis Mother    Hypertension Mother    Cancer Mother        lung   Cancer Father        lung   Heart disease Father    Lung cancer Paternal Uncle    Lung cancer Maternal Uncle    Colon cancer Neg Hx    Breast cancer Neg Hx    Ovarian cancer Neg Hx    Endometrial cancer Neg Hx    Pancreatic cancer Neg Hx    Prostate cancer Neg Hx     Current Medications:  Current Outpatient Medications:    albuterol (VENTOLIN HFA) 108 (90 Base) MCG/ACT inhaler, Inhale 2 puffs into the lungs every 6 (six) hours as needed for wheezing or shortness of breath., Disp: 8 g, Rfl: 2   amLODipine (NORVASC) 10 MG tablet, Take 1 tablet (10 mg total) by mouth daily., Disp: 30 tablet, Rfl: 2   Atezolizumab (TECENTRIQ IV), Inject into the vein every 21 ( twenty-one) days., Disp: , Rfl:    Bevacizumab (AVASTIN IV), Inject into the vein every 21 ( twenty-one) days., Disp: , Rfl:    calcium carbonate (TUMS) 500 MG chewable tablet, Chew 1 tablet (200 mg of elemental calcium total) by  mouth 3 (three) times daily with meals., Disp: 60 tablet, Rfl: 3   DULoxetine (CYMBALTA) 60 MG capsule, Take 1 capsule (60 mg total) by mouth daily., Disp: 30 capsule, Rfl: 3   HYDROcodone-acetaminophen (NORCO/VICODIN) 5-325 MG  tablet, Take 1 tablet by mouth every 6 (six) hours as needed for moderate pain., Disp: 120 tablet, Rfl: 0   hydrOXYzine (ATARAX) 10 MG tablet, Take 1 tablet (10 mg total) by mouth 3 (three) times daily as needed for itching., Disp: 30 tablet, Rfl: 0   lidocaine-prilocaine (EMLA) cream, Apply a dime-sized amount to port a cath site and cover with plastic wrap 1 hour prior to infusion appointments, Disp: 30 g, Rfl: 3   loperamide (IMODIUM) 2 MG capsule, Take 1 capsule (2 mg total) by mouth as needed for diarrhea or loose stools (Take 2 capsules after the first loose stool and 1 capsule after each loose stool thereafter.  DO NOT EXCEED 8 capsules a day.)., Disp: 30 capsule, Rfl: 0   LORazepam (ATIVAN) 0.5 MG tablet, Take one tablet by mouth three hours prior to CT scan; take the second tablet by mouth upon arrival to radiology for procedure, Disp: 2 tablet, Rfl: 0   megestrol (MEGACE) 400 MG/10ML suspension, Take 10 mLs (400 mg total) by mouth 2 (two) times daily., Disp: 480 mL, Rfl: 1   nicotine polacrilex (NICORETTE) 4 MG gum, Take 1 each (4 mg total) by mouth as needed for smoking cessation., Disp: 100 tablet, Rfl: 0   ondansetron (ZOFRAN) 8 MG tablet, Take 1 tablet (8 mg total) by mouth every 8 (eight) hours as needed for nausea or vomiting. Start on the third day after cisplatin., Disp: 30 tablet, Rfl: 1   PACLITAXEL IV, Inject into the vein every 21 ( twenty-one) days., Disp: , Rfl:    pantoprazole (PROTONIX) 40 MG tablet, Take 1 tablet (40 mg total) by mouth daily., Disp: 90 tablet, Rfl: 1   predniSONE (DELTASONE) 5 MG tablet, Take 1 tablet (5 mg total) by mouth daily with breakfast., Disp: 30 tablet, Rfl: 2   pregabalin (LYRICA) 200 MG capsule, Take 1 capsule (200 mg total)  by mouth 3 (three) times daily., Disp: 180 capsule, Rfl: 0   prochlorperazine (COMPAZINE) 10 MG tablet, Take 1 tablet (10 mg total) by mouth every 6 (six) hours as needed (Nausea or vomiting)., Disp: 30 tablet, Rfl: 1   triamcinolone cream (KENALOG) 0.1 %, Apply 1 Application topically 2 (two) times daily., Disp: 30 g, Rfl: 0   Allergies: Allergies  Allergen Reactions   Carrot [Daucus Carota] Hives   Lisinopril-Hydrochlorothiazide     Oral swelling   Ibuprofen Other (See Comments)    Oral swelling   Tramadol Hives   Erythromycin Hives    REVIEW OF SYSTEMS:   Review of Systems  Constitutional:  Negative for chills, fatigue and fever.  HENT:   Negative for lump/mass, mouth sores, nosebleeds, sore throat and trouble swallowing.   Eyes:  Negative for eye problems.  Respiratory:  Positive for shortness of breath. Negative for cough.   Cardiovascular:  Negative for chest pain, leg swelling and palpitations.  Gastrointestinal:  Positive for diarrhea and nausea. Negative for abdominal pain, constipation and vomiting.  Genitourinary:  Negative for bladder incontinence, difficulty urinating, dysuria, frequency, hematuria and nocturia.   Musculoskeletal:  Positive for arthralgias (in the feet, 7/10 severity). Negative for back pain, flank pain, myalgias and neck pain.  Skin:  Negative for itching and rash.  Neurological:  Positive for numbness. Negative for dizziness and headaches.  Hematological:  Does not bruise/bleed easily.  Psychiatric/Behavioral:  Negative for depression, sleep disturbance and suicidal ideas. The patient is not nervous/anxious.   All other systems reviewed and are negative.    VITALS:  Blood pressure (!) 160/85, pulse (!) 112, temperature 98.4 F (36.9 C), temperature source Tympanic, resp. rate 20, weight 171 lb 12.8 oz (77.9 kg), last menstrual period 08/15/2022, SpO2 100%.  Wt Readings from Last 3 Encounters:  05/12/23 171 lb 12.8 oz (77.9 kg)  04/21/23 174 lb  6.4 oz (79.1 kg)  03/30/23 176 lb (79.8 kg)    Body mass index is 27.73 kg/m.  Performance status (ECOG): 1 - Symptomatic but completely ambulatory  PHYSICAL EXAM:   Physical Exam Vitals and nursing note reviewed. Exam conducted with a chaperone present.  Constitutional:      Appearance: Normal appearance.  Cardiovascular:     Rate and Rhythm: Normal rate and regular rhythm.     Pulses: Normal pulses.     Heart sounds: Normal heart sounds.  Pulmonary:     Effort: Pulmonary effort is normal.     Breath sounds: Normal breath sounds.  Abdominal:     Palpations: Abdomen is soft. There is no hepatomegaly, splenomegaly or mass.     Tenderness: There is no abdominal tenderness.  Musculoskeletal:     Right lower leg: No edema.     Left lower leg: No edema.  Lymphadenopathy:     Cervical: No cervical adenopathy.     Right cervical: No superficial, deep or posterior cervical adenopathy.    Left cervical: No superficial, deep or posterior cervical adenopathy.     Upper Body:     Right upper body: No supraclavicular or axillary adenopathy.     Left upper body: No supraclavicular or axillary adenopathy.  Neurological:     General: No focal deficit present.     Mental Status: She is alert and oriented to person, place, and time.  Psychiatric:        Mood and Affect: Mood normal.        Behavior: Behavior normal.     LABS:      Latest Ref Rng & Units 05/12/2023    8:23 AM 04/21/2023    8:42 AM 03/30/2023    7:50 AM  CBC  WBC 4.0 - 10.5 K/uL 3.0  5.6  5.1   Hemoglobin 12.0 - 15.0 g/dL 7.8  9.9  40.9   Hematocrit 36.0 - 46.0 % 23.7  29.9  30.7   Platelets 150 - 400 K/uL 65  166  311       Latest Ref Rng & Units 05/12/2023    8:23 AM 04/21/2023    8:42 AM 03/30/2023    7:50 AM  CMP  Glucose 70 - 99 mg/dL 98  811  92   BUN 6 - 20 mg/dL 8  8  9    Creatinine 0.44 - 1.00 mg/dL 9.14  7.82  9.56   Sodium 135 - 145 mmol/L 134  135  135   Potassium 3.5 - 5.1 mmol/L 3.6  3.8  3.7    Chloride 98 - 111 mmol/L 108  104  104   CO2 22 - 32 mmol/L 21  24  25    Calcium 8.9 - 10.3 mg/dL 8.4  8.7  8.3   Total Protein 6.5 - 8.1 g/dL 6.9  7.1  6.6   Total Bilirubin 0.3 - 1.2 mg/dL 0.2  0.4  0.3   Alkaline Phos 38 - 126 U/L 46  58  57   AST 15 - 41 U/L 27  19  17    ALT 0 - 44 U/L 15  12  12       No results  found for: "CEA1", "CEA" / No results found for: "CEA1", "CEA" No results found for: "PSA1" No results found for: "NWG956" No results found for: "CAN125"  No results found for: "TOTALPROTELP", "ALBUMINELP", "A1GS", "A2GS", "BETS", "BETA2SER", "GAMS", "MSPIKE", "SPEI" Lab Results  Component Value Date   TIBC 412 09/22/2022   FERRITIN 35 09/22/2022   IRONPCTSAT 7 (L) 09/22/2022   No results found for: "LDH"   STUDIES:   CT CHEST ABDOMEN PELVIS W CONTRAST  Result Date: 05/11/2023 CLINICAL DATA:  Metastatic cervical cancer restaging, ongoing chemotherapy * Tracking Code: BO * EXAM: CT CHEST, ABDOMEN, AND PELVIS WITH CONTRAST TECHNIQUE: Multidetector CT imaging of the chest, abdomen and pelvis was performed following the standard protocol during bolus administration of intravenous contrast. RADIATION DOSE REDUCTION: This exam was performed according to the departmental dose-optimization program which includes automated exposure control, adjustment of the mA and/or kV according to patient size and/or use of iterative reconstruction technique. CONTRAST:  OMNIPAQUE IOHEXOL 300 MG/ML SOLN additional oral enteric contrast COMPARISON:  12/29/2022 FINDINGS: CT CHEST FINDINGS Cardiovascular: Right chest port catheter. Normal heart size. No pericardial effusion. Mediastinum/Nodes: No enlarged mediastinal, hilar, or axillary lymph nodes. Thyroid gland, trachea, and esophagus demonstrate no significant findings. Lungs/Pleura: Mild diffuse bilateral bronchial wall thickening. Background of emphysema and very fine centrilobular nodularity, most concentrated in the lung apices. Small  subpleural nodule of the inferior peripheral lingula measures 0.4 cm on today's examination, previously 0.7 cm (series 3, image 86), an additional nodule just superiorly is now resolved. Nodule of the anterior lateral segment right middle lobe resolved (series 3, image 92). New nodule of the anterior medial segment right middle lobe measuring 0.4 cm (series 3, image 79). No pleural effusion or pneumothorax. Musculoskeletal: No chest wall abnormality. No acute osseous findings. CT ABDOMEN PELVIS FINDINGS Hepatobiliary: No solid liver abnormality is seen. No gallstones, gallbladder wall thickening, or biliary dilatation. Pancreas: Unremarkable. No pancreatic ductal dilatation or surrounding inflammatory changes. Spleen: Normal in size. Unchanged small subcapsular hypodensity of the anterior spleen measuring 0.8 cm, most likely a small infarct given change over time, of doubtful clinical significance (series 2, image 49) Adrenals/Urinary Tract: Adrenal glands are unremarkable. Kidneys are normal, without renal calculi, solid lesion, or hydronephrosis. Unchanged, thickened, decompressed urinary bladder. Stomach/Bowel: Stomach is within normal limits. Appendix appears normal. No evidence of bowel wall thickening, distention, or inflammatory changes. Vascular/Lymphatic: Aortic atherosclerosis. Diminished size of retroperitoneal lymph nodes, now significantly subcentimeter with no persistently enlarged nodes (series 2, image 61). Reproductive: Unchanged post treatment appearance of the cervix (series 5, image 100). Other: No abdominal wall hernia or abnormality. No ascites. Musculoskeletal: No acute osseous findings. IMPRESSION: 1. Diminished size of retroperitoneal lymph nodes, now significantly subcentimeter with no persistently enlarged lymph nodes. 2. No persistent peritoneal nodularity. 3. Multiple previously seen small pulmonary nodules are diminished or completely resolved. There is however a new small nodule of the  anterior medial segment right middle lobe measuring 0.4 cm, nonspecific, warranting attention on follow-up. 4. Background of emphysema and very fine centrilobular nodularity, most concentrated in the lung apices, consistent with smoking related respiratory bronchiolitis. 5. Unchanged, thickened, decompressed urinary bladder. This may reflect sequelae of local radiation therapy. Correlate with urinalysis. Aortic Atherosclerosis (ICD10-I70.0) and Emphysema (ICD10-J43.9). Electronically Signed   By: Jearld Lesch M.D.   On: 05/11/2023 12:00

## 2023-05-12 ENCOUNTER — Inpatient Hospital Stay (HOSPITAL_BASED_OUTPATIENT_CLINIC_OR_DEPARTMENT_OTHER): Payer: Medicaid Other | Admitting: Hematology

## 2023-05-12 ENCOUNTER — Inpatient Hospital Stay: Payer: Medicaid Other | Attending: Hematology

## 2023-05-12 ENCOUNTER — Inpatient Hospital Stay: Payer: Medicaid Other

## 2023-05-12 DIAGNOSIS — Z5112 Encounter for antineoplastic immunotherapy: Secondary | ICD-10-CM | POA: Diagnosis not present

## 2023-05-12 DIAGNOSIS — Z95828 Presence of other vascular implants and grafts: Secondary | ICD-10-CM | POA: Diagnosis not present

## 2023-05-12 DIAGNOSIS — F17210 Nicotine dependence, cigarettes, uncomplicated: Secondary | ICD-10-CM | POA: Insufficient documentation

## 2023-05-12 DIAGNOSIS — C7802 Secondary malignant neoplasm of left lung: Secondary | ICD-10-CM | POA: Insufficient documentation

## 2023-05-12 DIAGNOSIS — I10 Essential (primary) hypertension: Secondary | ICD-10-CM | POA: Insufficient documentation

## 2023-05-12 DIAGNOSIS — Z801 Family history of malignant neoplasm of trachea, bronchus and lung: Secondary | ICD-10-CM | POA: Diagnosis not present

## 2023-05-12 DIAGNOSIS — C787 Secondary malignant neoplasm of liver and intrahepatic bile duct: Secondary | ICD-10-CM | POA: Diagnosis not present

## 2023-05-12 DIAGNOSIS — C7801 Secondary malignant neoplasm of right lung: Secondary | ICD-10-CM | POA: Diagnosis not present

## 2023-05-12 DIAGNOSIS — G629 Polyneuropathy, unspecified: Secondary | ICD-10-CM | POA: Insufficient documentation

## 2023-05-12 DIAGNOSIS — D649 Anemia, unspecified: Secondary | ICD-10-CM | POA: Diagnosis not present

## 2023-05-12 DIAGNOSIS — Z5111 Encounter for antineoplastic chemotherapy: Secondary | ICD-10-CM | POA: Diagnosis not present

## 2023-05-12 DIAGNOSIS — C538 Malignant neoplasm of overlapping sites of cervix uteri: Secondary | ICD-10-CM

## 2023-05-12 DIAGNOSIS — C539 Malignant neoplasm of cervix uteri, unspecified: Secondary | ICD-10-CM | POA: Insufficient documentation

## 2023-05-12 DIAGNOSIS — D72819 Decreased white blood cell count, unspecified: Secondary | ICD-10-CM | POA: Insufficient documentation

## 2023-05-12 DIAGNOSIS — Z808 Family history of malignant neoplasm of other organs or systems: Secondary | ICD-10-CM | POA: Insufficient documentation

## 2023-05-12 DIAGNOSIS — Z7689 Persons encountering health services in other specified circumstances: Secondary | ICD-10-CM | POA: Diagnosis not present

## 2023-05-12 DIAGNOSIS — C786 Secondary malignant neoplasm of retroperitoneum and peritoneum: Secondary | ICD-10-CM | POA: Insufficient documentation

## 2023-05-12 LAB — COMPREHENSIVE METABOLIC PANEL
ALT: 15 U/L (ref 0–44)
AST: 27 U/L (ref 15–41)
Albumin: 3.2 g/dL — ABNORMAL LOW (ref 3.5–5.0)
Alkaline Phosphatase: 46 U/L (ref 38–126)
Anion gap: 5 (ref 5–15)
BUN: 8 mg/dL (ref 6–20)
CO2: 21 mmol/L — ABNORMAL LOW (ref 22–32)
Calcium: 8.4 mg/dL — ABNORMAL LOW (ref 8.9–10.3)
Chloride: 108 mmol/L (ref 98–111)
Creatinine, Ser: 0.85 mg/dL (ref 0.44–1.00)
GFR, Estimated: >60 mL/min (ref >60)
Glucose, Bld: 98 mg/dL (ref 70–99)
Potassium: 3.6 mmol/L (ref 3.5–5.1)
Sodium: 134 mmol/L — ABNORMAL LOW (ref 135–145)
Total Bilirubin: 0.2 mg/dL — ABNORMAL LOW (ref 0.3–1.2)
Total Protein: 6.9 g/dL (ref 6.5–8.1)

## 2023-05-12 LAB — CBC WITH DIFFERENTIAL/PLATELET
Abs Immature Granulocytes: 0 10*3/uL (ref 0.00–0.07)
Basophils Absolute: 0 10*3/uL (ref 0.0–0.1)
Basophils Relative: 0 %
Eosinophils Absolute: 0.1 10*3/uL (ref 0.0–0.5)
Eosinophils Relative: 2 %
HCT: 23.7 % — ABNORMAL LOW (ref 36.0–46.0)
Hemoglobin: 7.8 g/dL — ABNORMAL LOW (ref 12.0–15.0)
Lymphocytes Relative: 47 %
Lymphs Abs: 1.4 10*3/uL (ref 0.7–4.0)
MCH: 32.8 pg (ref 26.0–34.0)
MCHC: 32.9 g/dL (ref 30.0–36.0)
MCV: 99.6 fL (ref 80.0–100.0)
Monocytes Absolute: 0.5 10*3/uL (ref 0.1–1.0)
Monocytes Relative: 18 %
Neutro Abs: 1 10*3/uL — ABNORMAL LOW (ref 1.7–7.7)
Neutrophils Relative %: 33 %
Platelets: 65 10*3/uL — ABNORMAL LOW (ref 150–400)
RBC: 2.38 MIL/uL — ABNORMAL LOW (ref 3.87–5.11)
RDW: 17.7 % — ABNORMAL HIGH (ref 11.5–15.5)
WBC: 3 10*3/uL — ABNORMAL LOW (ref 4.0–10.5)
nRBC: 0 % (ref 0.0–0.2)
nRBC: 2 /100{WBCs} — ABNORMAL HIGH

## 2023-05-12 LAB — MAGNESIUM: Magnesium: 1.8 mg/dL (ref 1.7–2.4)

## 2023-05-12 MED ORDER — SODIUM CHLORIDE 0.9% FLUSH
10.0000 mL | INTRAVENOUS | Status: DC | PRN
  Administered 2023-05-12: 10 mL via INTRAVENOUS

## 2023-05-12 MED ORDER — HEPARIN SOD (PORK) LOCK FLUSH 100 UNIT/ML IV SOLN
500.0000 [IU] | Freq: Once | INTRAVENOUS | Status: AC
  Administered 2023-05-12: 500 [IU] via INTRAVENOUS

## 2023-05-12 NOTE — Patient Instructions (Addendum)
[facility] at [facility] Discharge Instructions   You were seen and examined today by Dr. Ellin Saba.  He reviewed the results of your lab work which are mostly normal/stable. Your platelet count and white count are too low for treatment today.   He reviewed the results of your CT scan that is showing the cancer has responded well to treatment. Some areas have shrunk, while other areas have disappeared altogether.   We will hold your treatment today.   Return as scheduled.    Thank you for choosing [facility] at [facility] to provide your oncology and hematology care.  To afford each patient quality time with our provider, please arrive at least 15 minutes before your scheduled appointment time.   If you have a lab appointment with the Cancer Center please come in thru the Main Entrance and check in at the main information desk.  You need to re-schedule your appointment should you arrive 10 or more minutes late.  We strive to give you quality time with our providers, and arriving late affects you and other patients whose appointments are after yours.  Also, if you no show three or more times for appointments you may be dismissed from the clinic at the providers discretion.     Again, thank you for choosing [facility].  Our hope is that these requests will decrease the amount of time that you wait before being seen by our physicians.       _____________________________________________________________  Should you have questions after your visit to [facility], please contact our office at [phone] and follow the prompts.  Our office hours are 8:00 a.m. and 4:30 p.m. Monday - Friday.  Please note that voicemails left after 4:00 p.m. may not be returned until the following business day.  We are closed weekends and major holidays.  You do have access to a nurse 24-7, just call the main number to the clinic [phone]  and do not press any options, hold on the line and a nurse will answer the phone.    For prescription refill requests, have your pharmacy contact our office and allow 72 hours.    Due to Covid, you will need to wear a mask upon entering the hospital. If you do not have a mask, a mask will be given to you at the Main Entrance upon arrival. For doctor visits, patients may have 1 support person age 39 or older with them. For treatment visits, patients can not have anyone with them due to social distancing guidelines and our immunocompromised population.

## 2023-05-12 NOTE — Progress Notes (Addendum)
No treatment today due to platelet count, per Ellin Saba, patient will return in next week for treatment.

## 2023-05-13 ENCOUNTER — Other Ambulatory Visit: Payer: Self-pay

## 2023-05-16 ENCOUNTER — Other Ambulatory Visit: Payer: Self-pay

## 2023-05-18 ENCOUNTER — Inpatient Hospital Stay: Payer: Medicaid Other

## 2023-05-18 VITALS — BP 131/84 | HR 103 | Temp 98.0°F | Resp 19 | Wt 172.2 lb

## 2023-05-18 VITALS — BP 134/81 | HR 98 | Temp 98.1°F | Resp 16

## 2023-05-18 DIAGNOSIS — Z5112 Encounter for antineoplastic immunotherapy: Secondary | ICD-10-CM | POA: Diagnosis not present

## 2023-05-18 DIAGNOSIS — C539 Malignant neoplasm of cervix uteri, unspecified: Secondary | ICD-10-CM | POA: Diagnosis not present

## 2023-05-18 DIAGNOSIS — Z7689 Persons encountering health services in other specified circumstances: Secondary | ICD-10-CM | POA: Diagnosis not present

## 2023-05-18 DIAGNOSIS — C7802 Secondary malignant neoplasm of left lung: Secondary | ICD-10-CM | POA: Diagnosis not present

## 2023-05-18 DIAGNOSIS — D649 Anemia, unspecified: Secondary | ICD-10-CM | POA: Diagnosis not present

## 2023-05-18 DIAGNOSIS — C538 Malignant neoplasm of overlapping sites of cervix uteri: Secondary | ICD-10-CM

## 2023-05-18 DIAGNOSIS — Z95828 Presence of other vascular implants and grafts: Secondary | ICD-10-CM

## 2023-05-18 DIAGNOSIS — F17210 Nicotine dependence, cigarettes, uncomplicated: Secondary | ICD-10-CM | POA: Diagnosis not present

## 2023-05-18 DIAGNOSIS — G629 Polyneuropathy, unspecified: Secondary | ICD-10-CM | POA: Diagnosis not present

## 2023-05-18 DIAGNOSIS — I10 Essential (primary) hypertension: Secondary | ICD-10-CM | POA: Diagnosis not present

## 2023-05-18 DIAGNOSIS — Z5111 Encounter for antineoplastic chemotherapy: Secondary | ICD-10-CM | POA: Diagnosis not present

## 2023-05-18 DIAGNOSIS — C786 Secondary malignant neoplasm of retroperitoneum and peritoneum: Secondary | ICD-10-CM | POA: Diagnosis not present

## 2023-05-18 DIAGNOSIS — D72819 Decreased white blood cell count, unspecified: Secondary | ICD-10-CM | POA: Diagnosis not present

## 2023-05-18 DIAGNOSIS — C787 Secondary malignant neoplasm of liver and intrahepatic bile duct: Secondary | ICD-10-CM | POA: Diagnosis not present

## 2023-05-18 DIAGNOSIS — C7801 Secondary malignant neoplasm of right lung: Secondary | ICD-10-CM | POA: Diagnosis not present

## 2023-05-18 DIAGNOSIS — Z801 Family history of malignant neoplasm of trachea, bronchus and lung: Secondary | ICD-10-CM | POA: Diagnosis not present

## 2023-05-18 LAB — URINALYSIS, DIPSTICK ONLY
Bilirubin Urine: NEGATIVE
Glucose, UA: NEGATIVE mg/dL
Hgb urine dipstick: NEGATIVE
Ketones, ur: 5 mg/dL — AB
Leukocytes,Ua: NEGATIVE
Nitrite: NEGATIVE
Protein, ur: 30 mg/dL — AB
Specific Gravity, Urine: 1.019 (ref 1.005–1.030)
pH: 6 (ref 5.0–8.0)

## 2023-05-18 LAB — CBC WITH DIFFERENTIAL/PLATELET
Abs Immature Granulocytes: 0.02 10*3/uL (ref 0.00–0.07)
Basophils Absolute: 0 10*3/uL (ref 0.0–0.1)
Basophils Relative: 0 %
Eosinophils Absolute: 0 10*3/uL (ref 0.0–0.5)
Eosinophils Relative: 1 %
HCT: 23.5 % — ABNORMAL LOW (ref 36.0–46.0)
Hemoglobin: 7.7 g/dL — ABNORMAL LOW (ref 12.0–15.0)
Immature Granulocytes: 1 %
Lymphocytes Relative: 30 %
Lymphs Abs: 1.2 10*3/uL (ref 0.7–4.0)
MCH: 33.2 pg (ref 26.0–34.0)
MCHC: 32.8 g/dL (ref 30.0–36.0)
MCV: 101.3 fL — ABNORMAL HIGH (ref 80.0–100.0)
Monocytes Absolute: 0.5 10*3/uL (ref 0.1–1.0)
Monocytes Relative: 12 %
Neutro Abs: 2.4 10*3/uL (ref 1.7–7.7)
Neutrophils Relative %: 56 %
Platelets: 131 10*3/uL — ABNORMAL LOW (ref 150–400)
RBC: 2.32 MIL/uL — ABNORMAL LOW (ref 3.87–5.11)
RDW: 20.4 % — ABNORMAL HIGH (ref 11.5–15.5)
WBC: 4.2 10*3/uL (ref 4.0–10.5)
nRBC: 0.5 % — ABNORMAL HIGH (ref 0.0–0.2)

## 2023-05-18 LAB — COMPREHENSIVE METABOLIC PANEL
ALT: 14 U/L (ref 0–44)
AST: 23 U/L (ref 15–41)
Albumin: 3.1 g/dL — ABNORMAL LOW (ref 3.5–5.0)
Alkaline Phosphatase: 47 U/L (ref 38–126)
Anion gap: 11 (ref 5–15)
BUN: 10 mg/dL (ref 6–20)
CO2: 22 mmol/L (ref 22–32)
Calcium: 8.7 mg/dL — ABNORMAL LOW (ref 8.9–10.3)
Chloride: 102 mmol/L (ref 98–111)
Creatinine, Ser: 1.09 mg/dL — ABNORMAL HIGH (ref 0.44–1.00)
GFR, Estimated: >60 mL/min (ref >60)
Glucose, Bld: 102 mg/dL — ABNORMAL HIGH (ref 70–99)
Potassium: 3.7 mmol/L (ref 3.5–5.1)
Sodium: 135 mmol/L (ref 135–145)
Total Bilirubin: 0.4 mg/dL (ref 0.3–1.2)
Total Protein: 6.7 g/dL (ref 6.5–8.1)

## 2023-05-18 LAB — MAGNESIUM: Magnesium: 1.8 mg/dL (ref 1.7–2.4)

## 2023-05-18 MED ORDER — SODIUM CHLORIDE 0.9 % IV SOLN
530.0000 mg | Freq: Once | INTRAVENOUS | Status: AC
  Administered 2023-05-18: 530 mg via INTRAVENOUS
  Filled 2023-05-18: qty 53

## 2023-05-18 MED ORDER — SODIUM CHLORIDE 0.9 % IV SOLN
10.0000 mg | Freq: Once | INTRAVENOUS | Status: AC
  Administered 2023-05-18: 10 mg via INTRAVENOUS
  Filled 2023-05-18: qty 10

## 2023-05-18 MED ORDER — HEPARIN SOD (PORK) LOCK FLUSH 100 UNIT/ML IV SOLN
500.0000 [IU] | Freq: Once | INTRAVENOUS | Status: AC | PRN
  Administered 2023-05-18: 500 [IU]

## 2023-05-18 MED ORDER — FAMOTIDINE IN NACL 20-0.9 MG/50ML-% IV SOLN
20.0000 mg | Freq: Once | INTRAVENOUS | Status: AC
  Administered 2023-05-18: 20 mg via INTRAVENOUS
  Filled 2023-05-18: qty 50

## 2023-05-18 MED ORDER — PALONOSETRON HCL INJECTION 0.25 MG/5ML
0.2500 mg | Freq: Once | INTRAVENOUS | Status: AC
  Administered 2023-05-18: 0.25 mg via INTRAVENOUS
  Filled 2023-05-18: qty 5

## 2023-05-18 MED ORDER — SODIUM CHLORIDE 0.9% FLUSH
10.0000 mL | INTRAVENOUS | Status: DC | PRN
  Administered 2023-05-18: 10 mL

## 2023-05-18 MED ORDER — CETIRIZINE HCL 10 MG/ML IV SOLN
10.0000 mg | Freq: Once | INTRAVENOUS | Status: AC
  Administered 2023-05-18: 10 mg via INTRAVENOUS
  Filled 2023-05-18: qty 1

## 2023-05-18 MED ORDER — SODIUM CHLORIDE 0.9 % IV SOLN
200.0000 mg | Freq: Once | INTRAVENOUS | Status: AC
  Administered 2023-05-18: 200 mg via INTRAVENOUS
  Filled 2023-05-18: qty 8

## 2023-05-18 MED ORDER — SODIUM CHLORIDE 0.9 % IV SOLN: Freq: Once | INTRAVENOUS | Status: AC

## 2023-05-18 MED ORDER — SODIUM CHLORIDE 0.9% FLUSH
10.0000 mL | INTRAVENOUS | Status: AC
  Administered 2023-05-18: 10 mL

## 2023-05-18 MED ORDER — SODIUM CHLORIDE 0.9 % IV SOLN
150.0000 mg | Freq: Once | INTRAVENOUS | Status: AC
  Administered 2023-05-18: 150 mg via INTRAVENOUS
  Filled 2023-05-18: qty 150

## 2023-05-18 MED ORDER — SODIUM CHLORIDE 0.9 % IV SOLN
15.0000 mg/kg | Freq: Once | INTRAVENOUS | Status: AC
  Administered 2023-05-18: 1300 mg via INTRAVENOUS
  Filled 2023-05-18: qty 48

## 2023-05-18 NOTE — Patient Instructions (Signed)
[facility]  Discharge Instructions: Thank you for choosing [facility] to provide your oncology and hematology care.  If you have a lab appointment with the Cancer Center - please note that after April 8th, 2024, all labs will be drawn in the cancer center.  You do not have to check in or register with the main entrance as you have in the past but will complete your check-in in the cancer center.  Wear comfortable clothing and clothing appropriate for easy access to any Portacath or PICC line.   We strive to give you quality time with your provider. You may need to reschedule your appointment if you arrive late (15 or more minutes).  Arriving late affects you and other patients whose appointments are after yours.  Also, if you miss three or more appointments without notifying the office, you may be dismissed from the clinic at the provider's discretion.      For prescription refill requests, have your pharmacy contact our office and allow 72 hours for refills to be completed.    Today you received the following chemotherapy and/or immunotherapy agents keytruda, avastin, and carboplatin.       To help prevent nausea and vomiting after your treatment, we encourage you to take your nausea medication as directed.  BELOW ARE SYMPTOMS THAT SHOULD BE REPORTED IMMEDIATELY: *FEVER GREATER THAN 100.4 F (38 C) OR HIGHER *CHILLS OR SWEATING *NAUSEA AND VOMITING THAT IS NOT CONTROLLED WITH YOUR NAUSEA MEDICATION *UNUSUAL SHORTNESS OF BREATH *UNUSUAL BRUISING OR BLEEDING *URINARY PROBLEMS (pain or burning when urinating, or frequent urination) *BOWEL PROBLEMS (unusual diarrhea, constipation, pain near the anus) TENDERNESS IN MOUTH AND THROAT WITH OR WITHOUT PRESENCE OF ULCERS (sore throat, sores in mouth, or a toothache) UNUSUAL RASH, SWELLING OR PAIN  UNUSUAL VAGINAL DISCHARGE OR ITCHING   Items with * indicate a potential emergency and should be followed up as soon  as possible or go to the Emergency Department if any problems should occur.  Please show the CHEMOTHERAPY ALERT CARD or IMMUNOTHERAPY ALERT CARD at check-in to the Emergency Department and triage nurse.  Should you have questions after your visit or need to cancel or reschedule your appointment, please contact [facility] [phone]  and follow the prompts.  Office hours are 8:00 a.m. to 4:30 p.m. Monday - Friday. Please note that voicemails left after 4:00 p.m. may not be returned until the following business day.  We are closed weekends and major holidays. You have access to a nurse at all times for urgent questions. Please call the main number to the clinic [phone] and follow the prompts.  For any non-urgent questions, you may also contact your provider using MyChart. We now offer e-Visits for anyone 42 and older to request care online for non-urgent symptoms. For details visit [email or web address].   Also download the MyChart app! Go to the app store, search "MyChart", open the app, select [place], and log in with your MyChart username and password.

## 2023-05-18 NOTE — Progress Notes (Signed)
Patient tolerated chemotherapy with no complaints voiced.  Side effects with management reviewed with understanding verbalized.  Port site clean and dry with no bruising or swelling noted at site.  Good blood return noted before and after administration of chemotherapy.  Band aid applied.  Patient left in satisfactory condition with VSS and no s/s of distress noted.   

## 2023-05-24 ENCOUNTER — Other Ambulatory Visit: Payer: Self-pay

## 2023-05-24 ENCOUNTER — Encounter (HOSPITAL_COMMUNITY): Payer: Self-pay

## 2023-05-24 ENCOUNTER — Emergency Department (HOSPITAL_COMMUNITY)
Admission: EM | Admit: 2023-05-24 | Discharge: 2023-05-24 | Disposition: A | Discharge status: Home / Self Care | Payer: Medicaid Other | Attending: Emergency Medicine | Admitting: Emergency Medicine

## 2023-05-24 DIAGNOSIS — W4904XA Ring or other jewelry causing external constriction, initial encounter: Secondary | ICD-10-CM | POA: Diagnosis not present

## 2023-05-24 DIAGNOSIS — T7840XA Allergy, unspecified, initial encounter: Secondary | ICD-10-CM | POA: Diagnosis not present

## 2023-05-24 DIAGNOSIS — Z79899 Other long term (current) drug therapy: Secondary | ICD-10-CM | POA: Insufficient documentation

## 2023-05-24 DIAGNOSIS — S60449A External constriction of unspecified finger, initial encounter: Secondary | ICD-10-CM

## 2023-05-24 DIAGNOSIS — S60455A Superficial foreign body of left ring finger, initial encounter: Secondary | ICD-10-CM | POA: Insufficient documentation

## 2023-05-24 DIAGNOSIS — Z8541 Personal history of malignant neoplasm of cervix uteri: Secondary | ICD-10-CM | POA: Diagnosis not present

## 2023-05-24 MED ORDER — PREDNISONE 50 MG PO TABS
60.0000 mg | ORAL_TABLET | Freq: Once | ORAL | Status: AC
  Administered 2023-05-24: 60 mg via ORAL
  Filled 2023-05-24: qty 1

## 2023-05-24 MED ORDER — PREDNISONE 20 MG PO TABS
40.0000 mg | ORAL_TABLET | Freq: Every day | ORAL | 0 refills | Status: DC
Start: 2023-05-25 — End: 2023-05-29

## 2023-05-24 MED ORDER — DIPHENHYDRAMINE HCL 25 MG PO CAPS
25.0000 mg | ORAL_CAPSULE | Freq: Once | ORAL | Status: AC
  Administered 2023-05-24: 25 mg via ORAL
  Filled 2023-05-24: qty 1

## 2023-05-24 MED ORDER — CETIRIZINE HCL 10 MG PO TABS
10.0000 mg | ORAL_TABLET | Freq: Every day | ORAL | 0 refills | Status: DC
Start: 2023-05-24 — End: 2023-10-28

## 2023-05-24 NOTE — ED Triage Notes (Addendum)
C/o swelling to hands and face after using hair dye on Friday.  Pt reports unabl to get ring off to left hand due to swelling.  Denies sob

## 2023-05-24 NOTE — ED Notes (Signed)
Placed in FT temporarily

## 2023-05-24 NOTE — Discharge Instructions (Addendum)
It was a pleasure taking care of you today.  You seen for an allergic reaction to hair dye.  Avoid contact with this product in the future.  We are prescribing you steroids and antihistamines.  You are already on prednisone daily per your medication list.  Do not take this in addition to the medication you are taking now.  You can resume after this higher steroid dose is over.  Call your oncologist tomorrow to let him know you are on a higher steroid dose to make sure this does not change anything with your chemotherapy.  Back to the ER if you have new or worsening symptoms.

## 2023-05-24 NOTE — ED Provider Notes (Signed)
EMERGENCY DEPARTMENT AT [facility] Provider Note   CSN: [id number] Arrival date & time: 05/24/23  1318     History  Chief Complaint  Patient presents with   Allergic Reaction    Kristin Carson is a 52 y.o. female.  To the ER today for allergic reaction to hair dye, diet a family member's hair yesterday and started having itching and rash to hands and arms, she has some swelling of her hands last night which is worse today, no swelling of her face lips or tongue, no trouble swallowing or breathing, no other complaints.  She does have history of cervical cancer that she states is spread to her lungs does not chemotherapy, denies complaints related to this, otherwise feeling well but is concerned because her wedding ring is stuck on her left finger and the finger is becoming more swollen and started become slightly painful.   Allergic Reaction      Home Medications Prior to Admission medications   Medication Sig Start Date End Date Taking? Authorizing Provider  cetirizine (ZYRTEC ALLERGY) 10 MG tablet Take 1 tablet (10 mg total) by mouth daily. 05/24/23  Yes Aidynn Krenn A, PA-C  predniSONE (DELTASONE) 20 MG tablet Take 2 tablets (40 mg total) by mouth daily for 4 days. 05/25/23 05/29/23 Yes Olayinka Gathers A, PA-C  albuterol (VENTOLIN HFA) 108 (90 Base) MCG/ACT inhaler Inhale 2 puffs into the lungs every 6 (six) hours as needed for wheezing or shortness of breath. 11/18/22   Billie Lade, MD  amLODipine (NORVASC) 10 MG tablet Take 1 tablet (10 mg total) by mouth daily. 04/03/23   Billie Lade, MD  Atezolizumab (TECENTRIQ IV) Inject into the vein every 21 ( twenty-one) days. 10/02/22   [provider]  Bevacizumab (AVASTIN IV) Inject into the vein every 21 ( twenty-one) days.    [provider]  calcium carbonate (TUMS) 500 MG chewable tablet Chew 1 tablet (200 mg of elemental calcium total) by mouth 3 (three) times daily with meals. 10/10/22    Carver Fila, MD  DULoxetine (CYMBALTA) 60 MG capsule Take 1 capsule (60 mg total) by mouth daily. 12/24/22   Doreatha Massed, MD  HYDROcodone-acetaminophen (NORCO/VICODIN) 5-325 MG tablet Take 1 tablet by mouth every 6 (six) hours as needed for moderate pain. 04/27/23   Doreatha Massed, MD  hydrOXYzine (ATARAX) 10 MG tablet Take 1 tablet (10 mg total) by mouth 3 (three) times daily as needed for itching. 04/02/23   Doreatha Massed, MD  lidocaine-prilocaine (EMLA) cream Apply a dime-sized amount to port a cath site and cover with plastic wrap 1 hour prior to infusion appointments 09/25/22   Doreatha Massed, MD  loperamide (IMODIUM) 2 MG capsule Take 1 capsule (2 mg total) by mouth as needed for diarrhea or loose stools (Take 2 capsules after the first loose stool and 1 capsule after each loose stool thereafter.  DO NOT EXCEED 8 capsules a day.). 03/12/23   Doreatha Massed, MD  LORazepam (ATIVAN) 0.5 MG tablet Take one tablet by mouth three hours prior to CT scan; take the second tablet by mouth upon arrival to radiology for procedure 10/23/22   Doreatha Massed, MD  megestrol (MEGACE) 400 MG/10ML suspension Take 10 mLs (400 mg total) by mouth 2 (two) times daily. 01/01/23   Doreatha Massed, MD  nicotine polacrilex (NICORETTE) 4 MG gum Take 1 each (4 mg total) by mouth as needed for smoking cessation. 11/18/22   Billie Lade, MD  ondansetron (ZOFRAN) 8 MG tablet Take 1 tablet (8 mg total) by mouth every 8 (eight) hours as needed for nausea or vomiting. Start on the third day after cisplatin. 09/25/22   Doreatha Massed, MD  PACLITAXEL IV Inject into the vein every 21 ( twenty-one) days.    [provider]  pantoprazole (PROTONIX) 40 MG tablet Take 1 tablet (40 mg total) by mouth daily. 10/13/22   Carnella Guadalajara, PA-C  predniSONE (DELTASONE) 5 MG tablet Take 1 tablet (5 mg total) by mouth daily with breakfast. 12/05/22   Doreatha Massed, MD   pregabalin (LYRICA) 200 MG capsule Take 1 capsule (200 mg total) by mouth 3 (three) times daily. 04/30/23   Doreatha Massed, MD  prochlorperazine (COMPAZINE) 10 MG tablet Take 1 tablet (10 mg total) by mouth every 6 (six) hours as needed (Nausea or vomiting). 09/25/22   Doreatha Massed, MD  triamcinolone cream (KENALOG) 0.1 % Apply 1 Application topically 2 (two) times daily. 11/18/22   Billie Lade, MD  famotidine (PEPCID) 20 MG tablet Take 1 tablet (20 mg total) by mouth 2 (two) times daily. Patient not taking: Reported on 01/12/2018 12/18/17 02/02/20  Devoria Albe, MD  hydrochlorothiazide (HYDRODIURIL) 25 MG tablet Take 1 tablet (25 mg total) by mouth daily. 06/17/19 02/02/20  Devoria Albe, MD      Allergies    Carrot [daucus carota], Lisinopril-hydrochlorothiazide, Ibuprofen, Tramadol, and Erythromycin    Review of Systems   Review of Systems  Physical Exam Updated Vital Signs BP 131/81   Pulse 90   Temp 98 F (36.7 C)   Resp 20   Wt 78 kg   LMP 08/15/2022 (Approximate)   SpO2 100%   BMI 27.76 kg/m  Physical Exam Vitals and nursing note reviewed.  Constitutional:      General: She is not in acute distress.    Appearance: She is well-developed.  HENT:     Head: Normocephalic and atraumatic.     Mouth/Throat:     Mouth: Mucous membranes are moist.  Eyes:     Conjunctiva/sclera: Conjunctivae normal.  Cardiovascular:     Rate and Rhythm: Normal rate and regular rhythm.     Heart sounds: No murmur heard. Pulmonary:     Effort: Pulmonary effort is normal. No respiratory distress.     Breath sounds: Normal breath sounds.  Abdominal:     Palpations: Abdomen is soft.     Tenderness: There is no abdominal tenderness.  Musculoskeletal:        General: No swelling.     Cervical back: Neck supple.     Comments: Swelling to fingers to bilateral hands diffusely with good capillary refill.  More moderate swelling of the left ring finger, wedding ring on finger not able to be  removed  Skin:    General: Skin is warm and dry.     Capillary Refill: Capillary refill takes less than 2 seconds.  Neurological:     General: No focal deficit present.     Mental Status: She is alert and oriented to person, place, and time.  Psychiatric:        Mood and Affect: Mood normal.     ED Results / Procedures / Treatments   Labs (all labs ordered are listed, but only abnormal results are displayed) Labs Reviewed - No data to display  EKG None  Radiology No results found.  Procedures .Foreign Body Removal  Date/Time: 05/24/2023 4:56 PM  Performed by: Ma Rings, PA-C Authorized by:  Ma Rings, PA-C  Consent: Verbal consent obtained. Consent given by: patient Intake: left finger. Complexity: simple Post-procedure assessment: foreign body removed Comments: Moved a ring from left ring finger using trauma shears to cut the ring and hemostats to pry the ring apart.  Patient tolerated this well.  No skin injury noted after procedure, normal range of motion after procedure, normal distal neurovascular status as well.      Medications Ordered in ED Medications  predniSONE (DELTASONE) tablet 60 mg (60 mg Oral Given 05/24/23 1412)  diphenhydrAMINE (BENADRYL) capsule 25 mg (25 mg Oral Given 05/24/23 1413)    ED Course/ Medical Decision Making/ A&P                                 Medical Decision Making DDx: Contact dermatitis, allergic reaction, foreign body, other  Course: Patient care daily on a family member yesterday and started having itching and swelling to her hands and a rash to her bilateral arms.  She has no signs of anaphylactic reaction, no angioedema of face, no trouble swallowing or breathing and speaks in full clear sentences with normal phonation.  She does have some swelling to the hands and rash to the arms that is itchy.  She is well-appearing but initially slightly tachycardic, denies any specific complaints.  Was given prednisone and  Benadryl and recheck heart rate is normal she is feeling much better.  We then addressed the wedding ring that was stuck on her finger due to some swelling from her likely allergic reaction.  Not able to remove with lubrication, ice elevation and attempted to use Estring under the ring wrapping around her finger to decrease the swelling but was not successful.  Patient did give me permission to cut the ring off.  Is able to use the ring cutter by trauma shears to cut the ring and pry the ring apart.  Removed out incident, there is no injury to her finger underlying, normal range of motion.  Patient given steroids and antihistamines for home.  Advised to call her oncologist tomorrow to her that they are aware of this.  She is already on prednisone 5 mg daily, advised to stop that until she is done with the higher dose steroid burst, and then resume..  Follow-Up and Return Precautions.           Final Clinical Impression(s) / ED Diagnoses Final diagnoses:  Allergic reaction, initial encounter  Tight ring on finger    Rx / DC Orders ED Discharge Orders          Ordered    predniSONE (DELTASONE) 20 MG tablet  Daily        05/24/23 1648    cetirizine (ZYRTEC ALLERGY) 10 MG tablet  Daily        05/24/23 [address] PA-C 05/24/23 1657    Eber Hong, MD 05/25/23 [phone]

## 2023-05-26 ENCOUNTER — Encounter: Payer: Self-pay | Admitting: Internal Medicine

## 2023-05-26 ENCOUNTER — Ambulatory Visit: Payer: Medicaid Other | Admitting: Internal Medicine

## 2023-05-26 VITALS — BP 132/74 | HR 109 | Resp 16 | Ht 63.5 in | Wt 163.0 lb

## 2023-05-26 DIAGNOSIS — G620 Drug-induced polyneuropathy: Secondary | ICD-10-CM | POA: Diagnosis not present

## 2023-05-26 DIAGNOSIS — K219 Gastro-esophageal reflux disease without esophagitis: Secondary | ICD-10-CM | POA: Diagnosis not present

## 2023-05-26 DIAGNOSIS — I10 Essential (primary) hypertension: Secondary | ICD-10-CM | POA: Diagnosis not present

## 2023-05-26 DIAGNOSIS — Z7689 Persons encountering health services in other specified circumstances: Secondary | ICD-10-CM | POA: Diagnosis not present

## 2023-05-26 DIAGNOSIS — C538 Malignant neoplasm of overlapping sites of cervix uteri: Secondary | ICD-10-CM

## 2023-05-26 DIAGNOSIS — F3341 Major depressive disorder, recurrent, in partial remission: Secondary | ICD-10-CM | POA: Diagnosis not present

## 2023-05-26 DIAGNOSIS — F17219 Nicotine dependence, cigarettes, with unspecified nicotine-induced disorders: Secondary | ICD-10-CM | POA: Diagnosis not present

## 2023-05-26 DIAGNOSIS — J449 Chronic obstructive pulmonary disease, unspecified: Secondary | ICD-10-CM | POA: Diagnosis not present

## 2023-05-26 DIAGNOSIS — T451X5A Adverse effect of antineoplastic and immunosuppressive drugs, initial encounter: Secondary | ICD-10-CM

## 2023-05-26 MED ORDER — NICOTINE POLACRILEX 4 MG MT GUM
4.0000 mg | CHEWING_GUM | OROMUCOSAL | 0 refills | Status: DC | PRN
Start: 2023-05-26 — End: 2023-10-28

## 2023-05-26 MED ORDER — ALBUTEROL SULFATE HFA 108 (90 BASE) MCG/ACT IN AERS
2.0000 | INHALATION_SPRAY | Freq: Four times a day (QID) | RESPIRATORY_TRACT | 2 refills | Status: DC | PRN
Start: 2023-05-26 — End: 2023-10-28

## 2023-05-26 MED ORDER — PANTOPRAZOLE SODIUM 40 MG PO TBEC: 40.0000 mg | DELAYED_RELEASE_TABLET | Freq: Every day | ORAL | 1 refills | Status: DC

## 2023-05-26 NOTE — Assessment & Plan Note (Signed)
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  Diagnosed in January.  She is currently undergoing chemotherapy and is closely followed by oncology (Dr. Ellin Saba).  Last seen for follow-up on 9/10.  Per documentation, retroperitoneal lymph nodes have decreased in size, now subcentimeter.  She is tolerating chemotherapy reasonably well.  Paclitaxel has been discontinued due to neuropathy.  She remains on carboplatin, bevacizumab, and pembrolizumab. -Oncology follow-up scheduled for 10/8

## 2023-05-26 NOTE — Assessment & Plan Note (Signed)
Lexapro 10 mg daily was prescribed at her last appointment for treatment of MDD.  In the interim, this has been discontinued and she is currently prescribed Cymbalta 60 mg daily.  Mood is stable currently.  No additional changes are indicated at this time.

## 2023-05-26 NOTE — Assessment & Plan Note (Signed)
 Remains adequately controlled on current antihypertensive regimen.  No medication changes are indicated today.

## 2023-05-26 NOTE — Assessment & Plan Note (Signed)
Continues to smoke roughly 0.5 packs/day of cigarettes and has been smoking since age 52.  She previously expressed an interest in smoking cessation and Nicorette gum was prescribed, however she reports today that the prescription was never filled at her pharmacy.  She remains interested in smoking cessation and requests that a new prescription for Nicorette gum is sent today. -Nicorette gum prescribed again today

## 2023-05-26 NOTE — Assessment & Plan Note (Signed)
Symptoms remain well-controlled on Protonix 40 mg as needed.  Refill provided today.

## 2023-05-26 NOTE — Patient Instructions (Signed)
It was a pleasure to see you today.  Thank you for giving [place] the opportunity to be involved in your care.  Below is a brief recap of your visit and next steps.  We will plan to see you again in 6 months.  Summary No medication changes today Protonix, albuterol, and Nicorette gum refilled Follow up in 6 months

## 2023-05-26 NOTE — Assessment & Plan Note (Signed)
Asymptomatic currently.  Pulmonary exam unremarkable.  Albuterol refilled at patient's request.

## 2023-05-26 NOTE — Progress Notes (Signed)
Established Patient Office Visit  Subjective   Patient ID: Kristin Carson, female    DOB: 12/28/1970  Age: 52 y.o. MRN: [id number]  Chief Complaint  Patient presents with   Follow-up   Kristin Carson returns to care today for routine follow-up.  She was last evaluated by me on 3/19 as a new patient presenting to establish care.  At that time she expressed a desire to stop smoking and Nicorette gum was prescribed.  She additionally became tearful when discussing recent life events.  PHQ-9 score was elevated.  Lexapro 10 mg daily was started for treatment of MDD.  In the interim, she has been followed by oncology and is undergoing chemotherapy for treatment of cervical cancer.  ED presentation earlier this week on 9/22 for an allergic reaction while dying a family members here.  She developed an itching rash on her hands and arms.  There have otherwise been no acute interval events.  Kristin Carson reports feeling fairly well today.  She endorses chronic fatigue but is otherwise asymptomatic and has no acute concerns to discuss.  Past Medical History:  Diagnosis Date   Anemia    Asthma    Bronchitis    Cancer (HCC)    Cervical   CHF (congestive heart failure) (HCC)    COPD (chronic obstructive pulmonary disease) (HCC)    Depression    GERD (gastroesophageal reflux disease)    Headache    Hypertension    Port-A-Cath in place 09/25/2022   Past Surgical History:  Procedure Laterality Date   BREAST SURGERY     CESAREAN SECTION     IR IMAGING GUIDED PORT INSERTION  09/30/2022   LEG SURGERY     Social History   Tobacco Use   Smoking status: Every Day    Current packs/day: 0.25    Average packs/day: 0.3 packs/day for 23.0 years (5.8 ttl pk-yrs)    Types: Cigarettes   Smokeless tobacco: Never  Vaping Use   Vaping status: Never Used  Substance Use Topics   Alcohol use: Yes    Comment: occ   Drug use: Yes    Types: Marijuana   Family History  Problem Relation Age of Onset   Dermatomyositis  Mother    Hypertension Mother    Cancer Mother        lung   Cancer Father        lung   Heart disease Father    Lung cancer Paternal Uncle    Lung cancer Maternal Uncle    Colon cancer Neg Hx    Breast cancer Neg Hx    Ovarian cancer Neg Hx    Endometrial cancer Neg Hx    Pancreatic cancer Neg Hx    Prostate cancer Neg Hx    Allergies  Allergen Reactions   Carrot [Daucus Carota] Hives   Lisinopril-Hydrochlorothiazide     Oral swelling   Ibuprofen Other (See Comments)    Oral swelling   Tramadol Hives   Erythromycin Hives   Review of Systems  Constitutional:  Positive for malaise/fatigue (Chronic).  All other systems reviewed and are negative.    Objective:     BP 132/74   Pulse (!) 109   Resp 16   Ht 5' 3.5" (1.613 m)   Wt 163 lb (73.9 kg)   LMP 08/15/2022 (Approximate)   SpO2 98%   BMI 28.42 kg/m  BP Readings from Last 3 Encounters:  05/26/23 132/74  05/24/23 131/81  05/18/23 134/81  Physical Exam Vitals reviewed.  Constitutional:      General: She is not in acute distress.    Appearance: Normal appearance. She is not toxic-appearing.  HENT:     Head: Normocephalic and atraumatic.     Right Ear: External ear normal.     Left Ear: External ear normal.     Nose: Nose normal. No congestion or rhinorrhea.     Mouth/Throat:     Mouth: Mucous membranes are moist.     Pharynx: Oropharynx is clear. No oropharyngeal exudate or posterior oropharyngeal erythema.  Eyes:     General: No scleral icterus.    Extraocular Movements: Extraocular movements intact.     Conjunctiva/sclera: Conjunctivae normal.     Pupils: Pupils are equal, round, and reactive to light.  Cardiovascular:     Rate and Rhythm: Normal rate and regular rhythm.     Pulses: Normal pulses.     Heart sounds: Normal heart sounds. No murmur heard.    No friction rub. No gallop.  Pulmonary:     Effort: Pulmonary effort is normal.     Breath sounds: Normal breath sounds. No wheezing, rhonchi  or rales.  Abdominal:     General: Abdomen is flat. Bowel sounds are normal. There is no distension.     Palpations: Abdomen is soft.     Tenderness: There is no abdominal tenderness.  Musculoskeletal:        General: No swelling. Normal range of motion.     Cervical back: Normal range of motion.     Right lower leg: No edema.     Left lower leg: No edema.     Comments: Port-A-Cath in right chest wall  Lymphadenopathy:     Cervical: No cervical adenopathy.  Skin:    General: Skin is warm and dry.     Capillary Refill: Capillary refill takes less than 2 seconds.     Coloration: Skin is not jaundiced.  Neurological:     General: No focal deficit present.     Mental Status: She is alert and oriented to person, place, and time.  Psychiatric:        Mood and Affect: Mood normal.        Behavior: Behavior normal.   Last CBC Lab Results  Component Value Date   WBC 4.2 05/18/2023   HGB 7.7 (L) 05/18/2023   HCT 23.5 (L) 05/18/2023   MCV 101.3 (H) 05/18/2023   MCH 33.2 05/18/2023   RDW 20.4 (H) 05/18/2023   PLT 131 (L) 05/18/2023   Last metabolic panel Lab Results  Component Value Date   GLUCOSE 102 (H) 05/18/2023   NA 135 05/18/2023   K 3.7 05/18/2023   CL 102 05/18/2023   CO2 22 05/18/2023   BUN 10 05/18/2023   CREATININE 1.09 (H) 05/18/2023   GFRNONAA >60 05/18/2023   CALCIUM 8.7 (L) 05/18/2023   PROT 6.7 05/18/2023   ALBUMIN 3.1 (L) 05/18/2023   BILITOT 0.4 05/18/2023   ALKPHOS 47 05/18/2023   AST 23 05/18/2023   ALT 14 05/18/2023   ANIONGAP 11 05/18/2023   Last lipids Lab Results  Component Value Date   CHOL 155 11/25/2016   HDL 55 11/25/2016   LDLCALC 87 11/25/2016   TRIG 63 11/25/2016   CHOLHDL 2.8 11/25/2016   Last hemoglobin A1c Lab Results  Component Value Date   HGBA1C 5.3 08/29/2022   Last thyroid functions Lab Results  Component Value Date   TSH 5.325 (H) 03/30/2023  Assessment & Plan:   Problem List Items Addressed This Visit        Essential hypertension    Remains adequately controlled on current antihypertensive regimen.  No medication changes are indicated today.      Chronic obstructive pulmonary disease (HCC)    Asymptomatic currently.  Pulmonary exam unremarkable.  Albuterol refilled at patient's request.      GERD (gastroesophageal reflux disease) - Primary    Symptoms remain well-controlled on Protonix 40 mg as needed.  Refill provided today.      Cigarette nicotine dependence with nicotine-induced disorder    Continues to smoke roughly 0.5 packs/day of cigarettes and has been smoking since age 63.  She previously expressed an interest in smoking cessation and Nicorette gum was prescribed, however she reports today that the prescription was never filled at her pharmacy.  She remains interested in smoking cessation and requests that a new prescription for Nicorette gum is sent today. -Nicorette gum prescribed again today      Peripheral neuropathy due to chemotherapy [facility])    She endorses neuropathic pain in her lower extremities that has developed since starting chemotherapy.  She is currently prescribed Lyrica, Cymbalta, and hydrocodone.  Requesting refill of hydrocodone today.  I have recommended that she discuss this with her oncologist (Dr. Ellin Saba) who has been managing her hydrocodone prescription.      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  Diagnosed in January.  She is currently undergoing chemotherapy and is closely followed by oncology (Dr. Ellin Saba).  Last seen for follow-up on 9/10.  Per documentation, retroperitoneal lymph nodes have decreased in size, now subcentimeter.  She is tolerating chemotherapy reasonably well.  Paclitaxel has been discontinued due to neuropathy.  She remains on carboplatin, bevacizumab, and pembrolizumab. -Oncology follow-up scheduled for 10/8      MDD (major depressive disorder)    Lexapro 10 mg daily was prescribed at her  last appointment for treatment of MDD.  In the interim, this has been discontinued and she is currently prescribed Cymbalta 60 mg daily.  Mood is stable currently.  No additional changes are indicated at this time.      Return in about 6 months (around 11/23/2023).   Billie Lade, MD

## 2023-05-26 NOTE — Assessment & Plan Note (Signed)
She endorses neuropathic pain in her lower extremities that has developed since starting chemotherapy.  She is currently prescribed Lyrica, Cymbalta, and hydrocodone.  Requesting refill of hydrocodone today.  I have recommended that she discuss this with her oncologist (Dr. Ellin Saba) who has been managing her hydrocodone prescription.

## 2023-05-28 ENCOUNTER — Other Ambulatory Visit: Payer: Self-pay

## 2023-05-31 ENCOUNTER — Emergency Department (HOSPITAL_COMMUNITY)
Admission: EM | Admit: 2023-05-31 | Discharge: 2023-05-31 | Discharge status: Against Medical Advice | Payer: Medicaid Other | Attending: Emergency Medicine | Admitting: Emergency Medicine

## 2023-05-31 ENCOUNTER — Encounter (HOSPITAL_COMMUNITY): Payer: Self-pay

## 2023-05-31 ENCOUNTER — Other Ambulatory Visit: Payer: Self-pay

## 2023-05-31 DIAGNOSIS — Z4800 Encounter for change or removal of nonsurgical wound dressing: Secondary | ICD-10-CM | POA: Insufficient documentation

## 2023-05-31 DIAGNOSIS — Z5321 Procedure and treatment not carried out due to patient leaving prior to being seen by health care provider: Secondary | ICD-10-CM | POA: Diagnosis not present

## 2023-05-31 DIAGNOSIS — Z8541 Personal history of malignant neoplasm of cervix uteri: Secondary | ICD-10-CM | POA: Insufficient documentation

## 2023-05-31 DIAGNOSIS — M79642 Pain in left hand: Secondary | ICD-10-CM | POA: Insufficient documentation

## 2023-05-31 NOTE — ED Triage Notes (Addendum)
Pt to ED from home, currently on chemo for cervical CA with mets to lungs stage 4, pt still having left hand pain with weeping and edema pt was seen on 9/22 for the same and rx benadryl and steroids, with no improvement. Pt says drainage is new sx. Pt says now has odor and worried for infection.

## 2023-05-31 NOTE — ED Notes (Signed)
Pt not in room, blanket left in chair

## 2023-06-01 ENCOUNTER — Emergency Department (HOSPITAL_COMMUNITY)
Admission: EM | Admit: 2023-06-01 | Discharge: 2023-06-01 | Disposition: A | Discharge status: Home / Self Care | Payer: Medicaid Other | Attending: Emergency Medicine | Admitting: Emergency Medicine

## 2023-06-01 ENCOUNTER — Encounter (HOSPITAL_COMMUNITY): Payer: Self-pay

## 2023-06-01 ENCOUNTER — Other Ambulatory Visit: Payer: Self-pay

## 2023-06-01 DIAGNOSIS — Z8541 Personal history of malignant neoplasm of cervix uteri: Secondary | ICD-10-CM | POA: Diagnosis not present

## 2023-06-01 DIAGNOSIS — I110 Hypertensive heart disease with heart failure: Secondary | ICD-10-CM | POA: Diagnosis not present

## 2023-06-01 DIAGNOSIS — J449 Chronic obstructive pulmonary disease, unspecified: Secondary | ICD-10-CM | POA: Insufficient documentation

## 2023-06-01 DIAGNOSIS — R000 Tachycardia, unspecified: Secondary | ICD-10-CM | POA: Diagnosis not present

## 2023-06-01 DIAGNOSIS — L232 Allergic contact dermatitis due to cosmetics: Secondary | ICD-10-CM | POA: Diagnosis not present

## 2023-06-01 DIAGNOSIS — Z79899 Other long term (current) drug therapy: Secondary | ICD-10-CM | POA: Diagnosis not present

## 2023-06-01 DIAGNOSIS — M7989 Other specified soft tissue disorders: Secondary | ICD-10-CM | POA: Diagnosis present

## 2023-06-01 DIAGNOSIS — L243 Irritant contact dermatitis due to cosmetics: Secondary | ICD-10-CM | POA: Insufficient documentation

## 2023-06-01 DIAGNOSIS — I509 Heart failure, unspecified: Secondary | ICD-10-CM | POA: Diagnosis not present

## 2023-06-01 DIAGNOSIS — Z23 Encounter for immunization: Secondary | ICD-10-CM | POA: Insufficient documentation

## 2023-06-01 DIAGNOSIS — D649 Anemia, unspecified: Secondary | ICD-10-CM | POA: Diagnosis not present

## 2023-06-01 LAB — COMPREHENSIVE METABOLIC PANEL
ALT: 25 U/L (ref 0–44)
AST: 43 U/L — ABNORMAL HIGH (ref 15–41)
Albumin: 3.9 g/dL (ref 3.5–5.0)
Alkaline Phosphatase: 58 U/L (ref 38–126)
Anion gap: 14 (ref 5–15)
BUN: 10 mg/dL (ref 6–20)
CO2: 19 mmol/L — ABNORMAL LOW (ref 22–32)
Calcium: 9.1 mg/dL (ref 8.9–10.3)
Chloride: 103 mmol/L (ref 98–111)
Creatinine, Ser: 0.96 mg/dL (ref 0.44–1.00)
GFR, Estimated: >60 mL/min (ref >60)
Glucose, Bld: 86 mg/dL (ref 70–99)
Potassium: 3.8 mmol/L (ref 3.5–5.1)
Sodium: 136 mmol/L (ref 135–145)
Total Bilirubin: 0.8 mg/dL (ref 0.3–1.2)
Total Protein: 8.1 g/dL (ref 6.5–8.1)

## 2023-06-01 LAB — POC OCCULT BLOOD, ED

## 2023-06-01 LAB — CBC WITH DIFFERENTIAL/PLATELET
Abs Immature Granulocytes: 0.01 10*3/uL (ref 0.00–0.07)
Basophils Absolute: 0 10*3/uL (ref 0.0–0.1)
Basophils Relative: 0 %
Eosinophils Absolute: 0 10*3/uL (ref 0.0–0.5)
Eosinophils Relative: 1 %
HCT: 20.6 % — ABNORMAL LOW (ref 36.0–46.0)
Hemoglobin: 6.8 g/dL — CL (ref 12.0–15.0)
Immature Granulocytes: 0 %
Lymphocytes Relative: 26 %
Lymphs Abs: 1.1 10*3/uL (ref 0.7–4.0)
MCH: 34.5 pg — ABNORMAL HIGH (ref 26.0–34.0)
MCHC: 33 g/dL (ref 30.0–36.0)
MCV: 104.6 fL — ABNORMAL HIGH (ref 80.0–100.0)
Monocytes Absolute: 0.6 10*3/uL (ref 0.1–1.0)
Monocytes Relative: 14 %
Neutro Abs: 2.6 10*3/uL (ref 1.7–7.7)
Neutrophils Relative %: 59 %
Platelets: 52 10*3/uL — ABNORMAL LOW (ref 150–400)
RBC: 1.97 MIL/uL — ABNORMAL LOW (ref 3.87–5.11)
RDW: 23.2 % — ABNORMAL HIGH (ref 11.5–15.5)
WBC: 4.3 10*3/uL (ref 4.0–10.5)
nRBC: 0 % (ref 0.0–0.2)

## 2023-06-01 LAB — MAGNESIUM: Magnesium: 1.8 mg/dL (ref 1.7–2.4)

## 2023-06-01 LAB — ABO/RH: ABO/RH(D): A POS

## 2023-06-01 LAB — PREPARE RBC (CROSSMATCH)

## 2023-06-01 MED ORDER — TETANUS-DIPHTH-ACELL PERTUSSIS 5-2.5-18.5 LF-MCG/0.5 IM SUSY
0.5000 mL | PREFILLED_SYRINGE | Freq: Once | INTRAMUSCULAR | Status: AC
  Administered 2023-06-01: 0.5 mL via INTRAMUSCULAR
  Filled 2023-06-01: qty 0.5

## 2023-06-01 MED ORDER — SODIUM CHLORIDE 0.9% IV SOLUTION: Freq: Once | INTRAVENOUS | Status: DC

## 2023-06-01 MED ORDER — OXYCODONE-ACETAMINOPHEN 5-325 MG PO TABS
2.0000 | ORAL_TABLET | Freq: Once | ORAL | Status: AC
  Administered 2023-06-01: 2 via ORAL

## 2023-06-01 MED ORDER — LACTATED RINGERS IV BOLUS (SEPSIS)
1000.0000 mL | Freq: Once | INTRAVENOUS | Status: AC
  Administered 2023-06-01: 1000 mL via INTRAVENOUS

## 2023-06-01 NOTE — ED Provider Notes (Signed)
[place] EMERGENCY DEPARTMENT AT [facility] Provider Note   CSN: [id number] Arrival date & time: 06/01/23  7829     History  Chief Complaint  Patient presents with   Allergic Reaction    MALAYSHA ARLEN is a 52 y.o. female.  HPI Patient presents for blistering to left hand.  Medical history includes GERD, cervical cancer, neuropathy, HTN, migraines, CHF, COPD, depression, anemia.  10 days ago, she got hair dye on her left hand.  She states that she was using gloves at the time.  She developed swelling and was seen in the ED 8 days ago.  She was prescribed steroids and antihistamine.  Her swelling has improved but she has developed some blisters.  These blisters are both itchy and painful.  She denies any systemic symptoms.  She denies any other areas of discomfort.    Home Medications Prior to Admission medications   Medication Sig Start Date End Date Taking? Authorizing Provider  albuterol (VENTOLIN HFA) 108 (90 Base) MCG/ACT inhaler Inhale 2 puffs into the lungs every 6 (six) hours as needed for wheezing or shortness of breath. 05/26/23   Billie Lade, MD  amLODipine (NORVASC) 10 MG tablet Take 1 tablet (10 mg total) by mouth daily. 04/03/23   Billie Lade, MD  Atezolizumab (TECENTRIQ IV) Inject into the vein every 21 ( twenty-one) days. 10/02/22   [provider]  Bevacizumab (AVASTIN IV) Inject into the vein every 21 ( twenty-one) days.    [provider]  calcium carbonate (TUMS) 500 MG chewable tablet Chew 1 tablet (200 mg of elemental calcium total) by mouth 3 (three) times daily with meals. 10/10/22   Carver Fila, MD  cetirizine (ZYRTEC ALLERGY) 10 MG tablet Take 1 tablet (10 mg total) by mouth daily. 05/24/23   Carmel Sacramento A, PA-C  DULoxetine (CYMBALTA) 60 MG capsule Take 1 capsule (60 mg total) by mouth daily. 12/24/22   Doreatha Massed, MD  HYDROcodone-acetaminophen (NORCO/VICODIN) 5-325 MG tablet Take 1 tablet by mouth every 6  (six) hours as needed for moderate pain. Patient not taking: Reported on 05/26/2023 04/27/23   Doreatha Massed, MD  hydrOXYzine (ATARAX) 10 MG tablet Take 1 tablet (10 mg total) by mouth 3 (three) times daily as needed for itching. 04/02/23   Doreatha Massed, MD  lidocaine-prilocaine (EMLA) cream Apply a dime-sized amount to port a cath site and cover with plastic wrap 1 hour prior to infusion appointments 09/25/22   Doreatha Massed, MD  loperamide (IMODIUM) 2 MG capsule Take 1 capsule (2 mg total) by mouth as needed for diarrhea or loose stools (Take 2 capsules after the first loose stool and 1 capsule after each loose stool thereafter.  DO NOT EXCEED 8 capsules a day.). 03/12/23   Doreatha Massed, MD  nicotine polacrilex (NICORETTE) 4 MG gum Take 1 each (4 mg total) by mouth as needed for smoking cessation. 05/26/23   Billie Lade, MD  ondansetron (ZOFRAN) 8 MG tablet Take 1 tablet (8 mg total) by mouth every 8 (eight) hours as needed for nausea or vomiting. Start on the third day after cisplatin. 09/25/22   Doreatha Massed, MD  PACLITAXEL IV Inject into the vein every 21 ( twenty-one) days.    [provider]  pantoprazole (PROTONIX) 40 MG tablet Take 1 tablet (40 mg total) by mouth daily. 05/26/23   Billie Lade, MD  pregabalin (LYRICA) 200 MG capsule Take 1 capsule (200 mg total) by mouth 3 (three) times  daily. 04/30/23   Doreatha Massed, MD  prochlorperazine (COMPAZINE) 10 MG tablet Take 1 tablet (10 mg total) by mouth every 6 (six) hours as needed (Nausea or vomiting). 09/25/22   Doreatha Massed, MD  triamcinolone cream (KENALOG) 0.1 % Apply 1 Application topically 2 (two) times daily. 11/18/22   Billie Lade, MD  famotidine (PEPCID) 20 MG tablet Take 1 tablet (20 mg total) by mouth 2 (two) times daily. Patient not taking: Reported on 01/12/2018 12/18/17 02/02/20  Devoria Albe, MD  hydrochlorothiazide (HYDRODIURIL) 25 MG tablet Take 1 tablet (25 mg total) by  mouth daily. 06/17/19 02/02/20  Devoria Albe, MD      Allergies    Carrot [daucus carota], Lisinopril-hydrochlorothiazide, Ibuprofen, Tramadol, and Erythromycin    Review of Systems   Review of Systems  Skin:  Positive for wound.  All other systems reviewed and are negative.   Physical Exam Updated Vital Signs BP 136/76   Pulse 95   Temp 98.4 F (36.9 C) (Oral)   Resp (!) 21   Ht 5\' 3"  (1.6 m)   Wt 73.9 kg   LMP 08/15/2022 (Approximate)   SpO2 97%   BMI 28.86 kg/m  Physical Exam Vitals and nursing note reviewed.  Constitutional:      General: She is not in acute distress.    Appearance: Normal appearance. She is well-developed. She is not ill-appearing, toxic-appearing or diaphoretic.  HENT:     Head: Normocephalic and atraumatic.     Right Ear: External ear normal.     Left Ear: External ear normal.     Nose: Nose normal.     Mouth/Throat:     Mouth: Mucous membranes are moist.  Eyes:     Extraocular Movements: Extraocular movements intact.     Conjunctiva/sclera: Conjunctivae normal.  Cardiovascular:     Rate and Rhythm: Normal rate and regular rhythm.  Pulmonary:     Effort: Pulmonary effort is normal. No respiratory distress.  Abdominal:     General: There is no distension.     Palpations: Abdomen is soft.     Tenderness: There is no abdominal tenderness.  Musculoskeletal:        General: No deformity.     Cervical back: Normal range of motion and neck supple.  Skin:    General: Skin is warm and dry.     Comments: Open blisters to second and third digits of left hand.  Neurological:     General: No focal deficit present.     Mental Status: She is alert and oriented to person, place, and time.  Psychiatric:        Mood and Affect: Mood normal.        Behavior: Behavior normal.        ED Results / Procedures / Treatments   Labs (all labs ordered are listed, but only abnormal results are displayed) Labs Reviewed  COMPREHENSIVE METABOLIC PANEL -  Abnormal; Notable for the following components:      Result Value   CO2 19 (*)    AST 43 (*)    All other components within normal limits  CBC WITH DIFFERENTIAL/PLATELET - Abnormal; Notable for the following components:   RBC 1.97 (*)    Hemoglobin 6.8 (*)    HCT 20.6 (*)    MCV 104.6 (*)    MCH 34.5 (*)    RDW 23.2 (*)    Platelets 52 (*)    All other components within normal limits  POC OCCULT BLOOD,  ED - Normal  MAGNESIUM  CBC WITH DIFFERENTIAL/PLATELET  PREPARE RBC (CROSSMATCH)  TYPE AND SCREEN  ABO/RH    EKG EKG Interpretation Date/Time:  Monday June 01 2023 08:58:48 EDT Ventricular Rate:  109 PR Interval:  156 QRS Duration:  77 QT Interval:  325 QTC Calculation: 438 R Axis:   35  Text Interpretation: Sinus tachycardia Confirmed by Gloris Manchester ([id number]) on 06/01/2023 9:52:05 AM  Radiology No results found.  Procedures Procedures    Medications Ordered in ED Medications  0.9 %  sodium chloride infusion (Manually program via Guardrails IV Fluids) (has no administration in time range)  Tdap (BOOSTRIX) injection 0.5 mL (has no administration in time range)  lactated ringers bolus 1,000 mL (0 mLs Intravenous Stopped 06/01/23 0946)  oxyCODONE-acetaminophen (PERCOCET/ROXICET) 5-325 MG per tablet 2 tablet (2 tablets Oral Given 06/01/23 1243)    ED Course/ Medical Decision Making/ A&P                                 Medical Decision Making Amount and/or Complexity of Data Reviewed Labs: ordered. ECG/medicine tests: ordered.  Risk Prescription drug management.   This patient presents to the ED for concern of left hand blisters, this involves an extensive number of treatment options, and is a complaint that carries with it a high risk of complications and morbidity.  The differential diagnosis includes chemical burn, contact dermatitis, resolution of swelling, infection, medication side effect   Co morbidities that complicate the patient evaluation  GERD,  cervical cancer, neuropathy, HTN, migraines, CHF, COPD, depression, anemia   Additional history obtained:  Additional history obtained from N/A External records from outside source obtained and reviewed including EMR   Lab Tests:  I Ordered, and personally interpreted labs.  The pertinent results include: Acute on chronic anemia, mild non-anion gap metabolic acidosis, lab results are otherwise unremarkable  Cardiac Monitoring: / EKG:  The patient was maintained on a cardiac monitor.  I personally viewed and interpreted the cardiac monitored which showed an underlying rhythm of: Sinus rhythm   Problem List / ED Course / Critical interventions / Medication management  Patient presents for blisters to second and third digits of left hand.  She was seen in the ED a week ago following exposure to hair dye.  At the time, she had swelling and did have to get her wedding ring removed.  The swelling has resolved but she has since developed some blistering.  These areas remain pruritic.  She endorses some mild soreness to the areas.  Although she denies any systemic symptoms, she does arrive tachycardic.  Will check EKG and basic lab work.  IV fluids were ordered.  Lab work was notable for acute on chronic anemia.  No leukocytosis was present.  Hemoccult testing was negative.  Anemia likely explains, at least in part, her tachycardia.  She was agreeable to blood transfusion.  1 unit of irradiated PRBCs was ordered given her ongoing chemotherapy and concern of immunocompromise.  Patient's hand blisters were dressed in Xeroform and Kerlix.  I do not suspect infection at this time.  Patient was advised to keep these areas clean and dry.  She was offered admission but declined.  She states that she would like to get blood transfused and go home.  I feel this is reasonable.  She did have resolution of her tachycardia while in the ED.  Care of patient was signed out to oncoming ED  provider. I ordered medication  including PRBCs for symptomatic anemia; Percocet for analgesia; Tdap for tetanus prophylaxis Reevaluation of the patient after these medicines showed that the patient improved I have reviewed the patients home medicines and have made adjustments as needed   Social Determinants of Health:  Has access to outpatient care  CRITICAL CARE Performed by: Gloris Manchester   Total critical care time: 32 minutes  Critical care time was exclusive of separately billable procedures and treating other patients.  Critical care was necessary to treat or prevent imminent or life-threatening deterioration.  Critical care was time spent personally by me on the following activities: development of treatment plan with patient and/or surrogate as well as nursing, discussions with consultants, evaluation of patient's response to treatment, examination of patient, obtaining history from patient or surrogate, ordering and performing treatments and interventions, ordering and review of laboratory studies, ordering and review of radiographic studies, pulse oximetry and re-evaluation of patient's condition.         Final Clinical Impression(s) / ED Diagnoses Final diagnoses:  Symptomatic anemia  Irritant contact dermatitis due to cosmetics    Rx / DC Orders ED Discharge Orders     None         Gloris Manchester, MD 06/01/23 1418

## 2023-06-01 NOTE — Discharge Instructions (Signed)
Keep your hand wounds clean and dry.  Return to the emergency department for any new or worsening symptoms of concern.

## 2023-06-01 NOTE — ED Triage Notes (Signed)
Pt comes to ED with blisters and swelling to left hand. Pt states she put hair dye on someone over a week ago and noticed her hand getting swollen, draining, and blisters to hand.

## 2023-06-02 ENCOUNTER — Inpatient Hospital Stay: Payer: Medicaid Other

## 2023-06-02 ENCOUNTER — Inpatient Hospital Stay: Payer: Medicaid Other | Admitting: Hematology

## 2023-06-02 DIAGNOSIS — Z419 Encounter for procedure for purposes other than remedying health state, unspecified: Secondary | ICD-10-CM | POA: Diagnosis not present

## 2023-06-02 LAB — TYPE AND SCREEN
ABO/RH(D): A POS
Antibody Screen: NEGATIVE
Unit division: 0

## 2023-06-02 LAB — BPAM RBC
Blood Product Expiration Date: 202410222359
ISSUE DATE / TIME: 202409301750
Unit Type and Rh: 6200

## 2023-06-04 ENCOUNTER — Other Ambulatory Visit: Payer: Self-pay | Admitting: *Deleted

## 2023-06-04 DIAGNOSIS — C538 Malignant neoplasm of overlapping sites of cervix uteri: Secondary | ICD-10-CM

## 2023-06-04 DIAGNOSIS — G893 Neoplasm related pain (acute) (chronic): Secondary | ICD-10-CM

## 2023-06-04 DIAGNOSIS — G622 Polyneuropathy due to other toxic agents: Secondary | ICD-10-CM

## 2023-06-04 DIAGNOSIS — Z95828 Presence of other vascular implants and grafts: Secondary | ICD-10-CM

## 2023-06-04 MED ORDER — PROCHLORPERAZINE MALEATE 10 MG PO TABS
10.0000 mg | ORAL_TABLET | Freq: Four times a day (QID) | ORAL | 1 refills | Status: DC | PRN
Start: 2023-06-04 — End: 2023-11-03

## 2023-06-04 MED ORDER — ONDANSETRON HCL 8 MG PO TABS: 8.0000 mg | ORAL_TABLET | Freq: Three times a day (TID) | ORAL | 1 refills | Status: DC | PRN

## 2023-06-04 MED ORDER — HYDROCODONE-ACETAMINOPHEN 5-325 MG PO TABS: 1.0000 | ORAL_TABLET | Freq: Four times a day (QID) | ORAL | 0 refills | Status: DC | PRN

## 2023-06-04 MED ORDER — LIDOCAINE-PRILOCAINE 2.5-2.5 % EX CREA
TOPICAL_CREAM | CUTANEOUS | 3 refills | Status: DC
Start: 2023-06-04 — End: 2023-11-03

## 2023-06-04 MED ORDER — PREGABALIN 200 MG PO CAPS: 200.0000 mg | ORAL_CAPSULE | Freq: Three times a day (TID) | ORAL | 0 refills | Status: DC

## 2023-06-08 NOTE — Progress Notes (Signed)
[facility] [address], [name]    Clinic Day:  06/09/23     Referring physician: Billie Lade, MD  Patient Care Team: Billie Lade, MD as PCP - General (Internal Medicine) Doreatha Massed, MD as Medical Oncologist (Medical Oncology) Therese Sarah, RN as Oncology Nurse Navigator (Medical Oncology)   ASSESSMENT & PLAN:   Assessment: 1.  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 - CTAP (08/30/2022): Irregularity of the endometrium extending through the cervix with foci of gas in the cervix.  Multifocal omental and peritoneal metastasis.  Metastatic left periaortic, aortocaval, left external iliac and right common iliac lymph nodes.  Multiple bilateral lung nodules at bases.  12 mm hypodensity in the liver near the falciform ligament. - Endometrial biopsy (09/09/2022): Invasive moderately to poorly differentiated squamous cell carcinoma with abundant necrosis.  Cervix biopsy: Invasive and in situ moderate to poorly differentiated squamous cell carcinoma.  High risk HPV positive. - NGS: PD-L1 (22 C3): CPS 2, HER2 IHC negative.  PIK3CA exon 10 pathogenic variant.  MS-stable.  TMB-low.  FANCM and SDHA pathogenic variants. - PD-L1 CPS: 8% - Cycle 1 of cisplatin, paclitaxel, bevacizumab on 10/02/2022, pembrolizumab added with cycle 2 on 10/23/2022 - Bone scan (10/14/2022): No metastatic disease   2.  Social/family history: - She is married and lives at home with her husband and roommate.  She has never worked.  She is current active smoker, half pack per day, for the last 28 years.  She occasionally smokes marijuana. - Mother died of lung cancer.  Father had head and neck cancer.  Maternal grandmother had cancer.  Paternal uncle died of cancer.  Maternal uncle also had cancer.     Plan: 1.  Metastatic cervical cancer to the lungs, peritoneum and liver: - CT CAP on 05/08/2023: Decrease size of retroperitoneal lymph nodes, now subcentimeter with no persistent peritoneal nodularity. - Labs today: Platelets are 69,000.  White count is 3.6 with ANC of 1.3.  Hemoglobin 8.3.  Creatinine 1.04. - Will hold her treatment today.  Will recheck her counts.  If her platelet count improved above 100 and ANC more than 1.5, we will proceed with treatment.  RTC 4 weeks for follow-up.   2.  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  Last blood transfusion was on 06/01/2023 for hemoglobin 6.8.  Hemoglobin today is 8.3.   3.  Peripheral neuropathy: - Neuropathic pains have improved.  Continue gabapentin 600 mg 3 times daily and hydrocodone 5/325 every 6 hours as needed.   4.  Hypertension: - Continue amlodipine 10 mg daily.  Blood pressure is 129/80.        Orders Placed This Encounter  Procedures   Comprehensive metabolic panel    Standing Status:   Future    Standing Expiration Date:   06/28/2024   Magnesium    Standing Status:   Future    Standing Expiration Date:   06/28/2024   UA Protein, Dipstick - [facility]    Standing Status:   Future    Standing Expiration Date:   06/28/2024   CBC with Differential    Standing Status:   Future    Standing Expiration Date:   06/28/2024   Comprehensive metabolic panel    Standing Status:   Future    Standing Expiration Date:  07/19/2024   Magnesium    Standing Status:   Future    Standing Expiration Date:   07/19/2024   UA Protein, Dipstick - [facility]    Standing Status:   Future    Standing Expiration Date:   07/19/2024   CBC with Differential    Standing Status:   Future    Standing Expiration Date:   07/19/2024   Comprehensive metabolic panel    Standing Status:   Future    Standing Expiration Date:   08/09/2024   Magnesium    Standing Status:   Future    Standing Expiration Date:   08/09/2024   UA Protein, Dipstick -  [facility]    Standing Status:   Future    Standing Expiration Date:   08/09/2024   CBC with Differential    Standing Status:   Future    Standing Expiration Date:   08/09/2024   Comprehensive metabolic panel    Standing Status:   Future    Standing Expiration Date:   08/30/2024   Magnesium    Standing Status:   Future    Standing Expiration Date:   08/30/2024   UA Protein, Dipstick - [facility]    Standing Status:   Future    Standing Expiration Date:   08/30/2024   CBC with Differential    Standing Status:   Future    Standing Expiration Date:   08/30/2024     Mikeal Hawthorne R Teague,acting as a scribe for Doreatha Massed, MD.,have documented all relevant documentation on the behalf of Doreatha Massed, MD,as directed by  Doreatha Massed, MD while in the presence of Doreatha Massed, MD.  I, Doreatha Massed MD, have reviewed the above documentation for accuracy and completeness, and I agree with the above.       Doreatha Massed, MD   10/8/20246:04 PM  CHIEF COMPLAINT:   Diagnosis: metastatic cervical squamous cell carcinoma    Cancer Staging  Cervical cancer [facility]) Staging form: Cervix Uteri, AJCC Version 9 - Clinical stage from 09/19/2022: FIGO Stage IVB (cT4, cN2a, cM1) - Unsigned    Prior Therapy: none  Current Therapy:  Cisplatin + Paclitaxel + Bevacizumab, Keytruda   HISTORY OF PRESENT ILLNESS:   Oncology History Overview Note  PD-L1 CPS 8%   Cervical cancer (HCC)  09/19/2022 Initial Diagnosis   Cervical cancer (HCC)   10/02/2022 -  Chemotherapy   [name]        INTERVAL HISTORY:   Kristin Carson is a 52 y.o. female presenting to clinic today for follow up of metastatic cervical squamous cell carcinoma. She was last seen by me on 05/12/23.  Since her last visit, she presented to the ED on 05/24/23 for an allergic reaction to hair dye, including  swelling to hands and rash to bilateral arms, that had caused a tight ring on her finger. The ring was cut off with trauma shears and no injury to her finger was noted. She was given steroids and antihistamines for home. She presented to the ED again on 06/01/23 for blistering of the left hand second and third digits. She was given 1 unit of irradiated PRBCs for symptomatic anemia. Blisters were dressed in Xeroform and Kerlix.   Today, she states that she is doing well overall. Her appetite level is at 40%. Her energy level is at 75%.   She reports tingling and pain in feet have  improved since her last visit. She is taking Gabapentin and amlodipine as prescribed. She would like to have medication that increases appetite. She denies any bleeding issues including, BRBPR, epistaxis, or gum bleeding. Since being given blood transfusion, she has noticed an increase in strength and energy. She denies any nausea, vomiting, diarrhea, or constipation.  PAST MEDICAL HISTORY:   Past Medical History: Past Medical History:  Diagnosis Date   Anemia    Asthma    Bronchitis    Cancer (HCC)    Cervical   CHF (congestive heart failure) (HCC)    COPD (chronic obstructive pulmonary disease) (HCC)    Depression    GERD (gastroesophageal reflux disease)    Headache    Hypertension    Port-A-Cath in place 09/25/2022    Surgical History: Past Surgical History:  Procedure Laterality Date   BREAST SURGERY     CESAREAN SECTION     IR IMAGING GUIDED PORT INSERTION  09/30/2022   LEG SURGERY      Social History: Social History   Socioeconomic History   Marital status: Married    Spouse name: Not on file   Number of children: Not on file   Years of education: Not on file   Highest education level: Not on file  Occupational History   Not on file  Tobacco Use   Smoking status: Every Day    Current packs/day: 0.25    Average packs/day: 0.3 packs/day for 23.0 years (5.8 ttl pk-yrs)    Types: Cigarettes    Smokeless tobacco: Never  Vaping Use   Vaping status: Never Used  Substance and Sexual Activity   Alcohol use: Yes    Comment: occ   Drug use: Yes    Types: Marijuana   Sexual activity: Yes    Birth control/protection: None  Other Topics Concern   Not on file  Social History Narrative   Not on file   Social Determinants of Health   Financial Resource Strain: Low Risk  (09/09/2022)   Overall Financial Resource Strain (CARDIA)    Difficulty of Paying Living Expenses: Not hard at all  Food Insecurity: No Food Insecurity (09/22/2022)   Hunger Vital Sign    Worried About Running Out of Food in the Last Year: Never true    Ran Out of Food in the Last Year: Never true  Transportation Needs: No Transportation Needs (09/22/2022)   PRAPARE - Administrator, Civil Service (Medical): No    Lack of Transportation (Non-Medical): No  Recent Concern: Transportation Needs - Unmet Transportation Needs (09/09/2022)   PRAPARE - Transportation    Lack of Transportation (Medical): Yes    Lack of Transportation (Non-Medical): Yes  Physical Activity: Insufficiently Active (09/09/2022)   Exercise Vital Sign    Days of Exercise per Week: 2 days    Minutes of Exercise per Session: 20 min  Stress: No Stress Concern Present (09/09/2022)   [employer] of Occupational Health - Occupational Stress Questionnaire    Feeling of Stress : Only a little  Social Connections: Socially Isolated (09/09/2022)   Social Connection and Isolation Panel [NHANES]    Frequency of Communication with Friends and Family: Three times a week    Frequency of Social Gatherings with Friends and Family: More than three times a week    Attends Religious Services: Never    Database administrator or Organizations: No    Attends Banker Meetings: Never    Marital Status: Separated  Intimate  Partner Violence: Not At Risk (09/22/2022)   Humiliation, Afraid, Rape, and Kick questionnaire    Fear of Current or  Ex-Partner: No    Emotionally Abused: No    Physically Abused: No    Sexually Abused: No    Family History: Family History  Problem Relation Age of Onset   Dermatomyositis Mother    Hypertension Mother    Cancer Mother        lung   Cancer Father        lung   Heart disease Father    Lung cancer Paternal Uncle    Lung cancer Maternal Uncle    Colon cancer Neg Hx    Breast cancer Neg Hx    Ovarian cancer Neg Hx    Endometrial cancer Neg Hx    Pancreatic cancer Neg Hx    Prostate cancer Neg Hx     Current Medications:  Current Outpatient Medications:    albuterol (VENTOLIN HFA) 108 (90 Base) MCG/ACT inhaler, Inhale 2 puffs into the lungs every 6 (six) hours as needed for wheezing or shortness of breath., Disp: 8 g, Rfl: 2   amLODipine (NORVASC) 10 MG tablet, Take 1 tablet (10 mg total) by mouth daily., Disp: 30 tablet, Rfl: 2   Atezolizumab (TECENTRIQ IV), Inject into the vein every 21 ( twenty-one) days., Disp: , Rfl:    Bevacizumab (AVASTIN IV), Inject into the vein every 21 ( twenty-one) days., Disp: , Rfl:    cetirizine (ZYRTEC ALLERGY) 10 MG tablet, Take 1 tablet (10 mg total) by mouth daily., Disp: 5 tablet, Rfl: 0   HYDROcodone-acetaminophen (NORCO/VICODIN) 5-325 MG tablet, Take 1 tablet by mouth every 6 (six) hours as needed for moderate pain., Disp: 120 tablet, Rfl: 0   lidocaine-prilocaine (EMLA) cream, Apply a dime-sized amount to port a cath site and cover with plastic wrap 1 hour prior to infusion appointments, Disp: 30 g, Rfl: 3   loperamide (IMODIUM) 2 MG capsule, Take 1 capsule (2 mg total) by mouth as needed for diarrhea or loose stools (Take 2 capsules after the first loose stool and 1 capsule after each loose stool thereafter.  DO NOT EXCEED 8 capsules a day.)., Disp: 30 capsule, Rfl: 0   megestrol (MEGACE) 400 MG/10ML suspension, Take 10 mLs (400 mg total) by mouth 2 (two) times daily., Disp: 480 mL, Rfl: 3   naproxen sodium (ALEVE) 220 MG tablet, Take 220 mg  by mouth daily as needed (pain)., Disp: , Rfl:    nicotine polacrilex (NICORETTE) 4 MG gum, Take 1 each (4 mg total) by mouth as needed for smoking cessation., Disp: 100 tablet, Rfl: 0   ondansetron (ZOFRAN) 8 MG tablet, Take 1 tablet (8 mg total) by mouth every 8 (eight) hours as needed for nausea or vomiting. Start on the third day after cisplatin., Disp: 30 tablet, Rfl: 1   pantoprazole (PROTONIX) 40 MG tablet, Take 1 tablet (40 mg total) by mouth daily., Disp: 90 tablet, Rfl: 1   pregabalin (LYRICA) 200 MG capsule, Take 1 capsule (200 mg total) by mouth 3 (three) times daily., Disp: 180 capsule, Rfl: 0   prochlorperazine (COMPAZINE) 10 MG tablet, Take 1 tablet (10 mg total) by mouth every 6 (six) hours as needed (Nausea or vomiting)., Disp: 30 tablet, Rfl: 1   Allergies: Allergies  Allergen Reactions   Carrot [Daucus Carota] Hives   Lisinopril-Hydrochlorothiazide     Oral swelling   Ibuprofen Other (See Comments)    Oral swelling   Other Itching  and Other (See Comments)    Hair dye, blisters, pus-filled, soreness   Tramadol Hives   Erythromycin Hives    REVIEW OF SYSTEMS:   Review of Systems  Constitutional:  Negative for chills, fatigue and fever.  HENT:   Negative for lump/mass, mouth sores, nosebleeds, sore throat and trouble swallowing.        +decreased tatste  Eyes:  Negative for eye problems.  Respiratory:  Negative for cough and shortness of breath.   Cardiovascular:  Negative for chest pain, leg swelling and palpitations.  Gastrointestinal:  Negative for abdominal pain, constipation, diarrhea, nausea and vomiting.  Genitourinary:  Negative for bladder incontinence, difficulty urinating, dysuria, frequency, hematuria and nocturia.   Musculoskeletal:  Negative for arthralgias, back pain, flank pain, myalgias and neck pain.  Skin:  Negative for itching and rash.  Neurological:  Negative for dizziness, headaches and numbness.       +tingling in feet, 6/10 pain severity   Hematological:  Does not bruise/bleed easily.  Psychiatric/Behavioral:  Negative for depression, sleep disturbance and suicidal ideas. The patient is not nervous/anxious.   All other systems reviewed and are negative.    VITALS:   Last menstrual period 08/15/2022.  Wt Readings from Last 3 Encounters:  06/09/23 164 lb 9.6 oz (74.7 kg)  06/01/23 162 lb 14.7 oz (73.9 kg)  05/31/23 162 lb 14.7 oz (73.9 kg)    There is no height or weight on file to calculate BMI.  Performance status (ECOG): 1 - Symptomatic but completely ambulatory  PHYSICAL EXAM:   Physical Exam Vitals and nursing note reviewed. Exam conducted with a chaperone present.  Constitutional:      Appearance: Normal appearance.  Cardiovascular:     Rate and Rhythm: Normal rate and regular rhythm.     Pulses: Normal pulses.     Heart sounds: Normal heart sounds.  Pulmonary:     Effort: Pulmonary effort is normal.     Breath sounds: Normal breath sounds.  Abdominal:     Palpations: Abdomen is soft. There is no hepatomegaly, splenomegaly or mass.     Tenderness: There is no abdominal tenderness.  Musculoskeletal:     Right lower leg: No edema.     Left lower leg: No edema.  Lymphadenopathy:     Cervical: No cervical adenopathy.     Right cervical: No superficial, deep or posterior cervical adenopathy.    Left cervical: No superficial, deep or posterior cervical adenopathy.     Upper Body:     Right upper body: No supraclavicular or axillary adenopathy.     Left upper body: No supraclavicular or axillary adenopathy.  Neurological:     General: No focal deficit present.     Mental Status: She is alert and oriented to person, place, and time.  Psychiatric:        Mood and Affect: Mood normal.        Behavior: Behavior normal.     LABS:      Latest Ref Rng & Units 06/09/2023    9:07 AM 06/01/2023    9:36 AM 05/18/2023   10:22 AM  CBC  WBC 4.0 - 10.5 K/uL 3.6  4.3  4.2   Hemoglobin 12.0 - 15.0 g/dL 8.3  6.8   7.7   Hematocrit 36.0 - 46.0 % 25.9  20.6  23.5   Platelets 150 - 400 K/uL 69  52  131       Latest Ref Rng & Units 06/09/2023    9:07  AM 06/01/2023    9:09 AM 05/18/2023   10:22 AM  CMP  Glucose 70 - 99 mg/dL 604  86  540   BUN 6 - 20 mg/dL 12  10  10    Creatinine 0.44 - 1.00 mg/dL 9.81  1.91  4.78   Sodium 135 - 145 mmol/L 135  136  135   Potassium 3.5 - 5.1 mmol/L 3.8  3.8  3.7   Chloride 98 - 111 mmol/L 101  103  102   CO2 22 - 32 mmol/L 23  19  22    Calcium 8.9 - 10.3 mg/dL 8.7  9.1  8.7   Total Protein 6.5 - 8.1 g/dL 7.5  8.1  6.7   Total Bilirubin 0.3 - 1.2 mg/dL 0.3  0.8  0.4   Alkaline Phos 38 - 126 U/L 51  58  47   AST 15 - 41 U/L 28  43  23   ALT 0 - 44 U/L 17  25  14       No results found for: "CEA1", "CEA" / No results found for: "CEA1", "CEA" No results found for: "PSA1" No results found for: "GNF621" No results found for: "CAN125"  No results found for: "TOTALPROTELP", "ALBUMINELP", "A1GS", "A2GS", "BETS", "BETA2SER", "GAMS", "MSPIKE", "SPEI" Lab Results  Component Value Date   TIBC 412 09/22/2022   FERRITIN 35 09/22/2022   IRONPCTSAT 7 (L) 09/22/2022   No results found for: "LDH"   STUDIES:   No results found.

## 2023-06-09 ENCOUNTER — Inpatient Hospital Stay (HOSPITAL_BASED_OUTPATIENT_CLINIC_OR_DEPARTMENT_OTHER): Payer: Medicaid Other | Admitting: Hematology

## 2023-06-09 ENCOUNTER — Inpatient Hospital Stay: Payer: Medicaid Other | Attending: Hematology

## 2023-06-09 ENCOUNTER — Inpatient Hospital Stay: Payer: Medicaid Other

## 2023-06-09 DIAGNOSIS — C538 Malignant neoplasm of overlapping sites of cervix uteri: Secondary | ICD-10-CM

## 2023-06-09 DIAGNOSIS — C786 Secondary malignant neoplasm of retroperitoneum and peritoneum: Secondary | ICD-10-CM | POA: Insufficient documentation

## 2023-06-09 DIAGNOSIS — Z7689 Persons encountering health services in other specified circumstances: Secondary | ICD-10-CM | POA: Diagnosis not present

## 2023-06-09 DIAGNOSIS — G629 Polyneuropathy, unspecified: Secondary | ICD-10-CM | POA: Diagnosis not present

## 2023-06-09 DIAGNOSIS — F17210 Nicotine dependence, cigarettes, uncomplicated: Secondary | ICD-10-CM | POA: Diagnosis not present

## 2023-06-09 DIAGNOSIS — C539 Malignant neoplasm of cervix uteri, unspecified: Secondary | ICD-10-CM | POA: Diagnosis not present

## 2023-06-09 DIAGNOSIS — D649 Anemia, unspecified: Secondary | ICD-10-CM | POA: Insufficient documentation

## 2023-06-09 DIAGNOSIS — Z95828 Presence of other vascular implants and grafts: Secondary | ICD-10-CM | POA: Diagnosis not present

## 2023-06-09 DIAGNOSIS — C787 Secondary malignant neoplasm of liver and intrahepatic bile duct: Secondary | ICD-10-CM | POA: Diagnosis not present

## 2023-06-09 DIAGNOSIS — Z5112 Encounter for antineoplastic immunotherapy: Secondary | ICD-10-CM | POA: Insufficient documentation

## 2023-06-09 DIAGNOSIS — Z5111 Encounter for antineoplastic chemotherapy: Secondary | ICD-10-CM | POA: Insufficient documentation

## 2023-06-09 DIAGNOSIS — Z801 Family history of malignant neoplasm of trachea, bronchus and lung: Secondary | ICD-10-CM | POA: Insufficient documentation

## 2023-06-09 DIAGNOSIS — Z808 Family history of malignant neoplasm of other organs or systems: Secondary | ICD-10-CM | POA: Diagnosis not present

## 2023-06-09 DIAGNOSIS — C7801 Secondary malignant neoplasm of right lung: Secondary | ICD-10-CM | POA: Insufficient documentation

## 2023-06-09 DIAGNOSIS — I10 Essential (primary) hypertension: Secondary | ICD-10-CM | POA: Diagnosis not present

## 2023-06-09 DIAGNOSIS — C7802 Secondary malignant neoplasm of left lung: Secondary | ICD-10-CM | POA: Diagnosis not present

## 2023-06-09 LAB — URINALYSIS, DIPSTICK ONLY
Glucose, UA: NEGATIVE mg/dL
Hgb urine dipstick: NEGATIVE
Ketones, ur: 5 mg/dL — AB
Leukocytes,Ua: NEGATIVE
Nitrite: NEGATIVE
Protein, ur: 30 mg/dL — AB
Specific Gravity, Urine: 1.021 (ref 1.005–1.030)
pH: 5 (ref 5.0–8.0)

## 2023-06-09 LAB — CBC WITH DIFFERENTIAL/PLATELET
Abs Immature Granulocytes: 0.01 10*3/uL (ref 0.00–0.07)
Basophils Absolute: 0 10*3/uL (ref 0.0–0.1)
Basophils Relative: 0 %
Eosinophils Absolute: 0.1 10*3/uL (ref 0.0–0.5)
Eosinophils Relative: 2 %
HCT: 25.9 % — ABNORMAL LOW (ref 36.0–46.0)
Hemoglobin: 8.3 g/dL — ABNORMAL LOW (ref 12.0–15.0)
Immature Granulocytes: 0 %
Lymphocytes Relative: 45 %
Lymphs Abs: 1.6 10*3/uL (ref 0.7–4.0)
MCH: 33.7 pg (ref 26.0–34.0)
MCHC: 32 g/dL (ref 30.0–36.0)
MCV: 105.3 fL — ABNORMAL HIGH (ref 80.0–100.0)
Monocytes Absolute: 0.6 10*3/uL (ref 0.1–1.0)
Monocytes Relative: 16 %
Neutro Abs: 1.3 10*3/uL — ABNORMAL LOW (ref 1.7–7.7)
Neutrophils Relative %: 37 %
Platelets: 69 10*3/uL — ABNORMAL LOW (ref 150–400)
RBC: 2.46 MIL/uL — ABNORMAL LOW (ref 3.87–5.11)
RDW: 22.7 % — ABNORMAL HIGH (ref 11.5–15.5)
WBC: 3.6 10*3/uL — ABNORMAL LOW (ref 4.0–10.5)
nRBC: 0 % (ref 0.0–0.2)

## 2023-06-09 LAB — COMPREHENSIVE METABOLIC PANEL
ALT: 17 U/L (ref 0–44)
AST: 28 U/L (ref 15–41)
Albumin: 3.4 g/dL — ABNORMAL LOW (ref 3.5–5.0)
Alkaline Phosphatase: 51 U/L (ref 38–126)
Anion gap: 11 (ref 5–15)
BUN: 12 mg/dL (ref 6–20)
CO2: 23 mmol/L (ref 22–32)
Calcium: 8.7 mg/dL — ABNORMAL LOW (ref 8.9–10.3)
Chloride: 101 mmol/L (ref 98–111)
Creatinine, Ser: 1.04 mg/dL — ABNORMAL HIGH (ref 0.44–1.00)
GFR, Estimated: >60 mL/min (ref >60)
Glucose, Bld: 103 mg/dL — ABNORMAL HIGH (ref 70–99)
Potassium: 3.8 mmol/L (ref 3.5–5.1)
Sodium: 135 mmol/L (ref 135–145)
Total Bilirubin: 0.3 mg/dL (ref 0.3–1.2)
Total Protein: 7.5 g/dL (ref 6.5–8.1)

## 2023-06-09 LAB — MAGNESIUM: Magnesium: 1.7 mg/dL (ref 1.7–2.4)

## 2023-06-09 MED ORDER — HEPARIN SOD (PORK) LOCK FLUSH 100 UNIT/ML IV SOLN
500.0000 [IU] | Freq: Once | INTRAVENOUS | Status: AC
  Administered 2023-06-09: 500 [IU] via INTRAVENOUS

## 2023-06-09 MED ORDER — MEGESTROL ACETATE 400 MG/10ML PO SUSP: 400.0000 mg | Freq: Two times a day (BID) | ORAL | 3 refills | Status: DC

## 2023-06-09 MED ORDER — SODIUM CHLORIDE 0.9% FLUSH
10.0000 mL | Freq: Once | INTRAVENOUS | Status: AC
  Administered 2023-06-09: 10 mL via INTRAVENOUS

## 2023-06-09 NOTE — Patient Instructions (Signed)

## 2023-06-09 NOTE — Progress Notes (Signed)
No treatment today due to platelets being low. Follow up next week as scheduled.

## 2023-06-17 ENCOUNTER — Inpatient Hospital Stay: Payer: Medicaid Other

## 2023-06-17 DIAGNOSIS — Z95828 Presence of other vascular implants and grafts: Secondary | ICD-10-CM

## 2023-06-17 DIAGNOSIS — C538 Malignant neoplasm of overlapping sites of cervix uteri: Secondary | ICD-10-CM

## 2023-06-25 ENCOUNTER — Inpatient Hospital Stay (HOSPITAL_BASED_OUTPATIENT_CLINIC_OR_DEPARTMENT_OTHER): Payer: Medicaid Other | Admitting: Hematology

## 2023-06-25 ENCOUNTER — Inpatient Hospital Stay: Payer: Medicaid Other

## 2023-06-25 VITALS — BP 137/86 | HR 94 | Temp 99.5°F | Resp 18

## 2023-06-25 DIAGNOSIS — Z7689 Persons encountering health services in other specified circumstances: Secondary | ICD-10-CM | POA: Diagnosis not present

## 2023-06-25 DIAGNOSIS — Z5112 Encounter for antineoplastic immunotherapy: Secondary | ICD-10-CM | POA: Diagnosis not present

## 2023-06-25 DIAGNOSIS — Z95828 Presence of other vascular implants and grafts: Secondary | ICD-10-CM

## 2023-06-25 DIAGNOSIS — C538 Malignant neoplasm of overlapping sites of cervix uteri: Secondary | ICD-10-CM

## 2023-06-25 DIAGNOSIS — C7802 Secondary malignant neoplasm of left lung: Secondary | ICD-10-CM | POA: Diagnosis not present

## 2023-06-25 DIAGNOSIS — I10 Essential (primary) hypertension: Secondary | ICD-10-CM | POA: Diagnosis not present

## 2023-06-25 DIAGNOSIS — C539 Malignant neoplasm of cervix uteri, unspecified: Secondary | ICD-10-CM | POA: Diagnosis not present

## 2023-06-25 DIAGNOSIS — C787 Secondary malignant neoplasm of liver and intrahepatic bile duct: Secondary | ICD-10-CM | POA: Diagnosis not present

## 2023-06-25 DIAGNOSIS — Z801 Family history of malignant neoplasm of trachea, bronchus and lung: Secondary | ICD-10-CM | POA: Diagnosis not present

## 2023-06-25 DIAGNOSIS — Z808 Family history of malignant neoplasm of other organs or systems: Secondary | ICD-10-CM | POA: Diagnosis not present

## 2023-06-25 DIAGNOSIS — Z5111 Encounter for antineoplastic chemotherapy: Secondary | ICD-10-CM | POA: Diagnosis not present

## 2023-06-25 DIAGNOSIS — D649 Anemia, unspecified: Secondary | ICD-10-CM | POA: Diagnosis not present

## 2023-06-25 DIAGNOSIS — G629 Polyneuropathy, unspecified: Secondary | ICD-10-CM | POA: Diagnosis not present

## 2023-06-25 DIAGNOSIS — C7801 Secondary malignant neoplasm of right lung: Secondary | ICD-10-CM | POA: Diagnosis not present

## 2023-06-25 DIAGNOSIS — C786 Secondary malignant neoplasm of retroperitoneum and peritoneum: Secondary | ICD-10-CM | POA: Diagnosis not present

## 2023-06-25 DIAGNOSIS — F17210 Nicotine dependence, cigarettes, uncomplicated: Secondary | ICD-10-CM | POA: Diagnosis not present

## 2023-06-25 LAB — CBC WITH DIFFERENTIAL/PLATELET
Abs Immature Granulocytes: 0.02 10*3/uL (ref 0.00–0.07)
Basophils Absolute: 0 10*3/uL (ref 0.0–0.1)
Basophils Relative: 0 %
Eosinophils Absolute: 0.1 10*3/uL (ref 0.0–0.5)
Eosinophils Relative: 1 %
HCT: 26.3 % — ABNORMAL LOW (ref 36.0–46.0)
Hemoglobin: 8.4 g/dL — ABNORMAL LOW (ref 12.0–15.0)
Immature Granulocytes: 0 %
Lymphocytes Relative: 30 %
Lymphs Abs: 1.7 10*3/uL (ref 0.7–4.0)
MCH: 35.9 pg — ABNORMAL HIGH (ref 26.0–34.0)
MCHC: 31.9 g/dL (ref 30.0–36.0)
MCV: 112.4 fL — ABNORMAL HIGH (ref 80.0–100.0)
Monocytes Absolute: 0.9 10*3/uL (ref 0.1–1.0)
Monocytes Relative: 16 %
Neutro Abs: 2.8 10*3/uL (ref 1.7–7.7)
Neutrophils Relative %: 53 %
Platelets: 206 10*3/uL (ref 150–400)
RBC: 2.34 MIL/uL — ABNORMAL LOW (ref 3.87–5.11)
RDW: 24.9 % — ABNORMAL HIGH (ref 11.5–15.5)
WBC: 5.4 10*3/uL (ref 4.0–10.5)
nRBC: 0.4 % — ABNORMAL HIGH (ref 0.0–0.2)

## 2023-06-25 LAB — MAGNESIUM: Magnesium: 1.6 mg/dL — ABNORMAL LOW (ref 1.7–2.4)

## 2023-06-25 LAB — COMPREHENSIVE METABOLIC PANEL
ALT: 13 U/L (ref 0–44)
AST: 17 U/L (ref 15–41)
Albumin: 3.4 g/dL — ABNORMAL LOW (ref 3.5–5.0)
Alkaline Phosphatase: 36 U/L — ABNORMAL LOW (ref 38–126)
Anion gap: 6 (ref 5–15)
BUN: 18 mg/dL (ref 6–20)
CO2: 24 mmol/L (ref 22–32)
Calcium: 8.6 mg/dL — ABNORMAL LOW (ref 8.9–10.3)
Chloride: 105 mmol/L (ref 98–111)
Creatinine, Ser: 0.91 mg/dL (ref 0.44–1.00)
GFR, Estimated: >60 mL/min (ref >60)
Glucose, Bld: 105 mg/dL — ABNORMAL HIGH (ref 70–99)
Potassium: 3.8 mmol/L (ref 3.5–5.1)
Sodium: 135 mmol/L (ref 135–145)
Total Bilirubin: 0.3 mg/dL (ref 0.3–1.2)
Total Protein: 7.1 g/dL (ref 6.5–8.1)

## 2023-06-25 MED ORDER — SODIUM CHLORIDE 0.9 % IV SOLN
490.0000 mg | Freq: Once | INTRAVENOUS | Status: AC
  Administered 2023-06-25: 490 mg via INTRAVENOUS
  Filled 2023-06-25: qty 49

## 2023-06-25 MED ORDER — SODIUM CHLORIDE 0.9 % IV SOLN
200.0000 mg | Freq: Once | INTRAVENOUS | Status: AC
  Administered 2023-06-25: 200 mg via INTRAVENOUS
  Filled 2023-06-25: qty 8

## 2023-06-25 MED ORDER — SODIUM CHLORIDE 0.9 % IV SOLN: 10.0000 mg | Freq: Once | INTRAVENOUS | Status: DC

## 2023-06-25 MED ORDER — HEPARIN SOD (PORK) LOCK FLUSH 100 UNIT/ML IV SOLN
500.0000 [IU] | Freq: Once | INTRAVENOUS | Status: AC | PRN
  Administered 2023-06-25: 500 [IU]

## 2023-06-25 MED ORDER — CETIRIZINE HCL 10 MG/ML IV SOLN
10.0000 mg | Freq: Once | INTRAVENOUS | Status: AC
  Administered 2023-06-25: 10 mg via INTRAVENOUS
  Filled 2023-06-25: qty 1

## 2023-06-25 MED ORDER — SODIUM CHLORIDE 0.9 % IV SOLN: Freq: Once | INTRAVENOUS | Status: AC

## 2023-06-25 MED ORDER — FAMOTIDINE IN NACL 20-0.9 MG/50ML-% IV SOLN
20.0000 mg | Freq: Once | INTRAVENOUS | Status: AC
  Administered 2023-06-25: 20 mg via INTRAVENOUS
  Filled 2023-06-25: qty 50

## 2023-06-25 MED ORDER — PALONOSETRON HCL INJECTION 0.25 MG/5ML
0.2500 mg | Freq: Once | INTRAVENOUS | Status: AC
  Administered 2023-06-25: 0.25 mg via INTRAVENOUS
  Filled 2023-06-25: qty 5

## 2023-06-25 MED ORDER — SODIUM CHLORIDE 0.9% FLUSH
10.0000 mL | INTRAVENOUS | Status: DC | PRN
  Administered 2023-06-25: 10 mL

## 2023-06-25 MED ORDER — DEXAMETHASONE SODIUM PHOSPHATE 10 MG/ML IJ SOLN
10.0000 mg | Freq: Once | INTRAMUSCULAR | Status: AC
Start: 2023-06-25 — End: 2023-06-25
  Administered 2023-06-25: 10 mg via INTRAVENOUS
  Filled 2023-06-25: qty 1

## 2023-06-25 MED ORDER — SODIUM CHLORIDE 0.9 % IV SOLN
15.0000 mg/kg | Freq: Once | INTRAVENOUS | Status: AC
  Administered 2023-06-25: 1300 mg via INTRAVENOUS
  Filled 2023-06-25: qty 48

## 2023-06-25 MED ORDER — MAGNESIUM SULFATE 2 GM/50ML IV SOLN
2.0000 g | Freq: Once | INTRAVENOUS | Status: AC
Start: 2023-06-25 — End: 2023-06-25
  Administered 2023-06-25: 2 g via INTRAVENOUS
  Filled 2023-06-25: qty 50

## 2023-06-25 MED ORDER — SODIUM CHLORIDE 0.9 % IV SOLN
150.0000 mg | Freq: Once | INTRAVENOUS | Status: AC
  Administered 2023-06-25: 150 mg via INTRAVENOUS
  Filled 2023-06-25: qty 5

## 2023-06-25 NOTE — Progress Notes (Signed)
Patient presents today for chemotherapy infusion. Patient is in satisfactory condition with no new complaints voiced.  Vital signs are stable.  Labs reviewed by Dr. Ellin Saba during the office visit and all labs are within treatment parameters.  Urine protein on 06/09/23 is within treatment parameters.  Magnesium today is 1.6.  We will give magnesium sulfate 2 grams IV x one dose today per standing orders by Dr. Ellin Saba.  We will proceed with treatment per MD orders.  Patient tolerated treatment  well with no complaints voiced.  Patient left ambulatory with significant other in stable condition.  Vital signs stable at discharge.  Follow up as scheduled.

## 2023-06-25 NOTE — Progress Notes (Signed)
[facility] [address] [place] [address]    Clinic Day:  06/25/23     Referring physician: Billie Lade, MD  Patient Care Team: Billie Lade, MD as PCP - General (Internal Medicine) Doreatha Massed, MD as Medical Oncologist (Medical Oncology) Therese Sarah, RN as Oncology Nurse Navigator (Medical Oncology)   ASSESSMENT & PLAN:   Assessment: 1.  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 - CTAP (08/30/2022): Irregularity of the endometrium extending through the cervix with foci of gas in the cervix.  Multifocal omental and peritoneal metastasis.  Metastatic left periaortic, aortocaval, left external iliac and right common iliac lymph nodes.  Multiple bilateral lung nodules at bases.  12 mm hypodensity in the liver near the falciform ligament. - Endometrial biopsy (09/09/2022): Invasive moderately to poorly differentiated squamous cell carcinoma with abundant necrosis.  Cervix biopsy: Invasive and in situ moderate to poorly differentiated squamous cell carcinoma.  High risk HPV positive. - NGS: PD-L1 (22 C3): CPS 2, HER2 IHC negative.  PIK3CA exon 10 pathogenic variant.  MS-stable.  TMB-low.  FANCM and SDHA pathogenic variants. - PD-L1 CPS: 8% - Cycle 1 of cisplatin, paclitaxel, bevacizumab on 10/02/2022, pembrolizumab added with cycle 2 on 10/23/2022 - Bone scan (10/14/2022): No metastatic disease   2.  Social/family history: - She is married and lives at home with her husband and roommate.  She has never worked.  She is current active smoker, half pack per day, for the last 28 years.  She occasionally smokes marijuana. - Mother died of lung cancer.  Father had head and neck cancer.  Maternal grandmother had cancer.  Paternal uncle died of cancer.  Maternal uncle also had cancer.     Plan: 1.  Metastatic cervical cancer to the lungs, peritoneum and liver: - CT CAP (05/08/2023): Decrease in size of retroperitoneal lymph nodes, now subcentimeter with no persistent peritoneal nodularity. - She tolerated her last treatment reasonably well. - Labs today: Normal LFTs.  CBC grossly normal. - She does not report any immunotherapy related side effects. - She may proceed with carboplatin, bevacizumab and pembrolizumab today.  RTC 3 weeks for follow-up.   2.  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  Last transfusion on 06/01/2023.  Hemoglobin today is 8.4.   3.  Peripheral neuropathy: - Continue gabapentin 600 mg 3 times daily.  Take hydrocodone 5/325 as needed if pain not controlled.   4.  Hypertension: - Continue amlodipine 10 mg daily.  Blood pressure is well-controlled today at 118/84.        No orders of the defined types were placed in this encounter.     Alben Deeds Teague,acting as a Neurosurgeon for Doreatha Massed, MD.,have documented all relevant documentation on the behalf of Doreatha Massed, MD,as directed by  Doreatha Massed, MD while in the presence of Doreatha Massed, MD.  I, Doreatha Massed MD, have reviewed the above documentation for accuracy and completeness, and I agree with the above.        Doreatha Massed, MD   10/24/20246:27 PM  CHIEF COMPLAINT:   Diagnosis: metastatic cervical squamous cell carcinoma    Cancer Staging  Cervical cancer [facility]) Staging form: Cervix Uteri, AJCC Version 9 - Clinical stage from 09/19/2022: FIGO Stage IVB (cT4, cN2a, cM1) - Unsigned    Prior Therapy: none  Current Therapy:  Cisplatin + Paclitaxel + Bevacizumab, Keytruda   HISTORY OF PRESENT ILLNESS:   Oncology History Overview Note  PD-L1 CPS 8%   Cervical cancer (HCC)  09/19/2022 Initial Diagnosis   Cervical cancer (HCC)   10/02/2022 -  Chemotherapy   Patient is on Treatment Plan : CERVICAL Cisplatin 50 mg/m2 +  PAClitaxel 135 mg/m2 + Bevacizumab 15 mg/kg + Keytruda q21d        INTERVAL HISTORY:   Kristin Carson is a 52 y.o. female presenting to clinic today for follow up of metastatic cervical squamous cell carcinoma. She was last seen by me on 06/09/23.  Today, she states that she is doing well overall. Her appetite level is at 100%. Her energy level is at 90%.  She is accompanied by her husband. Neuropathy has improved in the feet. She denies any diarrhea or skin rashes.   PAST MEDICAL HISTORY:   Past Medical History: Past Medical History:  Diagnosis Date   Anemia    Asthma    Bronchitis    Cancer (HCC)    Cervical   CHF (congestive heart failure) (HCC)    COPD (chronic obstructive pulmonary disease) (HCC)    Depression    GERD (gastroesophageal reflux disease)    Headache    Hypertension    Port-A-Cath in place 09/25/2022    Surgical History: Past Surgical History:  Procedure Laterality Date   BREAST SURGERY     CESAREAN SECTION     IR IMAGING GUIDED PORT INSERTION  09/30/2022   LEG SURGERY      Social History: Social History   Socioeconomic History   Marital status: Married    Spouse name: Not on file   Number of children: Not on file   Years of education: Not on file   Highest education level: Not on file  Occupational History   Not on file  Tobacco Use   Smoking status: Every Day    Current packs/day: 0.25    Average packs/day: 0.3 packs/day for 23.0 years (5.8 ttl pk-yrs)    Types: Cigarettes   Smokeless tobacco: Never  Vaping Use   Vaping status: Never Used  Substance and Sexual Activity   Alcohol use: Yes    Comment: occ   Drug use: Yes    Types: Marijuana   Sexual activity: Yes    Birth control/protection: None  Other Topics Concern   Not on file  Social History Narrative   Not on file   Social Determinants of Health   Financial Resource Strain: Low Risk  (09/09/2022)   Overall Financial Resource Strain (CARDIA)    Difficulty of Paying Living Expenses:  Not hard at all  Food Insecurity: No Food Insecurity (09/22/2022)   Hunger Vital Sign    Worried About Running Out of Food in the Last Year: Never true    Ran Out of Food in the Last Year: Never true  Transportation Needs: No Transportation Needs (09/22/2022)   PRAPARE - Administrator, Civil Service (Medical): No    Lack of Transportation (Non-Medical): No  Recent Concern: Transportation Needs - Unmet Transportation Needs (09/09/2022)   PRAPARE - Transportation    Lack of Transportation (Medical): Yes    Lack of Transportation (Non-Medical): Yes  Physical Activity: Insufficiently Active (09/09/2022)   Exercise Vital Sign    Days of Exercise per Week: 2 days    Minutes of Exercise per Session: 20 min  Stress: No Stress Concern Present (09/09/2022)   [employer] of  Occupational Health - Occupational Stress Questionnaire    Feeling of Stress : Only a little  Social Connections: Socially Isolated (09/09/2022)   Social Connection and Isolation Panel [NHANES]    Frequency of Communication with Friends and Family: Three times a week    Frequency of Social Gatherings with Friends and Family: More than three times a week    Attends Religious Services: Never    Database administrator or Organizations: No    Attends Banker Meetings: Never    Marital Status: Separated  Intimate Partner Violence: Not At Risk (09/22/2022)   Humiliation, Afraid, Rape, and Kick questionnaire    Fear of Current or Ex-Partner: No    Emotionally Abused: No    Physically Abused: No    Sexually Abused: No    Family History: Family History  Problem Relation Age of Onset   Dermatomyositis Mother    Hypertension Mother    Cancer Mother        lung   Cancer Father        lung   Heart disease Father    Lung cancer Paternal Uncle    Lung cancer Maternal Uncle    Colon cancer Neg Hx    Breast cancer Neg Hx    Ovarian cancer Neg Hx    Endometrial cancer Neg Hx    Pancreatic cancer Neg Hx     Prostate cancer Neg Hx     Current Medications:  Current Outpatient Medications:    albuterol (VENTOLIN HFA) 108 (90 Base) MCG/ACT inhaler, Inhale 2 puffs into the lungs every 6 (six) hours as needed for wheezing or shortness of breath., Disp: 8 g, Rfl: 2   amLODipine (NORVASC) 10 MG tablet, Take 1 tablet (10 mg total) by mouth daily., Disp: 30 tablet, Rfl: 2   Atezolizumab (TECENTRIQ IV), Inject into the vein every 21 ( twenty-one) days., Disp: , Rfl:    Bevacizumab (AVASTIN IV), Inject into the vein every 21 ( twenty-one) days., Disp: , Rfl:    cetirizine (ZYRTEC ALLERGY) 10 MG tablet, Take 1 tablet (10 mg total) by mouth daily., Disp: 5 tablet, Rfl: 0   HYDROcodone-acetaminophen (NORCO/VICODIN) 5-325 MG tablet, Take 1 tablet by mouth every 6 (six) hours as needed for moderate pain., Disp: 120 tablet, Rfl: 0   lidocaine-prilocaine (EMLA) cream, Apply a dime-sized amount to port a cath site and cover with plastic wrap 1 hour prior to infusion appointments, Disp: 30 g, Rfl: 3   loperamide (IMODIUM) 2 MG capsule, Take 1 capsule (2 mg total) by mouth as needed for diarrhea or loose stools (Take 2 capsules after the first loose stool and 1 capsule after each loose stool thereafter.  DO NOT EXCEED 8 capsules a day.)., Disp: 30 capsule, Rfl: 0   megestrol (MEGACE) 400 MG/10ML suspension, Take 10 mLs (400 mg total) by mouth 2 (two) times daily., Disp: 480 mL, Rfl: 3   naproxen sodium (ALEVE) 220 MG tablet, Take 220 mg by mouth daily as needed (pain)., Disp: , Rfl:    nicotine polacrilex (NICORETTE) 4 MG gum, Take 1 each (4 mg total) by mouth as needed for smoking cessation., Disp: 100 tablet, Rfl: 0   ondansetron (ZOFRAN) 8 MG tablet, Take 1 tablet (8 mg total) by mouth every 8 (eight) hours as needed for nausea or vomiting. Start on the third day after cisplatin., Disp: 30 tablet, Rfl: 1   pantoprazole (PROTONIX) 40 MG tablet, Take 1 tablet (40 mg total) by mouth daily., Disp:  90 tablet, Rfl: 1    pregabalin (LYRICA) 200 MG capsule, Take 1 capsule (200 mg total) by mouth 3 (three) times daily., Disp: 180 capsule, Rfl: 0   prochlorperazine (COMPAZINE) 10 MG tablet, Take 1 tablet (10 mg total) by mouth every 6 (six) hours as needed (Nausea or vomiting)., Disp: 30 tablet, Rfl: 1 No current facility-administered medications for this visit.  Facility-Administered Medications Ordered in Other Visits:    sodium chloride flush (NS) 0.9 % injection 10 mL, 10 mL, Intracatheter, PRN, Doreatha Massed, MD, 10 mL at 06/25/23 1431   Allergies: Allergies  Allergen Reactions   Carrot [Daucus Carota] Hives   Lisinopril-Hydrochlorothiazide     Oral swelling   Ibuprofen Other (See Comments)    Oral swelling   Other Itching and Other (See Comments)    Hair dye, blisters, pus-filled, soreness   Tramadol Hives   Erythromycin Hives    REVIEW OF SYSTEMS:   Review of Systems  Constitutional:  Negative for chills, fatigue and fever.  HENT:   Negative for lump/mass, mouth sores, nosebleeds, sore throat and trouble swallowing.   Eyes:  Negative for eye problems.  Respiratory:  Positive for cough. Negative for shortness of breath.   Cardiovascular:  Negative for chest pain, leg swelling and palpitations.  Gastrointestinal:  Negative for abdominal pain, constipation, diarrhea, nausea and vomiting.  Genitourinary:  Negative for bladder incontinence, difficulty urinating, dysuria, frequency, hematuria and nocturia.   Musculoskeletal:  Negative for arthralgias, back pain, flank pain, myalgias and neck pain.  Skin:  Negative for itching and rash.  Neurological:  Positive for numbness (and tingling in hands and feet). Negative for dizziness and headaches.  Hematological:  Does not bruise/bleed easily.  Psychiatric/Behavioral:  Negative for depression, sleep disturbance and suicidal ideas. The patient is not nervous/anxious.   All other systems reviewed and are negative.    VITALS:   Blood pressure  118/84, pulse 100, temperature 98 F (36.7 C), temperature source Oral, resp. rate 18, height 5' 3.5" (1.613 m), weight 174 lb 14.4 oz (79.3 kg), last menstrual period 08/15/2022, SpO2 100%.  Wt Readings from Last 3 Encounters:  06/25/23 174 lb 14.4 oz (79.3 kg)  06/09/23 164 lb 9.6 oz (74.7 kg)  06/01/23 162 lb 14.7 oz (73.9 kg)    Body mass index is 30.5 kg/m.  Performance status (ECOG): 1 - Symptomatic but completely ambulatory  PHYSICAL EXAM:   Physical Exam Vitals and nursing note reviewed. Exam conducted with a chaperone present.  Constitutional:      Appearance: Normal appearance.  Cardiovascular:     Rate and Rhythm: Normal rate and regular rhythm.     Pulses: Normal pulses.     Heart sounds: Normal heart sounds.  Pulmonary:     Effort: Pulmonary effort is normal.     Breath sounds: Normal breath sounds.  Abdominal:     Palpations: Abdomen is soft. There is no hepatomegaly, splenomegaly or mass.     Tenderness: There is no abdominal tenderness.  Musculoskeletal:     Right lower leg: No edema.     Left lower leg: No edema.  Lymphadenopathy:     Cervical: No cervical adenopathy.     Right cervical: No superficial, deep or posterior cervical adenopathy.    Left cervical: No superficial, deep or posterior cervical adenopathy.     Upper Body:     Right upper body: No supraclavicular or axillary adenopathy.     Left upper body: No supraclavicular or axillary adenopathy.  Neurological:  General: No focal deficit present.     Mental Status: She is alert and oriented to person, place, and time.  Psychiatric:        Mood and Affect: Mood normal.        Behavior: Behavior normal.     LABS:      Latest Ref Rng & Units 06/25/2023    9:26 AM 06/09/2023    9:07 AM 06/01/2023    9:36 AM  CBC  WBC 4.0 - 10.5 K/uL 5.4  3.6  4.3   Hemoglobin 12.0 - 15.0 g/dL 8.4  8.3  6.8   Hematocrit 36.0 - 46.0 % 26.3  25.9  20.6   Platelets 150 - 400 K/uL 206  69  52       Latest  Ref Rng & Units 06/25/2023    9:26 AM 06/09/2023    9:07 AM 06/01/2023    9:09 AM  CMP  Glucose 70 - 99 mg/dL 119  147  86   BUN 6 - 20 mg/dL 18  12  10    Creatinine 0.44 - 1.00 mg/dL 8.29  5.62  1.30   Sodium 135 - 145 mmol/L 135  135  136   Potassium 3.5 - 5.1 mmol/L 3.8  3.8  3.8   Chloride 98 - 111 mmol/L 105  101  103   CO2 22 - 32 mmol/L 24  23  19    Calcium 8.9 - 10.3 mg/dL 8.6  8.7  9.1   Total Protein 6.5 - 8.1 g/dL 7.1  7.5  8.1   Total Bilirubin 0.3 - 1.2 mg/dL 0.3  0.3  0.8   Alkaline Phos 38 - 126 U/L 36  51  58   AST 15 - 41 U/L 17  28  43   ALT 0 - 44 U/L 13  17  25       No results found for: "CEA1", "CEA" / No results found for: "CEA1", "CEA" No results found for: "PSA1" No results found for: "QMV784" No results found for: "CAN125"  No results found for: "TOTALPROTELP", "ALBUMINELP", "A1GS", "A2GS", "BETS", "BETA2SER", "GAMS", "MSPIKE", "SPEI" Lab Results  Component Value Date   TIBC 412 09/22/2022   FERRITIN 35 09/22/2022   IRONPCTSAT 7 (L) 09/22/2022   No results found for: "LDH"   STUDIES:   No results found.

## 2023-06-25 NOTE — Progress Notes (Signed)
Patient has been examined by Dr. Katragadda. Vital signs and labs have been reviewed by MD - ANC, Creatinine, LFTs, hemoglobin, and platelets are within treatment parameters per M.D. - pt may proceed with treatment.  Primary RN and pharmacy notified.  

## 2023-06-25 NOTE — Patient Instructions (Signed)
[facility]  Discharge Instructions: Thank you for choosing [facility] to provide your oncology and hematology care.  If you have a lab appointment with the Cancer Center - please note that after April 8th, 2024, all labs will be drawn in the cancer center.  You do not have to check in or register with the main entrance as you have in the past but will complete your check-in in the cancer center.  Wear comfortable clothing and clothing appropriate for easy access to any Portacath or PICC line.   We strive to give you quality time with your provider. You may need to reschedule your appointment if you arrive late (15 or more minutes).  Arriving late affects you and other patients whose appointments are after yours.  Also, if you miss three or more appointments without notifying the office, you may be dismissed from the clinic at the provider's discretion.      For prescription refill requests, have your pharmacy contact our office and allow 72 hours for refills to be completed.    Today you received the following chemotherapy and/or immunotherapy agents Keytruda/Vegzelma/Carboplatin.  Pembrolizumab Injection What is this medication? PEMBROLIZUMAB (PEM broe LIZ ue mab) treats some types of cancer. It works by helping your immune system slow or stop the spread of cancer cells. It is a monoclonal antibody. This medicine may be used for other purposes; ask your health care provider or pharmacist if you have questions. COMMON BRAND NAME(S): Keytruda What should I tell my care team before I take this medication? They need to know if you have any of these conditions: Allogeneic stem cell transplant (uses someone else's stem cells) Autoimmune diseases, such as Crohn disease, ulcerative colitis, lupus History of chest radiation Nervous system problems, such as Guillain-Barre syndrome, myasthenia gravis Organ transplant An unusual or allergic reaction to  pembrolizumab, other medications, foods, dyes, or preservatives Pregnant or trying to get pregnant Breast-feeding How should I use this medication? This medication is injected into a vein. It is given by your care team in a hospital or clinic setting. A special MedGuide will be given to you before each treatment. Be sure to read this information carefully each time. Talk to your care team about the use of this medication in children. While it may be prescribed for children as young as 6 months for selected conditions, precautions do apply. Overdosage: If you think you have taken too much of this medicine contact a poison control center or emergency room at once. NOTE: This medicine is only for you. Do not share this medicine with others. What if I miss a dose? Keep appointments for follow-up doses. It is important not to miss your dose. Call your care team if you are unable to keep an appointment. What may interact with this medication? Interactions have not been studied. This list may not describe all possible interactions. Give your health care provider a list of all the medicines, herbs, non-prescription drugs, or dietary supplements you use. Also tell them if you smoke, drink alcohol, or use illegal drugs. Some items may interact with your medicine. What should I watch for while using this medication? Your condition will be monitored carefully while you are receiving this medication. You may need blood work while taking this medication. This medication may cause serious skin reactions. They can happen weeks to months after starting the medication. Contact your care team right away if you notice fevers or flu-like symptoms with a rash. The  rash may be red or purple and then turn into blisters or peeling of the skin. You may also notice a red rash with swelling of the face, lips, or lymph nodes in your neck or under your arms. Tell your care team right away if you have any change in your  eyesight. Talk to your care team if you may be pregnant. Serious birth defects can occur if you take this medication during pregnancy and for 4 months after the last dose. You will need a negative pregnancy test before starting this medication. Contraception is recommended while taking this medication and for 4 months after the last dose. Your care team can help you find the option that works for you. Do not breastfeed while taking this medication and for 4 months after the last dose. What side effects may I notice from receiving this medication? Side effects that you should report to your care team as soon as possible: Allergic reactions--skin rash, itching, hives, swelling of the face, lips, tongue, or throat Dry cough, shortness of breath or trouble breathing Eye pain, redness, irritation, or discharge with blurry or decreased vision Heart muscle inflammation--unusual weakness or fatigue, shortness of breath, chest pain, fast or irregular heartbeat, dizziness, swelling of the ankles, feet, or hands Hormone gland problems--headache, sensitivity to light, unusual weakness or fatigue, dizziness, fast or irregular heartbeat, increased sensitivity to cold or heat, excessive sweating, constipation, hair loss, increased thirst or amount of urine, tremors or shaking, irritability Infusion reactions--chest pain, shortness of breath or trouble breathing, feeling faint or lightheaded Kidney injury (glomerulonephritis)--decrease in the amount of urine, red or dark Tami Barren urine, foamy or bubbly urine, swelling of the ankles, hands, or feet Liver injury--right upper belly pain, loss of appetite, nausea, light-colored stool, dark yellow or Kristin Carson urine, yellowing skin or eyes, unusual weakness or fatigue Pain, tingling, or numbness in the hands or feet, muscle weakness, change in vision, confusion or trouble speaking, loss of balance or coordination, trouble walking, seizures Rash, fever, and swollen lymph  nodes Redness, blistering, peeling, or loosening of the skin, including inside the mouth Sudden or severe stomach pain, bloody diarrhea, fever, nausea, vomiting Side effects that usually do not require medical attention (report to your care team if they continue or are bothersome): Bone, joint, or muscle pain Diarrhea Fatigue Loss of appetite Nausea Skin rash This list may not describe all possible side effects. Call your doctor for medical advice about side effects. You may report side effects to FDA at 1-800-FDA-1088. Where should I keep my medication? This medication is given in a hospital or clinic. It will not be stored at home. NOTE: This sheet is a summary. It may not cover all possible information. If you have questions about this medicine, talk to your doctor, pharmacist, or health care provider.  2024 Elsevier/Gold Standard (2021-12-31 00:00:00)    Bevacizumab Injection What is this medication? BEVACIZUMAB (be va SIZ yoo mab) treats some types of cancer. It works by blocking a protein that causes cancer cells to grow and multiply. This helps to slow or stop the spread of cancer cells. It is a monoclonal antibody. This medicine may be used for other purposes; ask your health care provider or pharmacist if you have questions. COMMON BRAND NAME(S): Alymsys, Avastin, MVASI, Omer Jack What should I tell my care team before I take this medication? They need to know if you have any of these conditions: Blood clots Coughing up blood Having or recent surgery Heart failure High blood pressure History  of a connection between 2 or more body parts that do not usually connect (fistula) History of a tear in your stomach or intestines Protein in your urine An unusual or allergic reaction to bevacizumab, other medications, foods, dyes, or preservatives Pregnant or trying to get pregnant Breast-feeding How should I use this medication? This medication is injected into a vein. It is given  by your care team in a hospital or clinic setting. Talk to your care team the use of this medication in children. Special care may be needed. Overdosage: If you think you have taken too much of this medicine contact a poison control center or emergency room at once. NOTE: This medicine is only for you. Do not share this medicine with others. What if I miss a dose? Keep appointments for follow-up doses. It is important not to miss your dose. Call your care team if you are unable to keep an appointment. What may interact with this medication? Interactions are not expected. This list may not describe all possible interactions. Give your health care provider a list of all the medicines, herbs, non-prescription drugs, or dietary supplements you use. Also tell them if you smoke, drink alcohol, or use illegal drugs. Some items may interact with your medicine. What should I watch for while using this medication? Your condition will be monitored carefully while you are receiving this medication. You may need blood work while taking this medication. This medication may make you feel generally unwell. This is not uncommon as chemotherapy can affect healthy cells as well as cancer cells. Report any side effects. Continue your course of treatment even though you feel ill unless your care team tells you to stop. This medication may increase your risk to bruise or bleed. Call your care team if you notice any unusual bleeding. Before having surgery, talk to your care team to make sure it is ok. This medication can increase the risk of poor healing of your surgical site or wound. You will need to stop this medication for 28 days before surgery. After surgery, wait at least 28 days before restarting this medication. Make sure the surgical site or wound is healed enough before restarting this medication. Talk to your care team if questions. Talk to your care team if you may be pregnant. Serious birth defects can occur if  you take this medication during pregnancy and for 6 months after the last dose. Contraception is recommended while taking this medication and for 6 months after the last dose. Your care team can help you find the option that works for you. Do not breastfeed while taking this medication and for 6 months after the last dose. This medication can cause infertility. Talk to your care team if you are concerned about your fertility. What side effects may I notice from receiving this medication? Side effects that you should report to your care team as soon as possible: Allergic reactions--skin rash, itching, hives, swelling of the face, lips, tongue, or throat Bleeding--bloody or black, tar-like stools, vomiting blood or Yarden Hillis material that looks like coffee grounds, red or dark Giles Currie urine, small red or purple spots on skin, unusual bruising or bleeding Blood clot--pain, swelling, or warmth in the leg, shortness of breath, chest pain Heart attack--pain or tightness in the chest, shoulders, arms, or jaw, nausea, shortness of breath, cold or clammy skin, feeling faint or lightheaded Heart failure--shortness of breath, swelling of the ankles, feet, or hands, sudden weight gain, unusual weakness or fatigue Increase in blood pressure  Infection--fever, chills, cough, sore throat, wounds that don't heal, pain or trouble when passing urine, general feeling of discomfort or being unwell Infusion reactions--chest pain, shortness of breath or trouble breathing, feeling faint or lightheaded Kidney injury--decrease in the amount of urine, swelling of the ankles, hands, or feet Stomach pain that is severe, does not go away, or gets worse Stroke--sudden numbness or weakness of the face, arm, or leg, trouble speaking, confusion, trouble walking, loss of balance or coordination, dizziness, severe headache, change in vision Sudden and severe headache, confusion, change in vision, seizures, which may be signs of posterior  reversible encephalopathy syndrome (PRES) Side effects that usually do not require medical attention (report to your care team if they continue or are bothersome): Back pain Change in taste Diarrhea Dry skin Increased tears Nosebleed This list may not describe all possible side effects. Call your doctor for medical advice about side effects. You may report side effects to FDA at 1-800-FDA-1088. Where should I keep my medication? This medication is given in a hospital or clinic. It will not be stored at home. NOTE: This sheet is a summary. It may not cover all possible information. If you have questions about this medicine, talk to your doctor, pharmacist, or health care provider.  2024 Elsevier/Gold Standard (2022-01-03 00:00:00)    Carboplatin Injection What is this medication? CARBOPLATIN (KAR boe pla tin) treats some types of cancer. It works by slowing down the growth of cancer cells. This medicine may be used for other purposes; ask your health care provider or pharmacist if you have questions. COMMON BRAND NAME(S): Paraplatin What should I tell my care team before I take this medication? They need to know if you have any of these conditions: Blood disorders Hearing problems Kidney disease Recent or ongoing radiation therapy An unusual or allergic reaction to carboplatin, cisplatin, other medications, foods, dyes, or preservatives Pregnant or trying to get pregnant Breast-feeding How should I use this medication? This medication is injected into a vein. It is given by your care team in a hospital or clinic setting. Talk to your care team about the use of this medication in children. Special care may be needed. Overdosage: If you think you have taken too much of this medicine contact a poison control center or emergency room at once. NOTE: This medicine is only for you. Do not share this medicine with others. What if I miss a dose? Keep appointments for follow-up doses. It is  important not to miss your dose. Call your care team if you are unable to keep an appointment. What may interact with this medication? Medications for seizures Some antibiotics, such as amikacin, gentamicin, neomycin, streptomycin, tobramycin Vaccines This list may not describe all possible interactions. Give your health care provider a list of all the medicines, herbs, non-prescription drugs, or dietary supplements you use. Also tell them if you smoke, drink alcohol, or use illegal drugs. Some items may interact with your medicine. What should I watch for while using this medication? Your condition will be monitored carefully while you are receiving this medication. You may need blood work while taking this medication. This medication may make you feel generally unwell. This is not uncommon, as chemotherapy can affect healthy cells as well as cancer cells. Report any side effects. Continue your course of treatment even though you feel ill unless your care team tells you to stop. In some cases, you may be given additional medications to help with side effects. Follow all directions for their  use. This medication may increase your risk of getting an infection. Call your care team for advice if you get a fever, chills, sore throat, or other symptoms of a cold or flu. Do not treat yourself. Try to avoid being around people who are sick. Avoid taking medications that contain aspirin, acetaminophen, ibuprofen, naproxen, or ketoprofen unless instructed by your care team. These medications may hide a fever. Be careful brushing or flossing your teeth or using a toothpick because you may get an infection or bleed more easily. If you have any dental work done, tell your dentist you are receiving this medication. Talk to your care team if you wish to become pregnant or think you might be pregnant. This medication can cause serious birth defects. Talk to your care team about effective forms of contraception. Do  not breast-feed while taking this medication. What side effects may I notice from receiving this medication? Side effects that you should report to your care team as soon as possible: Allergic reactions--skin rash, itching, hives, swelling of the face, lips, tongue, or throat Infection--fever, chills, cough, sore throat, wounds that don't heal, pain or trouble when passing urine, general feeling of discomfort or being unwell Low red blood cell level--unusual weakness or fatigue, dizziness, headache, trouble breathing Pain, tingling, or numbness in the hands or feet, muscle weakness, change in vision, confusion or trouble speaking, loss of balance or coordination, trouble walking, seizures Unusual bruising or bleeding Side effects that usually do not require medical attention (report to your care team if they continue or are bothersome): Hair loss Nausea Unusual weakness or fatigue Vomiting This list may not describe all possible side effects. Call your doctor for medical advice about side effects. You may report side effects to FDA at 1-800-FDA-1088. Where should I keep my medication? This medication is given in a hospital or clinic. It will not be stored at home. NOTE: This sheet is a summary. It may not cover all possible information. If you have questions about this medicine, talk to your doctor, pharmacist, or health care provider.  2024 Elsevier/Gold Standard (2021-12-10 00:00:00)        To help prevent nausea and vomiting after your treatment, we encourage you to take your nausea medication as directed.  BELOW ARE SYMPTOMS THAT SHOULD BE REPORTED IMMEDIATELY: *FEVER GREATER THAN 100.4 F (38 C) OR HIGHER *CHILLS OR SWEATING *NAUSEA AND VOMITING THAT IS NOT CONTROLLED WITH YOUR NAUSEA MEDICATION *UNUSUAL SHORTNESS OF BREATH *UNUSUAL BRUISING OR BLEEDING *URINARY PROBLEMS (pain or burning when urinating, or frequent urination) *BOWEL PROBLEMS (unusual diarrhea, constipation, pain  near the anus) TENDERNESS IN MOUTH AND THROAT WITH OR WITHOUT PRESENCE OF ULCERS (sore throat, sores in mouth, or a toothache) UNUSUAL RASH, SWELLING OR PAIN  UNUSUAL VAGINAL DISCHARGE OR ITCHING   Items with * indicate a potential emergency and should be followed up as soon as possible or go to the Emergency Department if any problems should occur.  Please show the CHEMOTHERAPY ALERT CARD or IMMUNOTHERAPY ALERT CARD at check-in to the Emergency Department and triage nurse.  Should you have questions after your visit or need to cancel or reschedule your appointment, please contact [facility] [phone]  and follow the prompts.  Office hours are 8:00 a.m. to 4:30 p.m. Monday - Friday. Please note that voicemails left after 4:00 p.m. may not be returned until the following business day.  We are closed weekends and major holidays. You have access to a nurse at all times  for urgent questions. Please call the main number to the clinic [phone] and follow the prompts.  For any non-urgent questions, you may also contact your provider using MyChart. We now offer e-Visits for anyone 79 and older to request care online for non-urgent symptoms. For details visit [email or web address].   Also download the MyChart app! Go to the app store, search "MyChart", open the app, select [facility], and log in with your MyChart username and password.

## 2023-06-30 ENCOUNTER — Inpatient Hospital Stay: Payer: Medicaid Other | Admitting: Hematology

## 2023-06-30 ENCOUNTER — Inpatient Hospital Stay: Payer: Medicaid Other

## 2023-07-02 ENCOUNTER — Emergency Department (HOSPITAL_COMMUNITY): Payer: Medicaid Other

## 2023-07-02 ENCOUNTER — Emergency Department (HOSPITAL_COMMUNITY)
Admission: EM | Admit: 2023-07-02 | Discharge: 2023-07-02 | Discharge status: Against Medical Advice | Payer: Medicaid Other | Attending: Emergency Medicine | Admitting: Emergency Medicine

## 2023-07-02 ENCOUNTER — Other Ambulatory Visit: Payer: Self-pay

## 2023-07-02 ENCOUNTER — Encounter (HOSPITAL_COMMUNITY): Payer: Self-pay

## 2023-07-02 DIAGNOSIS — S0003XA Contusion of scalp, initial encounter: Secondary | ICD-10-CM

## 2023-07-02 DIAGNOSIS — W0110XA Fall on same level from slipping, tripping and stumbling with subsequent striking against unspecified object, initial encounter: Secondary | ICD-10-CM | POA: Diagnosis not present

## 2023-07-02 DIAGNOSIS — Z79899 Other long term (current) drug therapy: Secondary | ICD-10-CM | POA: Insufficient documentation

## 2023-07-02 DIAGNOSIS — J449 Chronic obstructive pulmonary disease, unspecified: Secondary | ICD-10-CM | POA: Diagnosis not present

## 2023-07-02 DIAGNOSIS — I509 Heart failure, unspecified: Secondary | ICD-10-CM | POA: Diagnosis not present

## 2023-07-02 DIAGNOSIS — R6889 Other general symptoms and signs: Secondary | ICD-10-CM | POA: Diagnosis not present

## 2023-07-02 DIAGNOSIS — W19XXXA Unspecified fall, initial encounter: Secondary | ICD-10-CM | POA: Diagnosis not present

## 2023-07-02 DIAGNOSIS — I110 Hypertensive heart disease with heart failure: Secondary | ICD-10-CM | POA: Diagnosis not present

## 2023-07-02 DIAGNOSIS — Z8541 Personal history of malignant neoplasm of cervix uteri: Secondary | ICD-10-CM | POA: Insufficient documentation

## 2023-07-02 DIAGNOSIS — Z743 Need for continuous supervision: Secondary | ICD-10-CM | POA: Diagnosis not present

## 2023-07-02 DIAGNOSIS — S0990XA Unspecified injury of head, initial encounter: Secondary | ICD-10-CM | POA: Diagnosis not present

## 2023-07-02 DIAGNOSIS — I10 Essential (primary) hypertension: Secondary | ICD-10-CM | POA: Diagnosis not present

## 2023-07-02 NOTE — ED Notes (Addendum)
Pt came back from CT after attempted CT scan of pt head. Pt would not be cooperative and refused to get on table for scan. Pt not redirectable. EDP made aware. Will await CT imaging at this time.

## 2023-07-02 NOTE — ED Triage Notes (Signed)
Pt been drinking and smoking weed tonight when she fell and hit head. C/o head pain. Knot noted to forehead.

## 2023-07-02 NOTE — ED Notes (Signed)
Pt stated she was "[place] leave now" Dr was notified of pts statement and status. Pt unwilling to receive education discharge papers or discharge recommendations. Due to aggressive behavior RN stepped aside and allowed Pt to leave. While leaving pt stated her husband was in the parking lot waiting for her... this is the person pt asked [place] to block from visiting.

## 2023-07-02 NOTE — ED Provider Notes (Signed)
  7:02 AM Patient signed out to me by previous ED physician. Pt is a 52 yo female presenting after fall.   Plan: Waiting for head CT.    Physical Exam  BP (!) 145/98   Pulse 89   Temp 98.3 F (36.8 C) (Oral)   Resp 16   LMP 08/15/2022 (Approximate)   SpO2 100%   Physical Exam  Procedures  Procedures  ED Course / MDM    Medical Decision Making Amount and/or Complexity of Data Reviewed Radiology: ordered.   Patient requesting to leave prior to CT head imaging results. On my informal ready I do not see any large acute bleeds at this time. She has normal mental status and is clinically sober at this time. Able to ambulate without difficulty.   The patient has requested to leave the ED against medical advice. I believe this patient is of sound mind and medical decision making capacity to refuse medical care. The patient is responding and asking questions appropriately. The patient is oriented to person, place and time. The patient is not psychotic, delusional, suicidal, homicidal or hallucinating. The patient demonstrates a normal mental capacity to make decisions regarding their healthcare. The patient is clinically sober and does not appear to be under the influence of any illicit drugs at this time. The patient has been advised of the risks, in layman terms, of leaving AMA which include, but are not limited to death, coma, permanent disability, loss of current lifestyle, delay in diagnosis. Alternatives have been offered - the patient remains steadfast in their wish to leave. The patient has been advised that should they change their mind they are welcome to return to this hospital, or any other, at any time. The patient understands that in no way does an AMA discharge mean that I do not want them to have the best medical care available. To this end, I have offered appropriate prescriptions, referrals, and discharge instructions. The patient did sign AMA paperwork. The above discussion  was witnessed by another member of staff.        Franne Forts, DO 07/02/23 1109

## 2023-07-02 NOTE — ED Provider Notes (Signed)
[facility] AT [facility] Provider Note   CSN: [id number] Arrival date & time: 07/02/23  0225     History  Chief Complaint  Patient presents with   Kristin Carson DANELLE ZOELLICK is a 52 y.o. female.  The history is provided by the patient.  Fall Pertinent negatives include no chest pain, no abdominal pain and no shortness of breath.  She has a history of hypertension, COPD, heart failure, cervical cancer and states that she had been smoking some marijuana when she fell and hit her head.  It is unclear if there was loss of consciousness.  She denies nausea or vomiting.  She denies other injury.   Home Medications Prior to Admission medications   Medication Sig Start Date End Date Taking? Authorizing Provider  albuterol (VENTOLIN HFA) 108 (90 Base) MCG/ACT inhaler Inhale 2 puffs into the lungs every 6 (six) hours as needed for wheezing or shortness of breath. 05/26/23   Billie Lade, MD  amLODipine (NORVASC) 10 MG tablet Take 1 tablet (10 mg total) by mouth daily. 04/03/23   Billie Lade, MD  Atezolizumab (TECENTRIQ IV) Inject into the vein every 21 ( twenty-one) days. 10/02/22   [provider]  Bevacizumab (AVASTIN IV) Inject into the vein every 21 ( twenty-one) days.    [provider]  cetirizine (ZYRTEC ALLERGY) 10 MG tablet Take 1 tablet (10 mg total) by mouth daily. 05/24/23   Carmel Sacramento A, PA-C  HYDROcodone-acetaminophen (NORCO/VICODIN) 5-325 MG tablet Take 1 tablet by mouth every 6 (six) hours as needed for moderate pain. 06/04/23   Doreatha Massed, MD  lidocaine-prilocaine (EMLA) cream Apply a dime-sized amount to port a cath site and cover with plastic wrap 1 hour prior to infusion appointments 06/04/23   Doreatha Massed, MD  loperamide (IMODIUM) 2 MG capsule Take 1 capsule (2 mg total) by mouth as needed for diarrhea or loose stools (Take 2 capsules after the first loose stool and 1 capsule after each loose stool  thereafter.  DO NOT EXCEED 8 capsules a day.). 03/12/23   Doreatha Massed, MD  megestrol (MEGACE) 400 MG/10ML suspension Take 10 mLs (400 mg total) by mouth 2 (two) times daily. 06/09/23   Doreatha Massed, MD  naproxen sodium (ALEVE) 220 MG tablet Take 220 mg by mouth daily as needed (pain).    [provider]  nicotine polacrilex (NICORETTE) 4 MG gum Take 1 each (4 mg total) by mouth as needed for smoking cessation. 05/26/23   Billie Lade, MD  ondansetron (ZOFRAN) 8 MG tablet Take 1 tablet (8 mg total) by mouth every 8 (eight) hours as needed for nausea or vomiting. Start on the third day after cisplatin. 06/04/23   Doreatha Massed, MD  pantoprazole (PROTONIX) 40 MG tablet Take 1 tablet (40 mg total) by mouth daily. 05/26/23   Billie Lade, MD  pregabalin (LYRICA) 200 MG capsule Take 1 capsule (200 mg total) by mouth 3 (three) times daily. 06/04/23   Doreatha Massed, MD  prochlorperazine (COMPAZINE) 10 MG tablet Take 1 tablet (10 mg total) by mouth every 6 (six) hours as needed (Nausea or vomiting). 06/04/23   Doreatha Massed, MD  famotidine (PEPCID) 20 MG tablet Take 1 tablet (20 mg total) by mouth 2 (two) times daily. Patient not taking: Reported on 01/12/2018 12/18/17 02/02/20  Devoria Albe, MD  hydrochlorothiazide (HYDRODIURIL) 25 MG tablet Take 1 tablet (25 mg total) by mouth daily. 06/17/19 02/02/20  Devoria Albe, MD  Allergies    Carrot [daucus carota], Lisinopril-hydrochlorothiazide, Ibuprofen, Other, Tramadol, and Erythromycin    Review of Systems   Review of Systems  Constitutional:  Negative for chills and fever.  HENT:  Negative for ear pain and sore throat.   Eyes:  Negative for pain and visual disturbance.  Respiratory:  Negative for cough and shortness of breath.   Cardiovascular:  Negative for chest pain and palpitations.  Gastrointestinal:  Negative for abdominal pain and vomiting.  Genitourinary:  Negative for dysuria and hematuria.   Musculoskeletal:  Negative for arthralgias and back pain.  Skin:  Negative for color change and rash.  Neurological:  Negative for seizures and syncope.  All other systems reviewed and are negative.   Physical Exam Updated Vital Signs BP (!) 145/98   Pulse 89   Temp 98.3 F (36.8 C) (Oral)   Resp 16   LMP 08/15/2022 (Approximate)   SpO2 100%  Physical Exam Vitals and nursing note reviewed.   52 year old female, resting comfortably and in no acute distress. Vital signs are significant for elevated blood pressure. Oxygen saturation is 100%, which is normal. Head is normocephalic and atraumatic. PERRLA, EOMI. Neck is nontender. Back is nontender. Lungs are clear without rales, wheezes, or rhonchi. Chest is nontender. Heart has regular rate and rhythm without murmur. Abdomen is soft, flat, nontender. Extremities have swelling or deformity. Skin is warm and dry without rash. Neurologic: Somnolent, difficult to arouse but no focal motor or sensory deficits.  ED Results / Procedures / Treatments    Radiology No results found.  Procedures Procedures    Medications Ordered in ED Medications - No data to display  ED Course/ Medical Decision Making/ A&P                                 Medical Decision Making Amount and/or Complexity of Data Reviewed Radiology: ordered.   Fall with head injury.  She is somnolent here and is difficult to determine if this is just she is sleeping or whether it is result of head injury.  Therefore, I have ordered CT of head.  CT is still pending.  I have signing the case out to Dr. Wallace Cullens.  Final Clinical Impression(s) / ED Diagnoses Final diagnoses:  Fall, initial encounter  Contusion of scalp, initial encounter    Rx / DC Orders ED Discharge Orders     None         Dione Booze, MD 07/02/23 [phone]

## 2023-07-02 NOTE — ED Notes (Signed)
CT contacted to inform patient is agreeable to CT scan at this time.

## 2023-07-03 DIAGNOSIS — Z419 Encounter for procedure for purposes other than remedying health state, unspecified: Secondary | ICD-10-CM | POA: Diagnosis not present

## 2023-07-07 ENCOUNTER — Inpatient Hospital Stay: Payer: Medicaid Other

## 2023-07-07 ENCOUNTER — Inpatient Hospital Stay: Payer: Medicaid Other | Admitting: Hematology

## 2023-07-07 DIAGNOSIS — Z7689 Persons encountering health services in other specified circumstances: Secondary | ICD-10-CM | POA: Diagnosis not present

## 2023-07-10 ENCOUNTER — Other Ambulatory Visit: Payer: Self-pay

## 2023-07-10 DIAGNOSIS — G893 Neoplasm related pain (acute) (chronic): Secondary | ICD-10-CM

## 2023-07-10 MED ORDER — HYDROCODONE-ACETAMINOPHEN 5-325 MG PO TABS: 1.0000 | ORAL_TABLET | Freq: Four times a day (QID) | ORAL | 0 refills | Status: DC | PRN

## 2023-07-15 NOTE — Progress Notes (Incomplete)
[name] [name]    Clinic Day:  07/15/23     Referring physician: Billie Lade, MD  Patient Care Team: Billie Lade, MD as PCP - General (Internal Medicine) Doreatha Massed, MD as Medical Oncologist (Medical Oncology) Therese Sarah, RN as Oncology Nurse Navigator (Medical Oncology)   ASSESSMENT & PLAN:   Assessment: 1.  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 - CTAP (08/30/2022): Irregularity of the endometrium extending through the cervix with foci of gas in the cervix.  Multifocal omental and peritoneal metastasis.  Metastatic left periaortic, aortocaval, left external iliac and right common iliac lymph nodes.  Multiple bilateral lung nodules at bases.  12 mm hypodensity in the liver near the falciform ligament. - Endometrial biopsy (09/09/2022): Invasive moderately to poorly differentiated squamous cell carcinoma with abundant necrosis.  Cervix biopsy: Invasive and in situ moderate to poorly differentiated squamous cell carcinoma.  High risk HPV positive. - NGS: PD-L1 (22 C3): CPS 2, HER2 IHC negative.  PIK3CA exon 10 pathogenic variant.  MS-stable.  TMB-low.  FANCM and SDHA pathogenic variants. - PD-L1 CPS: 8% - Cycle 1 of cisplatin, paclitaxel, bevacizumab on 10/02/2022, pembrolizumab added with cycle 2 on 10/23/2022 - Bone scan (10/14/2022): No metastatic disease   2.  Social/family history: - She is married and lives at home with her husband and roommate.  She has never worked.  She is current active smoker, half pack per day, for the last 28 years.  She occasionally smokes marijuana. - Mother died of lung cancer.  Father had head and neck cancer.  Maternal grandmother had cancer.  Paternal uncle died of cancer.  Maternal uncle also had cancer.     Plan: 1.  Metastatic cervical cancer to the lungs, peritoneum and liver: - CT CAP (05/08/2023): Decrease in size of retroperitoneal lymph nodes, now subcentimeter with no persistent peritoneal nodularity. - She tolerated her last treatment reasonably well. - Labs today: Normal LFTs.  CBC grossly normal. - She does not report any immunotherapy related side effects. - She may proceed with carboplatin, bevacizumab and pembrolizumab today.  RTC 3 weeks for follow-up.   2.  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  Last transfusion on 06/01/2023.  Hemoglobin today is 8.4.   3.  Peripheral neuropathy: - Continue gabapentin 600 mg 3 times daily.  Take hydrocodone 5/325 as needed if pain not controlled.   4.  Hypertension: - Continue amlodipine 10 mg daily.  Blood pressure is well-controlled today at 118/84.        No orders of the defined types were placed in this encounter.     Kristin Carson,acting as a Neurosurgeon for Doreatha Massed, MD.,have documented all relevant documentation on the behalf of Doreatha Massed, MD,as directed by  Doreatha Massed, MD while in the presence of Doreatha Massed, MD.  ***        [name]   11/13/20248:29 PM  CHIEF COMPLAINT:   Diagnosis: metastatic cervical squamous cell carcinoma    Cancer Staging  Cervical cancer [facility]) Staging form: Cervix Uteri, AJCC Version 9 - Clinical stage from 09/19/2022: FIGO Stage IVB (cT4, cN2a, cM1) - Unsigned    Prior Therapy: none  Current Therapy:  Cisplatin + Paclitaxel + Bevacizumab, Keytruda   HISTORY OF PRESENT ILLNESS:   Oncology  History Overview Note  PD-L1 CPS 8%   Cervical cancer (HCC)  09/19/2022 Initial Diagnosis   Cervical cancer (HCC)   10/02/2022 -  Chemotherapy   [name]        INTERVAL HISTORY:   Kristin Carson is a 52 y.o. female presenting to  clinic today for follow up of metastatic cervical squamous cell carcinoma. She was last seen by me on 06/25/23.  Since her last visit, she was seen in the ED on 07/02/23 for a fall. No acute intracranial abnormalities were found on CT head.   Today, she states that she is doing well overall. Her appetite level is at ***%. Her energy level is at ***%. She is accompanied by her husband.   PAST MEDICAL HISTORY:   Past Medical History: Past Medical History:  Diagnosis Date   Anemia    Asthma    Bronchitis    Cancer (HCC)    Cervical   CHF (congestive heart failure) (HCC)    COPD (chronic obstructive pulmonary disease) (HCC)    Depression    GERD (gastroesophageal reflux disease)    Headache    Hypertension    Port-A-Cath in place 09/25/2022    Surgical History: Past Surgical History:  Procedure Laterality Date   BREAST SURGERY     CESAREAN SECTION     IR IMAGING GUIDED PORT INSERTION  09/30/2022   LEG SURGERY      Social History: Social History   Socioeconomic History   Marital status: Married    Spouse name: Not on file   Number of children: Not on file   Years of education: Not on file   Highest education level: Not on file  Occupational History   Not on file  Tobacco Use   Smoking status: Every Day    Current packs/day: 0.25    Average packs/day: 0.3 packs/day for 23.0 years (5.8 ttl pk-yrs)    Types: Cigarettes   Smokeless tobacco: Never  Vaping Use   Vaping status: Never Used  Substance and Sexual Activity   Alcohol use: Yes    Comment: occ   Drug use: Yes    Types: Marijuana   Sexual activity: Yes    Birth control/protection: None  Other Topics Concern   Not on file  Social History Narrative   Not on file   Social Determinants of Health   Financial Resource Strain: Low Risk  (09/09/2022)   Overall Financial Resource Strain (CARDIA)    Difficulty of Paying Living Expenses: Not hard at all  Food Insecurity: No Food Insecurity (09/22/2022)   Hunger  Vital Sign    Worried About Running Out of Food in the Last Year: Never true    Ran Out of Food in the Last Year: Never true  Transportation Needs: No Transportation Needs (09/22/2022)   PRAPARE - Administrator, Civil Service (Medical): No    Lack of Transportation (Non-Medical): No  Recent Concern: Transportation Needs - Unmet Transportation Needs (09/09/2022)   PRAPARE - Transportation    Lack of Transportation (Medical): Yes    Lack of Transportation (Non-Medical): Yes  Physical Activity: Insufficiently Active (09/09/2022)   Exercise Vital Sign    Days of Exercise per Week: 2 days    Minutes of Exercise per Session: 20 min  Stress: No Stress Concern Present (09/09/2022)   [employer] of Occupational Health - Occupational Stress Questionnaire  Feeling of Stress : Only a little  Social Connections: Socially Isolated (09/09/2022)   Social Connection and Isolation Panel [NHANES]    Frequency of Communication with Friends and Family: Three times a week    Frequency of Social Gatherings with Friends and Family: More than three times a week    Attends Religious Services: Never    Database administrator or Organizations: No    Attends Banker Meetings: Never    Marital Status: Separated  Intimate Partner Violence: Not At Risk (09/22/2022)   Humiliation, Afraid, Rape, and Kick questionnaire    Fear of Current or Ex-Partner: No    Emotionally Abused: No    Physically Abused: No    Sexually Abused: No    Family History: Family History  Problem Relation Age of Onset   Dermatomyositis Mother    Hypertension Mother    Cancer Mother        lung   Cancer Father        lung   Heart disease Father    Lung cancer Paternal Uncle    Lung cancer Maternal Uncle    Colon cancer Neg Hx    Breast cancer Neg Hx    Ovarian cancer Neg Hx    Endometrial cancer Neg Hx    Pancreatic cancer Neg Hx    Prostate cancer Neg Hx     Current Medications:  Current Outpatient  Medications:    albuterol (VENTOLIN HFA) 108 (90 Base) MCG/ACT inhaler, Inhale 2 puffs into the lungs every 6 (six) hours as needed for wheezing or shortness of breath., Disp: 8 g, Rfl: 2   amLODipine (NORVASC) 10 MG tablet, Take 1 tablet (10 mg total) by mouth daily., Disp: 30 tablet, Rfl: 2   Atezolizumab (TECENTRIQ IV), Inject into the vein every 21 ( twenty-one) days., Disp: , Rfl:    Bevacizumab (AVASTIN IV), Inject into the vein every 21 ( twenty-one) days., Disp: , Rfl:    cetirizine (ZYRTEC ALLERGY) 10 MG tablet, Take 1 tablet (10 mg total) by mouth daily., Disp: 5 tablet, Rfl: 0   HYDROcodone-acetaminophen (NORCO/VICODIN) 5-325 MG tablet, Take 1 tablet by mouth every 6 (six) hours as needed for moderate pain (pain score 4-6)., Disp: 120 tablet, Rfl: 0   lidocaine-prilocaine (EMLA) cream, Apply a dime-sized amount to port a cath site and cover with plastic wrap 1 hour prior to infusion appointments, Disp: 30 g, Rfl: 3   loperamide (IMODIUM) 2 MG capsule, Take 1 capsule (2 mg total) by mouth as needed for diarrhea or loose stools (Take 2 capsules after the first loose stool and 1 capsule after each loose stool thereafter.  DO NOT EXCEED 8 capsules a day.)., Disp: 30 capsule, Rfl: 0   megestrol (MEGACE) 400 MG/10ML suspension, Take 10 mLs (400 mg total) by mouth 2 (two) times daily., Disp: 480 mL, Rfl: 3   naproxen sodium (ALEVE) 220 MG tablet, Take 220 mg by mouth daily as needed (pain)., Disp: , Rfl:    nicotine polacrilex (NICORETTE) 4 MG gum, Take 1 each (4 mg total) by mouth as needed for smoking cessation., Disp: 100 tablet, Rfl: 0   ondansetron (ZOFRAN) 8 MG tablet, Take 1 tablet (8 mg total) by mouth every 8 (eight) hours as needed for nausea or vomiting. Start on the third day after cisplatin., Disp: 30 tablet, Rfl: 1   pantoprazole (PROTONIX) 40 MG tablet, Take 1 tablet (40 mg total) by mouth daily., Disp: 90 tablet, Rfl: 1  pregabalin (LYRICA) 200 MG capsule, Take 1 capsule (200 mg  total) by mouth 3 (three) times daily., Disp: 180 capsule, Rfl: 0   prochlorperazine (COMPAZINE) 10 MG tablet, Take 1 tablet (10 mg total) by mouth every 6 (six) hours as needed (Nausea or vomiting)., Disp: 30 tablet, Rfl: 1   Allergies: Allergies  Allergen Reactions   Carrot [Daucus Carota] Hives   Lisinopril-Hydrochlorothiazide     Oral swelling   Ibuprofen Other (See Comments)    Oral swelling   Other Itching and Other (See Comments)    Hair dye, blisters, pus-filled, soreness   Tramadol Hives   Erythromycin Hives    REVIEW OF SYSTEMS:   Review of Systems  Constitutional:  Negative for chills, fatigue and fever.  HENT:   Negative for lump/mass, mouth sores, nosebleeds, sore throat and trouble swallowing.   Eyes:  Negative for eye problems.  Respiratory:  Negative for cough and shortness of breath.   Cardiovascular:  Negative for chest pain, leg swelling and palpitations.  Gastrointestinal:  Negative for abdominal pain, constipation, diarrhea, nausea and vomiting.  Genitourinary:  Negative for bladder incontinence, difficulty urinating, dysuria, frequency, hematuria and nocturia.   Musculoskeletal:  Negative for arthralgias, back pain, flank pain, myalgias and neck pain.  Skin:  Negative for itching and rash.  Neurological:  Negative for dizziness, headaches and numbness.  Hematological:  Does not bruise/bleed easily.  Psychiatric/Behavioral:  Negative for depression, sleep disturbance and suicidal ideas. The patient is not nervous/anxious.   All other systems reviewed and are negative.    VITALS:   Last menstrual period 08/15/2022.  Wt Readings from Last 3 Encounters:  06/25/23 174 lb 14.4 oz (79.3 kg)  06/09/23 164 lb 9.6 oz (74.7 kg)  06/01/23 162 lb 14.7 oz (73.9 kg)    There is no height or weight on file to calculate BMI.  Performance status (ECOG): 1 - Symptomatic but completely ambulatory  PHYSICAL EXAM:   Physical Exam Vitals and nursing note reviewed.  Exam conducted with a chaperone present.  Constitutional:      Appearance: Normal appearance.  Cardiovascular:     Rate and Rhythm: Normal rate and regular rhythm.     Pulses: Normal pulses.     Heart sounds: Normal heart sounds.  Pulmonary:     Effort: Pulmonary effort is normal.     Breath sounds: Normal breath sounds.  Abdominal:     Palpations: Abdomen is soft. There is no hepatomegaly, splenomegaly or mass.     Tenderness: There is no abdominal tenderness.  Musculoskeletal:     Right lower leg: No edema.     Left lower leg: No edema.  Lymphadenopathy:     Cervical: No cervical adenopathy.     Right cervical: No superficial, deep or posterior cervical adenopathy.    Left cervical: No superficial, deep or posterior cervical adenopathy.     Upper Body:     Right upper body: No supraclavicular or axillary adenopathy.     Left upper body: No supraclavicular or axillary adenopathy.  Neurological:     General: No focal deficit present.     Mental Status: She is alert and oriented to person, place, and time.  Psychiatric:        Mood and Affect: Mood normal.        Behavior: Behavior normal.     LABS:      Latest Ref Rng & Units 06/25/2023    9:26 AM 06/09/2023    9:07 AM 06/01/2023  9:36 AM  CBC  WBC 4.0 - 10.5 K/uL 5.4  3.6  4.3   Hemoglobin 12.0 - 15.0 g/dL 8.4  8.3  6.8   Hematocrit 36.0 - 46.0 % 26.3  25.9  20.6   Platelets 150 - 400 K/uL 206  69  52       Latest Ref Rng & Units 06/25/2023    9:26 AM 06/09/2023    9:07 AM 06/01/2023    9:09 AM  CMP  Glucose 70 - 99 mg/dL 811  914  86   BUN 6 - 20 mg/dL 18  12  10    Creatinine 0.44 - 1.00 mg/dL 7.82  9.56  2.13   Sodium 135 - 145 mmol/L 135  135  136   Potassium 3.5 - 5.1 mmol/L 3.8  3.8  3.8   Chloride 98 - 111 mmol/L 105  101  103   CO2 22 - 32 mmol/L 24  23  19    Calcium 8.9 - 10.3 mg/dL 8.6  8.7  9.1   Total Protein 6.5 - 8.1 g/dL 7.1  7.5  8.1   Total Bilirubin 0.3 - 1.2 mg/dL 0.3  0.3  0.8   Alkaline  Phos 38 - 126 U/L 36  51  58   AST 15 - 41 U/L 17  28  43   ALT 0 - 44 U/L 13  17  25       No results found for: "CEA1", "CEA" / No results found for: "CEA1", "CEA" No results found for: "PSA1" No results found for: "YQM578" No results found for: "CAN125"  No results found for: "TOTALPROTELP", "ALBUMINELP", "A1GS", "A2GS", "BETS", "BETA2SER", "GAMS", "MSPIKE", "SPEI" Lab Results  Component Value Date   TIBC 412 09/22/2022   FERRITIN 35 09/22/2022   IRONPCTSAT 7 (L) 09/22/2022   No results found for: "LDH"   STUDIES:   CT Head Wo Contrast  Result Date: 07/02/2023 CLINICAL DATA:  Head trauma, moderate-severe EXAM: CT HEAD WITHOUT CONTRAST TECHNIQUE: Contiguous axial images were obtained from the base of the skull through the vertex without intravenous contrast. RADIATION DOSE REDUCTION: This exam was performed according to the departmental dose-optimization program which includes automated exposure control, adjustment of the mA and/or kV according to patient size and/or use of iterative reconstruction technique. COMPARISON:  CT head May 15, 24. FINDINGS: Brain: No evidence of acute infarction, hemorrhage, hydrocephalus, extra-axial collection or mass lesion/mass effect. Vascular: No hyperdense vessel. Skull: No acute fracture. Sinuses/Orbits: Mild paranasal sinus mucosal thickening. No acute orbital findings. Other: No mastoid effusions. IMPRESSION: No evidence of acute intracranial abnormality. Electronically Signed   By: Feliberto Harts M.D.   On: 07/02/2023 11:27

## 2023-07-16 ENCOUNTER — Inpatient Hospital Stay: Payer: Medicaid Other | Attending: Hematology

## 2023-07-16 ENCOUNTER — Inpatient Hospital Stay: Payer: Medicaid Other | Admitting: Hematology

## 2023-07-16 ENCOUNTER — Inpatient Hospital Stay: Payer: Medicaid Other

## 2023-07-18 ENCOUNTER — Other Ambulatory Visit: Payer: Self-pay | Admitting: Internal Medicine

## 2023-07-21 ENCOUNTER — Inpatient Hospital Stay: Payer: Medicaid Other | Admitting: Hematology

## 2023-07-21 ENCOUNTER — Inpatient Hospital Stay: Payer: Medicaid Other

## 2023-07-28 ENCOUNTER — Inpatient Hospital Stay: Payer: Medicaid Other | Admitting: Hematology

## 2023-07-28 ENCOUNTER — Inpatient Hospital Stay: Payer: Medicaid Other

## 2023-08-02 DIAGNOSIS — Z419 Encounter for procedure for purposes other than remedying health state, unspecified: Secondary | ICD-10-CM | POA: Diagnosis not present

## 2023-08-04 NOTE — Progress Notes (Signed)
[facility] [address], [place] [address]    Clinic Day:  08/06/23     Referring physician: Billie Lade, MD  Patient Care Team: Billie Lade, MD as PCP - General (Internal Medicine) Doreatha Massed, MD as Medical Oncologist (Medical Oncology) Therese Sarah, RN as Oncology Nurse Navigator (Medical Oncology)   ASSESSMENT & PLAN:   Assessment: 1.  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 - CTAP (08/30/2022): Irregularity of the endometrium extending through the cervix with foci of gas in the cervix.  Multifocal omental and peritoneal metastasis.  Metastatic left periaortic, aortocaval, left external iliac and right common iliac lymph nodes.  Multiple bilateral lung nodules at bases.  12 mm hypodensity in the liver near the falciform ligament. - Endometrial biopsy (09/09/2022): Invasive moderately to poorly differentiated squamous cell carcinoma with abundant necrosis.  Cervix biopsy: Invasive and in situ moderate to poorly differentiated squamous cell carcinoma.  High risk HPV positive. - NGS: PD-L1 (22 C3): CPS 2, HER2 IHC negative.  PIK3CA exon 10 pathogenic variant.  MS-stable.  TMB-low.  FANCM and SDHA pathogenic variants. - PD-L1 CPS: 8% - Cycle 1 of cisplatin, paclitaxel, bevacizumab on 10/02/2022, pembrolizumab added with cycle 2 on 10/23/2022 - Bone scan (10/14/2022): No metastatic disease   2.  Social/family history: - She is married and lives at home with her husband and roommate.  She has never worked.  She is current active smoker, half pack per day, for the last 28 years.  She occasionally smokes marijuana. - Mother died of lung cancer.  Father had head and neck cancer.  Maternal grandmother had cancer.  Paternal uncle died of cancer.  Maternal uncle also had cancer.     Plan: 1.  Metastatic cervical cancer to the lungs, peritoneum and liver: - CT CAP (05/08/2023): Decrease in size of retroperitoneal nodes, now subcentimeter with no persistent peritoneal nodularity. - She has tolerated last cycle reasonably well. - She reported cough with greenish phlegm and sinus pressure in the left frontal area for the last 1 and half week. - We will give her Augmentin 875 mg twice daily for 7 days. - Reviewed labs today: Normal LFTs.  Creatinine 1.08.  CBC was grossly normal. - She may proceed with her treatment today and in 3 weeks with carboplatin, bevacizumab and pembrolizumab.  RTC 6 weeks for follow-up with repeat CT CAP with contrast. - As she had endometrial cancer at less than 34 years of age, she will qualify for genetic testing.  We will talk to her at next visit.   2.  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  Hemoglobin today is 9.6.  No indication for transfusion.   3.  Peripheral neuropathy: - Continue gabapentin 600 mg 3 times daily.  Take hydrocodone 5/325 as needed.   4.  Hypertension: - Continue amlodipine 10 mg daily.  Blood pressure is 140/80.        Orders Placed This Encounter  Procedures   CT CHEST ABDOMEN PELVIS W CONTRAST    Standing Status:   Future    Standing Expiration Date:   08/05/2024    Order Specific Question:   If indicated for the ordered procedure, I authorize the administration of contrast media per Radiology protocol    Answer:   Yes    Order Specific Question:  Does the patient have a contrast media/X-ray dye allergy?    Answer:   No    Order Specific Question:   Preferred imaging location?    Answer:   [facility]    Order Specific Question:   Release to patient    Answer:   Immediate    Order Specific Question:   If indicated for the ordered procedure, I authorize the administration of oral contrast media per Radiology protocol    Answer:   Yes      I,Helena R Teague,acting as a scribe for  Doreatha Massed, MD.,have documented all relevant documentation on the behalf of Doreatha Massed, MD,as directed by  Doreatha Massed, MD while in the presence of Doreatha Massed, MD.  I, Doreatha Massed MD, have reviewed the above documentation for accuracy and completeness, and I agree with the above.         Doreatha Massed, MD   12/5/20245:35 PM  CHIEF COMPLAINT:   Diagnosis: metastatic cervical squamous cell carcinoma    Cancer Staging  Cervical cancer [facility]) Staging form: Cervix Uteri, AJCC Version 9 - Clinical stage from 09/19/2022: FIGO Stage IVB (cT4, cN2a, cM1) - Unsigned    Prior Therapy: none  Current Therapy:  Cisplatin + Paclitaxel + Bevacizumab, Keytruda   HISTORY OF PRESENT ILLNESS:   Oncology History Overview Note  PD-L1 CPS 8%   Cervical cancer (HCC)  09/19/2022 Initial Diagnosis   Cervical cancer (HCC)   10/02/2022 -  Chemotherapy   [name]        INTERVAL HISTORY:   Tere is a 52 y.o. female presenting to clinic today for follow up of metastatic cervical squamous cell carcinoma. She was last seen by me on 06/25/23.  Since her last visit, she presented to the ED on 07/02/23 for a fall.   Today, she states that she is doing well overall. Her appetite level is at 100%. Her energy level is at 50%.  She is accompanied by her husband.   She notes her cough is occasionally wheezy and producing green phlegm for the past 1.5 weeks, worsened from baseline cough. She has associated SOB and is currently smoking 0.5 ppd. Her skin rash from last visit has resolved. She reports her left eye is draining, which is causing her pain on the left side of her face. She denies any allergies to antibiotics other than azithromycin. She notes stable peripheral neuropathy and is taking Gabapentin as prescribed.   PAST MEDICAL HISTORY:    Past Medical History: Past Medical History:  Diagnosis Date   Anemia    Asthma    Bronchitis    Cancer (HCC)    Cervical   CHF (congestive heart failure) (HCC)    COPD (chronic obstructive pulmonary disease) (HCC)    Depression    GERD (gastroesophageal reflux disease)    Headache    Hypertension    Port-A-Cath in place 09/25/2022    Surgical History: Past Surgical History:  Procedure Laterality Date   BREAST SURGERY     CESAREAN SECTION     IR IMAGING GUIDED PORT INSERTION  09/30/2022   LEG SURGERY      Social History: Social History   Socioeconomic History   Marital status: Married    Spouse name: Not on file   Number of children: Not on file   Years of education: Not on file   Highest  education level: Not on file  Occupational History   Not on file  Tobacco Use   Smoking status: Every Day    Current packs/day: 0.25    Average packs/day: 0.3 packs/day for 23.0 years (5.8 ttl pk-yrs)    Types: Cigarettes   Smokeless tobacco: Never  Vaping Use   Vaping status: Never Used  Substance and Sexual Activity   Alcohol use: Yes    Comment: occ   Drug use: Yes    Types: Marijuana   Sexual activity: Yes    Birth control/protection: None  Other Topics Concern   Not on file  Social History Narrative   Not on file   Social Determinants of Health   Financial Resource Strain: Low Risk  (09/09/2022)   Overall Financial Resource Strain (CARDIA)    Difficulty of Paying Living Expenses: Not hard at all  Food Insecurity: No Food Insecurity (09/22/2022)   Hunger Vital Sign    Worried About Running Out of Food in the Last Year: Never true    Ran Out of Food in the Last Year: Never true  Transportation Needs: No Transportation Needs (09/22/2022)   PRAPARE - Administrator, Civil Service (Medical): No    Lack of Transportation (Non-Medical): No  Recent Concern: Transportation Needs - Unmet Transportation Needs (09/09/2022)   PRAPARE - Transportation    Lack of  Transportation (Medical): Yes    Lack of Transportation (Non-Medical): Yes  Physical Activity: Insufficiently Active (09/09/2022)   Exercise Vital Sign    Days of Exercise per Week: 2 days    Minutes of Exercise per Session: 20 min  Stress: No Stress Concern Present (09/09/2022)   [employer] of Occupational Health - Occupational Stress Questionnaire    Feeling of Stress : Only a little  Social Connections: Socially Isolated (09/09/2022)   Social Connection and Isolation Panel [NHANES]    Frequency of Communication with Friends and Family: Three times a week    Frequency of Social Gatherings with Friends and Family: More than three times a week    Attends Religious Services: Never    Database administrator or Organizations: No    Attends Banker Meetings: Never    Marital Status: Separated  Intimate Partner Violence: Not At Risk (09/22/2022)   Humiliation, Afraid, Rape, and Kick questionnaire    Fear of Current or Ex-Partner: No    Emotionally Abused: No    Physically Abused: No    Sexually Abused: No    Family History: Family History  Problem Relation Age of Onset   Dermatomyositis Mother    Hypertension Mother    Cancer Mother        lung   Cancer Father        lung   Heart disease Father    Lung cancer Paternal Uncle    Lung cancer Maternal Uncle    Colon cancer Neg Hx    Breast cancer Neg Hx    Ovarian cancer Neg Hx    Endometrial cancer Neg Hx    Pancreatic cancer Neg Hx    Prostate cancer Neg Hx     Current Medications:  Current Outpatient Medications:    albuterol (VENTOLIN HFA) 108 (90 Base) MCG/ACT inhaler, Inhale 2 puffs into the lungs every 6 (six) hours as needed for wheezing or shortness of breath., Disp: 8 g, Rfl: 2   amLODipine (NORVASC) 10 MG tablet, Take 1 tablet by mouth once daily, Disp: 30 tablet, Rfl: 0  amoxicillin-clavulanate (AUGMENTIN) 875-125 MG tablet, Take 1 tablet by mouth 2 (two) times daily., Disp: 14 tablet, Rfl: 0    Atezolizumab (TECENTRIQ IV), Inject into the vein every 21 ( twenty-one) days., Disp: , Rfl:    Bevacizumab (AVASTIN IV), Inject into the vein every 21 ( twenty-one) days., Disp: , Rfl:    cetirizine (ZYRTEC ALLERGY) 10 MG tablet, Take 1 tablet (10 mg total) by mouth daily., Disp: 5 tablet, Rfl: 0   HYDROcodone-acetaminophen (NORCO/VICODIN) 5-325 MG tablet, Take 1 tablet by mouth every 6 (six) hours as needed for moderate pain (pain score 4-6)., Disp: 120 tablet, Rfl: 0   loperamide (IMODIUM) 2 MG capsule, Take 1 capsule (2 mg total) by mouth as needed for diarrhea or loose stools (Take 2 capsules after the first loose stool and 1 capsule after each loose stool thereafter.  DO NOT EXCEED 8 capsules a day.)., Disp: 30 capsule, Rfl: 0   megestrol (MEGACE) 400 MG/10ML suspension, Take 10 mLs (400 mg total) by mouth 2 (two) times daily., Disp: 480 mL, Rfl: 3   naproxen sodium (ALEVE) 220 MG tablet, Take 220 mg by mouth daily as needed (pain)., Disp: , Rfl:    nicotine polacrilex (NICORETTE) 4 MG gum, Take 1 each (4 mg total) by mouth as needed for smoking cessation., Disp: 100 tablet, Rfl: 0   pantoprazole (PROTONIX) 40 MG tablet, Take 1 tablet (40 mg total) by mouth daily., Disp: 90 tablet, Rfl: 1   pregabalin (LYRICA) 200 MG capsule, Take 1 capsule (200 mg total) by mouth 3 (three) times daily., Disp: 180 capsule, Rfl: 0   lidocaine-prilocaine (EMLA) cream, Apply a dime-sized amount to port a cath site and cover with plastic wrap 1 hour prior to infusion appointments, Disp: 30 g, Rfl: 3   ondansetron (ZOFRAN) 8 MG tablet, Take 1 tablet (8 mg total) by mouth every 8 (eight) hours as needed for nausea or vomiting. Start on the third day after cisplatin., Disp: 30 tablet, Rfl: 1   prochlorperazine (COMPAZINE) 10 MG tablet, Take 1 tablet (10 mg total) by mouth every 6 (six) hours as needed (Nausea or vomiting)., Disp: 30 tablet, Rfl: 1 No current facility-administered medications for this  visit.  Facility-Administered Medications Ordered in Other Visits:    sodium chloride flush (NS) 0.9 % injection 10 mL, 10 mL, Intracatheter, PRN, Doreatha Massed, MD, 10 mL at 08/06/23 1404   Allergies: Allergies  Allergen Reactions   Carrot [Daucus Carota] Hives   Lisinopril-Hydrochlorothiazide     Oral swelling   Ibuprofen Other (See Comments)    Oral swelling   Other Itching and Other (See Comments)    Hair dye, blisters, pus-filled, soreness   Tramadol Hives   Erythromycin Hives    REVIEW OF SYSTEMS:   Review of Systems  Constitutional:  Negative for chills, fatigue and fever.  HENT:   Negative for lump/mass, mouth sores, nosebleeds, sore throat and trouble swallowing.        +rhinorrhea +left eye epiphora  Eyes:  Negative for eye problems.  Respiratory:  Positive for cough and shortness of breath.   Cardiovascular:  Negative for chest pain, leg swelling and palpitations.  Gastrointestinal:  Negative for abdominal pain, constipation, diarrhea, nausea and vomiting.  Genitourinary:  Negative for bladder incontinence, difficulty urinating, dysuria, frequency, hematuria and nocturia.   Musculoskeletal:  Negative for arthralgias, back pain, flank pain, myalgias and neck pain.  Skin:  Negative for itching and rash.  Neurological:  Positive for headaches and numbness (in feet  and hands, 7/10 severity). Negative for dizziness.  Hematological:  Does not bruise/bleed easily.  Psychiatric/Behavioral:  Negative for depression, sleep disturbance and suicidal ideas. The patient is not nervous/anxious.   All other systems reviewed and are negative.    VITALS:   Weight 173 lb 6.4 oz (78.7 kg), last menstrual period 08/15/2022.  Wt Readings from Last 3 Encounters:  08/06/23 173 lb 6.4 oz (78.7 kg)  06/25/23 174 lb 14.4 oz (79.3 kg)  06/09/23 164 lb 9.6 oz (74.7 kg)    Body mass index is 30.23 kg/m.  Performance status (ECOG): 1 - Symptomatic but completely  ambulatory  PHYSICAL EXAM:   Physical Exam Vitals and nursing note reviewed. Exam conducted with a chaperone present.  Constitutional:      Appearance: Normal appearance.  HENT:     Head:     Comments: +Left frontal sinus tenderness Cardiovascular:     Rate and Rhythm: Normal rate and regular rhythm.     Pulses: Normal pulses.     Heart sounds: Normal heart sounds.  Pulmonary:     Effort: Pulmonary effort is normal.     Breath sounds: Normal breath sounds.  Abdominal:     Palpations: Abdomen is soft. There is no hepatomegaly, splenomegaly or mass.     Tenderness: There is no abdominal tenderness.  Musculoskeletal:     Right lower leg: No edema.     Left lower leg: No edema.  Lymphadenopathy:     Cervical: No cervical adenopathy.     Right cervical: No superficial, deep or posterior cervical adenopathy.    Left cervical: No superficial, deep or posterior cervical adenopathy.     Upper Body:     Right upper body: No supraclavicular or axillary adenopathy.     Left upper body: No supraclavicular or axillary adenopathy.  Neurological:     General: No focal deficit present.     Mental Status: She is alert and oriented to person, place, and time.  Psychiatric:        Mood and Affect: Mood normal.        Behavior: Behavior normal.     LABS:      Latest Ref Rng & Units 08/06/2023    8:46 AM 06/25/2023    9:26 AM 06/09/2023    9:07 AM  CBC  WBC 4.0 - 10.5 K/uL 5.0  5.4  3.6   Hemoglobin 12.0 - 15.0 g/dL 9.6  8.4  8.3   Hematocrit 36.0 - 46.0 % 30.2  26.3  25.9   Platelets 150 - 400 K/uL 182  206  69       Latest Ref Rng & Units 08/06/2023    8:46 AM 06/25/2023    9:26 AM 06/09/2023    9:07 AM  CMP  Glucose 70 - 99 mg/dL 621  308  657   BUN 6 - 20 mg/dL 28  18  12    Creatinine 0.44 - 1.00 mg/dL 8.46  9.62  9.52   Sodium 135 - 145 mmol/L 135  135  135   Potassium 3.5 - 5.1 mmol/L 4.2  3.8  3.8   Chloride 98 - 111 mmol/L 104  105  101   CO2 22 - 32 mmol/L 23  24  23     Calcium 8.9 - 10.3 mg/dL 8.9  8.6  8.7   Total Protein 6.5 - 8.1 g/dL 7.5  7.1  7.5   Total Bilirubin <1.2 mg/dL 0.3  0.3  0.3   Alkaline Phos  38 - 126 U/L 56  36  51   AST 15 - 41 U/L 29  17  28    ALT 0 - 44 U/L 26  13  17       No results found for: "CEA1", "CEA" / No results found for: "CEA1", "CEA" No results found for: "PSA1" No results found for: "NWG956" No results found for: "CAN125"  No results found for: "TOTALPROTELP", "ALBUMINELP", "A1GS", "A2GS", "BETS", "BETA2SER", "GAMS", "MSPIKE", "SPEI" Lab Results  Component Value Date   TIBC 412 09/22/2022   FERRITIN 35 09/22/2022   IRONPCTSAT 7 (L) 09/22/2022   No results found for: "LDH"   STUDIES:   No results found.

## 2023-08-06 ENCOUNTER — Other Ambulatory Visit: Payer: Self-pay | Admitting: *Deleted

## 2023-08-06 ENCOUNTER — Inpatient Hospital Stay: Payer: Medicaid Other | Attending: Hematology

## 2023-08-06 ENCOUNTER — Inpatient Hospital Stay: Payer: Medicaid Other

## 2023-08-06 ENCOUNTER — Inpatient Hospital Stay (HOSPITAL_BASED_OUTPATIENT_CLINIC_OR_DEPARTMENT_OTHER): Payer: Medicaid Other | Admitting: Hematology

## 2023-08-06 VITALS — BP 138/89 | HR 93 | Temp 98.2°F | Resp 18

## 2023-08-06 VITALS — BP 142/80 | HR 104 | Temp 98.3°F | Resp 19

## 2023-08-06 VITALS — Wt 173.4 lb

## 2023-08-06 DIAGNOSIS — C7801 Secondary malignant neoplasm of right lung: Secondary | ICD-10-CM | POA: Diagnosis not present

## 2023-08-06 DIAGNOSIS — D649 Anemia, unspecified: Secondary | ICD-10-CM | POA: Diagnosis not present

## 2023-08-06 DIAGNOSIS — Z5111 Encounter for antineoplastic chemotherapy: Secondary | ICD-10-CM | POA: Insufficient documentation

## 2023-08-06 DIAGNOSIS — C787 Secondary malignant neoplasm of liver and intrahepatic bile duct: Secondary | ICD-10-CM | POA: Insufficient documentation

## 2023-08-06 DIAGNOSIS — Z95828 Presence of other vascular implants and grafts: Secondary | ICD-10-CM

## 2023-08-06 DIAGNOSIS — Z808 Family history of malignant neoplasm of other organs or systems: Secondary | ICD-10-CM | POA: Insufficient documentation

## 2023-08-06 DIAGNOSIS — Z7689 Persons encountering health services in other specified circumstances: Secondary | ICD-10-CM | POA: Diagnosis not present

## 2023-08-06 DIAGNOSIS — F17210 Nicotine dependence, cigarettes, uncomplicated: Secondary | ICD-10-CM | POA: Diagnosis not present

## 2023-08-06 DIAGNOSIS — C7802 Secondary malignant neoplasm of left lung: Secondary | ICD-10-CM | POA: Insufficient documentation

## 2023-08-06 DIAGNOSIS — G629 Polyneuropathy, unspecified: Secondary | ICD-10-CM | POA: Insufficient documentation

## 2023-08-06 DIAGNOSIS — Z801 Family history of malignant neoplasm of trachea, bronchus and lung: Secondary | ICD-10-CM | POA: Insufficient documentation

## 2023-08-06 DIAGNOSIS — I10 Essential (primary) hypertension: Secondary | ICD-10-CM | POA: Diagnosis not present

## 2023-08-06 DIAGNOSIS — C538 Malignant neoplasm of overlapping sites of cervix uteri: Secondary | ICD-10-CM

## 2023-08-06 DIAGNOSIS — C786 Secondary malignant neoplasm of retroperitoneum and peritoneum: Secondary | ICD-10-CM | POA: Insufficient documentation

## 2023-08-06 DIAGNOSIS — Z5112 Encounter for antineoplastic immunotherapy: Secondary | ICD-10-CM | POA: Insufficient documentation

## 2023-08-06 DIAGNOSIS — C541 Malignant neoplasm of endometrium: Secondary | ICD-10-CM | POA: Diagnosis not present

## 2023-08-06 LAB — CBC WITH DIFFERENTIAL/PLATELET
Abs Immature Granulocytes: 0.02 10*3/uL (ref 0.00–0.07)
Basophils Absolute: 0 10*3/uL (ref 0.0–0.1)
Basophils Relative: 0 %
Eosinophils Absolute: 0 10*3/uL (ref 0.0–0.5)
Eosinophils Relative: 1 %
HCT: 30.2 % — ABNORMAL LOW (ref 36.0–46.0)
Hemoglobin: 9.6 g/dL — ABNORMAL LOW (ref 12.0–15.0)
Immature Granulocytes: 0 %
Lymphocytes Relative: 39 %
Lymphs Abs: 2 10*3/uL (ref 0.7–4.0)
MCH: 37.9 pg — ABNORMAL HIGH (ref 26.0–34.0)
MCHC: 31.8 g/dL (ref 30.0–36.0)
MCV: 119.4 fL — ABNORMAL HIGH (ref 80.0–100.0)
Monocytes Absolute: 0.7 10*3/uL (ref 0.1–1.0)
Monocytes Relative: 15 %
Neutro Abs: 2.3 10*3/uL (ref 1.7–7.7)
Neutrophils Relative %: 45 %
Platelets: 182 10*3/uL (ref 150–400)
RBC: 2.53 MIL/uL — ABNORMAL LOW (ref 3.87–5.11)
RDW: 16.4 % — ABNORMAL HIGH (ref 11.5–15.5)
WBC: 5 10*3/uL (ref 4.0–10.5)
nRBC: 0 % (ref 0.0–0.2)

## 2023-08-06 LAB — URINALYSIS, DIPSTICK ONLY
Bilirubin Urine: NEGATIVE
Glucose, UA: NEGATIVE mg/dL
Hgb urine dipstick: NEGATIVE
Ketones, ur: NEGATIVE mg/dL
Leukocytes,Ua: NEGATIVE
Nitrite: NEGATIVE
Protein, ur: 30 mg/dL — AB
Specific Gravity, Urine: 1.021 (ref 1.005–1.030)
pH: 6 (ref 5.0–8.0)

## 2023-08-06 LAB — COMPREHENSIVE METABOLIC PANEL
ALT: 26 U/L (ref 0–44)
AST: 29 U/L (ref 15–41)
Albumin: 3.6 g/dL (ref 3.5–5.0)
Alkaline Phosphatase: 56 U/L (ref 38–126)
Anion gap: 8 (ref 5–15)
BUN: 28 mg/dL — ABNORMAL HIGH (ref 6–20)
CO2: 23 mmol/L (ref 22–32)
Calcium: 8.9 mg/dL (ref 8.9–10.3)
Chloride: 104 mmol/L (ref 98–111)
Creatinine, Ser: 1.08 mg/dL — ABNORMAL HIGH (ref 0.44–1.00)
GFR, Estimated: >60 mL/min (ref >60)
Glucose, Bld: 101 mg/dL — ABNORMAL HIGH (ref 70–99)
Potassium: 4.2 mmol/L (ref 3.5–5.1)
Sodium: 135 mmol/L (ref 135–145)
Total Bilirubin: 0.3 mg/dL (ref <1.2)
Total Protein: 7.5 g/dL (ref 6.5–8.1)

## 2023-08-06 LAB — MAGNESIUM: Magnesium: 2.1 mg/dL (ref 1.7–2.4)

## 2023-08-06 MED ORDER — SODIUM CHLORIDE 0.9% FLUSH
10.0000 mL | INTRAVENOUS | Status: DC | PRN
  Administered 2023-08-06: 10 mL

## 2023-08-06 MED ORDER — CETIRIZINE HCL 10 MG/ML IV SOLN
10.0000 mg | Freq: Once | INTRAVENOUS | Status: AC
  Administered 2023-08-06: 10 mg via INTRAVENOUS
  Filled 2023-08-06: qty 1

## 2023-08-06 MED ORDER — HEPARIN SOD (PORK) LOCK FLUSH 100 UNIT/ML IV SOLN
500.0000 [IU] | Freq: Once | INTRAVENOUS | Status: AC | PRN
Start: 2023-08-06 — End: 2023-08-06
  Administered 2023-08-06: 500 [IU]

## 2023-08-06 MED ORDER — HYDROCODONE-ACETAMINOPHEN 5-325 MG PO TABS
1.0000 | ORAL_TABLET | Freq: Once | ORAL | Status: AC
  Administered 2023-08-06: 1 via ORAL
  Filled 2023-08-06: qty 1

## 2023-08-06 MED ORDER — PALONOSETRON HCL INJECTION 0.25 MG/5ML
0.2500 mg | Freq: Once | INTRAVENOUS | Status: AC
  Administered 2023-08-06: 0.25 mg via INTRAVENOUS
  Filled 2023-08-06: qty 5

## 2023-08-06 MED ORDER — SODIUM CHLORIDE 0.9 % IV SOLN
200.0000 mg | Freq: Once | INTRAVENOUS | Status: AC
Start: 2023-08-06 — End: 2023-08-06
  Administered 2023-08-06: 200 mg via INTRAVENOUS
  Filled 2023-08-06: qty 8

## 2023-08-06 MED ORDER — SODIUM CHLORIDE 0.9 % IV SOLN
428.4000 mg | Freq: Once | INTRAVENOUS | Status: AC
  Administered 2023-08-06: 430 mg via INTRAVENOUS
  Filled 2023-08-06: qty 43

## 2023-08-06 MED ORDER — DEXAMETHASONE SODIUM PHOSPHATE 10 MG/ML IJ SOLN
10.0000 mg | Freq: Once | INTRAMUSCULAR | Status: AC
Start: 2023-08-06 — End: 2023-08-06
  Administered 2023-08-06: 10 mg via INTRAVENOUS
  Filled 2023-08-06: qty 1

## 2023-08-06 MED ORDER — FAMOTIDINE IN NACL 20-0.9 MG/50ML-% IV SOLN
20.0000 mg | Freq: Once | INTRAVENOUS | Status: AC
  Administered 2023-08-06: 20 mg via INTRAVENOUS
  Filled 2023-08-06: qty 50

## 2023-08-06 MED ORDER — AMOXICILLIN-POT CLAVULANATE 875-125 MG PO TABS: 1.0000 | ORAL_TABLET | Freq: Two times a day (BID) | ORAL | 0 refills | Status: DC

## 2023-08-06 MED ORDER — SODIUM CHLORIDE 0.9 % IV SOLN: Freq: Once | INTRAVENOUS | Status: AC

## 2023-08-06 MED ORDER — SODIUM CHLORIDE 0.9 % IV SOLN
150.0000 mg | Freq: Once | INTRAVENOUS | Status: AC
  Administered 2023-08-06: 150 mg via INTRAVENOUS
  Filled 2023-08-06: qty 150

## 2023-08-06 MED ORDER — SODIUM CHLORIDE 0.9% FLUSH
10.0000 mL | Freq: Once | INTRAVENOUS | Status: AC
  Administered 2023-08-06: 10 mL via INTRAVENOUS

## 2023-08-06 MED ORDER — SODIUM CHLORIDE 0.9 % IV SOLN
15.0000 mg/kg | Freq: Once | INTRAVENOUS | Status: AC
  Administered 2023-08-06: 1300 mg via INTRAVENOUS
  Filled 2023-08-06: qty 48

## 2023-08-06 MED ORDER — DEXAMETHASONE SODIUM PHOSPHATE 100 MG/10ML IJ SOLN: 10.0000 mg | Freq: Once | INTRAMUSCULAR | Status: DC

## 2023-08-06 NOTE — Patient Instructions (Signed)

## 2023-08-06 NOTE — Progress Notes (Signed)
Patient presents today for Keytruda/Vegzelma/Carboplatin infusion. Patient is in satisfactory condition with no new complaints voiced.  Vital signs are stable.  Labs reviewed by Dr. Ellin Saba during the office visit and all labs are within treatment parameters.  We will proceed with treatment per MD orders.   Treatment given today per MD orders. Tolerated infusion without adverse affects. Vital signs stable. No complaints at this time. Discharged from clinic ambulatory in stable condition. Alert and oriented x 3. F/U with [facility] as scheduled.

## 2023-08-06 NOTE — Patient Instructions (Signed)
[facility] [place] - A DEPT OF [facility]  Discharge Instructions: Thank you for choosing [facility] to provide your oncology and hematology care.  If you have a lab appointment with the Cancer Center - please note that after April 8th, 2024, all labs will be drawn in the cancer center.  You do not have to check in or register with the main entrance as you have in the past but will complete your check-in in the cancer center.  Wear comfortable clothing and clothing appropriate for easy access to any Portacath or PICC line.   We strive to give you quality time with your provider. You may need to reschedule your appointment if you arrive late (15 or more minutes).  Arriving late affects you and other patients whose appointments are after yours.  Also, if you miss three or more appointments without notifying the office, you may be dismissed from the clinic at the provider's discretion.      For prescription refill requests, have your pharmacy contact our office and allow 72 hours for refills to be completed.    Today you received the following chemotherapy and/or immunotherapy agents Keytruda/Vegzelma/Carboplatin   To help prevent nausea and vomiting after your treatment, we encourage you to take your nausea medication as directed.  BELOW ARE SYMPTOMS THAT SHOULD BE REPORTED IMMEDIATELY: *FEVER GREATER THAN 100.4 F (38 C) OR HIGHER *CHILLS OR SWEATING *NAUSEA AND VOMITING THAT IS NOT CONTROLLED WITH YOUR NAUSEA MEDICATION *UNUSUAL SHORTNESS OF BREATH *UNUSUAL BRUISING OR BLEEDING *URINARY PROBLEMS (pain or burning when urinating, or frequent urination) *BOWEL PROBLEMS (unusual diarrhea, constipation, pain near the anus) TENDERNESS IN MOUTH AND THROAT WITH OR WITHOUT PRESENCE OF ULCERS (sore throat, sores in mouth, or a toothache) UNUSUAL RASH, SWELLING OR PAIN  UNUSUAL VAGINAL DISCHARGE OR ITCHING   Items with * indicate a potential emergency and  should be followed up as soon as possible or go to the Emergency Department if any problems should occur.  Please show the CHEMOTHERAPY ALERT CARD or IMMUNOTHERAPY ALERT CARD at check-in to the Emergency Department and triage nurse.  Should you have questions after your visit or need to cancel or reschedule your appointment, please contact [facility] [place] - A DEPT OF [facility] [phone]  and follow the prompts.  Office hours are 8:00 a.m. to 4:30 p.m. Monday - Friday. Please note that voicemails left after 4:00 p.m. may not be returned until the following business day.  We are closed weekends and major holidays. You have access to a nurse at all times for urgent questions. Please call the main number to the clinic [phone] and follow the prompts.  For any non-urgent questions, you may also contact your provider using MyChart. We now offer e-Visits for anyone 18 and older to request care online for non-urgent symptoms. For details visit [email or web address].   Also download the MyChart app! Go to the app store, search "MyChart", open the app, select [place], and log in with your MyChart username and password.

## 2023-08-06 NOTE — Progress Notes (Signed)
Patient has been examined by Dr. Ellin Saba. Vital signs (HR 104) and labs have been reviewed by MD - ANC, Creatinine, LFTs, hemoglobin, and platelets are within treatment parameters per M.D. - pt may proceed with treatment.  Primary RN and pharmacy notified.

## 2023-08-10 ENCOUNTER — Other Ambulatory Visit: Payer: Self-pay

## 2023-08-10 ENCOUNTER — Other Ambulatory Visit: Payer: Self-pay | Admitting: *Deleted

## 2023-08-10 DIAGNOSIS — G893 Neoplasm related pain (acute) (chronic): Secondary | ICD-10-CM

## 2023-08-10 MED ORDER — HYDROCODONE-ACETAMINOPHEN 5-325 MG PO TABS: 1.0000 | ORAL_TABLET | Freq: Four times a day (QID) | ORAL | 0 refills | Status: DC | PRN

## 2023-08-27 ENCOUNTER — Inpatient Hospital Stay: Payer: Medicaid Other

## 2023-08-27 ENCOUNTER — Inpatient Hospital Stay: Payer: Medicaid Other | Admitting: Hematology

## 2023-08-27 ENCOUNTER — Other Ambulatory Visit: Payer: Medicaid Other

## 2023-09-02 DIAGNOSIS — Z419 Encounter for procedure for purposes other than remedying health state, unspecified: Secondary | ICD-10-CM | POA: Diagnosis not present

## 2023-09-07 ENCOUNTER — Ambulatory Visit (HOSPITAL_COMMUNITY): Admission: RE | Admit: 2023-09-07 | Payer: Medicaid Other | Source: Ambulatory Visit

## 2023-09-08 ENCOUNTER — Other Ambulatory Visit: Payer: Self-pay

## 2023-09-08 ENCOUNTER — Other Ambulatory Visit: Payer: Self-pay | Admitting: Internal Medicine

## 2023-09-08 ENCOUNTER — Other Ambulatory Visit: Payer: Self-pay | Admitting: *Deleted

## 2023-09-08 MED ORDER — AMLODIPINE BESYLATE 10 MG PO TABS: 10.0000 mg | ORAL_TABLET | Freq: Every day | ORAL | 0 refills | Status: DC

## 2023-09-08 NOTE — Telephone Encounter (Signed)
 Copied from CRM [id number]. Topic: Clinical - Medication Refill >> Sep 08, 2023  4:15 PM Ivette P wrote: Most Recent Primary Care Visit:  Provider: DIXON, PHILLIP E  Department: [facility]  Visit Type: OFFICE VISIT  Date: 05/26/2023  Medication: amLODipine  (NORVASC ) 10 MG tablet   Has the patient contacted their pharmacy? Yes (Agent: If no, request that the patient contact the pharmacy for the refill. If patient does not wish to contact the pharmacy document the reason why and proceed with request.) (Agent: If yes, when and what did the pharmacy advise?)  Is this the correct pharmacy for this prescription? Yes If no, delete pharmacy and type the correct one.  This is the patient's preferred pharmacy:  [facility] [address] [place], [place] - [address] [place] [address] [place] [address] Phone: [phone] Fax: [phone]   Has the prescription been filled recently? No  Is the patient out of the medication? Yes  Has the patient been seen for an appointment in the last year OR does the patient have an upcoming appointment? Yes  Can we respond through MyChart? No  Agent: Please be advised that Rx refills may take up to 3 business days. We ask that you follow-up with your pharmacy.

## 2023-09-09 ENCOUNTER — Other Ambulatory Visit: Payer: Self-pay | Admitting: *Deleted

## 2023-09-09 DIAGNOSIS — G893 Neoplasm related pain (acute) (chronic): Secondary | ICD-10-CM

## 2023-09-09 DIAGNOSIS — G622 Polyneuropathy due to other toxic agents: Secondary | ICD-10-CM

## 2023-09-09 DIAGNOSIS — C538 Malignant neoplasm of overlapping sites of cervix uteri: Secondary | ICD-10-CM

## 2023-09-09 DIAGNOSIS — Z95828 Presence of other vascular implants and grafts: Secondary | ICD-10-CM

## 2023-09-09 MED ORDER — ONDANSETRON HCL 8 MG PO TABS: 8.0000 mg | ORAL_TABLET | Freq: Three times a day (TID) | ORAL | 1 refills | Status: DC | PRN

## 2023-09-09 MED ORDER — HYDROCODONE-ACETAMINOPHEN 5-325 MG PO TABS: 1.0000 | ORAL_TABLET | Freq: Four times a day (QID) | ORAL | 0 refills | Status: DC | PRN

## 2023-09-09 MED ORDER — PREGABALIN 200 MG PO CAPS: 200.0000 mg | ORAL_CAPSULE | Freq: Three times a day (TID) | ORAL | 0 refills | Status: DC

## 2023-09-16 NOTE — Progress Notes (Signed)
[facility] [address], [place] [address]    Clinic Day:  09/17/23     Referring physician: Billie Lade, MD  Patient Care Team: Billie Lade, MD as PCP - General (Internal Medicine) Doreatha Massed, MD as Medical Oncologist (Medical Oncology) Therese Sarah, RN as Oncology Nurse Navigator (Medical Oncology)   ASSESSMENT & PLAN:   Assessment: 1.  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 - CTAP (08/30/2022): Irregularity of the endometrium extending through the cervix with foci of gas in the cervix.  Multifocal omental and peritoneal metastasis.  Metastatic left periaortic, aortocaval, left external iliac and right common iliac lymph nodes.  Multiple bilateral lung nodules at bases.  12 mm hypodensity in the liver near the falciform ligament. - Endometrial biopsy (09/09/2022): Invasive moderately to poorly differentiated squamous cell carcinoma with abundant necrosis.  Cervix biopsy: Invasive and in situ moderate to poorly differentiated squamous cell carcinoma.  High risk HPV positive. - NGS: PD-L1 (22 C3): CPS 2, HER2 IHC negative.  PIK3CA exon 10 pathogenic variant.  MS-stable.  TMB-low.  FANCM and SDHA pathogenic variants. - PD-L1 CPS: 8% - Cycle 1 of cisplatin, paclitaxel, bevacizumab on 10/02/2022, pembrolizumab added with cycle 2 on 10/23/2022 - Bone scan (10/14/2022): No metastatic disease   2.  Social/family history: - She is married and lives at home with her husband and roommate.  She has never worked.  She is current active smoker, half pack per day, for the last 28 years.  She occasionally smokes marijuana. - Mother died of lung cancer.  Father had head and neck cancer.  Maternal grandmother had cancer.  Paternal uncle died of cancer.  Maternal uncle also had cancer.     Plan: 1.  Metastatic cervical cancer to the lungs, peritoneum and liver: - CT CAP on 05/08/2023 showed decrease in size of retroperitoneal nodes. - Her last treatment was on 08/06/2023.  She missed several appointments due to transportation problems. - She reported that she coughed up blood-streaked sputum x 1 last week.  Denies any bleeding per rectum or melena.  No recent nosebleeds noted. - She did not have any major GI problems with last cycle of chemotherapy. - Reviewed labs from 09/17/2023: Normal LFTs and creatinine.  CBC grossly normal. - She may proceed with her treatment today.  RTC 3 weeks for follow-up.  I plan to repeat CT scan after next cycle.   2.  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  Hemoglobin improved to 10.9.   3.  Peripheral neuropathy: - Continue gabapentin 600 mg 3 times daily.  Continue hydrocodone 5/325 as needed.   4.  Hypertension: - Continue amlodipine 10 mg daily.  Blood pressure today is 137/83.        Orders Placed This Encounter  Procedures   Genetic Screening Order    Standing Status:   Future    Expiration Date:   09/16/2024      Alben Deeds Teague,acting as a scribe for Doreatha Massed, MD.,have documented all relevant documentation on the behalf of Doreatha Massed, MD,as directed by  Doreatha Massed, MD while in the presence of Doreatha Massed, MD.  I, Doreatha Massed MD, have reviewed the above documentation for accuracy and completeness, and I agree with the above.       Doreatha Massed, MD  1/16/20255:09 PM  CHIEF COMPLAINT:   Diagnosis: metastatic cervical squamous cell carcinoma    Cancer Staging  Cervical cancer [facility]) Staging form: Cervix Uteri, AJCC Version 9 - Clinical stage from 09/19/2022: FIGO Stage IVB (cT4, cN2a, cM1) - Unsigned    Prior Therapy: none  Current Therapy:  Cisplatin + Paclitaxel + Bevacizumab, Keytruda   HISTORY OF PRESENT ILLNESS:   Oncology History Overview  Note  PD-L1 CPS 8%   Cervical cancer (HCC)  09/19/2022 Initial Diagnosis   Cervical cancer (HCC)   10/02/2022 -  Chemotherapy   [name]        INTERVAL HISTORY:   Kristin Carson is a 53 y.o. female presenting to clinic today for follow up of metastatic cervical squamous cell carcinoma. She was last seen by me on 08/06/23.  Today, she states that she is doing well overall. Her appetite level is at 90%. Her energy level is at 0%. She is accompanied by her husband.   She denies any side effects from treatment on 08/06/23. She has trouble getting transportation to [facility], and needs at least 3 days advance to get transportation. She reports one episode of hemoptysis yesterday. She denies any recent infection, hematuria, BRBPR, or epistaxis. She has her CT scheduled for 10/13/23.  She is taking all medications as prescribed.  She reports numbness in the feet. Her sinus issues from her last visit have resolved.   PAST MEDICAL HISTORY:   Past Medical History: Past Medical History:  Diagnosis Date   Anemia    Asthma    Bronchitis    Cancer (HCC)    Cervical   CHF (congestive heart failure) (HCC)    COPD (chronic obstructive pulmonary disease) (HCC)    Depression    GERD (gastroesophageal reflux disease)    Headache    Hypertension    Port-A-Cath in place 09/25/2022    Surgical History: Past Surgical History:  Procedure Laterality Date   BREAST SURGERY     CESAREAN SECTION     IR IMAGING GUIDED PORT INSERTION  09/30/2022   LEG SURGERY      Social History: Social History   Socioeconomic History   Marital status: Married    Spouse name: Not on file   Number of children: Not on file   Years of education: Not on file   Highest education level: Not on file  Occupational History   Not on file  Tobacco Use   Smoking status: Every Day    Current packs/day: 0.25    Average  packs/day: 0.3 packs/day for 23.0 years (5.8 ttl pk-yrs)    Types: Cigarettes   Smokeless tobacco: Never  Vaping Use   Vaping status: Never Used  Substance and Sexual Activity   Alcohol use: Yes    Comment: occ   Drug use: Yes    Types: Marijuana   Sexual activity: Yes    Birth control/protection: None  Other Topics Concern   Not on file  Social History Narrative   Not on file   Social Drivers of Health   Financial Resource Strain: Low Risk  (09/09/2022)   Overall Financial Resource Strain (CARDIA)    Difficulty of Paying Living Expenses: Not hard at all  Food Insecurity: No Food Insecurity (09/22/2022)   Hunger Vital Sign    Worried About Running Out of Food in the Last Year: Never true    Ran Out  of Food in the Last Year: Never true  Transportation Needs: No Transportation Needs (09/22/2022)   PRAPARE - Administrator, Civil Service (Medical): No    Lack of Transportation (Non-Medical): No  Recent Concern: Transportation Needs - Unmet Transportation Needs (09/09/2022)   PRAPARE - Transportation    Lack of Transportation (Medical): Yes    Lack of Transportation (Non-Medical): Yes  Physical Activity: Insufficiently Active (09/09/2022)   Exercise Vital Sign    Days of Exercise per Week: 2 days    Minutes of Exercise per Session: 20 min  Stress: No Stress Concern Present (09/09/2022)   [employer] of Occupational Health - Occupational Stress Questionnaire    Feeling of Stress : Only a little  Social Connections: Socially Isolated (09/09/2022)   Social Connection and Isolation Panel [NHANES]    Frequency of Communication with Friends and Family: Three times a week    Frequency of Social Gatherings with Friends and Family: More than three times a week    Attends Religious Services: Never    Database administrator or Organizations: No    Attends Banker Meetings: Never    Marital Status: Separated  Intimate Partner Violence: Not At Risk (09/22/2022)    Humiliation, Afraid, Rape, and Kick questionnaire    Fear of Current or Ex-Partner: No    Emotionally Abused: No    Physically Abused: No    Sexually Abused: No    Family History: Family History  Problem Relation Age of Onset   Dermatomyositis Mother    Hypertension Mother    Cancer Mother        lung   Cancer Father        lung   Heart disease Father    Lung cancer Paternal Uncle    Lung cancer Maternal Uncle    Colon cancer Neg Hx    Breast cancer Neg Hx    Ovarian cancer Neg Hx    Endometrial cancer Neg Hx    Pancreatic cancer Neg Hx    Prostate cancer Neg Hx     Current Medications:  Current Outpatient Medications:    albuterol (VENTOLIN HFA) 108 (90 Base) MCG/ACT inhaler, Inhale 2 puffs into the lungs every 6 (six) hours as needed for wheezing or shortness of breath., Disp: 8 g, Rfl: 2   amLODipine (NORVASC) 10 MG tablet, Take 1 tablet (10 mg total) by mouth daily., Disp: 30 tablet, Rfl: 0   amoxicillin-clavulanate (AUGMENTIN) 875-125 MG tablet, Take 1 tablet by mouth 2 (two) times daily., Disp: 14 tablet, Rfl: 0   Atezolizumab (TECENTRIQ IV), Inject into the vein every 21 ( twenty-one) days., Disp: , Rfl:    Bevacizumab (AVASTIN IV), Inject into the vein every 21 ( twenty-one) days., Disp: , Rfl:    cetirizine (ZYRTEC ALLERGY) 10 MG tablet, Take 1 tablet (10 mg total) by mouth daily., Disp: 5 tablet, Rfl: 0   HYDROcodone-acetaminophen (NORCO/VICODIN) 5-325 MG tablet, Take 1 tablet by mouth every 6 (six) hours as needed for moderate pain (pain score 4-6)., Disp: 120 tablet, Rfl: 0   lidocaine-prilocaine (EMLA) cream, Apply a dime-sized amount to port a cath site and cover with plastic wrap 1 hour prior to infusion appointments, Disp: 30 g, Rfl: 3   loperamide (IMODIUM) 2 MG capsule, Take 1 capsule (2 mg total) by mouth as needed for diarrhea or loose stools (Take 2 capsules after the first loose stool and 1 capsule after each loose stool thereafter.  DO NOT EXCEED  8  capsules a day.)., Disp: 30 capsule, Rfl: 0   megestrol (MEGACE) 400 MG/10ML suspension, Take 10 mLs (400 mg total) by mouth 2 (two) times daily., Disp: 480 mL, Rfl: 3   naproxen sodium (ALEVE) 220 MG tablet, Take 220 mg by mouth daily as needed (pain)., Disp: , Rfl:    nicotine polacrilex (NICORETTE) 4 MG gum, Take 1 each (4 mg total) by mouth as needed for smoking cessation., Disp: 100 tablet, Rfl: 0   ondansetron (ZOFRAN) 8 MG tablet, Take 1 tablet (8 mg total) by mouth every 8 (eight) hours as needed for nausea or vomiting. Start on the third day after cisplatin., Disp: 30 tablet, Rfl: 1   pantoprazole (PROTONIX) 40 MG tablet, Take 1 tablet (40 mg total) by mouth daily., Disp: 90 tablet, Rfl: 1   pregabalin (LYRICA) 200 MG capsule, Take 1 capsule (200 mg total) by mouth 3 (three) times daily., Disp: 180 capsule, Rfl: 0   prochlorperazine (COMPAZINE) 10 MG tablet, Take 1 tablet (10 mg total) by mouth every 6 (six) hours as needed (Nausea or vomiting)., Disp: 30 tablet, Rfl: 1   Allergies: Allergies  Allergen Reactions   Carrot [Daucus Carota] Hives   Lisinopril-Hydrochlorothiazide     Oral swelling   Ibuprofen Other (See Comments)    Oral swelling   Other Itching and Other (See Comments)    Hair dye, blisters, pus-filled, soreness   Tramadol Hives   Erythromycin Hives    REVIEW OF SYSTEMS:   Review of Systems  Constitutional:  Negative for chills, fatigue and fever.  HENT:   Negative for lump/mass, mouth sores, nosebleeds, sore throat and trouble swallowing.   Eyes:  Negative for eye problems.  Respiratory:  Negative for cough and shortness of breath.   Cardiovascular:  Negative for chest pain, leg swelling and palpitations.  Gastrointestinal:  Negative for abdominal pain, constipation, diarrhea, nausea and vomiting.  Genitourinary:  Negative for bladder incontinence, difficulty urinating, dysuria, frequency, hematuria and nocturia.   Musculoskeletal:  Negative for arthralgias,  back pain, flank pain, myalgias and neck pain.  Skin:  Negative for itching and rash.  Neurological:  Negative for dizziness, headaches and numbness.       +tingling feet  Hematological:  Does not bruise/bleed easily.  Psychiatric/Behavioral:  Positive for depression. Negative for sleep disturbance and suicidal ideas. The patient is not nervous/anxious.   All other systems reviewed and are negative.    VITALS:   Pulse (!) 103, weight 169 lb 8.5 oz (76.9 kg), last menstrual period 08/15/2022.  Wt Readings from Last 3 Encounters:  09/17/23 169 lb 8.5 oz (76.9 kg)  08/06/23 173 lb 6.4 oz (78.7 kg)  06/25/23 174 lb 14.4 oz (79.3 kg)    Body mass index is 29.56 kg/m.  Performance status (ECOG): 1 - Symptomatic but completely ambulatory  PHYSICAL EXAM:   Physical Exam Vitals and nursing note reviewed. Exam conducted with a chaperone present.  Constitutional:      Appearance: Normal appearance.  Cardiovascular:     Rate and Rhythm: Normal rate and regular rhythm.     Pulses: Normal pulses.     Heart sounds: Normal heart sounds.  Pulmonary:     Effort: Pulmonary effort is normal.     Breath sounds: Normal breath sounds.  Abdominal:     Palpations: Abdomen is soft. There is no hepatomegaly, splenomegaly or mass.     Tenderness: There is no abdominal tenderness.  Musculoskeletal:     Right lower leg: No  edema.     Left lower leg: No edema.  Lymphadenopathy:     Cervical: No cervical adenopathy.     Right cervical: No superficial, deep or posterior cervical adenopathy.    Left cervical: No superficial, deep or posterior cervical adenopathy.     Upper Body:     Right upper body: No supraclavicular or axillary adenopathy.     Left upper body: No supraclavicular or axillary adenopathy.  Neurological:     General: No focal deficit present.     Mental Status: She is alert and oriented to person, place, and time.  Psychiatric:        Mood and Affect: Mood normal.        Behavior:  Behavior normal.     LABS:      Latest Ref Rng & Units 09/17/2023    9:32 AM 08/06/2023    8:46 AM 06/25/2023    9:26 AM  CBC  WBC 4.0 - 10.5 K/uL 5.3  5.0  5.4   Hemoglobin 12.0 - 15.0 g/dL 86.5  9.6  8.4   Hematocrit 36.0 - 46.0 % 32.9  30.2  26.3   Platelets 150 - 400 K/uL 236  182  206       Latest Ref Rng & Units 09/17/2023    9:32 AM 08/06/2023    8:46 AM 06/25/2023    9:26 AM  CMP  Glucose 70 - 99 mg/dL 90  784  696   BUN 6 - 20 mg/dL 12  28  18    Creatinine 0.44 - 1.00 mg/dL 2.95  2.84  1.32   Sodium 135 - 145 mmol/L 133  135  135   Potassium 3.5 - 5.1 mmol/L 3.4  4.2  3.8   Chloride 98 - 111 mmol/L 97  104  105   CO2 22 - 32 mmol/L 23  23  24    Calcium 8.9 - 10.3 mg/dL 9.2  8.9  8.6   Total Protein 6.5 - 8.1 g/dL 8.2  7.5  7.1   Total Bilirubin 0.0 - 1.2 mg/dL 0.3  0.3  0.3   Alkaline Phos 38 - 126 U/L 54  56  36   AST 15 - 41 U/L 34  29  17   ALT 0 - 44 U/L 23  26  13       No results found for: "CEA1", "CEA" / No results found for: "CEA1", "CEA" No results found for: "PSA1" No results found for: "GMW102" No results found for: "CAN125"  No results found for: "TOTALPROTELP", "ALBUMINELP", "A1GS", "A2GS", "BETS", "BETA2SER", "GAMS", "MSPIKE", "SPEI" Lab Results  Component Value Date   TIBC 412 09/22/2022   FERRITIN 35 09/22/2022   IRONPCTSAT 7 (L) 09/22/2022   No results found for: "LDH"   STUDIES:   No results found.

## 2023-09-17 ENCOUNTER — Inpatient Hospital Stay: Payer: Medicaid Other

## 2023-09-17 ENCOUNTER — Inpatient Hospital Stay: Payer: Medicaid Other | Attending: Hematology | Admitting: Hematology

## 2023-09-17 VITALS — BP 131/86 | HR 96 | Temp 98.1°F | Resp 18

## 2023-09-17 VITALS — HR 103 | Wt 169.5 lb

## 2023-09-17 DIAGNOSIS — Z801 Family history of malignant neoplasm of trachea, bronchus and lung: Secondary | ICD-10-CM | POA: Diagnosis not present

## 2023-09-17 DIAGNOSIS — C538 Malignant neoplasm of overlapping sites of cervix uteri: Secondary | ICD-10-CM

## 2023-09-17 DIAGNOSIS — D649 Anemia, unspecified: Secondary | ICD-10-CM | POA: Insufficient documentation

## 2023-09-17 DIAGNOSIS — Z5111 Encounter for antineoplastic chemotherapy: Secondary | ICD-10-CM | POA: Insufficient documentation

## 2023-09-17 DIAGNOSIS — C7801 Secondary malignant neoplasm of right lung: Secondary | ICD-10-CM | POA: Diagnosis not present

## 2023-09-17 DIAGNOSIS — G629 Polyneuropathy, unspecified: Secondary | ICD-10-CM | POA: Insufficient documentation

## 2023-09-17 DIAGNOSIS — C539 Malignant neoplasm of cervix uteri, unspecified: Secondary | ICD-10-CM | POA: Insufficient documentation

## 2023-09-17 DIAGNOSIS — Z808 Family history of malignant neoplasm of other organs or systems: Secondary | ICD-10-CM | POA: Insufficient documentation

## 2023-09-17 DIAGNOSIS — I10 Essential (primary) hypertension: Secondary | ICD-10-CM | POA: Diagnosis not present

## 2023-09-17 DIAGNOSIS — C786 Secondary malignant neoplasm of retroperitoneum and peritoneum: Secondary | ICD-10-CM | POA: Insufficient documentation

## 2023-09-17 DIAGNOSIS — F17210 Nicotine dependence, cigarettes, uncomplicated: Secondary | ICD-10-CM | POA: Insufficient documentation

## 2023-09-17 DIAGNOSIS — C7802 Secondary malignant neoplasm of left lung: Secondary | ICD-10-CM | POA: Insufficient documentation

## 2023-09-17 DIAGNOSIS — Z7689 Persons encountering health services in other specified circumstances: Secondary | ICD-10-CM | POA: Diagnosis not present

## 2023-09-17 DIAGNOSIS — Z5112 Encounter for antineoplastic immunotherapy: Secondary | ICD-10-CM | POA: Insufficient documentation

## 2023-09-17 DIAGNOSIS — Z95828 Presence of other vascular implants and grafts: Secondary | ICD-10-CM

## 2023-09-17 DIAGNOSIS — C787 Secondary malignant neoplasm of liver and intrahepatic bile duct: Secondary | ICD-10-CM | POA: Insufficient documentation

## 2023-09-17 LAB — CBC WITH DIFFERENTIAL/PLATELET
Abs Immature Granulocytes: 0.03 10*3/uL (ref 0.00–0.07)
Basophils Absolute: 0 10*3/uL (ref 0.0–0.1)
Basophils Relative: 0 %
Eosinophils Absolute: 0.1 10*3/uL (ref 0.0–0.5)
Eosinophils Relative: 1 %
HCT: 32.9 % — ABNORMAL LOW (ref 36.0–46.0)
Hemoglobin: 10.9 g/dL — ABNORMAL LOW (ref 12.0–15.0)
Immature Granulocytes: 1 %
Lymphocytes Relative: 39 %
Lymphs Abs: 2.1 10*3/uL (ref 0.7–4.0)
MCH: 35.6 pg — ABNORMAL HIGH (ref 26.0–34.0)
MCHC: 33.1 g/dL (ref 30.0–36.0)
MCV: 107.5 fL — ABNORMAL HIGH (ref 80.0–100.0)
Monocytes Absolute: 0.8 10*3/uL (ref 0.1–1.0)
Monocytes Relative: 14 %
Neutro Abs: 2.4 10*3/uL (ref 1.7–7.7)
Neutrophils Relative %: 45 %
Platelets: 236 10*3/uL (ref 150–400)
RBC: 3.06 MIL/uL — ABNORMAL LOW (ref 3.87–5.11)
RDW: 14.6 % (ref 11.5–15.5)
WBC: 5.3 10*3/uL (ref 4.0–10.5)
nRBC: 0 % (ref 0.0–0.2)

## 2023-09-17 LAB — MAGNESIUM: Magnesium: 1.8 mg/dL (ref 1.7–2.4)

## 2023-09-17 LAB — COMPREHENSIVE METABOLIC PANEL
ALT: 23 U/L (ref 0–44)
AST: 34 U/L (ref 15–41)
Albumin: 3.4 g/dL — ABNORMAL LOW (ref 3.5–5.0)
Alkaline Phosphatase: 54 U/L (ref 38–126)
Anion gap: 13 (ref 5–15)
BUN: 12 mg/dL (ref 6–20)
CO2: 23 mmol/L (ref 22–32)
Calcium: 9.2 mg/dL (ref 8.9–10.3)
Chloride: 97 mmol/L — ABNORMAL LOW (ref 98–111)
Creatinine, Ser: 0.93 mg/dL (ref 0.44–1.00)
GFR, Estimated: >60 mL/min (ref >60)
Glucose, Bld: 90 mg/dL (ref 70–99)
Potassium: 3.4 mmol/L — ABNORMAL LOW (ref 3.5–5.1)
Sodium: 133 mmol/L — ABNORMAL LOW (ref 135–145)
Total Bilirubin: 0.3 mg/dL (ref 0.0–1.2)
Total Protein: 8.2 g/dL — ABNORMAL HIGH (ref 6.5–8.1)

## 2023-09-17 MED ORDER — POTASSIUM CHLORIDE CRYS ER 20 MEQ PO TBCR
40.0000 meq | EXTENDED_RELEASE_TABLET | Freq: Once | ORAL | Status: AC
  Administered 2023-09-17: 40 meq via ORAL
  Filled 2023-09-17: qty 2

## 2023-09-17 MED ORDER — CETIRIZINE HCL 10 MG/ML IV SOLN
10.0000 mg | Freq: Once | INTRAVENOUS | Status: AC
  Administered 2023-09-17: 10 mg via INTRAVENOUS
  Filled 2023-09-17: qty 1

## 2023-09-17 MED ORDER — DEXAMETHASONE SODIUM PHOSPHATE 10 MG/ML IJ SOLN
10.0000 mg | Freq: Once | INTRAMUSCULAR | Status: AC
  Administered 2023-09-17: 10 mg via INTRAVENOUS
  Filled 2023-09-17: qty 1

## 2023-09-17 MED ORDER — SODIUM CHLORIDE 0.9 % IV SOLN
14.0000 mg/kg | Freq: Once | INTRAVENOUS | Status: AC
  Administered 2023-09-17: 1200 mg via INTRAVENOUS
  Filled 2023-09-17: qty 48

## 2023-09-17 MED ORDER — HYDROCODONE-ACETAMINOPHEN 5-325 MG PO TABS
1.0000 | ORAL_TABLET | Freq: Once | ORAL | Status: AC
Start: 2023-09-17 — End: 2023-09-17
  Administered 2023-09-17: 1 via ORAL
  Filled 2023-09-17: qty 1

## 2023-09-17 MED ORDER — SODIUM CHLORIDE 0.9% FLUSH
10.0000 mL | Freq: Once | INTRAVENOUS | Status: AC
  Administered 2023-09-17: 10 mL via INTRAVENOUS

## 2023-09-17 MED ORDER — PALONOSETRON HCL INJECTION 0.25 MG/5ML
0.2500 mg | Freq: Once | INTRAVENOUS | Status: AC
  Administered 2023-09-17: 0.25 mg via INTRAVENOUS
  Filled 2023-09-17: qty 5

## 2023-09-17 MED ORDER — PEMBROLIZUMAB CHEMO INJECTION 100 MG/4ML
200.0000 mg | Freq: Once | INTRAVENOUS | Status: AC
  Administered 2023-09-17: 200 mg via INTRAVENOUS
  Filled 2023-09-17: qty 8

## 2023-09-17 MED ORDER — HEPARIN SOD (PORK) LOCK FLUSH 100 UNIT/ML IV SOLN
500.0000 [IU] | Freq: Once | INTRAVENOUS | Status: AC | PRN
  Administered 2023-09-17: 500 [IU]

## 2023-09-17 MED ORDER — FOSAPREPITANT DIMEGLUMINE INJECTION 150 MG
150.0000 mg | Freq: Once | INTRAVENOUS | Status: AC
  Administered 2023-09-17: 150 mg via INTRAVENOUS
  Filled 2023-09-17: qty 150

## 2023-09-17 MED ORDER — FAMOTIDINE IN NACL 20-0.9 MG/50ML-% IV SOLN
20.0000 mg | Freq: Once | INTRAVENOUS | Status: AC
  Administered 2023-09-17: 20 mg via INTRAVENOUS
  Filled 2023-09-17: qty 50

## 2023-09-17 MED ORDER — SODIUM CHLORIDE 0.9 % IV SOLN: Freq: Once | INTRAVENOUS | Status: AC

## 2023-09-17 MED ORDER — SODIUM CHLORIDE 0.9 % IV SOLN
481.6000 mg | Freq: Once | INTRAVENOUS | Status: AC
  Administered 2023-09-17: 480 mg via INTRAVENOUS
  Filled 2023-09-17: qty 48

## 2023-09-17 NOTE — Progress Notes (Signed)
New Vegzelma dose with weight change:  Vegzelma 15 mg/kg with weight 76.9 kg = 1200 mg  V.O. Dr Carilyn Goodpasture, PharmD

## 2023-09-17 NOTE — Progress Notes (Signed)
Patient has been examined by Dr. Katragadda. Vital signs and labs have been reviewed by MD - ANC, Creatinine, LFTs, hemoglobin, and platelets are within treatment parameters per M.D. - pt may proceed with treatment.  Primary RN and pharmacy notified.  

## 2023-09-17 NOTE — Patient Instructions (Signed)

## 2023-09-17 NOTE — Patient Instructions (Signed)
[facility] - A DEPT OF [facility]  Discharge Instructions: Thank you for choosing [facility] to provide your oncology and hematology care.  If you have a lab appointment with the Cancer Center - please note that after April 8th, 2024, all labs will be drawn in the cancer center.  You do not have to check in or register with the main entrance as you have in the past but will complete your check-in in the cancer center.  Wear comfortable clothing and clothing appropriate for easy access to any Portacath or PICC line.   We strive to give you quality time with your provider. You may need to reschedule your appointment if you arrive late (15 or more minutes).  Arriving late affects you and other patients whose appointments are after yours.  Also, if you miss three or more appointments without notifying the office, you may be dismissed from the clinic at the provider's discretion.      For prescription refill requests, have your pharmacy contact our office and allow 72 hours for refills to be completed.    Today you received the following chemotherapy and/or immunotherapy agents vegzelma, keytruda, carboplatin   To help prevent nausea and vomiting after your treatment, we encourage you to take your nausea medication as directed.  BELOW ARE SYMPTOMS THAT SHOULD BE REPORTED IMMEDIATELY: *FEVER GREATER THAN 100.4 F (38 C) OR HIGHER *CHILLS OR SWEATING *NAUSEA AND VOMITING THAT IS NOT CONTROLLED WITH YOUR NAUSEA MEDICATION *UNUSUAL SHORTNESS OF BREATH *UNUSUAL BRUISING OR BLEEDING *URINARY PROBLEMS (pain or burning when urinating, or frequent urination) *BOWEL PROBLEMS (unusual diarrhea, constipation, pain near the anus) TENDERNESS IN MOUTH AND THROAT WITH OR WITHOUT PRESENCE OF ULCERS (sore throat, sores in mouth, or a toothache) UNUSUAL RASH, SWELLING OR PAIN  UNUSUAL VAGINAL DISCHARGE OR ITCHING   Items with * indicate a potential emergency and  should be followed up as soon as possible or go to the Emergency Department if any problems should occur.  Please show the CHEMOTHERAPY ALERT CARD or IMMUNOTHERAPY ALERT CARD at check-in to the Emergency Department and triage nurse.  Should you have questions after your visit or need to cancel or reschedule your appointment, please contact [facility] [place] - A DEPT OF [facility] [phone]  and follow the prompts.  Office hours are 8:00 a.m. to 4:30 p.m. Monday - Friday. Please note that voicemails left after 4:00 p.m. may not be returned until the following business day.  We are closed weekends and major holidays. You have access to a nurse at all times for urgent questions. Please call the main number to the clinic [phone] and follow the prompts.  For any non-urgent questions, you may also contact your provider using MyChart. We now offer e-Visits for anyone 85 and older to request care online for non-urgent symptoms. For details visit [email or web address].   Also download the MyChart app! Go to the app store, search "MyChart", open the app, select [facility], and log in with your MyChart username and password.

## 2023-09-17 NOTE — Progress Notes (Signed)
Per pt she having pain in both of her feet and asking for 1 Noco 5-325mg  pill. MD made aware. Per MD to proceed with 1 Noco 5-325 mg PO at this time. Orders placed.   Patient tolerated chemotherapy with no complaints voiced.  Side effects with management reviewed with understanding verbalized.  Port site clean and dry with no bruising or swelling noted at site.  Good blood return noted before and after administration of chemotherapy.  Band aid applied.  Patient left in satisfactory condition with VSS and no s/s of distress noted.

## 2023-10-03 DIAGNOSIS — Z419 Encounter for procedure for purposes other than remedying health state, unspecified: Secondary | ICD-10-CM | POA: Diagnosis not present

## 2023-10-07 NOTE — Progress Notes (Signed)
 [facility] [address] [place] [address]    Clinic Day:  10/08/23     Referring physician: Melvenia Manus BRAVO, MD  Patient Care Team: Kristin Manus BRAVO, MD as PCP - General (Internal Medicine) Kristin Hai, MD as Medical Oncologist (Medical Oncology) Kristin Joesph SQUIBB, RN as Oncology Nurse Navigator (Medical Oncology)   ASSESSMENT & PLAN:   Assessment: 1.  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 - CTAP (08/30/2022): Irregularity of the endometrium extending through the cervix with foci of gas in the cervix.  Multifocal omental and peritoneal metastasis.  Metastatic left periaortic, aortocaval, left external iliac and right common iliac lymph nodes.  Multiple bilateral lung nodules at bases.  12 mm hypodensity in the liver near the falciform ligament. - Endometrial biopsy (09/09/2022): Invasive moderately to poorly differentiated squamous cell carcinoma with abundant necrosis.  Cervix biopsy: Invasive and in situ moderate to poorly differentiated squamous cell carcinoma.  High risk HPV positive. - NGS: PD-L1 (22 C3): CPS 2, HER2 IHC negative.  PIK3CA exon 10 pathogenic variant.  MS-stable.  TMB-low.  FANCM and SDHA pathogenic variants. - PD-L1 CPS: 8% - Cycle 1 of cisplatin , paclitaxel , bevacizumab  on 10/02/2022, pembrolizumab  added with cycle 2 on 10/23/2022 - Bone scan (10/14/2022): No metastatic disease   2.  Social/family history: - She is married and lives at home with her husband and roommate.  She has never worked.  She is current active smoker, half pack per day, for the last 28 years.  She occasionally smokes marijuana. - Mother died of lung cancer.  Father had head and neck cancer.  Maternal grandmother had cancer.  Paternal uncle died of cancer.  Maternal uncle also had cancer.     Plan: 1.  Metastatic cervical cancer to the lungs, peritoneum and liver: - CT CAP on 05/08/2023 showed decrease in size of retroperitoneal lymph nodes. - She reported severe itching on the back.  No rash seen.  Scratch marks present. - She reported watery stools for the last 2 days, 3-4 times per day.  She also lost 5 pounds. - Reviewed labs today: Normal LFTs.  CBC grossly normal with platelet count normal. - Will hold her treatment today.  She was told to call us  if her diarrhea gets worse.  We will start empiric steroids. - We will treat her early next week if diarrhea resolves and itching improves. - She will have repeat imaging on 10/19/2023.  I will see her back in 3 weeks for follow-up.   2.  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  Hemoglobin improved to 11.2.   3.  Peripheral neuropathy: - Continue gabapentin  600 mg 3 times daily.  Continue hydrocodone  5/325 as needed.   4.  Hypertension: - Continue amlodipine  10 mg daily.  Blood pressure today is 139/80.        No orders of the defined types were placed in this encounter.     Kristin Carson,acting as a neurosurgeon for Carson Rogers, MD.,have documented all relevant documentation on the behalf of Carson Rogers, MD,as directed by  Carson Rogers, MD while in the presence of Carson Rogers, MD.  I, Carson Rogers MD, have reviewed the above documentation for accuracy and completeness, and I agree with the above.       Carson Rogers, MD   2/6/20255:14 PM  CHIEF COMPLAINT:   Diagnosis: metastatic cervical squamous cell carcinoma    Cancer Staging  Cervical cancer [facility]) Staging form: Cervix Uteri, AJCC Version 9 - Clinical stage from 09/19/2022: FIGO Stage IVB (cT4, cN2a, cM1) - Unsigned    Prior Therapy: none  Current Therapy:  Cisplatin  + Paclitaxel  + Bevacizumab , Keytruda    HISTORY OF PRESENT ILLNESS:   Oncology History Overview Note  PD-L1 CPS 8%   Cervical  cancer (HCC)  09/19/2022 Initial Diagnosis   Cervical cancer (HCC)   10/02/2022 -  Chemotherapy   Patient is on Treatment Plan : CERVICAL Cisplatin  50 mg/m2 + PAClitaxel  135 mg/m2 + Bevacizumab  15 mg/kg + Keytruda  q21d        INTERVAL HISTORY:   Kristin Carson is a 53 y.o. female presenting to clinic today for follow up of metastatic cervical squamous cell carcinoma. She was last seen by me on 09/17/23.  Today, she states that she is doing well overall. Her appetite level is at 75%. Her energy level is at 60%. She is accompanied by a friend.  She reports occasional palpitations. Kristin Carson notes itchiness on the back that appeared after her last treatment on 09/17/23. She treats itchiness with oral Benadryl  and Vaseline cream.   She reports diarrhea with watery stools 3-4 times a day for the last 2 days. There is associated incontinence at night. She had 1 BM today with watery stools. She denies any BRBPR, hematochezia, epistaxis, or other bleeding issues. She notes decreased appetite and taste for the last 2 days. Kristin Carson is eating the equivalent of one meal per day in solid foods, which may be contributing to diarrhea. She has lost 1 pound since her last treatment.  PAST MEDICAL HISTORY:   Past Medical History: Past Medical History:  Diagnosis Date   Anemia    Asthma    Bronchitis    Cancer (HCC)    Cervical   CHF (congestive heart failure) (HCC)    COPD (chronic obstructive pulmonary disease) (HCC)    Depression    GERD (gastroesophageal reflux disease)    Headache    Hypertension    Port-A-Cath in place 09/25/2022    Surgical History: Past Surgical History:  Procedure Laterality Date   BREAST SURGERY     CESAREAN SECTION     IR IMAGING GUIDED PORT INSERTION  09/30/2022   LEG SURGERY      Social History: Social History   Socioeconomic History   Marital status: Married    Spouse name: Not on file   Number of children: Not on file   Years of education: Not on file   Highest  education level: Not on file  Occupational History   Not on file  Tobacco Use   Smoking status: Every Day    Current packs/day: 0.25    Average packs/day: 0.3 packs/day for 23.0 years (5.8 ttl pk-yrs)    Types: Cigarettes   Smokeless tobacco: Never  Vaping Use   Vaping status: Never Used  Substance and Sexual Activity   Alcohol  use: Yes    Comment: occ   Drug use: Yes    Types: Marijuana   Sexual activity: Yes    Birth control/protection: None  Other Topics Concern   Not on file  Social History Narrative   Not on file   Social Drivers of Health   Financial Resource Strain: Low Risk  (09/09/2022)   Overall Financial Resource Strain (CARDIA)    Difficulty of Paying Living Expenses: Not hard at all  Food Insecurity:  No Food Insecurity (09/22/2022)   Hunger Vital Sign    Worried About Running Out of Food in the Last Year: Never true    Ran Out of Food in the Last Year: Never true  Transportation Needs: No Transportation Needs (09/22/2022)   PRAPARE - Administrator, Civil Service (Medical): No    Lack of Transportation (Non-Medical): No  Recent Concern: Transportation Needs - Unmet Transportation Needs (09/09/2022)   PRAPARE - Transportation    Lack of Transportation (Medical): Yes    Lack of Transportation (Non-Medical): Yes  Physical Activity: Insufficiently Active (09/09/2022)   Exercise Vital Sign    Days of Exercise per Week: 2 days    Minutes of Exercise per Session: 20 min  Stress: No Stress Concern Present (09/09/2022)   Kristin Carson of Occupational Health - Occupational Stress Questionnaire    Feeling of Stress : Only a little  Social Connections: Socially Isolated (09/09/2022)   Social Connection and Isolation Panel [NHANES]    Frequency of Communication with Friends and Family: Three times a week    Frequency of Social Gatherings with Friends and Family: More than three times a week    Attends Religious Services: Never    Database Administrator or  Organizations: No    Attends Banker Meetings: Never    Marital Status: Separated  Intimate Partner Violence: Not At Risk (09/22/2022)   Humiliation, Afraid, Rape, and Kick questionnaire    Fear of Current or Ex-Partner: No    Emotionally Abused: No    Physically Abused: No    Sexually Abused: No    Family History: Family History  Problem Relation Age of Onset   Dermatomyositis Mother    Hypertension Mother    Cancer Mother        lung   Cancer Father        lung   Heart disease Father    Lung cancer Paternal Uncle    Lung cancer Maternal Uncle    Colon cancer Neg Hx    Breast cancer Neg Hx    Ovarian cancer Neg Hx    Endometrial cancer Neg Hx    Pancreatic cancer Neg Hx    Prostate cancer Neg Hx     Current Medications:  Current Outpatient Medications:    albuterol  (VENTOLIN  HFA) 108 (90 Base) MCG/ACT inhaler, Inhale 2 puffs into the lungs every 6 (six) hours as needed for wheezing or shortness of breath., Disp: 8 g, Rfl: 2   amLODipine  (NORVASC ) 10 MG tablet, Take 1 tablet (10 mg total) by mouth daily., Disp: 30 tablet, Rfl: 0   amoxicillin -clavulanate (AUGMENTIN ) 875-125 MG tablet, Take 1 tablet by mouth 2 (two) times daily., Disp: 14 tablet, Rfl: 0   Bevacizumab  (AVASTIN  IV), Inject into the vein every 21 ( twenty-one) days., Disp: , Rfl:    cetirizine  (ZYRTEC  ALLERGY) 10 MG tablet, Take 1 tablet (10 mg total) by mouth daily., Disp: 5 tablet, Rfl: 0   HYDROcodone -acetaminophen  (NORCO/VICODIN) 5-325 MG tablet, Take 1 tablet by mouth every 6 (six) hours as needed for moderate pain (pain score 4-6)., Disp: 120 tablet, Rfl: 0   lidocaine -prilocaine  (EMLA ) cream, Apply a dime-sized amount to port a cath site and cover with plastic wrap 1 hour prior to infusion appointments, Disp: 30 g, Rfl: 3   loperamide  (IMODIUM ) 2 MG capsule, Take 1 capsule (2 mg total) by mouth as needed for diarrhea or loose stools (Take 2 capsules after the first loose stool and  1 capsule  after each loose stool thereafter.  DO NOT EXCEED 8 capsules a day.)., Disp: 30 capsule, Rfl: 0   megestrol  (MEGACE ) 400 MG/10ML suspension, Take 10 mLs (400 mg total) by mouth 2 (two) times daily., Disp: 480 mL, Rfl: 3   naproxen  sodium (ALEVE ) 220 MG tablet, Take 220 mg by mouth daily as needed (pain)., Disp: , Rfl:    nicotine  polacrilex (NICORETTE ) 4 MG gum, Take 1 each (4 mg total) by mouth as needed for smoking cessation., Disp: 100 tablet, Rfl: 0   ondansetron  (ZOFRAN ) 8 MG tablet, Take 1 tablet (8 mg total) by mouth every 8 (eight) hours as needed for nausea or vomiting. Start on the third day after cisplatin ., Disp: 30 tablet, Rfl: 1   pantoprazole  (PROTONIX ) 40 MG tablet, Take 1 tablet (40 mg total) by mouth daily., Disp: 90 tablet, Rfl: 1   pregabalin  (LYRICA ) 200 MG capsule, Take 1 capsule (200 mg total) by mouth 3 (three) times daily., Disp: 180 capsule, Rfl: 0   prochlorperazine  (COMPAZINE ) 10 MG tablet, Take 1 tablet (10 mg total) by mouth every 6 (six) hours as needed (Nausea or vomiting)., Disp: 30 tablet, Rfl: 1   Atezolizumab (TECENTRIQ IV), Inject into the vein every 21 ( twenty-one) days. (Patient not taking: Reported on 10/08/2023), Disp: , Rfl:    Allergies: Allergies  Allergen Reactions   Carrot [Daucus Carota] Hives   Lisinopril -Hydrochlorothiazide      Oral swelling   Ibuprofen  Other (See Comments)    Oral swelling   Other Itching and Other (See Comments)    Hair dye, blisters, pus-filled, soreness   Tramadol  Hives   Erythromycin Hives    REVIEW OF SYSTEMS:   Review of Systems  Constitutional:  Positive for fatigue. Negative for chills and fever.  HENT:   Negative for lump/mass, mouth sores, nosebleeds, sore throat and trouble swallowing.   Eyes:  Negative for eye problems.  Respiratory:  Positive for cough. Negative for shortness of breath.   Cardiovascular:  Positive for palpitations. Negative for chest pain and leg swelling.  Gastrointestinal:  Positive for  diarrhea. Negative for abdominal pain, constipation, nausea and vomiting.  Genitourinary:  Negative for bladder incontinence, difficulty urinating, dysuria, frequency, hematuria and nocturia.   Musculoskeletal:  Negative for arthralgias, back pain, flank pain, myalgias and neck pain.  Skin:  Positive for itching (on back). Negative for rash.  Neurological:  Positive for numbness (and burning in feet, 9/10 severity). Negative for dizziness and headaches.  Hematological:  Does not bruise/bleed easily.  Psychiatric/Behavioral:  Negative for depression, sleep disturbance and suicidal ideas. The patient is not nervous/anxious.   All other systems reviewed and are negative.    VITALS:   Blood pressure 139/87, pulse (!) 111, temperature (!) 97.1 F (36.2 C), temperature source Tympanic, resp. rate 18, weight 164 lb 14.5 oz (74.8 kg), last menstrual period 08/15/2022, SpO2 96%.  Wt Readings from Last 3 Encounters:  10/08/23 164 lb 14.5 oz (74.8 kg)  09/17/23 169 lb 8.5 oz (76.9 kg)  08/06/23 173 lb 6.4 oz (78.7 kg)    Body mass index is 28.75 kg/m.  Performance status (ECOG): 1 - Symptomatic but completely ambulatory  PHYSICAL EXAM:   Physical Exam Vitals and nursing note reviewed. Exam conducted with a chaperone present.  Constitutional:      Appearance: Normal appearance.  Cardiovascular:     Rate and Rhythm: Normal rate and regular rhythm.     Pulses: Normal pulses.     Heart sounds:  Normal heart sounds.  Pulmonary:     Effort: Pulmonary effort is normal.     Breath sounds: Normal breath sounds.  Abdominal:     Palpations: Abdomen is soft. There is no hepatomegaly, splenomegaly or mass.     Tenderness: There is no abdominal tenderness.  Musculoskeletal:     Right lower leg: No edema.     Left lower leg: No edema.  Lymphadenopathy:     Cervical: No cervical adenopathy.     Right cervical: No superficial, deep or posterior cervical adenopathy.    Left cervical: No superficial,  deep or posterior cervical adenopathy.     Upper Body:     Right upper body: No supraclavicular or axillary adenopathy.     Left upper body: No supraclavicular or axillary adenopathy.  Neurological:     General: No focal deficit present.     Mental Status: She is alert and oriented to person, place, and time.  Psychiatric:        Mood and Affect: Mood normal.        Behavior: Behavior normal.     LABS:      Latest Ref Rng & Units 10/08/2023    9:14 AM 09/17/2023    9:32 AM 08/06/2023    8:46 AM  CBC  WBC 4.0 - 10.5 K/uL 4.4  5.3  5.0   Hemoglobin 12.0 - 15.0 g/dL 88.7  89.0  9.6   Hematocrit 36.0 - 46.0 % 34.1  32.9  30.2   Platelets 150 - 400 K/uL 105  236  182       Latest Ref Rng & Units 10/08/2023    9:14 AM 09/17/2023    9:32 AM 08/06/2023    8:46 AM  CMP  Glucose 70 - 99 mg/dL 86  90  898   BUN 6 - 20 mg/dL 14  12  28    Creatinine 0.44 - 1.00 mg/dL 8.82  9.06  8.91   Sodium 135 - 145 mmol/L 132  133  135   Potassium 3.5 - 5.1 mmol/L 3.5  3.4  4.2   Chloride 98 - 111 mmol/L 96  97  104   CO2 22 - 32 mmol/L 23  23  23    Calcium  8.9 - 10.3 mg/dL 9.2  9.2  8.9   Total Protein 6.5 - 8.1 g/dL 7.7  8.2  7.5   Total Bilirubin 0.0 - 1.2 mg/dL 0.8  0.3  0.3   Alkaline Phos 38 - 126 U/L 58  54  56   AST 15 - 41 U/L 29  34  29   ALT 0 - 44 U/L 19  23  26       No results found for: CEA1, CEA / No results found for: CEA1, CEA No results found for: PSA1 No results found for: CAN199 No results found for: CAN125  No results found for: TOTALPROTELP, ALBUMINELP, A1GS, A2GS, BETS, BETA2SER, GAMS, MSPIKE, SPEI Lab Results  Component Value Date   TIBC 412 09/22/2022   FERRITIN 35 09/22/2022   IRONPCTSAT 7 (L) 09/22/2022   No results found for: LDH   STUDIES:   No results found.

## 2023-10-08 ENCOUNTER — Inpatient Hospital Stay (HOSPITAL_BASED_OUTPATIENT_CLINIC_OR_DEPARTMENT_OTHER): Payer: Medicaid Other | Admitting: Hematology

## 2023-10-08 ENCOUNTER — Inpatient Hospital Stay: Payer: Medicaid Other

## 2023-10-08 ENCOUNTER — Inpatient Hospital Stay: Payer: Medicaid Other | Attending: Hematology

## 2023-10-08 ENCOUNTER — Encounter: Payer: Self-pay | Admitting: Hematology

## 2023-10-08 VITALS — BP 139/87 | HR 111 | Temp 97.1°F | Resp 18 | Wt 164.9 lb

## 2023-10-08 DIAGNOSIS — Z801 Family history of malignant neoplasm of trachea, bronchus and lung: Secondary | ICD-10-CM | POA: Diagnosis not present

## 2023-10-08 DIAGNOSIS — Z808 Family history of malignant neoplasm of other organs or systems: Secondary | ICD-10-CM | POA: Insufficient documentation

## 2023-10-08 DIAGNOSIS — C539 Malignant neoplasm of cervix uteri, unspecified: Secondary | ICD-10-CM | POA: Insufficient documentation

## 2023-10-08 DIAGNOSIS — I10 Essential (primary) hypertension: Secondary | ICD-10-CM | POA: Diagnosis not present

## 2023-10-08 DIAGNOSIS — C538 Malignant neoplasm of overlapping sites of cervix uteri: Secondary | ICD-10-CM

## 2023-10-08 DIAGNOSIS — G629 Polyneuropathy, unspecified: Secondary | ICD-10-CM | POA: Insufficient documentation

## 2023-10-08 DIAGNOSIS — C787 Secondary malignant neoplasm of liver and intrahepatic bile duct: Secondary | ICD-10-CM | POA: Diagnosis not present

## 2023-10-08 DIAGNOSIS — F17210 Nicotine dependence, cigarettes, uncomplicated: Secondary | ICD-10-CM | POA: Diagnosis not present

## 2023-10-08 DIAGNOSIS — D649 Anemia, unspecified: Secondary | ICD-10-CM | POA: Diagnosis not present

## 2023-10-08 DIAGNOSIS — C7802 Secondary malignant neoplasm of left lung: Secondary | ICD-10-CM | POA: Diagnosis not present

## 2023-10-08 DIAGNOSIS — C7801 Secondary malignant neoplasm of right lung: Secondary | ICD-10-CM | POA: Diagnosis not present

## 2023-10-08 DIAGNOSIS — C786 Secondary malignant neoplasm of retroperitoneum and peritoneum: Secondary | ICD-10-CM | POA: Insufficient documentation

## 2023-10-08 DIAGNOSIS — Z95828 Presence of other vascular implants and grafts: Secondary | ICD-10-CM

## 2023-10-08 LAB — COMPREHENSIVE METABOLIC PANEL
ALT: 19 U/L (ref 0–44)
AST: 29 U/L (ref 15–41)
Albumin: 3.5 g/dL (ref 3.5–5.0)
Alkaline Phosphatase: 58 U/L (ref 38–126)
Anion gap: 13 (ref 5–15)
BUN: 14 mg/dL (ref 6–20)
CO2: 23 mmol/L (ref 22–32)
Calcium: 9.2 mg/dL (ref 8.9–10.3)
Chloride: 96 mmol/L — ABNORMAL LOW (ref 98–111)
Creatinine, Ser: 1.17 mg/dL — ABNORMAL HIGH (ref 0.44–1.00)
GFR, Estimated: 57 mL/min — ABNORMAL LOW (ref >60)
Glucose, Bld: 86 mg/dL (ref 70–99)
Potassium: 3.5 mmol/L (ref 3.5–5.1)
Sodium: 132 mmol/L — ABNORMAL LOW (ref 135–145)
Total Bilirubin: 0.8 mg/dL (ref 0.0–1.2)
Total Protein: 7.7 g/dL (ref 6.5–8.1)

## 2023-10-08 LAB — CBC WITH DIFFERENTIAL/PLATELET
Abs Immature Granulocytes: 0.01 10*3/uL (ref 0.00–0.07)
Basophils Absolute: 0 10*3/uL (ref 0.0–0.1)
Basophils Relative: 0 %
Eosinophils Absolute: 0.1 10*3/uL (ref 0.0–0.5)
Eosinophils Relative: 3 %
HCT: 34.1 % — ABNORMAL LOW (ref 36.0–46.0)
Hemoglobin: 11.2 g/dL — ABNORMAL LOW (ref 12.0–15.0)
Immature Granulocytes: 0 %
Lymphocytes Relative: 38 %
Lymphs Abs: 1.7 10*3/uL (ref 0.7–4.0)
MCH: 34.5 pg — ABNORMAL HIGH (ref 26.0–34.0)
MCHC: 32.8 g/dL (ref 30.0–36.0)
MCV: 104.9 fL — ABNORMAL HIGH (ref 80.0–100.0)
Monocytes Absolute: 0.6 10*3/uL (ref 0.1–1.0)
Monocytes Relative: 14 %
Neutro Abs: 2 10*3/uL (ref 1.7–7.7)
Neutrophils Relative %: 45 %
Platelets: 105 10*3/uL — ABNORMAL LOW (ref 150–400)
RBC: 3.25 MIL/uL — ABNORMAL LOW (ref 3.87–5.11)
RDW: 14.9 % (ref 11.5–15.5)
WBC: 4.4 10*3/uL (ref 4.0–10.5)
nRBC: 0 % (ref 0.0–0.2)

## 2023-10-08 LAB — MAGNESIUM: Magnesium: 1.7 mg/dL (ref 1.7–2.4)

## 2023-10-08 MED ORDER — SODIUM CHLORIDE 0.9% FLUSH
10.0000 mL | Freq: Once | INTRAVENOUS | Status: AC
  Administered 2023-10-08: 10 mL

## 2023-10-08 MED ORDER — HEPARIN SOD (PORK) LOCK FLUSH 100 UNIT/ML IV SOLN
500.0000 [IU] | Freq: Once | INTRAVENOUS | Status: AC
  Administered 2023-10-08: 500 [IU] via INTRAVENOUS

## 2023-10-08 NOTE — Progress Notes (Deleted)
Patient has been examined by Dr. Ellin Saba. Vital signs (HR 111) and labs have been reviewed by MD - ANC, Creatinine, LFTs, hemoglobin, and platelets are within treatment parameters per M.D. - pt may proceed with treatment.  Primary RN and pharmacy notified.

## 2023-10-08 NOTE — Progress Notes (Signed)
 Message received from A.Anderson RN / Dr. Cheree Cords treatment HELD today due to diarrhea unresolved. Deferred treatment until  next week per A.Alva Jewels RN / Dr. Cheree Cords.

## 2023-10-08 NOTE — Patient Instructions (Signed)
 [facility] at [facility] Discharge Instructions   You were seen and examined today by Dr. Rogers.  He reviewed the results of your lab work which are normal/stable.   We will hold your treatment today. We will plan for treatment next week.   Return as scheduled.    Thank you for choosing [facility] at [facility] to provide your oncology and hematology care.  To afford each patient quality time with our provider, please arrive at least 15 minutes before your scheduled appointment time.   If you have a lab appointment with the Cancer Center please come in thru the Main Entrance and check in at the main information desk.  You need to re-schedule your appointment should you arrive 10 or more minutes late.  We strive to give you quality time with our providers, and arriving late affects you and other patients whose appointments are after yours.  Also, if you no show three or more times for appointments you may be dismissed from the clinic at the providers discretion.     Again, thank you for choosing [facility].  Our hope is that these requests will decrease the amount of time that you wait before being seen by our physicians.       _____________________________________________________________  Should you have questions after your visit to [facility], please contact our office at [phone] and follow the prompts.  Our office hours are 8:00 a.m. and 4:30 p.m. Monday - Friday.  Please note that voicemails left after 4:00 p.m. may not be returned until the following business day.  We are closed weekends and major holidays.  You do have access to a nurse 24-7, just call the main number to the clinic [phone] and do not press any options, hold on the line and a nurse will answer the phone.    For prescription refill requests, have your pharmacy contact our office and allow 72 hours.    Due to Covid, you will need  to wear a mask upon entering the hospital. If you do not have a mask, a mask will be given to you at the Main Entrance upon arrival. For doctor visits, patients may have 1 support person age 34 or older with them. For treatment visits, patients can not have anyone with them due to social distancing guidelines and our immunocompromised population.

## 2023-10-09 ENCOUNTER — Other Ambulatory Visit: Payer: Self-pay

## 2023-10-09 DIAGNOSIS — G622 Polyneuropathy due to other toxic agents: Secondary | ICD-10-CM

## 2023-10-09 DIAGNOSIS — G893 Neoplasm related pain (acute) (chronic): Secondary | ICD-10-CM

## 2023-10-09 MED ORDER — HYDROCODONE-ACETAMINOPHEN 5-325 MG PO TABS: 1.0000 | ORAL_TABLET | Freq: Four times a day (QID) | ORAL | 0 refills | Status: DC | PRN

## 2023-10-09 MED ORDER — PREGABALIN 200 MG PO CAPS: 200.0000 mg | ORAL_CAPSULE | Freq: Three times a day (TID) | ORAL | 0 refills | Status: DC

## 2023-10-13 ENCOUNTER — Other Ambulatory Visit (HOSPITAL_COMMUNITY): Payer: Medicaid Other

## 2023-10-13 ENCOUNTER — Encounter: Payer: Self-pay | Admitting: Hematology

## 2023-10-14 ENCOUNTER — Inpatient Hospital Stay: Payer: Medicaid Other

## 2023-10-14 DIAGNOSIS — C538 Malignant neoplasm of overlapping sites of cervix uteri: Secondary | ICD-10-CM

## 2023-10-14 DIAGNOSIS — Z95828 Presence of other vascular implants and grafts: Secondary | ICD-10-CM

## 2023-10-15 ENCOUNTER — Inpatient Hospital Stay: Payer: Medicaid Other

## 2023-10-15 ENCOUNTER — Inpatient Hospital Stay: Payer: Medicaid Other | Admitting: Genetic Counselor

## 2023-10-15 DIAGNOSIS — Z801 Family history of malignant neoplasm of trachea, bronchus and lung: Secondary | ICD-10-CM

## 2023-10-15 DIAGNOSIS — C539 Malignant neoplasm of cervix uteri, unspecified: Secondary | ICD-10-CM | POA: Diagnosis not present

## 2023-10-15 DIAGNOSIS — Z7689 Persons encountering health services in other specified circumstances: Secondary | ICD-10-CM | POA: Diagnosis not present

## 2023-10-15 DIAGNOSIS — C55 Malignant neoplasm of uterus, part unspecified: Secondary | ICD-10-CM

## 2023-10-15 DIAGNOSIS — Z808 Family history of malignant neoplasm of other organs or systems: Secondary | ICD-10-CM | POA: Diagnosis not present

## 2023-10-15 DIAGNOSIS — C786 Secondary malignant neoplasm of retroperitoneum and peritoneum: Secondary | ICD-10-CM | POA: Diagnosis not present

## 2023-10-15 DIAGNOSIS — I10 Essential (primary) hypertension: Secondary | ICD-10-CM | POA: Diagnosis not present

## 2023-10-15 DIAGNOSIS — F17210 Nicotine dependence, cigarettes, uncomplicated: Secondary | ICD-10-CM | POA: Diagnosis not present

## 2023-10-15 DIAGNOSIS — C7802 Secondary malignant neoplasm of left lung: Secondary | ICD-10-CM | POA: Diagnosis not present

## 2023-10-15 DIAGNOSIS — C7801 Secondary malignant neoplasm of right lung: Secondary | ICD-10-CM | POA: Diagnosis not present

## 2023-10-15 DIAGNOSIS — Z95828 Presence of other vascular implants and grafts: Secondary | ICD-10-CM

## 2023-10-15 DIAGNOSIS — C787 Secondary malignant neoplasm of liver and intrahepatic bile duct: Secondary | ICD-10-CM | POA: Diagnosis not present

## 2023-10-15 DIAGNOSIS — G629 Polyneuropathy, unspecified: Secondary | ICD-10-CM | POA: Diagnosis not present

## 2023-10-15 DIAGNOSIS — D649 Anemia, unspecified: Secondary | ICD-10-CM | POA: Diagnosis not present

## 2023-10-15 DIAGNOSIS — C538 Malignant neoplasm of overlapping sites of cervix uteri: Secondary | ICD-10-CM

## 2023-10-15 LAB — COMPREHENSIVE METABOLIC PANEL
ALT: 17 U/L (ref 0–44)
AST: 30 U/L (ref 15–41)
Albumin: 3.2 g/dL — ABNORMAL LOW (ref 3.5–5.0)
Alkaline Phosphatase: 54 U/L (ref 38–126)
Anion gap: 11 (ref 5–15)
BUN: 7 mg/dL (ref 6–20)
CO2: 27 mmol/L (ref 22–32)
Calcium: 9 mg/dL (ref 8.9–10.3)
Chloride: 98 mmol/L (ref 98–111)
Creatinine, Ser: 1.15 mg/dL — ABNORMAL HIGH (ref 0.44–1.00)
GFR, Estimated: 58 mL/min — ABNORMAL LOW (ref >60)
Glucose, Bld: 96 mg/dL (ref 70–99)
Potassium: 4.4 mmol/L (ref 3.5–5.1)
Sodium: 136 mmol/L (ref 135–145)
Total Bilirubin: 0.5 mg/dL (ref 0.0–1.2)
Total Protein: 7.5 g/dL (ref 6.5–8.1)

## 2023-10-15 LAB — CBC WITH DIFFERENTIAL/PLATELET
Abs Immature Granulocytes: 0.01 10*3/uL (ref 0.00–0.07)
Basophils Absolute: 0 10*3/uL (ref 0.0–0.1)
Basophils Relative: 0 %
Eosinophils Absolute: 0.1 10*3/uL (ref 0.0–0.5)
Eosinophils Relative: 2 %
HCT: 35 % — ABNORMAL LOW (ref 36.0–46.0)
Hemoglobin: 11.4 g/dL — ABNORMAL LOW (ref 12.0–15.0)
Immature Granulocytes: 0 %
Lymphocytes Relative: 44 %
Lymphs Abs: 2.1 10*3/uL (ref 0.7–4.0)
MCH: 34.2 pg — ABNORMAL HIGH (ref 26.0–34.0)
MCHC: 32.6 g/dL (ref 30.0–36.0)
MCV: 105.1 fL — ABNORMAL HIGH (ref 80.0–100.0)
Monocytes Absolute: 0.6 10*3/uL (ref 0.1–1.0)
Monocytes Relative: 13 %
Neutro Abs: 2 10*3/uL (ref 1.7–7.7)
Neutrophils Relative %: 41 %
Platelets: 97 10*3/uL — ABNORMAL LOW (ref 150–400)
RBC: 3.33 MIL/uL — ABNORMAL LOW (ref 3.87–5.11)
RDW: 15.5 % (ref 11.5–15.5)
Smear Review: DECREASED
WBC: 4.9 10*3/uL (ref 4.0–10.5)
nRBC: 0 % (ref 0.0–0.2)

## 2023-10-15 LAB — MAGNESIUM: Magnesium: 1.6 mg/dL — ABNORMAL LOW (ref 1.7–2.4)

## 2023-10-15 NOTE — Progress Notes (Addendum)
 REFERRING PROVIDER: Billie Lade, MD [address] [place],  [place] [address]  PRIMARY PROVIDER:  Billie Lade, MD  PRIMARY REASON FOR VISIT:  1. Malignant neoplasm of uterus, unspecified site (HCC)      HISTORY OF PRESENT ILLNESS:  I connected with  Ms. Pakula on 10/14/2022 at 11:15 AM EDT by MyChart video conference and verified that I am speaking with the correct person using two identifiers.   Patient location: [facility]  Provider location: [facility]   Ms. Huebsch, a 53 y.o. female, was seen for a [facility] cancer genetics consultation at the request of Dr. Durwin Nora due to a personal history of uterine cancer.  Ms. Peggs presents to clinic today to discuss the possibility of a hereditary predisposition to cancer, genetic testing, and to further clarify her future cancer risks, as well as potential cancer risks for family members.   In January 2024, at the age of 69, Ms. Gillihan was diagnosed with uterine cancer.   CANCER HISTORY:  Oncology History Overview Note  PD-L1 CPS 8%   Cervical cancer (HCC)  09/19/2022 Initial Diagnosis   Cervical cancer (HCC)   10/02/2022 -  Chemotherapy   [name]        RISK FACTORS:  Menarche was at age 61.  First live birth at age 15.   Ovaries intact: yes.  Hysterectomy: no.  Menopausal status: postmenopausal.  HRT use: 0 years. Colonoscopy: no; not examined. Mammogram within the last year: unsure. Number of breast biopsies: 0. Up to date with pelvic exams: yes. Any excessive radiation exposure in the past: no  Past Medical History:  Diagnosis Date   Anemia    Asthma    Bronchitis    Cancer (HCC)    Cervical   CHF (congestive heart failure) (HCC)    COPD (chronic obstructive pulmonary disease) (HCC)    Depression    GERD (gastroesophageal reflux disease)    Headache    Hypertension    Port-A-Cath in place  09/25/2022    Past Surgical History:  Procedure Laterality Date   BREAST SURGERY     CESAREAN SECTION     IR IMAGING GUIDED PORT INSERTION  09/30/2022   LEG SURGERY      Social History   Socioeconomic History   Marital status: Married    Spouse name: Not on file   Number of children: Not on file   Years of education: Not on file   Highest education level: Not on file  Occupational History   Not on file  Tobacco Use   Smoking status: Every Day    Current packs/day: 0.25    Average packs/day: 0.3 packs/day for 23.0 years (5.8 ttl pk-yrs)    Types: Cigarettes   Smokeless tobacco: Never  Vaping Use   Vaping status: Never Used  Substance and Sexual Activity   Alcohol use: Yes    Comment: occ   Drug use: Yes    Types: Marijuana   Sexual activity: Yes    Birth control/protection: None  Other Topics Concern   Not on file  Social History Narrative   Not on file   Social Drivers of Health   Financial Resource Strain: Low Risk  (09/09/2022)   Overall Financial Resource Strain (CARDIA)    Difficulty of Paying Living Expenses: Not hard at all  Food Insecurity: No Food Insecurity (09/22/2022)  Hunger Vital Sign    Worried About Running Out of Food in the Last Year: Never true    Ran Out of Food in the Last Year: Never true  Transportation Needs: No Transportation Needs (09/22/2022)   PRAPARE - Administrator, Civil Service (Medical): No    Lack of Transportation (Non-Medical): No  Recent Concern: Transportation Needs - Unmet Transportation Needs (09/09/2022)   PRAPARE - Transportation    Lack of Transportation (Medical): Yes    Lack of Transportation (Non-Medical): Yes  Physical Activity: Insufficiently Active (09/09/2022)   Exercise Vital Sign    Days of Exercise per Week: 2 days    Minutes of Exercise per Session: 20 min  Stress: No Stress Concern Present (09/09/2022)   [employer] of Occupational Health - Occupational Stress Questionnaire    Feeling of  Stress : Only a little  Social Connections: Socially Isolated (09/09/2022)   Social Connection and Isolation Panel [NHANES]    Frequency of Communication with Friends and Family: Three times a week    Frequency of Social Gatherings with Friends and Family: More than three times a week    Attends Religious Services: Never    Database administrator or Organizations: No    Attends Banker Meetings: Never    Marital Status: Separated     FAMILY HISTORY:  We obtained a detailed, 4-generation family history.  Significant diagnoses are listed below: Family History  Problem Relation Age of Onset   Dermatomyositis Mother    Hypertension Mother    Cancer Mother        lung   Cancer Father        lung   Heart disease Father    Lung cancer Paternal Uncle    Lung cancer Maternal Uncle    Colon cancer Neg Hx    Breast cancer Neg Hx    Ovarian cancer Neg Hx    Endometrial cancer Neg Hx    Pancreatic cancer Neg Hx    Prostate cancer Neg Hx        Ms. Campos is unaware of previous family history of genetic testing for hereditary cancer risks. There is no reported Ashkenazi Jewish ancestry. There is no known consanguinity.  GENETIC COUNSELING ASSESSMENT: Ms. Ayars is a 53 y.o. female with a personal history of uterine cancer which is somewhat suggestive of a hereditary cancer syndrome and predisposition to cancer given her young age of onset. We, therefore, discussed and recommended the following at today's visit.   DISCUSSION: We discussed that, in general, most cancer is not inherited in families, but instead is sporadic or familial. Sporadic cancers occur by chance and typically happen at older ages (>50 years) as this type of cancer is caused by genetic changes acquired during an individual's lifetime. Some families have more cancers than would be expected by chance; however, the ages or types of cancer are not consistent with a known genetic mutation or known genetic mutations have  been ruled out. This type of familial cancer is thought to be due to a combination of multiple genetic, environmental, hormonal, and lifestyle factors. While this combination of factors likely increases the risk of cancer, the exact source of this risk is not currently identifiable or testable.  We discussed that 5 - 10% of cancer is hereditary, with most cases of uterine cancer associated with Lynch syndrome.  There are other genes that can be associated with hereditary uterine cancer syndromes.  These include PTEN and a  few others.  We discussed that testing is beneficial for several reasons including knowing how to follow individuals after completing their treatment, identifying whether potential treatment options such as PARP inhibitors would be beneficial, and understand if other family members could be at risk for cancer and allow them to undergo genetic testing.   We reviewed the characteristics, features and inheritance patterns of hereditary cancer syndromes. We also discussed genetic testing, including the appropriate family members to test, the process of testing, insurance coverage and turn-around-time for results. We discussed the implications of a negative, positive, carrier and/or variant of uncertain significant result. Ms. Clavijo  was offered a common hereditary cancer panel (36+ genes) and an expanded pan-cancer panel (70+ genes). Ms. Avis was informed of the benefits and limitations of each panel, including that expanded pan-cancer panels contain genes that do not have clear management guidelines at this point in time.  We also discussed that as the number of genes included on a panel increases, the chances of variants of uncertain significance increases. Ms. Stokely decided to pursue genetic testing for the CancerNext+RNA gene panel.   The [facility] CancerNext+RNAinsight Panel includes sequencing, rearrangement analysis, and RNA analysis for the following 39 genes: APC, ATM, BAP1, BARD1, BMPR1A,  BRCA1, BRCA2, BRIP1, CDH1, CDKN2A, CHEK2, FH, FLCN, MET, MLH1, MSH2, MSH6, MUTYH, NF1, NTHL1, PALB2, PMS2, PTEN, RAD51C, RAD51D, SMAD4, STK11, TP53, TSC1, TSC2, and VHL (sequencing and deletion/duplication); AXIN2, HOXB13, MBD4, MSH3, POLD1 and POLE (sequencing only); EPCAM and GREM1 (deletion/duplication only).   Based on Ms. Stephens's personal history of cancer, she meets medical criteria for genetic testing. Despite that she meets criteria, she may still have an out of pocket cost. We discussed that if her out of pocket cost for testing is over $100, the laboratory will call and confirm whether she wants to proceed with testing.  If the out of pocket cost of testing is less than $100 she will be billed by the genetic testing laboratory.   We discussed that some people do not want to undergo genetic testing due to fear of genetic discrimination.  The Genetic Information Nondiscrimination Act (GINA) was signed into federal law in 2008. GINA prohibits health insurers and most employers from discriminating against individuals based on genetic information (including the results of genetic tests and family history information). According to GINA, health insurance companies cannot consider genetic information to be a preexisting condition, nor can they use it to make decisions regarding coverage or rates. GINA also makes it illegal for most employers to use genetic information in making decisions about hiring, firing, promotion, or terms of employment. It is important to note that GINA does not offer protections for life insurance, disability insurance, or long-term care insurance. GINA does not apply to those in the [employer], those who work for companies with less than 15 employees, and new life insurance or long-term disability insurance policies.  Health status due to a cancer diagnosis is not protected under GINA. More information about GINA can be found by visiting [email or web address].  PLAN: After considering the  risks, benefits, and limitations, Ms. Karam provided informed consent to pursue genetic testing and the blood sample was sent to [facility] for analysis of the CancerNext+RNA. Results should be available within approximately 2-3 weeks' time, at which point they will be disclosed by telephone to Ms. Hale, as will any additional recommendations warranted by these results. Ms. Elmendorf will receive a summary of her genetic counseling visit and a copy of her results once available.  This information will also be available in Epic.   Lastly, we encouraged Ms. Chong to remain in contact with cancer genetics annually so that we can continuously update the family history and inform her of any changes in cancer genetics and testing that may be of benefit for this family.   Ms. Graciano questions were answered to her satisfaction today. Our contact information was provided should additional questions or concerns arise. Thank you for the referral and allowing [place] to share in the care of your patient.   Alanii Ramer P. Lowell Guitar, MS, CGC Licensed, Patent attorney [email or web address] phone: [phone]  46 minutes were spent on the date of the encounter in service to the patient including preparation, face-to-face consultation, documentation and care coordination.  The patient was seen alone.  Drs. Meliton Rattan, and/or [place] were available for questions, if needed..    _______________________________________________________________________ For Office Staff:  Number of people involved in session: 1 Was an Intern/ student involved with case: no

## 2023-10-17 ENCOUNTER — Other Ambulatory Visit: Payer: Self-pay

## 2023-10-19 ENCOUNTER — Ambulatory Visit (HOSPITAL_COMMUNITY)
Admission: RE | Admit: 2023-10-19 | Discharge: 2023-10-19 | Disposition: A | Discharge status: Home / Self Care | Payer: Medicaid Other | Source: Ambulatory Visit | Attending: Hematology | Admitting: Hematology

## 2023-10-19 DIAGNOSIS — R590 Localized enlarged lymph nodes: Secondary | ICD-10-CM | POA: Diagnosis not present

## 2023-10-19 DIAGNOSIS — C7802 Secondary malignant neoplasm of left lung: Secondary | ICD-10-CM | POA: Diagnosis not present

## 2023-10-19 DIAGNOSIS — Z7689 Persons encountering health services in other specified circumstances: Secondary | ICD-10-CM | POA: Diagnosis not present

## 2023-10-19 DIAGNOSIS — C538 Malignant neoplasm of overlapping sites of cervix uteri: Secondary | ICD-10-CM | POA: Insufficient documentation

## 2023-10-19 DIAGNOSIS — C539 Malignant neoplasm of cervix uteri, unspecified: Secondary | ICD-10-CM | POA: Diagnosis not present

## 2023-10-19 DIAGNOSIS — C7801 Secondary malignant neoplasm of right lung: Secondary | ICD-10-CM | POA: Diagnosis not present

## 2023-10-19 MED ORDER — IOHEXOL 300 MG/ML  SOLN
100.0000 mL | Freq: Once | INTRAMUSCULAR | Status: AC | PRN
Start: 2023-10-19 — End: 2023-10-19
  Administered 2023-10-19: 100 mL via INTRAVENOUS

## 2023-10-25 ENCOUNTER — Emergency Department (HOSPITAL_COMMUNITY): Payer: Medicaid Other

## 2023-10-25 ENCOUNTER — Encounter (HOSPITAL_COMMUNITY): Payer: Self-pay | Admitting: Emergency Medicine

## 2023-10-25 ENCOUNTER — Inpatient Hospital Stay (HOSPITAL_COMMUNITY)
Admission: EM | Admit: 2023-10-25 | Discharge: 2023-10-28 | DRG: 193 | Disposition: A | Discharge status: Home / Self Care | Payer: Medicaid Other | Attending: Internal Medicine | Admitting: Internal Medicine

## 2023-10-25 ENCOUNTER — Other Ambulatory Visit: Payer: Self-pay

## 2023-10-25 DIAGNOSIS — R069 Unspecified abnormalities of breathing: Secondary | ICD-10-CM | POA: Diagnosis not present

## 2023-10-25 DIAGNOSIS — C7800 Secondary malignant neoplasm of unspecified lung: Secondary | ICD-10-CM | POA: Diagnosis present

## 2023-10-25 DIAGNOSIS — F17210 Nicotine dependence, cigarettes, uncomplicated: Secondary | ICD-10-CM | POA: Diagnosis present

## 2023-10-25 DIAGNOSIS — K219 Gastro-esophageal reflux disease without esophagitis: Secondary | ICD-10-CM | POA: Diagnosis not present

## 2023-10-25 DIAGNOSIS — Z8249 Family history of ischemic heart disease and other diseases of the circulatory system: Secondary | ICD-10-CM | POA: Diagnosis not present

## 2023-10-25 DIAGNOSIS — G893 Neoplasm related pain (acute) (chronic): Secondary | ICD-10-CM | POA: Diagnosis present

## 2023-10-25 DIAGNOSIS — Z6828 Body mass index (BMI) 28.0-28.9, adult: Secondary | ICD-10-CM

## 2023-10-25 DIAGNOSIS — Z716 Tobacco abuse counseling: Secondary | ICD-10-CM

## 2023-10-25 DIAGNOSIS — Z79899 Other long term (current) drug therapy: Secondary | ICD-10-CM

## 2023-10-25 DIAGNOSIS — C538 Malignant neoplasm of overlapping sites of cervix uteri: Secondary | ICD-10-CM | POA: Diagnosis not present

## 2023-10-25 DIAGNOSIS — C539 Malignant neoplasm of cervix uteri, unspecified: Secondary | ICD-10-CM | POA: Diagnosis not present

## 2023-10-25 DIAGNOSIS — E871 Hypo-osmolality and hyponatremia: Secondary | ICD-10-CM | POA: Diagnosis not present

## 2023-10-25 DIAGNOSIS — D630 Anemia in neoplastic disease: Secondary | ICD-10-CM | POA: Diagnosis present

## 2023-10-25 DIAGNOSIS — J101 Influenza due to other identified influenza virus with other respiratory manifestations: Secondary | ICD-10-CM | POA: Diagnosis not present

## 2023-10-25 DIAGNOSIS — C801 Malignant (primary) neoplasm, unspecified: Secondary | ICD-10-CM | POA: Diagnosis not present

## 2023-10-25 DIAGNOSIS — C787 Secondary malignant neoplasm of liver and intrahepatic bile duct: Secondary | ICD-10-CM | POA: Diagnosis present

## 2023-10-25 DIAGNOSIS — Z801 Family history of malignant neoplasm of trachea, bronchus and lung: Secondary | ICD-10-CM

## 2023-10-25 DIAGNOSIS — Z881 Allergy status to other antibiotic agents status: Secondary | ICD-10-CM | POA: Diagnosis not present

## 2023-10-25 DIAGNOSIS — E46 Unspecified protein-calorie malnutrition: Secondary | ICD-10-CM | POA: Insufficient documentation

## 2023-10-25 DIAGNOSIS — E872 Acidosis, unspecified: Secondary | ICD-10-CM | POA: Diagnosis not present

## 2023-10-25 DIAGNOSIS — J441 Chronic obstructive pulmonary disease with (acute) exacerbation: Secondary | ICD-10-CM | POA: Diagnosis not present

## 2023-10-25 DIAGNOSIS — E860 Dehydration: Secondary | ICD-10-CM | POA: Diagnosis present

## 2023-10-25 DIAGNOSIS — I499 Cardiac arrhythmia, unspecified: Secondary | ICD-10-CM | POA: Diagnosis not present

## 2023-10-25 DIAGNOSIS — Z91018 Allergy to other foods: Secondary | ICD-10-CM | POA: Diagnosis not present

## 2023-10-25 DIAGNOSIS — C7802 Secondary malignant neoplasm of left lung: Secondary | ICD-10-CM | POA: Diagnosis not present

## 2023-10-25 DIAGNOSIS — J9601 Acute respiratory failure with hypoxia: Secondary | ICD-10-CM | POA: Diagnosis not present

## 2023-10-25 DIAGNOSIS — R000 Tachycardia, unspecified: Secondary | ICD-10-CM | POA: Diagnosis present

## 2023-10-25 DIAGNOSIS — I10 Essential (primary) hypertension: Secondary | ICD-10-CM | POA: Diagnosis present

## 2023-10-25 DIAGNOSIS — Z1152 Encounter for screening for COVID-19: Secondary | ICD-10-CM

## 2023-10-25 DIAGNOSIS — Z8541 Personal history of malignant neoplasm of cervix uteri: Secondary | ICD-10-CM | POA: Diagnosis not present

## 2023-10-25 DIAGNOSIS — E440 Moderate protein-calorie malnutrition: Secondary | ICD-10-CM | POA: Diagnosis present

## 2023-10-25 DIAGNOSIS — R0689 Other abnormalities of breathing: Secondary | ICD-10-CM | POA: Diagnosis not present

## 2023-10-25 DIAGNOSIS — Z743 Need for continuous supervision: Secondary | ICD-10-CM | POA: Diagnosis not present

## 2023-10-25 DIAGNOSIS — N179 Acute kidney failure, unspecified: Secondary | ICD-10-CM | POA: Diagnosis not present

## 2023-10-25 DIAGNOSIS — R0902 Hypoxemia: Secondary | ICD-10-CM | POA: Diagnosis not present

## 2023-10-25 DIAGNOSIS — C7801 Secondary malignant neoplasm of right lung: Secondary | ICD-10-CM | POA: Diagnosis not present

## 2023-10-25 DIAGNOSIS — E8809 Other disorders of plasma-protein metabolism, not elsewhere classified: Secondary | ICD-10-CM | POA: Diagnosis not present

## 2023-10-25 LAB — RESP PANEL BY RT-PCR (RSV, FLU A&B, COVID)  RVPGX2
Influenza A by PCR: POSITIVE — AB
Influenza B by PCR: NEGATIVE
Resp Syncytial Virus by PCR: NEGATIVE
SARS Coronavirus 2 by RT PCR: NEGATIVE

## 2023-10-25 LAB — CBC
HCT: 35.6 % — ABNORMAL LOW (ref 36.0–46.0)
Hemoglobin: 12.5 g/dL (ref 12.0–15.0)
MCH: 33.6 pg (ref 26.0–34.0)
MCHC: 35.1 g/dL (ref 30.0–36.0)
MCV: 95.7 fL (ref 80.0–100.0)
Platelets: 292 10*3/uL (ref 150–400)
RBC: 3.72 MIL/uL — ABNORMAL LOW (ref 3.87–5.11)
RDW: 15.3 % (ref 11.5–15.5)
WBC: 6.7 10*3/uL (ref 4.0–10.5)
nRBC: 0 % (ref 0.0–0.2)

## 2023-10-25 LAB — BASIC METABOLIC PANEL
Anion gap: 18 — ABNORMAL HIGH (ref 5–15)
BUN: 44 mg/dL — ABNORMAL HIGH (ref 6–20)
CO2: 22 mmol/L (ref 22–32)
Calcium: 8.9 mg/dL (ref 8.9–10.3)
Chloride: 91 mmol/L — ABNORMAL LOW (ref 98–111)
Creatinine, Ser: 2.5 mg/dL — ABNORMAL HIGH (ref 0.44–1.00)
GFR, Estimated: 23 mL/min — ABNORMAL LOW (ref >60)
Glucose, Bld: 112 mg/dL — ABNORMAL HIGH (ref 70–99)
Potassium: 3.7 mmol/L (ref 3.5–5.1)
Sodium: 131 mmol/L — ABNORMAL LOW (ref 135–145)

## 2023-10-25 LAB — HEPATIC FUNCTION PANEL
ALT: 38 U/L (ref 0–44)
AST: 40 U/L (ref 15–41)
Albumin: 3 g/dL — ABNORMAL LOW (ref 3.5–5.0)
Alkaline Phosphatase: 75 U/L (ref 38–126)
Bilirubin, Direct: 0.1 mg/dL (ref 0.0–0.2)
Indirect Bilirubin: 0.7 mg/dL (ref 0.3–0.9)
Total Bilirubin: 0.8 mg/dL (ref 0.0–1.2)
Total Protein: 7.7 g/dL (ref 6.5–8.1)

## 2023-10-25 LAB — BRAIN NATRIURETIC PEPTIDE: B Natriuretic Peptide: 249 pg/mL — ABNORMAL HIGH (ref 0.0–100.0)

## 2023-10-25 LAB — LACTIC ACID, PLASMA: Lactic Acid, Venous: 2.4 mmol/L (ref 0.5–1.9)

## 2023-10-25 LAB — APTT: aPTT: 26 s (ref 24–36)

## 2023-10-25 LAB — PROTIME-INR
INR: 1 (ref 0.8–1.2)
Prothrombin Time: 13.3 s (ref 11.4–15.2)

## 2023-10-25 MED ORDER — METHYLPREDNISOLONE SODIUM SUCC 125 MG IJ SOLR
125.0000 mg | Freq: Once | INTRAMUSCULAR | Status: AC
  Administered 2023-10-25: 125 mg via INTRAVENOUS
  Filled 2023-10-25: qty 2

## 2023-10-25 MED ORDER — MAGNESIUM SULFATE 2 GM/50ML IV SOLN
2.0000 g | Freq: Once | INTRAVENOUS | Status: AC
  Administered 2023-10-25: 2 g via INTRAVENOUS
  Filled 2023-10-25: qty 50

## 2023-10-25 MED ORDER — HYDROMORPHONE HCL 1 MG/ML IJ SOLN
0.5000 mg | Freq: Once | INTRAMUSCULAR | Status: AC
  Administered 2023-10-25: 0.5 mg via INTRAVENOUS
  Filled 2023-10-25: qty 0.5

## 2023-10-25 MED ORDER — SODIUM CHLORIDE 0.9 % IV BOLUS
2000.0000 mL | Freq: Once | INTRAVENOUS | Status: AC
  Administered 2023-10-25: 2000 mL via INTRAVENOUS

## 2023-10-25 MED ORDER — LEVOFLOXACIN IN D5W 750 MG/150ML IV SOLN
750.0000 mg | Freq: Once | INTRAVENOUS | Status: AC
  Administered 2023-10-25: 750 mg via INTRAVENOUS
  Filled 2023-10-25: qty 150

## 2023-10-25 MED ORDER — OXYCODONE-ACETAMINOPHEN 5-325 MG PO TABS
1.0000 | ORAL_TABLET | Freq: Once | ORAL | Status: AC
  Administered 2023-10-25: 1 via ORAL
  Filled 2023-10-25: qty 1

## 2023-10-25 MED ORDER — ALBUTEROL SULFATE HFA 108 (90 BASE) MCG/ACT IN AERS: 2.0000 | INHALATION_SPRAY | RESPIRATORY_TRACT | Status: DC | PRN

## 2023-10-25 NOTE — ED Provider Notes (Signed)
 [facility] EMERGENCY DEPARTMENT AT [facility] Provider Note   CSN: [id number] Arrival date & time: 10/25/23  2142     History  Chief Complaint  Patient presents with   Shortness of Breath    Kristin Carson is a 53 y.o. female.  Patient has a history of cervical cancer with metastatic disease to the chest.  Patient states that she caught a cold from her husband.  She has been coughing up yellow-green stuff since Monday.  She complains of being really short of breath.  Patient also has a history of COPD  The history is provided by the patient and medical records. No language interpreter was used.  Shortness of Breath Severity:  Moderate Onset quality:  Sudden Timing:  Constant Progression:  Worsening Chronicity:  Recurrent Context: activity   Relieved by:  Nothing Worsened by:  Nothing Ineffective treatments:  None tried Associated symptoms: no abdominal pain, no chest pain, no cough, no headaches and no rash        Home Medications Prior to Admission medications   Medication Sig Start Date End Date Taking? Authorizing Provider  albuterol (VENTOLIN HFA) 108 (90 Base) MCG/ACT inhaler Inhale 2 puffs into the lungs every 6 (six) hours as needed for wheezing or shortness of breath. 05/26/23   Billie Lade, MD  amLODipine (NORVASC) 10 MG tablet Take 1 tablet (10 mg total) by mouth daily. 09/08/23   Billie Lade, MD  amoxicillin-clavulanate (AUGMENTIN) 875-125 MG tablet Take 1 tablet by mouth 2 (two) times daily. 08/06/23   Doreatha Massed, MD  Atezolizumab (TECENTRIQ IV) Inject into the vein every 21 ( twenty-one) days. Patient not taking: Reported on 10/08/2023 10/02/22   [provider]  Bevacizumab (AVASTIN IV) Inject into the vein every 21 ( twenty-one) days.    [provider]  cetirizine (ZYRTEC ALLERGY) 10 MG tablet Take 1 tablet (10 mg total) by mouth daily. 05/24/23   Carmel Sacramento A, PA-C  HYDROcodone-acetaminophen (NORCO/VICODIN) 5-325  MG tablet Take 1 tablet by mouth every 6 (six) hours as needed for moderate pain (pain score 4-6). 10/09/23   Cindie Crumbly, MD  lidocaine-prilocaine (EMLA) cream Apply a dime-sized amount to port a cath site and cover with plastic wrap 1 hour prior to infusion appointments 06/04/23   Doreatha Massed, MD  loperamide (IMODIUM) 2 MG capsule Take 1 capsule (2 mg total) by mouth as needed for diarrhea or loose stools (Take 2 capsules after the first loose stool and 1 capsule after each loose stool thereafter.  DO NOT EXCEED 8 capsules a day.). 03/12/23   Doreatha Massed, MD  megestrol (MEGACE) 400 MG/10ML suspension Take 10 mLs (400 mg total) by mouth 2 (two) times daily. 06/09/23   Doreatha Massed, MD  naproxen sodium (ALEVE) 220 MG tablet Take 220 mg by mouth daily as needed (pain).    [provider]  nicotine polacrilex (NICORETTE) 4 MG gum Take 1 each (4 mg total) by mouth as needed for smoking cessation. 05/26/23   Billie Lade, MD  ondansetron (ZOFRAN) 8 MG tablet Take 1 tablet (8 mg total) by mouth every 8 (eight) hours as needed for nausea or vomiting. Start on the third day after cisplatin. 09/09/23   Doreatha Massed, MD  pantoprazole (PROTONIX) 40 MG tablet Take 1 tablet (40 mg total) by mouth daily. 05/26/23   Billie Lade, MD  pregabalin (LYRICA) 200 MG capsule Take 1 capsule (200 mg total) by mouth 3 (three) times daily. 10/09/23  Cindie Crumbly, MD  prochlorperazine (COMPAZINE) 10 MG tablet Take 1 tablet (10 mg total) by mouth every 6 (six) hours as needed (Nausea or vomiting). 06/04/23   Doreatha Massed, MD  famotidine (PEPCID) 20 MG tablet Take 1 tablet (20 mg total) by mouth 2 (two) times daily. 12/18/17 02/02/20  Devoria Albe, MD  hydrochlorothiazide (HYDRODIURIL) 25 MG tablet Take 1 tablet (25 mg total) by mouth daily. 06/17/19 02/02/20  Devoria Albe, MD      Allergies    Carrot [daucus carota], Lisinopril-hydrochlorothiazide, Ibuprofen, Other, Tramadol, and  Erythromycin    Review of Systems   Review of Systems  Constitutional:  Negative for appetite change and fatigue.  HENT:  Negative for congestion, ear discharge and sinus pressure.   Eyes:  Negative for discharge.  Respiratory:  Positive for shortness of breath. Negative for cough.   Cardiovascular:  Negative for chest pain.  Gastrointestinal:  Negative for abdominal pain and diarrhea.  Genitourinary:  Negative for frequency and hematuria.  Musculoskeletal:  Negative for back pain.  Skin:  Negative for rash.  Neurological:  Negative for seizures and headaches.  Psychiatric/Behavioral:  Negative for hallucinations.     Physical Exam Updated Vital Signs BP 111/81   Pulse (!) 116   Temp 98.1 F (36.7 C) (Oral)   Resp (!) 49   Ht 5' 3.5" (1.613 m)   Wt 75 kg   LMP 08/15/2022 (Approximate)   SpO2 96%   BMI 28.83 kg/m  Physical Exam Vitals and nursing note reviewed.  Constitutional:      Appearance: She is well-developed.  HENT:     Head: Normocephalic.     Nose: Nose normal.  Eyes:     General: No scleral icterus.    Conjunctiva/sclera: Conjunctivae normal.  Neck:     Thyroid: No thyromegaly.  Cardiovascular:     Rate and Rhythm: Normal rate and regular rhythm.     Heart sounds: No murmur heard.    No friction rub. No gallop.  Pulmonary:     Breath sounds: No stridor. Wheezing present. No rales.  Chest:     Chest wall: No tenderness.  Abdominal:     General: There is no distension.     Tenderness: There is no abdominal tenderness. There is no rebound.  Musculoskeletal:        General: Normal range of motion.     Cervical back: Neck supple.  Lymphadenopathy:     Cervical: No cervical adenopathy.  Skin:    Findings: No erythema or rash.  Neurological:     Mental Status: She is alert and oriented to person, place, and time.     Motor: No abnormal muscle tone.     Coordination: Coordination normal.  Psychiatric:        Behavior: Behavior normal.     ED  Results / Procedures / Treatments   Labs (all labs ordered are listed, but only abnormal results are displayed) Labs Reviewed  RESP PANEL BY RT-PCR (RSV, FLU A&B, COVID)  RVPGX2 - Abnormal; Notable for the following components:      Result Value   Influenza A by PCR POSITIVE (*)    All other components within normal limits  BASIC METABOLIC PANEL - Abnormal; Notable for the following components:   Sodium 131 (*)    Chloride 91 (*)    Glucose, Bld 112 (*)    BUN 44 (*)    Creatinine, Ser 2.50 (*)    GFR, Estimated 23 (*)  Anion gap 18 (*)    All other components within normal limits  CBC - Abnormal; Notable for the following components:   RBC 3.72 (*)    HCT 35.6 (*)    All other components within normal limits  HEPATIC FUNCTION PANEL - Abnormal; Notable for the following components:   Albumin 3.0 (*)    All other components within normal limits  BRAIN NATRIURETIC PEPTIDE - Abnormal; Notable for the following components:   B Natriuretic Peptide 249.0 (*)    All other components within normal limits  CULTURE, BLOOD (ROUTINE X 2)  CULTURE, BLOOD (ROUTINE X 2)  PROTIME-INR  APTT  LACTIC ACID, PLASMA  LACTIC ACID, PLASMA    EKG None  Radiology DG Chest Portable 1 View Result Date: 10/25/2023 CLINICAL DATA:  Difficulty breathing, flu like symptoms EXAM: PORTABLE CHEST 1 VIEW COMPARISON:  08/29/2022.  Chest CT 10/19/2023 FINDINGS: Pulmonary nodules are noted in the left upper lobe and left lower lung. Nodular densities noted in the right mid lung and right lung base. These appear new and/or enlarged since recent CT. No effusions. Heart and mediastinal contours within normal limits. Right Port-A-Cath is unchanged since prior CT. IMPRESSION: Bilateral pulmonary nodules/metastases, some of which appear enlarged since recent CT. Electronically Signed   By: Charlett Nose M.D.   On: 10/25/2023 22:15    Procedures Procedures    Medications Ordered in ED Medications  albuterol  (VENTOLIN HFA) 108 (90 Base) MCG/ACT inhaler 2 puff (has no administration in time range)  levofloxacin (LEVAQUIN) IVPB 750 mg (750 mg Intravenous New Bag/Given 10/25/23 2258)  oxyCODONE-acetaminophen (PERCOCET/ROXICET) 5-325 MG per tablet 1 tablet (has no administration in time range)  methylPREDNISolone sodium succinate (SOLU-MEDROL) 125 mg/2 mL injection 125 mg (125 mg Intravenous Given 10/25/23 2218)  sodium chloride 0.9 % bolus 2,000 mL (2,000 mLs Intravenous New Bag/Given 10/25/23 2219)  magnesium sulfate IVPB 2 g 50 mL (0 g Intravenous Stopped 10/25/23 2255)    ED Course/ Medical Decision Making/ A&P CRITICAL CARE Performed by: Bethann Berkshire Total critical care time: 45 minutes Critical care time was exclusive of separately billable procedures and treating other patients. Critical care was necessary to treat or prevent imminent or life-threatening deterioration. Critical care was time spent personally by me on the following activities: development of treatment plan with patient and/or surrogate as well as nursing, discussions with consultants, evaluation of patient's response to treatment, examination of patient, obtaining history from patient or surrogate, ordering and performing treatments and interventions, ordering and review of laboratory studies, ordering and review of radiographic studies, pulse oximetry and re-evaluation of patient's condition.  Click here for ABCD2, HEART and other calculatorsREFRESH Note before signing :1}                              Medical Decision Making Amount and/or Complexity of Data Reviewed Labs: ordered.  Risk Prescription drug management. Decision regarding hospitalization.   Patient with hypoxia and COPD exacerbation with influenza A.  She will be admitted to medicine        Final Clinical Impression(s) / ED Diagnoses Final diagnoses:  Influenza A  COPD exacerbation (HCC)    Rx / DC Orders ED Discharge Orders     None          Bethann Berkshire, MD 11/01/23 1709

## 2023-10-25 NOTE — H&P (Incomplete)
 History and Physical    Patient: Kristin Carson WJX:[id number] DOB: [birth year] DOA: 10/25/2023 DOS: the patient was seen and examined on 10/26/2023 PCP: Billie Lade, MD  Patient coming from: Home  Chief Complaint:  Chief Complaint  Patient presents with   Shortness of Breath   HPI: Kristin Carson is a 53 y.o. 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  She states that she was exposed to husband who had flu.  Shortness of breath worsened today, so she activated EMS and patient was taken to the ED for further evaluation and management.  ED Course:  In emergency department, she was tachypneic, tachycardic and patient was placed on supplemental oxygen via [place] at 6 LPM to maintain O2 sat of 94%.  Workup in the ED showed normal CBC except for hematocrit of 35.6.  BMP showed sodium 131, potassium 3.7, chloride 91, bicarb 22, blood glucose 112, BUN 44, creatinine 2.50, GFR 23, anion gap 18.  Albumin 3.0, BNP 249, lactic acid 2.4 > 1.5.  Influenza A was positive, influenza B, SARS coronavirus 2, RSV was negative. Chest x-ray showed bilateral pulmonary nodules/metastasis, some of which appear enlarged since recent CT. Patient was initially treated with IV Levaquin due to the presumption that she was having consequent pneumonia.  She was then treated with Dilaudid, breathing treatment, IV Solu-Medrol 25 mg x 1 and Percocet.  IV hydration was provided. Hospitalist was asked to admit patient for further evaluation and management.  Review of Systems: Review of systems as noted in the HPI. All other systems reviewed and are negative.   Past Medical History:  Diagnosis Date   Anemia    Asthma    Bronchitis    Cancer (HCC)    Cervical   CHF  (congestive heart failure) (HCC)    COPD (chronic obstructive pulmonary disease) (HCC)    Depression    GERD (gastroesophageal reflux disease)    Headache    Hypertension    Port-A-Cath in place 09/25/2022   Past Surgical History:  Procedure Laterality Date   BREAST SURGERY     CESAREAN SECTION     IR IMAGING GUIDED PORT INSERTION  09/30/2022   LEG SURGERY      Social History:  reports that she has been smoking cigarettes. She has a 5.8 pack-year smoking history. She has never used smokeless tobacco. She reports current alcohol use. She reports current drug use. Drug: Marijuana.   Allergies  Allergen Reactions   Carrot [Daucus Carota] Hives   Lisinopril-Hydrochlorothiazide     Oral swelling   Ibuprofen Other (See Comments)    Oral swelling   Other Itching and Other (See Comments)    Hair dye, blisters, pus-filled, soreness   Tramadol Hives   Erythromycin Hives    Family History  Problem Relation Age of Onset   Dermatomyositis Mother    Hypertension Mother    Cancer Mother        lung   Cancer Father        lung   Heart disease Father    Lung cancer Paternal Uncle    Lung cancer Maternal Uncle    Colon cancer Neg Hx    Breast cancer Neg Hx    Ovarian cancer Neg Hx    Endometrial cancer Neg  Hx    Pancreatic cancer Neg Hx    Prostate cancer Neg Hx      Prior to Admission medications   Medication Sig Start Date End Date Taking? Authorizing Provider  albuterol (VENTOLIN HFA) 108 (90 Base) MCG/ACT inhaler Inhale 2 puffs into the lungs every 6 (six) hours as needed for wheezing or shortness of breath. 05/26/23   Billie Lade, MD  amLODipine (NORVASC) 10 MG tablet Take 1 tablet (10 mg total) by mouth daily. 09/08/23   Billie Lade, MD  amoxicillin-clavulanate (AUGMENTIN) 875-125 MG tablet Take 1 tablet by mouth 2 (two) times daily. 08/06/23   Doreatha Massed, MD  Atezolizumab (TECENTRIQ IV) Inject into the vein every 21 ( twenty-one) days. Patient not taking:  Reported on 10/08/2023 10/02/22   [provider]  Bevacizumab (AVASTIN IV) Inject into the vein every 21 ( twenty-one) days.    [provider]  cetirizine (ZYRTEC ALLERGY) 10 MG tablet Take 1 tablet (10 mg total) by mouth daily. 05/24/23   Carmel Sacramento A, PA-C  HYDROcodone-acetaminophen (NORCO/VICODIN) 5-325 MG tablet Take 1 tablet by mouth every 6 (six) hours as needed for moderate pain (pain score 4-6). 10/09/23   Cindie Crumbly, MD  lidocaine-prilocaine (EMLA) cream Apply a dime-sized amount to port a cath site and cover with plastic wrap 1 hour prior to infusion appointments 06/04/23   Doreatha Massed, MD  loperamide (IMODIUM) 2 MG capsule Take 1 capsule (2 mg total) by mouth as needed for diarrhea or loose stools (Take 2 capsules after the first loose stool and 1 capsule after each loose stool thereafter.  DO NOT EXCEED 8 capsules a day.). 03/12/23   Doreatha Massed, MD  megestrol (MEGACE) 400 MG/10ML suspension Take 10 mLs (400 mg total) by mouth 2 (two) times daily. 06/09/23   Doreatha Massed, MD  naproxen sodium (ALEVE) 220 MG tablet Take 220 mg by mouth daily as needed (pain).    [provider]  nicotine polacrilex (NICORETTE) 4 MG gum Take 1 each (4 mg total) by mouth as needed for smoking cessation. 05/26/23   Billie Lade, MD  ondansetron (ZOFRAN) 8 MG tablet Take 1 tablet (8 mg total) by mouth every 8 (eight) hours as needed for nausea or vomiting. Start on the third day after cisplatin. 09/09/23   Doreatha Massed, MD  pantoprazole (PROTONIX) 40 MG tablet Take 1 tablet (40 mg total) by mouth daily. 05/26/23   Billie Lade, MD  pregabalin (LYRICA) 200 MG capsule Take 1 capsule (200 mg total) by mouth 3 (three) times daily. 10/09/23   Cindie Crumbly, MD  prochlorperazine (COMPAZINE) 10 MG tablet Take 1 tablet (10 mg total) by mouth every 6 (six) hours as needed (Nausea or vomiting). 06/04/23   Doreatha Massed, MD  famotidine (PEPCID) 20 MG  tablet Take 1 tablet (20 mg total) by mouth 2 (two) times daily. 12/18/17 02/02/20  Devoria Albe, MD  hydrochlorothiazide (HYDRODIURIL) 25 MG tablet Take 1 tablet (25 mg total) by mouth daily. 06/17/19 02/02/20  Devoria Albe, MD    Physical Exam: BP 104/71   Pulse (!) 122   Temp 98.5 F (36.9 C) (Oral)   Resp (!) 23   Ht 5\' 6"  (1.676 m)   Wt 70.4 kg   LMP 08/15/2022 (Approximate)   SpO2 99%   BMI 25.05 kg/m   General: 53 y.o. year-old female well developed well nourished in no acute distress.  Alert and oriented x3. HEENT: NCAT, EOMI Neck: Supple, trachea medial Cardiovascular:  Tachycardia.  Regular rate and rhythm with no rubs or gallops.  No thyromegaly or JVD noted.  No lower extremity edema. 2/4 pulses in all 4 extremities. Respiratory: Diffuse expiratory wheezes on auscultation with no rales.  Abdomen: Soft, nontender nondistended with normal bowel sounds x4 quadrants. Muskuloskeletal: No cyanosis, clubbing or edema noted bilaterally Neuro: CN II-XII intact, strength 5/5 x 4, sensation, reflexes intact Skin: No ulcerative lesions noted or rashes Psychiatry: Judgement and insight appear normal. Mood is appropriate for condition and setting          Labs on Admission:  Basic Metabolic Panel: Recent Labs  Lab 10/25/23 2156  NA 131*  K 3.7  CL 91*  CO2 22  GLUCOSE 112*  BUN 44*  CREATININE 2.50*  CALCIUM 8.9   Liver Function Tests: Recent Labs  Lab 10/25/23 2156  AST 40  ALT 38  ALKPHOS 75  BILITOT 0.8  PROT 7.7  ALBUMIN 3.0*   No results for input(s): "LIPASE", "AMYLASE" in the last 168 hours. No results for input(s): "AMMONIA" in the last 168 hours. CBC: Recent Labs  Lab 10/25/23 2156  WBC 6.7  HGB 12.5  HCT 35.6*  MCV 95.7  PLT 292   Cardiac Enzymes: No results for input(s): "CKTOTAL", "CKMB", "CKMBINDEX", "TROPONINI" in the last 168 hours.  BNP (last 3 results) Recent Labs    10/25/23 2156  BNP 249.0*    ProBNP (last 3 results) No results for  input(s): "PROBNP" in the last 8760 hours.  CBG: No results for input(s): "GLUCAP" in the last 168 hours.  Radiological Exams on Admission: DG Chest Portable 1 View Result Date: 10/25/2023 CLINICAL DATA:  Difficulty breathing, flu like symptoms EXAM: PORTABLE CHEST 1 VIEW COMPARISON:  08/29/2022.  Chest CT 10/19/2023 FINDINGS: Pulmonary nodules are noted in the left upper lobe and left lower lung. Nodular densities noted in the right mid lung and right lung base. These appear new and/or enlarged since recent CT. No effusions. Heart and mediastinal contours within normal limits. Right Port-A-Cath is unchanged since prior CT. IMPRESSION: Bilateral pulmonary nodules/metastases, some of which appear enlarged since recent CT. Electronically Signed   By: Charlett Nose M.D.   On: 10/25/2023 22:15    EKG: I independently viewed the EKG done and my findings are as followed: Sinus tachycardia at a rate of 143 bpm  Assessment/Plan Present on Admission:  Influenza A  AKI (acute kidney injury) (HCC)  Cervical cancer (HCC)  Essential hypertension  GERD (gastroesophageal reflux disease)  Principal Problem:   Influenza A Active Problems:   Essential hypertension   AKI (acute kidney injury) (HCC)   Cervical cancer (HCC)   GERD (gastroesophageal reflux disease)   Acute respiratory failure with hypoxia (HCC)   Hyponatremia   Hypoalbuminemia due to protein-calorie malnutrition (HCC)   Lactic acidosis  Influenza A COPD exacerbation Continue Xopenex, Mucinex, Robitussin, Solu-Medrol Continue Protonix to prevent steroid-induced ulcer Continue incentive spirometry and flutter valve  Acute respiratory failure with hypoxia Continue supplemental oxygen to maintain O2 sat > 92% with plan to wean patient off oxygen as tolerated (patient does not use supplemental oxygen at baseline)  Hyponatremia  Na 131,Continue to monitor sodium with serial BMPs Urine osmolality, serum osmolality and urine sodium will  be checked  Hypoalbuminemia secondary to moderate protein calorie malnutrition Albumin 3.0, protein supplement will be provided  Acute kidney injury creatinine 2.50 (baseline creatinine at 0.9-1.2) Continue IV hydration Renally adjust medications, avoid nephrotoxic agents/dehydration/hypotension  Lactic acidosis-resolved  Metastatic cervical cancer Chest  x-ray showed Bilateral pulmonary nodules/metastases, some of which appear enlarged since recent CT. Patient follows with Dr. Ellin Saba  Essential hypertension BP meds will be held at this time due to soft BP  GERD Continue Protonix   DVT prophylaxis: Lovenox  Code Status: Full code  Family Communication: Husband at bedside (all questions answered to satisfaction)  Consults: None  Severity of Illness: The appropriate patient status for this patient is INPATIENT. Inpatient status is judged to be reasonable and necessary in order to provide the required intensity of service to ensure the patient's safety. The patient's presenting symptoms, physical exam findings, and initial radiographic and laboratory data in the context of their chronic comorbidities is felt to place them at high risk for further clinical deterioration. Furthermore, it is not anticipated that the patient will be medically stable for discharge from the hospital within 2 midnights of admission.   * I certify that at the point of admission it is my clinical judgment that the patient will require inpatient hospital care spanning beyond 2 midnights from the point of admission due to high intensity of service, high risk for further deterioration and high frequency of surveillance required.*  Author: Frankey Shown, DO 10/26/2023 2:15 AM  For on call review [email or web address].

## 2023-10-25 NOTE — ED Triage Notes (Signed)
 Pt bib EMS with c/o difficulty breathing. Pt with hx of COPD. Pt also c/o flu like symptoms. Pt states her husband recently had the flu. Pt currently on 6L nasal cannula but does not require home O2. Pt also states that she has ran out of her inhaler.

## 2023-10-25 NOTE — Progress Notes (Signed)
 Pt being followed by [facility] for Sepsis protocol.

## 2023-10-26 ENCOUNTER — Inpatient Hospital Stay: Payer: Medicaid Other

## 2023-10-26 ENCOUNTER — Inpatient Hospital Stay: Payer: Medicaid Other | Admitting: Hematology

## 2023-10-26 DIAGNOSIS — J101 Influenza due to other identified influenza virus with other respiratory manifestations: Secondary | ICD-10-CM | POA: Diagnosis not present

## 2023-10-26 DIAGNOSIS — C538 Malignant neoplasm of overlapping sites of cervix uteri: Secondary | ICD-10-CM

## 2023-10-26 DIAGNOSIS — E872 Acidosis, unspecified: Secondary | ICD-10-CM | POA: Insufficient documentation

## 2023-10-26 DIAGNOSIS — E871 Hypo-osmolality and hyponatremia: Secondary | ICD-10-CM | POA: Insufficient documentation

## 2023-10-26 DIAGNOSIS — J9601 Acute respiratory failure with hypoxia: Secondary | ICD-10-CM | POA: Insufficient documentation

## 2023-10-26 DIAGNOSIS — Z95828 Presence of other vascular implants and grafts: Secondary | ICD-10-CM

## 2023-10-26 DIAGNOSIS — E46 Unspecified protein-calorie malnutrition: Secondary | ICD-10-CM | POA: Insufficient documentation

## 2023-10-26 LAB — RESPIRATORY PANEL BY PCR

## 2023-10-26 LAB — CBC
HCT: 31.6 % — ABNORMAL LOW (ref 36.0–46.0)
Hemoglobin: 10.6 g/dL — ABNORMAL LOW (ref 12.0–15.0)
MCH: 32.6 pg (ref 26.0–34.0)
MCHC: 33.5 g/dL (ref 30.0–36.0)
MCV: 97.2 fL (ref 80.0–100.0)
Platelets: 242 10*3/uL (ref 150–400)
RBC: 3.25 MIL/uL — ABNORMAL LOW (ref 3.87–5.11)
RDW: 15.1 % (ref 11.5–15.5)
WBC: 5.9 10*3/uL (ref 4.0–10.5)
nRBC: 0 % (ref 0.0–0.2)

## 2023-10-26 LAB — COMPREHENSIVE METABOLIC PANEL
ALT: 32 U/L (ref 0–44)
AST: 33 U/L (ref 15–41)
Albumin: 2.5 g/dL — ABNORMAL LOW (ref 3.5–5.0)
Alkaline Phosphatase: 59 U/L (ref 38–126)
Anion gap: 11 (ref 5–15)
BUN: 39 mg/dL — ABNORMAL HIGH (ref 6–20)
CO2: 21 mmol/L — ABNORMAL LOW (ref 22–32)
Calcium: 7.9 mg/dL — ABNORMAL LOW (ref 8.9–10.3)
Chloride: 96 mmol/L — ABNORMAL LOW (ref 98–111)
Creatinine, Ser: 1.87 mg/dL — ABNORMAL HIGH (ref 0.44–1.00)
GFR, Estimated: 32 mL/min — ABNORMAL LOW (ref >60)
Glucose, Bld: 107 mg/dL — ABNORMAL HIGH (ref 70–99)
Potassium: 4.1 mmol/L (ref 3.5–5.1)
Sodium: 128 mmol/L — ABNORMAL LOW (ref 135–145)
Total Bilirubin: 0.7 mg/dL (ref 0.0–1.2)
Total Protein: 6.7 g/dL (ref 6.5–8.1)

## 2023-10-26 LAB — HIV ANTIBODY (ROUTINE TESTING W REFLEX): HIV Screen 4th Generation wRfx: NONREACTIVE

## 2023-10-26 LAB — LACTIC ACID, PLASMA: Lactic Acid, Venous: 1.5 mmol/L (ref 0.5–1.9)

## 2023-10-26 LAB — SODIUM, URINE, RANDOM: Sodium, Ur: <10 mmol/L

## 2023-10-26 LAB — MRSA NEXT GEN BY PCR, NASAL: MRSA by PCR Next Gen: NOT DETECTED

## 2023-10-26 LAB — OSMOLALITY, URINE: Osmolality, Ur: 248 mosm/kg — ABNORMAL LOW (ref 300–900)

## 2023-10-26 LAB — MAGNESIUM: Magnesium: 1.6 mg/dL — ABNORMAL LOW (ref 1.7–2.4)

## 2023-10-26 LAB — PHOSPHORUS: Phosphorus: 3.8 mg/dL (ref 2.5–4.6)

## 2023-10-26 LAB — OSMOLALITY: Osmolality: 294 mosm/kg (ref 275–295)

## 2023-10-26 MED ORDER — OXYCODONE HCL 5 MG PO TABS
5.0000 mg | ORAL_TABLET | Freq: Four times a day (QID) | ORAL | Status: DC | PRN
  Administered 2023-10-26 – 2023-10-28 (×4): 5 mg via ORAL
  Filled 2023-10-26 (×4): qty 1

## 2023-10-26 MED ORDER — ENOXAPARIN SODIUM 30 MG/0.3ML IJ SOSY
30.0000 mg | PREFILLED_SYRINGE | INTRAMUSCULAR | Status: DC
  Administered 2023-10-26: 30 mg via SUBCUTANEOUS
  Filled 2023-10-26: qty 0.3

## 2023-10-26 MED ORDER — PANTOPRAZOLE SODIUM 40 MG PO TBEC
40.0000 mg | DELAYED_RELEASE_TABLET | Freq: Every day | ORAL | Status: DC
  Administered 2023-10-26 – 2023-10-28 (×3): 40 mg via ORAL
  Filled 2023-10-26 (×3): qty 1

## 2023-10-26 MED ORDER — SODIUM CHLORIDE 0.9 % IV SOLN: INTRAVENOUS | Status: AC

## 2023-10-26 MED ORDER — ACETAMINOPHEN 650 MG RE SUPP
650.0000 mg | Freq: Four times a day (QID) | RECTAL | Status: DC | PRN
Start: 2023-10-26 — End: 2023-10-28

## 2023-10-26 MED ORDER — METHYLPREDNISOLONE SODIUM SUCC 40 MG IJ SOLR
40.0000 mg | Freq: Two times a day (BID) | INTRAMUSCULAR | Status: DC
  Administered 2023-10-26 – 2023-10-28 (×5): 40 mg via INTRAVENOUS
  Filled 2023-10-26 (×5): qty 1

## 2023-10-26 MED ORDER — ACETAMINOPHEN 325 MG PO TABS
650.0000 mg | ORAL_TABLET | Freq: Four times a day (QID) | ORAL | Status: DC | PRN
  Administered 2023-10-26: 650 mg via ORAL
  Filled 2023-10-26: qty 2

## 2023-10-26 MED ORDER — ENOXAPARIN SODIUM 40 MG/0.4ML IJ SOSY
40.0000 mg | PREFILLED_SYRINGE | INTRAMUSCULAR | Status: DC
  Administered 2023-10-27 – 2023-10-28 (×2): 40 mg via SUBCUTANEOUS
  Filled 2023-10-26 (×2): qty 0.4

## 2023-10-26 MED ORDER — ONDANSETRON HCL 4 MG PO TABS: 4.0000 mg | ORAL_TABLET | Freq: Four times a day (QID) | ORAL | Status: DC | PRN

## 2023-10-26 MED ORDER — LEVALBUTEROL HCL 0.63 MG/3ML IN NEBU
0.6300 mg | INHALATION_SOLUTION | Freq: Four times a day (QID) | RESPIRATORY_TRACT | Status: DC
  Administered 2023-10-26 – 2023-10-27 (×5): 0.63 mg via RESPIRATORY_TRACT
  Filled 2023-10-26 (×5): qty 3

## 2023-10-26 MED ORDER — LACTATED RINGERS IV SOLN: INTRAVENOUS | Status: DC

## 2023-10-26 MED ORDER — CHLORHEXIDINE GLUCONATE CLOTH 2 % EX PADS
6.0000 | MEDICATED_PAD | Freq: Every day | CUTANEOUS | Status: DC
  Administered 2023-10-26 – 2023-10-28 (×3): 6 via TOPICAL

## 2023-10-26 MED ORDER — ENSURE ENLIVE PO LIQD
237.0000 mL | Freq: Two times a day (BID) | ORAL | Status: DC
  Administered 2023-10-26 – 2023-10-27 (×4): 237 mL via ORAL

## 2023-10-26 MED ORDER — PREGABALIN 50 MG PO CAPS
100.0000 mg | ORAL_CAPSULE | Freq: Two times a day (BID) | ORAL | Status: DC
  Administered 2023-10-26 – 2023-10-28 (×5): 100 mg via ORAL
  Filled 2023-10-26 (×4): qty 2
  Filled 2023-10-26: qty 1

## 2023-10-26 MED ORDER — ONDANSETRON HCL 4 MG/2ML IJ SOLN
4.0000 mg | Freq: Four times a day (QID) | INTRAMUSCULAR | Status: DC | PRN
  Administered 2023-10-26 – 2023-10-28 (×2): 4 mg via INTRAVENOUS
  Filled 2023-10-26 (×2): qty 2

## 2023-10-26 MED ORDER — MAGNESIUM SULFATE 4 GM/100ML IV SOLN
4.0000 g | Freq: Once | INTRAVENOUS | Status: AC
  Administered 2023-10-26: 4 g via INTRAVENOUS
  Filled 2023-10-26: qty 100

## 2023-10-26 MED ORDER — GUAIFENESIN-DM 100-10 MG/5ML PO SYRP: 5.0000 mL | ORAL_SOLUTION | ORAL | Status: DC | PRN

## 2023-10-26 MED ORDER — DM-GUAIFENESIN ER 30-600 MG PO TB12
1.0000 | ORAL_TABLET | Freq: Two times a day (BID) | ORAL | Status: DC
  Administered 2023-10-26 – 2023-10-28 (×5): 1 via ORAL
  Filled 2023-10-26 (×5): qty 1

## 2023-10-26 MED ORDER — MEGESTROL ACETATE 400 MG/10ML PO SUSP
400.0000 mg | Freq: Every day | ORAL | Status: DC
  Administered 2023-10-26 – 2023-10-28 (×3): 400 mg via ORAL
  Filled 2023-10-26 (×3): qty 10

## 2023-10-26 NOTE — Progress Notes (Signed)
   10/26/23 1725  Vitals  Temp (!) 97.5 F (36.4 C)  Temp Source Oral  BP 126/81  MAP (mmHg) 95  BP Location Right Arm  BP Method Automatic  Patient Position (if appropriate) Sitting  Pulse Rate (!) 101  Pulse Rate Source Dinamap  Resp (!) 21  Level of Consciousness  Level of Consciousness Alert  MEWS COLOR  MEWS Score Color Yellow  Oxygen Therapy  SpO2 92 %  O2 Device Room Air  Pain Assessment  Pain Scale 0-10  Pain Score 8  Pain Type Acute pain  Pain Location Foot  Pain Orientation Right;Left  Pain Descriptors / Indicators Aching;Throbbing  Pain Frequency Constant  Pain Onset On-going  Pain Intervention(s) RN made aware  MEWS Score  MEWS Temp 0  MEWS Systolic 0  MEWS Pulse 1  MEWS RR 1  MEWS LOC 0  MEWS Score 2  Provider Notification  Provider Name/Title Dr Marisa Severin  Date Provider Notified 10/27/23  Time Provider Notified 1743  Method of Notification Page (b/p 126/81,hr 101)  Notification Reason Other (Comment) (vital signs heart rate  101)  Test performed and critical result vital signs  Date Critical Result Received 10/26/23  Time Critical Result Received 1725  Provider response No new orders  Date of Provider Response 10/26/23  Time of Provider Response 1743

## 2023-10-26 NOTE — Plan of Care (Signed)
  Problem: Education: Goal: Knowledge of General Education information will improve Description: Including pain rating scale, medication(s)/side effects and non-pharmacologic comfort measures Outcome: Progressing   Problem: Clinical Measurements: Goal: Ability to maintain clinical measurements within normal limits will improve Outcome: Progressing Goal: Respiratory complications will improve Outcome: Progressing   Problem: Nutrition: Goal: Adequate nutrition will be maintained Outcome: Progressing   Problem: Coping: Goal: Level of anxiety will decrease Outcome: Progressing   Problem: Elimination: Goal: Will not experience complications related to urinary retention Outcome: Progressing   Problem: Safety: Goal: Ability to remain free from injury will improve Outcome: Progressing

## 2023-10-26 NOTE — TOC Initial Note (Signed)
 Transition of Care [facility]) - Initial/Assessment Note    Patient Details  Name: Kristin Carson MRN: [id number] Date of Birth: 04/16/1971  [facility] CM/SW Contact:    Karn Cassis, LCSW Phone Number: 10/26/2023, 8:09 AM  Clinical Narrative:  Pt admitted for influenza A. Assessment completed due to high risk readmission score. Pt reports she lives with her husband and is fairly independent with ADLs. She uses [employer] for appointments. Pt plans to return home when medically stable. Pt currently requiring O2. TOC will monitor for any home O2 needs.                  Expected Discharge Plan: Home/Self Care Barriers to Discharge: Continued Medical Work up   Patient Goals and CMS Choice Patient states their goals for this hospitalization and ongoing recovery are:: return home   Choice offered to / list presented to : Patient [place] ownership interest in [facility].provided to::  (n/a)    Expected Discharge Plan and Services In-house Referral: Clinical Social Work     Living arrangements for the past 2 months: Single Family Home                                      Prior Living Arrangements/Services Living arrangements for the past 2 months: Single Family Home Lives with:: Spouse Patient language and need for interpreter reviewed:: Yes Do you feel safe going back to the place where you live?: Yes      Need for Family Participation in Patient Care: No (Comment)     Criminal Activity/Legal Involvement Pertinent to Current Situation/Hospitalization: No - Comment as needed  Activities of Daily Living   ADL Screening (condition at time of admission) Independently performs ADLs?: Yes (appropriate for developmental age) Is the patient deaf or have difficulty hearing?: No Does the patient have difficulty seeing, even when wearing glasses/contacts?: No Does the patient have difficulty concentrating, remembering, or making  decisions?: No  Permission Sought/Granted                  Emotional Assessment     Affect (typically observed): Appropriate Orientation: : Oriented to Self, Oriented to Place, Oriented to  Time, Oriented to Situation Alcohol / Substance Use: Not Applicable Psych Involvement: No (comment)  Admission diagnosis:  Influenza A [J10.1] COPD exacerbation (HCC) [J44.1] Patient Active Problem List   Diagnosis Date Noted   Acute respiratory failure with hypoxia (HCC) 10/26/2023   Hyponatremia 10/26/2023   Hypoalbuminemia due to protein-calorie malnutrition (HCC) 10/26/2023   Lactic acidosis 10/26/2023   Influenza A 10/25/2023   Uterine cancer (HCC) 10/15/2023   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   GERD (gastroesophageal reflux disease) 11/18/2022   Dermatitis 11/18/2022   MDD (major depressive disorder) 11/18/2022   Encounter for general adult medical examination with abnormal findings 11/18/2022   Iron deficiency anemia due to chronic blood loss 10/02/2022   Port-A-Cath in place 09/25/2022   Cervical cancer (HCC) 09/19/2022   Hypokalemia 08/30/2022   AKI (acute kidney injury) (HCC) 08/28/2022   Volume depletion 08/28/2022   Diarrhea 08/28/2022   COVID-19 virus infection 08/28/2022   Abdominal mass 08/28/2022   Essential hypertension 12/10/2016   Chronic obstructive pulmonary disease (HCC) 12/10/2016   Cigarette nicotine dependence with nicotine-induced disorder 12/10/2016   Chronic kidney disease 12/10/2016   SOB (shortness of breath) 04/07/2013   Migraine 04/07/2013  PCP:  Billie Lade, MD Pharmacy:   [facility] [address] [place] - [address] [place] [id number] [place] [address] [place] [address] Phone: [phone] Fax: [phone]     Social Drivers of Health (SDOH) Social History: SDOH Screenings   Food Insecurity: No Food Insecurity (10/26/2023)  Housing: Low Risk  (10/26/2023)  Transportation Needs: No Transportation Needs  (10/26/2023)  Utilities: Not At Risk (10/26/2023)  Alcohol Screen: Low Risk  (09/09/2022)  Depression (PHQ2-9): Low Risk  (05/26/2023)  Financial Resource Strain: Low Risk  (09/09/2022)  Physical Activity: Insufficiently Active (09/09/2022)  Social Connections: Socially Isolated (09/09/2022)  Stress: No Stress Concern Present (09/09/2022)  Tobacco Use: High Risk (10/25/2023)   SDOH Interventions:     Readmission Risk Interventions    10/26/2023    8:07 AM 08/29/2022    2:18 PM  Readmission Risk Prevention Plan  Transportation Screening Complete Complete  PCP or Specialist Appt within 5-7 Days  Complete  Home Care Screening Complete Complete  Medication Review (RN CM) Complete Complete

## 2023-10-26 NOTE — Progress Notes (Signed)
 PROGRESS NOTE  Kristin Carson, is a 53 y.o. female, DOB - 29-Dec-1970, ZOX:[id number]  Admit date - 10/25/2023   Admitting Physician Frankey Shown, DO  Outpatient Primary MD for the patient is Billie Lade, MD  LOS - 1  Chief Complaint  Patient presents with   Shortness of Breath      Brief Narrative:   53 y.o. 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, peritoneum and liver, hypertension, GERD, COPD and ongoing tobacco abuse admitted on 10/25/2023 with acute hypoxic respiratory failure and AKI in the setting of influenza A infection    -Assessment and Plan: 1)AKI----acute kidney injury ---suspect due to dehydration in the setting of influenza A infection and poor oral intake -Creatinine was up to 2.50 on admission baseline usually around 0.9  to 1.1  -Creatinine trending down with hydration --Continue IV fluids -Encourage adequate oral intake - renally adjust medications, avoid nephrotoxic agents / dehydration  / hypotension  2) influenza A infection--- out of the window for Tamiflu -Patient is immunocompromise due to metastatic cervical cancer -Continue IV Solu-Medrol and bronchodilators and  mucolytic's  3) acute hypoxic respiratory failure--- due to 2 above in the setting of underlying pulmonary metastasis and ongoing tobacco abuse currently on 2 L of oxygen via nasal cannula -- Continue to wean off oxygen as able  4) acute COPD exacerbation/ongoing tobacco abuse--- due to #2 above -Management as above #2 with steroids bronchodilators and mucolytics -Smoking cessation advised  5)GERD--continue Protonix especially while on steroids  6)HTN--blood pressure is soft, hold amlodipine -- Continue IV fluids  7) stage IV metastatic cervical cancer with mets to the lungs liver and peritoneum -- Give Megace for appetite stimulation, Lyrica for pain -- Outpatient follow-up with Dr. Ellin Saba advised  8) acute anemia--- hemodilution related  from IV fluids  -No evidence of any overt bleeding at this time, continue to monitor  9) acute hyponatremia--- dehydration related, continue to hydrate  10)Hypomagnesemia----replace and recheck  Status is: Inpatient   Disposition: The patient is from: Home              Anticipated d/c is to: Home              Anticipated d/c date is: 1 day              Patient currently is not medically stable to d/c. Barriers: Not Clinically Stable-   Code Status :  -  Code Status: Full Code   Family Communication:    (patient is alert, awake and coherent)  Discussed with Husband at bedside  DVT Prophylaxis  :   - SCDs   enoxaparin (LOVENOX) injection 30 mg Start: 10/26/23 1000 SCDs Start: 10/26/23 0201   Lab Results  Component Value Date   PLT 242 10/26/2023    Inpatient Medications  Scheduled Meds:  Chlorhexidine Gluconate Cloth  6 each Topical Q0600   dextromethorphan-guaiFENesin  1 tablet Oral BID   enoxaparin (LOVENOX) injection  30 mg Subcutaneous Q24H   feeding supplement  237 mL Oral BID BM   levalbuterol  0.63 mg Nebulization Q6H   methylPREDNISolone (SOLU-MEDROL) injection  40 mg Intravenous Q12H   pantoprazole  40 mg Oral Daily   Continuous Infusions:  sodium chloride     PRN Meds:.acetaminophen **OR** acetaminophen, guaiFENesin-dextromethorphan, ondansetron **OR** ondansetron (ZOFRAN) IV   Anti-infectives (From admission, onward)    Start     Dose/Rate Route Frequency Ordered Stop   10/25/23 2215  levofloxacin (LEVAQUIN) IVPB  750 mg        750 mg 100 mL/hr over 90 Minutes Intravenous  Once 10/25/23 2211 10/26/23 0028       Subjective: Kristin Carson today has no fevers, no emesis,  No chest pain,   - Husband at bedside, questions answered -Voiding okay -Cough and dyspnea persist -Oxygen requirement improving -Appetite is not great  Objective: Vitals:   10/26/23 0751 10/26/23 0800 10/26/23 0832 10/26/23 0900  BP:  92/65    Pulse:  (!) 111  88  Resp:  (!)  29  18  Temp: (!) 97.5 F (36.4 C)     TempSrc: Oral     SpO2:   100% 99%  Weight:      Height:        Intake/Output Summary (Last 24 hours) at 10/26/2023 1009 Last data filed at 10/26/2023 0600 Gross per 24 hour  Intake 410.57 ml  Output --  Net 410.57 ml   Filed Weights   10/25/23 2150 10/26/23 0042  Weight: 75 kg 70.4 kg    Physical Exam  Gen:- Awake Alert, no acute distress HEENT:- [place].AT, No sclera icterus Neck-Supple Neck,No JVD,.  Lungs-diminished breath sounds with scattered wheezes  CV- S1, S2 normal, regular  Abd-  +ve B.Sounds, Abd Soft, No tenderness, no CVA area tenderness Extremity/Skin:- No  edema, pedal pulses present  Psych-affect is appropriate, oriented x3 Neuro-no new focal deficits, no tremors  Data Reviewed: I have personally reviewed following labs and imaging studies  CBC: Recent Labs  Lab 10/25/23 2156 10/26/23 0342  WBC 6.7 5.9  HGB 12.5 10.6*  HCT 35.6* 31.6*  MCV 95.7 97.2  PLT 292 242   Basic Metabolic Panel: Recent Labs  Lab 10/25/23 2156 10/26/23 0342  NA 131* 128*  K 3.7 4.1  CL 91* 96*  CO2 22 21*  GLUCOSE 112* 107*  BUN 44* 39*  CREATININE 2.50* 1.87*  CALCIUM 8.9 7.9*  MG  --  1.6*  PHOS  --  3.8   GFR: Estimated Creatinine Clearance: 33.7 mL/min (A) (by C-G formula based on SCr of 1.87 mg/dL (H)). Liver Function Tests: Recent Labs  Lab 10/25/23 2156 10/26/23 0342  AST 40 33  ALT 38 32  ALKPHOS 75 59  BILITOT 0.8 0.7  PROT 7.7 6.7  ALBUMIN 3.0* 2.5*    Recent Results (from the past 240 hours)  Resp panel by RT-PCR (RSV, Flu A&B, Covid) Anterior Nasal Swab     Status: Abnormal   Collection Time: 10/25/23  9:56 PM   Specimen: Anterior Nasal Swab  Result Value Ref Range Status   SARS Coronavirus 2 by RT PCR NEGATIVE NEGATIVE Final    Comment: (NOTE) SARS-CoV-2 target nucleic acids are NOT DETECTED.  The SARS-CoV-2 RNA is generally detectable in upper respiratory specimens during the acute phase of  infection. The lowest concentration of SARS-CoV-2 viral copies this assay can detect is 138 copies/mL. A negative result does not preclude SARS-Cov-2 infection and should not be used as the sole basis for treatment or other patient management decisions. A negative result may occur with  improper specimen collection/handling, submission of specimen other than nasopharyngeal swab, presence of viral mutation(s) within the areas targeted by this assay, and inadequate number of viral copies(<138 copies/mL). A negative result must be combined with clinical observations, patient history, and epidemiological information. The expected result is Negative.  Fact Sheet for Patients:  BloggerCourse.com  Fact Sheet for Healthcare Providers:  SeriousBroker.it  This test is no t yet  approved or cleared by the [place] and  has been authorized for detection and/or diagnosis of SARS-CoV-2 by FDA under an Emergency Use Authorization (EUA). This EUA will remain  in effect (meaning this test can be used) for the duration of the COVID-19 declaration under Section 564(b)(1) of the Act, 21 U.S.C.section 360bbb-3(b)(1), unless the authorization is terminated  or revoked sooner.       Influenza A by PCR POSITIVE (A) NEGATIVE Final   Influenza B by PCR NEGATIVE NEGATIVE Final    Comment: (NOTE) The Xpert Xpress SARS-CoV-2/FLU/RSV plus assay is intended as an aid in the diagnosis of influenza from Nasopharyngeal swab specimens and should not be used as a sole basis for treatment. Nasal washings and aspirates are unacceptable for Xpert Xpress SARS-CoV-2/FLU/RSV testing.  Fact Sheet for Patients: BloggerCourse.com  Fact Sheet for Healthcare Providers: SeriousBroker.it  This test is not yet approved or cleared by the [place] FDA and has been authorized for detection and/or diagnosis of  SARS-CoV-2 by FDA under an Emergency Use Authorization (EUA). This EUA will remain in effect (meaning this test can be used) for the duration of the COVID-19 declaration under Section 564(b)(1) of the Act, 21 U.S.C. section 360bbb-3(b)(1), unless the authorization is terminated or revoked.     Resp Syncytial Virus by PCR NEGATIVE NEGATIVE Final    Comment: (NOTE) Fact Sheet for Patients: BloggerCourse.com  Fact Sheet for Healthcare Providers: SeriousBroker.it  This test is not yet approved or cleared by the [place] FDA and has been authorized for detection and/or diagnosis of SARS-CoV-2 by FDA under an Emergency Use Authorization (EUA). This EUA will remain in effect (meaning this test can be used) for the duration of the COVID-19 declaration under Section 564(b)(1) of the Act, 21 U.S.C. section 360bbb-3(b)(1), unless the authorization is terminated or revoked.  Performed at [facility], [address] [place], [name]   MRSA Next Gen by PCR, Nasal     Status: None   Collection Time: 10/26/23 12:52 AM   Specimen: Nasal Mucosa; Nasal Swab  Result Value Ref Range Status   MRSA by PCR Next Gen NOT DETECTED NOT DETECTED Final    Comment: (NOTE) The GeneXpert MRSA Assay (FDA approved for NASAL specimens only), is one component of a comprehensive MRSA colonization surveillance program. It is not intended to diagnose MRSA infection nor to guide or monitor treatment for MRSA infections. Test performance is not FDA approved in patients less than 29 years old. Performed at [facility], [address] [place], [place] [address]     Radiology Studies: DG Chest Portable 1 View Result Date: 10/25/2023 CLINICAL DATA:  Difficulty breathing, flu like symptoms EXAM: PORTABLE CHEST 1 VIEW COMPARISON:  08/29/2022.  Chest CT 10/19/2023 FINDINGS: Pulmonary nodules are noted in the left upper lobe and left lower lung. Nodular  densities noted in the right mid lung and right lung base. These appear new and/or enlarged since recent CT. No effusions. Heart and mediastinal contours within normal limits. Right Port-A-Cath is unchanged since prior CT. IMPRESSION: Bilateral pulmonary nodules/metastases, some of which appear enlarged since recent CT. Electronically Signed   By: Charlett Nose M.D.   On: 10/25/2023 22:15   Scheduled Meds:  Chlorhexidine Gluconate Cloth  6 each Topical Q0600   dextromethorphan-guaiFENesin  1 tablet Oral BID   enoxaparin (LOVENOX) injection  30 mg Subcutaneous Q24H   feeding supplement  237 mL Oral BID BM   levalbuterol  0.63 mg Nebulization Q6H  methylPREDNISolone (SOLU-MEDROL) injection  40 mg Intravenous Q12H   pantoprazole  40 mg Oral Daily   Continuous Infusions:  sodium chloride      LOS: 1 day   Shon Hale M.D on 10/26/2023 at 10:09 AM  Go to [email or web address] - for contact info  [facility] - Office  [phone]  If 7PM-7AM, please contact night-coverage [email or web address] 10/26/2023, 10:09 AM

## 2023-10-26 NOTE — Plan of Care (Signed)
   Problem: Activity: Goal: Risk for activity intolerance will decrease Outcome: Progressing   Problem: Coping: Goal: Level of anxiety will decrease Outcome: Progressing   Problem: Pain Managment: Goal: General experience of comfort will improve and/or be controlled Outcome: Progressing

## 2023-10-27 DIAGNOSIS — J101 Influenza due to other identified influenza virus with other respiratory manifestations: Secondary | ICD-10-CM | POA: Diagnosis not present

## 2023-10-27 LAB — BASIC METABOLIC PANEL
Anion gap: 7 (ref 5–15)
BUN: 37 mg/dL — ABNORMAL HIGH (ref 6–20)
CO2: 25 mmol/L (ref 22–32)
Calcium: 8.5 mg/dL — ABNORMAL LOW (ref 8.9–10.3)
Chloride: 100 mmol/L (ref 98–111)
Creatinine, Ser: 1.5 mg/dL — ABNORMAL HIGH (ref 0.44–1.00)
GFR, Estimated: 42 mL/min — ABNORMAL LOW (ref >60)
Glucose, Bld: 126 mg/dL — ABNORMAL HIGH (ref 70–99)
Potassium: 4.3 mmol/L (ref 3.5–5.1)
Sodium: 132 mmol/L — ABNORMAL LOW (ref 135–145)

## 2023-10-27 MED ORDER — LEVALBUTEROL HCL 0.63 MG/3ML IN NEBU
0.6300 mg | INHALATION_SOLUTION | Freq: Three times a day (TID) | RESPIRATORY_TRACT | Status: DC
  Administered 2023-10-27 – 2023-10-28 (×5): 0.63 mg via RESPIRATORY_TRACT
  Filled 2023-10-27 (×5): qty 3

## 2023-10-27 MED ORDER — SODIUM CHLORIDE 0.9 % IV SOLN: INTRAVENOUS | Status: AC

## 2023-10-27 NOTE — Progress Notes (Signed)
 Nurse at bedside,patient alert,and oriented times four.Patient stated " I feel short of breath".Reached out to Respiratory for support for breathing treatment.Patient encouraged to cough and try to get up the secretions.Patient did c/o pain to her foot bilateral rated a 7 achy,discomfort,constant pain.Plan of care on going.

## 2023-10-27 NOTE — Progress Notes (Signed)
 PROGRESS NOTE  Kristin Carson, is a 53 y.o. female, DOB - May 04, 1971, ZOX:[id number]  Admit date - 10/25/2023   Admitting Physician Frankey Shown, DO  Outpatient Primary MD for the patient is Billie Lade, MD  LOS - 2  Chief Complaint  Patient presents with   Shortness of Breath      Brief Narrative:   53 y.o. 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, peritoneum and liver, hypertension, GERD, COPD and ongoing tobacco abuse admitted on 10/25/2023 with acute hypoxic respiratory failure and AKI in the setting of influenza A infection    -Assessment and Plan: 1)AKI----acute kidney injury ---suspect due to dehydration in the setting of influenza A infection and poor oral intake -Creatinine was up to 2.50 on admission baseline usually around 0.9  to 1.1  -Creatinine trending down with hydration (continues down to 1.50) --Continue IV fluids -Encourage adequate oral intake - renally adjust medications, avoid nephrotoxic agents / dehydration  / hypotension  2)Influenza A infection--- out of the window for Tamiflu -Patient is immunocompromise due to metastatic cervical cancer -Continue IV Solu-Medrol and bronchodilators and  mucolytic's  3) acute hypoxic respiratory failure--- due to 2 above in the setting of underlying pulmonary metastasis and ongoing tobacco abuse currently on 2 L of oxygen via nasal cannula -- Continue to wean off oxygen as able--see subjective section below  4) acute COPD exacerbation/ongoing tobacco abuse--- due to #2 above -Management as above #2 with steroids bronchodilators and mucolytics -Smoking cessation advised  5)GERD--continue Protonix especially while on steroids  6)HTN--blood pressure is soft, hold amlodipine -- Continue IV fluids  7) stage IV metastatic cervical cancer with mets to the lungs liver and peritoneum C/n  Lyrica for pain -- Outpatient follow-up with Dr. Ellin Saba advised  8) acute anemia---  hemodilution related to IV fluids  -No evidence of any overt bleeding at this time, continue to monitor  9) acute hyponatremia--- dehydration related, -Sodium is up to 132 from 128 continue to hydrate  10)Hypomagnesemia----replaced    Status is: Inpatient   Disposition: The patient is from: Home              Anticipated d/c is to: Home              Anticipated d/c date is: 1 day              Patient currently is not medically stable to d/c. Barriers: Not Clinically Stable-   Code Status :  -  Code Status: Full Code   Family Communication:    (patient is alert, awake and coherent)  Discussed with Husband at bedside  DVT Prophylaxis  :   - SCDs   enoxaparin (LOVENOX) injection 40 mg Start: 10/27/23 1000 SCDs Start: 10/26/23 0201   Lab Results  Component Value Date   PLT 242 10/26/2023    Inpatient Medications  Scheduled Meds:  Chlorhexidine Gluconate Cloth  6 each Topical Q0600   dextromethorphan-guaiFENesin  1 tablet Oral BID   enoxaparin (LOVENOX) injection  40 mg Subcutaneous Q24H   feeding supplement  237 mL Oral BID BM   levalbuterol  0.63 mg Nebulization TID   megestrol  400 mg Oral Daily   methylPREDNISolone (SOLU-MEDROL) injection  40 mg Intravenous Q12H   pantoprazole  40 mg Oral Daily   pregabalin  100 mg Oral BID   Continuous Infusions:   PRN Meds:.acetaminophen **OR** acetaminophen, guaiFENesin-dextromethorphan, ondansetron **OR** ondansetron (ZOFRAN) IV, oxyCODONE   Anti-infectives (From admission, onward)  Start     Dose/Rate Route Frequency Ordered Stop   10/25/23 2215  levofloxacin (LEVAQUIN) IVPB 750 mg        750 mg 100 mL/hr over 90 Minutes Intravenous  Once 10/25/23 2211 10/26/23 1949       Subjective: Kristin Carson today has no fevers, no emesis,  No chest pain,   - Husband at bedside, questions answered Patient ambulated with mobility specialist 88-91% on RA throughout session. Pt c/o SOB, and audible wheezes. C/o lightheadedness, HR  ranged 115-123 bpm throughout session    Objective: Vitals:   10/27/23 0602 10/27/23 0733 10/27/23 0822 10/27/23 1335  BP: 115/81  136/84 125/76  Pulse: 94  (!) 106 90  Resp: 19     Temp: 97.6 F (36.4 C)  97.6 F (36.4 C) 98 F (36.7 C)  TempSrc: Oral  Oral Oral  SpO2: 90% 91% 99% 97%  Weight:      Height:        Intake/Output Summary (Last 24 hours) at 10/27/2023 1851 Last data filed at 10/27/2023 1300 Gross per 24 hour  Intake 1869.17 ml  Output --  Net 1869.17 ml   Filed Weights   10/25/23 2150 10/26/23 0042  Weight: 75 kg 70.4 kg    Physical Exam Gen:- Awake Alert, no acute distress HEENT:- [place].AT, No sclera icterus Nose- 2L/min Neck-Supple Neck,No JVD,.  Lungs-diminished breath sounds with scattered wheezes  CV- S1, S2 normal, regular  Abd-  +ve B.Sounds, Abd Soft, No tenderness, no CVA area tenderness Extremity/Skin:- No  edema, pedal pulses present  Psych-affect is appropriate, oriented x3 Neuro-generalized weakness, no new focal deficits, no tremors  Data Reviewed: I have personally reviewed following labs and imaging studies  CBC: Recent Labs  Lab 10/25/23 2156 10/26/23 0342  WBC 6.7 5.9  HGB 12.5 10.6*  HCT 35.6* 31.6*  MCV 95.7 97.2  PLT 292 242   Basic Metabolic Panel: Recent Labs  Lab 10/25/23 2156 10/26/23 0342 10/27/23 0406  NA 131* 128* 132*  K 3.7 4.1 4.3  CL 91* 96* 100  CO2 22 21* 25  GLUCOSE 112* 107* 126*  BUN 44* 39* 37*  CREATININE 2.50* 1.87* 1.50*  CALCIUM 8.9 7.9* 8.5*  MG  --  1.6*  --   PHOS  --  3.8  --    GFR: Estimated Creatinine Clearance: 42 mL/min (A) (by C-G formula based on SCr of 1.5 mg/dL (H)). Liver Function Tests: Recent Labs  Lab 10/25/23 2156 10/26/23 0342  AST 40 33  ALT 38 32  ALKPHOS 75 59  BILITOT 0.8 0.7  PROT 7.7 6.7  ALBUMIN 3.0* 2.5*    Recent Results (from the past 240 hours)  Resp panel by RT-PCR (RSV, Flu A&B, Covid) Anterior Nasal Swab     Status: Abnormal   Collection Time:  10/25/23  9:56 PM   Specimen: Anterior Nasal Swab  Result Value Ref Range Status   SARS Coronavirus 2 by RT PCR NEGATIVE NEGATIVE Final    Comment: (NOTE) SARS-CoV-2 target nucleic acids are NOT DETECTED.  The SARS-CoV-2 RNA is generally detectable in upper respiratory specimens during the acute phase of infection. The lowest concentration of SARS-CoV-2 viral copies this assay can detect is 138 copies/mL. A negative result does not preclude SARS-Cov-2 infection and should not be used as the sole basis for treatment or other patient management decisions. A negative result may occur with  improper specimen collection/handling, submission of specimen other than nasopharyngeal swab, presence of viral mutation(s) within  the areas targeted by this assay, and inadequate number of viral copies(<138 copies/mL). A negative result must be combined with clinical observations, patient history, and epidemiological information. The expected result is Negative.  Fact Sheet for Patients:  BloggerCourse.com  Fact Sheet for Healthcare Providers:  SeriousBroker.it  This test is no t yet approved or cleared by the [place] FDA and  has been authorized for detection and/or diagnosis of SARS-CoV-2 by FDA under an Emergency Use Authorization (EUA). This EUA will remain  in effect (meaning this test can be used) for the duration of the COVID-19 declaration under Section 564(b)(1) of the Act, 21 U.S.C.section 360bbb-3(b)(1), unless the authorization is terminated  or revoked sooner.       Influenza A by PCR POSITIVE (A) NEGATIVE Final   Influenza B by PCR NEGATIVE NEGATIVE Final    Comment: (NOTE) The Xpert Xpress SARS-CoV-2/FLU/RSV plus assay is intended as an aid in the diagnosis of influenza from Nasopharyngeal swab specimens and should not be used as a sole basis for treatment. Nasal washings and aspirates are unacceptable for Xpert Xpress  SARS-CoV-2/FLU/RSV testing.  Fact Sheet for Patients: BloggerCourse.com  Fact Sheet for Healthcare Providers: SeriousBroker.it  This test is not yet approved or cleared by the [place] FDA and has been authorized for detection and/or diagnosis of SARS-CoV-2 by FDA under an Emergency Use Authorization (EUA). This EUA will remain in effect (meaning this test can be used) for the duration of the COVID-19 declaration under Section 564(b)(1) of the Act, 21 U.S.C. section 360bbb-3(b)(1), unless the authorization is terminated or revoked.     Resp Syncytial Virus by PCR NEGATIVE NEGATIVE Final    Comment: (NOTE) Fact Sheet for Patients: BloggerCourse.com  Fact Sheet for Healthcare Providers: SeriousBroker.it  This test is not yet approved or cleared by the [place] FDA and has been authorized for detection and/or diagnosis of SARS-CoV-2 by FDA under an Emergency Use Authorization (EUA). This EUA will remain in effect (meaning this test can be used) for the duration of the COVID-19 declaration under Section 564(b)(1) of the Act, 21 U.S.C. section 360bbb-3(b)(1), unless the authorization is terminated or revoked.  Performed at [facility], [address], [place], [name]   Blood Culture (routine x 2)     Status: None (Preliminary result)   Collection Time: 10/25/23 10:45 PM   Specimen: BLOOD  Result Value Ref Range Status   Specimen Description BLOOD BLOOD RIGHT ARM  Final   Special Requests NONE  Final   Culture   Final    NO GROWTH 2 DAYS Performed at [facility], [address], [place], [place] [address]    Report Status PENDING  Incomplete  Blood Culture (routine x 2)     Status: None (Preliminary result)   Collection Time: 10/25/23 10:47 PM   Specimen: BLOOD  Result Value Ref Range Status   Specimen Description BLOOD BLOOD RIGHT HAND  Final   Special  Requests NONE  Final   Culture   Final    NO GROWTH 2 DAYS Performed at [facility], [address] [place], [place] [address]    Report Status PENDING  Incomplete  MRSA Next Gen by PCR, Nasal     Status: None   Collection Time: 10/26/23 12:52 AM   Specimen: Nasal Mucosa; Nasal Swab  Result Value Ref Range Status   MRSA by PCR Next Gen NOT DETECTED NOT DETECTED Final    Comment: (NOTE) The GeneXpert MRSA Assay (FDA approved for NASAL  specimens only), is one component of a comprehensive MRSA colonization surveillance program. It is not intended to diagnose MRSA infection nor to guide or monitor treatment for MRSA infections. Test performance is not FDA approved in patients less than 70 years old. Performed at [facility], [address], [place], [place] [address]   Respiratory (~20 pathogens) panel by PCR     Status: Abnormal   Collection Time: 10/26/23  2:06 PM   Specimen: Nasopharyngeal Swab; Respiratory  Result Value Ref Range Status   Adenovirus NOT DETECTED NOT DETECTED Final   Coronavirus 229E NOT DETECTED NOT DETECTED Final    Comment: (NOTE) The Coronavirus on the Respiratory Panel, DOES NOT test for the novel  Coronavirus (2019 nCoV)    Coronavirus HKU1 NOT DETECTED NOT DETECTED Final   Coronavirus NL63 NOT DETECTED NOT DETECTED Final   Coronavirus OC43 NOT DETECTED NOT DETECTED Final   Metapneumovirus NOT DETECTED NOT DETECTED Final   Rhinovirus / Enterovirus NOT DETECTED NOT DETECTED Final   Influenza A H1 2009 DETECTED (A) NOT DETECTED Final   Influenza B NOT DETECTED NOT DETECTED Final   Parainfluenza Virus 1 NOT DETECTED NOT DETECTED Final   Parainfluenza Virus 2 NOT DETECTED NOT DETECTED Final   Parainfluenza Virus 3 NOT DETECTED NOT DETECTED Final   Parainfluenza Virus 4 NOT DETECTED NOT DETECTED Final   Respiratory Syncytial Virus NOT DETECTED NOT DETECTED Final   Bordetella pertussis NOT DETECTED NOT DETECTED Final   Bordetella Parapertussis NOT DETECTED  NOT DETECTED Final   Chlamydophila pneumoniae NOT DETECTED NOT DETECTED Final   Mycoplasma pneumoniae NOT DETECTED NOT DETECTED Final    Comment: Performed at [facility], [address] [place], [place] [address]    Radiology Studies: DG Chest Portable 1 View Result Date: 10/25/2023 CLINICAL DATA:  Difficulty breathing, flu like symptoms EXAM: PORTABLE CHEST 1 VIEW COMPARISON:  08/29/2022.  Chest CT 10/19/2023 FINDINGS: Pulmonary nodules are noted in the left upper lobe and left lower lung. Nodular densities noted in the right mid lung and right lung base. These appear new and/or enlarged since recent CT. No effusions. Heart and mediastinal contours within normal limits. Right Port-A-Cath is unchanged since prior CT. IMPRESSION: Bilateral pulmonary nodules/metastases, some of which appear enlarged since recent CT. Electronically Signed   By: Charlett Nose M.D.   On: 10/25/2023 22:15   Scheduled Meds:  Chlorhexidine Gluconate Cloth  6 each Topical Q0600   dextromethorphan-guaiFENesin  1 tablet Oral BID   enoxaparin (LOVENOX) injection  40 mg Subcutaneous Q24H   feeding supplement  237 mL Oral BID BM   levalbuterol  0.63 mg Nebulization TID   megestrol  400 mg Oral Daily   methylPREDNISolone (SOLU-MEDROL) injection  40 mg Intravenous Q12H   pantoprazole  40 mg Oral Daily   pregabalin  100 mg Oral BID   Continuous Infusions:    LOS: 2 days   Shon Hale M.D on 10/27/2023 at 6:51 PM  Go to [email or web address] - for contact info  [facility] - Office  [phone]  If 7PM-7AM, please contact night-coverage [email or web address] 10/27/2023, 6:51 PM

## 2023-10-27 NOTE — Plan of Care (Signed)
   Problem: Education: Goal: Knowledge of General Education information will improve Description Including pain rating scale, medication(s)/side effects and non-pharmacologic comfort measures Outcome: Progressing   Problem: Education: Goal: Knowledge of General Education information will improve Description Including pain rating scale, medication(s)/side effects and non-pharmacologic comfort measures Outcome: Progressing

## 2023-10-27 NOTE — Progress Notes (Signed)
 Mobility Specialist Progress Note:    10/27/23 1003  Mobility  Activity Ambulated with assistance in hallway  Level of Assistance Standby assist, set-up cues, supervision of patient - no hands on  Assistive Device None  Distance Ambulated (ft) 140 ft  Range of Motion/Exercises Active;All extremities  Activity Response Tolerated well  Mobility Referral Yes  Mobility visit 1 Mobility  Mobility Specialist Start Time (ACUTE ONLY) 0935  Mobility Specialist Stop Time (ACUTE ONLY) 1000  Mobility Specialist Time Calculation (min) (ACUTE ONLY) 25 min   Pt received sitting EOB, agreeable to mobility. Required SBA to stand and ambulate with no AD. Tolerated well, SpO2 fluctuated between 88-91% on RA throughout session. Pt c/o SOB, and audible SOB. C/o lightheadedness, resolved with standing rest breaks and deep breathing. HR ranged 115-123 bpm throughout session. Returned to room, left pt sitting EOB. Notified nurse, all needs met.   Detravion Tester Mobility Specialist Please contact via Special educational needs teacher or  Rehab office at [phone]

## 2023-10-28 DIAGNOSIS — J441 Chronic obstructive pulmonary disease with (acute) exacerbation: Secondary | ICD-10-CM | POA: Diagnosis not present

## 2023-10-28 DIAGNOSIS — J101 Influenza due to other identified influenza virus with other respiratory manifestations: Secondary | ICD-10-CM | POA: Diagnosis not present

## 2023-10-28 DIAGNOSIS — K219 Gastro-esophageal reflux disease without esophagitis: Secondary | ICD-10-CM | POA: Diagnosis not present

## 2023-10-28 DIAGNOSIS — C538 Malignant neoplasm of overlapping sites of cervix uteri: Secondary | ICD-10-CM

## 2023-10-28 DIAGNOSIS — I10 Essential (primary) hypertension: Secondary | ICD-10-CM | POA: Diagnosis not present

## 2023-10-28 LAB — BASIC METABOLIC PANEL
Anion gap: 10 (ref 5–15)
BUN: 32 mg/dL — ABNORMAL HIGH (ref 6–20)
CO2: 22 mmol/L (ref 22–32)
Calcium: 8.3 mg/dL — ABNORMAL LOW (ref 8.9–10.3)
Chloride: 102 mmol/L (ref 98–111)
Creatinine, Ser: 1.21 mg/dL — ABNORMAL HIGH (ref 0.44–1.00)
GFR, Estimated: 55 mL/min — ABNORMAL LOW (ref >60)
Glucose, Bld: 131 mg/dL — ABNORMAL HIGH (ref 70–99)
Potassium: 4 mmol/L (ref 3.5–5.1)
Sodium: 134 mmol/L — ABNORMAL LOW (ref 135–145)

## 2023-10-28 MED ORDER — BUDESONIDE-FORMOTEROL FUMARATE 160-4.5 MCG/ACT IN AERO: 2.0000 | INHALATION_SPRAY | Freq: Two times a day (BID) | RESPIRATORY_TRACT | 3 refills | Status: DC

## 2023-10-28 MED ORDER — ACETAMINOPHEN 500 MG PO TABS: 1000.0000 mg | ORAL_TABLET | Freq: Three times a day (TID) | ORAL | Status: DC | PRN

## 2023-10-28 MED ORDER — CETIRIZINE HCL 10 MG PO TABS: 10.0000 mg | ORAL_TABLET | Freq: Every day | ORAL | 1 refills | Status: DC

## 2023-10-28 MED ORDER — HYDROXYZINE HCL 25 MG PO TABS: 25.0000 mg | ORAL_TABLET | Freq: Once | ORAL | Status: DC

## 2023-10-28 MED ORDER — AMLODIPINE BESYLATE 10 MG PO TABS: 10.0000 mg | ORAL_TABLET | Freq: Every day | ORAL | 3 refills | Status: DC

## 2023-10-28 MED ORDER — LEVALBUTEROL TARTRATE 45 MCG/ACT IN AERO: 2.0000 | INHALATION_SPRAY | Freq: Three times a day (TID) | RESPIRATORY_TRACT | 2 refills | Status: DC | PRN

## 2023-10-28 MED ORDER — PREDNISONE 20 MG PO TABS: ORAL_TABLET | ORAL | 0 refills | Status: DC

## 2023-10-28 MED ORDER — DM-GUAIFENESIN ER 30-600 MG PO TB12: 1.0000 | ORAL_TABLET | Freq: Two times a day (BID) | ORAL | 0 refills | Status: AC

## 2023-10-28 MED ORDER — OXYCODONE HCL 5 MG PO TABS: 5.0000 mg | ORAL_TABLET | Freq: Four times a day (QID) | ORAL | 0 refills | Status: DC | PRN

## 2023-10-28 NOTE — Progress Notes (Signed)
 Nurse at bedside,patient alert and oriented times four. Patient's breathing is better today,oxygen saturation on room air this am was 95 percent. Plan of care on going.

## 2023-10-28 NOTE — Progress Notes (Signed)
Patient discharged home with instructions given on medications and follow up visits,patient and family verbalized understanding. Prescriptions sent to Pharmacy of choice documented on AVS. IV discontinued,catheter intact. Accompanied by staff to an awaiting vehicle. 

## 2023-10-28 NOTE — Discharge Summary (Signed)
 Physician Discharge Summary   Patient: Kristin Carson MRN: [id number] DOB: 1971/05/10  Admit date:     10/25/2023  Discharge date: 10/28/23  Discharge Physician: Vassie Loll   PCP: Billie Lade, MD   Recommendations at discharge:  Repeat basic metabolic panel to follow ultralights renal function Continue assisting patient with tobacco cessation Outpatient follow-up with pulmonology service for PFTs and further decision regarding maintenance therapy recommended. Make sure patient follow-up with oncology service as advised. Reassess blood pressure and further adjust antihypertensive agents.  Discharge Diagnoses: Principal Problem:   Influenza A Active Problems:   Essential hypertension   COPD exacerbation (HCC)   AKI (acute kidney injury) (HCC)   Cervical cancer (HCC)   GERD (gastroesophageal reflux disease)   Acute respiratory failure with hypoxia (HCC)   Hyponatremia   Hypoalbuminemia due to protein-calorie malnutrition (HCC)   Lactic acidosis  Brief Hospital admission narrative: As per H&P written by Dr. Thomes Dinning on 10/25/2023 Kristin Carson is a 53 y.o. 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  She states that she was exposed to husband who had flu.  Shortness of breath worsened today, so she activated EMS and patient was taken to the ED for further evaluation and management.   ED Course:  In emergency department, she was tachypneic, tachycardic and patient was placed on supplemental oxygen via [place] at 6 LPM to maintain O2 sat of 94%.  Workup in the ED showed normal CBC except for hematocrit of 35.6.  BMP showed sodium 131, potassium 3.7, chloride 91, bicarb 22, blood glucose 112, BUN 44, creatinine 2.50, GFR 23, anion  gap 18.  Albumin 3.0, BNP 249, lactic acid 2.4 > 1.5.  Influenza A was positive, influenza B, SARS coronavirus 2, RSV was negative. Chest x-ray showed bilateral pulmonary nodules/metastasis, some of which appear enlarged since recent CT. Patient was initially treated with IV Levaquin due to the presumption that she was having consequent pneumonia.  She was then treated with Dilaudid, breathing treatment, IV Solu-Medrol 25 mg x 1 and Percocet.  IV hydration was provided. Hospitalist was asked to admit patient for further evaluation and management.  Assessment and Plan: 1-influenza A infection/COPD exacerbation -Patient was out of therapeutic window for Tamiflu -Excellent response to the use of his steroids and bronchodilator management -Discharged home with prednisone tapering, bronchodilator management using Symbicort and as needed Xopenex; mucolytic's using Mucinex with dextromethorphan and instruction to follow-up with PCP/pulmonologist in the outpatient setting. -Patient also instructed to quit smoking.  2-tobacco abuse -Nicotine patch declined -Cessation counseling provided.  3-acute kidney injury -At time of admission creatinine up to 2.5; at discharge trending down and approximately 1.5 -Patient advised to maintain adequate hydration -Continue minimizing nephrotoxic agents -Repeat basic metabolic panel at follow-up visit and further adjust medication as needed.  4-acute hypoxic respiratory failure -In the setting of COPD exacerbation and influenza A infection -Condition is stabilized and no oxygen supplementation was required at discharge.  5-hypertension -Stable and rising at discharge -Patient advised to follow heart healthy/low-sodium diet and to resume the use of amlodipine.  6-GERD -Continue PPI.  7-stage IV metastatic cervical cancer/chronic pain -Continue outpatient follow-up with oncology service -Continue treatment with Lyrica and as needed oxycodone (short amount of  oxycodone prescriptions provided at discharge as patient was not  having any medication at home to assist with pain management).   8-hypomagnesemia -Repleted  9-hyponatremia -Improving/resolving with hydration -Continue to follow electrolytes at discharge -Patient advised to maintain adequate nutrition/hydration  Consultants: None Procedures performed: See below for x-ray reports. Disposition: Home Diet recommendation: Heart healthy diet.  DISCHARGE MEDICATION: Allergies as of 10/28/2023       Reactions   Carrot [daucus Carota] Hives   Lisinopril-hydrochlorothiazide    Oral swelling   Ibuprofen Other (See Comments)   Oral swelling   Other Itching, Other (See Comments)   Hair dye, blisters, pus-filled, soreness   Tramadol Hives   Erythromycin Hives        Medication List     STOP taking these medications    albuterol 108 (90 Base) MCG/ACT inhaler Commonly known as: VENTOLIN HFA   amoxicillin-clavulanate 875-125 MG tablet Commonly known as: AUGMENTIN   AVASTIN IV   HYDROcodone-acetaminophen 5-325 MG tablet Commonly known as: NORCO/VICODIN   loperamide 2 MG capsule Commonly known as: IMODIUM   naproxen sodium 220 MG tablet Commonly known as: ALEVE   nicotine polacrilex 4 MG gum Commonly known as: Nicorette   TECENTRIQ IV       TAKE these medications    acetaminophen 500 MG tablet Commonly known as: TYLENOL Take 2 tablets (1,000 mg total) by mouth every 8 (eight) hours as needed for mild pain (pain score 1-3), fever or headache.   amLODipine 10 MG tablet Commonly known as: NORVASC Take 1 tablet (10 mg total) by mouth daily.   budesonide-formoterol 160-4.5 MCG/ACT inhaler Commonly known as: Symbicort Inhale 2 puffs into the lungs in the morning and at bedtime.   cetirizine 10 MG tablet Commonly known as: ZyrTEC Allergy Take 1 tablet (10 mg total) by mouth daily.   dextromethorphan-guaiFENesin 30-600 MG 12hr tablet Commonly known as: MUCINEX  DM Take 1 tablet by mouth 2 (two) times daily for 10 days.   levalbuterol 45 MCG/ACT inhaler Commonly known as: XOPENEX HFA Inhale 2 puffs into the lungs every 8 (eight) hours as needed for wheezing or shortness of breath.   lidocaine-prilocaine cream Commonly known as: EMLA Apply a dime-sized amount to port a cath site and cover with plastic wrap 1 hour prior to infusion appointments   megestrol 400 MG/10ML suspension Commonly known as: MEGACE Take 10 mLs (400 mg total) by mouth 2 (two) times daily.   ondansetron 8 MG tablet Commonly known as: Zofran Take 1 tablet (8 mg total) by mouth every 8 (eight) hours as needed for nausea or vomiting. Start on the third day after cisplatin.   oxyCODONE 5 MG immediate release tablet Commonly known as: Oxy IR/ROXICODONE Take 1 tablet (5 mg total) by mouth every 6 (six) hours as needed for severe pain (pain score 7-10).   pantoprazole 40 MG tablet Commonly known as: Protonix Take 1 tablet (40 mg total) by mouth daily.   predniSONE 20 MG tablet Commonly known as: DELTASONE Take 3 tablets by mouth daily x 1 day; then 2 tablet by mouth daily x 2 days; then 1 tablet by mouth daily x 3 days; then half tablet by mouth daily x 3 days and stop prednisone.   pregabalin 200 MG capsule Commonly known as: LYRICA Take 1 capsule (200 mg total) by mouth 3 (three) times daily.   prochlorperazine 10 MG tablet Commonly known as: COMPAZINE Take 1 tablet (10 mg total) by mouth every 6 (six) hours as needed (Nausea or vomiting).        Follow-up  Information     Billie Lade, MD. Schedule an appointment as soon as possible for a visit in 10 day(s).   Specialty: Internal Medicine Contact information: [address] [name] [phone]                Discharge Exam: Ceasar Mons Weights   10/25/23 2150 10/26/23 0042  Weight: 75 kg 70.4 kg   General exam: Alert, awake, oriented x 3; good saturation on room air  appreciated; speaking in full sentences and hemodynamically stable. Respiratory system: Improved air movement bilaterally; no using accessory muscles.  Positive scattered rhonchi appreciated bilaterally. Cardiovascular system:RRR. No rubs or gallops; no JVD. Gastrointestinal system: Abdomen is nondistended, soft and nontender. No organomegaly or masses felt. Normal bowel sounds heard. Central nervous system: No focal neurological deficits. Extremities: No cyanosis, clubbing or edema. Skin: No petechiae. Psychiatry: Judgement and insight appear normal. Mood & affect appropriate.    Condition at discharge: Stable and improved.  The results of significant diagnostics from this hospitalization (including imaging, microbiology, ancillary and laboratory) are listed below for reference.   Imaging Studies: DG Chest Portable 1 View Result Date: 10/25/2023 CLINICAL DATA:  Difficulty breathing, flu like symptoms EXAM: PORTABLE CHEST 1 VIEW COMPARISON:  08/29/2022.  Chest CT 10/19/2023 FINDINGS: Pulmonary nodules are noted in the left upper lobe and left lower lung. Nodular densities noted in the right mid lung and right lung base. These appear new and/or enlarged since recent CT. No effusions. Heart and mediastinal contours within normal limits. Right Port-A-Cath is unchanged since prior CT. IMPRESSION: Bilateral pulmonary nodules/metastases, some of which appear enlarged since recent CT. Electronically Signed   By: Charlett Nose M.D.   On: 10/25/2023 22:15   CT CHEST ABDOMEN PELVIS W CONTRAST Result Date: 10/20/2023 CLINICAL DATA:  History of metastatic cervical cancer, follow-up. * Tracking Code: BO * EXAM: CT CHEST, ABDOMEN, AND PELVIS WITH CONTRAST TECHNIQUE: Multidetector CT imaging of the chest, abdomen and pelvis was performed following the standard protocol during bolus administration of intravenous contrast. RADIATION DOSE REDUCTION: This exam was performed according to the departmental  dose-optimization program which includes automated exposure control, adjustment of the mA and/or kV according to patient size and/or use of iterative reconstruction technique. CONTRAST:  OMNIPAQUE IOHEXOL 300 MG/ML  SOLN COMPARISON:  Multiple priors including most recent CT May 08, 2023 FINDINGS: CT CHEST FINDINGS Cardiovascular: Right chest Port-A-Cath tip near the superior cavoatrial junction. Normal caliber thoracic aorta. No central pulmonary embolus on this nondedicated study. Normal size heart. Mediastinum/Nodes: No supraclavicular adenopathy. No suspicious thyroid nodule. No pathologically enlarged mediastinal, hilar or axillary lymph nodes. The esophagus is grossly unremarkable. Lungs/Pleura: Increased size and number of bilateral pulmonary nodules. For instance: -Pleural-based pulmonary nodule along the left major fissure measures 19 mm on image 85/3 previously 5 mm. -Superior segment of the left lower lobe measuring 6 mm on image 46/3, new from prior. -subpleural right middle lobe pulmonary nodule measures 12 mm on image 85/3, new from prior. Progressive apical predominant fine centrilobular nodularity possibly reflecting smoking related respiratory bronchiolitis. Musculoskeletal: No aggressive lytic or blastic lesion of bone. CT ABDOMEN PELVIS FINDINGS Hepatobiliary: New subtle hepatic hypodensities.  For reference: -In the central lateral left lobe of the liver measuring 7 mm on image 49/2 -In the right lobe of the liver measuring 7 mm on image 51/2. Gallbladder is unremarkable.  No biliary ductal dilation. Pancreas: No pancreatic ductal dilation or evidence of acute inflammation. Spleen: No splenomegaly. Adrenals/Urinary  Tract: Bilateral adrenal glands appear normal. No hydronephrosis. Kidneys demonstrate symmetric enhancement. Urinary bladder is unremarkable for degree of distension. Stomach/Bowel: Stomach is within normal limits. Appendix appears normal. No evidence of bowel wall  thickening, distention, or inflammatory changes. Vascular/Lymphatic: Aortic atherosclerosis. Enlarged left periaortic lymph node measures 10 mm on image 65/2 previously 7 mm. Reproductive: New fluid and irregular nodular enhancement in the endometrial canal. Other: No significant abdominopelvic free fluid. Musculoskeletal: No aggressive lytic or blastic lesion of bone. Avascular necrosis of the right femoral head. IMPRESSION: 1. Increased size and number of bilateral pulmonary nodules, consistent with worsening metastatic disease. 2. New subtle hepatic hypodensities, suspicious for metastatic disease. 3. New fluid and irregular nodular enhancement in the endometrial canal, suspicious for recurrent disease. 4. Enlarged left periaortic lymph node, suspicious for metastatic disease. 5.  Aortic Atherosclerosis (ICD10-I70.0). Electronically Signed   By: Maudry Mayhew M.D.   On: 10/20/2023 10:04    Microbiology: Results for orders placed or performed during the hospital encounter of 10/25/23  Resp panel by RT-PCR (RSV, Flu A&B, Covid) Anterior Nasal Swab     Status: Abnormal   Collection Time: 10/25/23  9:56 PM   Specimen: Anterior Nasal Swab  Result Value Ref Range Status   SARS Coronavirus 2 by RT PCR NEGATIVE NEGATIVE Final    Comment: (NOTE) SARS-CoV-2 target nucleic acids are NOT DETECTED.  The SARS-CoV-2 RNA is generally detectable in upper respiratory specimens during the acute phase of infection. The lowest concentration of SARS-CoV-2 viral copies this assay can detect is 138 copies/mL. A negative result does not preclude SARS-Cov-2 infection and should not be used as the sole basis for treatment or other patient management decisions. A negative result may occur with  improper specimen collection/handling, submission of specimen other than nasopharyngeal swab, presence of viral mutation(s) within the areas targeted by this assay, and inadequate number of viral copies(<138 copies/mL). A  negative result must be combined with clinical observations, patient history, and epidemiological information. The expected result is Negative.  Fact Sheet for Patients:  BloggerCourse.com  Fact Sheet for Healthcare Providers:  SeriousBroker.it  This test is no t yet approved or cleared by the [place] FDA and  has been authorized for detection and/or diagnosis of SARS-CoV-2 by FDA under an Emergency Use Authorization (EUA). This EUA will remain  in effect (meaning this test can be used) for the duration of the COVID-19 declaration under Section 564(b)(1) of the Act, 21 U.S.C.section 360bbb-3(b)(1), unless the authorization is terminated  or revoked sooner.       Influenza A by PCR POSITIVE (A) NEGATIVE Final   Influenza B by PCR NEGATIVE NEGATIVE Final    Comment: (NOTE) The Xpert Xpress SARS-CoV-2/FLU/RSV plus assay is intended as an aid in the diagnosis of influenza from Nasopharyngeal swab specimens and should not be used as a sole basis for treatment. Nasal washings and aspirates are unacceptable for Xpert Xpress SARS-CoV-2/FLU/RSV testing.  Fact Sheet for Patients: BloggerCourse.com  Fact Sheet for Healthcare Providers: SeriousBroker.it  This test is not yet approved or cleared by the [place] FDA and has been authorized for detection and/or diagnosis of SARS-CoV-2 by FDA under an Emergency Use Authorization (EUA). This EUA will remain in effect (meaning this test can be used) for the duration of the COVID-19 declaration under Section 564(b)(1) of the Act, 21 U.S.C. section 360bbb-3(b)(1), unless the authorization is terminated or revoked.     Resp Syncytial Virus by PCR NEGATIVE NEGATIVE Final    Comment: (NOTE)  Fact Sheet for Patients: BloggerCourse.com  Fact Sheet for Healthcare  Providers: SeriousBroker.it  This test is not yet approved or cleared by the [place] FDA and has been authorized for detection and/or diagnosis of SARS-CoV-2 by FDA under an Emergency Use Authorization (EUA). This EUA will remain in effect (meaning this test can be used) for the duration of the COVID-19 declaration under Section 564(b)(1) of the Act, 21 U.S.C. section 360bbb-3(b)(1), unless the authorization is terminated or revoked.  Performed at [facility], [address], [place], [name]   Blood Culture (routine x 2)     Status: None (Preliminary result)   Collection Time: 10/25/23 10:45 PM   Specimen: BLOOD  Result Value Ref Range Status   Specimen Description BLOOD BLOOD RIGHT ARM  Final   Special Requests NONE  Final   Culture   Final    NO GROWTH 3 DAYS Performed at [facility], [address], [place], [place] [address]    Report Status PENDING  Incomplete  Blood Culture (routine x 2)     Status: None (Preliminary result)   Collection Time: 10/25/23 10:47 PM   Specimen: BLOOD  Result Value Ref Range Status   Specimen Description BLOOD BLOOD RIGHT HAND  Final   Special Requests NONE  Final   Culture   Final    NO GROWTH 3 DAYS Performed at [facility], [address] [place], [place] [address]    Report Status PENDING  Incomplete  MRSA Next Gen by PCR, Nasal     Status: None   Collection Time: 10/26/23 12:52 AM   Specimen: Nasal Mucosa; Nasal Swab  Result Value Ref Range Status   MRSA by PCR Next Gen NOT DETECTED NOT DETECTED Final    Comment: (NOTE) The GeneXpert MRSA Assay (FDA approved for NASAL specimens only), is one component of a comprehensive MRSA colonization surveillance program. It is not intended to diagnose MRSA infection nor to guide or monitor treatment for MRSA infections. Test performance is not FDA approved in patients less than 21 years old. Performed at [facility], [address] [place],  [place] [address]   Respiratory (~20 pathogens) panel by PCR     Status: Abnormal   Collection Time: 10/26/23  2:06 PM   Specimen: Nasopharyngeal Swab; Respiratory  Result Value Ref Range Status   Adenovirus NOT DETECTED NOT DETECTED Final   Coronavirus 229E NOT DETECTED NOT DETECTED Final    Comment: (NOTE) The Coronavirus on the Respiratory Panel, DOES NOT test for the novel  Coronavirus (2019 nCoV)    Coronavirus HKU1 NOT DETECTED NOT DETECTED Final   Coronavirus NL63 NOT DETECTED NOT DETECTED Final   Coronavirus OC43 NOT DETECTED NOT DETECTED Final   Metapneumovirus NOT DETECTED NOT DETECTED Final   Rhinovirus / Enterovirus NOT DETECTED NOT DETECTED Final   Influenza A H1 2009 DETECTED (A) NOT DETECTED Final   Influenza B NOT DETECTED NOT DETECTED Final   Parainfluenza Virus 1 NOT DETECTED NOT DETECTED Final   Parainfluenza Virus 2 NOT DETECTED NOT DETECTED Final   Parainfluenza Virus 3 NOT DETECTED NOT DETECTED Final   Parainfluenza Virus 4 NOT DETECTED NOT DETECTED Final   Respiratory Syncytial Virus NOT DETECTED NOT DETECTED Final   Bordetella pertussis NOT DETECTED NOT DETECTED Final   Bordetella Parapertussis NOT DETECTED NOT DETECTED Final   Chlamydophila pneumoniae NOT DETECTED NOT DETECTED Final   Mycoplasma pneumoniae NOT DETECTED NOT DETECTED Final    Comment: Performed at [facility],  [address] [place], [place] [address]    Labs: CBC: Recent Labs  Lab 10/25/23 2156 10/26/23 0342  WBC 6.7 5.9  HGB 12.5 10.6*  HCT 35.6* 31.6*  MCV 95.7 97.2  PLT 292 242   Basic Metabolic Panel: Recent Labs  Lab 10/25/23 2156 10/26/23 0342 10/27/23 0406 10/28/23 0348  NA 131* 128* 132* 134*  K 3.7 4.1 4.3 4.0  CL 91* 96* 100 102  CO2 22 21* 25 22  GLUCOSE 112* 107* 126* 131*  BUN 44* 39* 37* 32*  CREATININE 2.50* 1.87* 1.50* 1.21*  CALCIUM 8.9 7.9* 8.5* 8.3*  MG  --  1.6*  --   --   PHOS  --  3.8  --   --    Liver Function Tests: Recent Labs  Lab  10/25/23 2156 10/26/23 0342  AST 40 33  ALT 38 32  ALKPHOS 75 59  BILITOT 0.8 0.7  PROT 7.7 6.7  ALBUMIN 3.0* 2.5*   CBG: No results for input(s): "GLUCAP" in the last 168 hours.  Discharge time spent: greater than 30 minutes.  Signed: Vassie Loll, MD [facility] 10/28/2023

## 2023-10-28 NOTE — Plan of Care (Signed)
   Problem: Activity: Goal: Risk for activity intolerance will decrease Outcome: Progressing   Problem: Coping: Goal: Level of anxiety will decrease Outcome: Progressing

## 2023-10-28 NOTE — Progress Notes (Signed)
 Mobility Specialist Progress Note:    10/28/23 1155  Mobility  Activity Ambulated with assistance in hallway  Level of Assistance Standby assist, set-up cues, supervision of patient - no hands on  Assistive Device None  Distance Ambulated (ft) 120 ft  Range of Motion/Exercises Active;All extremities  Activity Response Tolerated well  Mobility Referral Yes  Mobility visit 1 Mobility  Mobility Specialist Start Time (ACUTE ONLY) 1050  Mobility Specialist Stop Time (ACUTE ONLY) 1110  Mobility Specialist Time Calculation (min) (ACUTE ONLY) 20 min   Pt received in bed, husband at bedside. Agreeable to mobility, required SBA to stand and ambulate with no AD. Tolerated well, SpO2 90% on RA throughout ambulation. Took standing rest breaks for SOB, recovered quickly. Returned to room, left pt supine. All needs met.   Lawerance Bach Mobility Specialist Please contact via Special educational needs teacher or  Rehab office at [phone]

## 2023-10-29 ENCOUNTER — Ambulatory Visit: Payer: Self-pay | Admitting: Genetic Counselor

## 2023-10-29 ENCOUNTER — Telehealth: Payer: Self-pay | Admitting: Genetic Counselor

## 2023-10-29 ENCOUNTER — Inpatient Hospital Stay: Payer: Medicaid Other | Admitting: Hematology

## 2023-10-29 ENCOUNTER — Inpatient Hospital Stay: Payer: Medicaid Other

## 2023-10-29 ENCOUNTER — Encounter: Payer: Self-pay | Admitting: Genetic Counselor

## 2023-10-29 DIAGNOSIS — C55 Malignant neoplasm of uterus, part unspecified: Secondary | ICD-10-CM

## 2023-10-29 DIAGNOSIS — Z1379 Encounter for other screening for genetic and chromosomal anomalies: Secondary | ICD-10-CM | POA: Insufficient documentation

## 2023-10-29 DIAGNOSIS — Z7189 Other specified counseling: Secondary | ICD-10-CM | POA: Insufficient documentation

## 2023-10-29 DIAGNOSIS — Z716 Tobacco abuse counseling: Secondary | ICD-10-CM | POA: Insufficient documentation

## 2023-10-29 NOTE — Telephone Encounter (Signed)
LM on VM that results were back and to please call.  Left CB instructions. 

## 2023-10-29 NOTE — Telephone Encounter (Signed)
 Revealed negative genetic testing.  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 It could be due to a different gene that we are not testing, or maybe our current technology may not be able to pick something up.  It will be important for her to keep in contact with genetics to keep up with whether additional testing may be needed.

## 2023-10-29 NOTE — Progress Notes (Signed)
 HPI:  Ms. Scantling was previously seen in the [facility] due to a personal history of uterine and cervical cancer and concerns regarding a hereditary predisposition to cancer. Please refer to our prior cancer genetics clinic note for more information regarding our discussion, assessment and recommendations, at the time. Ms. Bueso recent genetic test results were disclosed to her, as were recommendations warranted by these results. These results and recommendations are discussed in more detail below.  CANCER HISTORY:  Oncology History Overview Note  PD-L1 CPS 8%   Cervical cancer (HCC)  09/19/2022 Initial Diagnosis   Cervical cancer (HCC)   10/02/2022 -  Chemotherapy   [name]     10/29/2023 Genetic Testing   Negative genetic testing on the CancerNext+RNAinsight.  The report date is 10/29/2023.  The [facility] CancerNext+RNAinsight Panel includes sequencing, rearrangement analysis, and RNA analysis for the following 39 genes: APC, ATM, BAP1, BARD1, BMPR1A, BRCA1, BRCA2, BRIP1, CDH1, CDKN2A, CHEK2, FH, FLCN, MET, MLH1, MSH2, MSH6, MUTYH, NF1, NTHL1, PALB2, PMS2, PTEN, RAD51C, RAD51D, SMAD4, STK11, TP53, TSC1, TSC2, and VHL (sequencing and deletion/duplication); AXIN2, HOXB13, MBD4, MSH3, POLD1 and POLE (sequencing only); EPCAM and GREM1 (deletion/duplication only).   Uterine cancer (HCC)  10/15/2023 Initial Diagnosis   Uterine cancer (HCC)   10/29/2023 Genetic Testing   Negative genetic testing on the CancerNext+RNAinsight.  The report date is 10/29/2023.  The [facility] CancerNext+RNAinsight Panel includes sequencing, rearrangement analysis, and RNA analysis for the following 39 genes: APC, ATM, BAP1, BARD1, BMPR1A, BRCA1, BRCA2, BRIP1, CDH1, CDKN2A, CHEK2, FH, FLCN, MET, MLH1, MSH2, MSH6, MUTYH, NF1, NTHL1, PALB2, PMS2, PTEN, RAD51C, RAD51D, SMAD4, STK11, TP53, TSC1,  TSC2, and VHL (sequencing and deletion/duplication); AXIN2, HOXB13, MBD4, MSH3, POLD1 and POLE (sequencing only); EPCAM and GREM1 (deletion/duplication only).     FAMILY HISTORY:  We obtained a detailed, 4-generation family history.  Significant diagnoses are listed below: Family History  Problem Relation Age of Onset   Dermatomyositis Mother    Hypertension Mother    Cancer Mother        lung   Cancer Father        lung   Heart disease Father    Lung cancer Paternal Uncle    Lung cancer Maternal Uncle    Colon cancer Neg Hx    Breast cancer Neg Hx    Ovarian cancer Neg Hx    Endometrial cancer Neg Hx    Pancreatic cancer Neg Hx    Prostate cancer Neg Hx            Ms. Ales is unaware of previous family history of genetic testing for hereditary cancer risks. There is no reported Ashkenazi Jewish ancestry. There is no known consanguinity.  GENETIC TEST RESULTS: Genetic testing reported out on October 28, 2023 through the CancerNext+RNA cancer panel found no pathogenic mutations. The [facility] CancerNext+RNAinsight Panel includes sequencing, rearrangement analysis, and RNA analysis for the following 39 genes: APC, ATM, BAP1, BARD1, BMPR1A, BRCA1, BRCA2, BRIP1, CDH1, CDKN2A, CHEK2, FH, FLCN, MET, MLH1, MSH2, MSH6, MUTYH, NF1, NTHL1, PALB2, PMS2, PTEN, RAD51C, RAD51D, SMAD4, STK11, TP53, TSC1, TSC2, and VHL (sequencing and deletion/duplication); AXIN2, HOXB13, MBD4, MSH3, POLD1 and POLE (sequencing only); EPCAM and GREM1 (deletion/duplication only). The test report has been scanned into EPIC and is located under the Molecular Pathology section of the Results Review tab.  A portion of the result report is  included below for reference.     We discussed with Ms. Berni that because current genetic testing is not perfect, it is possible there may be a gene mutation in one of these genes that current testing cannot detect, but that chance is small.  We also discussed, that there could be another  gene that has not yet been discovered, or that we have not yet tested, that is responsible for the cancer diagnoses in the family. It is also possible there is a hereditary cause for the cancer in the family that Ms. Bessler did not inherit and therefore was not identified in her testing.  Therefore, it is important to remain in touch with cancer genetics in the future so that we can continue to offer Ms. Nwosu the most up to date genetic testing.   ADDITIONAL GENETIC TESTING: We discussed with Ms. Fong that there are other genes that are associated with increased cancer risk that can be analyzed. Should Ms. Betterton wish to pursue additional genetic testing, we are happy to discuss and coordinate this testing, at any time.    CANCER SCREENING RECOMMENDATIONS: Ms. Hass test result is considered negative (normal).  This means that we have not identified a hereditary cause for her personal history of uterine and cervical cancer at this time. Most cancers happen by chance and this negative test suggests that her personal history of cancer may fall into this category.    Possible reasons for Ms. Doukas's negative genetic test include:  1. There may be a gene mutation in one of these genes that current testing methods cannot detect but that chance is small.  2. There could be another gene that has not yet been discovered, or that we have not yet tested, that is responsible for the cancer diagnoses in the family.  3.  There may be no hereditary risk for cancer in the family. The cancers in Ms. Gills and/or her family may be sporadic/familial or due to other genetic and environmental factors. 4. It is also possible there is a hereditary cause for the cancer in the family that Ms. Moravek did not inherit.  Therefore, it is recommended she continue to follow the cancer management and screening guidelines provided by her oncology and primary healthcare provider. An individual's cancer risk and medical management are not  determined by genetic test results alone. Overall cancer risk assessment incorporates additional factors, including personal medical history, family history, and any available genetic information that may result in a personalized plan for cancer prevention and surveillance  RECOMMENDATIONS FOR FAMILY MEMBERS:   Since she did not inherit a identifiable mutation in a cancer predisposition gene included on this panel, her children could not have inherited a known mutation from her in one of these genes. Individuals in this family might be at some increased risk of developing cancer, over the general population risk, simply due to the family history of cancer.  We recommended women in this family have a yearly mammogram beginning at age 32, or 32 years younger than the earliest onset of cancer, an annual clinical breast exam, and perform monthly breast self-exams. Women in this family should also have a gynecological exam as recommended by their primary provider. All family members should be referred for colonoscopy starting at age 31, or 34 years younger than the earliest onset of cancer.  FOLLOW-UP: Lastly, we discussed with Ms. Antonio that cancer genetics is a rapidly advancing field and it is possible that new genetic tests will be appropriate  for her and/or her family members in the future. We encouraged her to remain in contact with cancer genetics on an annual basis so we can update her personal and family histories and let her know of advances in cancer genetics that may benefit this family.   Our contact number was provided. Ms. Korber questions were answered to her satisfaction, and she knows she is welcome to call us at anytime with additional questions or concerns.   Maylon Cos, MS, [facility] Licensed, Certified Genetic Counselor [email or web address]

## 2023-10-30 ENCOUNTER — Telehealth: Payer: Self-pay | Admitting: Internal Medicine

## 2023-10-30 ENCOUNTER — Other Ambulatory Visit: Payer: Self-pay | Admitting: Internal Medicine

## 2023-10-30 LAB — CULTURE, BLOOD (ROUTINE X 2)
Culture: NO GROWTH
Culture: NO GROWTH

## 2023-10-30 NOTE — Telephone Encounter (Signed)
 Medication refilled 10/28/23.

## 2023-10-30 NOTE — Telephone Encounter (Signed)
 Copied from CRM [id number]. Topic: Clinical - Prescription Issue >> Oct 30, 2023  2:35 PM Maree Krabbe H wrote: Reason for CRM: Patient called in today because they needed a rx refill, neither pharmacy called had what the patient needed, patient wants to know can the oxyCODONE (OXY IR/ROXICODONE) be replaced with some other pain medicine, patient is requesting a callback at [phone].

## 2023-10-31 DIAGNOSIS — Z419 Encounter for procedure for purposes other than remedying health state, unspecified: Secondary | ICD-10-CM | POA: Diagnosis not present

## 2023-11-02 NOTE — Progress Notes (Signed)
 [facility] [address] [name]    Clinic Day:  11/03/23     Referring physician: Billie Lade, MD  Patient Care Team: Kristin Lade, MD as PCP - General (Internal Medicine) Doreatha Massed, MD as Medical Oncologist (Medical Oncology) Therese Sarah, RN as Oncology Nurse Navigator (Medical Oncology)   ASSESSMENT & PLAN:   Assessment: 1.  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 - CTAP (08/30/2022): Irregularity of the endometrium extending through the cervix with foci of gas in the cervix.  Multifocal omental and peritoneal metastasis.  Metastatic left periaortic, aortocaval, left external iliac and right common iliac lymph nodes.  Multiple bilateral lung nodules at bases.  12 mm hypodensity in the liver near the falciform ligament. - Endometrial biopsy (09/09/2022): Invasive moderately to poorly differentiated squamous cell carcinoma with abundant necrosis.  Cervix biopsy: Invasive and in situ moderate to poorly differentiated squamous cell carcinoma.  High risk HPV positive. - NGS: PD-L1 (22 C3): CPS 2, HER2 IHC negative.  PIK3CA exon 10 pathogenic variant.  MS-stable.  TMB-low.  FANCM and SDHA pathogenic variants. - PD-L1 CPS: 8% - Cycle 1 of cisplatin, paclitaxel, bevacizumab on 10/02/2022, pembrolizumab added with cycle 2 on 10/23/2022 - Bone scan (10/14/2022): No metastatic disease   2.  Social/family history: - She is married and lives at home with her husband and roommate.  She has never worked.  She is current active smoker, half pack per day, for the last 28 years.  She occasionally smokes marijuana. - Mother died of lung cancer.  Father had head and neck cancer.  Maternal grandmother had cancer.  Paternal uncle died of cancer.  Maternal uncle also had cancer.     Plan: 1.  Metastatic cervical cancer to the lungs, peritoneum and liver: - She was recently hospitalized with influenza infection and was discharged last week.  She reported decreased hearing in the left ear with achy sensation with no discharge.  Denies any sore throat. - We reviewed CT CAP from 10/19/2023: Increased size and number of bilateral lung nodules.  New subtle hepatic hypodensity suspicious for metastatic disease.  Enlarged left periportal lymph node. - As she had progression, will discontinue carboplatin, pembrolizumab and bevacizumab. - We discussed subsequent therapy with tisotumab vedotin which has shown improvement in OS compared to standard chemotherapy.  We discussed side effects including anemia, fatigue, abdominal pain and worsening neuropathy. - Because of her new symptoms in the left ear, I recommend MRI of the brain. - We discussed the need for ophthalmic exam with visual acuity, slit-lamp exam prior to every cycle for the first 9 cycles.  We have prescribed eyedrops to be used immediately prior to infusion.   2.  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  Last hemoglobin was 9.3.   3.  Peripheral neuropathy: - Continue Lyrica 200 mg 3 times daily.  She will also continue oxycodone 5 mg every 6 hours as needed.   4.  Hypertension: - She will continue amlodipine 10 mg daily.  Blood pressure is 149/75.        Orders Placed This Encounter  Procedures   Consent Attestation for Oncology Treatment    The patient is informed of risks, benefits, side-effects of the prescribed oncology treatment. Potential short term and long term side effects and response rates  discussed. After a long discussion, the patient made informed decision to proceed.:   Yes   MR Brain W Wo Contrast    Standing Status:   Future    Expected Date:   11/10/2023    Expiration Date:   11/02/2024    If indicated for the ordered procedure, I authorize the administration of contrast media per  Radiology protocol:   Yes    What is the patient's sedation requirement?:   No Sedation    Does the patient have a pacemaker or implanted devices?:   No    Use SRS Protocol?:   No    Preferred imaging location?:   [facility] (table limit - 550lbs)   Magnesium    Standing Status:   Future    Expected Date:   11/10/2023    Expiration Date:   11/09/2024   CBC with Differential    Standing Status:   Future    Expected Date:   11/10/2023    Expiration Date:   11/09/2024   Comprehensive metabolic panel    Standing Status:   Future    Expected Date:   11/10/2023    Expiration Date:   11/09/2024   Magnesium    Standing Status:   Future    Expected Date:   12/01/2023    Expiration Date:   11/30/2024   CBC with Differential    Standing Status:   Future    Expected Date:   12/01/2023    Expiration Date:   11/30/2024   Comprehensive metabolic panel    Standing Status:   Future    Expected Date:   12/01/2023    Expiration Date:   11/30/2024   Magnesium    Standing Status:   Future    Expected Date:   12/22/2023    Expiration Date:   12/21/2024   CBC with Differential    Standing Status:   Future    Expected Date:   12/22/2023    Expiration Date:   12/21/2024   Comprehensive metabolic panel    Standing Status:   Future    Expected Date:   12/22/2023    Expiration Date:   12/21/2024   Magnesium    Standing Status:   Future    Expected Date:   01/12/2024    Expiration Date:   01/11/2025   CBC with Differential    Standing Status:   Future    Expected Date:   01/12/2024    Expiration Date:   01/11/2025   Comprehensive metabolic panel    Standing Status:   Future    Expected Date:   01/12/2024    Expiration Date:   01/11/2025   PHYSICIAN COMMUNICATION ORDER    Topical corticosteroid eye drops: The initial prescription and all renewals of any corticosteroid medication should be made only after examination with a slit lamp by an ophthalmologist Instruct patients to avoid use of contact lenses  throughout treatment      I,Helena R Teague,acting as a scribe for Doreatha Massed, MD.,have documented all relevant documentation on the behalf of Doreatha Massed, MD,as directed by  Doreatha Massed, MD while in the presence of Doreatha Massed, MD.  I, Doreatha Massed MD, have reviewed the above documentation for accuracy and completeness, and I agree with the above.      Doreatha Massed, MD   3/4/20251:55 PM  CHIEF COMPLAINT:   Diagnosis: metastatic cervical squamous cell carcinoma    Cancer Staging  Cervical cancer [facility]) Staging  form: Cervix Uteri, AJCC Version 9 - Clinical stage from 09/19/2022: FIGO Stage IVB (cT4, cN2a, cM1) - Unsigned    Prior Therapy: none  Current Therapy:  Cisplatin + Paclitaxel + Bevacizumab, Keytruda   HISTORY OF PRESENT ILLNESS:   Oncology History Overview Note  PD-L1 CPS 8%   Cervical cancer (HCC)  09/19/2022 Initial Diagnosis   Cervical cancer (HCC)   10/02/2022 - 09/17/2023 Chemotherapy   [name]     10/29/2023 Genetic Testing   Negative genetic testing on the CancerNext+RNAinsight.  The report date is 10/29/2023.  The [facility] CancerNext+RNAinsight Panel includes sequencing, rearrangement analysis, and RNA analysis for the following 39 genes: APC, ATM, BAP1, BARD1, BMPR1A, BRCA1, BRCA2, BRIP1, CDH1, CDKN2A, CHEK2, FH, FLCN, MET, MLH1, MSH2, MSH6, MUTYH, NF1, NTHL1, PALB2, PMS2, PTEN, RAD51C, RAD51D, SMAD4, STK11, TP53, TSC1, TSC2, and VHL (sequencing and deletion/duplication); AXIN2, HOXB13, MBD4, MSH3, POLD1 and POLE (sequencing only); EPCAM and GREM1 (deletion/duplication only).   11/10/2023 -  Chemotherapy   Patient is on Treatment Plan : CERVICAL Tisotumab vedotin-tftv (Tivdak) q21d     Uterine cancer (HCC)  10/15/2023 Initial Diagnosis   Uterine cancer (HCC)   10/29/2023 Genetic Testing   Negative  genetic testing on the CancerNext+RNAinsight.  The report date is 10/29/2023.  The [facility] CancerNext+RNAinsight Panel includes sequencing, rearrangement analysis, and RNA analysis for the following 39 genes: APC, ATM, BAP1, BARD1, BMPR1A, BRCA1, BRCA2, BRIP1, CDH1, CDKN2A, CHEK2, FH, FLCN, MET, MLH1, MSH2, MSH6, MUTYH, NF1, NTHL1, PALB2, PMS2, PTEN, RAD51C, RAD51D, SMAD4, STK11, TP53, TSC1, TSC2, and VHL (sequencing and deletion/duplication); AXIN2, HOXB13, MBD4, MSH3, POLD1 and POLE (sequencing only); EPCAM and GREM1 (deletion/duplication only).      INTERVAL HISTORY:   Maaliyah is a 53 y.o. female presenting to clinic today for follow up of metastatic cervical squamous cell carcinoma. She was last seen by me on 10/08/23.  Since her last visit, she was admitted to [facility] from 10/25/23 to 10/28/23 for Influenza A, treated with steroids and bronchodilators.   Tyffani underwent CT C/A/P on 10/19/23 that found: Increased size and number of bilateral pulmonary nodules, consistent with worsening metastatic disease. New subtle hepatic hypodensities, suspicious for metastatic disease. New fluid and irregular nodular enhancement in the endometrial canal, suspicious for recurrent disease. Enlarged left periaortic lymph node, suspicious for metastatic disease.  Today, she states that she is doing well overall. Her appetite level is at 55%. Her energy level is at 70%. She is accompanied by a friend.  Mylena states since discharge from the [facility], it is difficult to hear from her left ear. She denies any drainage, though it feels like there is some fluid in the left ear. Sehar also reports aching pain of the inner left ear. She notes she is still recovering from the flu and has generalized weakness. She denies any sore throat.   Zainah reports neuropathy in the hands and feet are a 7/10 pain severity. She takes gabapentin and hydrocodone for painful neuropathy, which reduces pain to a 5/10. Urination is normal.    Takyah would like to proceed with new chemotherapy. She does not have an optometrist and would like a referral to one in order to start new treatment. She notes she does have poor eyesight.   Zahari would like a refill of her gabapentin as she recently ran out.   PAST MEDICAL HISTORY:   Past Medical History: Past Medical  History:  Diagnosis Date   Anemia    Asthma    Bronchitis    Cancer (HCC)    Cervical   CHF (congestive heart failure) (HCC)    COPD (chronic obstructive pulmonary disease) (HCC)    Depression    GERD (gastroesophageal reflux disease)    Headache    Hypertension    Port-A-Cath in place 09/25/2022    Surgical History: Past Surgical History:  Procedure Laterality Date   BREAST SURGERY     CESAREAN SECTION     IR IMAGING GUIDED PORT INSERTION  09/30/2022   LEG SURGERY      Social History: Social History   Socioeconomic History   Marital status: Married    Spouse name: Not on file   Number of children: Not on file   Years of education: Not on file   Highest education level: Not on file  Occupational History   Not on file  Tobacco Use   Smoking status: Every Day    Current packs/day: 0.25    Average packs/day: 0.3 packs/day for 23.0 years (5.8 ttl pk-yrs)    Types: Cigarettes   Smokeless tobacco: Never  Vaping Use   Vaping status: Never Used  Substance and Sexual Activity   Alcohol use: Yes    Comment: occ   Drug use: Yes    Types: Marijuana   Sexual activity: Yes    Birth control/protection: None  Other Topics Concern   Not on file  Social History Narrative   Not on file   Social Drivers of Health   Financial Resource Strain: Low Risk  (09/09/2022)   Overall Financial Resource Strain (CARDIA)    Difficulty of Paying Living Expenses: Not hard at all  Food Insecurity: No Food Insecurity (10/26/2023)   Hunger Vital Sign    Worried About Running Out of Food in the Last Year: Never true    Ran Out of Food in the Last Year: Never true   Transportation Needs: No Transportation Needs (10/26/2023)   PRAPARE - Administrator, Civil Service (Medical): No    Lack of Transportation (Non-Medical): No  Physical Activity: Insufficiently Active (09/09/2022)   Exercise Vital Sign    Days of Exercise per Week: 2 days    Minutes of Exercise per Session: 20 min  Stress: No Stress Concern Present (09/09/2022)   [employer] of Occupational Health - Occupational Stress Questionnaire    Feeling of Stress : Only a little  Social Connections: Socially Isolated (09/09/2022)   Social Connection and Isolation Panel [NHANES]    Frequency of Communication with Friends and Family: Three times a week    Frequency of Social Gatherings with Friends and Family: More than three times a week    Attends Religious Services: Never    Database administrator or Organizations: No    Attends Banker Meetings: Never    Marital Status: Separated  Intimate Partner Violence: Not At Risk (10/26/2023)   Humiliation, Afraid, Rape, and Kick questionnaire    Fear of Current or Ex-Partner: No    Emotionally Abused: No    Physically Abused: No    Sexually Abused: No    Family History: Family History  Problem Relation Age of Onset   Dermatomyositis Mother    Hypertension Mother    Cancer Mother        lung   Cancer Father        lung   Heart disease Father    Lung  cancer Paternal Uncle    Lung cancer Maternal Uncle    Colon cancer Neg Hx    Breast cancer Neg Hx    Ovarian cancer Neg Hx    Endometrial cancer Neg Hx    Pancreatic cancer Neg Hx    Prostate cancer Neg Hx     Current Medications:  Current Outpatient Medications:    acetaminophen (TYLENOL) 500 MG tablet, Take 2 tablets (1,000 mg total) by mouth every 8 (eight) hours as needed for mild pain (pain score 1-3), fever or headache., Disp: , Rfl:    amLODipine (NORVASC) 10 MG tablet, Take 1 tablet (10 mg total) by mouth daily., Disp: 30 tablet, Rfl: 3    budesonide-formoterol (SYMBICORT) 160-4.5 MCG/ACT inhaler, Inhale 2 puffs into the lungs in the morning and at bedtime., Disp: 1 each, Rfl: 3   cetirizine (ZYRTEC ALLERGY) 10 MG tablet, Take 1 tablet (10 mg total) by mouth daily., Disp: 30 tablet, Rfl: 1   dextromethorphan-guaiFENesin (MUCINEX DM) 30-600 MG 12hr tablet, Take 1 tablet by mouth 2 (two) times daily for 10 days., Disp: 20 tablet, Rfl: 0   levalbuterol (XOPENEX HFA) 45 MCG/ACT inhaler, Inhale 2 puffs into the lungs every 8 (eight) hours as needed for wheezing or shortness of breath., Disp: 1 each, Rfl: 2   megestrol (MEGACE) 400 MG/10ML suspension, Take 10 mLs (400 mg total) by mouth 2 (two) times daily., Disp: 480 mL, Rfl: 3   pantoprazole (PROTONIX) 40 MG tablet, Take 1 tablet (40 mg total) by mouth daily., Disp: 90 tablet, Rfl: 1   predniSONE (DELTASONE) 20 MG tablet, Take 3 tablets by mouth daily x 1 day; then 2 tablet by mouth daily x 2 days; then 1 tablet by mouth daily x 3 days; then half tablet by mouth daily x 3 days and stop prednisone., Disp: 12 tablet, Rfl: 0   brimonidine (ALPHAGAN P) 0.1 % SOLN, Place 3 drops into both eyes immediately prior to start of Tivdak infusion, Disp: 5 mL, Rfl: 11   Carboxymethylcellulose Sod PF 0.5 % SOLN, Place 2 drops into both eyes 4 (four) times daily. Continue for 30 days after last dose of Tivdak, Disp: 30 each, Rfl: 11   dexamethasone (DECADRON) 0.1 % ophthalmic suspension, Instill 1 drop into both eyes immediately prior to Tivdak and in the morning, in the evening and before bedtime for 72 hours after infusion, Disp: 5 mL, Rfl: 11   oxyCODONE (OXY IR/ROXICODONE) 5 MG immediate release tablet, Take 1 tablet (5 mg total) by mouth every 6 (six) hours as needed for severe pain (pain score 7-10)., Disp: 90 tablet, Rfl: 0   pregabalin (LYRICA) 200 MG capsule, Take 1 capsule (200 mg total) by mouth 3 (three) times daily., Disp: 180 capsule, Rfl: 0   Allergies: Allergies  Allergen Reactions    Carrot [Daucus Carota] Hives   Lisinopril-Hydrochlorothiazide     Oral swelling   Ibuprofen Other (See Comments)    Oral swelling   Other Itching and Other (See Comments)    Hair dye, blisters, pus-filled, soreness   Tramadol Hives   Erythromycin Hives    REVIEW OF SYSTEMS:   Review of Systems  Constitutional:  Negative for chills, fatigue and fever.  HENT:   Negative for lump/mass, mouth sores, nosebleeds, sore throat and trouble swallowing.        +decreased hearing and pain in left ear, 6/10 pain severity  Eyes:  Positive for eye problems.  Respiratory:  Positive for cough and shortness of breath.  Cardiovascular:  Positive for chest pain and palpitations. Negative for leg swelling.  Gastrointestinal:  Positive for constipation. Negative for abdominal pain, diarrhea, nausea and vomiting.  Genitourinary:  Negative for bladder incontinence, difficulty urinating, dysuria, frequency, hematuria and nocturia.   Musculoskeletal:  Negative for arthralgias, back pain, flank pain, myalgias and neck pain.  Skin:  Negative for itching and rash.  Neurological:  Positive for dizziness and numbness (in feet and hands, 6/10 pain severity). Negative for headaches.  Hematological:  Does not bruise/bleed easily.  Psychiatric/Behavioral:  Positive for sleep disturbance. Negative for depression and suicidal ideas. The patient is not nervous/anxious.   All other systems reviewed and are negative.    VITALS:   Last menstrual period 08/15/2022.  Wt Readings from Last 3 Encounters:  11/03/23 168 lb 3.4 oz (76.3 kg)  10/26/23 155 lb 3.3 oz (70.4 kg)  10/08/23 164 lb 14.5 oz (74.8 kg)    There is no height or weight on file to calculate BMI.  Performance status (ECOG): 1 - Symptomatic but completely ambulatory  PHYSICAL EXAM:   Physical Exam Vitals and nursing note reviewed. Exam conducted with a chaperone present.  Constitutional:      Appearance: Normal appearance.  Cardiovascular:      Rate and Rhythm: Normal rate and regular rhythm.     Pulses: Normal pulses.     Heart sounds: Normal heart sounds.  Pulmonary:     Effort: Pulmonary effort is normal.     Breath sounds: Normal breath sounds.  Abdominal:     Palpations: Abdomen is soft. There is no hepatomegaly, splenomegaly or mass.     Tenderness: There is no abdominal tenderness.  Musculoskeletal:     Right lower leg: No edema.     Left lower leg: No edema.  Lymphadenopathy:     Cervical: No cervical adenopathy.     Right cervical: No superficial, deep or posterior cervical adenopathy.    Left cervical: No superficial, deep or posterior cervical adenopathy.     Upper Body:     Right upper body: No supraclavicular or axillary adenopathy.     Left upper body: No supraclavicular or axillary adenopathy.  Neurological:     General: No focal deficit present.     Mental Status: She is alert and oriented to person, place, and time.  Psychiatric:        Mood and Affect: Mood normal.        Behavior: Behavior normal.     LABS:      Latest Ref Rng & Units 11/03/2023    8:17 AM 10/26/2023    3:42 AM 10/25/2023    9:56 PM  CBC  WBC 4.0 - 10.5 K/uL 12.5  5.9  6.7   Hemoglobin 12.0 - 15.0 g/dL 9.3  25.3  66.4   Hematocrit 36.0 - 46.0 % 27.4  31.6  35.6   Platelets 150 - 400 K/uL 316  242  292       Latest Ref Rng & Units 11/03/2023    8:17 AM 10/28/2023    3:48 AM 10/27/2023    4:06 AM  CMP  Glucose 70 - 99 mg/dL 97  403  474   BUN 6 - 20 mg/dL 12  32  37   Creatinine 0.44 - 1.00 mg/dL 2.59  5.63  8.75   Sodium 135 - 145 mmol/L 135  134  132   Potassium 3.5 - 5.1 mmol/L 4.1  4.0  4.3   Chloride 98 - 111  mmol/L 101  102  100   CO2 22 - 32 mmol/L 23  22  25    Calcium 8.9 - 10.3 mg/dL 8.6  8.3  8.5   Total Protein 6.5 - 8.1 g/dL 6.3     Total Bilirubin 0.0 - 1.2 mg/dL 0.4     Alkaline Phos 38 - 126 U/L 63     AST 15 - 41 U/L 33     ALT 0 - 44 U/L 37        No results found for: "CEA1", "CEA" / No results found  for: "CEA1", "CEA" No results found for: "PSA1" No results found for: "ZOX096" No results found for: "CAN125"  No results found for: "TOTALPROTELP", "ALBUMINELP", "A1GS", "A2GS", "BETS", "BETA2SER", "GAMS", "MSPIKE", "SPEI" Lab Results  Component Value Date   TIBC 412 09/22/2022   FERRITIN 35 09/22/2022   IRONPCTSAT 7 (L) 09/22/2022   No results found for: "LDH"   STUDIES:   DG Chest Portable 1 View Result Date: 10/25/2023 CLINICAL DATA:  Difficulty breathing, flu like symptoms EXAM: PORTABLE CHEST 1 VIEW COMPARISON:  08/29/2022.  Chest CT 10/19/2023 FINDINGS: Pulmonary nodules are noted in the left upper lobe and left lower lung. Nodular densities noted in the right mid lung and right lung base. These appear new and/or enlarged since recent CT. No effusions. Heart and mediastinal contours within normal limits. Right Port-A-Cath is unchanged since prior CT. IMPRESSION: Bilateral pulmonary nodules/metastases, some of which appear enlarged since recent CT. Electronically Signed   By: Charlett Nose M.D.   On: 10/25/2023 22:15   CT CHEST ABDOMEN PELVIS W CONTRAST Result Date: 10/20/2023 CLINICAL DATA:  History of metastatic cervical cancer, follow-up. * Tracking Code: BO * EXAM: CT CHEST, ABDOMEN, AND PELVIS WITH CONTRAST TECHNIQUE: Multidetector CT imaging of the chest, abdomen and pelvis was performed following the standard protocol during bolus administration of intravenous contrast. RADIATION DOSE REDUCTION: This exam was performed according to the departmental dose-optimization program which includes automated exposure control, adjustment of the mA and/or kV according to patient size and/or use of iterative reconstruction technique. CONTRAST:  OMNIPAQUE IOHEXOL 300 MG/ML  SOLN COMPARISON:  Multiple priors including most recent CT May 08, 2023 FINDINGS: CT CHEST FINDINGS Cardiovascular: Right chest Port-A-Cath tip near the superior cavoatrial junction. Normal caliber thoracic aorta. No  central pulmonary embolus on this nondedicated study. Normal size heart. Mediastinum/Nodes: No supraclavicular adenopathy. No suspicious thyroid nodule. No pathologically enlarged mediastinal, hilar or axillary lymph nodes. The esophagus is grossly unremarkable. Lungs/Pleura: Increased size and number of bilateral pulmonary nodules. For instance: -Pleural-based pulmonary nodule along the left major fissure measures 19 mm on image 85/3 previously 5 mm. -Superior segment of the left lower lobe measuring 6 mm on image 46/3, new from prior. -subpleural right middle lobe pulmonary nodule measures 12 mm on image 85/3, new from prior. Progressive apical predominant fine centrilobular nodularity possibly reflecting smoking related respiratory bronchiolitis. Musculoskeletal: No aggressive lytic or blastic lesion of bone. CT ABDOMEN PELVIS FINDINGS Hepatobiliary: New subtle hepatic hypodensities.  For reference: -In the central lateral left lobe of the liver measuring 7 mm on image 49/2 -In the right lobe of the liver measuring 7 mm on image 51/2. Gallbladder is unremarkable.  No biliary ductal dilation. Pancreas: No pancreatic ductal dilation or evidence of acute inflammation. Spleen: No splenomegaly. Adrenals/Urinary Tract: Bilateral adrenal glands appear normal. No hydronephrosis. Kidneys demonstrate symmetric enhancement. Urinary bladder is unremarkable for degree of distension. Stomach/Bowel: Stomach is within normal limits. Appendix appears  normal. No evidence of bowel wall thickening, distention, or inflammatory changes. Vascular/Lymphatic: Aortic atherosclerosis. Enlarged left periaortic lymph node measures 10 mm on image 65/2 previously 7 mm. Reproductive: New fluid and irregular nodular enhancement in the endometrial canal. Other: No significant abdominopelvic free fluid. Musculoskeletal: No aggressive lytic or blastic lesion of bone. Avascular necrosis of the right femoral head. IMPRESSION: 1. Increased size and  number of bilateral pulmonary nodules, consistent with worsening metastatic disease. 2. New subtle hepatic hypodensities, suspicious for metastatic disease. 3. New fluid and irregular nodular enhancement in the endometrial canal, suspicious for recurrent disease. 4. Enlarged left periaortic lymph node, suspicious for metastatic disease. 5.  Aortic Atherosclerosis (ICD10-I70.0). Electronically Signed   By: Maudry Mayhew M.D.   On: 10/20/2023 10:04

## 2023-11-03 ENCOUNTER — Other Ambulatory Visit: Payer: Self-pay | Admitting: *Deleted

## 2023-11-03 ENCOUNTER — Inpatient Hospital Stay: Payer: Medicaid Other | Attending: Hematology

## 2023-11-03 ENCOUNTER — Inpatient Hospital Stay: Payer: Medicaid Other

## 2023-11-03 ENCOUNTER — Inpatient Hospital Stay (HOSPITAL_BASED_OUTPATIENT_CLINIC_OR_DEPARTMENT_OTHER): Payer: Medicaid Other | Admitting: Hematology

## 2023-11-03 DIAGNOSIS — C539 Malignant neoplasm of cervix uteri, unspecified: Secondary | ICD-10-CM | POA: Diagnosis not present

## 2023-11-03 DIAGNOSIS — D649 Anemia, unspecified: Secondary | ICD-10-CM | POA: Diagnosis not present

## 2023-11-03 DIAGNOSIS — Z7689 Persons encountering health services in other specified circumstances: Secondary | ICD-10-CM | POA: Diagnosis not present

## 2023-11-03 DIAGNOSIS — Z95828 Presence of other vascular implants and grafts: Secondary | ICD-10-CM | POA: Diagnosis not present

## 2023-11-03 DIAGNOSIS — Z5111 Encounter for antineoplastic chemotherapy: Secondary | ICD-10-CM | POA: Diagnosis not present

## 2023-11-03 DIAGNOSIS — C786 Secondary malignant neoplasm of retroperitoneum and peritoneum: Secondary | ICD-10-CM | POA: Insufficient documentation

## 2023-11-03 DIAGNOSIS — C7802 Secondary malignant neoplasm of left lung: Secondary | ICD-10-CM | POA: Diagnosis not present

## 2023-11-03 DIAGNOSIS — C787 Secondary malignant neoplasm of liver and intrahepatic bile duct: Secondary | ICD-10-CM | POA: Insufficient documentation

## 2023-11-03 DIAGNOSIS — C538 Malignant neoplasm of overlapping sites of cervix uteri: Secondary | ICD-10-CM

## 2023-11-03 DIAGNOSIS — G622 Polyneuropathy due to other toxic agents: Secondary | ICD-10-CM

## 2023-11-03 DIAGNOSIS — Z808 Family history of malignant neoplasm of other organs or systems: Secondary | ICD-10-CM | POA: Insufficient documentation

## 2023-11-03 DIAGNOSIS — G629 Polyneuropathy, unspecified: Secondary | ICD-10-CM | POA: Insufficient documentation

## 2023-11-03 DIAGNOSIS — H189 Unspecified disorder of cornea: Secondary | ICD-10-CM | POA: Insufficient documentation

## 2023-11-03 DIAGNOSIS — H9042 Sensorineural hearing loss, unilateral, left ear, with unrestricted hearing on the contralateral side: Secondary | ICD-10-CM | POA: Diagnosis not present

## 2023-11-03 DIAGNOSIS — Z79899 Other long term (current) drug therapy: Secondary | ICD-10-CM | POA: Insufficient documentation

## 2023-11-03 DIAGNOSIS — Z801 Family history of malignant neoplasm of trachea, bronchus and lung: Secondary | ICD-10-CM | POA: Insufficient documentation

## 2023-11-03 DIAGNOSIS — C7801 Secondary malignant neoplasm of right lung: Secondary | ICD-10-CM | POA: Insufficient documentation

## 2023-11-03 DIAGNOSIS — I10 Essential (primary) hypertension: Secondary | ICD-10-CM | POA: Diagnosis not present

## 2023-11-03 DIAGNOSIS — F17210 Nicotine dependence, cigarettes, uncomplicated: Secondary | ICD-10-CM | POA: Diagnosis not present

## 2023-11-03 LAB — CBC WITH DIFFERENTIAL/PLATELET
Abs Immature Granulocytes: 0.56 10*3/uL — ABNORMAL HIGH (ref 0.00–0.07)
Basophils Absolute: 0 10*3/uL (ref 0.0–0.1)
Basophils Relative: 0 %
Eosinophils Absolute: 0 10*3/uL (ref 0.0–0.5)
Eosinophils Relative: 0 %
HCT: 27.4 % — ABNORMAL LOW (ref 36.0–46.0)
Hemoglobin: 9.3 g/dL — ABNORMAL LOW (ref 12.0–15.0)
Immature Granulocytes: 5 %
Lymphocytes Relative: 16 %
Lymphs Abs: 1.9 10*3/uL (ref 0.7–4.0)
MCH: 32.3 pg (ref 26.0–34.0)
MCHC: 33.9 g/dL (ref 30.0–36.0)
MCV: 95.1 fL (ref 80.0–100.0)
Monocytes Absolute: 1.1 10*3/uL — ABNORMAL HIGH (ref 0.1–1.0)
Monocytes Relative: 8 %
Neutro Abs: 8.9 10*3/uL — ABNORMAL HIGH (ref 1.7–7.7)
Neutrophils Relative %: 71 %
Platelets: 316 10*3/uL (ref 150–400)
RBC: 2.88 MIL/uL — ABNORMAL LOW (ref 3.87–5.11)
RDW: 16.4 % — ABNORMAL HIGH (ref 11.5–15.5)
WBC: 12.5 10*3/uL — ABNORMAL HIGH (ref 4.0–10.5)
nRBC: 0 % (ref 0.0–0.2)

## 2023-11-03 LAB — COMPREHENSIVE METABOLIC PANEL
ALT: 37 U/L (ref 0–44)
AST: 33 U/L (ref 15–41)
Albumin: 2.6 g/dL — ABNORMAL LOW (ref 3.5–5.0)
Alkaline Phosphatase: 63 U/L (ref 38–126)
Anion gap: 11 (ref 5–15)
BUN: 12 mg/dL (ref 6–20)
CO2: 23 mmol/L (ref 22–32)
Calcium: 8.6 mg/dL — ABNORMAL LOW (ref 8.9–10.3)
Chloride: 101 mmol/L (ref 98–111)
Creatinine, Ser: 0.92 mg/dL (ref 0.44–1.00)
GFR, Estimated: >60 mL/min (ref >60)
Glucose, Bld: 97 mg/dL (ref 70–99)
Potassium: 4.1 mmol/L (ref 3.5–5.1)
Sodium: 135 mmol/L (ref 135–145)
Total Bilirubin: 0.4 mg/dL (ref 0.0–1.2)
Total Protein: 6.3 g/dL — ABNORMAL LOW (ref 6.5–8.1)

## 2023-11-03 LAB — URINALYSIS, DIPSTICK ONLY
Bilirubin Urine: NEGATIVE
Glucose, UA: NEGATIVE mg/dL
Hgb urine dipstick: NEGATIVE
Ketones, ur: NEGATIVE mg/dL
Leukocytes,Ua: NEGATIVE
Nitrite: NEGATIVE
Protein, ur: NEGATIVE mg/dL
Specific Gravity, Urine: 1.01 (ref 1.005–1.030)
pH: 8 (ref 5.0–8.0)

## 2023-11-03 LAB — MAGNESIUM: Magnesium: 1.3 mg/dL — ABNORMAL LOW (ref 1.7–2.4)

## 2023-11-03 MED ORDER — DEXAMETHASONE 0.1 % OP SUSP
OPHTHALMIC | 11 refills | Status: DC
Start: 2023-11-03 — End: 2024-01-27

## 2023-11-03 MED ORDER — CARBOXYMETHYLCELLULOSE SOD PF 0.5 % OP SOLN: 2.0000 [drp] | Freq: Four times a day (QID) | OPHTHALMIC | 11 refills | Status: DC

## 2023-11-03 MED ORDER — SODIUM CHLORIDE FLUSH 0.9 % IV SOLN
10.0000 mL | Freq: Once | INTRAVENOUS | Status: AC
  Administered 2023-11-03: 10 mL via INTRAVENOUS
  Filled 2023-11-03: qty 10

## 2023-11-03 MED ORDER — PREGABALIN 200 MG PO CAPS: 200.0000 mg | ORAL_CAPSULE | Freq: Three times a day (TID) | ORAL | 0 refills | Status: DC

## 2023-11-03 MED ORDER — HEPARIN SOD (PORK) LOCK FLUSH 100 UNIT/ML IV SOLN
500.0000 [IU] | Freq: Once | INTRAVENOUS | Status: AC
  Administered 2023-11-03: 500 [IU] via INTRAVENOUS

## 2023-11-03 MED ORDER — OXYCODONE HCL 5 MG PO TABS: 5.0000 mg | ORAL_TABLET | Freq: Four times a day (QID) | ORAL | 0 refills | Status: DC | PRN

## 2023-11-03 MED ORDER — BRIMONIDINE TARTRATE 0.1 % OP SOLN: OPHTHALMIC | 11 refills | Status: DC

## 2023-11-03 MED ORDER — SODIUM CHLORIDE 0.9% FLUSH
10.0000 mL | INTRAVENOUS | Status: DC | PRN
  Administered 2023-11-03: 10 mL via INTRAVENOUS

## 2023-11-03 NOTE — Progress Notes (Signed)
 START OFF PATHWAY REGIMEN - Other   [id number]:Tisotumab vedotin 2 mg/kg IV D1 q21 Days:   A cycle is every 21 days:     Tisotumab vedotin-tftv   **Always confirm dose/schedule in your pharmacy ordering system**  Patient Characteristics: Intent of Therapy: Non-Curative / Palliative Intent, Discussed with Patient

## 2023-11-03 NOTE — Progress Notes (Signed)
 DISCONTINUE OFF PATHWAY REGIMEN - Other   [id number]:Bevacizumab 15 mg/kg IV D1 + Cisplatin 50 mg/m2 IV D1 + Paclitaxel 175 mg/m2 IV D1 + Pembrolizumab 200 mg IV D1 q21 Days:   A cycle is every 21 days:     Bevacizumab-xxxx      Pembrolizumab      Paclitaxel      Cisplatin   **Always confirm dose/schedule in your pharmacy ordering system**  PRIOR TREATMENT: Bevacizumab 15 mg/kg IV D1 + Cisplatin 50 mg/m2 IV D1 + Paclitaxel 175 mg/m2 IV D1 + Pembrolizumab 200 mg IV D1 q21 Days    Patient Characteristics:

## 2023-11-03 NOTE — Progress Notes (Signed)
 Patient and labs accessed by Dr. Ellin Saba, orders received to hold treatment today.  Port flushed with good blood return noted. No bruising or swelling at site. Bandaid applied and patient discharged in satisfactory condition. VVS stable with no signs or symptoms of distressed noted.

## 2023-11-03 NOTE — Patient Instructions (Signed)
 [facility] at [facility] Discharge Instructions   You were seen and examined today by Dr. Ellin Saba.  He reviewed the results of your lab work which are normal/stable.   He reviewed the results of your CT scan. It is showing the cancer has gotten worse. We will need to change treatment to get the cancer back under control.   We will proceed with your new treatment next week.   Return as scheduled.    Thank you for choosing [facility] at [facility] to provide your oncology and hematology care.  To afford each patient quality time with our provider, please arrive at least 15 minutes before your scheduled appointment time.   If you have a lab appointment with the Cancer Center please come in thru the Main Entrance and check in at the main information desk.  You need to re-schedule your appointment should you arrive 10 or more minutes late.  We strive to give you quality time with our providers, and arriving late affects you and other patients whose appointments are after yours.  Also, if you no show three or more times for appointments you may be dismissed from the clinic at the providers discretion.     Again, thank you for choosing [facility].  Our hope is that these requests will decrease the amount of time that you wait before being seen by our physicians.       _____________________________________________________________  Should you have questions after your visit to [facility], please contact our office at [phone] and follow the prompts.  Our office hours are 8:00 a.m. and 4:30 p.m. Monday - Friday.  Please note that voicemails left after 4:00 p.m. may not be returned until the following business day.  We are closed weekends and major holidays.  You do have access to a nurse 24-7, just call the main number to the clinic [phone] and do not press any options, hold on the line and a nurse will answer the  phone.    For prescription refill requests, have your pharmacy contact our office and allow 72 hours.    Due to Covid, you will need to wear a mask upon entering the hospital. If you do not have a mask, a mask will be given to you at the Main Entrance upon arrival. For doctor visits, patients may have 1 support person age 55 or older with them. For treatment visits, patients can not have anyone with them due to social distancing guidelines and our immunocompromised population.

## 2023-11-04 ENCOUNTER — Other Ambulatory Visit: Payer: Self-pay

## 2023-11-05 ENCOUNTER — Other Ambulatory Visit: Payer: Self-pay

## 2023-11-05 ENCOUNTER — Inpatient Hospital Stay: Payer: Medicaid Other | Attending: Hematology

## 2023-11-05 ENCOUNTER — Inpatient Hospital Stay: Payer: Medicaid Other

## 2023-11-05 ENCOUNTER — Inpatient Hospital Stay: Payer: Medicaid Other | Admitting: Hematology

## 2023-11-06 ENCOUNTER — Ambulatory Visit (HOSPITAL_COMMUNITY)
Admission: RE | Admit: 2023-11-06 | Discharge: 2023-11-06 | Disposition: A | Discharge status: Home / Self Care | Source: Ambulatory Visit | Attending: Hematology | Admitting: Hematology

## 2023-11-06 ENCOUNTER — Telehealth: Payer: Self-pay | Admitting: Internal Medicine

## 2023-11-06 DIAGNOSIS — J329 Chronic sinusitis, unspecified: Secondary | ICD-10-CM | POA: Diagnosis not present

## 2023-11-06 DIAGNOSIS — C539 Malignant neoplasm of cervix uteri, unspecified: Secondary | ICD-10-CM | POA: Diagnosis not present

## 2023-11-06 DIAGNOSIS — H9042 Sensorineural hearing loss, unilateral, left ear, with unrestricted hearing on the contralateral side: Secondary | ICD-10-CM | POA: Insufficient documentation

## 2023-11-06 DIAGNOSIS — Z7689 Persons encountering health services in other specified circumstances: Secondary | ICD-10-CM | POA: Diagnosis not present

## 2023-11-06 DIAGNOSIS — H9192 Unspecified hearing loss, left ear: Secondary | ICD-10-CM | POA: Diagnosis not present

## 2023-11-06 MED ORDER — GADOBUTROL 1 MMOL/ML IV SOLN
7.5000 mL | Freq: Once | INTRAVENOUS | Status: AC | PRN
  Administered 2023-11-06: 7.5 mL via INTRAVENOUS

## 2023-11-06 NOTE — Telephone Encounter (Signed)
PCS  Noted  Copied Sleeved  Original in PCP box Copy front desk folder

## 2023-11-09 ENCOUNTER — Ambulatory Visit: Payer: Medicaid Other | Admitting: Internal Medicine

## 2023-11-09 ENCOUNTER — Encounter: Payer: Self-pay | Admitting: Internal Medicine

## 2023-11-09 VITALS — BP 135/78 | HR 121 | Wt 173.0 lb

## 2023-11-09 DIAGNOSIS — J441 Chronic obstructive pulmonary disease with (acute) exacerbation: Secondary | ICD-10-CM | POA: Diagnosis not present

## 2023-11-09 DIAGNOSIS — Z7689 Persons encountering health services in other specified circumstances: Secondary | ICD-10-CM | POA: Diagnosis not present

## 2023-11-09 NOTE — Assessment & Plan Note (Signed)
 Presenting today for hospital follow-up.  As noted above, recent hospital admission 2/23 - 2/26 in the setting of COPD exacerbation with acute hypoxic respiratory failure attributed to influenza A.  Treated with steroids and bronchodilators.  She was weaned off supplemental oxygen prior to discharge.  Pulmonary exam today is unremarkable.  She is using Symbicort twice daily and Xopenex as needed, which she says is infrequent.  No further treatment indicated today.  She will return to care for previously scheduled follow-up on 3/24.

## 2023-11-09 NOTE — Progress Notes (Signed)
 Established Patient Office Visit  Subjective   Patient ID: Kristin Carson, female    DOB: 1970-09-12  Age: 53 y.o. MRN: [id number]  Chief Complaint  Patient presents with   Hospitalization Follow-up    Hospital Follow UP   Kristin Carson returns to care today for hospital follow-up.  Recent hospital admission 2/23 - 2/26 after presenting to the emergency department endorsing a 6-day history of productive cough and increasing shortness of breath.  She was hypoxic in ED, requiring up to 6 lpm [employer] of supplemental oxygen.  Ultimately admitted for acute hypoxic respiratory failure in the setting of COPD exacerbation attributed to influenza A.  Treated with steroids and bronchodilators and responded well.  Discharged 2/26.  In the interim she has been seen by oncology for follow-up.  There have otherwise been no acute interval events.  Kristin Carson reports feeling well today.  She does not have any acute concerns to discuss.  She endorses left ear discomfort but is otherwise asymptomatic.  Past Medical History:  Diagnosis Date   Anemia    Asthma    Bronchitis    Cancer (HCC)    Cervical   CHF (congestive heart failure) (HCC)    COPD (chronic obstructive pulmonary disease) (HCC)    Depression    GERD (gastroesophageal reflux disease)    Headache    Hypertension    Port-A-Cath in place 09/25/2022   Past Surgical History:  Procedure Laterality Date   BREAST SURGERY     CESAREAN SECTION     IR IMAGING GUIDED PORT INSERTION  09/30/2022   LEG SURGERY     Social History   Tobacco Use   Smoking status: Every Day    Current packs/day: 0.25    Average packs/day: 0.3 packs/day for 23.0 years (5.8 ttl pk-yrs)    Types: Cigarettes   Smokeless tobacco: Never  Vaping Use   Vaping status: Never Used  Substance Use Topics   Alcohol use: Yes    Comment: occ   Drug use: Yes    Types: Marijuana   Family History  Problem Relation Age of Onset   Dermatomyositis Mother    Hypertension Mother    Cancer  Mother        lung   Cancer Father        lung   Heart disease Father    Lung cancer Paternal Uncle    Lung cancer Maternal Uncle    Colon cancer Neg Hx    Breast cancer Neg Hx    Ovarian cancer Neg Hx    Endometrial cancer Neg Hx    Pancreatic cancer Neg Hx    Prostate cancer Neg Hx    Allergies  Allergen Reactions   Carrot [Daucus Carota] Hives   Lisinopril-Hydrochlorothiazide     Oral swelling   Ibuprofen Other (See Comments)    Oral swelling   Other Itching and Other (See Comments)    Hair dye, blisters, pus-filled, soreness   Tramadol Hives   Erythromycin Hives   Review of Systems  Constitutional:  Negative for chills and fever.  HENT:  Positive for ear pain (Left ear). Negative for sore throat.   Respiratory:  Negative for cough and shortness of breath.   Cardiovascular:  Negative for chest pain, palpitations and leg swelling.  Gastrointestinal:  Negative for abdominal pain, blood in stool, constipation, diarrhea, nausea and vomiting.  Genitourinary:  Negative for dysuria and hematuria.  Musculoskeletal:  Negative for myalgias.  Skin:  Negative for itching  and rash.  Neurological:  Negative for dizziness and headaches.  Psychiatric/Behavioral:  Negative for depression and suicidal ideas.      Objective:     BP 135/78 (BP Location: Right Arm, Patient Position: Sitting, Cuff Size: Normal)   Pulse (!) 121   Wt 173 lb (78.5 kg)   LMP 08/15/2022 (Approximate)   SpO2 95%   BMI 27.92 kg/m  BP Readings from Last 3 Encounters:  11/09/23 135/78  11/03/23 (!) 149/75  10/28/23 (!) 151/90   Physical Exam Vitals reviewed.  Constitutional:      General: She is not in acute distress.    Appearance: Normal appearance. She is not toxic-appearing.  HENT:     Head: Normocephalic and atraumatic.     Right Ear: External ear normal.     Left Ear: External ear normal. There is impacted cerumen.     Nose: Nose normal. No congestion or rhinorrhea.     Mouth/Throat:      Mouth: Mucous membranes are moist.     Pharynx: Oropharynx is clear. No oropharyngeal exudate or posterior oropharyngeal erythema.  Eyes:     General: No scleral icterus.    Extraocular Movements: Extraocular movements intact.     Conjunctiva/sclera: Conjunctivae normal.     Pupils: Pupils are equal, round, and reactive to light.  Cardiovascular:     Rate and Rhythm: Normal rate and regular rhythm.     Pulses: Normal pulses.     Heart sounds: Normal heart sounds. No murmur heard.    No friction rub. No gallop.  Pulmonary:     Effort: Pulmonary effort is normal.     Breath sounds: Normal breath sounds. No wheezing, rhonchi or rales.  Abdominal:     General: Abdomen is flat. Bowel sounds are normal. There is no distension.     Palpations: Abdomen is soft.     Tenderness: There is no abdominal tenderness.  Musculoskeletal:        General: No swelling. Normal range of motion.     Cervical back: Normal range of motion.     Right lower leg: No edema.     Left lower leg: No edema.  Lymphadenopathy:     Cervical: No cervical adenopathy.  Skin:    General: Skin is warm and dry.     Capillary Refill: Capillary refill takes less than 2 seconds.     Coloration: Skin is not jaundiced.  Neurological:     General: No focal deficit present.     Mental Status: She is alert and oriented to person, place, and time.  Psychiatric:        Mood and Affect: Mood normal.        Behavior: Behavior normal.   Last CBC Lab Results  Component Value Date   WBC 12.5 (H) 11/03/2023   HGB 9.3 (L) 11/03/2023   HCT 27.4 (L) 11/03/2023   MCV 95.1 11/03/2023   MCH 32.3 11/03/2023   RDW 16.4 (H) 11/03/2023   PLT 316 11/03/2023   Last metabolic panel Lab Results  Component Value Date   GLUCOSE 97 11/03/2023   NA 135 11/03/2023   K 4.1 11/03/2023   CL 101 11/03/2023   CO2 23 11/03/2023   BUN 12 11/03/2023   CREATININE 0.92 11/03/2023   GFRNONAA >60 11/03/2023   CALCIUM 8.6 (L) 11/03/2023   PHOS  3.8 10/26/2023   PROT 6.3 (L) 11/03/2023   ALBUMIN 2.6 (L) 11/03/2023   BILITOT 0.4 11/03/2023   ALKPHOS 63 11/03/2023  AST 33 11/03/2023   ALT 37 11/03/2023   ANIONGAP 11 11/03/2023   Last lipids Lab Results  Component Value Date   CHOL 155 11/25/2016   HDL 55 11/25/2016   LDLCALC 87 11/25/2016   TRIG 63 11/25/2016   CHOLHDL 2.8 11/25/2016   Last hemoglobin A1c Lab Results  Component Value Date   HGBA1C 5.3 08/29/2022   Last thyroid functions Lab Results  Component Value Date   TSH 5.325 (H) 03/30/2023     Assessment & Plan:   Problem List Items Addressed This Visit       COPD exacerbation (HCC) - Primary   Presenting today for hospital follow-up.  As noted above, recent hospital admission 2/23 - 2/26 in the setting of COPD exacerbation with acute hypoxic respiratory failure attributed to influenza A.  Treated with steroids and bronchodilators.  She was weaned off supplemental oxygen prior to discharge.  Pulmonary exam today is unremarkable.  She is using Symbicort twice daily and Xopenex as needed, which she says is infrequent.  No further treatment indicated today.  She will return to care for previously scheduled follow-up on 3/24.       Return in about 2 weeks (around 11/23/2023).    Billie Lade, MD

## 2023-11-09 NOTE — Progress Notes (Signed)
 Pharmacist Chemotherapy Monitoring - Initial Assessment    Anticipated start date: 11/12/23   The following has been reviewed per standard work regarding the patient's treatment regimen: The patient's diagnosis, treatment plan and drug doses, and organ/hematologic function Lab orders and baseline tests specific to treatment regimen  The treatment plan start date, drug sequencing, and pre-medications Prior authorization status  Patient's documented medication list, including drug-drug interaction screen and prescriptions for anti-emetics and supportive care specific to the treatment regimen The drug concentrations, fluid compatibility, administration routes, and timing of the medications to be used The patient's access for treatment and lifetime cumulative dose history, if applicable  The patient's medication allergies and previous infusion related reactions, if applicable   Changes made to treatment plan:  N/A  Follow up needed:  N/A  Will monitor for Ophthalmology appt release Pharmacy - has eye drops in case does not bring her own in.    Stephens Shire, RPH, 11/09/2023  2:05 PM

## 2023-11-09 NOTE — Patient Instructions (Signed)
 It was a pleasure to see you today.  Thank you for giving [place] the opportunity to be involved in your care.  Below is a brief recap of your visit and next steps.  We will plan to see you again on 3/24.  [facility] follow [facility] today No medication changes Follow up as scheduled on 3/24.

## 2023-11-09 NOTE — Addendum Note (Signed)
 Addended by: Billie Lade on: 11/09/2023 04:35 PM   Modules accepted: Level of Service

## 2023-11-09 NOTE — Telephone Encounter (Signed)
 Lvm forms complete ready for pick up

## 2023-11-10 ENCOUNTER — Other Ambulatory Visit: Payer: Self-pay

## 2023-11-10 NOTE — Telephone Encounter (Signed)
 Patient picked up forms.

## 2023-11-11 ENCOUNTER — Other Ambulatory Visit: Payer: Self-pay

## 2023-11-11 DIAGNOSIS — Z7689 Persons encountering health services in other specified circumstances: Secondary | ICD-10-CM | POA: Diagnosis not present

## 2023-11-11 DIAGNOSIS — C538 Malignant neoplasm of overlapping sites of cervix uteri: Secondary | ICD-10-CM
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
## 2023-11-11 IMAGING — DX DG CHEST 2V
2 series · 2 of 2 positions shown · non-contrast
Comparison: None.

CLINICAL DATA: CP

EXAM:
CHEST - 2 VIEW

[chest pa]
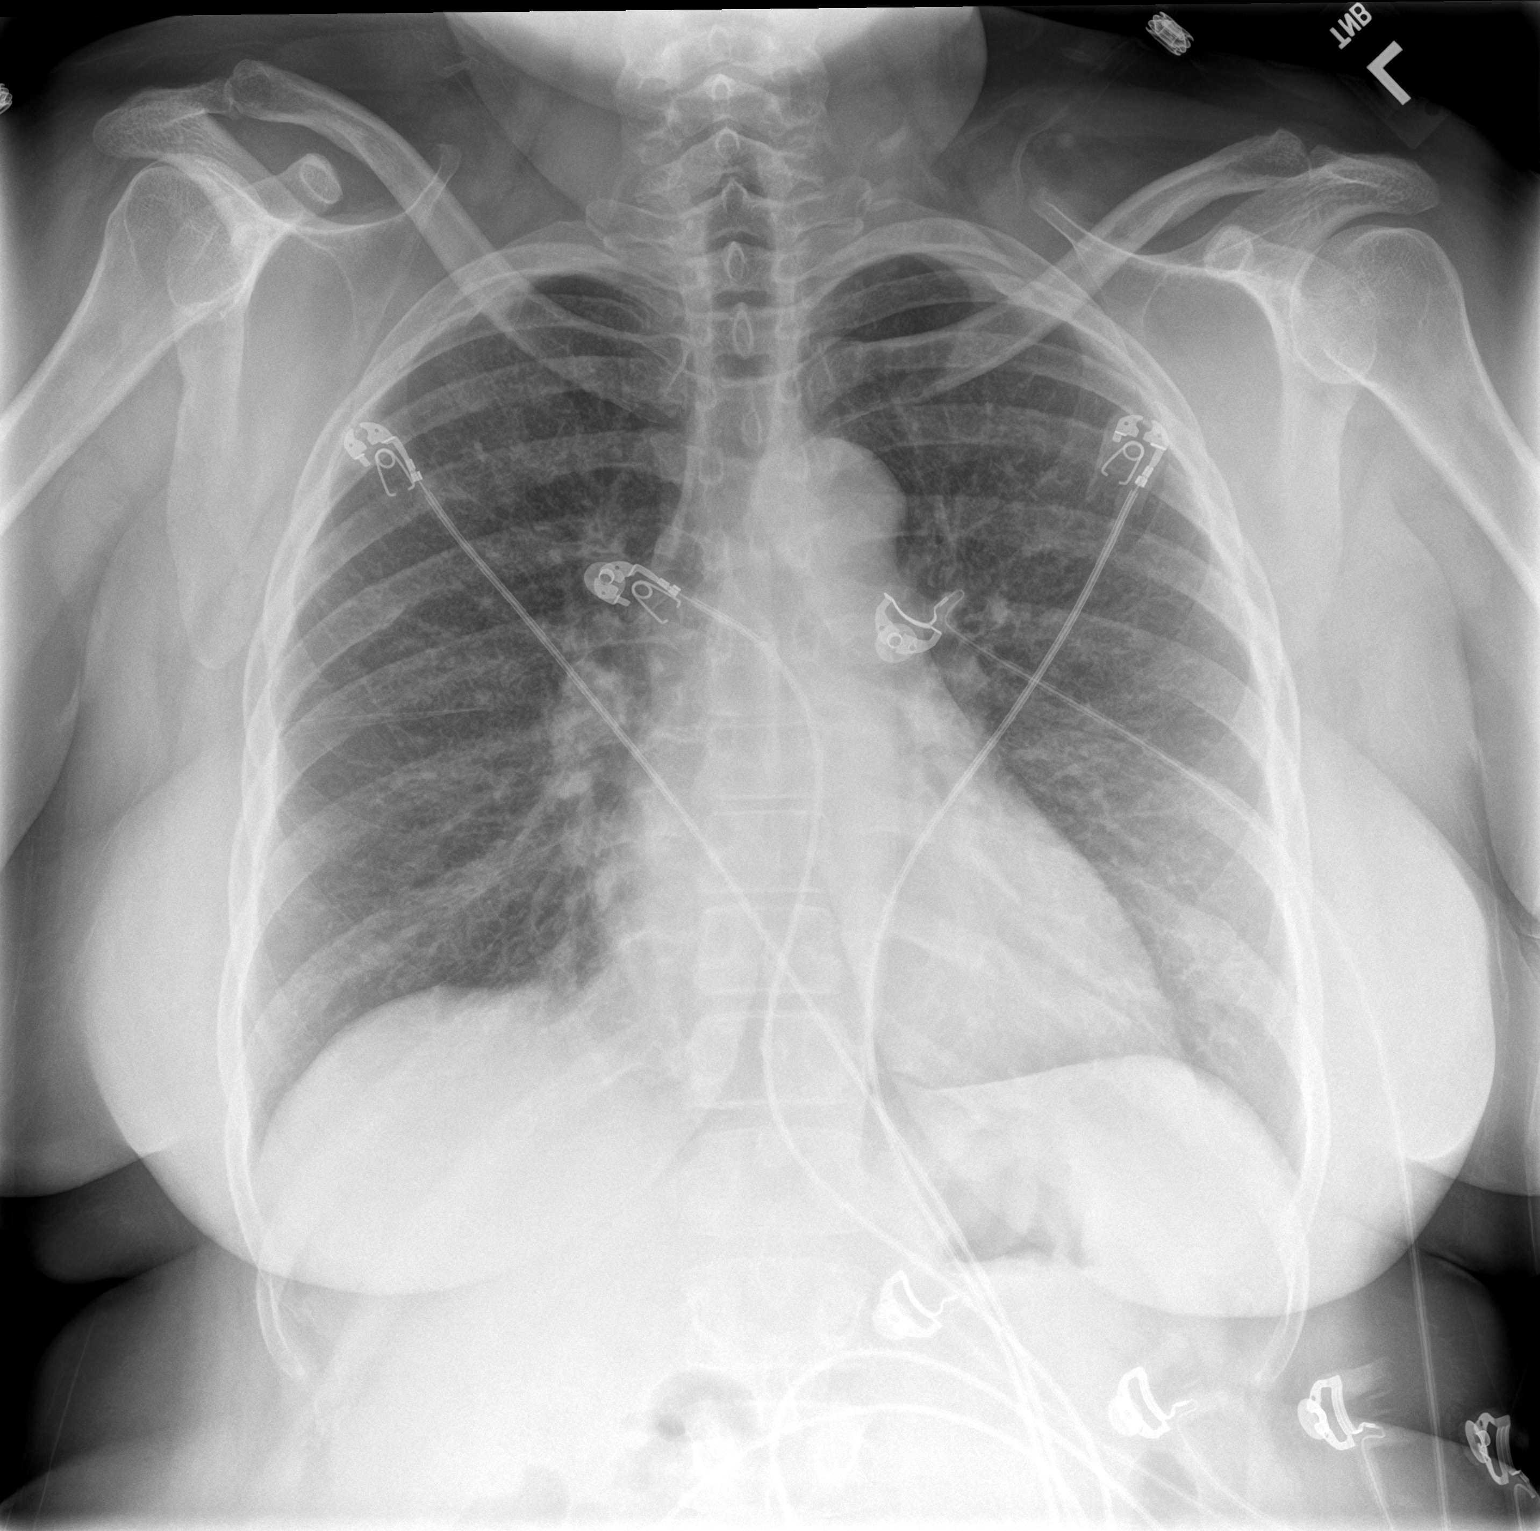

[chest lat]
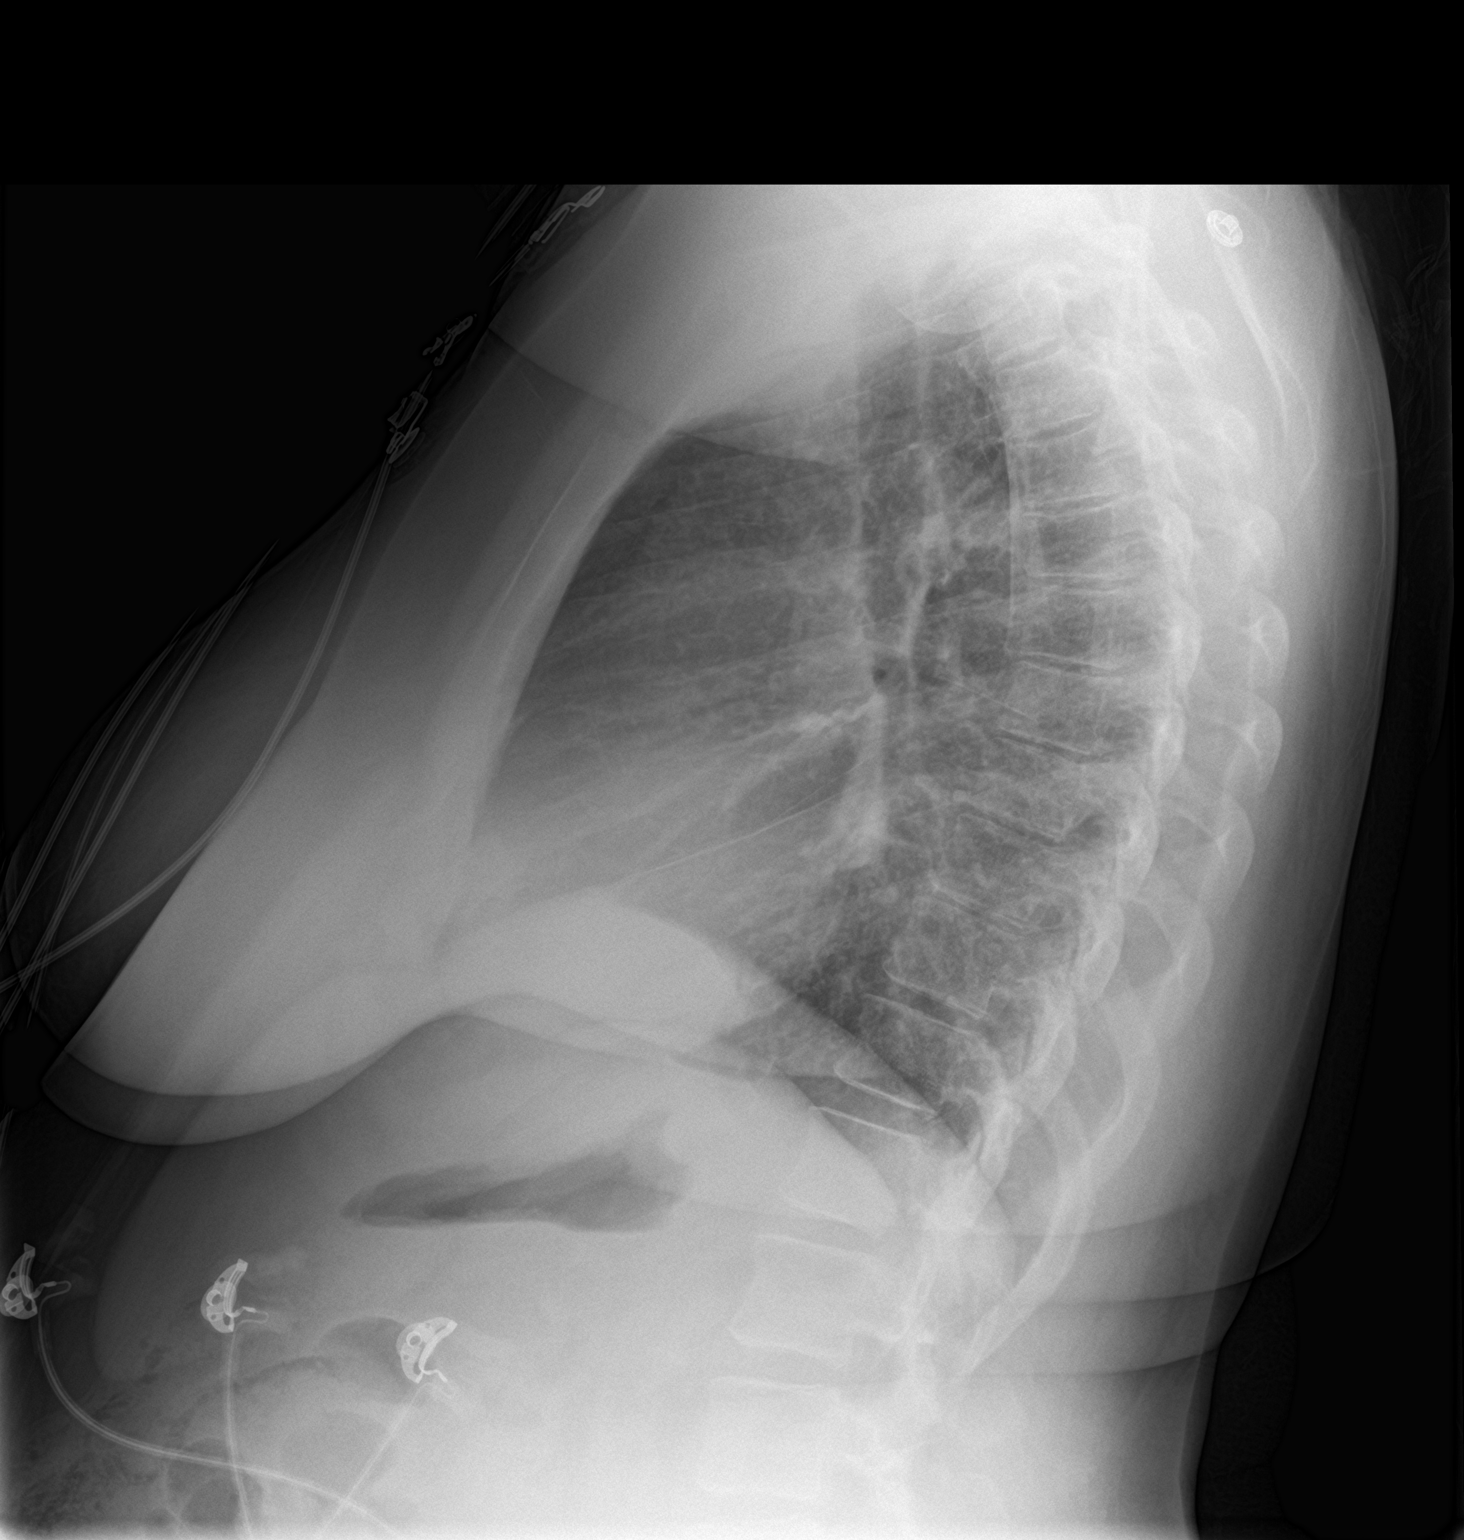

[2 of 2 positions shown; findings below may reference images not displayed]

FINDINGS: The cardiomediastinal silhouette is within normal limits. No pleural
effusion. No pneumothorax. No mass or consolidation. No acute
osseous abnormality.
IMPRESSION: No acute findings in the chest.

## 2023-11-11 NOTE — Progress Notes (Signed)
 [facility] [address], [name]    Clinic Day:  11/12/23     Referring physician: Billie Lade, MD  Patient Care Team: Billie Lade, MD as PCP - General (Internal Medicine) Doreatha Massed, MD as Medical Oncologist (Medical Oncology) Therese Sarah, RN as Oncology Nurse Navigator (Medical Oncology)   ASSESSMENT & PLAN:   Assessment: 1.  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 - CTAP (08/30/2022): Irregularity of the endometrium extending through the cervix with foci of gas in the cervix.  Multifocal omental and peritoneal metastasis.  Metastatic left periaortic, aortocaval, left external iliac and right common iliac lymph nodes.  Multiple bilateral lung nodules at bases.  12 mm hypodensity in the liver near the falciform ligament. - Endometrial biopsy (09/09/2022): Invasive moderately to poorly differentiated squamous cell carcinoma with abundant necrosis.  Cervix biopsy: Invasive and in situ moderate to poorly differentiated squamous cell carcinoma.  High risk HPV positive. - NGS: PD-L1 (22 C3): CPS 2, HER2 IHC negative.  PIK3CA exon 10 pathogenic variant.  MS-stable.  TMB-low.  FANCM and SDHA pathogenic variants. - PD-L1 CPS: 8% - Cycle 1 of cisplatin, paclitaxel, bevacizumab on 10/02/2022, pembrolizumab added with cycle 2 on 10/23/2022 through 09/17/2023 (cycle 12) discontinued for progression. - CT CAP (10/18/2022): Increased size and number of bilateral lung nodules.  New subtle hepatic hypodensity suspicious for metastatic disease.  Enlarged left periportal lymph node. 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  Prescribed Restasis, Pred forte 1% ophthalmic suspension   2.  Social/family history: - She is  married and lives at home with her husband and roommate.  She has never worked.  She is current active smoker, half pack per day, for the last 28 years.  She occasionally smokes marijuana. - Mother died of lung cancer.  Father had head and neck cancer.  Maternal grandmother had cancer.  Paternal uncle died of cancer.  Maternal uncle also had cancer.    Plan: 1.  Metastatic cervical cancer to the lungs, peritoneum and liver: - Recent CT scan on 10/19/2023 showed progression. - I have reviewed baseline eye exam findings which showed mild exposure keratopathy on 11/11/2023. - We talked about Tivdak as next line option and side effects including GI side effects, LFT abnormalities, eye changes.  She will continue to have eye exam prior to each treatment for the first 9 cycles. - Labs today: Normal LFTs with albumin 2.9.  Creatinine normal.  Hemoglobin is 8.6.  White count and platelet count is normal.  She may proceed with her cycle 1 today.  RTC 3 weeks for follow-up.   2.  Normocytic anemia: - Anemia likely from myelosuppression.  Hemoglobin today is 8.6.  Will check ferritin and iron panel.   3.  Peripheral neuropathy: - Continue Lyrica 3 mg 3 times daily.  Continue oxycodone 5 mg every 6 hours as needed.   4.  Hypertension: - Blood pressure is 138/86.  Continue amlodipine 10 mg daily.  She did not take her pill today.  5.  Hypomagnesemia: - Magnesium is low at 1.4.  She will receive IV magnesium.  Will start her on magnesium 3 times daily.        Orders Placed This Encounter  Procedures   Iron  and TIBC ([facility] ONLY)   Ferritin      I,Helena R Teague,acting as a scribe for Doreatha Massed, MD.,have documented all relevant documentation on the behalf of Doreatha Massed, MD,as directed by  Doreatha Massed, MD while in the presence of Doreatha Massed, MD.  I, Doreatha Massed MD, have reviewed the above documentation for accuracy and completeness,  and I agree with the above.     Doreatha Massed, MD   3/13/202510:14 AM  CHIEF COMPLAINT:   Diagnosis: metastatic cervical squamous cell carcinoma    Cancer Staging  Cervical cancer [facility]) Staging form: Cervix Uteri, AJCC Version 9 - Clinical stage from 09/19/2022: FIGO Stage IVB (cT4, cN2a, cM1) - Unsigned    Prior Therapy: none  Current Therapy:  Cisplatin + Paclitaxel + Bevacizumab, Keytruda   HISTORY OF PRESENT ILLNESS:   Oncology History Overview Note  PD-L1 CPS 8%   Cervical cancer (HCC)  09/19/2022 Initial Diagnosis   Cervical cancer (HCC)   10/02/2022 - 09/17/2023 Chemotherapy   [name]     10/29/2023 Genetic Testing   Negative genetic testing on the CancerNext+RNAinsight.  The report date is 10/29/2023.  The [facility] CancerNext+RNAinsight Panel includes sequencing, rearrangement analysis, and RNA analysis for the following 39 genes: APC, ATM, BAP1, BARD1, BMPR1A, BRCA1, BRCA2, BRIP1, CDH1, CDKN2A, CHEK2, FH, FLCN, MET, MLH1, MSH2, MSH6, MUTYH, NF1, NTHL1, PALB2, PMS2, PTEN, RAD51C, RAD51D, SMAD4, STK11, TP53, TSC1, TSC2, and VHL (sequencing and deletion/duplication); AXIN2, HOXB13, MBD4, MSH3, POLD1 and POLE (sequencing only); EPCAM and GREM1 (deletion/duplication only).   11/12/2023 -  Chemotherapy   Patient is on Treatment Plan : CERVICAL Tisotumab vedotin-tftv (Tivdak) q21d     Uterine cancer (HCC)  10/15/2023 Initial Diagnosis   Uterine cancer (HCC)   10/29/2023 Genetic Testing   Negative genetic testing on the CancerNext+RNAinsight.  The report date is 10/29/2023.  The [facility] CancerNext+RNAinsight Panel includes sequencing, rearrangement analysis, and RNA analysis for the following 39 genes: APC, ATM, BAP1, BARD1, BMPR1A, BRCA1, BRCA2, BRIP1, CDH1, CDKN2A, CHEK2, FH, FLCN, MET, MLH1, MSH2, MSH6, MUTYH, NF1, NTHL1, PALB2, PMS2, PTEN, RAD51C, RAD51D,  SMAD4, STK11, TP53, TSC1, TSC2, and VHL (sequencing and deletion/duplication); AXIN2, HOXB13, MBD4, MSH3, POLD1 and POLE (sequencing only); EPCAM and GREM1 (deletion/duplication only).      INTERVAL HISTORY:   Kristin Carson is a 53 y.o. female presenting to clinic today for follow up of metastatic cervical squamous cell carcinoma. She was last seen by me on 11/03/23.  Since her last visit, she underwent brain MRI on 11/06/23.  Today, she states that she is doing well overall. Her appetite level is at 100%. Her energy level is at 75%. She is accompanied by a friend.  Since her last visit, Lam was seen and prescribed eye drops by ophthalmology, but has not yet had the chance to pick them up. She has not yet put in dexamethasone eye drops, prescribed by me. She has used the alphagan eye drops this morning.   Kelyn does not take any magnesium supplements, though she has in the past without any side effects. She is taking lyrica and amlodipine as prescribed, though she has not yet taken amlodipine today.   PAST MEDICAL HISTORY:   Past Medical History: Past Medical History:  Diagnosis Date   Anemia    Asthma    Bronchitis    Cancer (HCC)    Cervical   CHF (congestive heart  failure) (HCC)    COPD (chronic obstructive pulmonary disease) (HCC)    Depression    GERD (gastroesophageal reflux disease)    Headache    Hypertension    Port-A-Cath in place 09/25/2022    Surgical History: Past Surgical History:  Procedure Laterality Date   BREAST SURGERY     CESAREAN SECTION     IR IMAGING GUIDED PORT INSERTION  09/30/2022   LEG SURGERY      Social History: Social History   Socioeconomic History   Marital status: Married    Spouse name: Not on file   Number of children: Not on file   Years of education: Not on file   Highest education level: Not on file  Occupational History   Not on file  Tobacco Use   Smoking status: Every Day    Current packs/day: 0.25    Average packs/day: 0.3  packs/day for 23.0 years (5.8 ttl pk-yrs)    Types: Cigarettes   Smokeless tobacco: Never  Vaping Use   Vaping status: Never Used  Substance and Sexual Activity   Alcohol use: Yes    Comment: occ   Drug use: Yes    Types: Marijuana   Sexual activity: Yes    Birth control/protection: None  Other Topics Concern   Not on file  Social History Narrative   Not on file   Social Drivers of Health   Financial Resource Strain: Low Risk  (09/09/2022)   Overall Financial Resource Strain (CARDIA)    Difficulty of Paying Living Expenses: Not hard at all  Food Insecurity: No Food Insecurity (10/26/2023)   Hunger Vital Sign    Worried About Running Out of Food in the Last Year: Never true    Ran Out of Food in the Last Year: Never true  Transportation Needs: No Transportation Needs (10/26/2023)   PRAPARE - Administrator, Civil Service (Medical): No    Lack of Transportation (Non-Medical): No  Physical Activity: Insufficiently Active (09/09/2022)   Exercise Vital Sign    Days of Exercise per Week: 2 days    Minutes of Exercise per Session: 20 min  Stress: No Stress Concern Present (09/09/2022)   [employer] of Occupational Health - Occupational Stress Questionnaire    Feeling of Stress : Only a little  Social Connections: Socially Isolated (09/09/2022)   Social Connection and Isolation Panel [NHANES]    Frequency of Communication with Friends and Family: Three times a week    Frequency of Social Gatherings with Friends and Family: More than three times a week    Attends Religious Services: Never    Database administrator or Organizations: No    Attends Banker Meetings: Never    Marital Status: Separated  Intimate Partner Violence: Not At Risk (10/26/2023)   Humiliation, Afraid, Rape, and Kick questionnaire    Fear of Current or Ex-Partner: No    Emotionally Abused: No    Physically Abused: No    Sexually Abused: No    Family History: Family History  Problem  Relation Age of Onset   Dermatomyositis Mother    Hypertension Mother    Cancer Mother        lung   Cancer Father        lung   Heart disease Father    Lung cancer Paternal Uncle    Lung cancer Maternal Uncle    Colon cancer Neg Hx    Breast cancer Neg Hx    Ovarian  cancer Neg Hx    Endometrial cancer Neg Hx    Pancreatic cancer Neg Hx    Prostate cancer Neg Hx     Current Medications:  Current Outpatient Medications:    acetaminophen (TYLENOL) 500 MG tablet, Take 2 tablets (1,000 mg total) by mouth every 8 (eight) hours as needed for mild pain (pain score 1-3), fever or headache., Disp: , Rfl:    amLODipine (NORVASC) 10 MG tablet, Take 1 tablet (10 mg total) by mouth daily., Disp: 30 tablet, Rfl: 3   brimonidine (ALPHAGAN P) 0.1 % SOLN, Place 3 drops into both eyes immediately prior to start of Tivdak infusion, Disp: 5 mL, Rfl: 11   budesonide-formoterol (SYMBICORT) 160-4.5 MCG/ACT inhaler, Inhale 2 puffs into the lungs in the morning and at bedtime., Disp: 1 each, Rfl: 3   Carboxymethylcellulose Sod PF 0.5 % SOLN, Place 2 drops into both eyes 4 (four) times daily. Continue for 30 days after last dose of Tivdak, Disp: 30 each, Rfl: 11   cetirizine (ZYRTEC ALLERGY) 10 MG tablet, Take 1 tablet (10 mg total) by mouth daily., Disp: 30 tablet, Rfl: 1   dexamethasone (DECADRON) 0.1 % ophthalmic suspension, Instill 1 drop into both eyes immediately prior to Tivdak and in the morning, in the evening and before bedtime for 72 hours after infusion, Disp: 5 mL, Rfl: 11   levalbuterol (XOPENEX HFA) 45 MCG/ACT inhaler, Inhale 2 puffs into the lungs every 8 (eight) hours as needed for wheezing or shortness of breath., Disp: 1 each, Rfl: 2   magnesium oxide (MAG-OX) 400 (240 Mg) MG tablet, Take 1 tablet (400 mg total) by mouth 3 (three) times daily., Disp: 90 tablet, Rfl: 3   megestrol (MEGACE) 400 MG/10ML suspension, Take 10 mLs (400 mg total) by mouth 2 (two) times daily., Disp: 480 mL, Rfl: 3    oxyCODONE (OXY IR/ROXICODONE) 5 MG immediate release tablet, Take 1 tablet (5 mg total) by mouth every 6 (six) hours as needed for severe pain (pain score 7-10)., Disp: 90 tablet, Rfl: 0   pantoprazole (PROTONIX) 40 MG tablet, Take 1 tablet (40 mg total) by mouth daily., Disp: 90 tablet, Rfl: 1   predniSONE (DELTASONE) 20 MG tablet, Take 3 tablets by mouth daily x 1 day; then 2 tablet by mouth daily x 2 days; then 1 tablet by mouth daily x 3 days; then half tablet by mouth daily x 3 days and stop prednisone., Disp: 12 tablet, Rfl: 0   pregabalin (LYRICA) 200 MG capsule, Take 1 capsule (200 mg total) by mouth 3 (three) times daily., Disp: 180 capsule, Rfl: 0 No current facility-administered medications for this visit.  Facility-Administered Medications Ordered in Other Visits:    0.9 %  sodium chloride infusion, , Intravenous, Continuous, Doreatha Massed, MD, Last Rate: 10 mL/hr at 11/12/23 0925, New Bag at 11/12/23 0925   heparin lock flush 100 unit/mL, 500 Units, Intracatheter, Once PRN, Doreatha Massed, MD   magnesium sulfate IVPB 4 g 100 mL, 4 g, Intravenous, Once, Doreatha Massed, MD, Last Rate: 100 mL/hr at 11/12/23 0943, 4 g at 11/12/23 0943   sodium chloride flush (NS) 0.9 % injection 10 mL, 10 mL, Intracatheter, PRN, Doreatha Massed, MD   tisotumab vedotin-tftv (TIVDAK) 160 mg in sodium chloride 0.9 % 100 mL (1.3793 mg/mL) chemo infusion, 2 mg/kg (Treatment Plan Recorded), Intravenous, Once, Doreatha Massed, MD   Allergies: Allergies  Allergen Reactions   Carrot [Daucus Carota] Hives   Lisinopril-Hydrochlorothiazide     Oral swelling  Ibuprofen Other (See Comments)    Oral swelling   Other Itching and Other (See Comments)    Hair dye, blisters, pus-filled, soreness   Tramadol Hives   Erythromycin Hives    REVIEW OF SYSTEMS:   Review of Systems  Constitutional:  Negative for chills, fatigue and fever.  HENT:   Negative for lump/mass, mouth sores,  nosebleeds, sore throat and trouble swallowing.   Eyes:  Negative for eye problems.  Respiratory:  Negative for cough and shortness of breath.   Cardiovascular:  Negative for chest pain, leg swelling and palpitations.  Gastrointestinal:  Negative for abdominal pain, constipation, diarrhea, nausea and vomiting.  Genitourinary:  Negative for bladder incontinence, difficulty urinating, dysuria, frequency, hematuria and nocturia.   Musculoskeletal:  Negative for arthralgias, back pain, flank pain, myalgias and neck pain.  Skin:  Negative for itching and rash.  Neurological:  Negative for dizziness, headaches and numbness.  Hematological:  Does not bruise/bleed easily.  Psychiatric/Behavioral:  Negative for depression, sleep disturbance and suicidal ideas. The patient is not nervous/anxious.   All other systems reviewed and are negative.    VITALS:   Weight 166 lb 7.2 oz (75.5 kg), last menstrual period 08/15/2022.  Wt Readings from Last 3 Encounters:  11/12/23 166 lb 7.2 oz (75.5 kg)  11/09/23 173 lb (78.5 kg)  11/03/23 168 lb 3.4 oz (76.3 kg)    Body mass index is 26.87 kg/m.  Performance status (ECOG): 1 - Symptomatic but completely ambulatory  PHYSICAL EXAM:   Physical Exam Vitals and nursing note reviewed. Exam conducted with a chaperone present.  Constitutional:      Appearance: Normal appearance.  Cardiovascular:     Rate and Rhythm: Normal rate and regular rhythm.     Pulses: Normal pulses.     Heart sounds: Normal heart sounds.  Pulmonary:     Effort: Pulmonary effort is normal.     Breath sounds: Normal breath sounds.  Abdominal:     Palpations: Abdomen is soft. There is no hepatomegaly, splenomegaly or mass.     Tenderness: There is no abdominal tenderness.  Musculoskeletal:     Right lower leg: No edema.     Left lower leg: No edema.  Lymphadenopathy:     Cervical: No cervical adenopathy.     Right cervical: No superficial, deep or posterior cervical  adenopathy.    Left cervical: No superficial, deep or posterior cervical adenopathy.     Upper Body:     Right upper body: No supraclavicular or axillary adenopathy.     Left upper body: No supraclavicular or axillary adenopathy.  Neurological:     General: No focal deficit present.     Mental Status: She is alert and oriented to person, place, and time.  Psychiatric:        Mood and Affect: Mood normal.        Behavior: Behavior normal.     LABS:      Latest Ref Rng & Units 11/12/2023    8:16 AM 11/03/2023    8:17 AM 10/26/2023    3:42 AM  CBC  WBC 4.0 - 10.5 K/uL 7.2  12.5  5.9   Hemoglobin 12.0 - 15.0 g/dL 8.6  9.3  56.4   Hematocrit 36.0 - 46.0 % 26.2  27.4  31.6   Platelets 150 - 400 K/uL 243  316  242       Latest Ref Rng & Units 11/12/2023    8:16 AM 11/03/2023    8:17  AM 10/28/2023    3:48 AM  CMP  Glucose 70 - 99 mg/dL 83  97  308   BUN 6 - 20 mg/dL 9  12  32   Creatinine 0.44 - 1.00 mg/dL 6.57  8.46  9.62   Sodium 135 - 145 mmol/L 138  135  134   Potassium 3.5 - 5.1 mmol/L 3.7  4.1  4.0   Chloride 98 - 111 mmol/L 104  101  102   CO2 22 - 32 mmol/L 23  23  22    Calcium 8.9 - 10.3 mg/dL 8.7  8.6  8.3   Total Protein 6.5 - 8.1 g/dL 6.8  6.3    Total Bilirubin 0.0 - 1.2 mg/dL 0.4  0.4    Alkaline Phos 38 - 126 U/L 60  63    AST 15 - 41 U/L 14  33    ALT 0 - 44 U/L 13  37       No results found for: "CEA1", "CEA" / No results found for: "CEA1", "CEA" No results found for: "PSA1" No results found for: "XBM841" No results found for: "CAN125"  No results found for: "TOTALPROTELP", "ALBUMINELP", "A1GS", "A2GS", "BETS", "BETA2SER", "GAMS", "MSPIKE", "SPEI" Lab Results  Component Value Date   TIBC 318 11/12/2023   TIBC 412 09/22/2022   FERRITIN 400 (H) 11/12/2023   FERRITIN 35 09/22/2022   IRONPCTSAT 20 11/12/2023   IRONPCTSAT 7 (L) 09/22/2022   No results found for: "LDH"   STUDIES:   DG Chest Portable 1 View Result Date: 10/25/2023 CLINICAL DATA:   Difficulty breathing, flu like symptoms EXAM: PORTABLE CHEST 1 VIEW COMPARISON:  08/29/2022.  Chest CT 10/19/2023 FINDINGS: Pulmonary nodules are noted in the left upper lobe and left lower lung. Nodular densities noted in the right mid lung and right lung base. These appear new and/or enlarged since recent CT. No effusions. Heart and mediastinal contours within normal limits. Right Port-A-Cath is unchanged since prior CT. IMPRESSION: Bilateral pulmonary nodules/metastases, some of which appear enlarged since recent CT. Electronically Signed   By: Charlett Nose M.D.   On: 10/25/2023 22:15   CT CHEST ABDOMEN PELVIS W CONTRAST Result Date: 10/20/2023 CLINICAL DATA:  History of metastatic cervical cancer, follow-up. * Tracking Code: BO * EXAM: CT CHEST, ABDOMEN, AND PELVIS WITH CONTRAST TECHNIQUE: Multidetector CT imaging of the chest, abdomen and pelvis was performed following the standard protocol during bolus administration of intravenous contrast. RADIATION DOSE REDUCTION: This exam was performed according to the departmental dose-optimization program which includes automated exposure control, adjustment of the mA and/or kV according to patient size and/or use of iterative reconstruction technique. CONTRAST:  OMNIPAQUE IOHEXOL 300 MG/ML  SOLN COMPARISON:  Multiple priors including most recent CT May 08, 2023 FINDINGS: CT CHEST FINDINGS Cardiovascular: Right chest Port-A-Cath tip near the superior cavoatrial junction. Normal caliber thoracic aorta. No central pulmonary embolus on this nondedicated study. Normal size heart. Mediastinum/Nodes: No supraclavicular adenopathy. No suspicious thyroid nodule. No pathologically enlarged mediastinal, hilar or axillary lymph nodes. The esophagus is grossly unremarkable. Lungs/Pleura: Increased size and number of bilateral pulmonary nodules. For instance: -Pleural-based pulmonary nodule along the left major fissure measures 19 mm on image 85/3 previously 5 mm.  -Superior segment of the left lower lobe measuring 6 mm on image 46/3, new from prior. -subpleural right middle lobe pulmonary nodule measures 12 mm on image 85/3, new from prior. Progressive apical predominant fine centrilobular nodularity possibly reflecting smoking related respiratory bronchiolitis. Musculoskeletal: No aggressive lytic  or blastic lesion of bone. CT ABDOMEN PELVIS FINDINGS Hepatobiliary: New subtle hepatic hypodensities.  For reference: -In the central lateral left lobe of the liver measuring 7 mm on image 49/2 -In the right lobe of the liver measuring 7 mm on image 51/2. Gallbladder is unremarkable.  No biliary ductal dilation. Pancreas: No pancreatic ductal dilation or evidence of acute inflammation. Spleen: No splenomegaly. Adrenals/Urinary Tract: Bilateral adrenal glands appear normal. No hydronephrosis. Kidneys demonstrate symmetric enhancement. Urinary bladder is unremarkable for degree of distension. Stomach/Bowel: Stomach is within normal limits. Appendix appears normal. No evidence of bowel wall thickening, distention, or inflammatory changes. Vascular/Lymphatic: Aortic atherosclerosis. Enlarged left periaortic lymph node measures 10 mm on image 65/2 previously 7 mm. Reproductive: New fluid and irregular nodular enhancement in the endometrial canal. Other: No significant abdominopelvic free fluid. Musculoskeletal: No aggressive lytic or blastic lesion of bone. Avascular necrosis of the right femoral head. IMPRESSION: 1. Increased size and number of bilateral pulmonary nodules, consistent with worsening metastatic disease. 2. New subtle hepatic hypodensities, suspicious for metastatic disease. 3. New fluid and irregular nodular enhancement in the endometrial canal, suspicious for recurrent disease. 4. Enlarged left periaortic lymph node, suspicious for metastatic disease. 5.  Aortic Atherosclerosis (ICD10-I70.0). Electronically Signed   By: Maudry Mayhew M.D.   On: 10/20/2023 10:04

## 2023-11-12 ENCOUNTER — Inpatient Hospital Stay

## 2023-11-12 ENCOUNTER — Inpatient Hospital Stay (HOSPITAL_BASED_OUTPATIENT_CLINIC_OR_DEPARTMENT_OTHER): Admitting: Hematology

## 2023-11-12 VITALS — Wt 166.4 lb

## 2023-11-12 VITALS — BP 112/77 | HR 92 | Temp 98.0°F | Resp 15

## 2023-11-12 DIAGNOSIS — H189 Unspecified disorder of cornea: Secondary | ICD-10-CM | POA: Diagnosis not present

## 2023-11-12 DIAGNOSIS — C538 Malignant neoplasm of overlapping sites of cervix uteri: Secondary | ICD-10-CM

## 2023-11-12 DIAGNOSIS — C7801 Secondary malignant neoplasm of right lung: Secondary | ICD-10-CM | POA: Diagnosis not present

## 2023-11-12 DIAGNOSIS — D649 Anemia, unspecified: Secondary | ICD-10-CM | POA: Diagnosis not present

## 2023-11-12 DIAGNOSIS — I10 Essential (primary) hypertension: Secondary | ICD-10-CM | POA: Diagnosis not present

## 2023-11-12 DIAGNOSIS — F17210 Nicotine dependence, cigarettes, uncomplicated: Secondary | ICD-10-CM | POA: Diagnosis not present

## 2023-11-12 DIAGNOSIS — Z79899 Other long term (current) drug therapy: Secondary | ICD-10-CM | POA: Diagnosis not present

## 2023-11-12 DIAGNOSIS — Z95828 Presence of other vascular implants and grafts: Secondary | ICD-10-CM

## 2023-11-12 DIAGNOSIS — D500 Iron deficiency anemia secondary to blood loss (chronic): Secondary | ICD-10-CM | POA: Diagnosis not present

## 2023-11-12 DIAGNOSIS — G629 Polyneuropathy, unspecified: Secondary | ICD-10-CM | POA: Diagnosis not present

## 2023-11-12 DIAGNOSIS — Z5111 Encounter for antineoplastic chemotherapy: Secondary | ICD-10-CM | POA: Diagnosis not present

## 2023-11-12 DIAGNOSIS — C539 Malignant neoplasm of cervix uteri, unspecified: Secondary | ICD-10-CM | POA: Diagnosis not present

## 2023-11-12 DIAGNOSIS — C786 Secondary malignant neoplasm of retroperitoneum and peritoneum: Secondary | ICD-10-CM | POA: Diagnosis not present

## 2023-11-12 DIAGNOSIS — C787 Secondary malignant neoplasm of liver and intrahepatic bile duct: Secondary | ICD-10-CM | POA: Diagnosis not present

## 2023-11-12 DIAGNOSIS — C7802 Secondary malignant neoplasm of left lung: Secondary | ICD-10-CM | POA: Diagnosis not present

## 2023-11-12 LAB — CBC WITH DIFFERENTIAL/PLATELET
Abs Immature Granulocytes: 0.05 10*3/uL (ref 0.00–0.07)
Basophils Absolute: 0 10*3/uL (ref 0.0–0.1)
Basophils Relative: 0 %
Eosinophils Absolute: 0.1 10*3/uL (ref 0.0–0.5)
Eosinophils Relative: 1 %
HCT: 26.2 % — ABNORMAL LOW (ref 36.0–46.0)
Hemoglobin: 8.6 g/dL — ABNORMAL LOW (ref 12.0–15.0)
Immature Granulocytes: 1 %
Lymphocytes Relative: 25 %
Lymphs Abs: 1.8 10*3/uL (ref 0.7–4.0)
MCH: 32.1 pg (ref 26.0–34.0)
MCHC: 32.8 g/dL (ref 30.0–36.0)
MCV: 97.8 fL (ref 80.0–100.0)
Monocytes Absolute: 0.9 10*3/uL (ref 0.1–1.0)
Monocytes Relative: 13 %
Neutro Abs: 4.3 10*3/uL (ref 1.7–7.7)
Neutrophils Relative %: 60 %
Platelets: 243 10*3/uL (ref 150–400)
RBC: 2.68 MIL/uL — ABNORMAL LOW (ref 3.87–5.11)
RDW: 18 % — ABNORMAL HIGH (ref 11.5–15.5)
WBC: 7.2 10*3/uL (ref 4.0–10.5)
nRBC: 0 % (ref 0.0–0.2)

## 2023-11-12 LAB — COMPREHENSIVE METABOLIC PANEL
ALT: 13 U/L (ref 0–44)
AST: 14 U/L — ABNORMAL LOW (ref 15–41)
Albumin: 2.9 g/dL — ABNORMAL LOW (ref 3.5–5.0)
Alkaline Phosphatase: 60 U/L (ref 38–126)
Anion gap: 11 (ref 5–15)
BUN: 9 mg/dL (ref 6–20)
CO2: 23 mmol/L (ref 22–32)
Calcium: 8.7 mg/dL — ABNORMAL LOW (ref 8.9–10.3)
Chloride: 104 mmol/L (ref 98–111)
Creatinine, Ser: 0.99 mg/dL (ref 0.44–1.00)
GFR, Estimated: >60 mL/min (ref >60)
Glucose, Bld: 83 mg/dL (ref 70–99)
Potassium: 3.7 mmol/L (ref 3.5–5.1)
Sodium: 138 mmol/L (ref 135–145)
Total Bilirubin: 0.4 mg/dL (ref 0.0–1.2)
Total Protein: 6.8 g/dL (ref 6.5–8.1)

## 2023-11-12 LAB — IRON AND TIBC
Iron: 62 ug/dL (ref 28–170)
Saturation Ratios: 20 % (ref 10.4–31.8)
TIBC: 318 ug/dL (ref 250–450)
UIBC: 256 ug/dL

## 2023-11-12 LAB — FERRITIN: Ferritin: 400 ng/mL — ABNORMAL HIGH (ref 11–307)

## 2023-11-12 LAB — MAGNESIUM: Magnesium: 1.4 mg/dL — ABNORMAL LOW (ref 1.7–2.4)

## 2023-11-12 MED ORDER — SODIUM CHLORIDE FLUSH 0.9 % IV SOLN
10.0000 mL | Freq: Once | INTRAVENOUS | Status: AC
  Administered 2023-11-12: 10 mL via INTRAVENOUS
  Filled 2023-11-12: qty 10

## 2023-11-12 MED ORDER — SODIUM CHLORIDE 0.9% FLUSH
10.0000 mL | INTRAVENOUS | Status: DC | PRN
  Administered 2023-11-12: 10 mL

## 2023-11-12 MED ORDER — MAGNESIUM OXIDE -MG SUPPLEMENT 400 (240 MG) MG PO TABS: 400.0000 mg | ORAL_TABLET | Freq: Three times a day (TID) | ORAL | 3 refills | Status: DC

## 2023-11-12 MED ORDER — MAGNESIUM SULFATE 4 GM/100ML IV SOLN
4.0000 g | Freq: Once | INTRAVENOUS | Status: AC
  Administered 2023-11-12: 4 g via INTRAVENOUS
  Filled 2023-11-12: qty 100

## 2023-11-12 MED ORDER — SODIUM CHLORIDE 0.9 % IV SOLN: INTRAVENOUS | Status: DC

## 2023-11-12 MED ORDER — TISOTUMAB VEDOTIN-TFTV 40 MG IV SOLR
2.0000 mg/kg | Freq: Once | INTRAVENOUS | Status: AC
  Administered 2023-11-12: 160 mg via INTRAVENOUS
  Filled 2023-11-12: qty 16

## 2023-11-12 MED ORDER — PALONOSETRON HCL INJECTION 0.25 MG/5ML
0.2500 mg | Freq: Once | INTRAVENOUS | Status: AC
  Administered 2023-11-12: 0.25 mg via INTRAVENOUS
  Filled 2023-11-12: qty 5

## 2023-11-12 MED ORDER — HEPARIN SOD (PORK) LOCK FLUSH 100 UNIT/ML IV SOLN
500.0000 [IU] | Freq: Once | INTRAVENOUS | Status: AC | PRN
  Administered 2023-11-12: 500 [IU]

## 2023-11-12 NOTE — Patient Instructions (Signed)
 [facility] [place] - A DEPT OF [facility]  Discharge Instructions: Thank you for choosing [facility] to provide your oncology and hematology care.  If you have a lab appointment with the Cancer Center - please note that after April 8th, 2024, all labs will be drawn in the cancer center.  You do not have to check in or register with the main entrance as you have in the past but will complete your check-in in the cancer center.  Wear comfortable clothing and clothing appropriate for easy access to any Portacath or PICC line.   We strive to give you quality time with your provider. You may need to reschedule your appointment if you arrive late (15 or more minutes).  Arriving late affects you and other patients whose appointments are after yours.  Also, if you miss three or more appointments without notifying the office, you may be dismissed from the clinic at the provider's discretion.      For prescription refill requests, have your pharmacy contact our office and allow 72 hours for refills to be completed.    Today you received C1D1 Tivdak and 4g IV magnesium sulfate.     Tisotumab Vedotin Injection What is this medication? TISOTUMAB VEDOTIN (tye SOT ue mab ve DOE tin) treats cervical cancer. It works by blocking a protein that causes cancer cells to grow and multiply. This helps to slow or stop the spread of cancer cells. This medicine may be used for other purposes; ask your health care provider or pharmacist if you have questions. COMMON BRAND NAME(S): Tivdak What should I tell my care team before I take this medication? They need to know if you have any of these conditions: Bleeding disorder Eye disease Liver disease Lung disease Tingling of the fingers or toes or other nerve disorder Vision problems An unusual or allergic reaction to tisotumab vedotin, other medications, foods, dyes, or preservatives If you or your partner are pregnant or trying  to get pregnant Breast-feeding How should I use this medication? This medication is injected into a vein. It is given by your care team in a hospital or clinic setting. A special MedGuide will be given to you before each treatment. Be sure to read this information carefully each time. Talk to your care team about the use of this medication in children. Special care may be needed. Overdosage: If you think you have taken too much of this medicine contact a poison control center or emergency room at once. NOTE: This medicine is only for you. Do not share this medicine with others. What if I miss a dose? Keep appointments for follow-up doses. It is important not to miss your dose. Call your care team if you are unable to keep an appointment. What may interact with this medication? Certain antibiotics, such as clarithromycin, telithromycin Certain antivirals for HIV or hepatitis Certain medications for fungal infections, such as itraconazole, ketoconazole, posaconazole, voriconazole Grapefruit juice This list may not describe all possible interactions. Give your health care provider a list of all the medicines, herbs, non-prescription drugs, or dietary supplements you use. Also tell them if you smoke, drink alcohol, or use illegal drugs. Some items may interact with your medicine. What should I watch for while using this medication? Your condition will be monitored carefully while you are receiving this medication. This medication may make you feel generally unwell. This is not uncommon as chemotherapy can affect healthy cells as well as cancer cells.  Report any side effects. Continue your course of treatment even though you feel ill unless your care team tells you to stop. This medication can cause serious eye damage. Tell your care team right away if you have any change in your eyesight. Your vision will be tested before and during use of this medication. Your care team will prescribe 3 different  types of eye drops before you start treatment. Bring the eye drops with you to each infusion and use them as directed by your care team. Do not wear contact lenses during treatment unless you are told to use them by your eye doctor. This medication may increase your risk of getting an infection. Call your care team for advice if you get a fever, chills, sore throat, or other symptoms of a cold or flu. Do not treat yourself. Try to avoid being around people who are sick. Avoid taking medications that contain aspirin, acetaminophen, ibuprofen, naproxen, or ketoprofen unless instructed by your care team. These medications may hide a fever. This medication may increase your risk to bruise or bleed. Call your care team if you notice any unusual bleeding. This medication may cause serious skin reactions. They can happen weeks to months after starting the medication. Contact your care team right away if you notice fevers or flu-like symptoms with a rash. The rash may be red or purple and then turn into blisters or peeling of the skin. You may also notice a red rash with swelling of the face, lips, or lymph nodes in your neck or under your arms. Talk to your care team if you or your partner may be pregnant. Serious birth defects can occur if you take this medication during pregnancy and for 2 months after the last dose. You will need a negative pregnancy test before starting this medication. Contraception is recommended while taking this medication and for 2 months after the last dose. Your care team can help you find the option that works for you. If your partner can get pregnant, use a condom during sex while taking this medication and for 4 months after the last dose. Do not breastfeed while taking this medication and for 3 weeks after the last dose. This medication may cause infertility. Talk to your care team if you are concerned about your fertility. What side effects may I notice from receiving this  medication? Side effects that you should report to your care team as soon as possible: Allergic reactions--skin rash, itching, hives, swelling of the face, lips, tongue, or throat Bleeding--bloody or black, tar-like stools, vomiting blood or brown material that looks like coffee grounds, red or dark brown urine, small red or purple spots on skin, unusual bruising or bleeding Dry cough, shortness of breath or trouble breathing Eye pain, redness, irritation, or discharge with blurry or decreased vision Frequent or severe nosebleeds Heavy vaginal bleeding Pain, tingling, or numbness in the hands or feet Rash, fever, and swollen lymph nodes Redness, blistering, peeling, or loosening of the skin, including inside the mouth Side effects that usually do not require medical attention (report these to your care team if they continue or are bothersome): Diarrhea Dry eyes Fatigue Hair loss Nausea This list may not describe all possible side effects. Call your doctor for medical advice about side effects. You may report side effects to FDA at 1-800-FDA-1088. Where should I keep my medication? This medication is given in a hospital or clinic. It will not be stored at home. NOTE: This sheet is a summary.  It may not cover all possible information. If you have questions about this medicine, talk to your doctor, pharmacist, or health care provider.  2024 Elsevier/Gold Standard (2023-07-31 00:00:00)   BELOW ARE SYMPTOMS THAT SHOULD BE REPORTED IMMEDIATELY: *FEVER GREATER THAN 100.4 F (38 C) OR HIGHER *CHILLS OR SWEATING *NAUSEA AND VOMITING THAT IS NOT CONTROLLED WITH YOUR NAUSEA MEDICATION *UNUSUAL SHORTNESS OF BREATH *UNUSUAL BRUISING OR BLEEDING *URINARY PROBLEMS (pain or burning when urinating, or frequent urination) *BOWEL PROBLEMS (unusual diarrhea, constipation, pain near the anus) TENDERNESS IN MOUTH AND THROAT WITH OR WITHOUT PRESENCE OF ULCERS (sore throat, sores in mouth, or a  toothache) UNUSUAL RASH, SWELLING OR PAIN  UNUSUAL VAGINAL DISCHARGE OR ITCHING   Items with * indicate a potential emergency and should be followed up as soon as possible or go to the Emergency Department if any problems should occur.  Please show the CHEMOTHERAPY ALERT CARD or IMMUNOTHERAPY ALERT CARD at check-in to the Emergency Department and triage nurse.  Should you have questions after your visit or need to cancel or reschedule your appointment, please contact [facility] [place] - A DEPT OF [facility] [phone]  and follow the prompts.  Office hours are 8:00 a.m. to 4:30 p.m. Monday - Friday. Please note that voicemails left after 4:00 p.m. may not be returned until the following business day.  We are closed weekends and major holidays. You have access to a nurse at all times for urgent questions. Please call the main number to the clinic [phone] and follow the prompts.  For any non-urgent questions, you may also contact your provider using MyChart. We now offer e-Visits for anyone 30 and older to request care online for non-urgent symptoms. For details visit [email or web address].   Also download the MyChart app! Go to the app store, search "MyChart", open the app, select , and log in with your MyChart username and password.

## 2023-11-12 NOTE — Patient Instructions (Signed)

## 2023-11-12 NOTE — Progress Notes (Signed)
 Patient has been examined by Dr. Ellin Saba. Vital signs and labs have been reviewed by MD - ANC, Creatinine, LFTs, hemoglobin, and platelets are within treatment parameters per M.D. - pt may proceed with treatment.  Primary RN and pharmacy notified.

## 2023-11-12 NOTE — Progress Notes (Signed)
 Patient presents today for C1D1 Tivdak infusion. Patient is in satisfactory condition with no new complaints voiced.  Vital signs are stable.  Labs reviewed by Dr. Ellin Saba during the office visit and all labs are within treatment parameters. Patient will receive 4g IV magnesium sulfate per Dr.K  We will proceed with treatment per MD orders.   Eye drops given as directed, ice packs placed on eyes immediately after vasoconstrictor eye drops were given and 20 minute before Tivdak was given, throughout the infusion and 20 minute post infusion. Patient tolerated infusion with no complaints.  Treatment given today per MD orders. Tolerated infusion without adverse affects. Vital signs stable. No complaints at this time. Discharged from clinic ambulatory in stable condition. Alert and oriented x 3. F/U with [facility] as scheduled.

## 2023-11-12 NOTE — Progress Notes (Signed)
 Patient has had eye exam on 11/11/23 - cleared for treatment.  V.O. Dr Carilyn Goodpasture, PharmD

## 2023-11-13 ENCOUNTER — Other Ambulatory Visit (HOSPITAL_COMMUNITY): Payer: Self-pay | Admitting: Hematology

## 2023-11-13 MED ORDER — AMOXICILLIN-POT CLAVULANATE 875-125 MG PO TABS: 1.0000 | ORAL_TABLET | Freq: Two times a day (BID) | ORAL | 0 refills | Status: DC

## 2023-11-13 NOTE — Progress Notes (Signed)
 24 hour follow up call - patient has been resting, no issues at this time.

## 2023-11-16 ENCOUNTER — Inpatient Hospital Stay: Payer: Medicaid Other

## 2023-11-16 ENCOUNTER — Inpatient Hospital Stay: Admitting: Dietician

## 2023-11-16 ENCOUNTER — Inpatient Hospital Stay: Payer: Medicaid Other | Admitting: Hematology

## 2023-11-16 DIAGNOSIS — Z7689 Persons encountering health services in other specified circumstances: Secondary | ICD-10-CM | POA: Diagnosis not present

## 2023-11-16 NOTE — Progress Notes (Signed)
 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 CT on 2/17 shows progression. Pt is currently receiving second line Tivdak (start 3/4).  2/23-2/26 hospital admission with influenza A  Last seen by RD 10/02/22  Met with pt and husband in office for follow-up on recent wt loss. Pt reports having a good appetite. She is eating 2 meals and drinking 2 shakes (fruits with milk or juice base). Yesterday had bacon, hamburger lettuce wrap (mayo/mustard) for lunch. Had bacon lettuce wrap (mustard/mayo) for dinner. Patient says bacon is tasting especially good to her the last few days. Patient reports wt loss related to hospitalization for the flu. Patient did not eat for the week prior to admission due to feeling ill. This has resolved. Patient denies nutrition impact symptoms.    Medications: reviewed   Labs: 3/13 - Hgb 8.6, albumin 2.9 (trending up)  Anthropometrics: Last wt 166 lb 7.2 oz   3/4 - 168 lb 3.4 oz 2/24 - 155 lb 3.2 oz (?) 2/23 - 165 lb 5.5 oz  1/16 - 169 lb 8.5 oz   NUTRITION DIAGNOSIS: Food and nutrition related knowledge deficit improving    INTERVENTION:  Encourage high calorie high protein snacks in between meals - handouts with ideas provided - handout with snack ideas provided  Educated on foods that offer iron, folate, B12, and C - handout on anemia provided Suggested drinking Ensure Complete/equivalent as needed with decreased appetite - samples + coupons   MONITORING, EVALUATION, GOAL: wt trends, intake   NEXT VISIT: To be scheduled as needed - pt has contact information

## 2023-11-18 ENCOUNTER — Inpatient Hospital Stay: Admitting: Licensed Clinical Social Worker

## 2023-11-23 ENCOUNTER — Other Ambulatory Visit: Payer: Medicaid Other

## 2023-11-23 ENCOUNTER — Encounter: Payer: Self-pay | Admitting: Internal Medicine

## 2023-11-23 ENCOUNTER — Ambulatory Visit: Payer: Medicaid Other

## 2023-11-23 ENCOUNTER — Ambulatory Visit: Payer: Medicaid Other | Admitting: Hematology

## 2023-11-23 ENCOUNTER — Ambulatory Visit: Payer: Medicaid Other | Admitting: Internal Medicine

## 2023-11-23 VITALS — BP 123/78 | HR 121 | Ht 64.0 in | Wt 169.8 lb

## 2023-11-23 DIAGNOSIS — H663X2 Other chronic suppurative otitis media, left ear: Secondary | ICD-10-CM | POA: Diagnosis not present

## 2023-11-23 DIAGNOSIS — G620 Drug-induced polyneuropathy: Secondary | ICD-10-CM | POA: Diagnosis not present

## 2023-11-23 DIAGNOSIS — I10 Essential (primary) hypertension: Secondary | ICD-10-CM | POA: Diagnosis not present

## 2023-11-23 DIAGNOSIS — Z7689 Persons encountering health services in other specified circumstances: Secondary | ICD-10-CM | POA: Diagnosis not present

## 2023-11-23 DIAGNOSIS — T451X5A Adverse effect of antineoplastic and immunosuppressive drugs, initial encounter: Secondary | ICD-10-CM

## 2023-11-23 DIAGNOSIS — C538 Malignant neoplasm of overlapping sites of cervix uteri: Secondary | ICD-10-CM

## 2023-11-23 DIAGNOSIS — F3341 Major depressive disorder, recurrent, in partial remission: Secondary | ICD-10-CM

## 2023-11-23 DIAGNOSIS — H5213 Myopia, bilateral: Secondary | ICD-10-CM | POA: Diagnosis not present

## 2023-11-23 MED ORDER — OFLOXACIN 0.3 % OT SOLN
10.0000 [drp] | Freq: Two times a day (BID) | OTIC | 0 refills | Status: AC
Start: 2023-11-23 — End: 2023-12-07

## 2023-11-23 MED ORDER — AMLODIPINE BESYLATE 10 MG PO TABS: 10.0000 mg | ORAL_TABLET | Freq: Every day | ORAL | 3 refills | Status: DC

## 2023-11-23 NOTE — Progress Notes (Unsigned)
 Established Patient Office Visit  Subjective   Patient ID: Kristin Carson, female    DOB: 02-19-1971  Age: 53 y.o. MRN: [id number]  Chief Complaint  Patient presents with   Care Management    Pt. States her left ear is still stopped up from the last time. Pt also requesting vitamins   Kristin Carson returns to care today for routine follow-up.  She was last evaluated by me on 3/10 for hospital follow-up in the setting of COPD exacerbation.  Previously seen by me for follow-up in September 2024.  No medication changes were made at that time and 41-month follow-up was arranged. In the interim, she has been seen by oncology for follow up. There have otherwise been no acute interval events.   Past Medical History:  Diagnosis Date   Anemia    Asthma    Bronchitis    Cancer (HCC)    Cervical   CHF (congestive heart failure) (HCC)    COPD (chronic obstructive pulmonary disease) (HCC)    Depression    GERD (gastroesophageal reflux disease)    Headache    Hypertension    Port-A-Cath in place 09/25/2022   Past Surgical History:  Procedure Laterality Date   BREAST SURGERY     CESAREAN SECTION     IR IMAGING GUIDED PORT INSERTION  09/30/2022   LEG SURGERY     Social History   Tobacco Use   Smoking status: Every Day    Current packs/day: 0.25    Average packs/day: 0.3 packs/day for 23.0 years (5.8 ttl pk-yrs)    Types: Cigarettes   Smokeless tobacco: Never  Vaping Use   Vaping status: Never Used  Substance Use Topics   Alcohol use: Yes    Comment: occ   Drug use: Yes    Types: Marijuana   Family History  Problem Relation Age of Onset   Dermatomyositis Mother    Hypertension Mother    Cancer Mother        lung   Cancer Father        lung   Heart disease Father    Lung cancer Paternal Uncle    Lung cancer Maternal Uncle    Colon cancer Neg Hx    Breast cancer Neg Hx    Ovarian cancer Neg Hx    Endometrial cancer Neg Hx    Pancreatic cancer Neg Hx    Prostate cancer Neg Hx     Allergies  Allergen Reactions   Carrot [Daucus Carota] Hives   Lisinopril-Hydrochlorothiazide     Oral swelling   Ibuprofen Other (See Comments)    Oral swelling   Other Itching and Other (See Comments)    Hair dye, blisters, pus-filled, soreness   Tramadol Hives   Erythromycin Hives   ROS    Objective:     BP 123/78 (BP Location: Right Arm, Patient Position: Sitting, Cuff Size: Normal)   Pulse (!) 121   Ht 5\' 4"  (1.626 m)   Wt 169 lb 12.8 oz (77 kg)   LMP 08/15/2022 (Approximate)   SpO2 93%   BMI 29.15 kg/m  BP Readings from Last 3 Encounters:  11/23/23 123/78  11/12/23 112/77  11/12/23 138/86   Physical Exam  Last CBC Lab Results  Component Value Date   WBC 7.2 11/12/2023   HGB 8.6 (L) 11/12/2023   HCT 26.2 (L) 11/12/2023   MCV 97.8 11/12/2023   MCH 32.1 11/12/2023   RDW 18.0 (H) 11/12/2023   PLT 243 11/12/2023  Last metabolic panel Lab Results  Component Value Date   GLUCOSE 83 11/12/2023   NA 138 11/12/2023   K 3.7 11/12/2023   CL 104 11/12/2023   CO2 23 11/12/2023   BUN 9 11/12/2023   CREATININE 0.99 11/12/2023   GFRNONAA >60 11/12/2023   CALCIUM 8.7 (L) 11/12/2023   PHOS 3.8 10/26/2023   PROT 6.8 11/12/2023   ALBUMIN 2.9 (L) 11/12/2023   BILITOT 0.4 11/12/2023   ALKPHOS 60 11/12/2023   AST 14 (L) 11/12/2023   ALT 13 11/12/2023   ANIONGAP 11 11/12/2023   Last lipids Lab Results  Component Value Date   CHOL 155 11/25/2016   HDL 55 11/25/2016   LDLCALC 87 11/25/2016   TRIG 63 11/25/2016   CHOLHDL 2.8 11/25/2016   Last hemoglobin A1c Lab Results  Component Value Date   HGBA1C 5.3 08/29/2022   Last thyroid functions Lab Results  Component Value Date   TSH 5.325 (H) 03/30/2023     Assessment & Plan:   Problem List Items Addressed This Visit   None   No follow-ups on file.    Billie Lade, MD

## 2023-11-23 NOTE — Patient Instructions (Signed)
 It was a pleasure to see you today.  Thank you for giving [place] the opportunity to be involved in your care.  Below is a brief recap of your visit and next steps.  We will plan to see you again in 3 months.  Summary Ofloxacin ear drops added today Continue additional medications as currently prescribed Follow up in 3 months

## 2023-11-24 ENCOUNTER — Other Ambulatory Visit: Payer: Self-pay

## 2023-11-24 ENCOUNTER — Encounter: Payer: Self-pay | Admitting: Internal Medicine

## 2023-11-24 DIAGNOSIS — H663X2 Other chronic suppurative otitis media, left ear: Secondary | ICD-10-CM | POA: Insufficient documentation

## 2023-11-24 NOTE — Assessment & Plan Note (Signed)
 Remains adequately controlled on current antihypertensive regimen.  No medication changes are indicated today.

## 2023-11-24 NOTE — Assessment & Plan Note (Signed)
 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  Diagnosed in January 2024.  She recently started Tivdak.  Side effects have been tolerable.  Oncology follow-up is scheduled for 4/3.

## 2023-11-24 NOTE — Assessment & Plan Note (Signed)
 Mood is stable with duloxetine 60 mg daily.

## 2023-11-24 NOTE — Assessment & Plan Note (Signed)
 Lower extremity neuropathic pain is tolerable on current regimen of Lyrica, Cymbalta, and hydrocodone.

## 2023-11-24 NOTE — Assessment & Plan Note (Signed)
 She endorses impaired hearing in the left ear.  On otic inspection, the left TM appears ruptured.  White-colored discharge is visible in the left ear canal.  She recently completed Augmentin x 7 days after MRI showed findings concerning for purulent mastoiditis vs complex otitis media. -Start ofloxacin otic drops twice daily x 2 weeks.

## 2023-11-25 ENCOUNTER — Telehealth: Payer: Self-pay | Admitting: *Deleted

## 2023-11-25 ENCOUNTER — Inpatient Hospital Stay: Admitting: Licensed Clinical Social Worker

## 2023-11-25 NOTE — Telephone Encounter (Signed)
 Patient called in tears stating that she has not been able to walk or sleep secondary to excruciating bilateral leg and foot pain.  States she is taking Oxy and Lyrica as prescribed with no relief.  Advised that she go to the ER to be evaluated due to the intensity of her pain.  Verbalized she would have someone take her.

## 2023-12-02 DIAGNOSIS — Z7689 Persons encountering health services in other specified circumstances: Secondary | ICD-10-CM | POA: Diagnosis not present

## 2023-12-02 NOTE — Progress Notes (Signed)
 [facility] [address] [name]    Clinic Day:  12/03/23     Referring physician: Billie Lade, MD  Patient Care Team: Billie Lade, MD as PCP - General (Internal Medicine) Doreatha Massed, MD as Medical Oncologist (Medical Oncology) Therese Sarah, RN as Oncology Nurse Navigator (Medical Oncology)   ASSESSMENT & PLAN:   Assessment: 1.  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 - CTAP (08/30/2022): Irregularity of the endometrium extending through the cervix with foci of gas in the cervix.  Multifocal omental and peritoneal metastasis.  Metastatic left periaortic, aortocaval, left external iliac and right common iliac lymph nodes.  Multiple bilateral lung nodules at bases.  12 mm hypodensity in the liver near the falciform ligament. - Endometrial biopsy (09/09/2022): Invasive moderately to poorly differentiated squamous cell carcinoma with abundant necrosis.  Cervix biopsy: Invasive and in situ moderate to poorly differentiated squamous cell carcinoma.  High risk HPV positive. - NGS: PD-L1 (22 C3): CPS 2, HER2 IHC negative.  PIK3CA exon 10 pathogenic variant.  MS-stable.  TMB-low.  FANCM and SDHA pathogenic variants. - PD-L1 CPS: 8% - Cycle 1 of cisplatin, paclitaxel, bevacizumab on 10/02/2022, pembrolizumab added with cycle 2 on 10/23/2022 through 09/17/2023 (cycle 12) discontinued for progression. - CT CAP (10/18/2022): Increased size and number of bilateral lung nodules.  New subtle hepatic hypodensity suspicious for metastatic disease.  Enlarged left periportal lymph node. 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   2.  Social/family history: - She is married and lives at home with her husband and roommate.  She has never worked.  She is current active smoker, half pack per day, for the last 28 years.  She occasionally smokes marijuana. - Mother died of lung cancer.  Father had head and neck cancer.  Maternal grandmother had cancer.  Paternal uncle died of cancer.  Maternal uncle also had cancer.    Plan: 1.  Metastatic cervical cancer to the lungs, peritoneum and liver: - CT scan (10/19/2023): Progression. - Tivdak cycle 1 started on 11/12/2023. - She denies any changes in her vision.  She continues to have decreased hearing in the left ear since she had flu on 10/26/2023.  Based on the MRI of the brain findings on 11/06/2023, I have given her Augmentin for 7 days.  ER discharge has completely resolved.  However she still has decreased hearing. - Reviewed labs today: Normal LFTs with improved albumin of 3.3.  Creatinine normal.  CBC grossly normal. - Proceed with cycle 2 today without any dose modifications.  She will have eye exam again during the week of 12/21/2023 prior to treatment on 12/24/2023.  RTC 3 weeks for follow-up.   2.  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  Hemoglobin today is 9.  Ferritin was 400 and percent saturation 20.   3.  Peripheral neuropathy: - Continue Lyrica 3 times daily.  Continue oxycodone as needed.  No worsening reported.   4.  Hypertension: - Continue amlodipine 10 mg daily.  Blood pressure is 140/86.  5.  Hypomagnesemia: - Continue magnesium 3 times daily.  Magnesium today is normal at 1.8.  No orders of the defined types were placed in this encounter.     Alben Deeds Teague,acting as a Neurosurgeon for Doreatha Massed, MD.,have documented all relevant documentation on the behalf of Doreatha Massed, MD,as directed by  Doreatha Massed, MD while in the presence of Doreatha Massed, MD.  I, Doreatha Massed MD, have reviewed the above documentation for accuracy and completeness, and I agree with the above.      Doreatha Massed, MD   4/3/202511:31 AM  CHIEF COMPLAINT:   Diagnosis: metastatic cervical squamous cell carcinoma    Cancer Staging  Cervical cancer [facility]) Staging form: Cervix Uteri, AJCC Version 9 - Clinical stage from 09/19/2022: FIGO Stage IVB (cT4, cN2a, cM1) - Unsigned    Prior Therapy: none  Current Therapy:  Cisplatin + Paclitaxel + Bevacizumab, Keytruda   HISTORY OF PRESENT ILLNESS:   Oncology History Overview Note  PD-L1 CPS 8%   Cervical cancer (HCC)  09/19/2022 Initial Diagnosis   Cervical cancer (HCC)   10/02/2022 - 09/17/2023 Chemotherapy   [name]     10/29/2023 Genetic Testing   Negative genetic testing on the CancerNext+RNAinsight.  The report date is 10/29/2023.  The [facility] CancerNext+RNAinsight Panel includes sequencing, rearrangement analysis, and RNA analysis for the following 39 genes: APC, ATM, BAP1, BARD1, BMPR1A, BRCA1, BRCA2, BRIP1, CDH1, CDKN2A, CHEK2, FH, FLCN, MET, MLH1, MSH2, MSH6, MUTYH, NF1, NTHL1, PALB2, PMS2, PTEN, RAD51C, RAD51D, SMAD4, STK11, TP53, TSC1, TSC2, and VHL (sequencing and deletion/duplication); AXIN2, HOXB13, MBD4, MSH3, POLD1 and POLE (sequencing only); EPCAM and GREM1 (deletion/duplication only).   11/12/2023 -  Chemotherapy   Patient is on Treatment Plan : CERVICAL Tisotumab vedotin-tftv (Tivdak) q21d     Uterine cancer (HCC)  10/15/2023 Initial Diagnosis   Uterine cancer (HCC)   10/29/2023 Genetic Testing   Negative genetic testing on the CancerNext+RNAinsight.  The report date is 10/29/2023.  The [facility] CancerNext+RNAinsight Panel includes sequencing, rearrangement analysis, and RNA analysis for the following 39 genes: APC, ATM, BAP1, BARD1, BMPR1A, BRCA1, BRCA2, BRIP1, CDH1, CDKN2A, CHEK2, FH,  FLCN, MET, MLH1, MSH2, MSH6, MUTYH, NF1, NTHL1, PALB2, PMS2, PTEN, RAD51C, RAD51D, SMAD4, STK11, TP53, TSC1, TSC2, and VHL (sequencing and deletion/duplication); AXIN2, HOXB13, MBD4, MSH3, POLD1 and POLE (sequencing only); EPCAM and GREM1 (deletion/duplication only).      INTERVAL HISTORY:   Thersea is a 53 y.o. female presenting to clinic today for follow up of metastatic cervical squamous cell carcinoma. She was last seen by me on 11/12/23.  Today, she states that she is doing well overall. Her appetite level is at 40%. Her energy level is at 40%.   PAST MEDICAL HISTORY:   Past Medical History: Past Medical History:  Diagnosis Date   Anemia    Asthma    Bronchitis    Cancer (HCC)    Cervical   CHF (congestive heart failure) (HCC)    COPD (chronic obstructive pulmonary disease) (HCC)    Depression    GERD (gastroesophageal reflux disease)    Headache    Hypertension    Port-A-Cath in place 09/25/2022    Surgical History: Past Surgical History:  Procedure Laterality Date   BREAST SURGERY     CESAREAN SECTION     IR IMAGING GUIDED PORT INSERTION  09/30/2022   LEG SURGERY      Social History: Social History   Socioeconomic History   Marital status: Married  Spouse name: Not on file   Number of children: Not on file   Years of education: Not on file   Highest education level: Not on file  Occupational History   Not on file  Tobacco Use   Smoking status: Every Day    Current packs/day: 0.25    Average packs/day: 0.3 packs/day for 23.0 years (5.8 ttl pk-yrs)    Types: Cigarettes   Smokeless tobacco: Never  Vaping Use   Vaping status: Never Used  Substance and Sexual Activity   Alcohol use: Yes    Comment: occ   Drug use: Yes    Types: Marijuana   Sexual activity: Yes    Birth control/protection: None  Other Topics Concern   Not on file  Social History Narrative   Not on file   Social Drivers of Health   Financial Resource Strain: Low Risk  (09/09/2022)    Overall Financial Resource Strain (CARDIA)    Difficulty of Paying Living Expenses: Not hard at all  Food Insecurity: No Food Insecurity (10/26/2023)   Hunger Vital Sign    Worried About Running Out of Food in the Last Year: Never true    Ran Out of Food in the Last Year: Never true  Transportation Needs: No Transportation Needs (10/26/2023)   PRAPARE - Administrator, Civil Service (Medical): No    Lack of Transportation (Non-Medical): No  Physical Activity: Insufficiently Active (09/09/2022)   Exercise Vital Sign    Days of Exercise per Week: 2 days    Minutes of Exercise per Session: 20 min  Stress: No Stress Concern Present (09/09/2022)   [employer] of Occupational Health - Occupational Stress Questionnaire    Feeling of Stress : Only a little  Social Connections: Socially Isolated (09/09/2022)   Social Connection and Isolation Panel [NHANES]    Frequency of Communication with Friends and Family: Three times a week    Frequency of Social Gatherings with Friends and Family: More than three times a week    Attends Religious Services: Never    Database administrator or Organizations: No    Attends Banker Meetings: Never    Marital Status: Separated  Intimate Partner Violence: Not At Risk (10/26/2023)   Humiliation, Afraid, Rape, and Kick questionnaire    Fear of Current or Ex-Partner: No    Emotionally Abused: No    Physically Abused: No    Sexually Abused: No    Family History: Family History  Problem Relation Age of Onset   Dermatomyositis Mother    Hypertension Mother    Cancer Mother        lung   Cancer Father        lung   Heart disease Father    Lung cancer Paternal Uncle    Lung cancer Maternal Uncle    Colon cancer Neg Hx    Breast cancer Neg Hx    Ovarian cancer Neg Hx    Endometrial cancer Neg Hx    Pancreatic cancer Neg Hx    Prostate cancer Neg Hx     Current Medications:  Current Outpatient Medications:    acetaminophen  (TYLENOL) 500 MG tablet, Take 2 tablets (1,000 mg total) by mouth every 8 (eight) hours as needed for mild pain (pain score 1-3), fever or headache., Disp: , Rfl:    amLODipine (NORVASC) 10 MG tablet, Take 1 tablet (10 mg total) by mouth daily., Disp: 90 tablet, Rfl: 3   brimonidine (ALPHAGAN P)  0.1 % SOLN, Place 3 drops into both eyes immediately prior to start of Tivdak infusion, Disp: 5 mL, Rfl: 11   budesonide-formoterol (SYMBICORT) 160-4.5 MCG/ACT inhaler, Inhale 2 puffs into the lungs in the morning and at bedtime., Disp: 1 each, Rfl: 3   Carboxymethylcellulose Sod PF 0.5 % SOLN, Place 2 drops into both eyes 4 (four) times daily. Continue for 30 days after last dose of Tivdak, Disp: 30 each, Rfl: 11   cetirizine (ZYRTEC ALLERGY) 10 MG tablet, Take 1 tablet (10 mg total) by mouth daily., Disp: 30 tablet, Rfl: 1   dexamethasone (DECADRON) 0.1 % ophthalmic suspension, Instill 1 drop into both eyes immediately prior to Tivdak and in the morning, in the evening and before bedtime for 72 hours after infusion, Disp: 5 mL, Rfl: 11   levalbuterol (XOPENEX HFA) 45 MCG/ACT inhaler, Inhale 2 puffs into the lungs every 8 (eight) hours as needed for wheezing or shortness of breath., Disp: 1 each, Rfl: 2   lidocaine-prilocaine (EMLA) cream, Apply a quarter-sized amount to port a cath site and cover with plastic wrap 1 hour prior to infusion appointments, Disp: 30 g, Rfl: 3   magnesium oxide (MAG-OX) 400 (240 Mg) MG tablet, Take 1 tablet (400 mg total) by mouth 3 (three) times daily., Disp: 90 tablet, Rfl: 3   megestrol (MEGACE) 400 MG/10ML suspension, Take 10 mLs (400 mg total) by mouth 2 (two) times daily., Disp: 480 mL, Rfl: 3   ofloxacin (FLOXIN) 0.3 % OTIC solution, Place 10 drops into the left ear 2 (two) times daily for 14 days., Disp: 10 mL, Rfl: 0   oxyCODONE (OXY IR/ROXICODONE) 5 MG immediate release tablet, Take 1 tablet (5 mg total) by mouth every 6 (six) hours as needed for severe pain (pain score  7-10)., Disp: 90 tablet, Rfl: 0   pantoprazole (PROTONIX) 40 MG tablet, Take 1 tablet (40 mg total) by mouth daily., Disp: 90 tablet, Rfl: 1   predniSONE (DELTASONE) 20 MG tablet, Take 3 tablets by mouth daily x 1 day; then 2 tablet by mouth daily x 2 days; then 1 tablet by mouth daily x 3 days; then half tablet by mouth daily x 3 days and stop prednisone., Disp: 12 tablet, Rfl: 0   pregabalin (LYRICA) 200 MG capsule, Take 1 capsule (200 mg total) by mouth 3 (three) times daily., Disp: 180 capsule, Rfl: 0 No current facility-administered medications for this visit.  Facility-Administered Medications Ordered in Other Visits:    0.9 %  sodium chloride infusion, , Intravenous, Continuous, Doreatha Massed, MD, Last Rate: 10 mL/hr at 12/03/23 0954, New Bag at 12/03/23 0954   heparin lock flush 100 unit/mL, 500 Units, Intracatheter, Once PRN, Doreatha Massed, MD   sodium chloride flush (NS) 0.9 % injection 10 mL, 10 mL, Intracatheter, PRN, Doreatha Massed, MD   Allergies: Allergies  Allergen Reactions   Carrot [Daucus Carota] Hives   Lisinopril-Hydrochlorothiazide     Oral swelling   Ibuprofen Other (See Comments)    Oral swelling   Other Itching and Other (See Comments)    Hair dye, blisters, pus-filled, soreness   Tramadol Hives   Erythromycin Hives    REVIEW OF SYSTEMS:   Review of Systems  Constitutional:  Negative for chills, fatigue and fever.  HENT:   Negative for lump/mass, mouth sores, nosebleeds, sore throat and trouble swallowing.   Eyes:  Negative for eye problems.  Respiratory:  Positive for cough. Negative for shortness of breath.   Cardiovascular:  Negative for chest  pain, leg swelling and palpitations.  Gastrointestinal:  Negative for abdominal pain, constipation, diarrhea, nausea and vomiting.  Genitourinary:  Negative for bladder incontinence, difficulty urinating, dysuria, frequency, hematuria and nocturia.   Musculoskeletal:  Negative for arthralgias,  back pain, flank pain, myalgias and neck pain.  Skin:  Negative for itching and rash.  Neurological:  Positive for numbness. Negative for dizziness and headaches.  Hematological:  Does not bruise/bleed easily.  Psychiatric/Behavioral:  Negative for depression, sleep disturbance and suicidal ideas. The patient is not nervous/anxious.   All other systems reviewed and are negative.    VITALS:   Blood pressure (!) 140/86, pulse 97, temperature 98.2 F (36.8 C), temperature source Oral, resp. rate 20, weight 168 lb 14 oz (76.6 kg), last menstrual period 08/15/2022, SpO2 99%.  Wt Readings from Last 3 Encounters:  12/03/23 168 lb 14 oz (76.6 kg)  11/23/23 169 lb 12.8 oz (77 kg)  11/12/23 166 lb 7.2 oz (75.5 kg)    Body mass index is 28.99 kg/m.  Performance status (ECOG): 1 - Symptomatic but completely ambulatory  PHYSICAL EXAM:   Physical Exam Vitals and nursing note reviewed. Exam conducted with a chaperone present.  Constitutional:      Appearance: Normal appearance.  Cardiovascular:     Rate and Rhythm: Normal rate and regular rhythm.     Pulses: Normal pulses.     Heart sounds: Normal heart sounds.  Pulmonary:     Effort: Pulmonary effort is normal.     Breath sounds: Normal breath sounds.  Abdominal:     Palpations: Abdomen is soft. There is no hepatomegaly, splenomegaly or mass.     Tenderness: There is no abdominal tenderness.  Musculoskeletal:     Right lower leg: No edema.     Left lower leg: No edema.  Lymphadenopathy:     Cervical: No cervical adenopathy.     Right cervical: No superficial, deep or posterior cervical adenopathy.    Left cervical: No superficial, deep or posterior cervical adenopathy.     Upper Body:     Right upper body: No supraclavicular or axillary adenopathy.     Left upper body: No supraclavicular or axillary adenopathy.  Neurological:     General: No focal deficit present.     Mental Status: She is alert and oriented to person, place, and  time.  Psychiatric:        Mood and Affect: Mood normal.        Behavior: Behavior normal.     LABS:      Latest Ref Rng & Units 12/03/2023    8:01 AM 11/12/2023    8:16 AM 11/03/2023    8:17 AM  CBC  WBC 4.0 - 10.5 K/uL 6.2  7.2  12.5   Hemoglobin 12.0 - 15.0 g/dL 9.0  8.6  9.3   Hematocrit 36.0 - 46.0 % 28.3  26.2  27.4   Platelets 150 - 400 K/uL 229  243  316       Latest Ref Rng & Units 12/03/2023    8:01 AM 11/12/2023    8:16 AM 11/03/2023    8:17 AM  CMP  Glucose 70 - 99 mg/dL 84  83  97   BUN 6 - 20 mg/dL 9  9  12    Creatinine 0.44 - 1.00 mg/dL 6.29  5.28  4.13   Sodium 135 - 145 mmol/L 134  138  135   Potassium 3.5 - 5.1 mmol/L 3.6  3.7  4.1  Chloride 98 - 111 mmol/L 100  104  101   CO2 22 - 32 mmol/L 23  23  23    Calcium 8.9 - 10.3 mg/dL 8.7  8.7  8.6   Total Protein 6.5 - 8.1 g/dL 6.9  6.8  6.3   Total Bilirubin 0.0 - 1.2 mg/dL 0.6  0.4  0.4   Alkaline Phos 38 - 126 U/L 56  60  63   AST 15 - 41 U/L 20  14  33   ALT 0 - 44 U/L 11  13  37      No results found for: "CEA1", "CEA" / No results found for: "CEA1", "CEA" No results found for: "PSA1" No results found for: "ZOX096" No results found for: "CAN125"  No results found for: "TOTALPROTELP", "ALBUMINELP", "A1GS", "A2GS", "BETS", "BETA2SER", "GAMS", "MSPIKE", "SPEI" Lab Results  Component Value Date   TIBC 318 11/12/2023   TIBC 412 09/22/2022   FERRITIN 400 (H) 11/12/2023   FERRITIN 35 09/22/2022   IRONPCTSAT 20 11/12/2023   IRONPCTSAT 7 (L) 09/22/2022   No results found for: "LDH"   STUDIES:   MR Brain W Wo Contrast Addendum Date: 11/13/2023 ADDENDUM REPORT: 11/13/2023 16:16 ADDENDUM: Study discussed by telephone with Dr. Doreatha Massed on 11/13/2023 at 1611 hours. Electronically Signed   By: Odessa Fleming M.D.   On: 11/13/2023 16:16   Result Date: 11/13/2023 CLINICAL DATA:  53 year old female with metastatic cervical cancer. Left side hearing loss. EXAM: MRI HEAD WITHOUT AND WITH CONTRAST TECHNIQUE:  Multiplanar, multiecho pulse sequences of the brain and surrounding structures were obtained without and with intravenous contrast. CONTRAST:  7.30mL GADAVIST GADOBUTROL 1 MMOL/ML IV SOLN COMPARISON:  Head CT 07/02/2023 and earlier. FINDINGS: Brain: Cerebral volume is within normal limits for age. No restricted diffusion to suggest acute infarction. No midline shift, mass effect, evidence of mass lesion, ventriculomegaly, extra-axial collection or acute intracranial hemorrhage. Cervicomedullary junction and pituitary are within normal limits. No abnormal brain enhancement is identified. No convincing abnormal dural thickening including along the undersurface of the left temporal bone. No cerebral edema identified. Minimal to mild for age nonspecific scattered white matter T2 and FLAIR hyperintensity. No cortical encephalomalacia or chronic cerebral blood products identified. Vascular: Major intracranial vascular flow voids are preserved. Skull and upper cervical spine: Background bone marrow signal is within normal limits. Negative for age visible cervical spine. Sinuses/Orbits: Orbits appear symmetric and negative. Chronic right lamina papyracea fracture. Increased since last year bilateral paranasal sinus mucosal thickening and new fluid levels in the maxillary sinuses, more so the left. Other: Dedicated thin slice internal auditory imaging. Normal cerebellopontine angles. Normal bilateral cisternal and intracanalicular 7th and 8th cranial nerve segments. Symmetric T2 signal in the bilateral cochlea and vestibular structures. No abnormal IAC enhancement. However, bilateral mastoid effusions are new since last year, and severe on the left. The left-side effusion appears complicated, heterogeneous and enhancing following contrast (see series 18, image 6 versus series 21, image 6). Maintained enhancement of the left sigmoid venous dural sinus there, left IJ bulb. Left stylomastoid foramen there seems to remain normal.  Contralateral right mastoid effusion appears more simple and mild. Negative visible nasopharynx. And bilateral periauricular soft tissues also appear to remain normal. IMPRESSION: 1. Internal auditory imaging remarkable for a Complicated Left Mastoid Effusion. A Purulent Mastoiditis or Complex Otitis Media cannot be excluded. But there is No evidence of CNS spread of infection or other complicating features at this time. Some superimposed acute appearing paranasal sinus inflammation also.  2. No metastatic disease or other acute intracranial abnormality. Negative for age MRI appearance of the brain. Electronically Signed: By: Odessa Fleming M.D. On: 11/13/2023 16:09

## 2023-12-03 ENCOUNTER — Inpatient Hospital Stay

## 2023-12-03 ENCOUNTER — Inpatient Hospital Stay: Attending: Hematology

## 2023-12-03 ENCOUNTER — Inpatient Hospital Stay (HOSPITAL_BASED_OUTPATIENT_CLINIC_OR_DEPARTMENT_OTHER): Admitting: Hematology

## 2023-12-03 ENCOUNTER — Other Ambulatory Visit: Payer: Self-pay | Admitting: *Deleted

## 2023-12-03 VITALS — BP 140/86 | HR 97 | Temp 98.2°F | Resp 20 | Wt 168.9 lb

## 2023-12-03 VITALS — BP 111/64 | HR 98 | Temp 98.9°F | Resp 18

## 2023-12-03 DIAGNOSIS — Z7689 Persons encountering health services in other specified circumstances: Secondary | ICD-10-CM | POA: Diagnosis not present

## 2023-12-03 DIAGNOSIS — C787 Secondary malignant neoplasm of liver and intrahepatic bile duct: Secondary | ICD-10-CM | POA: Insufficient documentation

## 2023-12-03 DIAGNOSIS — Z5111 Encounter for antineoplastic chemotherapy: Secondary | ICD-10-CM | POA: Diagnosis not present

## 2023-12-03 DIAGNOSIS — Z95828 Presence of other vascular implants and grafts: Secondary | ICD-10-CM

## 2023-12-03 DIAGNOSIS — G629 Polyneuropathy, unspecified: Secondary | ICD-10-CM | POA: Insufficient documentation

## 2023-12-03 DIAGNOSIS — Z79899 Other long term (current) drug therapy: Secondary | ICD-10-CM | POA: Insufficient documentation

## 2023-12-03 DIAGNOSIS — C7801 Secondary malignant neoplasm of right lung: Secondary | ICD-10-CM | POA: Insufficient documentation

## 2023-12-03 DIAGNOSIS — C539 Malignant neoplasm of cervix uteri, unspecified: Secondary | ICD-10-CM | POA: Diagnosis not present

## 2023-12-03 DIAGNOSIS — C538 Malignant neoplasm of overlapping sites of cervix uteri: Secondary | ICD-10-CM

## 2023-12-03 DIAGNOSIS — F17210 Nicotine dependence, cigarettes, uncomplicated: Secondary | ICD-10-CM | POA: Insufficient documentation

## 2023-12-03 DIAGNOSIS — Z808 Family history of malignant neoplasm of other organs or systems: Secondary | ICD-10-CM | POA: Diagnosis not present

## 2023-12-03 DIAGNOSIS — C786 Secondary malignant neoplasm of retroperitoneum and peritoneum: Secondary | ICD-10-CM | POA: Diagnosis not present

## 2023-12-03 DIAGNOSIS — D649 Anemia, unspecified: Secondary | ICD-10-CM | POA: Insufficient documentation

## 2023-12-03 DIAGNOSIS — Z801 Family history of malignant neoplasm of trachea, bronchus and lung: Secondary | ICD-10-CM | POA: Diagnosis not present

## 2023-12-03 DIAGNOSIS — G622 Polyneuropathy due to other toxic agents: Secondary | ICD-10-CM

## 2023-12-03 DIAGNOSIS — I10 Essential (primary) hypertension: Secondary | ICD-10-CM | POA: Diagnosis not present

## 2023-12-03 DIAGNOSIS — C7802 Secondary malignant neoplasm of left lung: Secondary | ICD-10-CM | POA: Insufficient documentation

## 2023-12-03 LAB — CBC WITH DIFFERENTIAL/PLATELET
Abs Immature Granulocytes: 0.02 10*3/uL (ref 0.00–0.07)
Basophils Absolute: 0 10*3/uL (ref 0.0–0.1)
Basophils Relative: 0 %
Eosinophils Absolute: 0 10*3/uL (ref 0.0–0.5)
Eosinophils Relative: 1 %
HCT: 28.3 % — ABNORMAL LOW (ref 36.0–46.0)
Hemoglobin: 9 g/dL — ABNORMAL LOW (ref 12.0–15.0)
Immature Granulocytes: 0 %
Lymphocytes Relative: 38 %
Lymphs Abs: 2.3 10*3/uL (ref 0.7–4.0)
MCH: 32.5 pg (ref 26.0–34.0)
MCHC: 31.8 g/dL (ref 30.0–36.0)
MCV: 102.2 fL — ABNORMAL HIGH (ref 80.0–100.0)
Monocytes Absolute: 1.1 10*3/uL — ABNORMAL HIGH (ref 0.1–1.0)
Monocytes Relative: 18 %
Neutro Abs: 2.7 10*3/uL (ref 1.7–7.7)
Neutrophils Relative %: 43 %
Platelets: 229 10*3/uL (ref 150–400)
RBC: 2.77 MIL/uL — ABNORMAL LOW (ref 3.87–5.11)
RDW: 20.8 % — ABNORMAL HIGH (ref 11.5–15.5)
WBC: 6.2 10*3/uL (ref 4.0–10.5)
nRBC: 0 % (ref 0.0–0.2)

## 2023-12-03 LAB — COMPREHENSIVE METABOLIC PANEL WITH GFR
ALT: 11 U/L (ref 0–44)
AST: 20 U/L (ref 15–41)
Albumin: 3.3 g/dL — ABNORMAL LOW (ref 3.5–5.0)
Alkaline Phosphatase: 56 U/L (ref 38–126)
Anion gap: 11 (ref 5–15)
BUN: 9 mg/dL (ref 6–20)
CO2: 23 mmol/L (ref 22–32)
Calcium: 8.7 mg/dL — ABNORMAL LOW (ref 8.9–10.3)
Chloride: 100 mmol/L (ref 98–111)
Creatinine, Ser: 0.98 mg/dL (ref 0.44–1.00)
GFR, Estimated: >60 mL/min (ref >60)
Glucose, Bld: 84 mg/dL (ref 70–99)
Potassium: 3.6 mmol/L (ref 3.5–5.1)
Sodium: 134 mmol/L — ABNORMAL LOW (ref 135–145)
Total Bilirubin: 0.6 mg/dL (ref 0.0–1.2)
Total Protein: 6.9 g/dL (ref 6.5–8.1)

## 2023-12-03 LAB — MAGNESIUM: Magnesium: 1.8 mg/dL (ref 1.7–2.4)

## 2023-12-03 MED ORDER — OXYCODONE HCL 5 MG PO TABS: 5.0000 mg | ORAL_TABLET | Freq: Four times a day (QID) | ORAL | 0 refills | Status: DC | PRN

## 2023-12-03 MED ORDER — PALONOSETRON HCL INJECTION 0.25 MG/5ML
0.2500 mg | Freq: Once | INTRAVENOUS | Status: AC
  Administered 2023-12-03: 0.25 mg via INTRAVENOUS
  Filled 2023-12-03: qty 5

## 2023-12-03 MED ORDER — HEPARIN SOD (PORK) LOCK FLUSH 100 UNIT/ML IV SOLN
500.0000 [IU] | Freq: Once | INTRAVENOUS | Status: AC | PRN
  Administered 2023-12-03: 500 [IU]

## 2023-12-03 MED ORDER — TISOTUMAB VEDOTIN-TFTV 40 MG IV SOLR
2.0000 mg/kg | Freq: Once | INTRAVENOUS | Status: AC
  Administered 2023-12-03: 160 mg via INTRAVENOUS
  Filled 2023-12-03: qty 16

## 2023-12-03 MED ORDER — SODIUM CHLORIDE 0.9% FLUSH
10.0000 mL | INTRAVENOUS | Status: DC | PRN
Start: 2023-12-03 — End: 2023-12-03
  Administered 2023-12-03: 10 mL

## 2023-12-03 MED ORDER — LIDOCAINE-PRILOCAINE 2.5-2.5 % EX CREA
TOPICAL_CREAM | CUTANEOUS | 3 refills | Status: DC
Start: 2023-12-03 — End: 2024-02-08

## 2023-12-03 MED ORDER — PREGABALIN 200 MG PO CAPS: 200.0000 mg | ORAL_CAPSULE | Freq: Three times a day (TID) | ORAL | 0 refills | Status: DC

## 2023-12-03 MED ORDER — SODIUM CHLORIDE 0.9 % IV SOLN
INTRAVENOUS | Status: DC
Start: 2023-12-03 — End: 2023-12-03

## 2023-12-03 MED ORDER — HEPARIN SOD (PORK) LOCK FLUSH 100 UNIT/ML IV SOLN: 500.0000 [IU] | Freq: Once | INTRAVENOUS | Status: DC

## 2023-12-03 MED ORDER — SODIUM CHLORIDE 0.9% FLUSH
10.0000 mL | Freq: Once | INTRAVENOUS | Status: AC
  Administered 2023-12-03: 10 mL via INTRAVENOUS

## 2023-12-03 NOTE — Progress Notes (Signed)
 Patient presents today for chemotherapy infusion. Patient is in satisfactory condition with no new complaints voiced.  Vital signs are stable.  Labs reviewed by Dr. Ellin Saba during the office visit and all labs are within treatment parameters.  We will proceed with treatment per MD orders.   Eye drops administered immediately prior to treatment.  Ice packs also applied 10 mins prior to treatment, during treatment, and 20 mins post treatment.  Patient tolerated treatment well with no complaints voiced.  Patient left ambulatory with SO stable condition.  Vital signs stable at discharge.  Follow up as scheduled.

## 2023-12-03 NOTE — Progress Notes (Signed)
 Patient has been examined by Dr. Ellin Saba. Vital signs and labs have been reviewed by MD - ANC, Creatinine, LFTs, hemoglobin, and platelets are within treatment parameters per M.D. - pt may proceed with treatment.  Primary RN and pharmacy notified.

## 2023-12-03 NOTE — Progress Notes (Signed)
 Eye exam performed on 12/02/23 with results of no change.  Ok to proceed with Tivdak today.  Pryor Ochoa, PharmD

## 2023-12-03 NOTE — Patient Instructions (Signed)
 [facility] [place] - A DEPT OF [facility]  Discharge Instructions: Thank you for choosing [facility] to provide your oncology and hematology care.  If you have a lab appointment with the Cancer Center - please note that after April 8th, 2024, all labs will be drawn in the cancer center.  You do not have to check in or register with the main entrance as you have in the past but will complete your check-in in the cancer center.  Wear comfortable clothing and clothing appropriate for easy access to any Portacath or PICC line.   We strive to give you quality time with your provider. You may need to reschedule your appointment if you arrive late (15 or more minutes).  Arriving late affects you and other patients whose appointments are after yours.  Also, if you miss three or more appointments without notifying the office, you may be dismissed from the clinic at the provider's discretion.      For prescription refill requests, have your pharmacy contact our office and allow 72 hours for refills to be completed.    Today you received the following chemotherapy and/or immunotherapy agents Tivdak.  Tisotumab Vedotin Injection What is this medication? TISOTUMAB VEDOTIN (tye SOT ue mab ve DOE tin) treats cervical cancer. It works by blocking a protein that causes cancer cells to grow and multiply. This helps to slow or stop the spread of cancer cells. This medicine may be used for other purposes; ask your health care provider or pharmacist if you have questions. COMMON BRAND NAME(S): Tivdak What should I tell my care team before I take this medication? They need to know if you have any of these conditions: Bleeding disorder Eye disease Liver disease Lung disease Tingling of the fingers or toes or other nerve disorder Vision problems An unusual or allergic reaction to tisotumab vedotin, other medications, foods, dyes, or preservatives If you or your partner are  pregnant or trying to get pregnant Breast-feeding How should I use this medication? This medication is injected into a vein. It is given by your care team in a hospital or clinic setting. A special MedGuide will be given to you before each treatment. Be sure to read this information carefully each time. Talk to your care team about the use of this medication in children. Special care may be needed. Overdosage: If you think you have taken too much of this medicine contact a poison control center or emergency room at once. NOTE: This medicine is only for you. Do not share this medicine with others. What if I miss a dose? Keep appointments for follow-up doses. It is important not to miss your dose. Call your care team if you are unable to keep an appointment. What may interact with this medication? Certain antibiotics, such as clarithromycin, telithromycin Certain antivirals for HIV or hepatitis Certain medications for fungal infections, such as itraconazole, ketoconazole, posaconazole, voriconazole Grapefruit juice This list may not describe all possible interactions. Give your health care provider a list of all the medicines, herbs, non-prescription drugs, or dietary supplements you use. Also tell them if you smoke, drink alcohol, or use illegal drugs. Some items may interact with your medicine. What should I watch for while using this medication? Your condition will be monitored carefully while you are receiving this medication. This medication may make you feel generally unwell. This is not uncommon as chemotherapy can affect healthy cells as well as cancer cells. Report any side  effects. Continue your course of treatment even though you feel ill unless your care team tells you to stop. This medication can cause serious eye damage. Tell your care team right away if you have any change in your eyesight. Your vision will be tested before and during use of this medication. Your care team will  prescribe 3 different types of eye drops before you start treatment. Bring the eye drops with you to each infusion and use them as directed by your care team. Do not wear contact lenses during treatment unless you are told to use them by your eye doctor. This medication may increase your risk of getting an infection. Call your care team for advice if you get a fever, chills, sore throat, or other symptoms of a cold or flu. Do not treat yourself. Try to avoid being around people who are sick. Avoid taking medications that contain aspirin, acetaminophen, ibuprofen, naproxen, or ketoprofen unless instructed by your care team. These medications may hide a fever. This medication may increase your risk to bruise or bleed. Call your care team if you notice any unusual bleeding. This medication may cause serious skin reactions. They can happen weeks to months after starting the medication. Contact your care team right away if you notice fevers or flu-like symptoms with a rash. The rash may be red or purple and then turn into blisters or peeling of the skin. You may also notice a red rash with swelling of the face, lips, or lymph nodes in your neck or under your arms. Talk to your care team if you or your partner may be pregnant. Serious birth defects can occur if you take this medication during pregnancy and for 2 months after the last dose. You will need a negative pregnancy test before starting this medication. Contraception is recommended while taking this medication and for 2 months after the last dose. Your care team can help you find the option that works for you. If your partner can get pregnant, use a condom during sex while taking this medication and for 4 months after the last dose. Do not breastfeed while taking this medication and for 3 weeks after the last dose. This medication may cause infertility. Talk to your care team if you are concerned about your fertility. What side effects may I notice from  receiving this medication? Side effects that you should report to your care team as soon as possible: Allergic reactions--skin rash, itching, hives, swelling of the face, lips, tongue, or throat Bleeding--bloody or black, tar-like stools, vomiting blood or Zanovia Rotz material that looks like coffee grounds, red or dark Allon Costlow urine, small red or purple spots on skin, unusual bruising or bleeding Dry cough, shortness of breath or trouble breathing Eye pain, redness, irritation, or discharge with blurry or decreased vision Frequent or severe nosebleeds Heavy vaginal bleeding Pain, tingling, or numbness in the hands or feet Rash, fever, and swollen lymph nodes Redness, blistering, peeling, or loosening of the skin, including inside the mouth Side effects that usually do not require medical attention (report these to your care team if they continue or are bothersome): Diarrhea Dry eyes Fatigue Hair loss Nausea This list may not describe all possible side effects. Call your doctor for medical advice about side effects. You may report side effects to FDA at 1-800-FDA-1088. Where should I keep my medication? This medication is given in a hospital or clinic. It will not be stored at home. NOTE: This sheet is a summary. It may not  cover all possible information. If you have questions about this medicine, talk to your doctor, pharmacist, or health care provider.  2024 Elsevier/Gold Standard (2023-07-31 00:00:00)       To help prevent nausea and vomiting after your treatment, we encourage you to take your nausea medication as directed.  BELOW ARE SYMPTOMS THAT SHOULD BE REPORTED IMMEDIATELY: *FEVER GREATER THAN 100.4 F (38 C) OR HIGHER *CHILLS OR SWEATING *NAUSEA AND VOMITING THAT IS NOT CONTROLLED WITH YOUR NAUSEA MEDICATION *UNUSUAL SHORTNESS OF BREATH *UNUSUAL BRUISING OR BLEEDING *URINARY PROBLEMS (pain or burning when urinating, or frequent urination) *BOWEL PROBLEMS (unusual diarrhea,  constipation, pain near the anus) TENDERNESS IN MOUTH AND THROAT WITH OR WITHOUT PRESENCE OF ULCERS (sore throat, sores in mouth, or a toothache) UNUSUAL RASH, SWELLING OR PAIN  UNUSUAL VAGINAL DISCHARGE OR ITCHING   Items with * indicate a potential emergency and should be followed up as soon as possible or go to the Emergency Department if any problems should occur.  Please show the CHEMOTHERAPY ALERT CARD or IMMUNOTHERAPY ALERT CARD at check-in to the Emergency Department and triage nurse.  Should you have questions after your visit or need to cancel or reschedule your appointment, please contact [facility] [place] - A DEPT OF [facility] [phone]  and follow the prompts.  Office hours are 8:00 a.m. to 4:30 p.m. Monday - Friday. Please note that voicemails left after 4:00 p.m. may not be returned until the following business day.  We are closed weekends and major holidays. You have access to a nurse at all times for urgent questions. Please call the main number to the clinic [phone] and follow the prompts.  For any non-urgent questions, you may also contact your provider using MyChart. We now offer e-Visits for anyone 7 and older to request care online for non-urgent symptoms. For details visit [email or web address].   Also download the MyChart app! Go to the app store, search "MyChart", open the app, select [place], and log in with your MyChart username and password.

## 2023-12-03 NOTE — Patient Instructions (Signed)

## 2023-12-12 DIAGNOSIS — Z419 Encounter for procedure for purposes other than remedying health state, unspecified: Secondary | ICD-10-CM | POA: Diagnosis not present

## 2023-12-19 ENCOUNTER — Emergency Department (HOSPITAL_COMMUNITY)
Admission: EM | Admit: 2023-12-19 | Discharge: 2023-12-19 | Disposition: A | Discharge status: Home / Self Care | Attending: Emergency Medicine | Admitting: Emergency Medicine

## 2023-12-19 ENCOUNTER — Encounter (HOSPITAL_COMMUNITY): Payer: Self-pay | Admitting: Emergency Medicine

## 2023-12-19 ENCOUNTER — Emergency Department (HOSPITAL_COMMUNITY)

## 2023-12-19 ENCOUNTER — Other Ambulatory Visit: Payer: Self-pay

## 2023-12-19 DIAGNOSIS — S3992XA Unspecified injury of lower back, initial encounter: Secondary | ICD-10-CM | POA: Diagnosis not present

## 2023-12-19 DIAGNOSIS — M791 Myalgia, unspecified site: Secondary | ICD-10-CM | POA: Diagnosis not present

## 2023-12-19 DIAGNOSIS — M545 Low back pain, unspecified: Secondary | ICD-10-CM | POA: Diagnosis not present

## 2023-12-19 DIAGNOSIS — Z85118 Personal history of other malignant neoplasm of bronchus and lung: Secondary | ICD-10-CM | POA: Insufficient documentation

## 2023-12-19 DIAGNOSIS — Z87891 Personal history of nicotine dependence: Secondary | ICD-10-CM | POA: Diagnosis not present

## 2023-12-19 DIAGNOSIS — M5127 Other intervertebral disc displacement, lumbosacral region: Secondary | ICD-10-CM | POA: Diagnosis not present

## 2023-12-19 DIAGNOSIS — Z8505 Personal history of malignant neoplasm of liver: Secondary | ICD-10-CM | POA: Insufficient documentation

## 2023-12-19 DIAGNOSIS — Z859 Personal history of malignant neoplasm, unspecified: Secondary | ICD-10-CM | POA: Diagnosis not present

## 2023-12-19 DIAGNOSIS — I700 Atherosclerosis of aorta: Secondary | ICD-10-CM | POA: Diagnosis not present

## 2023-12-19 LAB — URINALYSIS, ROUTINE W REFLEX MICROSCOPIC
Bilirubin Urine: NEGATIVE
Glucose, UA: NEGATIVE mg/dL
Hgb urine dipstick: NEGATIVE
Ketones, ur: NEGATIVE mg/dL
Leukocytes,Ua: NEGATIVE
Nitrite: NEGATIVE
Protein, ur: 30 mg/dL — AB
Specific Gravity, Urine: 1.019 (ref 1.005–1.030)
pH: 6 (ref 5.0–8.0)

## 2023-12-19 LAB — BASIC METABOLIC PANEL WITH GFR
Anion gap: 9 (ref 5–15)
BUN: 8 mg/dL (ref 6–20)
CO2: 27 mmol/L (ref 22–32)
Calcium: 9 mg/dL (ref 8.9–10.3)
Chloride: 99 mmol/L (ref 98–111)
Creatinine, Ser: 1.09 mg/dL — ABNORMAL HIGH (ref 0.44–1.00)
GFR, Estimated: >60 mL/min
Glucose, Bld: 82 mg/dL (ref 70–99)
Potassium: 3.8 mmol/L (ref 3.5–5.1)
Sodium: 135 mmol/L (ref 135–145)

## 2023-12-19 LAB — CBC
HCT: 31.5 % — ABNORMAL LOW (ref 36.0–46.0)
Hemoglobin: 10.3 g/dL — ABNORMAL LOW (ref 12.0–15.0)
MCH: 33.2 pg (ref 26.0–34.0)
MCHC: 32.7 g/dL (ref 30.0–36.0)
MCV: 101.6 fL — ABNORMAL HIGH (ref 80.0–100.0)
Platelets: 311 10*3/uL (ref 150–400)
RBC: 3.1 MIL/uL — ABNORMAL LOW (ref 3.87–5.11)
RDW: 19.8 % — ABNORMAL HIGH (ref 11.5–15.5)
WBC: 11.2 10*3/uL — ABNORMAL HIGH (ref 4.0–10.5)
nRBC: 0.2 % (ref 0.0–0.2)

## 2023-12-19 MED ORDER — METHYLPREDNISOLONE 4 MG PO TBPK
ORAL_TABLET | ORAL | 0 refills | Status: DC
Start: 2023-12-19 — End: 2024-01-27

## 2023-12-19 MED ORDER — OXYCODONE-ACETAMINOPHEN 5-325 MG PO TABS
2.0000 | ORAL_TABLET | Freq: Once | ORAL | Status: AC
  Administered 2023-12-19: 2 via ORAL
  Filled 2023-12-19: qty 2

## 2023-12-19 MED ORDER — METHOCARBAMOL 500 MG PO TABS: 500.0000 mg | ORAL_TABLET | Freq: Two times a day (BID) | ORAL | 0 refills | Status: DC | PRN

## 2023-12-19 MED ORDER — OXYCODONE-ACETAMINOPHEN 5-325 MG PO TABS: 1.0000 | ORAL_TABLET | Freq: Four times a day (QID) | ORAL | 0 refills | Status: AC | PRN

## 2023-12-19 NOTE — ED Provider Notes (Signed)
 Kristin Carson Provider Note   CSN: [id number] Arrival date & time: 12/19/23  1645     History  Chief Complaint  Patient presents with   Generalized Body Aches    Kristin Carson is a 53 y.o. female.  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  She takes oxycodone  as needed for pain.    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    Planes of having increasing back pain for the last couple of days, pain in bilateral legs, she has no urinary symptoms, no fevers or chills, no nausea or vomiting or diarrhea.  She does not feel short of breath    Home Medications Prior to Admission medications   Medication Sig Start Date End Date Taking? Authorizing Provider  methocarbamol  (ROBAXIN ) 500 MG tablet Take 1 tablet (500 mg total) by mouth 2 (two) times daily as needed for muscle spasms. 12/19/23  Yes Early Glisson, MD  methylPREDNISolone  (MEDROL  DOSEPAK) 4 MG TBPK tablet Taper over 6 days 12/19/23  Yes Early Glisson, MD  oxyCODONE -acetaminophen  (PERCOCET/ROXICET) 5-325 MG tablet Take 1 tablet by mouth every 6 (six)  hours as needed for up to 3 days for severe pain (pain score 7-10). 12/19/23 12/22/23 Yes Early Glisson, MD  acetaminophen  (TYLENOL ) 500 MG tablet Take 2 tablets (1,000 mg total) by mouth every 8 (eight) hours as needed for mild pain (pain score 1-3), fever or headache. 10/28/23   Justina Oman, MD  amLODipine  (NORVASC ) 10 MG tablet Take 1 tablet (10 mg total) by mouth daily. 11/23/23   Tobi Fortes, MD  brimonidine  (ALPHAGAN  P) 0.1 % SOLN Place 3 drops into both eyes immediately prior to start of Tivdak  infusion 11/03/23   Paulett Boros, MD  budesonide -formoterol  (SYMBICORT ) 160-4.5 MCG/ACT inhaler Inhale 2 puffs into the lungs in the morning and at bedtime. 10/28/23   Justina Oman, MD  Carboxymethylcellulose Sod PF 0.5 % SOLN Place 2 drops into both eyes 4 (four) times daily. Continue for 30 days after last dose of Tivdak  11/03/23   Paulett Boros, MD  cetirizine  (ZYRTEC  ALLERGY) 10 MG tablet Take 1 tablet (10 mg total) by mouth daily. 10/28/23   Justina Oman, MD  dexamethasone  (DECADRON ) 0.1 % ophthalmic suspension Instill 1 drop into both eyes immediately prior to Tivdak  and in the morning, in the evening and before bedtime for 72 hours after infusion 11/03/23   Paulett Boros, MD  levalbuterol  (XOPENEX  HFA) 45 MCG/ACT inhaler Inhale 2 puffs into the lungs every 8 (eight) hours as needed for wheezing or shortness of breath.  10/28/23 10/27/24  Justina Oman, MD  lidocaine -prilocaine  (EMLA ) cream Apply a quarter-sized amount to port a cath site and cover with plastic wrap 1 hour prior to infusion appointments 12/03/23   Paulett Boros, MD  magnesium  oxide (MAG-OX) 400 (240 Mg) MG tablet Take 1 tablet (400 mg total) by mouth 3 (three) times daily. 11/12/23   Paulett Boros, MD  megestrol  (MEGACE ) 400 MG/10ML suspension Take 10 mLs (400 mg total) by mouth 2 (two) times daily. 06/09/23   Paulett Boros, MD  oxyCODONE  (OXY IR/ROXICODONE ) 5 MG immediate release tablet Take 1  tablet (5 mg total) by mouth every 6 (six) hours as needed for severe pain (pain score 7-10). 12/03/23   Paulett Boros, MD  pantoprazole  (PROTONIX ) 40 MG tablet Take 1 tablet (40 mg total) by mouth daily. 05/26/23   Tobi Fortes, MD  predniSONE  (DELTASONE ) 20 MG tablet Take 3 tablets by mouth daily x 1 day; then 2 tablet by mouth daily x 2 days; then 1 tablet by mouth daily x 3 days; then half tablet by mouth daily x 3 days and stop prednisone . 10/28/23   Justina Oman, MD  pregabalin  (LYRICA ) 200 MG capsule Take 1 capsule (200 mg total) by mouth 3 (three) times daily. 12/03/23   Paulett Boros, MD  famotidine  (PEPCID ) 20 MG tablet Take 1 tablet (20 mg total) by mouth 2 (two) times daily. 12/18/17 02/02/20  Knapp, Iva, MD  hydrochlorothiazide  (HYDRODIURIL ) 25 MG tablet Take 1 tablet (25 mg total) by mouth daily. 06/17/19 02/02/20  Knapp, Iva, MD      Allergies    Carrot [daucus carota], Lisinopril -hydrochlorothiazide , Ibuprofen , Other, Tramadol , and Erythromycin    Review of Systems   Review of Systems  All other systems reviewed and are negative.   Physical Exam Updated Vital Signs BP 129/80 (BP Location: Right Arm)   Pulse (!) 101   Temp 98 F (36.7 C) (Oral)   Resp (!) 22   Ht 1.626 m (5\' 4" )   Wt 75.3 kg   LMP 08/15/2022 (Approximate)   SpO2 98%   BMI 28.49 kg/m  Physical Exam Vitals and nursing note reviewed.  Constitutional:      General: She is not in acute distress.    Appearance: She is well-developed.  HENT:     Head: Normocephalic and atraumatic.     Mouth/Throat:     Pharynx: No oropharyngeal exudate.  Eyes:     General: No scleral icterus.       Right eye: No discharge.        Left eye: No discharge.     Conjunctiva/sclera: Conjunctivae normal.     Pupils: Pupils are equal, round, and reactive to light.  Neck:     Thyroid : No thyromegaly.     Vascular: No JVD.  Cardiovascular:     Rate and Rhythm: Normal rate and regular rhythm.     Heart sounds:  Normal heart sounds. No murmur heard.    No friction rub. No gallop.  Pulmonary:     Effort: Pulmonary effort is normal. No respiratory distress.     Breath sounds: Wheezing present. No rales.  Abdominal:     General: Bowel sounds are normal. There is no distension.     Palpations: Abdomen is soft. There is no mass.     Tenderness: There is no abdominal tenderness.  Musculoskeletal:        General: No tenderness. Normal range of motion.     Cervical back: Normal range of motion  and neck supple.     Comments:  there is no reproducible tenderness over the cervical thoracic or lumbar spines, there is paraspinal muscles which are tender laterally and the lumbar area around L3-4 L5 and S1.  There is no redness no induration no signs of abnormality visually no tenderness over the bony structures of the pelvis  Lymphadenopathy:     Cervical: No cervical adenopathy.  Skin:    General: Skin is warm and dry.     Findings: No erythema or rash.  Neurological:     Mental Status: She is alert.     Coordination: Coordination normal.     Comments:   There is no tenderness over the lower back in the central area.  She is able to move all 4 extremities, she has a ambulate spontaneously without assistance, she normal strength at the proximal hip flexors flexors and extensors in the ankle flexors and extensors, she does have decree sensation of the feet which is chronic but normal sensation from the mid tibial region proximal.  Reflexes are intact at the patella tendons bilaterally.  Psychiatric:        Behavior: Behavior normal.     ED Results / Procedures / Treatments   Labs (all labs ordered are listed, but only abnormal results are displayed) Labs Reviewed  URINALYSIS, ROUTINE W REFLEX MICROSCOPIC - Abnormal; Notable for the following components:      Result Value   APPearance CLOUDY (*)    Protein, ur 30 (*)    Bacteria, UA RARE (*)    All other components within normal limits  CBC - Abnormal;  Notable for the following components:   WBC 11.2 (*)    RBC 3.10 (*)    Hemoglobin 10.3 (*)    HCT 31.5 (*)    MCV 101.6 (*)    RDW 19.8 (*)    All other components within normal limits  BASIC METABOLIC PANEL WITH GFR - Abnormal; Notable for the following components:   Creatinine, Ser 1.09 (*)    All other components within normal limits    EKG None  Radiology DG Lumbar Spine Complete Result Date: 12/19/2023 CLINICAL DATA:  Trauma. History of stage IV cancer and generalized body aches. EXAM: LUMBAR SPINE - COMPLETE 4+ VIEW COMPARISON:  02/02/2020. FINDINGS: There is no evidence of acute lumbar spine fracture. There is mild retrolisthesis at L5-S1. Intervertebral disc spaces are maintained. Atherosclerotic calcification of the aorta is noted. IMPRESSION: No acute fracture in the lumbar spine. Electronically Signed   By: Wyvonnia Heimlich M.D.   On: 12/19/2023 19:06    Procedures Procedures    Medications Ordered in ED Medications  oxyCODONE -acetaminophen  (PERCOCET/ROXICET) 5-325 MG per tablet 2 tablet (2 tablets Oral Given 12/19/23 1725)    ED Course/ Medical Decision Making/ A&P                                 Medical Decision Making Amount and/or Complexity of Data Reviewed Labs: ordered. Radiology: ordered.  Risk Prescription drug management.    This patient presents to the ED for concern of back pain differential diagnosis includes muscular pain, could be neuropathic type pain, she has had advanced imaging within the last 2 months which did not show any bony lytic lesions, this could certainly be progressing as it did show progressive of her pulmonary lesions.  That being said she has no neurologic deficits and can follow-up in the  outpatient setting for that.  Will obtain plain films to make sure there is no large areas that need to be addressed, will obtain a urinalysis, she will be given some pain medication.  The patient is agreeable    Additional history  obtained:  Additional history obtained from medical record including prior imaging External records from outside source obtained and reviewed including CT scans and oncology visits over the last couple of years   Lab Tests:  I Ordered, and personally interpreted labs.  The pertinent results include: CBC metabolic panel urinalysis all unremarkable   Imaging Studies ordered:  I ordered imaging studies including lumbar spine film x-rays I independently visualized and interpreted imaging which showed no acute findings, no trauma, no dislocations, no masses I agree with the radiologist interpretation   Medicines ordered and prescription drug management:  I ordered medication including Percocet for pain Reevaluation of the patient after these medicines showed that the patient had some improvement I have reviewed the patients home medicines and have made adjustments as needed   Problem List / ED Course:  Patient has no neurologic symptoms, I do not think she needs an MRI, she is agreeable to close follow-up as an outpatient and will be given prescription medications for home   Social Determinants of Health:  Known stage IV cancer           Final Clinical Impression(s) / ED Diagnoses Final diagnoses:  Myalgia  Acute bilateral low back pain without sciatica    Rx / DC Orders ED Discharge Orders          Ordered    oxyCODONE -acetaminophen  (PERCOCET/ROXICET) 5-325 MG tablet  Every 6 hours PRN        12/19/23 2023    methocarbamol  (ROBAXIN ) 500 MG tablet  2 times daily PRN        12/19/23 2023    methylPREDNISolone  (MEDROL  DOSEPAK) 4 MG TBPK tablet        12/19/23 2023              Early Glisson, MD 12/19/23 2024

## 2023-12-19 NOTE — ED Notes (Signed)
Patient made aware that we need a urine specimen. 

## 2023-12-19 NOTE — ED Triage Notes (Signed)
 Pt to triage states she has stage IV cancer and is having generalized body aches.  States started 2-3 days ago.

## 2023-12-19 NOTE — Discharge Instructions (Signed)
 Thankfully your testing has been unremarkable, I have given you some prescriptions for home to help with your pain, I want you to see your doctor on Monday at the latest, come back to the ER for severe worsening pain numbness weakness or any other worsening symptoms.  Medrol  Dosepak, take over 6 days  Please take Robaxin , 500 mg up to 2 or 3 times a day as needed for muscle spasm, this is a muscle relaxer, it may cause generalized weakness, sleepiness and you should not drive or do important things while taking this medication.  This includes driving a vehicle or taking care of young children, these things should not be done while taking this medication.   Opiate medications such as Percocet or Vicodin or morphine are very strong pain medications that are also very addictive if they are taken for too long, even a single dose can predispose someone to become addicted so be very careful when taking this medication.  You should take the smallest amount possible to relieve your pain, these medications may cause constipation or nausea, please follow-up with your doctor if you are having the need for ongoing pain control despite these medications.  Additionally please be aware that these medications may cause sedation or sleepiness or alter judgment so you should not take this if you are driving a vehicle or operating heavy machinery or taking care of small children.

## 2023-12-23 DIAGNOSIS — Z7689 Persons encountering health services in other specified circumstances: Secondary | ICD-10-CM | POA: Diagnosis not present

## 2023-12-24 ENCOUNTER — Inpatient Hospital Stay

## 2023-12-24 ENCOUNTER — Other Ambulatory Visit: Payer: Self-pay | Admitting: *Deleted

## 2023-12-24 ENCOUNTER — Inpatient Hospital Stay (HOSPITAL_BASED_OUTPATIENT_CLINIC_OR_DEPARTMENT_OTHER): Admitting: Hematology

## 2023-12-24 VITALS — BP 126/76 | HR 109 | Temp 97.9°F | Resp 19

## 2023-12-24 DIAGNOSIS — Z5111 Encounter for antineoplastic chemotherapy: Secondary | ICD-10-CM | POA: Diagnosis not present

## 2023-12-24 DIAGNOSIS — F17210 Nicotine dependence, cigarettes, uncomplicated: Secondary | ICD-10-CM | POA: Diagnosis not present

## 2023-12-24 DIAGNOSIS — C539 Malignant neoplasm of cervix uteri, unspecified: Secondary | ICD-10-CM | POA: Diagnosis not present

## 2023-12-24 DIAGNOSIS — C538 Malignant neoplasm of overlapping sites of cervix uteri: Secondary | ICD-10-CM

## 2023-12-24 DIAGNOSIS — Z95828 Presence of other vascular implants and grafts: Secondary | ICD-10-CM

## 2023-12-24 DIAGNOSIS — C787 Secondary malignant neoplasm of liver and intrahepatic bile duct: Secondary | ICD-10-CM | POA: Diagnosis not present

## 2023-12-24 DIAGNOSIS — Z79899 Other long term (current) drug therapy: Secondary | ICD-10-CM | POA: Diagnosis not present

## 2023-12-24 DIAGNOSIS — D649 Anemia, unspecified: Secondary | ICD-10-CM | POA: Diagnosis not present

## 2023-12-24 DIAGNOSIS — Z7689 Persons encountering health services in other specified circumstances: Secondary | ICD-10-CM | POA: Diagnosis not present

## 2023-12-24 DIAGNOSIS — G629 Polyneuropathy, unspecified: Secondary | ICD-10-CM | POA: Diagnosis not present

## 2023-12-24 DIAGNOSIS — C7802 Secondary malignant neoplasm of left lung: Secondary | ICD-10-CM | POA: Diagnosis not present

## 2023-12-24 DIAGNOSIS — C7801 Secondary malignant neoplasm of right lung: Secondary | ICD-10-CM | POA: Diagnosis not present

## 2023-12-24 DIAGNOSIS — C786 Secondary malignant neoplasm of retroperitoneum and peritoneum: Secondary | ICD-10-CM | POA: Diagnosis not present

## 2023-12-24 DIAGNOSIS — I10 Essential (primary) hypertension: Secondary | ICD-10-CM | POA: Diagnosis not present

## 2023-12-24 DIAGNOSIS — Z808 Family history of malignant neoplasm of other organs or systems: Secondary | ICD-10-CM | POA: Diagnosis not present

## 2023-12-24 LAB — COMPREHENSIVE METABOLIC PANEL WITH GFR
ALT: 8 U/L (ref 0–44)
AST: 21 U/L (ref 15–41)
Albumin: 2.9 g/dL — ABNORMAL LOW (ref 3.5–5.0)
Alkaline Phosphatase: 63 U/L (ref 38–126)
Anion gap: 11 (ref 5–15)
BUN: 8 mg/dL (ref 6–20)
CO2: 23 mmol/L (ref 22–32)
Calcium: 9.2 mg/dL (ref 8.9–10.3)
Chloride: 102 mmol/L (ref 98–111)
Creatinine, Ser: 1.05 mg/dL — ABNORMAL HIGH (ref 0.44–1.00)
GFR, Estimated: >60 mL/min (ref >60)
Glucose, Bld: 94 mg/dL (ref 70–99)
Potassium: 3.4 mmol/L — ABNORMAL LOW (ref 3.5–5.1)
Sodium: 136 mmol/L (ref 135–145)
Total Bilirubin: 0.6 mg/dL (ref 0.0–1.2)
Total Protein: 6.6 g/dL (ref 6.5–8.1)

## 2023-12-24 LAB — CBC WITH DIFFERENTIAL/PLATELET
Abs Immature Granulocytes: 0.02 10*3/uL (ref 0.00–0.07)
Basophils Absolute: 0 10*3/uL (ref 0.0–0.1)
Basophils Relative: 0 %
Eosinophils Absolute: 0 10*3/uL (ref 0.0–0.5)
Eosinophils Relative: 1 %
HCT: 33 % — ABNORMAL LOW (ref 36.0–46.0)
Hemoglobin: 10.5 g/dL — ABNORMAL LOW (ref 12.0–15.0)
Immature Granulocytes: 0 %
Lymphocytes Relative: 35 %
Lymphs Abs: 2.4 10*3/uL (ref 0.7–4.0)
MCH: 33 pg (ref 26.0–34.0)
MCHC: 31.8 g/dL (ref 30.0–36.0)
MCV: 103.8 fL — ABNORMAL HIGH (ref 80.0–100.0)
Monocytes Absolute: 1 10*3/uL (ref 0.1–1.0)
Monocytes Relative: 14 %
Neutro Abs: 3.4 10*3/uL (ref 1.7–7.7)
Neutrophils Relative %: 50 %
Platelets: 286 10*3/uL (ref 150–400)
RBC: 3.18 MIL/uL — ABNORMAL LOW (ref 3.87–5.11)
RDW: 19.9 % — ABNORMAL HIGH (ref 11.5–15.5)
WBC: 6.7 10*3/uL (ref 4.0–10.5)
nRBC: 0 % (ref 0.0–0.2)

## 2023-12-24 LAB — MAGNESIUM: Magnesium: 1.5 mg/dL — ABNORMAL LOW (ref 1.7–2.4)

## 2023-12-24 MED ORDER — SODIUM CHLORIDE 0.9% FLUSH
10.0000 mL | Freq: Once | INTRAVENOUS | Status: AC
  Administered 2023-12-24: 10 mL via INTRAVENOUS

## 2023-12-24 MED ORDER — DEXAMETHASONE 0.1 % OP SUSP
1.0000 [drp] | Freq: Once | OPHTHALMIC | Status: AC
Start: 2023-12-24 — End: 2023-12-24
  Administered 2023-12-24: 1 [drp] via OPHTHALMIC
  Filled 2023-12-24: qty 5

## 2023-12-24 MED ORDER — SODIUM CHLORIDE 0.9 % IV SOLN
INTRAVENOUS | Status: DC
Start: 2023-12-24 — End: 2023-12-24

## 2023-12-24 MED ORDER — TISOTUMAB VEDOTIN-TFTV 40 MG IV SOLR
2.0000 mg/kg | Freq: Once | INTRAVENOUS | Status: AC
  Administered 2023-12-24: 160 mg via INTRAVENOUS
  Filled 2023-12-24: qty 16

## 2023-12-24 MED ORDER — HEPARIN SOD (PORK) LOCK FLUSH 100 UNIT/ML IV SOLN
500.0000 [IU] | Freq: Once | INTRAVENOUS | Status: AC | PRN
  Administered 2023-12-24: 500 [IU]

## 2023-12-24 MED ORDER — MAGNESIUM OXIDE -MG SUPPLEMENT 400 (240 MG) MG PO TABS: 400.0000 mg | ORAL_TABLET | Freq: Three times a day (TID) | ORAL | 3 refills | Status: DC

## 2023-12-24 MED ORDER — MAGNESIUM SULFATE 2 GM/50ML IV SOLN
2.0000 g | Freq: Once | INTRAVENOUS | Status: AC
  Administered 2023-12-24: 2 g via INTRAVENOUS
  Filled 2023-12-24: qty 50

## 2023-12-24 MED ORDER — OXYCODONE HCL 5 MG PO TABS: 5.0000 mg | ORAL_TABLET | Freq: Four times a day (QID) | ORAL | 0 refills | Status: DC | PRN

## 2023-12-24 MED ORDER — SODIUM CHLORIDE 0.9% FLUSH
10.0000 mL | INTRAVENOUS | Status: DC | PRN
Start: 2023-12-24 — End: 2023-12-24
  Administered 2023-12-24: 10 mL

## 2023-12-24 MED ORDER — BRIMONIDINE TARTRATE 0.1 % OP SOLN
3.0000 [drp] | Freq: Once | OPHTHALMIC | Status: AC
  Administered 2023-12-24: 3 [drp] via OPHTHALMIC
  Filled 2023-12-24: qty 0.2

## 2023-12-24 MED ORDER — PALONOSETRON HCL INJECTION 0.25 MG/5ML
0.2500 mg | Freq: Once | INTRAVENOUS | Status: AC
Start: 2023-12-24 — End: 2023-12-24
  Administered 2023-12-24: 0.25 mg via INTRAVENOUS
  Filled 2023-12-24: qty 5

## 2023-12-24 NOTE — Patient Instructions (Signed)

## 2023-12-24 NOTE — Progress Notes (Signed)
 Eye exam yesterday- reviewed by Dr Cheree Cords, OK to proceed with Tivdak .  V.O. Dr Davina Ester, PharmD

## 2023-12-24 NOTE — Progress Notes (Signed)
 Patient presents today for chemotherapy infusion. Patient is in satisfactory condition with no new complaints voiced.  Vital signs are stable.  Labs reviewed by Dr. Cheree Cords during the office visit and all labs are within treatment parameters.  Magnesium  today is 1.5.  We will give magnesium  sulfate 2 grams IV x one dose today per standing orders by Dr. Cheree Cords.  We will proceed with treatment per MD orders.   Patient tolerated treatment well with no complaints voiced.  Patient left ambulatory in stable condition.  Vital signs stable at discharge.  Follow up as scheduled.

## 2023-12-24 NOTE — Patient Instructions (Signed)
 [facility] [place] - A DEPT OF [facility]  Discharge Instructions: Thank you for choosing [facility] to provide your oncology and hematology care.  If you have a lab appointment with the Cancer Center - please note that after April 8th, 2024, all labs will be drawn in the cancer center.  You do not have to check in or register with the main entrance as you have in the past but will complete your check-in in the cancer center.  Wear comfortable clothing and clothing appropriate for easy access to any Portacath or PICC line.   We strive to give you quality time with your provider. You may need to reschedule your appointment if you arrive late (15 or more minutes).  Arriving late affects you and other patients whose appointments are after yours.  Also, if you miss three or more appointments without notifying the office, you may be dismissed from the clinic at the provider's discretion.      For prescription refill requests, have your pharmacy contact our office and allow 72 hours for refills to be completed.    Today you received the following chemotherapy and/or immunotherapy agents Tivdak.  Tisotumab Vedotin Injection What is this medication? TISOTUMAB VEDOTIN (tye SOT ue mab ve DOE tin) treats cervical cancer. It works by blocking a protein that causes cancer cells to grow and multiply. This helps to slow or stop the spread of cancer cells. This medicine may be used for other purposes; ask your health care provider or pharmacist if you have questions. COMMON BRAND NAME(S): Tivdak What should I tell my care team before I take this medication? They need to know if you have any of these conditions: Bleeding disorder Eye disease Liver disease Lung disease Tingling of the fingers or toes or other nerve disorder Vision problems An unusual or allergic reaction to tisotumab vedotin, other medications, foods, dyes, or preservatives If you or your partner are  pregnant or trying to get pregnant Breast-feeding How should I use this medication? This medication is injected into a vein. It is given by your care team in a hospital or clinic setting. A special MedGuide will be given to you before each treatment. Be sure to read this information carefully each time. Talk to your care team about the use of this medication in children. Special care may be needed. Overdosage: If you think you have taken too much of this medicine contact a poison control center or emergency room at once. NOTE: This medicine is only for you. Do not share this medicine with others. What if I miss a dose? Keep appointments for follow-up doses. It is important not to miss your dose. Call your care team if you are unable to keep an appointment. What may interact with this medication? Certain antibiotics, such as clarithromycin, telithromycin Certain antivirals for HIV or hepatitis Certain medications for fungal infections, such as itraconazole, ketoconazole, posaconazole, voriconazole Grapefruit juice This list may not describe all possible interactions. Give your health care provider a list of all the medicines, herbs, non-prescription drugs, or dietary supplements you use. Also tell them if you smoke, drink alcohol, or use illegal drugs. Some items may interact with your medicine. What should I watch for while using this medication? Your condition will be monitored carefully while you are receiving this medication. This medication may make you feel generally unwell. This is not uncommon as chemotherapy can affect healthy cells as well as cancer cells. Report any side  effects. Continue your course of treatment even though you feel ill unless your care team tells you to stop. This medication can cause serious eye damage. Tell your care team right away if you have any change in your eyesight. Your vision will be tested before and during use of this medication. Your care team will  prescribe 3 different types of eye drops before you start treatment. Bring the eye drops with you to each infusion and use them as directed by your care team. Do not wear contact lenses during treatment unless you are told to use them by your eye doctor. This medication may increase your risk of getting an infection. Call your care team for advice if you get a fever, chills, sore throat, or other symptoms of a cold or flu. Do not treat yourself. Try to avoid being around people who are sick. Avoid taking medications that contain aspirin, acetaminophen, ibuprofen, naproxen, or ketoprofen unless instructed by your care team. These medications may hide a fever. This medication may increase your risk to bruise or bleed. Call your care team if you notice any unusual bleeding. This medication may cause serious skin reactions. They can happen weeks to months after starting the medication. Contact your care team right away if you notice fevers or flu-like symptoms with a rash. The rash may be red or purple and then turn into blisters or peeling of the skin. You may also notice a red rash with swelling of the face, lips, or lymph nodes in your neck or under your arms. Talk to your care team if you or your partner may be pregnant. Serious birth defects can occur if you take this medication during pregnancy and for 2 months after the last dose. You will need a negative pregnancy test before starting this medication. Contraception is recommended while taking this medication and for 2 months after the last dose. Your care team can help you find the option that works for you. If your partner can get pregnant, use a condom during sex while taking this medication and for 4 months after the last dose. Do not breastfeed while taking this medication and for 3 weeks after the last dose. This medication may cause infertility. Talk to your care team if you are concerned about your fertility. What side effects may I notice from  receiving this medication? Side effects that you should report to your care team as soon as possible: Allergic reactions--skin rash, itching, hives, swelling of the face, lips, tongue, or throat Bleeding--bloody or black, tar-like stools, vomiting blood or Zanovia Rotz material that looks like coffee grounds, red or dark Allon Costlow urine, small red or purple spots on skin, unusual bruising or bleeding Dry cough, shortness of breath or trouble breathing Eye pain, redness, irritation, or discharge with blurry or decreased vision Frequent or severe nosebleeds Heavy vaginal bleeding Pain, tingling, or numbness in the hands or feet Rash, fever, and swollen lymph nodes Redness, blistering, peeling, or loosening of the skin, including inside the mouth Side effects that usually do not require medical attention (report these to your care team if they continue or are bothersome): Diarrhea Dry eyes Fatigue Hair loss Nausea This list may not describe all possible side effects. Call your doctor for medical advice about side effects. You may report side effects to FDA at 1-800-FDA-1088. Where should I keep my medication? This medication is given in a hospital or clinic. It will not be stored at home. NOTE: This sheet is a summary. It may not  cover all possible information. If you have questions about this medicine, talk to your doctor, pharmacist, or health care provider.  2024 Elsevier/Gold Standard (2023-07-31 00:00:00)       To help prevent nausea and vomiting after your treatment, we encourage you to take your nausea medication as directed.  BELOW ARE SYMPTOMS THAT SHOULD BE REPORTED IMMEDIATELY: *FEVER GREATER THAN 100.4 F (38 C) OR HIGHER *CHILLS OR SWEATING *NAUSEA AND VOMITING THAT IS NOT CONTROLLED WITH YOUR NAUSEA MEDICATION *UNUSUAL SHORTNESS OF BREATH *UNUSUAL BRUISING OR BLEEDING *URINARY PROBLEMS (pain or burning when urinating, or frequent urination) *BOWEL PROBLEMS (unusual diarrhea,  constipation, pain near the anus) TENDERNESS IN MOUTH AND THROAT WITH OR WITHOUT PRESENCE OF ULCERS (sore throat, sores in mouth, or a toothache) UNUSUAL RASH, SWELLING OR PAIN  UNUSUAL VAGINAL DISCHARGE OR ITCHING   Items with * indicate a potential emergency and should be followed up as soon as possible or go to the Emergency Department if any problems should occur.  Please show the CHEMOTHERAPY ALERT CARD or IMMUNOTHERAPY ALERT CARD at check-in to the Emergency Department and triage nurse.  Should you have questions after your visit or need to cancel or reschedule your appointment, please contact [facility] [place] - A DEPT OF [facility] [phone]  and follow the prompts.  Office hours are 8:00 a.m. to 4:30 p.m. Monday - Friday. Please note that voicemails left after 4:00 p.m. may not be returned until the following business day.  We are closed weekends and major holidays. You have access to a nurse at all times for urgent questions. Please call the main number to the clinic [phone] and follow the prompts.  For any non-urgent questions, you may also contact your provider using MyChart. We now offer e-Visits for anyone 7 and older to request care online for non-urgent symptoms. For details visit [email or web address].   Also download the MyChart app! Go to the app store, search "MyChart", open the app, select [place], and log in with your MyChart username and password.

## 2023-12-24 NOTE — Progress Notes (Signed)
 Patient has been examined by Dr. Ellin Saba. Vital signs (HR 115) and labs have been reviewed by MD - ANC, Creatinine, LFTs, hemoglobin, and platelets are within treatment parameters per M.D. - pt may proceed with treatment.  Primary RN and pharmacy notified.

## 2023-12-24 NOTE — Progress Notes (Signed)
 Kristin Carson [address], [name]    Clinic Day:  12/24/23     Referring physician: Tobi Fortes, MD  Patient Care Team: Tobi Fortes, MD as PCP - General (Internal Medicine) Paulett Boros, MD as Medical Oncologist (Medical Oncology) Gerhard Knuckles, RN as Oncology Nurse Navigator (Medical Oncology)   ASSESSMENT & PLAN:   Assessment: 1.  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 - CTAP (08/30/2022): Irregularity of the endometrium extending through the cervix with foci of gas in the cervix.  Multifocal omental and peritoneal metastasis.  Metastatic left periaortic, aortocaval, left external iliac and right common iliac lymph nodes.  Multiple bilateral lung nodules at bases.  12 mm hypodensity in the liver near the falciform ligament. - Endometrial biopsy (09/09/2022): Invasive moderately to poorly differentiated squamous cell carcinoma with abundant necrosis.  Cervix biopsy: Invasive and in situ moderate to poorly differentiated squamous cell carcinoma.  High risk HPV positive. - NGS: PD-L1 (22 C3): CPS 2, HER2 IHC negative.  PIK3CA exon 10 pathogenic variant.  MS-stable.  TMB-low.  FANCM and SDHA pathogenic variants. - PD-L1 CPS: 8% - Cycle 1 of cisplatin , paclitaxel , bevacizumab  on 10/02/2022, pembrolizumab  added with cycle 2 on 10/23/2022 through 09/17/2023 (cycle 12) discontinued for progression. - CT CAP (10/18/2022): Increased size and number of bilateral lung nodules.  New subtle hepatic hypodensity suspicious for metastatic disease.  Enlarged left periportal lymph node. 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   2.  Social/family history: - She is married and lives at home with her husband and roommate.  She has never worked.  She is current active smoker, half pack per day, for the last 28 years.  She occasionally smokes marijuana. - Mother died of lung cancer.  Father had head and neck cancer.  Maternal grandmother had cancer.  Paternal uncle died of cancer.  Maternal uncle also had cancer.    Plan: 1.  Metastatic cervical cancer to the lungs, peritoneum and liver: - CT scan on 10/19/2023 showed progression. - Tivdak  cycle 1 started on 11/12/2023. - She was evaluated by her optometrist yesterday.  She was reportedly told her eyes were dry and given eyedrops to use. - Reviewed labs today: Normal LFTs.  CBC grossly normal. - Reviewed records from the ER visit and lumbar x-ray from 12/19/2023. - She may proceed with cycle 3 of Tivdak .  RTC 3 weeks for follow-up.  Will plan to repeat scans after cycle 4.   2.  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  Hemoglobin is 10.5 today.  Last ferritin was 400 and saturation 20.   3.  Peripheral neuropathy: - Continue Lyrica  3 times daily and oxycodone  daily as needed.  She has reported low back pain going down the left leg slightly worse.  She was evaluated in the ER on 12/19/2023 for it.  Today the pain is better.   4.  Hypertension: - Continue amlodipine  10 mg daily.  Blood pressure is well-controlled.  5.  Hypomagnesemia: - She is not taking magnesium  supplements.  Magnesium  is low at 1.5.-She will receive IV magnesium .  She will start back on magnesium  3 times  daily.        No orders of the defined types were placed in this encounter.     [name],acting as a Neurosurgeon for Paulett Boros, MD.,have documented all relevant documentation on the behalf of Paulett Boros, MD,as directed by  Paulett Boros, MD while in the presence of Paulett Boros, MD.  I, Paulett Boros MD,  have reviewed the above documentation for accuracy and completeness, and I agree with the above.     Paulett Boros, MD   4/24/20255:05 PM  CHIEF COMPLAINT:   Diagnosis: metastatic cervical squamous cell carcinoma    Cancer Staging  Cervical cancer [facility]) Staging form: Cervix Uteri, AJCC Version 9 - Clinical stage from 09/19/2022: FIGO Stage IVB (cT4, cN2a, cM1) - Unsigned    Prior Therapy: none  Current Therapy:  Cisplatin  + Paclitaxel  + Bevacizumab , Keytruda    HISTORY OF PRESENT ILLNESS:   Oncology History Overview Note  PD-L1 CPS 8%   Cervical cancer (HCC)  09/19/2022 Initial Diagnosis   Cervical cancer (HCC)   10/02/2022 - 09/17/2023 Chemotherapy   Patient is on Treatment Plan : CERVICAL Cisplatin  50 mg/m2 + PAClitaxel  135 mg/m2 + Bevacizumab  15 mg/kg + Keytruda  q21d     10/29/2023 Genetic Testing   Negative genetic testing on the CancerNext+RNAinsight.  The report date is 10/29/2023.  The [facility] CancerNext+RNAinsight Panel includes sequencing, rearrangement analysis, and RNA analysis for the following 39 genes: APC, ATM, BAP1, BARD1, BMPR1A, BRCA1, BRCA2, BRIP1, CDH1, CDKN2A, CHEK2, FH, FLCN, MET, MLH1, MSH2, MSH6, MUTYH, NF1, NTHL1, PALB2, PMS2, PTEN, RAD51C, RAD51D, SMAD4, STK11, TP53, TSC1, TSC2, and VHL (sequencing and deletion/duplication); AXIN2, HOXB13, MBD4, MSH3, POLD1 and POLE (sequencing only); EPCAM and GREM1 (deletion/duplication only).   11/12/2023 -  Chemotherapy   Patient is on Treatment Plan : CERVICAL Tisotumab vedotin -tftv (Tivdak ) q21d     Uterine cancer (HCC)  10/15/2023 Initial Diagnosis   Uterine cancer (HCC)   10/29/2023 Genetic Testing   Negative genetic testing on the CancerNext+RNAinsight.  The report date is 10/29/2023.  The [facility] CancerNext+RNAinsight Panel includes sequencing, rearrangement analysis, and RNA analysis for the following 39 genes: APC, ATM, BAP1, BARD1, BMPR1A, BRCA1, BRCA2, BRIP1, CDH1, CDKN2A, CHEK2, FH, FLCN, MET, MLH1,  MSH2, MSH6, MUTYH, NF1, NTHL1, PALB2, PMS2, PTEN, RAD51C, RAD51D, SMAD4, STK11, TP53, TSC1, TSC2, and VHL (sequencing and deletion/duplication); AXIN2, HOXB13, MBD4, MSH3, POLD1 and POLE (sequencing only); EPCAM and GREM1 (deletion/duplication only).      INTERVAL HISTORY:   Kristin Carson is a 53 y.o. female presenting to clinic today for follow up of metastatic cervical squamous cell carcinoma. She was last seen by me on 12/03/23.  Since her last visit, she presented to the ED on 12/19/23 for myalgia with imaging finding no etiology of pain. She was prescribed percocet and discharged.  Today, she states that she is doing well overall. Her appetite level is at 0%. Her energy level is at 0%.   Kristin Carson saw the optometrist yesterday and states she was not given any paperwork from that visit. She reports occasional blurry vision. She denies any other eye issues. She was given eye drops for dry eyes by the optometrist.   She has not been taking magnesium  supplements. She is taking all other medications as prescribed.    Kristin Carson notes decreased appetite and reports she presented to the ED for pain from the back to the left leg, as well as whole body aches. She is taking Lyrica  and oxycodone . Kristin Carson requires a refill of oxycodone .    Kristin Carson  notes occasional mild diarrhea that is well controlled with Imodium .   PAST MEDICAL HISTORY:   Past Medical History: Past Medical History:  Diagnosis Date   Anemia    Asthma    Bronchitis    Cancer (HCC)    Cervical   CHF (congestive heart failure) (HCC)    COPD (chronic obstructive pulmonary disease) (HCC)    Depression    GERD (gastroesophageal reflux disease)    Headache    Hypertension    Port-A-Cath in place 09/25/2022    Surgical History: Past Surgical History:  Procedure Laterality Date   BREAST SURGERY     CESAREAN SECTION     IR IMAGING GUIDED PORT INSERTION  09/30/2022   LEG SURGERY      Social History: Social History   Socioeconomic History    Marital status: Married    Spouse name: Not on file   Number of children: Not on file   Years of education: Not on file   Highest education level: Not on file  Occupational History   Not on file  Tobacco Use   Smoking status: Every Day    Current packs/day: 0.25    Average packs/day: 0.3 packs/day for 23.0 years (5.8 ttl pk-yrs)    Types: Cigarettes   Smokeless tobacco: Never  Vaping Use   Vaping status: Never Used  Substance and Sexual Activity   Alcohol use: Yes    Comment: occ   Drug use: Yes    Types: Marijuana   Sexual activity: Yes    Birth control/protection: None  Other Topics Concern   Not on file  Social History Narrative   Not on file   Social Drivers of Health   Financial Resource Strain: Low Risk  (09/09/2022)   Overall Financial Resource Strain (CARDIA)    Difficulty of Paying Living Expenses: Not hard at all  Food Insecurity: No Food Insecurity (10/26/2023)   Hunger Vital Sign    Worried About Running Out of Food in the Last Year: Never true    Ran Out of Food in the Last Year: Never true  Transportation Needs: No Transportation Needs (10/26/2023)   PRAPARE - Administrator, Civil Service (Medical): No    Lack of Transportation (Non-Medical): No  Physical Activity: Insufficiently Active (09/09/2022)   Exercise Vital Sign    Days of Exercise per Week: 2 days    Minutes of Exercise per Session: 20 min  Stress: No Stress Concern Present (09/09/2022)   [employer] of Occupational Health - Occupational Stress Questionnaire    Feeling of Stress : Only a little  Social Connections: Socially Isolated (09/09/2022)   Social Connection and Isolation Panel [NHANES]    Frequency of Communication with Friends and Family: Three times a week    Frequency of Social Gatherings with Friends and Family: More than three times a week    Attends Religious Services: Never    Database administrator or Organizations: No    Attends Banker Meetings: Never     Marital Status: Separated  Intimate Partner Violence: Not At Risk (10/26/2023)   Humiliation, Afraid, Rape, and Kick questionnaire    Fear of Current or Ex-Partner: No    Emotionally Abused: No    Physically Abused: No    Sexually Abused: No    Family History: Family History  Problem Relation Age of Onset   Dermatomyositis Mother    Hypertension Mother    Cancer Mother  lung   Cancer Father        lung   Heart disease Father    Lung cancer Paternal Uncle    Lung cancer Maternal Uncle    Colon cancer Neg Hx    Breast cancer Neg Hx    Ovarian cancer Neg Hx    Endometrial cancer Neg Hx    Pancreatic cancer Neg Hx    Prostate cancer Neg Hx     Current Medications:  Current Outpatient Medications:    acetaminophen  (TYLENOL ) 500 MG tablet, Take 2 tablets (1,000 mg total) by mouth every 8 (eight) hours as needed for mild pain (pain score 1-3), fever or headache., Disp: , Rfl:    amLODipine  (NORVASC ) 10 MG tablet, Take 1 tablet (10 mg total) by mouth daily., Disp: 90 tablet, Rfl: 3   brimonidine  (ALPHAGAN  P) 0.1 % SOLN, Place 3 drops into both eyes immediately prior to start of Tivdak  infusion, Disp: 5 mL, Rfl: 11   budesonide -formoterol  (SYMBICORT ) 160-4.5 MCG/ACT inhaler, Inhale 2 puffs into the lungs in the morning and at bedtime., Disp: 1 each, Rfl: 3   Carboxymethylcellulose Sod PF 0.5 % SOLN, Place 2 drops into both eyes 4 (four) times daily. Continue for 30 days after last dose of Tivdak , Disp: 30 each, Rfl: 11   cetirizine  (ZYRTEC  ALLERGY) 10 MG tablet, Take 1 tablet (10 mg total) by mouth daily., Disp: 30 tablet, Rfl: 1   dexamethasone  (DECADRON ) 0.1 % ophthalmic suspension, Instill 1 drop into both eyes immediately prior to Tivdak  and in the morning, in the evening and before bedtime for 72 hours after infusion, Disp: 5 mL, Rfl: 11   levalbuterol  (XOPENEX  HFA) 45 MCG/ACT inhaler, Inhale 2 puffs into the lungs every 8 (eight) hours as needed for wheezing or shortness  of breath., Disp: 1 each, Rfl: 2   lidocaine -prilocaine  (EMLA ) cream, Apply a quarter-sized amount to port a cath site and cover with plastic wrap 1 hour prior to infusion appointments, Disp: 30 g, Rfl: 3   megestrol  (MEGACE ) 400 MG/10ML suspension, Take 10 mLs (400 mg total) by mouth 2 (two) times daily., Disp: 480 mL, Rfl: 3   methocarbamol  (ROBAXIN ) 500 MG tablet, Take 1 tablet (500 mg total) by mouth 2 (two) times daily as needed for muscle spasms., Disp: 20 tablet, Rfl: 0   methylPREDNISolone  (MEDROL  DOSEPAK) 4 MG TBPK tablet, Taper over 6 days, Disp: 21 tablet, Rfl: 0   pantoprazole  (PROTONIX ) 40 MG tablet, Take 1 tablet (40 mg total) by mouth daily., Disp: 90 tablet, Rfl: 1   predniSONE  (DELTASONE ) 20 MG tablet, Take 3 tablets by mouth daily x 1 day; then 2 tablet by mouth daily x 2 days; then 1 tablet by mouth daily x 3 days; then half tablet by mouth daily x 3 days and stop prednisone ., Disp: 12 tablet, Rfl: 0   pregabalin  (LYRICA ) 200 MG capsule, Take 1 capsule (200 mg total) by mouth 3 (three) times daily., Disp: 180 capsule, Rfl: 0   magnesium  oxide (MAG-OX) 400 (240 Mg) MG tablet, Take 1 tablet (400 mg total) by mouth 3 (three) times daily., Disp: 90 tablet, Rfl: 3   oxyCODONE  (OXY IR/ROXICODONE ) 5 MG immediate release tablet, Take 1 tablet (5 mg total) by mouth every 6 (six) hours as needed for severe pain (pain score 7-10)., Disp: 120 tablet, Rfl: 0 No current facility-administered medications for this visit.  Facility-Administered Medications Ordered in Other Visits:    0.9 %  sodium chloride  infusion, , Intravenous, Continuous, Rozena Fierro,  Tereasa Felty, MD, Stopped at 12/24/23 1427   sodium chloride  flush (NS) 0.9 % injection 10 mL, 10 mL, Intracatheter, PRN, Madisan Bice, MD, 10 mL at 12/24/23 1431   Allergies: Allergies  Allergen Reactions   Carrot [Daucus Carota] Hives   Lisinopril -Hydrochlorothiazide      Oral swelling   Ibuprofen  Other (See Comments)    Oral swelling    Other Itching and Other (See Comments)    Hair dye, blisters, pus-filled, soreness   Tramadol  Hives   Erythromycin Hives    REVIEW OF SYSTEMS:   Review of Systems  Constitutional:  Negative for chills, fatigue and fever.  HENT:   Negative for lump/mass, mouth sores, nosebleeds, sore throat and trouble swallowing.   Eyes:  Negative for eye problems.       +occasional blurry vision  Respiratory:  Negative for cough and shortness of breath.   Cardiovascular:  Negative for chest pain, leg swelling and palpitations.  Gastrointestinal:  Positive for diarrhea. Negative for abdominal pain, constipation, nausea and vomiting.  Genitourinary:  Negative for bladder incontinence, difficulty urinating, dysuria, frequency, hematuria and nocturia.   Musculoskeletal:  Negative for arthralgias, back pain, flank pain, myalgias and neck pain.  Skin:  Negative for itching and rash.  Neurological:  Negative for dizziness, headaches and numbness.       +pain in the bilateral feet, 7/10 severity  Hematological:  Does not bruise/bleed easily.  Psychiatric/Behavioral:  Negative for depression, sleep disturbance and suicidal ideas. The patient is not nervous/anxious.   All other systems reviewed and are negative.    VITALS:   Last menstrual period 08/15/2022.  Wt Readings from Last 3 Encounters:  12/24/23 162 lb 4.1 oz (73.6 kg)  12/19/23 166 lb (75.3 kg)  12/03/23 168 lb 14 oz (76.6 kg)    There is no height or weight on file to calculate BMI.  Performance status (ECOG): 1 - Symptomatic but completely ambulatory  PHYSICAL EXAM:   Physical Exam Vitals and nursing note reviewed. Exam conducted with a chaperone present.  Constitutional:      Appearance: Normal appearance.  Cardiovascular:     Rate and Rhythm: Normal rate and regular rhythm.     Pulses: Normal pulses.     Heart sounds: Normal heart sounds.  Pulmonary:     Effort: Pulmonary effort is normal.     Breath sounds: Normal breath  sounds.  Abdominal:     Palpations: Abdomen is soft. There is no hepatomegaly, splenomegaly or mass.     Tenderness: There is no abdominal tenderness.  Musculoskeletal:     Right lower leg: No edema.     Left lower leg: No edema.  Lymphadenopathy:     Cervical: No cervical adenopathy.     Right cervical: No superficial, deep or posterior cervical adenopathy.    Left cervical: No superficial, deep or posterior cervical adenopathy.     Upper Body:     Right upper body: No supraclavicular or axillary adenopathy.     Left upper body: No supraclavicular or axillary adenopathy.  Neurological:     General: No focal deficit present.     Mental Status: She is alert and oriented to person, place, and time.  Psychiatric:        Mood and Affect: Mood normal.        Behavior: Behavior normal.     LABS:      Latest Ref Rng & Units 12/24/2023   10:19 AM 12/19/2023    5:30 PM 12/03/2023  8:01 AM  CBC  WBC 4.0 - 10.5 K/uL 6.7  11.2  6.2   Hemoglobin 12.0 - 15.0 g/dL 82.9  56.2  9.0   Hematocrit 36.0 - 46.0 % 33.0  31.5  28.3   Platelets 150 - 400 K/uL 286  311  229       Latest Ref Rng & Units 12/24/2023   10:19 AM 12/19/2023    5:30 PM 12/03/2023    8:01 AM  CMP  Glucose 70 - 99 mg/dL 94  82  84   BUN 6 - 20 mg/dL 8  8  9    Creatinine 0.44 - 1.00 mg/dL 1.30  8.65  7.84   Sodium 135 - 145 mmol/L 136  135  134   Potassium 3.5 - 5.1 mmol/L 3.4  3.8  3.6   Chloride 98 - 111 mmol/L 102  99  100   CO2 22 - 32 mmol/L 23  27  23    Calcium  8.9 - 10.3 mg/dL 9.2  9.0  8.7   Total Protein 6.5 - 8.1 g/dL 6.6   6.9   Total Bilirubin 0.0 - 1.2 mg/dL 0.6   0.6   Alkaline Phos 38 - 126 U/L 63   56   AST 15 - 41 U/L 21   20   ALT 0 - 44 U/L 8   11      No results found for: "CEA1", "CEA" / No results found for: "CEA1", "CEA" No results found for: "PSA1" No results found for: "ONG295" No results found for: "CAN125"  No results found for: "TOTALPROTELP", "ALBUMINELP", "A1GS", "A2GS", "BETS",  "BETA2SER", "GAMS", "MSPIKE", "SPEI" Lab Results  Component Value Date   TIBC 318 11/12/2023   TIBC 412 09/22/2022   FERRITIN 400 (H) 11/12/2023   FERRITIN 35 09/22/2022   IRONPCTSAT 20 11/12/2023   IRONPCTSAT 7 (L) 09/22/2022   No results found for: "LDH"   STUDIES:   DG Lumbar Spine Complete Result Date: 12/19/2023 CLINICAL DATA:  Trauma. History of stage IV cancer and generalized body aches. EXAM: LUMBAR SPINE - COMPLETE 4+ VIEW COMPARISON:  02/02/2020. FINDINGS: There is no evidence of acute lumbar spine fracture. There is mild retrolisthesis at L5-S1. Intervertebral disc spaces are maintained. Atherosclerotic calcification of the aorta is noted. IMPRESSION: No acute fracture in the lumbar spine. Electronically Signed   By: Wyvonnia Heimlich M.D.   On: 12/19/2023 19:06

## 2024-01-04 DIAGNOSIS — J449 Chronic obstructive pulmonary disease, unspecified: Secondary | ICD-10-CM | POA: Diagnosis not present

## 2024-01-05 DIAGNOSIS — J449 Chronic obstructive pulmonary disease, unspecified: Secondary | ICD-10-CM | POA: Diagnosis not present

## 2024-01-06 DIAGNOSIS — J449 Chronic obstructive pulmonary disease, unspecified: Secondary | ICD-10-CM | POA: Diagnosis not present

## 2024-01-07 DIAGNOSIS — J449 Chronic obstructive pulmonary disease, unspecified: Secondary | ICD-10-CM | POA: Diagnosis not present

## 2024-01-08 DIAGNOSIS — J449 Chronic obstructive pulmonary disease, unspecified: Secondary | ICD-10-CM | POA: Diagnosis not present

## 2024-01-11 DIAGNOSIS — Z419 Encounter for procedure for purposes other than remedying health state, unspecified: Secondary | ICD-10-CM | POA: Diagnosis not present

## 2024-01-11 DIAGNOSIS — J449 Chronic obstructive pulmonary disease, unspecified: Secondary | ICD-10-CM | POA: Diagnosis not present

## 2024-01-12 DIAGNOSIS — J449 Chronic obstructive pulmonary disease, unspecified: Secondary | ICD-10-CM | POA: Diagnosis not present

## 2024-01-13 DIAGNOSIS — J449 Chronic obstructive pulmonary disease, unspecified: Secondary | ICD-10-CM | POA: Diagnosis not present

## 2024-01-13 DIAGNOSIS — Z7689 Persons encountering health services in other specified circumstances: Secondary | ICD-10-CM | POA: Diagnosis not present

## 2024-01-14 ENCOUNTER — Inpatient Hospital Stay: Admitting: Hematology

## 2024-01-14 ENCOUNTER — Inpatient Hospital Stay

## 2024-01-14 DIAGNOSIS — J449 Chronic obstructive pulmonary disease, unspecified: Secondary | ICD-10-CM | POA: Diagnosis not present

## 2024-01-14 NOTE — Progress Notes (Incomplete)
 [facility] [address] [place] [place], [place] [address]    Clinic Day:  01/14/24     Referring physician: Tobi Fortes, MD  Patient Care Team: Tobi Fortes, MD as PCP - General (Internal Medicine) Paulett Boros, MD as Medical Oncologist (Medical Oncology) Gerhard Knuckles, RN as Oncology Nurse Navigator (Medical Oncology)   ASSESSMENT & PLAN:   Assessment: 1.  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 - CTAP (08/30/2022): Irregularity of the endometrium extending through the cervix with foci of gas in the cervix.  Multifocal omental and peritoneal metastasis.  Metastatic left periaortic, aortocaval, left external iliac and right common iliac lymph nodes.  Multiple bilateral lung nodules at bases.  12 mm hypodensity in the liver near the falciform ligament. - Endometrial biopsy (09/09/2022): Invasive moderately to poorly differentiated squamous cell carcinoma with abundant necrosis.  Cervix biopsy: Invasive and in situ moderate to poorly differentiated squamous cell carcinoma.  High risk HPV positive. - NGS: PD-L1 (22 C3): CPS 2, HER2 IHC negative.  PIK3CA exon 10 pathogenic variant.  MS-stable.  TMB-low.  FANCM and SDHA pathogenic variants. - PD-L1 CPS: 8% - Cycle 1 of cisplatin , paclitaxel , bevacizumab  on 10/02/2022, pembrolizumab  added with cycle 2 on 10/23/2022 through 09/17/2023 (cycle 12) discontinued for progression. - CT CAP (10/18/2022): Increased size and number of bilateral lung nodules.  New subtle hepatic hypodensity suspicious for metastatic disease.  Enlarged left periportal lymph node. 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   2.  Social/family history: - She is married and lives at home with her husband and roommate.  She has never worked.  She is current active smoker, half pack per day, for the last 28 years.  She occasionally smokes marijuana. - Mother died of lung cancer.  Father had head and neck cancer.  Maternal grandmother had cancer.  Paternal uncle died of cancer.  Maternal uncle also had cancer.    Plan: 1.  Metastatic cervical cancer to the lungs, peritoneum and liver: - CT scan on 10/19/2023 showed progression. - Tivdak  cycle 1 started on 11/12/2023. - She was evaluated by her optometrist yesterday.  She was reportedly told her eyes were dry and given eyedrops to use. - Reviewed labs today: Normal LFTs.  CBC grossly normal. - Reviewed records from the ER visit and lumbar x-ray from 12/19/2023. - She may proceed with cycle 3 of Tivdak .  RTC 3 weeks for follow-up.  Will plan to repeat scans after cycle 4.   2.  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  Hemoglobin is 10.5 today.  Last ferritin was 400 and saturation 20.   3.  Peripheral neuropathy: - Continue Lyrica  3 times daily and oxycodone  daily as needed.  She has reported low back pain going down the left leg slightly worse.  She was evaluated in the ER on 12/19/2023 for it.  Today the pain is better.   4.  Hypertension: - Continue amlodipine  10 mg daily.  Blood pressure is well-controlled.  5.  Hypomagnesemia: - She is not taking magnesium  supplements.  Magnesium  is low at 1.5.-She will receive IV magnesium .  She will start back on magnesium  3 times  daily.        No orders of the defined types were placed in this encounter.     Kristin Carson,acting as a Neurosurgeon for Paulett Boros, MD.,have documented all relevant documentation on the behalf of Paulett Boros, MD,as directed by  Paulett Boros, MD while in the presence of Paulett Boros, MD.  ***     Holloway R Carson    5/15/20258:20 AM  CHIEF COMPLAINT:   Diagnosis: metastatic cervical squamous cell carcinoma    Cancer Staging  Cervical cancer [facility]) Staging form: Cervix Uteri, AJCC Version 9 - Clinical stage from 09/19/2022: FIGO Stage IVB (cT4, cN2a, cM1) - Unsigned    Prior Therapy: none  Current Therapy:  Cisplatin  + Paclitaxel  + Bevacizumab , Keytruda    HISTORY OF PRESENT ILLNESS:   Oncology History Overview Note  PD-L1 CPS 8%   Cervical cancer (HCC)  09/19/2022 Initial Diagnosis   Cervical cancer (HCC)   10/02/2022 - 09/17/2023 Chemotherapy   Patient is on Treatment Plan : CERVICAL Cisplatin  50 mg/m2 + PAClitaxel  135 mg/m2 + Bevacizumab  15 mg/kg + Keytruda  q21d     10/29/2023 Genetic Testing   Negative genetic testing on the CancerNext+RNAinsight.  The report date is 10/29/2023.  The [facility] CancerNext+RNAinsight Panel includes sequencing, rearrangement analysis, and RNA analysis for the following 39 genes: APC, ATM, BAP1, BARD1, BMPR1A, BRCA1, BRCA2, BRIP1, CDH1, CDKN2A, CHEK2, FH, FLCN, MET, MLH1, MSH2, MSH6, MUTYH, NF1, NTHL1, PALB2, PMS2, PTEN, RAD51C, RAD51D, SMAD4, STK11, TP53, TSC1, TSC2, and VHL (sequencing and deletion/duplication); AXIN2, HOXB13, MBD4, MSH3, POLD1 and POLE (sequencing only); EPCAM and GREM1 (deletion/duplication only).   11/12/2023 -  Chemotherapy   Patient is on Treatment Plan : CERVICAL Tisotumab vedotin -tftv (Tivdak ) q21d     Uterine cancer (HCC)  10/15/2023 Initial Diagnosis   Uterine cancer (HCC)   10/29/2023 Genetic Testing   Negative genetic testing on the CancerNext+RNAinsight.  The report date is 10/29/2023.  The [facility] CancerNext+RNAinsight Panel includes sequencing, rearrangement analysis, and RNA analysis for the following 39 genes: APC, ATM, BAP1, BARD1, BMPR1A, BRCA1, BRCA2, BRIP1, CDH1, CDKN2A, CHEK2, FH, FLCN, MET, MLH1, MSH2, MSH6, MUTYH, NF1, NTHL1, PALB2, PMS2, PTEN, RAD51C, RAD51D, SMAD4, STK11, TP53, TSC1, TSC2, and VHL (sequencing and  deletion/duplication); AXIN2, HOXB13, MBD4, MSH3, POLD1 and POLE (sequencing only); EPCAM and GREM1 (deletion/duplication only).      INTERVAL HISTORY:   Kristin Carson is a 53 y.o. female presenting to clinic today for follow up of metastatic cervical squamous cell carcinoma. She was last seen by me on 12/24/23.  Today, she states that she is doing well overall. Her appetite level is at ***%. Her energy level is at ***%.   PAST MEDICAL HISTORY:   Past Medical History: Past Medical History:  Diagnosis Date   Anemia    Asthma    Bronchitis    Cancer (HCC)    Cervical   CHF (congestive heart failure) (HCC)    COPD (chronic obstructive pulmonary disease) (HCC)    Depression    GERD (gastroesophageal reflux disease)    Headache    Hypertension    Port-A-Cath in place 09/25/2022    Surgical History: Past Surgical History:  Procedure Laterality Date   BREAST SURGERY     CESAREAN SECTION     IR IMAGING GUIDED PORT INSERTION  09/30/2022   LEG SURGERY      Social History: Social History   Socioeconomic History   Marital status: Married    Spouse name: Not on file   Number of children: Not  on file   Years of education: Not on file   Highest education level: Not on file  Occupational History   Not on file  Tobacco Use   Smoking status: Every Day    Current packs/day: 0.25    Average packs/day: 0.3 packs/day for 23.0 years (5.8 ttl pk-yrs)    Types: Cigarettes   Smokeless tobacco: Never  Vaping Use   Vaping status: Never Used  Substance and Sexual Activity   Alcohol use: Yes    Comment: occ   Drug use: Yes    Types: Marijuana   Sexual activity: Yes    Birth control/protection: None  Other Topics Concern   Not on file  Social History Narrative   Not on file   Social Drivers of Health   Financial Resource Strain: Low Risk  (09/09/2022)   Overall Financial Resource Strain (CARDIA)    Difficulty of Paying Living Expenses: Not hard at all  Food Insecurity: No Food  Insecurity (10/26/2023)   Hunger Vital Sign    Worried About Running Out of Food in the Last Year: Never true    Ran Out of Food in the Last Year: Never true  Transportation Needs: No Transportation Needs (10/26/2023)   PRAPARE - Administrator, Civil Service (Medical): No    Lack of Transportation (Non-Medical): No  Physical Activity: Insufficiently Active (09/09/2022)   Exercise Vital Sign    Days of Exercise per Week: 2 days    Minutes of Exercise per Session: 20 min  Stress: No Stress Concern Present (09/09/2022)   [employer] of Occupational Health - Occupational Stress Questionnaire    Feeling of Stress : Only a little  Social Connections: Socially Isolated (09/09/2022)   Social Connection and Isolation Panel [NHANES]    Frequency of Communication with Friends and Family: Three times a week    Frequency of Social Gatherings with Friends and Family: More than three times a week    Attends Religious Services: Never    Database administrator or Organizations: No    Attends Banker Meetings: Never    Marital Status: Separated  Intimate Partner Violence: Not At Risk (10/26/2023)   Humiliation, Afraid, Rape, and Kick questionnaire    Fear of Current or Ex-Partner: No    Emotionally Abused: No    Physically Abused: No    Sexually Abused: No    Family History: Family History  Problem Relation Age of Onset   Dermatomyositis Mother    Hypertension Mother    Cancer Mother        lung   Cancer Father        lung   Heart disease Father    Lung cancer Paternal Uncle    Lung cancer Maternal Uncle    Colon cancer Neg Hx    Breast cancer Neg Hx    Ovarian cancer Neg Hx    Endometrial cancer Neg Hx    Pancreatic cancer Neg Hx    Prostate cancer Neg Hx     Current Medications:  Current Outpatient Medications:    acetaminophen  (TYLENOL ) 500 MG tablet, Take 2 tablets (1,000 mg total) by mouth every 8 (eight) hours as needed for mild pain (pain score 1-3),  fever or headache., Disp: , Rfl:    amLODipine  (NORVASC ) 10 MG tablet, Take 1 tablet (10 mg total) by mouth daily., Disp: 90 tablet, Rfl: 3   brimonidine  (ALPHAGAN  P) 0.1 % SOLN, Place 3 drops into both eyes immediately prior  to start of Tivdak  infusion, Disp: 5 mL, Rfl: 11   budesonide -formoterol  (SYMBICORT ) 160-4.5 MCG/ACT inhaler, Inhale 2 puffs into the lungs in the morning and at bedtime., Disp: 1 each, Rfl: 3   Carboxymethylcellulose Sod PF 0.5 % SOLN, Place 2 drops into both eyes 4 (four) times daily. Continue for 30 days after last dose of Tivdak , Disp: 30 each, Rfl: 11   cetirizine  (ZYRTEC  ALLERGY) 10 MG tablet, Take 1 tablet (10 mg total) by mouth daily., Disp: 30 tablet, Rfl: 1   dexamethasone  (DECADRON ) 0.1 % ophthalmic suspension, Instill 1 drop into both eyes immediately prior to Tivdak  and in the morning, in the evening and before bedtime for 72 hours after infusion, Disp: 5 mL, Rfl: 11   levalbuterol  (XOPENEX  HFA) 45 MCG/ACT inhaler, Inhale 2 puffs into the lungs every 8 (eight) hours as needed for wheezing or shortness of breath., Disp: 1 each, Rfl: 2   lidocaine -prilocaine  (EMLA ) cream, Apply a quarter-sized amount to port a cath site and cover with plastic wrap 1 hour prior to infusion appointments, Disp: 30 g, Rfl: 3   magnesium  oxide (MAG-OX) 400 (240 Mg) MG tablet, Take 1 tablet (400 mg total) by mouth 3 (three) times daily., Disp: 90 tablet, Rfl: 3   megestrol  (MEGACE ) 400 MG/10ML suspension, Take 10 mLs (400 mg total) by mouth 2 (two) times daily., Disp: 480 mL, Rfl: 3   methocarbamol  (ROBAXIN ) 500 MG tablet, Take 1 tablet (500 mg total) by mouth 2 (two) times daily as needed for muscle spasms., Disp: 20 tablet, Rfl: 0   methylPREDNISolone  (MEDROL  DOSEPAK) 4 MG TBPK tablet, Taper over 6 days, Disp: 21 tablet, Rfl: 0   oxyCODONE  (OXY IR/ROXICODONE ) 5 MG immediate release tablet, Take 1 tablet (5 mg total) by mouth every 6 (six) hours as needed for severe pain (pain score 7-10).,  Disp: 120 tablet, Rfl: 0   pantoprazole  (PROTONIX ) 40 MG tablet, Take 1 tablet (40 mg total) by mouth daily., Disp: 90 tablet, Rfl: 1   predniSONE  (DELTASONE ) 20 MG tablet, Take 3 tablets by mouth daily x 1 day; then 2 tablet by mouth daily x 2 days; then 1 tablet by mouth daily x 3 days; then half tablet by mouth daily x 3 days and stop prednisone ., Disp: 12 tablet, Rfl: 0   pregabalin  (LYRICA ) 200 MG capsule, Take 1 capsule (200 mg total) by mouth 3 (three) times daily., Disp: 180 capsule, Rfl: 0   Allergies: Allergies  Allergen Reactions   Carrot [Daucus Carota] Hives   Lisinopril -Hydrochlorothiazide      Oral swelling   Ibuprofen  Other (See Comments)    Oral swelling   Other Itching and Other (See Comments)    Hair dye, blisters, pus-filled, soreness   Tramadol  Hives   Erythromycin Hives    REVIEW OF SYSTEMS:   Review of Systems  Constitutional:  Negative for chills, fatigue and fever.  HENT:   Negative for lump/mass, mouth sores, nosebleeds, sore throat and trouble swallowing.   Eyes:  Negative for eye problems.  Respiratory:  Negative for cough and shortness of breath.   Cardiovascular:  Negative for chest pain, leg swelling and palpitations.  Gastrointestinal:  Negative for abdominal pain, constipation, diarrhea, nausea and vomiting.  Genitourinary:  Negative for bladder incontinence, difficulty urinating, dysuria, frequency, hematuria and nocturia.   Musculoskeletal:  Negative for arthralgias, back pain, flank pain, myalgias and neck pain.  Skin:  Negative for itching and rash.  Neurological:  Negative for dizziness, headaches and numbness.  Hematological:  Does not bruise/bleed easily.  Psychiatric/Behavioral:  Negative for depression, sleep disturbance and suicidal ideas. The patient is not nervous/anxious.   All other systems reviewed and are negative.    VITALS:   Last menstrual period 08/15/2022.  Wt Readings from Last 3 Encounters:  12/24/23 162 lb 4.1 oz (73.6  kg)  12/19/23 166 lb (75.3 kg)  12/03/23 168 lb 14 oz (76.6 kg)    There is no height or weight on file to calculate BMI.  Performance status (ECOG): 1 - Symptomatic but completely ambulatory  PHYSICAL EXAM:   Physical Exam Vitals and nursing note reviewed. Exam conducted with a chaperone present.  Constitutional:      Appearance: Normal appearance.  Cardiovascular:     Rate and Rhythm: Normal rate and regular rhythm.     Pulses: Normal pulses.     Heart sounds: Normal heart sounds.  Pulmonary:     Effort: Pulmonary effort is normal.     Breath sounds: Normal breath sounds.  Abdominal:     Palpations: Abdomen is soft. There is no hepatomegaly, splenomegaly or mass.     Tenderness: There is no abdominal tenderness.  Musculoskeletal:     Right lower leg: No edema.     Left lower leg: No edema.  Lymphadenopathy:     Cervical: No cervical adenopathy.     Right cervical: No superficial, deep or posterior cervical adenopathy.    Left cervical: No superficial, deep or posterior cervical adenopathy.     Upper Body:     Right upper body: No supraclavicular or axillary adenopathy.     Left upper body: No supraclavicular or axillary adenopathy.  Neurological:     General: No focal deficit present.     Mental Status: She is alert and oriented to person, place, and time.  Psychiatric:        Mood and Affect: Mood normal.        Behavior: Behavior normal.     LABS:      Latest Ref Rng & Units 12/24/2023   10:19 AM 12/19/2023    5:30 PM 12/03/2023    8:01 AM  CBC  WBC 4.0 - 10.5 K/uL 6.7  11.2  6.2   Hemoglobin 12.0 - 15.0 g/dL 16.1  09.6  9.0   Hematocrit 36.0 - 46.0 % 33.0  31.5  28.3   Platelets 150 - 400 K/uL 286  311  229       Latest Ref Rng & Units 12/24/2023   10:19 AM 12/19/2023    5:30 PM 12/03/2023    8:01 AM  CMP  Glucose 70 - 99 mg/dL 94  82  84   BUN 6 - 20 mg/dL 8  8  9    Creatinine 0.44 - 1.00 mg/dL 0.45  4.09  8.11   Sodium 135 - 145 mmol/L 136  135  134    Potassium 3.5 - 5.1 mmol/L 3.4  3.8  3.6   Chloride 98 - 111 mmol/L 102  99  100   CO2 22 - 32 mmol/L 23  27  23    Calcium  8.9 - 10.3 mg/dL 9.2  9.0  8.7   Total Protein 6.5 - 8.1 g/dL 6.6   6.9   Total Bilirubin 0.0 - 1.2 mg/dL 0.6   0.6   Alkaline Phos 38 - 126 U/L 63   56   AST 15 - 41 U/L 21   20   ALT 0 - 44 U/L 8   11  No results found for: "CEA1", "CEA" / No results found for: "CEA1", "CEA" No results found for: "PSA1" No results found for: "WUJ811" No results found for: "CAN125"  No results found for: "TOTALPROTELP", "ALBUMINELP", "A1GS", "A2GS", "BETS", "BETA2SER", "GAMS", "MSPIKE", "SPEI" Lab Results  Component Value Date   TIBC 318 11/12/2023   TIBC 412 09/22/2022   FERRITIN 400 (H) 11/12/2023   FERRITIN 35 09/22/2022   IRONPCTSAT 20 11/12/2023   IRONPCTSAT 7 (L) 09/22/2022   No results found for: "LDH"   STUDIES:   DG Lumbar Spine Complete Result Date: 12/19/2023 CLINICAL DATA:  Trauma. History of stage IV cancer and generalized body aches. EXAM: LUMBAR SPINE - COMPLETE 4+ VIEW COMPARISON:  02/02/2020. FINDINGS: There is no evidence of acute lumbar spine fracture. There is mild retrolisthesis at L5-S1. Intervertebral disc spaces are maintained. Atherosclerotic calcification of the aorta is noted. IMPRESSION: No acute fracture in the lumbar spine. Electronically Signed   By: Wyvonnia Heimlich M.D.   On: 12/19/2023 19:06

## 2024-01-15 DIAGNOSIS — J449 Chronic obstructive pulmonary disease, unspecified: Secondary | ICD-10-CM | POA: Diagnosis not present

## 2024-01-18 DIAGNOSIS — J449 Chronic obstructive pulmonary disease, unspecified: Secondary | ICD-10-CM | POA: Diagnosis not present

## 2024-01-19 DIAGNOSIS — J449 Chronic obstructive pulmonary disease, unspecified: Secondary | ICD-10-CM | POA: Diagnosis not present

## 2024-01-20 DIAGNOSIS — J449 Chronic obstructive pulmonary disease, unspecified: Secondary | ICD-10-CM | POA: Diagnosis not present

## 2024-01-21 DIAGNOSIS — J449 Chronic obstructive pulmonary disease, unspecified: Secondary | ICD-10-CM | POA: Diagnosis not present

## 2024-01-22 DIAGNOSIS — J449 Chronic obstructive pulmonary disease, unspecified: Secondary | ICD-10-CM | POA: Diagnosis not present

## 2024-01-23 ENCOUNTER — Emergency Department (HOSPITAL_COMMUNITY)

## 2024-01-23 ENCOUNTER — Inpatient Hospital Stay (HOSPITAL_COMMUNITY)
Admission: EM | Admit: 2024-01-23 | Discharge: 2024-01-27 | DRG: 436 | Disposition: A | Discharge status: Home / Self Care | Attending: Internal Medicine | Admitting: Internal Medicine

## 2024-01-23 ENCOUNTER — Encounter (HOSPITAL_COMMUNITY): Payer: Self-pay

## 2024-01-23 DIAGNOSIS — C541 Malignant neoplasm of endometrium: Secondary | ICD-10-CM | POA: Diagnosis not present

## 2024-01-23 DIAGNOSIS — I110 Hypertensive heart disease with heart failure: Secondary | ICD-10-CM | POA: Diagnosis present

## 2024-01-23 DIAGNOSIS — C787 Secondary malignant neoplasm of liver and intrahepatic bile duct: Secondary | ICD-10-CM | POA: Diagnosis not present

## 2024-01-23 DIAGNOSIS — Z1152 Encounter for screening for COVID-19: Secondary | ICD-10-CM

## 2024-01-23 DIAGNOSIS — C539 Malignant neoplasm of cervix uteri, unspecified: Secondary | ICD-10-CM | POA: Diagnosis present

## 2024-01-23 DIAGNOSIS — E860 Dehydration: Secondary | ICD-10-CM | POA: Diagnosis present

## 2024-01-23 DIAGNOSIS — Z9221 Personal history of antineoplastic chemotherapy: Secondary | ICD-10-CM

## 2024-01-23 DIAGNOSIS — E876 Hypokalemia: Secondary | ICD-10-CM | POA: Diagnosis present

## 2024-01-23 DIAGNOSIS — Z8541 Personal history of malignant neoplasm of cervix uteri: Secondary | ICD-10-CM

## 2024-01-23 DIAGNOSIS — Z886 Allergy status to analgesic agent status: Secondary | ICD-10-CM

## 2024-01-23 DIAGNOSIS — E871 Hypo-osmolality and hyponatremia: Secondary | ICD-10-CM | POA: Diagnosis present

## 2024-01-23 DIAGNOSIS — D689 Coagulation defect, unspecified: Secondary | ICD-10-CM | POA: Diagnosis present

## 2024-01-23 DIAGNOSIS — Z7951 Long term (current) use of inhaled steroids: Secondary | ICD-10-CM

## 2024-01-23 DIAGNOSIS — Z881 Allergy status to other antibiotic agents status: Secondary | ICD-10-CM

## 2024-01-23 DIAGNOSIS — C786 Secondary malignant neoplasm of retroperitoneum and peritoneum: Secondary | ICD-10-CM | POA: Diagnosis present

## 2024-01-23 DIAGNOSIS — R748 Abnormal levels of other serum enzymes: Secondary | ICD-10-CM

## 2024-01-23 DIAGNOSIS — Z801 Family history of malignant neoplasm of trachea, bronchus and lung: Secondary | ICD-10-CM

## 2024-01-23 DIAGNOSIS — N858 Other specified noninflammatory disorders of uterus: Secondary | ICD-10-CM | POA: Diagnosis not present

## 2024-01-23 DIAGNOSIS — D696 Thrombocytopenia, unspecified: Secondary | ICD-10-CM | POA: Diagnosis present

## 2024-01-23 DIAGNOSIS — B179 Acute viral hepatitis, unspecified: Secondary | ICD-10-CM | POA: Diagnosis present

## 2024-01-23 DIAGNOSIS — Z885 Allergy status to narcotic agent status: Secondary | ICD-10-CM

## 2024-01-23 DIAGNOSIS — T451X5A Adverse effect of antineoplastic and immunosuppressive drugs, initial encounter: Secondary | ICD-10-CM | POA: Diagnosis present

## 2024-01-23 DIAGNOSIS — Z888 Allergy status to other drugs, medicaments and biological substances status: Secondary | ICD-10-CM

## 2024-01-23 DIAGNOSIS — J4489 Other specified chronic obstructive pulmonary disease: Secondary | ICD-10-CM | POA: Diagnosis present

## 2024-01-23 DIAGNOSIS — F32A Depression, unspecified: Secondary | ICD-10-CM | POA: Diagnosis present

## 2024-01-23 DIAGNOSIS — G8929 Other chronic pain: Secondary | ICD-10-CM | POA: Diagnosis present

## 2024-01-23 DIAGNOSIS — Z8249 Family history of ischemic heart disease and other diseases of the circulatory system: Secondary | ICD-10-CM

## 2024-01-23 DIAGNOSIS — D539 Nutritional anemia, unspecified: Secondary | ICD-10-CM | POA: Diagnosis present

## 2024-01-23 DIAGNOSIS — H189 Unspecified disorder of cornea: Secondary | ICD-10-CM | POA: Diagnosis present

## 2024-01-23 DIAGNOSIS — R112 Nausea with vomiting, unspecified: Secondary | ICD-10-CM | POA: Diagnosis present

## 2024-01-23 DIAGNOSIS — Z79899 Other long term (current) drug therapy: Secondary | ICD-10-CM

## 2024-01-23 DIAGNOSIS — K719 Toxic liver disease, unspecified: Secondary | ICD-10-CM | POA: Diagnosis present

## 2024-01-23 DIAGNOSIS — C7800 Secondary malignant neoplasm of unspecified lung: Secondary | ICD-10-CM | POA: Diagnosis present

## 2024-01-23 DIAGNOSIS — F17210 Nicotine dependence, cigarettes, uncomplicated: Secondary | ICD-10-CM | POA: Diagnosis present

## 2024-01-23 DIAGNOSIS — Z716 Tobacco abuse counseling: Secondary | ICD-10-CM

## 2024-01-23 DIAGNOSIS — Z7189 Other specified counseling: Secondary | ICD-10-CM

## 2024-01-23 DIAGNOSIS — Z604 Social exclusion and rejection: Secondary | ICD-10-CM | POA: Diagnosis present

## 2024-01-23 DIAGNOSIS — I119 Hypertensive heart disease without heart failure: Secondary | ICD-10-CM | POA: Diagnosis present

## 2024-01-23 DIAGNOSIS — K7689 Other specified diseases of liver: Secondary | ICD-10-CM | POA: Diagnosis present

## 2024-01-23 DIAGNOSIS — K219 Gastro-esophageal reflux disease without esophagitis: Secondary | ICD-10-CM | POA: Diagnosis present

## 2024-01-23 DIAGNOSIS — Z7952 Long term (current) use of systemic steroids: Secondary | ICD-10-CM

## 2024-01-23 DIAGNOSIS — R7401 Elevation of levels of liver transaminase levels: Secondary | ICD-10-CM | POA: Diagnosis present

## 2024-01-23 LAB — COMPREHENSIVE METABOLIC PANEL WITH GFR
ALT: 855 U/L — ABNORMAL HIGH (ref 0–44)
AST: 2520 U/L — ABNORMAL HIGH (ref 15–41)
Albumin: 3.1 g/dL — ABNORMAL LOW (ref 3.5–5.0)
Alkaline Phosphatase: 89 U/L (ref 38–126)
Anion gap: 11 (ref 5–15)
BUN: 14 mg/dL (ref 6–20)
CO2: 19 mmol/L — ABNORMAL LOW (ref 22–32)
Calcium: 8.9 mg/dL (ref 8.9–10.3)
Chloride: 103 mmol/L (ref 98–111)
Creatinine, Ser: 0.95 mg/dL (ref 0.44–1.00)
GFR, Estimated: >60 mL/min (ref >60)
Glucose, Bld: 78 mg/dL (ref 70–99)
Potassium: 3.6 mmol/L (ref 3.5–5.1)
Sodium: 133 mmol/L — ABNORMAL LOW (ref 135–145)
Total Bilirubin: 1.9 mg/dL — ABNORMAL HIGH (ref 0.0–1.2)
Total Protein: 6.8 g/dL (ref 6.5–8.1)

## 2024-01-23 LAB — CBC WITH DIFFERENTIAL/PLATELET
Abs Immature Granulocytes: 0.04 10*3/uL (ref 0.00–0.07)
Basophils Absolute: 0 10*3/uL (ref 0.0–0.1)
Basophils Relative: 0 %
Eosinophils Absolute: 0.1 10*3/uL (ref 0.0–0.5)
Eosinophils Relative: 1 %
HCT: 35.7 % — ABNORMAL LOW (ref 36.0–46.0)
Hemoglobin: 12.2 g/dL (ref 12.0–15.0)
Immature Granulocytes: 0 %
Lymphocytes Relative: 13 %
Lymphs Abs: 1.3 10*3/uL (ref 0.7–4.0)
MCH: 35.2 pg — ABNORMAL HIGH (ref 26.0–34.0)
MCHC: 34.2 g/dL (ref 30.0–36.0)
MCV: 102.9 fL — ABNORMAL HIGH (ref 80.0–100.0)
Monocytes Absolute: 0.5 10*3/uL (ref 0.1–1.0)
Monocytes Relative: 5 %
Neutro Abs: 7.9 10*3/uL — ABNORMAL HIGH (ref 1.7–7.7)
Neutrophils Relative %: 81 %
Platelets: 169 10*3/uL (ref 150–400)
RBC: 3.47 MIL/uL — ABNORMAL LOW (ref 3.87–5.11)
RDW: 16.9 % — ABNORMAL HIGH (ref 11.5–15.5)
WBC: 9.8 10*3/uL (ref 4.0–10.5)
nRBC: 0 % (ref 0.0–0.2)

## 2024-01-23 LAB — LIPASE, BLOOD: Lipase: 21 U/L (ref 11–51)

## 2024-01-23 MED ORDER — ONDANSETRON HCL 4 MG/2ML IJ SOLN
4.0000 mg | Freq: Once | INTRAMUSCULAR | Status: AC
  Administered 2024-01-23: 4 mg via INTRAVENOUS
  Filled 2024-01-23: qty 2

## 2024-01-23 MED ORDER — IOHEXOL 350 MG/ML SOLN
80.0000 mL | Freq: Once | INTRAVENOUS | Status: AC | PRN
  Administered 2024-01-23: 80 mL via INTRAVENOUS

## 2024-01-23 MED ORDER — SODIUM CHLORIDE 0.9 % IV BOLUS
1000.0000 mL | Freq: Once | INTRAVENOUS | Status: AC
  Administered 2024-01-23: 1000 mL via INTRAVENOUS

## 2024-01-23 MED ORDER — PANTOPRAZOLE SODIUM 40 MG IV SOLR
40.0000 mg | Freq: Once | INTRAVENOUS | Status: AC
  Administered 2024-01-23: 40 mg via INTRAVENOUS
  Filled 2024-01-23: qty 10

## 2024-01-23 MED ORDER — HYDROMORPHONE HCL 1 MG/ML IJ SOLN
0.5000 mg | Freq: Once | INTRAMUSCULAR | Status: AC
  Administered 2024-01-23: 0.5 mg via INTRAVENOUS
  Filled 2024-01-23: qty 0.5

## 2024-01-23 NOTE — ED Triage Notes (Signed)
 Pt comes in for n/v/d since Monday. Pt is unable to keep any thing down. Pt has not take anything for s/s. Pt is A&ox4. Pt is also having right pain. Pt states there is changes in her vision.

## 2024-01-23 NOTE — ED Provider Notes (Signed)
 Care assumed at shift change. Patient with metastatic endometrial cancer here with increased nausea. Found to have elevated LFTs. Awaiting CT.  Physical Exam  BP 110/80   Pulse (!) 128   Temp 97.6 F (36.4 C) (Temporal)   Ht 5\' 5"  (1.651 m)   Wt 72.6 kg   LMP 08/15/2022 (Approximate)   SpO2 100%   BMI 26.63 kg/m   Physical Exam  Procedures  Procedures  ED Course / MDM   Clinical Course as of 01/24/24 0236  Sat Jan 23, 2024  2351 I personally viewed the images from radiology studies and agree with radiologist interpretation: CT with signs of progressive endometrial cancer. No signs of biliary obstruction. Patient reports nausea is improved, but give the increase in LFTs, will discuss admission with hospitalist.  [CS]  Sun Jan 24, 2024  0021 Spoke with Dr. Amy Kansky, Hospitalist, who will evaluate for admission.  [CS]    Clinical Course User Index [CS] Charmayne Cooper, MD   Medical Decision Making Amount and/or Complexity of Data Reviewed Labs: ordered. Radiology: ordered.  Risk Prescription drug management. Decision regarding hospitalization.          Charmayne Cooper, MD 01/24/24 [phone]

## 2024-01-23 NOTE — ED Provider Notes (Signed)
 [place] EMERGENCY DEPARTMENT AT [facility] Provider Note   CSN: [id number] Arrival date & time: 01/23/24  1950     History {Add pertinent medical, surgical, social history, OB history to HPI:1} Chief Complaint  Patient presents with   Nausea   Emesis    Kristin Carson is a 53 y.o. female.  Patient has a history of metastatic cervical cancer.  She is getting chemotherapy but she missed her last chemotherapy on May 15 because of transportation.  Patient states that she has been having abdominal pain and vomiting since Monday and has not been able to keep anything down.  Patient takes steroid eyedrops that was started on May 14.  According to the patient she sees the eye doctor the day before she starts her chemotherapy.  Her right eye has been red and the ophthalmologist has started the prednisone  drops.  She is supposed to follow back up prior to the next chemotherapy treatment  The history is provided by the patient and medical records. No language interpreter was used.  Emesis Severity:  Moderate Timing:  Constant Quality:  Bilious material Able to tolerate:  Liquids Progression:  Worsening Chronicity:  New Recent urination:  Normal Context: not post-tussive   Worsened by:  Nothing Ineffective treatments:  None tried Associated symptoms: no abdominal pain, no cough, no diarrhea and no headaches        Home Medications Prior to Admission medications   Medication Sig Start Date End Date Taking? Authorizing Provider  acetaminophen  (TYLENOL ) 500 MG tablet Take 2 tablets (1,000 mg total) by mouth every 8 (eight) hours as needed for mild pain (pain score 1-3), fever or headache. 10/28/23   Justina Oman, MD  amLODipine  (NORVASC ) 10 MG tablet Take 1 tablet (10 mg total) by mouth daily. 11/23/23   Tobi Fortes, MD  brimonidine  (ALPHAGAN  P) 0.1 % SOLN Place 3 drops into both eyes immediately prior to start of [place]  infusion 11/03/23   Paulett Boros, MD   budesonide -formoterol  (SYMBICORT ) 160-4.5 MCG/ACT inhaler Inhale 2 puffs into the lungs in the morning and at bedtime. 10/28/23   Justina Oman, MD  Carboxymethylcellulose Sod PF 0.5 % SOLN Place 2 drops into both eyes 4 (four) times daily. Continue for 30 days after last dose of [place]  11/03/23   Paulett Boros, MD  cetirizine  (ZYRTEC  ALLERGY) 10 MG tablet Take 1 tablet (10 mg total) by mouth daily. 10/28/23   Justina Oman, MD  dexamethasone  (DECADRON ) 0.1 % ophthalmic suspension Instill 1 drop into both eyes immediately prior to [place]  and in the morning, in the evening and before bedtime for 72 hours after infusion 11/03/23   Paulett Boros, MD  levalbuterol  (XOPENEX  HFA) 45 MCG/ACT inhaler Inhale 2 puffs into the lungs every 8 (eight) hours as needed for wheezing or shortness of breath. 10/28/23 10/27/24  Justina Oman, MD  lidocaine -prilocaine  (EMLA ) cream Apply a quarter-sized amount to port a cath site and cover with plastic wrap 1 hour prior to infusion appointments 12/03/23   Paulett Boros, MD  magnesium  oxide (MAG-OX) 400 (240 Mg) MG tablet Take 1 tablet (400 mg total) by mouth 3 (three) times daily. 12/24/23   Paulett Boros, MD  megestrol  (MEGACE ) 400 MG/10ML suspension Take 10 mLs (400 mg total) by mouth 2 (two) times daily. 06/09/23   Paulett Boros, MD  methocarbamol  (ROBAXIN ) 500 MG tablet Take 1 tablet (500 mg total) by mouth 2 (two) times daily as needed for muscle spasms. 12/19/23   Early Glisson, MD  methylPREDNISolone  (MEDROL  DOSEPAK) 4 MG TBPK tablet Taper over 6 days 12/19/23   Early Glisson, MD  oxyCODONE  (OXY IR/ROXICODONE ) 5 MG immediate release tablet Take 1 tablet (5 mg total) by mouth every 6 (six) hours as needed for severe pain (pain score 7-10). 12/24/23   Paulett Boros, MD  pantoprazole  (PROTONIX ) 40 MG tablet Take 1 tablet (40 mg total) by mouth daily. 05/26/23   Tobi Fortes, MD  predniSONE  (DELTASONE ) 20 MG tablet Take 3 tablets by mouth  daily x 1 day; then 2 tablet by mouth daily x 2 days; then 1 tablet by mouth daily x 3 days; then half tablet by mouth daily x 3 days and stop prednisone . 10/28/23   Justina Oman, MD  pregabalin  (LYRICA ) 200 MG capsule Take 1 capsule (200 mg total) by mouth 3 (three) times daily. 12/03/23   Paulett Boros, MD  famotidine  (PEPCID ) 20 MG tablet Take 1 tablet (20 mg total) by mouth 2 (two) times daily. 12/18/17 02/02/20  Knapp, Iva, MD  hydrochlorothiazide  (HYDRODIURIL ) 25 MG tablet Take 1 tablet (25 mg total) by mouth daily. 06/17/19 02/02/20  Knapp, Iva, MD      Allergies    Carrot [daucus carota], Lisinopril -hydrochlorothiazide , Ibuprofen , Other, Tramadol , and Erythromycin    Review of Systems   Review of Systems  Constitutional:  Negative for appetite change and fatigue.  HENT:  Negative for congestion, ear discharge and sinus pressure.   Eyes:  Negative for discharge.  Respiratory:  Negative for cough.   Cardiovascular:  Negative for chest pain.  Gastrointestinal:  Positive for vomiting. Negative for abdominal pain and diarrhea.  Genitourinary:  Negative for frequency and hematuria.  Musculoskeletal:  Negative for back pain.  Skin:  Negative for rash.  Neurological:  Negative for seizures and headaches.  Psychiatric/Behavioral:  Negative for hallucinations.     Physical Exam Updated Vital Signs BP 110/80   Pulse (!) 128   Temp 97.6 F (36.4 C) (Temporal)   Ht 5\' 5"  (1.651 m)   Wt 72.6 kg   LMP 08/15/2022 (Approximate)   SpO2 100%   BMI 26.63 kg/m  Physical Exam Vitals and nursing note reviewed.  Constitutional:      Appearance: She is well-developed.  HENT:     Head: Normocephalic.     Nose: Nose normal.  Eyes:     General: No scleral icterus.    Comments: Conjunctiva inflamed  Neck:     Thyroid : No thyromegaly.  Cardiovascular:     Rate and Rhythm: Normal rate and regular rhythm.     Heart sounds: No murmur heard.    No friction rub. No gallop.  Pulmonary:      Breath sounds: No stridor. No wheezing or rales.  Chest:     Chest wall: No tenderness.  Abdominal:     General: There is no distension.     Tenderness: There is abdominal tenderness. There is no rebound.  Musculoskeletal:        General: Normal range of motion.     Cervical back: Neck supple.  Lymphadenopathy:     Cervical: No cervical adenopathy.  Skin:    Findings: No erythema or rash.  Neurological:     Mental Status: She is oriented to person, place, and time.     Motor: No abnormal muscle tone.     Coordination: Coordination normal.  Psychiatric:        Behavior: Behavior normal.     ED Results / Procedures / Treatments   Labs (  all labs ordered are listed, but only abnormal results are displayed) Labs Reviewed  CBC WITH DIFFERENTIAL/PLATELET - Abnormal; Notable for the following components:      Result Value   RBC 3.47 (*)    HCT 35.7 (*)    MCV 102.9 (*)    MCH 35.2 (*)    RDW 16.9 (*)    Neutro Abs 7.9 (*)    All other components within normal limits  COMPREHENSIVE METABOLIC PANEL WITH GFR  LIPASE, BLOOD    EKG None  Radiology No results found.  Procedures Procedures  {Document cardiac monitor, telemetry assessment procedure when appropriate:1}  Medications Ordered in ED Medications  sodium chloride  0.9 % bolus 1,000 mL (1,000 mLs Intravenous New Bag/Given 01/23/24 2116)  ondansetron  (ZOFRAN ) injection 4 mg (4 mg Intravenous Given 01/23/24 2113)  HYDROmorphone  (DILAUDID ) injection 0.5 mg (0.5 mg Intravenous Given 01/23/24 2114)    ED Course/ Medical Decision Making/ A&P   {   Click here for ABCD2, HEART and other calculatorsREFRESH Note before signing :1}                              Medical Decision Making Amount and/or Complexity of Data Reviewed Labs: ordered.  Risk Prescription drug management.   Patient with persistent vomiting and abdominal pain.  {Document critical care time when appropriate:1} {Document review of labs and clinical  decision tools ie heart score, Chads2Vasc2 etc:1}  {Document your independent review of radiology images, and any outside records:1} {Document your discussion with family members, caretakers, and with consultants:1} {Document social determinants of health affecting pt's care:1} {Document your decision making why or why not admission, treatments were needed:1} Final Clinical Impression(s) / ED Diagnoses Final diagnoses:  None    Rx / DC Orders ED Discharge Orders     None

## 2024-01-24 ENCOUNTER — Inpatient Hospital Stay (HOSPITAL_COMMUNITY)

## 2024-01-24 DIAGNOSIS — N858 Other specified noninflammatory disorders of uterus: Secondary | ICD-10-CM | POA: Diagnosis not present

## 2024-01-24 DIAGNOSIS — G8929 Other chronic pain: Secondary | ICD-10-CM | POA: Diagnosis present

## 2024-01-24 DIAGNOSIS — C787 Secondary malignant neoplasm of liver and intrahepatic bile duct: Secondary | ICD-10-CM | POA: Diagnosis not present

## 2024-01-24 DIAGNOSIS — Z716 Tobacco abuse counseling: Secondary | ICD-10-CM

## 2024-01-24 DIAGNOSIS — D539 Nutritional anemia, unspecified: Secondary | ICD-10-CM | POA: Diagnosis not present

## 2024-01-24 DIAGNOSIS — C541 Malignant neoplasm of endometrium: Secondary | ICD-10-CM | POA: Diagnosis not present

## 2024-01-24 DIAGNOSIS — R112 Nausea with vomiting, unspecified: Secondary | ICD-10-CM | POA: Diagnosis present

## 2024-01-24 DIAGNOSIS — H189 Unspecified disorder of cornea: Secondary | ICD-10-CM | POA: Diagnosis not present

## 2024-01-24 DIAGNOSIS — E871 Hypo-osmolality and hyponatremia: Secondary | ICD-10-CM | POA: Diagnosis not present

## 2024-01-24 DIAGNOSIS — K7200 Acute and subacute hepatic failure without coma: Secondary | ICD-10-CM

## 2024-01-24 DIAGNOSIS — R748 Abnormal levels of other serum enzymes: Secondary | ICD-10-CM

## 2024-01-24 DIAGNOSIS — K828 Other specified diseases of gallbladder: Secondary | ICD-10-CM | POA: Diagnosis not present

## 2024-01-24 DIAGNOSIS — Z8542 Personal history of malignant neoplasm of other parts of uterus: Secondary | ICD-10-CM | POA: Diagnosis not present

## 2024-01-24 DIAGNOSIS — D689 Coagulation defect, unspecified: Secondary | ICD-10-CM | POA: Diagnosis not present

## 2024-01-24 DIAGNOSIS — E860 Dehydration: Secondary | ICD-10-CM | POA: Diagnosis present

## 2024-01-24 DIAGNOSIS — J449 Chronic obstructive pulmonary disease, unspecified: Secondary | ICD-10-CM | POA: Diagnosis not present

## 2024-01-24 DIAGNOSIS — K7689 Other specified diseases of liver: Secondary | ICD-10-CM | POA: Diagnosis not present

## 2024-01-24 DIAGNOSIS — C7800 Secondary malignant neoplasm of unspecified lung: Secondary | ICD-10-CM | POA: Diagnosis not present

## 2024-01-24 DIAGNOSIS — T451X5A Adverse effect of antineoplastic and immunosuppressive drugs, initial encounter: Secondary | ICD-10-CM | POA: Diagnosis present

## 2024-01-24 DIAGNOSIS — K719 Toxic liver disease, unspecified: Secondary | ICD-10-CM | POA: Diagnosis present

## 2024-01-24 DIAGNOSIS — J4489 Other specified chronic obstructive pulmonary disease: Secondary | ICD-10-CM | POA: Diagnosis not present

## 2024-01-24 DIAGNOSIS — R0609 Other forms of dyspnea: Secondary | ICD-10-CM | POA: Diagnosis not present

## 2024-01-24 DIAGNOSIS — F32A Depression, unspecified: Secondary | ICD-10-CM | POA: Diagnosis not present

## 2024-01-24 DIAGNOSIS — C539 Malignant neoplasm of cervix uteri, unspecified: Secondary | ICD-10-CM | POA: Diagnosis not present

## 2024-01-24 DIAGNOSIS — B179 Acute viral hepatitis, unspecified: Secondary | ICD-10-CM | POA: Diagnosis not present

## 2024-01-24 DIAGNOSIS — Z1152 Encounter for screening for COVID-19: Secondary | ICD-10-CM | POA: Diagnosis not present

## 2024-01-24 DIAGNOSIS — Z8541 Personal history of malignant neoplasm of cervix uteri: Secondary | ICD-10-CM | POA: Diagnosis not present

## 2024-01-24 DIAGNOSIS — Z8249 Family history of ischemic heart disease and other diseases of the circulatory system: Secondary | ICD-10-CM | POA: Diagnosis not present

## 2024-01-24 DIAGNOSIS — F1090 Alcohol use, unspecified, uncomplicated: Secondary | ICD-10-CM | POA: Diagnosis not present

## 2024-01-24 DIAGNOSIS — Z9221 Personal history of antineoplastic chemotherapy: Secondary | ICD-10-CM | POA: Diagnosis not present

## 2024-01-24 DIAGNOSIS — R7989 Other specified abnormal findings of blood chemistry: Secondary | ICD-10-CM | POA: Diagnosis not present

## 2024-01-24 DIAGNOSIS — E876 Hypokalemia: Secondary | ICD-10-CM | POA: Diagnosis present

## 2024-01-24 DIAGNOSIS — Z7951 Long term (current) use of inhaled steroids: Secondary | ICD-10-CM | POA: Diagnosis not present

## 2024-01-24 DIAGNOSIS — F17210 Nicotine dependence, cigarettes, uncomplicated: Secondary | ICD-10-CM | POA: Diagnosis present

## 2024-01-24 DIAGNOSIS — R7401 Elevation of levels of liver transaminase levels: Secondary | ICD-10-CM | POA: Diagnosis not present

## 2024-01-24 DIAGNOSIS — I110 Hypertensive heart disease with heart failure: Secondary | ICD-10-CM | POA: Diagnosis not present

## 2024-01-24 DIAGNOSIS — D696 Thrombocytopenia, unspecified: Secondary | ICD-10-CM | POA: Diagnosis present

## 2024-01-24 DIAGNOSIS — C786 Secondary malignant neoplasm of retroperitoneum and peritoneum: Secondary | ICD-10-CM | POA: Diagnosis not present

## 2024-01-24 LAB — HEPATIC FUNCTION PANEL
ALT: 1012 U/L — ABNORMAL HIGH (ref 0–44)
AST: 3233 U/L — ABNORMAL HIGH (ref 15–41)
Albumin: 2.9 g/dL — ABNORMAL LOW (ref 3.5–5.0)
Alkaline Phosphatase: 84 U/L (ref 38–126)
Bilirubin, Direct: 1 mg/dL — ABNORMAL HIGH (ref 0.0–0.2)
Indirect Bilirubin: 0.9 mg/dL (ref 0.3–0.9)
Total Bilirubin: 1.9 mg/dL — ABNORMAL HIGH (ref 0.0–1.2)
Total Protein: 6.4 g/dL — ABNORMAL LOW (ref 6.5–8.1)

## 2024-01-24 LAB — ECHOCARDIOGRAM COMPLETE
AR max vel: 2.65 cm2
AV Area VTI: 2.87 cm2
AV Area mean vel: 2.67 cm2
AV Mean grad: 5 mmHg
AV Peak grad: 9.2 mmHg
Ao pk vel: 1.52 m/s
Area-P 1/2: 3.12 cm2
Height: 65 in
P 1/2 time: 787 ms
S' Lateral: 3 cm
Weight: 2560 [oz_av]

## 2024-01-24 LAB — PHOSPHORUS: Phosphorus: 4 mg/dL (ref 2.5–4.6)

## 2024-01-24 LAB — BASIC METABOLIC PANEL WITH GFR
Anion gap: 8 (ref 5–15)
BUN: 14 mg/dL (ref 6–20)
CO2: 20 mmol/L — ABNORMAL LOW (ref 22–32)
Calcium: 8.3 mg/dL — ABNORMAL LOW (ref 8.9–10.3)
Chloride: 105 mmol/L (ref 98–111)
Creatinine, Ser: 0.8 mg/dL (ref 0.44–1.00)
GFR, Estimated: >60 mL/min (ref >60)
Glucose, Bld: 72 mg/dL (ref 70–99)
Potassium: 4.1 mmol/L (ref 3.5–5.1)
Sodium: 133 mmol/L — ABNORMAL LOW (ref 135–145)

## 2024-01-24 LAB — HEPATITIS B SURFACE ANTIGEN: Hepatitis B Surface Ag: NONREACTIVE

## 2024-01-24 LAB — ACETAMINOPHEN LEVEL: Acetaminophen (Tylenol), Serum: <10 ug/mL — ABNORMAL LOW (ref 10–30)

## 2024-01-24 LAB — TSH: TSH: 1.351 u[IU]/mL (ref 0.350–4.500)

## 2024-01-24 LAB — CBC
HCT: 35.9 % — ABNORMAL LOW (ref 36.0–46.0)
Hemoglobin: 12 g/dL (ref 12.0–15.0)
MCH: 34.5 pg — ABNORMAL HIGH (ref 26.0–34.0)
MCHC: 33.4 g/dL (ref 30.0–36.0)
MCV: 103.2 fL — ABNORMAL HIGH (ref 80.0–100.0)
Platelets: 156 10*3/uL (ref 150–400)
RBC: 3.48 MIL/uL — ABNORMAL LOW (ref 3.87–5.11)
RDW: 16.7 % — ABNORMAL HIGH (ref 11.5–15.5)
WBC: 8.4 10*3/uL (ref 4.0–10.5)
nRBC: 0 % (ref 0.0–0.2)

## 2024-01-24 LAB — PROTIME-INR
INR: 1.6 — ABNORMAL HIGH (ref 0.8–1.2)
Prothrombin Time: 18.9 s — ABNORMAL HIGH (ref 11.4–15.2)

## 2024-01-24 LAB — CK: Total CK: 30 U/L — ABNORMAL LOW (ref 38–234)

## 2024-01-24 LAB — MAGNESIUM: Magnesium: 1.4 mg/dL — ABNORMAL LOW (ref 1.7–2.4)

## 2024-01-24 LAB — ETHANOL: Alcohol, Ethyl (B): <15 mg/dL (ref <15)

## 2024-01-24 LAB — LACTIC ACID, PLASMA: Lactic Acid, Venous: 1.6 mmol/L (ref 0.5–1.9)

## 2024-01-24 LAB — AMMONIA: Ammonia: 21 umol/L (ref 9–35)

## 2024-01-24 MED ORDER — FLUTICASONE FUROATE-VILANTEROL 200-25 MCG/ACT IN AEPB
1.0000 | INHALATION_SPRAY | Freq: Every day | RESPIRATORY_TRACT | Status: DC
  Administered 2024-01-25 – 2024-01-27 (×3): 1 via RESPIRATORY_TRACT
  Filled 2024-01-24: qty 28

## 2024-01-24 MED ORDER — LACTATED RINGERS IV SOLN: INTRAVENOUS | Status: DC

## 2024-01-24 MED ORDER — ACETYLCYSTEINE 200 MG/ML IV SOLN
INTRAVENOUS | Status: AC
  Filled 2024-01-24: qty 90

## 2024-01-24 MED ORDER — DEXTROSE 5 % IV SOLN
6.2500 mg/kg/h | INTRAVENOUS | Status: DC
  Administered 2024-01-24 – 2024-01-26 (×3): 6.25 mg/kg/h via INTRAVENOUS
  Filled 2024-01-24 (×2): qty 90

## 2024-01-24 MED ORDER — METHOCARBAMOL 500 MG PO TABS: 500.0000 mg | ORAL_TABLET | Freq: Two times a day (BID) | ORAL | Status: DC | PRN

## 2024-01-24 MED ORDER — OXYCODONE HCL 5 MG PO TABS
2.5000 mg | ORAL_TABLET | Freq: Four times a day (QID) | ORAL | Status: DC | PRN
  Administered 2024-01-25: 2.5 mg via ORAL
  Filled 2024-01-24: qty 1

## 2024-01-24 MED ORDER — PREDNISOLONE ACETATE 1 % OP SUSP
1.0000 [drp] | Freq: Two times a day (BID) | OPHTHALMIC | Status: DC
  Administered 2024-01-24 – 2024-01-27 (×6): 1 [drp] via OPHTHALMIC
  Filled 2024-01-24: qty 1

## 2024-01-24 MED ORDER — MAGNESIUM SULFATE 2 GM/50ML IV SOLN
2.0000 g | Freq: Once | INTRAVENOUS | Status: AC
  Administered 2024-01-24: 2 g via INTRAVENOUS
  Filled 2024-01-24: qty 50

## 2024-01-24 MED ORDER — IPRATROPIUM-ALBUTEROL 0.5-2.5 (3) MG/3ML IN SOLN: 3.0000 mL | Freq: Four times a day (QID) | RESPIRATORY_TRACT | Status: DC | PRN

## 2024-01-24 MED ORDER — OXYCODONE HCL 5 MG PO TABS
5.0000 mg | ORAL_TABLET | Freq: Four times a day (QID) | ORAL | Status: DC | PRN
  Administered 2024-01-24 – 2024-01-27 (×8): 5 mg via ORAL
  Filled 2024-01-24 (×8): qty 1

## 2024-01-24 MED ORDER — POLYVINYL ALCOHOL 1.4 % OP SOLN
2.0000 [drp] | OPHTHALMIC | Status: DC | PRN
  Administered 2024-01-26 (×3): 2 [drp] via OPHTHALMIC
  Filled 2024-01-24: qty 15

## 2024-01-24 MED ORDER — NICOTINE 21 MG/24HR TD PT24: 21.0000 mg | MEDICATED_PATCH | Freq: Every day | TRANSDERMAL | Status: DC | PRN

## 2024-01-24 MED ORDER — PANTOPRAZOLE SODIUM 40 MG PO TBEC
40.0000 mg | DELAYED_RELEASE_TABLET | Freq: Every day | ORAL | Status: DC
  Administered 2024-01-24 – 2024-01-27 (×4): 40 mg via ORAL
  Filled 2024-01-24 (×4): qty 1

## 2024-01-24 MED ORDER — PROCHLORPERAZINE EDISYLATE 10 MG/2ML IJ SOLN
10.0000 mg | Freq: Four times a day (QID) | INTRAMUSCULAR | Status: DC | PRN
  Administered 2024-01-24: 10 mg via INTRAVENOUS
  Filled 2024-01-24: qty 2

## 2024-01-24 MED ORDER — ARTIFICIAL TEARS OPHTHALMIC OINT
TOPICAL_OINTMENT | Freq: Every day | OPHTHALMIC | Status: DC
  Administered 2024-01-26: 1 via OPHTHALMIC
  Filled 2024-01-24: qty 3.5

## 2024-01-24 MED ORDER — SODIUM CHLORIDE 0.9% FLUSH
3.0000 mL | Freq: Two times a day (BID) | INTRAVENOUS | Status: DC
  Administered 2024-01-24 – 2024-01-27 (×7): 3 mL via INTRAVENOUS

## 2024-01-24 MED ORDER — ONDANSETRON HCL 4 MG/2ML IJ SOLN
4.0000 mg | Freq: Four times a day (QID) | INTRAMUSCULAR | Status: DC | PRN
  Administered 2024-01-24 (×2): 4 mg via INTRAVENOUS
  Filled 2024-01-24 (×2): qty 2

## 2024-01-24 MED ORDER — PREGABALIN 75 MG PO CAPS
200.0000 mg | ORAL_CAPSULE | Freq: Three times a day (TID) | ORAL | Status: DC
  Administered 2024-01-24 – 2024-01-27 (×11): 200 mg via ORAL
  Filled 2024-01-24: qty 2
  Filled 2024-01-24: qty 1
  Filled 2024-01-24 (×3): qty 2
  Filled 2024-01-24 (×2): qty 1
  Filled 2024-01-24 (×3): qty 2
  Filled 2024-01-24: qty 1
  Filled 2024-01-24: qty 2

## 2024-01-24 MED ORDER — PREDNISOLONE ACETATE 1 % OP SUSP
1.0000 [drp] | Freq: Four times a day (QID) | OPHTHALMIC | Status: DC
  Administered 2024-01-24: 1 [drp] via OPHTHALMIC
  Filled 2024-01-24: qty 1

## 2024-01-24 MED ORDER — DEXTROSE 5 % IV SOLN
12.5000 mg/kg/h | INTRAVENOUS | Status: AC
  Administered 2024-01-24: 12.5 mg/kg/h via INTRAVENOUS
  Filled 2024-01-24: qty 30

## 2024-01-24 MED ORDER — POLYMYXIN B-TRIMETHOPRIM 10000-0.1 UNIT/ML-% OP SOLN
1.0000 [drp] | Freq: Four times a day (QID) | OPHTHALMIC | Status: DC
  Administered 2024-01-24 – 2024-01-27 (×15): 1 [drp] via OPHTHALMIC
  Filled 2024-01-24: qty 10

## 2024-01-24 MED ORDER — ACETYLCYSTEINE LOAD VIA INFUSION
150.0000 mg/kg | Freq: Once | INTRAVENOUS | Status: AC
  Administered 2024-01-24: 10890 mg via INTRAVENOUS
  Filled 2024-01-24: qty 357

## 2024-01-24 NOTE — ED Notes (Signed)
 Pt ambulated self to bathroom with out issue

## 2024-01-24 NOTE — H&P (Addendum)
 History and Physical    Kristin Carson VHQ:[id number] DOB: 10-Jul-1971 DOA: 01/23/2024  PCP: Tobi Fortes, MD   Patient coming from: Home   Chief Complaint:  Chief Complaint  Patient presents with   Nausea   Emesis    HPI:  Kristin Carson is a 53 y.o. 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  Reports onset Monday 5/19 but worsening especially over the past few days and now unable to tolerate any p.o.'s.  Emesis appears clear.  No hematemesis/coffee-ground emesis, hematochezia, melena.  No associated abdominal pain.  Mild diarrhea.  No fever, chills.  Does drink alcohol about 2 standard drinks per day but none in the past week.  Takes Tylenol  with maximum of 8 extra strength Tylenol  per day, denies other OTC medications containing Tylenol .    Otherwise reports that right eye has been bothering her more over the past few weeks associated decreased visual acuity.    Review of Systems:  ROS complete and negative except as marked above   Allergies  Allergen Reactions   Carrot [Daucus Carota] Hives   Lisinopril -Hydrochlorothiazide      Oral swelling   Ibuprofen  Other (See Comments)    Oral swelling   Other Itching and Other (See Comments)    Hair dye, blisters, pus-filled, soreness   Tramadol  Hives   Erythromycin Hives    Prior to Admission medications   Medication Sig Start Date End Date Taking? Authorizing Provider  acetaminophen  (TYLENOL ) 500 MG tablet Take 2 tablets (1,000 mg total) by mouth every 8 (eight) hours as needed for mild pain (pain score 1-3), fever or headache. 10/28/23   Justina Oman, MD  amLODipine  (NORVASC ) 10 MG tablet Take 1 tablet (10 mg total) by mouth daily. 11/23/23   Tobi Fortes, MD  brimonidine  (ALPHAGAN  P) 0.1 % SOLN Place 3 drops  into both eyes immediately prior to start of Tivdak  infusion 11/03/23   Paulett Boros, MD  budesonide -formoterol  (SYMBICORT ) 160-4.5 MCG/ACT inhaler Inhale 2 puffs into the lungs in the morning and at bedtime. 10/28/23   Justina Oman, MD  Carboxymethylcellulose Sod PF 0.5 % SOLN Place 2 drops into both eyes 4 (four) times daily. Continue for 30 days after last dose of Tivdak  11/03/23   Paulett Boros, MD  cetirizine  (ZYRTEC  ALLERGY) 10 MG tablet Take 1 tablet (10 mg total) by mouth daily. 10/28/23   Justina Oman, MD  dexamethasone  (DECADRON ) 0.1 % ophthalmic suspension Instill 1 drop into both eyes immediately prior to Tivdak  and in the morning, in the evening and before bedtime for 72 hours after infusion 11/03/23   Paulett Boros, MD  levalbuterol  (XOPENEX  HFA) 45 MCG/ACT inhaler Inhale 2 puffs into the lungs every 8 (eight) hours as needed for wheezing or shortness of breath. 10/28/23 10/27/24  Justina Oman, MD  lidocaine -prilocaine  (EMLA ) cream Apply a quarter-sized amount to port a cath site and cover with plastic wrap 1 hour prior to infusion appointments 12/03/23   Paulett Boros, MD  magnesium  oxide (MAG-OX) 400 (240 Mg) MG tablet Take 1 tablet (400 mg total) by mouth 3 (three) times daily. 12/24/23   Paulett Boros, MD  megestrol  (MEGACE ) 400 MG/10ML suspension Take 10 mLs (400 mg total) by mouth 2 (two) times daily. 06/09/23   Paulett Boros, MD  methocarbamol  (ROBAXIN ) 500 MG tablet Take 1 tablet (500 mg  total) by mouth 2 (two) times daily as needed for muscle spasms. 12/19/23   Early Glisson, MD  methylPREDNISolone  (MEDROL  DOSEPAK) 4 MG TBPK tablet Taper over 6 days 12/19/23   Early Glisson, MD  oxyCODONE  (OXY IR/ROXICODONE ) 5 MG immediate release tablet Take 1 tablet (5 mg total) by mouth every 6 (six) hours as needed for severe pain (pain score 7-10). 12/24/23   Paulett Boros, MD  pantoprazole  (PROTONIX ) 40 MG tablet Take 1 tablet (40 mg total) by mouth  daily. 05/26/23   Tobi Fortes, MD  predniSONE  (DELTASONE ) 20 MG tablet Take 3 tablets by mouth daily x 1 day; then 2 tablet by mouth daily x 2 days; then 1 tablet by mouth daily x 3 days; then half tablet by mouth daily x 3 days and stop prednisone . 10/28/23   Justina Oman, MD  pregabalin  (LYRICA ) 200 MG capsule Take 1 capsule (200 mg total) by mouth 3 (three) times daily. 12/03/23   Paulett Boros, MD  famotidine  (PEPCID ) 20 MG tablet Take 1 tablet (20 mg total) by mouth 2 (two) times daily. 12/18/17 02/02/20  Knapp, Iva, MD  hydrochlorothiazide  (HYDRODIURIL ) 25 MG tablet Take 1 tablet (25 mg total) by mouth daily. 06/17/19 02/02/20  Knapp, Iva, MD    Past Medical History:  Diagnosis Date   Anemia    Asthma    Bronchitis    Cancer (HCC)    Cervical   CHF (congestive heart failure) (HCC)    COPD (chronic obstructive pulmonary disease) (HCC)    Depression    GERD (gastroesophageal reflux disease)    Headache    Hypertension    Port-A-Cath in place 09/25/2022    Past Surgical History:  Procedure Laterality Date   BREAST SURGERY     CESAREAN SECTION     IR IMAGING GUIDED PORT INSERTION  09/30/2022   LEG SURGERY       reports that she has been smoking cigarettes. She has a 5.8 pack-year smoking history. She has never used smokeless tobacco. She reports current alcohol use. She reports current drug use. Drug: Marijuana.  Family History  Problem Relation Age of Onset   Dermatomyositis Mother    Hypertension Mother    Cancer Mother        lung   Cancer Father        lung   Heart disease Father    Lung cancer Paternal Uncle    Lung cancer Maternal Uncle    Colon cancer Neg Hx    Breast cancer Neg Hx    Ovarian cancer Neg Hx    Endometrial cancer Neg Hx    Pancreatic cancer Neg Hx    Prostate cancer Neg Hx      Physical Exam: Vitals:   01/23/24 1959 01/23/24 2000 01/23/24 2359 01/24/24 0004  BP: 110/80 110/80  (!) 143/88  Pulse: (!) 128   91  Resp:   15 15  Temp:  97.6 F (36.4 C)   (!) 97.5 F (36.4 C)  TempSrc: Temporal   Oral  SpO2: 100%   97%  Weight:  72.6 kg    Height:  5\' 5"  (1.651 m)      Gen: Awake, alert, NAD   HEENT: Right eye with conjunctival injection, two areas of corneal opacity approximately 2 mm each which did not overlie the pupil.  VA intact to count fingers, RN attempted VA testing but pt declined  CV: Regular, normal S1, S2, no murmurs  Resp: Normal WOB, coarse bilaterally Abd:  Flat, normoactive, nontender MSK: Symmetric, no edema  Skin: No rashes or lesions to exposed skin  Neuro: Alert and interactive  Psych: euthymic, appropriate    Data review:   Labs reviewed, notable for:   Bicarb 19, no anion gap.  Lactate 1.6 T. bili 1.9, AST 2520, ALT 855, alk phos 89 Tylenol  <10, etoh neg  INR 1.6 Ammonia 21  Micro:  Results for orders placed or performed during the hospital encounter of 10/25/23  Resp panel by RT-PCR (RSV, Flu A&B, Covid) Anterior Nasal Swab     Status: Abnormal   Collection Time: 10/25/23  9:56 PM   Specimen: Anterior Nasal Swab  Result Value Ref Range Status   SARS Coronavirus 2 by RT PCR NEGATIVE NEGATIVE Final    Comment: (NOTE) SARS-CoV-2 target nucleic acids are NOT DETECTED.  The SARS-CoV-2 RNA is generally detectable in upper respiratory specimens during the acute phase of infection. The lowest concentration of SARS-CoV-2 viral copies this assay can detect is 138 copies/mL. A negative result does not preclude SARS-Cov-2 infection and should not be used as the sole basis for treatment or other patient management decisions. A negative result may occur with  improper specimen collection/handling, submission of specimen other than nasopharyngeal swab, presence of viral mutation(s) within the areas targeted by this assay, and inadequate number of viral copies(<138 copies/mL). A negative result must be combined with clinical observations, patient history, and epidemiological information. The  expected result is Negative.  Fact Sheet for Patients:  BloggerCourse.com  Fact Sheet for Healthcare Providers:  SeriousBroker.it  This test is no t yet approved or cleared by the [place]  FDA and  has been authorized for detection and/or diagnosis of SARS-CoV-2 by FDA under an Emergency Use Authorization (EUA). This EUA will remain  in effect (meaning this test can be used) for the duration of the COVID-19 declaration under Section 564(b)(1) of the Act, 21 U.S.C.section 360bbb-3(b)(1), unless the authorization is terminated  or revoked sooner.       Influenza A by PCR POSITIVE (A) NEGATIVE Final   Influenza B by PCR NEGATIVE NEGATIVE Final    Comment: (NOTE) The Xpert Xpress SARS-CoV-2/FLU/RSV plus assay is intended as an aid in the diagnosis of influenza from Nasopharyngeal swab specimens and should not be used as a sole basis for treatment. Nasal washings and aspirates are unacceptable for Xpert Xpress SARS-CoV-2/FLU/RSV testing.  Fact Sheet for Patients: BloggerCourse.com  Fact Sheet for Healthcare Providers: SeriousBroker.it  This test is not yet approved or cleared by the [place]  FDA and has been authorized for detection and/or diagnosis of SARS-CoV-2 by FDA under an Emergency Use Authorization (EUA). This EUA will remain in effect (meaning this test can be used) for the duration of the COVID-19 declaration under Section 564(b)(1) of the Act, 21 U.S.C. section 360bbb-3(b)(1), unless the authorization is terminated or revoked.     Resp Syncytial Virus by PCR NEGATIVE NEGATIVE Final    Comment: (NOTE) Fact Sheet for Patients: BloggerCourse.com  Fact Sheet for Healthcare Providers: SeriousBroker.it  This test is not yet approved or cleared by the [place]  FDA and has been authorized for detection and/or  diagnosis of SARS-CoV-2 by FDA under an Emergency Use Authorization (EUA). This EUA will remain in effect (meaning this test can be used) for the duration of the COVID-19 declaration under Section 564(b)(1) of the Act, 21 U.S.C. section 360bbb-3(b)(1), unless the authorization is terminated or revoked.  Performed at [facility], [address], [place], [place] [address]  Blood Culture (routine x 2)     Status: None   Collection Time: 10/25/23 10:45 PM   Specimen: BLOOD  Result Value Ref Range Status   Specimen Description BLOOD BLOOD RIGHT ARM  Final   Special Requests NONE  Final   Culture   Final    NO GROWTH 5 DAYS Performed at [facility], [address], [place], [name]    Report Status 10/30/2023 FINAL  Final  Blood Culture (routine x 2)     Status: None   Collection Time: 10/25/23 10:47 PM   Specimen: BLOOD  Result Value Ref Range Status   Specimen Description BLOOD BLOOD RIGHT HAND  Final   Special Requests NONE  Final   Culture   Final    NO GROWTH 5 DAYS Performed at [facility], [address], [place], [place] [address]    Report Status 10/30/2023 FINAL  Final  MRSA Next Gen by PCR, Nasal     Status: None   Collection Time: 10/26/23 12:52 AM   Specimen: Nasal Mucosa; Nasal Swab  Result Value Ref Range Status   MRSA by PCR Next Gen NOT DETECTED NOT DETECTED Final    Comment: (NOTE) The GeneXpert MRSA Assay (FDA approved for NASAL specimens only), is one component of a comprehensive MRSA colonization surveillance program. It is not intended to diagnose MRSA infection nor to guide or monitor treatment for MRSA infections. Test performance is not FDA approved in patients less than 10 years old. Performed at [facility], [address], [place], [place] [address]   Respiratory (~20 pathogens) panel by PCR     Status: Abnormal   Collection Time: 10/26/23  2:06 PM   Specimen: Nasopharyngeal Swab; Respiratory  Result Value Ref Range Status   Adenovirus  NOT DETECTED NOT DETECTED Final   Coronavirus 229E NOT DETECTED NOT DETECTED Final    Comment: (NOTE) The Coronavirus on the Respiratory Panel, DOES NOT test for the novel  Coronavirus (2019 nCoV)    Coronavirus HKU1 NOT DETECTED NOT DETECTED Final   Coronavirus NL63 NOT DETECTED NOT DETECTED Final   Coronavirus OC43 NOT DETECTED NOT DETECTED Final   Metapneumovirus NOT DETECTED NOT DETECTED Final   Rhinovirus / Enterovirus NOT DETECTED NOT DETECTED Final   Influenza A H1 2009 DETECTED (A) NOT DETECTED Final   Influenza B NOT DETECTED NOT DETECTED Final   Parainfluenza Virus 1 NOT DETECTED NOT DETECTED Final   Parainfluenza Virus 2 NOT DETECTED NOT DETECTED Final   Parainfluenza Virus 3 NOT DETECTED NOT DETECTED Final   Parainfluenza Virus 4 NOT DETECTED NOT DETECTED Final   Respiratory Syncytial Virus NOT DETECTED NOT DETECTED Final   Bordetella pertussis NOT DETECTED NOT DETECTED Final   Bordetella Parapertussis NOT DETECTED NOT DETECTED Final   Chlamydophila pneumoniae NOT DETECTED NOT DETECTED Final   Mycoplasma pneumoniae NOT DETECTED NOT DETECTED Final    Comment: Performed at [facility], [address] [place], [place] [address]    Imaging reviewed:  CT ABDOMEN PELVIS W CONTRAST Result Date: 01/23/2024 CLINICAL DATA:  Nausea, vomiting, diarrhea, abdominal pain EXAM: CT ABDOMEN AND PELVIS WITH CONTRAST TECHNIQUE: Multidetector CT imaging of the abdomen and pelvis was performed using the standard protocol following bolus administration of intravenous contrast. RADIATION DOSE REDUCTION: This exam was performed according to the departmental dose-optimization program which includes automated exposure control, adjustment of the mA and/or kV according to patient size and/or use of iterative reconstruction technique. CONTRAST:  80mL OMNIPAQUE  IOHEXOL  350  MG/ML SOLN COMPARISON:  CT 10/19/2023 FINDINGS: Lower chest: 1.8 cm subpleural nodule in the right middle lobe and partially  visualized 2.2 cm nodule in the lingula (series 3/image 1). 1.7 cm nodule in the right middle lobe (series 3/image 3). These of increased compared to CT chest 10/19/2023. Hepatobiliary: Increased size of the hypoattenuating lesions in the liver. For example in the right hepatic lobe medially the lesion measures 1.5 cm, previously 0.7 cm (series 2/image 20); and the 1.6 cm lesion in the left hepatic lobe (2/16) previously measured 0.7 cm. Gallbladder and biliary tree are unremarkable. Pancreas: Unremarkable. Spleen: Unremarkable. Adrenals/Urinary Tract: Stable adrenal glands. No urinary calculi or hydronephrosis. Unremarkable bladder. Stomach/Bowel: Normal caliber large and small bowel. No bowel wall thickening. Stomach and appendix are within normal limits. Vascular/Lymphatic: Aortic atherosclerotic calcification. Unchanged left para-aortic lymph node measuring 1.7 x 1.1 cm (series 2/image 31). Reproductive: Increased distension of the endometrial canal with fluid now measuring 6.1 cm. Irregular nodular thickening and hyperenhancement surrounding the endometrial canal compatible with known carcinoma. No adnexal mass. Other: No free intraperitoneal fluid or air. Musculoskeletal: No acute fracture or destructive osseous lesion. Avascular necrosis right femoral head. IMPRESSION: 1. Progression of endometrial carcinoma since 10/19/2023 with increased distension of the endometrial canal now measuring 6.1 cm. 2. Worsening hepatic and pulmonary metastases. Similar left para-aortic lymphadenopathy. Aortic Atherosclerosis (ICD10-I70.0). Electronically Signed   By: Rozell Cornet M.D.   On: 01/23/2024 23:23   Personally reviewed CT A/P      ED Course:  Treated with 1 L IV fluid, Dilaudid , Zofran , pantoprazole .   Assessment/Plan:  53 y.o. female with hx metastatic cervical cancer with mets to lung, liver, peritoneum, currently on chemotherapy with Tivdak  last received 4/24, associated keratopathy; additional  history COPD, hypertension, smoking, who presents with persistent nausea and vomiting. Found to have acute liver injury.   Acute liver injury, hepatocellular  Acute and marked elevation in transaminase with AST 2530, ALT 855. T Bili 1.9, alk phos 89 -> Worsening LFT in short interval to AST 3233, ALT 1012, other LFT stable. Tylenol  <10, etoh neg. INR 1.6, Ammonia 21. CT A/P with Increased size of the hypoattenuating lesions in the liver. (Example R hepatic 0.7 -> 1.5 cm,  left hepatic lobe 0.7 -> 1.6 cm). Notably currently receiving chemotherapy with Tivdak  which does carry risk of liver injury. Overall suspect liver injury is mainly related to chemotherapy, appears out of proportion to magnitude of increased liver mets. No evidence of associated liver failure  - Discussed with Dr. Mordechai April (GI) re: possible DILI and use of NAC. Agrees with initiation of NAC for suspected DILI and will consult in the AM.  - Trend LFT, INR - Check hep A, B, C serology - Check liver vascular duplex - Needs further counseling on complete cessation of alcohol use in setting of severe liver injury.  Metastatic cervical cancer with progressive disease Follows with Dr. Cheree Cords. Hx of prior chemo, immunotherapy. Currently on Tivdak  with last cycle 4/24. CT A/P with progression of endometrial mass, hepatic lesions, lung lesions.  Unfortunately suspect also having toxicity from chemotherapy with liver injury and worsening involvement.  - Added Dr. Cheree Cords treatment team, can touch base in the morning.  Right eye corneal opacity, suspect worsening keratopathy from chemotherapy Known ophthalmic toxicity of Tivdak . Per oncology note history mild keratopathy and on Pred forte  today and Restasis.  Patient reporting worsening visual acuity in the right eye, increased pain with foreign body sensation.  On exam visual acuity is intact  to count finger in the right eye, but she is currently declining complete visual acuity testing  tonight because she wants to sleep.  There appear to be at least 2 areas of corneal opacity approximately 2 mm each not overlying the pupil.  - Would prefer to send [facility] to [facility] for [facility]mology evaluation with slit-lamp exam.  However patient declines transfer to outside facility.  - Will need to discuss plan of care with ophthalmology in the a.m. (not contacted overnight)  - For now start on trimethoprim /polymixin oph gtt QID (erythromycin allergy), add ophthalmic ointment nightly, and continue Pred forte  1% QID.   Intractable N/V  -Start on maintenance IV fluid LR at 100 cc an hour - Zofran , Compazine  prn  Smoking cessation  Current smoker at 0.5 PPD, she is contemplative about stopping. Declines NRT inpatient  -Counseled on smoking cessation, continue to offer NRT if she is interested.  Chronic medical problems: COPD: Continue equivalent of home Symbicort , a DuoNebs as needed Hypertension: Hold home amlodipine  in setting of volume depletion Chronic pain, cancer-related: Oxycodone  2.5/5 mg every 6 hours as needed for moderate/severe pain.  Can escalate if needed.  Body mass index is 26.63 kg/m.    DVT prophylaxis:  SCDs Code Status:  Full Code Diet:  Diet Orders (From admission, onward)    None      Family Communication:  None   Consults:  None   Admission status:   Inpatient, Telemetry bed  Severity of Illness: The appropriate patient status for this patient is INPATIENT. Inpatient status is judged to be reasonable and necessary in order to provide the required intensity of service to ensure the patient's safety. The patient's presenting symptoms, physical exam findings, and initial radiographic and laboratory data in the context of their chronic comorbidities is felt to place them at high risk for further clinical deterioration. Furthermore, it is not anticipated that the patient will be medically stable for discharge from the hospital within 2 midnights of admission.    * I certify that at the point of admission it is my clinical judgment that the patient will require inpatient hospital care spanning beyond 2 midnights from the point of admission due to high intensity of service, high risk for further deterioration and high frequency of surveillance required.*   Arnulfo Larch, MD [facility]  How to contact the [facility] Attending or Consulting provider 7A - 7P or covering provider during after hours 7P -7A, for this patient.  Check the care team in [facility] and look for a) attending/consulting [facility] provider listed and b) the [facility] team listed Log into [email or web address] and use [place]'s universal password to access. If you do not have the password, please contact the hospital operator. Locate the [facility] provider you are looking for under [facility] and page to a number that you can be directly reached. If you still have difficulty reaching the provider, please page the [facility] (Director on Call) for the Hospitalists listed on amion for assistance.  01/24/2024, 1:28 AM

## 2024-01-24 NOTE — Progress Notes (Signed)
   01/24/24 1449  TOC Assessment  TOC screening is complete Yes  Once discharged, how will the patient get to their discharge location? Family/Friend - Photographer  Expected Discharge Plan  (Home with family)  Final next level of care  (Home with family)  Barriers to Discharge Continued Medical Work up  Patient states their goals for this hospitalization and ongoing recovery are: Patient was not clear with her answers reports she is in pain  Living arrangements for the past 2 months Single Family Home  Share Information with NAME [place] Hoggard  Permission granted to share info w Relationship daughter  Permission granted to share info w Contact Information 336 [phone]  Patient language and need for interpreter reviewed: Yes  Criminal Activity/Legal Involvement Pertinent to Current Situation/Hospitalization No - Comment as needed  Need for Family Participation in Patient Care Y  Care giver support system in place? Y  Appearance: Appears stated age  Attitude/Demeanor/Rapport Guarded  Affect (typically observed) Stable  Orientation:  Oriented to Self;Oriented to Place;Oriented to  Time;Oriented to Situation   Admitted Transaminitis.  TOC will continue to monitor patient advancement through interdisciplinary progressions rounds.

## 2024-01-24 NOTE — Progress Notes (Signed)
 ASSUMPTION OF CARE NOTE   01/24/2024 11:22 AM  Kristin Carson was seen and examined.  The H&P by the admitting provider, orders, imaging was reviewed.  Please see new orders.  Will continue to follow.  GI consult requested.   53 y.o. 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  Reports onset Monday 5/19 but worsening especially over the past few days and now unable to tolerate any p.o.'s.  Emesis appears clear.  No hematemesis/coffee-ground emesis, hematochezia, melena.  No associated abdominal pain.  Mild diarrhea.  No fever, chills.  Does drink alcohol about 2 standard drinks per day but none in the past week.  Takes Tylenol  with maximum of 8 extra strength Tylenol  per day, denies other OTC medications containing Tylenol .  She was admitted with rising LFTs and concern for DILI and after discussion with GI attending started on NAC.    Vitals:   01/24/24 0607 01/24/24 0724  BP: 118/87 (!) 141/66  Pulse: (!) 114 99  Resp: (!) 27 (!) 27  Temp: 98 F (36.7 C) 98.3 F (36.8 C)  SpO2: 99% 100%    Results for orders placed or performed during the hospital encounter of 01/23/24  CBC with Differential   Collection Time: 01/23/24  9:14 PM  Result Value Ref Range   WBC 9.8 4.0 - 10.5 K/uL   RBC 3.47 (L) 3.87 - 5.11 MIL/uL   Hemoglobin 12.2 12.0 - 15.0 g/dL   HCT 78.2 (L) 95.6 - 21.3 %   MCV 102.9 (H) 80.0 - 100.0 fL   MCH 35.2 (H) 26.0 - 34.0 pg   MCHC 34.2 30.0 - 36.0 g/dL   RDW 08.6 (H) 57.8 - 46.9 %   Platelets 169 150 - 400 K/uL   nRBC 0.0 0.0 - 0.2 %   Neutrophils Relative % 81 %   Neutro Abs 7.9 (H) 1.7 - 7.7 K/uL   Lymphocytes Relative 13 %   Lymphs Abs 1.3 0.7 - 4.0 K/uL   Monocytes Relative 5 %   Monocytes Absolute 0.5 0.1 - 1.0 K/uL   Eosinophils Relative 1 %    Eosinophils Absolute 0.1 0.0 - 0.5 K/uL   Basophils Relative 0 %   Basophils Absolute 0.0 0.0 - 0.1 K/uL   Immature Granulocytes 0 %   Abs Immature Granulocytes 0.04 0.00 - 0.07 K/uL  Comprehensive metabolic panel   Collection Time: 01/23/24  9:14 PM  Result Value Ref Range   Sodium 133 (L) 135 - 145 mmol/L   Potassium 3.6 3.5 - 5.1 mmol/L   Chloride 103 98 - 111 mmol/L   CO2 19 (L) 22 - 32 mmol/L   Glucose, Bld 78 70 - 99 mg/dL   BUN 14 6 - 20 mg/dL   Creatinine, Ser 6.29 0.44 - 1.00 mg/dL   Calcium  8.9 8.9 - 10.3 mg/dL   Total Protein 6.8 6.5 - 8.1 g/dL   Albumin 3.1 (L) 3.5 - 5.0 g/dL   AST 5,284 (H) 15 - 41 U/L   ALT 855 (H) 0 - 44 U/L   Alkaline Phosphatase 89 38 - 126 U/L   Total Bilirubin 1.9 (H) 0.0 - 1.2 mg/dL   GFR, Estimated >13 >24 mL/min   Anion gap 11 5 - 15  Lipase, blood   Collection Time: 01/23/24  9:14 PM  Result Value Ref Range  Lipase 21 11 - 51 U/L  TSH   Collection Time: 01/24/24  1:06 AM  Result Value Ref Range   TSH 1.351 0.350 - 4.500 uIU/mL  Lactic acid, plasma   Collection Time: 01/24/24  1:06 AM  Result Value Ref Range   Lactic Acid, Venous 1.6 0.5 - 1.9 mmol/L  Acetaminophen  level   Collection Time: 01/24/24  1:06 AM  Result Value Ref Range   Acetaminophen  (Tylenol ), Serum <10 (L) 10 - 30 ug/mL  Ethanol   Collection Time: 01/24/24  1:06 AM  Result Value Ref Range   Alcohol, Ethyl (B) <15 <15 mg/dL  Ammonia   Collection Time: 01/24/24  1:06 AM  Result Value Ref Range   Ammonia 21 9 - 35 umol/L  Protime-INR   Collection Time: 01/24/24  1:06 AM  Result Value Ref Range   Prothrombin Time 18.9 (H) 11.4 - 15.2 seconds   INR 1.6 (H) 0.8 - 1.2  Basic metabolic panel with GFR   Collection Time: 01/24/24  1:06 AM  Result Value Ref Range   Sodium 133 (L) 135 - 145 mmol/L   Potassium 4.1 3.5 - 5.1 mmol/L   Chloride 105 98 - 111 mmol/L   CO2 20 (L) 22 - 32 mmol/L   Glucose, Bld 72 70 - 99 mg/dL   BUN 14 6 - 20 mg/dL   Creatinine, Ser 1.61  0.44 - 1.00 mg/dL   Calcium  8.3 (L) 8.9 - 10.3 mg/dL   GFR, Estimated >09 >60 mL/min   Anion gap 8 5 - 15  CBC   Collection Time: 01/24/24  1:06 AM  Result Value Ref Range   WBC 8.4 4.0 - 10.5 K/uL   RBC 3.48 (L) 3.87 - 5.11 MIL/uL   Hemoglobin 12.0 12.0 - 15.0 g/dL   HCT 45.4 (L) 09.8 - 11.9 %   MCV 103.2 (H) 80.0 - 100.0 fL   MCH 34.5 (H) 26.0 - 34.0 pg   MCHC 33.4 30.0 - 36.0 g/dL   RDW 14.7 (H) 82.9 - 56.2 %   Platelets 156 150 - 400 K/uL   nRBC 0.0 0.0 - 0.2 %  Hepatic function panel   Collection Time: 01/24/24  1:06 AM  Result Value Ref Range   Total Protein 6.4 (L) 6.5 - 8.1 g/dL   Albumin 2.9 (L) 3.5 - 5.0 g/dL   AST 1,308 (H) 15 - 41 U/L   ALT 1,012 (H) 0 - 44 U/L   Alkaline Phosphatase 84 38 - 126 U/L   Total Bilirubin 1.9 (H) 0.0 - 1.2 mg/dL   Bilirubin, Direct 1.0 (H) 0.0 - 0.2 mg/dL   Indirect Bilirubin 0.9 0.3 - 0.9 mg/dL  Magnesium    Collection Time: 01/24/24  1:06 AM  Result Value Ref Range   Magnesium  1.4 (L) 1.7 - 2.4 mg/dL  Phosphorus   Collection Time: 01/24/24  1:06 AM  Result Value Ref Range   Phosphorus 4.0 2.5 - 4.6 mg/dL  CK   Collection Time: 01/24/24  3:50 AM  Result Value Ref Range   Total CK 30 (L) 38 - 234 U/L     C. Lincoln Renshaw, MD [facility]   01/23/2024  8:17 PM How to contact the [facility] Attending or Consulting provider 7A - 7P or covering provider during after hours 7P -7A, for this patient?  Check the care team in [facility] and look for a) attending/consulting [facility] provider listed and b) the [facility] team listed Log into [email or web address] and use [place]'s universal password to access.  If you do not have the password, please contact the hospital operator. Locate the [facility] provider you are looking for under [facility] and page to a number that you can be directly reached. If you still have difficulty reaching the provider, please page the [facility] (Director on Call) for the Hospitalists listed on amion for assistance.

## 2024-01-24 NOTE — TOC Initial Note (Signed)
 Transition of Care [facility]) - Initial/Assessment Note    Patient Details  Name: Kristin Carson MRN: [id number] Date of Birth: 1971-06-24  [facility]) CM/SW Contact:    Lynda Sands, RN Phone Number: 01/24/2024, 2:58 PM  Clinical Narrative:      CM met with patient at bedside. Patient reports she is in a lot of pain, and she was not very clear with her responses. Patient reports she live an a house. Patient reported she has a home aide that come to assist with her adl's. Patient is independent with ambulation.          Expected Discharge Plan:  (Home with family) Barriers to Discharge: Continued Medical Work up   Patient Goals and CMS Choice Patient states their goals for this hospitalization and ongoing recovery are:: Patient was not clear with her answers reports she is in pain    Expected Discharge Plan and Services      Living arrangements for the past 2 months: Single Family Home                    Prior Living Arrangements/Services Living arrangements for the past 2 months: Single Family Home   Patient language and need for interpreter reviewed:: Yes        Need for Family Participation in Patient Care: Yes (Comment) Care giver support system in place?: Yes (comment)   Criminal Activity/Legal Involvement Pertinent to Current Situation/Hospitalization: No - Comment as needed  Activities of Daily Living   ADL Screening (condition at time of admission) Independently performs ADLs?: Yes (appropriate for developmental age) Is the patient deaf or have difficulty hearing?: No Does the patient have difficulty seeing, even when wearing glasses/contacts?: No Does the patient have difficulty concentrating, remembering, or making decisions?: No  Permission Sought/Granted      Share Information with NAME: [place] Hoggard     Permission granted to share info w Relationship: daughter  Permission granted to share info w Contact Information: [phone]  Emotional Assessment Appearance:: Appears stated age Attitude/Demeanor/Rapport: Guarded Affect (typically observed): Stable Orientation: : Oriented to Self, Oriented to Place, Oriented to  Time, Oriented to Situation      Admission diagnosis:  Endometrial cancer (HCC) [C54.1] Transaminitis [R74.01] Elevated liver enzymes [R74.8] Patient Active Problem List   Diagnosis Date Noted   Transaminitis 01/24/2024   Corneal abnormality 01/24/2024   Nausea & vomiting 01/24/2024   Drug-induced liver injury 01/24/2024   Hypomagnesemia 01/24/2024   Elevated liver enzymes 01/24/2024   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 11/24/2023   Encounter for smoking cessation counseling 10/29/2023   Acute respiratory failure with hypoxia (HCC) 10/26/2023   Hyponatremia 10/26/2023   Hypoalbuminemia due to protein-calorie malnutrition (HCC) 10/26/2023   Lactic acidosis 10/26/2023   Influenza A 10/25/2023   Uterine cancer (HCC) 10/15/2023   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   GERD (gastroesophageal reflux disease) 11/18/2022   Dermatitis 11/18/2022   MDD (major depressive disorder) 11/18/2022   Encounter for general adult medical examination with abnormal findings 11/18/2022   Iron deficiency anemia due to chronic blood loss 10/02/2022   Port-A-Cath in place 09/25/2022   Cervical cancer (HCC) 09/19/2022   Hypokalemia 08/30/2022   AKI (acute kidney injury) (HCC) 08/28/2022   Volume depletion 08/28/2022   Diarrhea 08/28/2022   COVID-19 virus infection 08/28/2022   Abdominal mass 08/28/2022   Essential hypertension 12/10/2016   COPD exacerbation (HCC) 12/10/2016   Cigarette nicotine  dependence  with nicotine -induced disorder 12/10/2016   Chronic kidney disease 12/10/2016   SOB (shortness of breath) 04/07/2013   Migraine 04/07/2013   PCP:  Tobi Fortes, MD Pharmacy:   [facility] [address] [place] - [address] [name] Phone: [phone] Fax: [phone]  [id number] - [place], [place] - [address] AT [facility] [address] [place] [address] Phone: [phone] Fax: [phone]     Social Drivers of Health (SDOH) Social History: SDOH Screenings   Food Insecurity: No Food Insecurity (01/24/2024)  Housing: Low Risk  (01/24/2024)  Transportation Needs: No Transportation Needs (01/24/2024)  Utilities: Not At Risk (01/24/2024)  Alcohol  Screen: Low Risk  (09/09/2022)  Depression (PHQ2-9): Low Risk  (11/23/2023)  Financial Resource Strain: Low Risk  (09/09/2022)  Physical Activity: Insufficiently Active (09/09/2022)  Social Connections: Socially Isolated (01/24/2024)  Stress: No Stress Concern Present (09/09/2022)  Tobacco Use: High Risk (01/23/2024)   SDOH Interventions:     Readmission Risk Interventions    10/26/2023    8:07 AM 08/29/2022    2:18 PM  Readmission Risk Prevention Plan  Transportation Screening Complete Complete  PCP or Specialist Appt within 5-7 Days  Complete  Home Care Screening Complete Complete  Medication Review (RN CM) Complete Complete

## 2024-01-24 NOTE — Plan of Care (Signed)

## 2024-01-24 NOTE — Progress Notes (Signed)
 0715 patient alert and orientated able to make all needs know patient refusing to keep BP cuff and SP02 on finger vitals all WNL an patient on room air no concerns with her refusing to wear them patient does allow vitals to be taken

## 2024-01-24 NOTE — Consult Note (Addendum)
 Consulting  Provider: Dr. Amy Kansky Primary Care Physician:  Tobi Fortes, MD Primary Gastroenterologist: Previously unassigned  Reason for Consultation: Acute hepatitis  HPI:  Kristin Carson is a 53 y.o. female with a past medical history of metastatic cervical cancer with mets to lung, liver, peritoneum, currently on chemotherapy with tisotumab vedotin , COPD, hypertension, smoking, who presented to [facility], ER, ER yesterday evening with intractable nausea and vomiting, poor p.o. intake.  Denies any hematemesis or coffee-ground emesis.  No associated abdominal pain.  Mild amount of diarrhea.  No fevers or chills.  In the ER found to have AST 2520, ALT 855, alk phos 89, T. bili 1.9, albumin 3.1.  These were repeated overnight which showed worsening in her enzymes to AST 3233, ALT 1012.  CT abdomen pelvis with contrast which I personally reviewed showed multiple lung nodules, increased size of hypoattenuating lesions in the liver the largest 1.6 cm, normal gallbladder.  Patient has been started on IV NAC for possible nonacetaminophen associated DILI.   Does report taking Tylenol  regularly, on average 3 to 4-day depending how much pain she is in.  Also chronic alcohol user drinking 1-2 drinks daily.  Denies any herbal supplements including ashwagandha, turmeric, green tea extract.  Denies any illicit drug use.  No exposure to viral hepatitis that she is aware of.  Denies any history of HSV.  Does note some cough congestion which is chronic for her due to underlying COPD.  Occasional mucus production.  No fevers or chills.  No new medications.    Past Medical History:  Diagnosis Date   Anemia    Asthma    Bronchitis    Cancer (HCC)    Cervical   CHF (congestive heart failure) (HCC)    COPD (chronic obstructive pulmonary disease) (HCC)    Depression    GERD (gastroesophageal reflux disease)    Headache    Hypertension    Port-A-Cath in place 09/25/2022    Past Surgical History:   Procedure Laterality Date   BREAST SURGERY     CESAREAN SECTION     IR IMAGING GUIDED PORT INSERTION  09/30/2022   LEG SURGERY      Prior to Admission medications   Medication Sig Start Date End Date Taking? Authorizing Provider  amLODipine  (NORVASC ) 10 MG tablet Take 1 tablet (10 mg total) by mouth daily. 11/23/23  Yes Tobi Fortes, MD  budesonide -formoterol  (SYMBICORT ) 160-4.5 MCG/ACT inhaler Inhale 2 puffs into the lungs in the morning and at bedtime. Patient taking differently: Inhale 2 puffs into the lungs daily as needed. 10/28/23  Yes Justina Oman, MD  Carboxymethylcellulose Sod PF 0.5 % SOLN Place 2 drops into both eyes 4 (four) times daily. Continue for 30 days after last dose of Tivdak  11/03/23  Yes Paulett Boros, MD  levalbuterol  (XOPENEX  HFA) 45 MCG/ACT inhaler Inhale 2 puffs into the lungs every 8 (eight) hours as needed for wheezing or shortness of breath. 10/28/23 10/27/24 Yes Justina Oman, MD  magnesium  oxide (MAG-OX) 400 (240 Mg) MG tablet Take 1 tablet (400 mg total) by mouth 3 (three) times daily. 12/24/23  Yes Paulett Boros, MD  oxyCODONE  (OXY IR/ROXICODONE ) 5 MG immediate release tablet Take 1 tablet (5 mg total) by mouth every 6 (six) hours as needed for severe pain (pain score 7-10). 12/24/23  Yes Paulett Boros, MD  prednisoLONE  acetate (PRED FORTE ) 1 % ophthalmic suspension Place 1 drop into both eyes 2 (two) times daily. 01/18/24  Yes [provider]  pregabalin  (  LYRICA ) 200 MG capsule Take 1 capsule (200 mg total) by mouth 3 (three) times daily. 12/03/23  Yes Paulett Boros, MD  acetaminophen  (TYLENOL ) 500 MG tablet Take 2 tablets (1,000 mg total) by mouth every 8 (eight) hours as needed for mild pain (pain score 1-3), fever or headache. Patient not taking: Reported on 01/24/2024 10/28/23   Justina Oman, MD  brimonidine  (ALPHAGAN  P) 0.1 % SOLN Place 3 drops into both eyes immediately prior to start of Tivdak  infusion 11/03/23   Paulett Boros, MD  cetirizine  (ZYRTEC  ALLERGY) 10 MG tablet Take 1 tablet (10 mg total) by mouth daily. Patient not taking: Reported on 01/24/2024 10/28/23   Justina Oman, MD  dexamethasone  (DECADRON ) 0.1 % ophthalmic suspension Instill 1 drop into both eyes immediately prior to Tivdak  and in the morning, in the evening and before bedtime for 72 hours after infusion Patient not taking: Reported on 01/24/2024 11/03/23   Paulett Boros, MD  lidocaine -prilocaine  (EMLA ) cream Apply a quarter-sized amount to port a cath site and cover with plastic wrap 1 hour prior to infusion appointments 12/03/23   Paulett Boros, MD  megestrol  (MEGACE ) 400 MG/10ML suspension Take 10 mLs (400 mg total) by mouth 2 (two) times daily. Patient not taking: Reported on 01/24/2024 06/09/23   Paulett Boros, MD  methocarbamol  (ROBAXIN ) 500 MG tablet Take 1 tablet (500 mg total) by mouth 2 (two) times daily as needed for muscle spasms. Patient not taking: Reported on 01/24/2024 12/19/23   Early Glisson, MD  methylPREDNISolone  (MEDROL  DOSEPAK) 4 MG TBPK tablet Taper over 6 days Patient not taking: Reported on 01/24/2024 12/19/23   Early Glisson, MD  pantoprazole  (PROTONIX ) 40 MG tablet Take 1 tablet (40 mg total) by mouth daily. Patient not taking: Reported on 01/24/2024 05/26/23   Tobi Fortes, MD  predniSONE  (DELTASONE ) 20 MG tablet Take 3 tablets by mouth daily x 1 day; then 2 tablet by mouth daily x 2 days; then 1 tablet by mouth daily x 3 days; then half tablet by mouth daily x 3 days and stop prednisone . Patient not taking: Reported on 01/24/2024 10/28/23   Justina Oman, MD  famotidine  (PEPCID ) 20 MG tablet Take 1 tablet (20 mg total) by mouth 2 (two) times daily. 12/18/17 02/02/20  Knapp, Iva, MD  hydrochlorothiazide  (HYDRODIURIL ) 25 MG tablet Take 1 tablet (25 mg total) by mouth daily. 06/17/19 02/02/20  Knapp, Iva, MD    Current Facility-Administered Medications  Medication Dose Route Frequency Provider Last Rate  Last Admin   acetylcysteine (ACETADOTE) 18,000 mg in dextrose 5 % 590 mL (30.5085 mg/mL) infusion  6.25 mg/kg/hr Intravenous Continuous Arnulfo Larch, MD 14.87 mL/hr at 01/24/24 1300 6.25 mg/kg/hr at 01/24/24 1300   artificial tears (LACRILUBE) ophthalmic ointment   Right Eye QHS Segars, Arlyce Lambert, MD       fluticasone furoate-vilanterol (BREO ELLIPTA) 200-25 MCG/ACT 1 puff  1 puff Inhalation Daily Segars, Arlyce Lambert, MD       ipratropium-albuterol  (DUONEB) 0.5-2.5 (3) MG/3ML nebulizer solution 3 mL  3 mL Nebulization Q6H PRN Arnulfo Larch, MD       lactated ringers  infusion   Intravenous Continuous Segars, Jonathan, MD 100 mL/hr at 01/24/24 1300 Infusion Verify at 01/24/24 1300   methocarbamol  (ROBAXIN ) tablet 500 mg  500 mg Oral BID PRN Segars, Jonathan, MD       nicotine  (NICODERM CQ  - dosed in mg/24 hours) patch 21 mg  21 mg Transdermal Daily PRN Johnson, Clanford L, MD       ondansetron  (ZOFRAN ) injection  4 mg  4 mg Intravenous Q6H PRN Arnulfo Larch, MD   4 mg at 01/24/24 6962   oxyCODONE  (Oxy IR/ROXICODONE ) immediate release tablet 2.5 mg  2.5 mg Oral Q6H PRN Arnulfo Larch, MD       Or   oxyCODONE  (Oxy IR/ROXICODONE ) immediate release tablet 5 mg  5 mg Oral Q6H PRN Arnulfo Larch, MD   5 mg at 01/24/24 1038   pantoprazole  (PROTONIX ) EC tablet 40 mg  40 mg Oral Daily Segars, Jonathan, MD   40 mg at 01/24/24 1031   polyvinyl alcohol (LIQUIFILM TEARS) 1.4 % ophthalmic solution 2 drop  2 drop Right Eye PRN Segars, Jonathan, MD       prednisoLONE  acetate (PRED FORTE ) 1 % ophthalmic suspension 1 drop  1 drop Right Eye BID Johnson, Clanford L, MD       pregabalin  (LYRICA ) capsule 200 mg  200 mg Oral TID Segars, Jonathan, MD   200 mg at 01/24/24 1030   prochlorperazine  (COMPAZINE ) injection 10 mg  10 mg Intravenous Q6H PRN Arnulfo Larch, MD   10 mg at 01/24/24 1039   sodium chloride  flush (NS) 0.9 % injection 3 mL  3 mL Intravenous Q12H Arnulfo Larch, MD   3 mL at 01/24/24 1031    trimethoprim -polymyxin b (POLYTRIM) ophthalmic solution 1 drop  1 drop Right Eye Q6H Arnulfo Larch, MD   1 drop at 01/24/24 0850    Allergies as of 01/23/2024 - Review Complete 01/23/2024  Allergen Reaction Noted   Carrot [daucus carota] Hives 08/29/2022   Lisinopril -hydrochlorothiazide   12/11/2017   Ibuprofen  Other (See Comments) 01/24/2015   Other Itching and Other (See Comments) 06/01/2023   Tramadol  Hives 12/10/2015   Erythromycin Hives 04/21/2012    Family History  Problem Relation Age of Onset   Dermatomyositis Mother    Hypertension Mother    Cancer Mother        lung   Cancer Father        lung   Heart disease Father    Lung cancer Paternal Uncle    Lung cancer Maternal Uncle    Colon cancer Neg Hx    Breast cancer Neg Hx    Ovarian cancer Neg Hx    Endometrial cancer Neg Hx    Pancreatic cancer Neg Hx    Prostate cancer Neg Hx     Social History   Socioeconomic History   Marital status: Married    Spouse name: Not on file   Number of children: Not on file   Years of education: Not on file   Highest education level: Not on file  Occupational History   Not on file  Tobacco Use   Smoking status: Every Day    Current packs/day: 0.25    Average packs/day: 0.3 packs/day for 23.0 years (5.8 ttl pk-yrs)    Types: Cigarettes   Smokeless tobacco: Never  Vaping Use   Vaping status: Never Used  Substance and Sexual Activity   Alcohol use: Yes    Comment: occ   Drug use: Yes    Types: Marijuana   Sexual activity: Yes    Birth control/protection: None  Other Topics Concern   Not on file  Social History Narrative   Not on file   Social Drivers of Health   Financial Resource Strain: Low Risk  (09/09/2022)   Overall Financial Resource Strain (CARDIA)    Difficulty of Paying Living Expenses: Not hard at all  Food Insecurity: No Food Insecurity (01/24/2024)   Hunger  Vital Sign    Worried About Programme researcher, broadcasting/film/video in the Last Year: Never true    Ran Out of  Food in the Last Year: Never true  Transportation Needs: No Transportation Needs (01/24/2024)   PRAPARE - Administrator, Civil Service (Medical): No    Lack of Transportation (Non-Medical): No  Physical Activity: Insufficiently Active (09/09/2022)   Exercise Vital Sign    Days of Exercise per Week: 2 days    Minutes of Exercise per Session: 20 min  Stress: No Stress Concern Present (09/09/2022)   [employer] of Occupational Health - Occupational Stress Questionnaire    Feeling of Stress : Only a little  Social Connections: Socially Isolated (01/24/2024)   Social Connection and Isolation Panel [NHANES]    Frequency of Communication with Friends and Family: More than three times a week    Frequency of Social Gatherings with Friends and Family: More than three times a week    Attends Religious Services: Never    Database administrator or Organizations: No    Attends Banker Meetings: Never    Marital Status: Separated  Intimate Partner Violence: Not At Risk (01/24/2024)   Humiliation, Afraid, Rape, and Kick questionnaire    Fear of Current or Ex-Partner: No    Emotionally Abused: No    Physically Abused: No    Sexually Abused: No    Review of Systems: General: Negative for anorexia, weight loss, fever, chills, fatigue, weakness. Eyes: Negative for vision changes.  ENT: Negative for hoarseness, difficulty swallowing , nasal congestion. CV: Negative for chest pain, angina, palpitations, dyspnea on exertion, peripheral edema.  Respiratory: Negative for dyspnea at rest, dyspnea on exertion, cough, sputum, wheezing.  GI: See history of present illness. GU:  Negative for dysuria, hematuria, urinary incontinence, urinary frequency, nocturnal urination.  MS: Negative for joint pain, low back pain.  Derm: Negative for rash or itching.  Neuro: Negative for weakness, abnormal sensation, seizure, frequent headaches, memory loss, confusion.  Psych: Negative for anxiety,  depression Endo: Negative for unusual weight change.  Heme: Negative for bruising or bleeding. Allergy: Negative for rash or hives.  Physical Exam: Vital signs in last 24 hours: Temp:  [97.5 F (36.4 C)-98.3 F (36.8 C)] 98.1 F (36.7 C) (05/25 1143) Pulse Rate:  [90-128] 99 (05/25 0724) Resp:  [15-27] 27 (05/25 0724) BP: (104-143)/(59-88) 141/66 (05/25 0724) SpO2:  [97 %-100 %] 100 % (05/25 0724) Weight:  [72.6 kg] 72.6 kg (05/24 2000) Last BM Date :  (PTA) General:   Alert,  Well-developed, well-nourished, pleasant and cooperative in NAD Head:  Normocephalic and atraumatic. Eyes:  Sclera clear, no icterus.   Conjunctiva pink. Ears:  Normal auditory acuity. Nose:  No deformity, discharge,  or lesions. Mouth:  No deformity or lesions, dentition normal. Neck:  Supple; no masses or thyromegaly. Lungs:  Clear throughout to auscultation.   No wheezes, crackles, or rhonchi. No acute distress. Heart:  Regular rate and rhythm; no murmurs, clicks, rubs,  or gallops. Abdomen:  Soft, nontender and nondistended. No masses, hepatosplenomegaly or hernias noted. Normal bowel sounds, without guarding, and without rebound.   Msk:  Symmetrical without gross deformities. Normal posture. Pulses:  Normal pulses noted. Extremities:  Without clubbing or edema. Neurologic:  Alert and  oriented x4;  grossly normal neurologically. Skin:  Intact without significant lesions or rashes. Cervical Nodes:  No significant cervical adenopathy. Psych:  Alert and cooperative. Normal mood and affect.  Intake/Output from previous day:  05/24 0701 - 05/25 0700 In: 379.9 [I.V.:329.9; IV Piggyback:50] Out: 100 [Urine:100] Intake/Output this shift: Total I/O In: 540.8 [I.V.:490.8; IV Piggyback:50] Out: -   Lab Results: Recent Labs    01/23/24 2114 01/24/24 0106  WBC 9.8 8.4  HGB 12.2 12.0  HCT 35.7* 35.9*  PLT 169 156   BMET Recent Labs    01/23/24 2114 01/24/24 0106  NA 133* 133*  K 3.6 4.1  CL  103 105  CO2 19* 20*  GLUCOSE 78 72  BUN 14 14  CREATININE 0.95 0.80  CALCIUM  8.9 8.3*   LFT Recent Labs    01/23/24 2114 01/24/24 0106  PROT 6.8 6.4*  ALBUMIN 3.1* 2.9*  AST 2,520* 3,233*  ALT 855* 1,012*  ALKPHOS 89 84  BILITOT 1.9* 1.9*  BILIDIR  --  1.0*  IBILI  --  0.9   PT/INR Recent Labs    01/24/24 0106  LABPROT 18.9*  INR 1.6*   Hepatitis Panel Recent Labs    01/24/24 0350  HEPBSAG NON REACTIVE   C-Diff No results for input(s): "CDIFFTOX" in the last 72 hours.  Studies/Results: CT ABDOMEN PELVIS W CONTRAST Result Date: 01/23/2024 CLINICAL DATA:  Nausea, vomiting, diarrhea, abdominal pain EXAM: CT ABDOMEN AND PELVIS WITH CONTRAST TECHNIQUE: Multidetector CT imaging of the abdomen and pelvis was performed using the standard protocol following bolus administration of intravenous contrast. RADIATION DOSE REDUCTION: This exam was performed according to the departmental dose-optimization program which includes automated exposure control, adjustment of the mA and/or kV according to patient size and/or use of iterative reconstruction technique. CONTRAST:  80mL OMNIPAQUE  IOHEXOL  350 MG/ML SOLN COMPARISON:  CT 10/19/2023 FINDINGS: Lower chest: 1.8 cm subpleural nodule in the right middle lobe and partially visualized 2.2 cm nodule in the lingula (series 3/image 1). 1.7 cm nodule in the right middle lobe (series 3/image 3). These of increased compared to CT chest 10/19/2023. Hepatobiliary: Increased size of the hypoattenuating lesions in the liver. For example in the right hepatic lobe medially the lesion measures 1.5 cm, previously 0.7 cm (series 2/image 20); and the 1.6 cm lesion in the left hepatic lobe (2/16) previously measured 0.7 cm. Gallbladder and biliary tree are unremarkable. Pancreas: Unremarkable. Spleen: Unremarkable. Adrenals/Urinary Tract: Stable adrenal glands. No urinary calculi or hydronephrosis. Unremarkable bladder. Stomach/Bowel: Normal caliber large and  small bowel. No bowel wall thickening. Stomach and appendix are within normal limits. Vascular/Lymphatic: Aortic atherosclerotic calcification. Unchanged left para-aortic lymph node measuring 1.7 x 1.1 cm (series 2/image 31). Reproductive: Increased distension of the endometrial canal with fluid now measuring 6.1 cm. Irregular nodular thickening and hyperenhancement surrounding the endometrial canal compatible with known carcinoma. No adnexal mass. Other: No free intraperitoneal fluid or air. Musculoskeletal: No acute fracture or destructive osseous lesion. Avascular necrosis right femoral head. IMPRESSION: 1. Progression of endometrial carcinoma since 10/19/2023 with increased distension of the endometrial canal now measuring 6.1 cm. 2. Worsening hepatic and pulmonary metastases. Similar left para-aortic lymphadenopathy. Aortic Atherosclerosis (ICD10-I70.0). Electronically Signed   By: Rozell Cornet M.D.   On: 01/23/2024 23:23    Assessment: *Acute liver injury/hepatitis *Abnormal LFTs *Liver metastases *Nausea and vomiting  Plan: Etiology of patient's acute liver injury/hepatitis unclear.  Possibly DILI related from cancer treatment with tisotumab vedotin .  CT did show a few small liver mets which have increased in size slightly though still rather small.  Patient's family reports patient has had decreased appetite with poor oral intake.in addition to her intractable nausea and vomiting, possible dehydration/ischemic hepatopathy.  Agree  with ultrasound with Doppler.  Further viral hepatitis tests pending.  Will add on HCVRNA, HSV, autoimmune markers, EBV.  Needs daily LFTs and coags.  Fortunately no evidence of acute liver failure today.  Not encephalopathic.  No asterixis on exam.  Continue to monitor.  Given her metastatic cancer, patient is likely not a candidate for liver transplant at this juncture if her condition worsens.  That being said, if she develops encephalopathy, she would  likely be better served at a larger center for management.  Agree with IV NAC for possible nonacetaminophen induced DILI x72 hours.   Consider echocardiogram to rule out congestive hepatopathy if she has not had a recent study.  Counseled on absolute alcohol cessation going forward.    Otherwise supportive care.  GI to continue to follow.  Rolando Cliche. Mordechai April, D.O. Gastroenterology and Hepatology [facility]    LOS: 0 days     01/24/2024, 2:10 PM

## 2024-01-24 NOTE — Hospital Course (Signed)
 53 y.o. 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  Reports onset Monday 5/19 but worsening especially over the past few days and now unable to tolerate any p.o.'s.  Emesis appears clear.  No hematemesis/coffee-ground emesis, hematochezia, melena.  No associated abdominal pain.  Mild diarrhea.  No fever, chills.  Does drink alcohol about 2 standard drinks per day but none in the past week.  Takes Tylenol  with maximum of 8 extra strength Tylenol  per day, denies other OTC medications containing Tylenol .  She was admitted with rising LFTs and concern for DILI and after discussion with GI attending started on NAC.

## 2024-01-25 ENCOUNTER — Inpatient Hospital Stay (HOSPITAL_COMMUNITY)

## 2024-01-25 DIAGNOSIS — C539 Malignant neoplasm of cervix uteri, unspecified: Secondary | ICD-10-CM | POA: Diagnosis not present

## 2024-01-25 DIAGNOSIS — K719 Toxic liver disease, unspecified: Secondary | ICD-10-CM | POA: Diagnosis not present

## 2024-01-25 DIAGNOSIS — R748 Abnormal levels of other serum enzymes: Secondary | ICD-10-CM | POA: Diagnosis not present

## 2024-01-25 DIAGNOSIS — R7401 Elevation of levels of liver transaminase levels: Secondary | ICD-10-CM | POA: Diagnosis not present

## 2024-01-25 LAB — CBC
HCT: 30.2 % — ABNORMAL LOW (ref 36.0–46.0)
Hemoglobin: 10.2 g/dL — ABNORMAL LOW (ref 12.0–15.0)
MCH: 34.6 pg — ABNORMAL HIGH (ref 26.0–34.0)
MCHC: 33.8 g/dL (ref 30.0–36.0)
MCV: 102.4 fL — ABNORMAL HIGH (ref 80.0–100.0)
Platelets: 95 10*3/uL — ABNORMAL LOW (ref 150–400)
RBC: 2.95 MIL/uL — ABNORMAL LOW (ref 3.87–5.11)
RDW: 16.5 % — ABNORMAL HIGH (ref 11.5–15.5)
WBC: 7 10*3/uL (ref 4.0–10.5)
nRBC: 0 % (ref 0.0–0.2)

## 2024-01-25 LAB — COMPREHENSIVE METABOLIC PANEL WITH GFR
ALT: 1764 U/L — ABNORMAL HIGH (ref 0–44)
AST: 4223 U/L — ABNORMAL HIGH (ref 15–41)
Albumin: 2.3 g/dL — ABNORMAL LOW (ref 3.5–5.0)
Alkaline Phosphatase: 80 U/L (ref 38–126)
Anion gap: 6 (ref 5–15)
BUN: 10 mg/dL (ref 6–20)
CO2: 23 mmol/L (ref 22–32)
Calcium: 8.4 mg/dL — ABNORMAL LOW (ref 8.9–10.3)
Chloride: 101 mmol/L (ref 98–111)
Creatinine, Ser: 0.81 mg/dL (ref 0.44–1.00)
GFR, Estimated: >60 mL/min (ref >60)
Glucose, Bld: 103 mg/dL — ABNORMAL HIGH (ref 70–99)
Potassium: 3.2 mmol/L — ABNORMAL LOW (ref 3.5–5.1)
Sodium: 130 mmol/L — ABNORMAL LOW (ref 135–145)
Total Bilirubin: 2 mg/dL — ABNORMAL HIGH (ref 0.0–1.2)
Total Protein: 5.3 g/dL — ABNORMAL LOW (ref 6.5–8.1)

## 2024-01-25 LAB — MRSA NEXT GEN BY PCR, NASAL: MRSA by PCR Next Gen: NOT DETECTED — AB

## 2024-01-25 LAB — PROTIME-INR
INR: 2.5 — ABNORMAL HIGH (ref 0.8–1.2)
Prothrombin Time: 27.2 s — ABNORMAL HIGH (ref 11.4–15.2)

## 2024-01-25 LAB — HCV RNA QUANT: HCV Quantitative: NOT DETECTED [IU]/mL (ref >50)

## 2024-01-25 LAB — AMMONIA: Ammonia: 48 umol/L — ABNORMAL HIGH (ref 9–35)

## 2024-01-25 LAB — MAGNESIUM: Magnesium: 1.9 mg/dL (ref 1.7–2.4)

## 2024-01-25 MED ORDER — VALGANCICLOVIR HCL 450 MG PO TABS
900.0000 mg | ORAL_TABLET | Freq: Two times a day (BID) | ORAL | Status: AC
  Administered 2024-01-25 (×2): 900 mg via ORAL
  Filled 2024-01-25 (×2): qty 2

## 2024-01-25 MED ORDER — PREDNISOLONE 5 MG PO TABS
60.0000 mg | ORAL_TABLET | Freq: Every day | ORAL | Status: DC
  Administered 2024-01-25 – 2024-01-27 (×3): 60 mg via ORAL
  Filled 2024-01-25 (×4): qty 12

## 2024-01-25 MED ORDER — CHLORHEXIDINE GLUCONATE CLOTH 2 % EX PADS
6.0000 | MEDICATED_PAD | Freq: Every day | CUTANEOUS | Status: DC
  Administered 2024-01-25 – 2024-01-27 (×3): 6 via TOPICAL

## 2024-01-25 MED ORDER — VALGANCICLOVIR HCL 450 MG PO TABS
900.0000 mg | ORAL_TABLET | Freq: Every day | ORAL | Status: DC
  Filled 2024-01-25 (×2): qty 2

## 2024-01-25 MED ORDER — POTASSIUM CHLORIDE CRYS ER 20 MEQ PO TBCR
40.0000 meq | EXTENDED_RELEASE_TABLET | Freq: Once | ORAL | Status: AC
  Administered 2024-01-25: 40 meq via ORAL
  Filled 2024-01-25: qty 2

## 2024-01-25 NOTE — Progress Notes (Addendum)
 PROGRESS NOTE   Kristin Carson  ZHY:[id number] DOB: 1971/08/31 DOA: 01/23/2024 PCP: Kristin Fortes, MD   Chief Complaint  Patient presents with   Nausea   Emesis   Level of care: Telemetry  Brief Admission History:  53 y.o. 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  Reports onset Monday 5/19 but worsening especially over the past few days and now unable to tolerate any p.o.'s.  Emesis appears clear.  No hematemesis/coffee-ground emesis, hematochezia, melena.  No associated abdominal pain.  Mild diarrhea.  No fever, chills.  Does drink alcohol about 2 standard drinks per day but none in the past week.  Takes Tylenol  with maximum of 8 extra strength Tylenol  per day, denies other OTC medications containing Tylenol .  She was admitted with rising LFTs and concern for DILI and after discussion with GI attending started on NAC.      Assessment and Plan:  Acute Liver Injury Transaminitis  Question of DILI  -- appreciate GI team consult and recommendations -- pt had recently received Tivdak  for chemotherapy treatment in April -- LFTs and PT/INR worsened today  -- discussed with Dr. Mordechai April who is reaching out to hepatology and will make a decision about transfer to higher level of care after conferencing with oncologist and hepatology -- continue supportive therapies -- continue 72 hours infusion of NAC -- hepatitis serologies pending   Advancing metastatic cervical cancer  -- CT abd/pelvis demonstrated progressive metastases in liver and lungs -- Dr. Mordechai April is reaching out to oncologist on call   Nausea and vomiting -- improved with supportive care -- patient currently tolerating a diet   Essential HTN  -- BPs remain soft, holding antihypertensive   COPD  -- no signs  or symptoms of exacerbation  -- bronchodilators PRN   Hyponatremia -- stable Na, asymptomatic -- following  Macrocytic Anemia -- Hg down to 10 from 12 likely from hemodilution  -- following CBC  Thrombocytopenia  -- worrisome finding of progressive liver injury  -- no active bleeding, follow  -- pt not on any heparin  products  Hypokalemia -- orally replace today and recheck in AM   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   Objective: Vitals:   01/25/24 0000 01/25/24 0100 01/25/24 0200 01/25/24 0850  BP: (!) 98/57  93/61   Pulse:      Resp: 20 17 16    Temp:    99 F (37.2 C)  TempSrc:    Oral  SpO2: 98%  98%   Weight:      Height:        Intake/Output Summary (Last 24 hours) at 01/25/2024 1132 Last data filed at 01/25/2024 0309 Gross per 24 hour  Intake 1716.73 ml  Output --  Net 1716.73 ml   Filed Weights   01/23/24 2000  Weight: 72.6 kg   Examination:  General exam: Appears calm and comfortable  Respiratory system: Clear to auscultation. Respiratory effort normal. Cardiovascular system: normal S1 & S2 heard. No JVD, murmurs, rubs, gallops or clicks. No pedal edema.  Gastrointestinal system: Abdomen is nondistended, soft and nontender. No organomegaly or masses felt. Normal bowel sounds heard. Central nervous system: Alert and oriented. No focal neurological deficits. Extremities: Symmetric 5 x 5 power. Skin: No rashes, lesions or ulcers. Psychiatry: Judgement and insight appear normal. Mood & affect appropriate.   Data Reviewed: I have personally reviewed following labs and imaging studies  CBC: Recent Labs  Lab 01/23/24 2114 01/24/24 0106 01/25/24 0311   WBC 9.8 8.4 7.0  NEUTROABS 7.9*  --   --   HGB 12.2 12.0 10.2*  HCT 35.7* 35.9* 30.2*  MCV 102.9* 103.2* 102.4*  PLT 169 156 95*    Basic Metabolic Panel: Recent Labs  Lab 01/23/24 2114 01/24/24 0106 01/25/24 0311  NA 133* 133* 130*  K 3.6 4.1 3.2*  CL 103 105 101  CO2 19* 20* 23  GLUCOSE 78 72 103*  BUN 14 14 10   CREATININE 0.95 0.80 0.81  CALCIUM  8.9 8.3* 8.4*  MG  --  1.4* 1.9  PHOS  --  4.0  --     CBG: No results for input(s): "GLUCAP" in the last 168 hours.  Recent Results (from the past 240 hours)  MRSA Next Gen by PCR, Nasal     Status: Abnormal   Collection Time: 01/25/24  5:26 AM   Specimen: Nasal Mucosa; Nasal Swab  Result Value Ref Range Status   MRSA by PCR Next Gen NONE DETECTED (A) NOT DETECTED Final    Comment:        The GeneXpert MRSA Assay (FDA approved for NASAL specimens only), is one component of a comprehensive MRSA colonization surveillance program. It is not intended to diagnose MRSA infection nor to guide or monitor treatment for MRSA infections. Performed at [facility], [name] [name], [name]      Radiology Studies: ECHOCARDIOGRAM COMPLETE Result Date: 01/24/2024    ECHOCARDIOGRAM REPORT   Patient Name:   Kristin Carson Date of Exam: 01/24/2024 Medical Rec #:  [id number]    Height:       65.0 in Accession #:    [id number]   Weight:       160.0 lb Date of Birth:  01-Jun-1971     BSA:          1.799 m Patient Age:    53 years     BP:           141/66 mmHg Patient Gender: F            HR:           97 bpm. Exam Location:  [name] Procedure: 2D Echo, Color Doppler and Cardiac Doppler (Both Spectral and Color            Flow Doppler were utilized during procedure). Indications:    Dyspnea  History:        Patient has no prior history of Echocardiogram examinations.                 CHF, COPD; Risk Factors:Hypertension.  Sonographer:    Astrid Blamer Referring Phys: [phone] [name] IMPRESSIONS  1. Left ventricular  ejection fraction, by estimation, is 60 to 65%. The left ventricle has normal function. The left ventricle has no regional wall motion abnormalities. Left ventricular diastolic parameters are consistent with Grade I diastolic dysfunction (impaired relaxation).  2. Right ventricular systolic function is normal. The right ventricular size is normal.  3. The mitral valve is normal in structure. Trivial  mitral valve regurgitation. No evidence of mitral stenosis.  4. The aortic valve has an indeterminant number of cusps. Aortic valve regurgitation is mild. No aortic stenosis is present. Comparison(s): No prior Echocardiogram. FINDINGS  Left Ventricle: Left ventricular ejection fraction, by estimation, is 60 to 65%. The left ventricle has normal function. The left ventricle has no regional wall motion abnormalities. The left ventricular internal cavity size was normal in size. There is  no left ventricular hypertrophy. Left ventricular diastolic parameters are consistent with Grade I diastolic dysfunction (impaired relaxation). Right Ventricle: The right ventricular size is normal. Right ventricular systolic function is normal. Left Atrium: Left atrial size was normal in size. Right Atrium: Right atrial size was normal in size. Pericardium: There is no evidence of pericardial effusion. Mitral Valve: The mitral valve is normal in structure. Mild mitral annular calcification. Trivial mitral valve regurgitation. No evidence of mitral valve stenosis. Tricuspid Valve: The tricuspid valve is normal in structure. Tricuspid valve regurgitation is not demonstrated. No evidence of tricuspid stenosis. Aortic Valve: The aortic valve has an indeterminant number of cusps. Aortic valve regurgitation is mild. Aortic regurgitation PHT measures 787 msec. No aortic stenosis is present. Aortic valve mean gradient measures 5.0 mmHg. Aortic valve peak gradient measures 9.2 mmHg. Aortic valve area, by VTI measures 2.87 cm. Pulmonic Valve: The  pulmonic valve was normal in structure. Pulmonic valve regurgitation is not visualized. No evidence of pulmonic stenosis. Aorta: The aortic root is normal in size and structure. Venous: The inferior vena cava was not well visualized. IAS/Shunts: No atrial level shunt detected by color flow Doppler.  LEFT VENTRICLE PLAX 2D LVIDd:         4.60 cm   Diastology LVIDs:         3.00 cm   LV e' medial:    6.09 cm/s LV PW:         1.10 cm   LV E/e' medial:  14.3 LV IVS:        1.10 cm   LV e' lateral:   7.72 cm/s LVOT diam:     2.00 cm   LV E/e' lateral: 11.3 LV SV:         73 LV SV Index:   41 LVOT Area:     3.14 cm  RIGHT VENTRICLE RV S prime:     12.40 cm/s TAPSE (M-mode): 2.1 cm LEFT ATRIUM             Index        RIGHT ATRIUM          Index LA Vol (A2C):   43.3 ml 24.07 ml/m  RA Area:     8.56 cm LA Vol (A4C):   37.8 ml 21.01 ml/m  RA Volume:   13.70 ml 7.61 ml/m LA Biplane Vol: 43.3 ml 24.07 ml/m  AORTIC VALVE AV Area (Vmax):    2.65 cm AV Area (Vmean):   2.67 cm AV Area (VTI):     2.87 cm AV Vmax:           152.00 cm/s AV Vmean:          107.000 cm/s AV VTI:            0.254 m AV Peak Grad:      9.2 mmHg AV Mean Grad:      5.0 mmHg LVOT Vmax:         128.00 cm/s LVOT Vmean:        91.100 cm/s LVOT VTI:  0.232 m LVOT/AV VTI ratio: 0.91 AI PHT:            787 msec  AORTA Ao Root diam: 2.80 cm MITRAL VALVE MV Area (PHT): 3.12 cm     SHUNTS MV Decel Time: 243 msec     Systemic VTI:  0.23 m MV E velocity: 87.00 cm/s   Systemic Diam: 2.00 cm MV A velocity: 102.00 cm/s MV E/A ratio:  0.85 Alexandria Angel MD Electronically signed by Alexandria Angel MD Signature Date/Time: 01/24/2024/4:48:40 PM    Final    CT ABDOMEN PELVIS W CONTRAST Result Date: 01/23/2024 CLINICAL DATA:  Nausea, vomiting, diarrhea, abdominal pain EXAM: CT ABDOMEN AND PELVIS WITH CONTRAST TECHNIQUE: Multidetector CT imaging of the abdomen and pelvis was performed using the standard protocol following bolus administration of intravenous  contrast. RADIATION DOSE REDUCTION: This exam was performed according to the departmental dose-optimization program which includes automated exposure control, adjustment of the mA and/or kV according to patient size and/or use of iterative reconstruction technique. CONTRAST:  80mL OMNIPAQUE  IOHEXOL  350 MG/ML SOLN COMPARISON:  CT 10/19/2023 FINDINGS: Lower chest: 1.8 cm subpleural nodule in the right middle lobe and partially visualized 2.2 cm nodule in the lingula (series 3/image 1). 1.7 cm nodule in the right middle lobe (series 3/image 3). These of increased compared to CT chest 10/19/2023. Hepatobiliary: Increased size of the hypoattenuating lesions in the liver. For example in the right hepatic lobe medially the lesion measures 1.5 cm, previously 0.7 cm (series 2/image 20); and the 1.6 cm lesion in the left hepatic lobe (2/16) previously measured 0.7 cm. Gallbladder and biliary tree are unremarkable. Pancreas: Unremarkable. Spleen: Unremarkable. Adrenals/Urinary Tract: Stable adrenal glands. No urinary calculi or hydronephrosis. Unremarkable bladder. Stomach/Bowel: Normal caliber large and small bowel. No bowel wall thickening. Stomach and appendix are within normal limits. Vascular/Lymphatic: Aortic atherosclerotic calcification. Unchanged left para-aortic lymph node measuring 1.7 x 1.1 cm (series 2/image 31). Reproductive: Increased distension of the endometrial canal with fluid now measuring 6.1 cm. Irregular nodular thickening and hyperenhancement surrounding the endometrial canal compatible with known carcinoma. No adnexal mass. Other: No free intraperitoneal fluid or air. Musculoskeletal: No acute fracture or destructive osseous lesion. Avascular necrosis right femoral head. IMPRESSION: 1. Progression of endometrial carcinoma since 10/19/2023 with increased distension of the endometrial canal now measuring 6.1 cm. 2. Worsening hepatic and pulmonary metastases. Similar left para-aortic lymphadenopathy.  Aortic Atherosclerosis (ICD10-I70.0). Electronically Signed   By: Rozell Cornet M.D.   On: 01/23/2024 23:23    Scheduled Meds:  artificial tears   Right Eye QHS   Chlorhexidine  Gluconate Cloth  6 each Topical Daily   fluticasone furoate-vilanterol  1 puff Inhalation Daily   pantoprazole   40 mg Oral Daily   prednisoLONE  acetate  1 drop Right Eye BID   pregabalin   200 mg Oral TID   sodium chloride  flush  3 mL Intravenous Q12H   trimethoprim -polymyxin b  1 drop Right Eye Q6H   Continuous Infusions:  acetylcysteine 6.25 mg/kg/hr (01/25/24 0309)   lactated ringers  100 mL/hr at 01/25/24 0309     LOS: 1 day   Critical Care Procedure Note Authorized and Performed by: Olga Berthold MD  Total Critical Care time:  58 mins Due to a high probability of clinically significant, life threatening deterioration, the patient required my highest level of preparedness to intervene emergently and I personally spent this critical care time directly and personally managing the patient.  This critical care time included obtaining a history; examining the patient, pulse  oximetry; ordering and review of studies; arranging urgent treatment with development of a management plan; evaluation of patient's response of treatment; frequent reassessment; and discussions with other providers.  This critical care time was performed to assess and manage the high probability of imminent and life threatening deterioration that could result in multi-organ failure.  It was exclusive of separately billable procedures and treating other patients and teaching time.    Faustino Hook, MD How to contact the [facility] Attending or Consulting provider 7A - 7P or covering provider during after hours 7P -7A, for this patient?  Check the care team in [facility] and look for a) attending/consulting [facility] provider listed and b) the [facility] team listed Log into [email or web address] to find provider on call.  Locate the [facility] provider you are looking for under [facility] and page to a number that you can be directly reached. If you still have difficulty reaching the provider, please page the [facility] (Director on Call) for the Hospitalists listed on amion for assistance.  01/25/2024, 11:32 AM

## 2024-01-25 NOTE — Plan of Care (Signed)

## 2024-01-25 NOTE — Progress Notes (Signed)
 Subjective: Patient without complaints me today.  Denies any abdominal pain.  No nausea or vomiting.  States her appetite is the best that has been in a few days.  No confusion.  Objective: Vital signs in last 24 hours: Temp:  [98.2 F (36.8 C)-99 F (37.2 C)] 98.9 F (37.2 C) (05/26 1205) Resp:  [16-23] 16 (05/26 0200) BP: (86-128)/(57-74) 93/61 (05/26 0200) SpO2:  [96 %-98 %] 98 % (05/26 0200) Last BM Date :  (PTA) General:   Alert and oriented, pleasant Head:  Normocephalic and atraumatic. Eyes:  No icterus, sclera clear. Conjuctiva pink.  Abdomen:  Bowel sounds present, soft, non-tender, non-distended. No HSM or hernias noted. No rebound or guarding. No masses appreciated  Msk:  Symmetrical without gross deformities. Normal posture. Extremities:  Without clubbing or edema. Neurologic:  Alert and  oriented x4;  grossly normal neurologically. Skin:  Warm and dry, intact without significant lesions.  Cervical Nodes:  No significant cervical adenopathy. Psych:  Alert and cooperative. Normal mood and affect.  Intake/Output from previous day: 05/25 0701 - 05/26 0700 In: 2120.6 [I.V.:2070.6; IV Piggyback:50] Out: -  Intake/Output this shift: No intake/output data recorded.  Lab Results: Recent Labs    01/23/24 2114 01/24/24 0106 01/25/24 0311  WBC 9.8 8.4 7.0  HGB 12.2 12.0 10.2*  HCT 35.7* 35.9* 30.2*  PLT 169 156 95*   BMET Recent Labs    01/23/24 2114 01/24/24 0106 01/25/24 0311  NA 133* 133* 130*  K 3.6 4.1 3.2*  CL 103 105 101  CO2 19* 20* 23  GLUCOSE 78 72 103*  BUN 14 14 10   CREATININE 0.95 0.80 0.81  CALCIUM  8.9 8.3* 8.4*   LFT Recent Labs    01/23/24 2114 01/24/24 0106 01/25/24 0311  PROT 6.8 6.4* 5.3*  ALBUMIN 3.1* 2.9* 2.3*  AST 2,520* 3,233* 4,223*  ALT 855* 1,012* 1,764*  ALKPHOS 89 84 80  BILITOT 1.9* 1.9* 2.0*  BILIDIR  --  1.0*  --   IBILI  --  0.9  --    PT/INR Recent Labs    01/24/24 0106 01/25/24 0311  LABPROT 18.9* 27.2*   INR 1.6* 2.5*   Hepatitis Panel Recent Labs    01/24/24 0350  HEPBSAG NON REACTIVE     Studies/Results: ECHOCARDIOGRAM COMPLETE Result Date: 01/24/2024    ECHOCARDIOGRAM REPORT   Patient Name:   Kristin Carson Date of Exam: 01/24/2024 Medical Rec #:  [id number]    Height:       65.0 in Accession #:    [id number]   Weight:       160.0 lb Date of Birth:  11-Apr-1971     BSA:          1.799 m Patient Age:    53 years     BP:           141/66 mmHg Patient Gender: F            HR:           97 bpm. Exam [name] Procedure: 2D Echo, Color Doppler and Cardiac Doppler (Both Spectral and Color            Flow Doppler were utilized during procedure). Indications:    Dyspnea  History:        Patient has no prior history of Echocardiogram examinations.                 CHF, COPD; Risk Factors:Hypertension.  Sonographer:  Astrid Blamer Referring Phys: [id number] CLANFORD L JOHNSON IMPRESSIONS  1. Left ventricular ejection fraction, by estimation, is 60 to 65%. The left ventricle has normal function. The left ventricle has no regional wall motion abnormalities. Left ventricular diastolic parameters are consistent with Grade I diastolic dysfunction (impaired relaxation).  2. Right ventricular systolic function is normal. The right ventricular size is normal.  3. The mitral valve is normal in structure. Trivial mitral valve regurgitation. No evidence of mitral stenosis.  4. The aortic valve has an indeterminant number of cusps. Aortic valve regurgitation is mild. No aortic stenosis is present. Comparison(s): No prior Echocardiogram. FINDINGS  Left Ventricle: Left ventricular ejection fraction, by estimation, is 60 to 65%. The left ventricle has normal function. The left ventricle has no regional wall motion abnormalities. The left ventricular internal cavity size was normal in size. There is  no left ventricular hypertrophy. Left ventricular diastolic parameters are consistent with Grade I diastolic dysfunction  (impaired relaxation). Right Ventricle: The right ventricular size is normal. Right ventricular systolic function is normal. Left Atrium: Left atrial size was normal in size. Right Atrium: Right atrial size was normal in size. Pericardium: There is no evidence of pericardial effusion. Mitral Valve: The mitral valve is normal in structure. Mild mitral annular calcification. Trivial mitral valve regurgitation. No evidence of mitral valve stenosis. Tricuspid Valve: The tricuspid valve is normal in structure. Tricuspid valve regurgitation is not demonstrated. No evidence of tricuspid stenosis. Aortic Valve: The aortic valve has an indeterminant number of cusps. Aortic valve regurgitation is mild. Aortic regurgitation PHT measures 787 msec. No aortic stenosis is present. Aortic valve mean gradient measures 5.0 mmHg. Aortic valve peak gradient measures 9.2 mmHg. Aortic valve area, by VTI measures 2.87 cm. Pulmonic Valve: The pulmonic valve was normal in structure. Pulmonic valve regurgitation is not visualized. No evidence of pulmonic stenosis. Aorta: The aortic root is normal in size and structure. Venous: The inferior vena cava was not well visualized. IAS/Shunts: No atrial level shunt detected by color flow Doppler.  LEFT VENTRICLE PLAX 2D LVIDd:         4.60 cm   Diastology LVIDs:         3.00 cm   LV e' medial:    6.09 cm/s LV PW:         1.10 cm   LV E/e' medial:  14.3 LV IVS:        1.10 cm   LV e' lateral:   7.72 cm/s LVOT diam:     2.00 cm   LV E/e' lateral: 11.3 LV SV:         73 LV SV Index:   41 LVOT Area:     3.14 cm  RIGHT VENTRICLE RV S prime:     12.40 cm/s TAPSE (M-mode): 2.1 cm LEFT ATRIUM             Index        RIGHT ATRIUM          Index LA Vol (A2C):   43.3 ml 24.07 ml/m  RA Area:     8.56 cm LA Vol (A4C):   37.8 ml 21.01 ml/m  RA Volume:   13.70 ml 7.61 ml/m LA Biplane Vol: 43.3 ml 24.07 ml/m  AORTIC VALVE AV Area (Vmax):    2.65 cm AV Area (Vmean):   2.67 cm AV Area (VTI):     2.87 cm AV  Vmax:           152.00 cm/s  AV Vmean:          107.000 cm/s AV VTI:            0.254 m AV Peak Grad:      9.2 mmHg AV Mean Grad:      5.0 mmHg LVOT Vmax:         128.00 cm/s LVOT Vmean:        91.100 cm/s LVOT VTI:          0.232 m LVOT/AV VTI ratio: 0.91 AI PHT:            787 msec  AORTA Ao Root diam: 2.80 cm MITRAL VALVE MV Area (PHT): 3.12 cm     SHUNTS MV Decel Time: 243 msec     Systemic VTI:  0.23 m MV E velocity: 87.00 cm/s   Systemic Diam: 2.00 cm MV A velocity: 102.00 cm/s MV E/A ratio:  0.85 Alexandria Angel MD Electronically signed by Alexandria Angel MD Signature Date/Time: 01/24/2024/4:48:40 PM    Final    CT ABDOMEN PELVIS W CONTRAST Result Date: 01/23/2024 CLINICAL DATA:  Nausea, vomiting, diarrhea, abdominal pain EXAM: CT ABDOMEN AND PELVIS WITH CONTRAST TECHNIQUE: Multidetector CT imaging of the abdomen and pelvis was performed using the standard protocol following bolus administration of intravenous contrast. RADIATION DOSE REDUCTION: This exam was performed according to the departmental dose-optimization program which includes automated exposure control, adjustment of the mA and/or kV according to patient size and/or use of iterative reconstruction technique. CONTRAST:  80mL OMNIPAQUE  IOHEXOL  350 MG/ML SOLN COMPARISON:  CT 10/19/2023 FINDINGS: Lower chest: 1.8 cm subpleural nodule in the right middle lobe and partially visualized 2.2 cm nodule in the lingula (series 3/image 1). 1.7 cm nodule in the right middle lobe (series 3/image 3). These of increased compared to CT chest 10/19/2023. Hepatobiliary: Increased size of the hypoattenuating lesions in the liver. For example in the right hepatic lobe medially the lesion measures 1.5 cm, previously 0.7 cm (series 2/image 20); and the 1.6 cm lesion in the left hepatic lobe (2/16) previously measured 0.7 cm. Gallbladder and biliary tree are unremarkable. Pancreas: Unremarkable. Spleen: Unremarkable. Adrenals/Urinary Tract: Stable adrenal glands. No  urinary calculi or hydronephrosis. Unremarkable bladder. Stomach/Bowel: Normal caliber large and small bowel. No bowel wall thickening. Stomach and appendix are within normal limits. Vascular/Lymphatic: Aortic atherosclerotic calcification. Unchanged left para-aortic lymph node measuring 1.7 x 1.1 cm (series 2/image 31). Reproductive: Increased distension of the endometrial canal with fluid now measuring 6.1 cm. Irregular nodular thickening and hyperenhancement surrounding the endometrial canal compatible with known carcinoma. No adnexal mass. Other: No free intraperitoneal fluid or air. Musculoskeletal: No acute fracture or destructive osseous lesion. Avascular necrosis right femoral head. IMPRESSION: 1. Progression of endometrial carcinoma since 10/19/2023 with increased distension of the endometrial canal now measuring 6.1 cm. 2. Worsening hepatic and pulmonary metastases. Similar left para-aortic lymphadenopathy. Aortic Atherosclerosis (ICD10-I70.0). Electronically Signed   By: Rozell Cornet M.D.   On: 01/23/2024 23:23    Assessment: *Acute liver injury/hepatitis *Abnormal LFTs *Liver metastases *Nausea and vomiting   Plan: Etiology of patient's acute liver injury/hepatitis unclear.     CT did show a few small liver mets which have increased in size slightly though still rather small.   Patient's family reports patient has had decreased appetite with poor oral intake.in addition to her intractable nausea and vomiting, possible dehydration/ischemic hepatopathy.   Ultrasound with Doppler completed this morning.  Results pending.  Echocardiogram 01/24/2024 largely unremarkable.   Further viral hepatitis tests pending  including HCV RNA, HSV, autoimmune markers, EBV, CMV  LFTs/coagulopathy worsening today.  Continue to monitor.   Discussed case with oncology Dr. Orvis Blare, no reported cases of acute liver failure from tisotumab vedotin .  Agree with IV NAC for possible nonacetaminophen induced  DILI x72 hours.   I discussed case further with transplant hepatology, Duey Ghent NP.  Recommended empiric valganciclovir until viral serologies have resulted.  Also recommended starting steroids, will start on prednisolone  60 mg daily.  Ideally would be able to perform liver biopsy.  Will discuss with hospitalist.  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 That being said, if she develops encephalopathy, she would likely be better served at a larger center for management.   Counseled on absolute alcohol cessation going forward.     Otherwise supportive care.   GI to continue to follow.   Rolando Cliche. Mordechai April, D.O. Gastroenterology and Hepatology [facility]   LOS: 1 day    01/25/2024, 12:28 PM

## 2024-01-26 DIAGNOSIS — K7200 Acute and subacute hepatic failure without coma: Secondary | ICD-10-CM | POA: Diagnosis not present

## 2024-01-26 DIAGNOSIS — R112 Nausea with vomiting, unspecified: Secondary | ICD-10-CM | POA: Diagnosis not present

## 2024-01-26 DIAGNOSIS — R7401 Elevation of levels of liver transaminase levels: Secondary | ICD-10-CM | POA: Diagnosis not present

## 2024-01-26 LAB — COMPREHENSIVE METABOLIC PANEL WITH GFR
ALT: 1253 U/L — ABNORMAL HIGH (ref 0–44)
AST: 1009 U/L — ABNORMAL HIGH (ref 15–41)
Albumin: 2.8 g/dL — ABNORMAL LOW (ref 3.5–5.0)
Alkaline Phosphatase: 107 U/L (ref 38–126)
Anion gap: 6 (ref 5–15)
BUN: 9 mg/dL (ref 6–20)
CO2: 22 mmol/L (ref 22–32)
Calcium: 8.9 mg/dL (ref 8.9–10.3)
Chloride: 106 mmol/L (ref 98–111)
Creatinine, Ser: 0.67 mg/dL (ref 0.44–1.00)
GFR, Estimated: >60 mL/min (ref >60)
Glucose, Bld: 149 mg/dL — ABNORMAL HIGH (ref 70–99)
Potassium: 4.1 mmol/L (ref 3.5–5.1)
Sodium: 134 mmol/L — ABNORMAL LOW (ref 135–145)
Total Bilirubin: 1.7 mg/dL — ABNORMAL HIGH (ref 0.0–1.2)
Total Protein: 6.4 g/dL — ABNORMAL LOW (ref 6.5–8.1)

## 2024-01-26 LAB — MAGNESIUM: Magnesium: 1.8 mg/dL (ref 1.7–2.4)

## 2024-01-26 LAB — PROTIME-INR
INR: 1.4 — ABNORMAL HIGH (ref 0.8–1.2)
Prothrombin Time: 17.4 s — ABNORMAL HIGH (ref 11.4–15.2)

## 2024-01-26 LAB — CMV DNA, QUANTITATIVE, PCR
CMV DNA Quant: NEGATIVE [IU]/mL
Log10 CMV Qn DNA Pl: UNDETERMINED {Log_IU}/mL

## 2024-01-26 LAB — AMMONIA: Ammonia: 42 umol/L — ABNORMAL HIGH (ref 9–35)

## 2024-01-26 MED ORDER — MAGNESIUM CITRATE PO SOLN
1.0000 | Freq: Once | ORAL | Status: AC
  Administered 2024-01-27: 1 via ORAL
  Filled 2024-01-26: qty 296

## 2024-01-26 MED ORDER — AMLODIPINE BESYLATE 5 MG PO TABS
10.0000 mg | ORAL_TABLET | Freq: Every day | ORAL | Status: DC
  Administered 2024-01-26 – 2024-01-27 (×2): 10 mg via ORAL
  Filled 2024-01-26 (×2): qty 2

## 2024-01-26 NOTE — Plan of Care (Signed)

## 2024-01-26 NOTE — Progress Notes (Signed)
 PROGRESS NOTE   Kristin Carson  UJW:[id number] DOB: Nov 10, 1970 DOA: 01/23/2024 PCP: Tobi Fortes, MD   Chief Complaint  Patient presents with   Nausea   Emesis   Level of care: Telemetry  Brief Admission History:  53 y.o. 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  Reports onset Monday 5/19 but worsening especially over the past few days and now unable to tolerate any p.o.'s.  Emesis appears clear.  No hematemesis/coffee-ground emesis, hematochezia, melena.  No associated abdominal pain.  Mild diarrhea.  No fever, chills.  Does drink alcohol about 2 standard drinks per day but none in the past week.  Takes Tylenol  with maximum of 8 extra strength Tylenol  per day, denies other OTC medications containing Tylenol .  She was admitted with rising LFTs and concern for DILI and after discussion with GI attending started on NAC.      Assessment and Plan:  Acute Liver Injury - IMPROVING  Transaminitis - IMPROVING  Question of DILI  Question of Shock Liver  -- appreciate GI team consult and recommendations -- pt last received Tivdak  (tisotumab vedotin ) for chemotherapy treatment in April 2025 -- LFTs and PT/INR improved after starting prednisolone  and valganciclovir on 5/26,   -- discussed with Dr. Mordechai April who reached out to hepatology; started prednisolone  and valganciclovir on 5/26.  He discussed with hepatology again today and with the rapid improvement in LFTs the recommendation was to DC valganciclovir and continue prednisolone  taper 60 mg daily x 5, then taper by 10 mg every 5 days.  -- continue supportive therapies -- continue 72 hours infusion of NAC - which should complete on 5/28 in the early morning -- GI team feel her presentation now more consistent with shock liver.    -- discussed with Dr Mordechai April, patient could go home tomorrow 5/28 if continues to improve.    Advancing metastatic cervical cancer  -- CT abd/pelvis demonstrated progressive metastases in liver and lungs -- Dr. Mordechai April conferenced with oncologist on call Dr. Orvis Blare  Nausea and vomiting - RESOLVED  -- improved with supportive care -- patient currently tolerating a regular diet   Essential HTN  -- BPs initially were soft, but after IV fluid hydration BP rebounding today -- restarting home amlodipine  10 mg   COPD  -- no signs or symptoms of exacerbation  -- bronchodilators PRN   Hyponatremia -- sodium improved to 134 -- following  Macrocytic Anemia -- Hg down to 10 from 12 likely from hemodilution  -- check CBC in AM   Thrombocytopenia  -- check CBC in AM   -- no active bleeding, follow  -- pt not on any heparin  products  Hypokalemia -- repleted    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 Mordechai April, likely can discharge home on 5/28 after completing NAC     Consultants:  GI  Procedures:   Antimicrobials:    Subjective: Pt reports that overall she is feeling better, she is tolerating diet and no abdominal pain.   Objective: Vitals:   01/26/24 0837 01/26/24 0900 01/26/24 1000 01/26/24 1135  BP:   (!) 147/128 (!) 144/77  Pulse:    80  Resp:  17 15  16  Temp:    98 F (36.7 C)  TempSrc:    Oral  SpO2: 98%   93%  Weight:      Height:        Intake/Output Summary (Last 24 hours) at 01/26/2024 1146 Last data filed at 01/25/2024 2209 Gross per 24 hour  Intake 3 ml  Output --  Net 3 ml   Filed Weights   01/23/24 2000  Weight: 72.6 kg   Examination:  General exam: Appears calm and comfortable  Respiratory system: Clear to auscultation. Respiratory effort  normal. Cardiovascular system: normal S1 & S2 heard. No JVD, murmurs, rubs, gallops or clicks. No pedal edema. Gastrointestinal system: Abdomen is nondistended, soft and nontender. No organomegaly or masses felt. Normal bowel sounds heard. Central nervous system: Alert and oriented. No focal neurological deficits. Extremities: Symmetric 5 x 5 power. Skin: No rashes, lesions or ulcers. Psychiatry: Judgement and insight appear normal. Mood & affect appropriate.   Data Reviewed: I have personally reviewed following labs and imaging studies  CBC: Recent Labs  Lab 01/23/24 2114 01/24/24 0106 01/25/24 0311  WBC 9.8 8.4 7.0  NEUTROABS 7.9*  --   --   HGB 12.2 12.0 10.2*  HCT 35.7* 35.9* 30.2*  MCV 102.9* 103.2* 102.4*  PLT 169 156 95*    Basic Metabolic Panel: Recent Labs  Lab 01/23/24 2114 01/24/24 0106 01/25/24 0311 01/26/24 0438  NA 133* 133* 130* 134*  K 3.6 4.1 3.2* 4.1  CL 103 105 101 106  CO2 19* 20* 23 22  GLUCOSE 78 72 103* 149*  BUN 14 14 10 9   CREATININE 0.95 0.80 0.81 0.67  CALCIUM  8.9 8.3* 8.4* 8.9  MG  --  1.4* 1.9 1.8  PHOS  --  4.0  --   --     CBG: No results for input(s): "GLUCAP" in the last 168 hours.  Recent Results (from the past 240 hours)  MRSA Next Gen by PCR, Nasal     Status: Abnormal   Collection Time: 01/25/24  5:26 AM   Specimen: Nasal Mucosa; Nasal Swab  Result Value Ref Range Status   MRSA by PCR Next Gen NONE DETECTED (A) NOT DETECTED Final    Comment:        The GeneXpert MRSA Assay (FDA approved for NASAL specimens only), is one component of a comprehensive MRSA colonization surveillance program. It is not intended to diagnose MRSA infection nor to guide or monitor treatment for MRSA infections. Performed at [facility], [address] [place], [place] [address]      Radiology Studies: US  LIVER DOPPLER Result Date: 01/25/2024 CLINICAL DATA:  Abnormal CT.  History endometrial carcinoma EXAM: DUPLEX ULTRASOUND OF LIVER  TECHNIQUE: Color and duplex Doppler ultrasound was performed to evaluate the hepatic in-flow and out-flow vessels. COMPARISON:  None Available. FINDINGS: Liver: Homogeneous hepatic parenchyma. The liver lesion seen on prior CT scan are not well defined on this ultrasound Doppler evaluation. Gallbladder is mildly distended. No shadowing stones. Slight gallbladder wall thickening of 3 mm. Nonspecific. Common duct measures 3 mm. Main Portal Vein size: 1.0 cm Portal Vein Velocities Main Prox:  31.9 cm/sec Main Mid: 19.6 cm/sec Main Dist:  22.8 cm/sec Right: 20.7 cm/sec Left: 27.7 cm/sec Hepatic Vein Velocities Right:  29.6 cm/sec Middle:  38.2 cm/sec Left:  32.6 cm/sec IVC: Present and patent with normal respiratory phasicity. Hepatic Artery Velocity:  210.5 cm/sec Splenic Vein Velocity:  16.6 cm/sec Spleen: 10.4 cm x 3.5 cm x 3.1 cm  with a total volume of 59.7 cm^3 (411 cm^3 is upper limit normal) Portal Vein Occlusion/Thrombus: No Splenic Vein Occlusion/Thrombus: No Ascites: None Varices: None IMPRESSION: Preserved hepatic vasculature.  No portal vein thrombosis. The liver lesions seen on CT scan not appreciated on this examination. Please correlate with prior CT Electronically Signed   By: Adrianna Horde M.D.   On: 01/25/2024 13:10   ECHOCARDIOGRAM COMPLETE Result Date: 01/24/2024    ECHOCARDIOGRAM REPORT   Patient Name:   PARVEEN FREEHLING Date of Exam: 01/24/2024 Medical Rec #:  [id number]    Height:       65.0 in Accession #:    [id number]   Weight:       160.0 lb Date of Birth:  09-27-1970     BSA:          1.799 m Patient Age:    53 years     BP:           141/66 mmHg Patient Gender: F            HR:           97 bpm. Exam [name] Procedure: 2D Echo, Color Doppler and Cardiac Doppler (Both Spectral and Color            Flow Doppler were utilized during procedure). Indications:    Dyspnea  History:        Patient has no prior history of Echocardiogram examinations.                 CHF, COPD; Risk  Factors:Hypertension.  Sonographer:    Astrid Blamer Referring Phys: [phone] Christoher Drudge L Kailana Benninger IMPRESSIONS  1. Left ventricular ejection fraction, by estimation, is 60 to 65%. The left ventricle has normal function. The left ventricle has no regional wall motion abnormalities. Left ventricular diastolic parameters are consistent with Grade I diastolic dysfunction (impaired relaxation).  2. Right ventricular systolic function is normal. The right ventricular size is normal.  3. The mitral valve is normal in structure. Trivial mitral valve regurgitation. No evidence of mitral stenosis.  4. The aortic valve has an indeterminant number of cusps. Aortic valve regurgitation is mild. No aortic stenosis is present. Comparison(s): No prior Echocardiogram. FINDINGS  Left Ventricle: Left ventricular ejection fraction, by estimation, is 60 to 65%. The left ventricle has normal function. The left ventricle has no regional wall motion abnormalities. The left ventricular internal cavity size was normal in size. There is  no left ventricular hypertrophy. Left ventricular diastolic parameters are consistent with Grade I diastolic dysfunction (impaired relaxation). Right Ventricle: The right ventricular size is normal. Right ventricular systolic function is normal. Left Atrium: Left atrial size was normal in size. Right Atrium: Right atrial size was normal in size. Pericardium: There is no evidence of pericardial effusion. Mitral Valve: The mitral valve is normal in structure. Mild mitral annular calcification. Trivial mitral valve regurgitation. No evidence of mitral valve stenosis. Tricuspid Valve: The tricuspid valve is normal in structure. Tricuspid valve regurgitation is not demonstrated. No evidence of tricuspid stenosis. Aortic Valve: The aortic valve has an indeterminant number of cusps. Aortic valve regurgitation is mild. Aortic regurgitation PHT measures 787 msec. No aortic stenosis is present. Aortic valve mean gradient  measures 5.0 mmHg. Aortic valve peak gradient measures 9.2 mmHg. Aortic valve area, by VTI measures 2.87 cm. Pulmonic Valve: The pulmonic valve was normal in structure. Pulmonic valve regurgitation is not visualized. No evidence of pulmonic stenosis. Aorta: The aortic root  is normal in size and structure. Venous: The inferior vena cava was not well visualized. IAS/Shunts: No atrial level shunt detected by color flow Doppler.  LEFT VENTRICLE PLAX 2D LVIDd:         4.60 cm   Diastology LVIDs:         3.00 cm   LV e' medial:    6.09 cm/s LV PW:         1.10 cm   LV E/e' medial:  14.3 LV IVS:        1.10 cm   LV e' lateral:   7.72 cm/s LVOT diam:     2.00 cm   LV E/e' lateral: 11.3 LV SV:         73 LV SV Index:   41 LVOT Area:     3.14 cm  RIGHT VENTRICLE RV S prime:     12.40 cm/s TAPSE (M-mode): 2.1 cm LEFT ATRIUM             Index        RIGHT ATRIUM          Index LA Vol (A2C):   43.3 ml 24.07 ml/m  RA Area:     8.56 cm LA Vol (A4C):   37.8 ml 21.01 ml/m  RA Volume:   13.70 ml 7.61 ml/m LA Biplane Vol: 43.3 ml 24.07 ml/m  AORTIC VALVE AV Area (Vmax):    2.65 cm AV Area (Vmean):   2.67 cm AV Area (VTI):     2.87 cm AV Vmax:           152.00 cm/s AV Vmean:          107.000 cm/s AV VTI:            0.254 m AV Peak Grad:      9.2 mmHg AV Mean Grad:      5.0 mmHg LVOT Vmax:         128.00 cm/s LVOT Vmean:        91.100 cm/s LVOT VTI:          0.232 m LVOT/AV VTI ratio: 0.91 AI PHT:            787 msec  AORTA Ao Root diam: 2.80 cm MITRAL VALVE MV Area (PHT): 3.12 cm     SHUNTS MV Decel Time: 243 msec     Systemic VTI:  0.23 m MV E velocity: 87.00 cm/s   Systemic Diam: 2.00 cm MV A velocity: 102.00 cm/s MV E/A ratio:  0.85 Alexandria Angel MD Electronically signed by Alexandria Angel MD Signature Date/Time: 01/24/2024/4:48:40 PM    Final     Scheduled Meds:  artificial tears   Right Eye QHS   Chlorhexidine  Gluconate Cloth  6 each Topical Daily   fluticasone furoate-vilanterol  1 puff Inhalation Daily    pantoprazole   40 mg Oral Daily   prednisoLONE  acetate  1 drop Right Eye BID   prednisoLONE   60 mg Oral Daily   pregabalin   200 mg Oral TID   sodium chloride  flush  3 mL Intravenous Q12H   trimethoprim -polymyxin b  1 drop Right Eye Q6H   Continuous Infusions:  acetylcysteine 6.25 mg/kg/hr (01/25/24 0309)   lactated ringers  100 mL/hr at 01/25/24 0309     LOS: 2 days   Time spent: 57 mins  Jontae Sonier Lincoln Renshaw, MD How to contact the [facility] Attending or Consulting provider 7A - 7P or covering provider during after hours 7P -7A, for this patient?  Check  the care team in [employer] and look for a) attending/consulting [facility] provider listed and b) the [facility] team listed Log into [email or web address] to find provider on call.  Locate the [facility] provider you are looking for under [facility] and page to a number that you can be directly reached. If you still have difficulty reaching the provider, please page the [facility] (Director on Call) for the Hospitalists listed on amion for assistance.  01/26/2024, 11:46 AM

## 2024-01-26 NOTE — Progress Notes (Signed)
 Subjective: Reports she is feeling well. No nausea, vomiting, abdominal pain, BRBPR, melena. Tolerating diet well. No mental status changes/confusion.   Objective: Vital signs in last 24 hours: Temp:  [97.9 F (36.6 C)-99 F (37.2 C)] 97.9 F (36.6 C) (05/27 0748) Pulse Rate:  [83-95] 94 (05/26 2200) Resp:  [12-31] 15 (05/27 0600) BP: (78-141)/(41-88) 136/78 (05/27 0600) SpO2:  [90 %-100 %] 100 % (05/27 0600) Last BM Date :  (PTA) General:   Alert and oriented, pleasant Head:  Normocephalic and atraumatic. Eyes:  No icterus, sclera clear. Conjuctiva pink.  Abdomen:  Bowel sounds present, soft, non-tender, non-distended. No HSM or hernias noted. No rebound or guarding. No masses appreciated  Msk:  Symmetrical without gross deformities. Normal posture. Extremities:  Without edema. Neurologic:  Alert and  oriented x4;  grossly normal neurologically. Psych:  Normal mood and affect.  Intake/Output from previous day: 05/26 0701 - 05/27 0700 In: 3 [I.V.:3] Out: -  Intake/Output this shift: No intake/output data recorded.  Lab Results: Recent Labs    01/23/24 2114 01/24/24 0106 01/25/24 0311  WBC 9.8 8.4 7.0  HGB 12.2 12.0 10.2*  HCT 35.7* 35.9* 30.2*  PLT 169 156 95*   BMET Recent Labs    01/24/24 0106 01/25/24 0311 01/26/24 0438  NA 133* 130* 134*  K 4.1 3.2* 4.1  CL 105 101 106  CO2 20* 23 22  GLUCOSE 72 103* 149*  BUN 14 10 9   CREATININE 0.80 0.81 0.67  CALCIUM  8.3* 8.4* 8.9   LFT Recent Labs    01/24/24 0106 01/25/24 0311 01/26/24 0438  PROT 6.4* 5.3* 6.4*  ALBUMIN 2.9* 2.3* 2.8*  AST 3,233* 4,223* 1,009*  ALT 1,012* 1,764* 1,253*  ALKPHOS 84 80 107  BILITOT 1.9* 2.0* 1.7*  BILIDIR 1.0*  --   --   IBILI 0.9  --   --    PT/INR Recent Labs    01/25/24 0311 01/26/24 0438  LABPROT 27.2* 17.4*  INR 2.5* 1.4*   Hepatitis Panel Recent Labs    01/24/24 0350  HEPBSAG NON REACTIVE     Studies/Results: US  LIVER DOPPLER Result Date:  01/25/2024 CLINICAL DATA:  Abnormal CT.  History endometrial carcinoma EXAM: DUPLEX ULTRASOUND OF LIVER TECHNIQUE: Color and duplex Doppler ultrasound was performed to evaluate the hepatic in-flow and out-flow vessels. COMPARISON:  None Available. FINDINGS: Liver: Homogeneous hepatic parenchyma. The liver lesion seen on prior CT scan are not well defined on this ultrasound Doppler evaluation. Gallbladder is mildly distended. No shadowing stones. Slight gallbladder wall thickening of 3 mm. Nonspecific. Common duct measures 3 mm. Main Portal Vein size: 1.0 cm Portal Vein Velocities Main Prox:  31.9 cm/sec Main Mid: 19.6 cm/sec Main Dist:  22.8 cm/sec Right: 20.7 cm/sec Left: 27.7 cm/sec Hepatic Vein Velocities Right:  29.6 cm/sec Middle:  38.2 cm/sec Left:  32.6 cm/sec IVC: Present and patent with normal respiratory phasicity. Hepatic Artery Velocity:  210.5 cm/sec Splenic Vein Velocity:  16.6 cm/sec Spleen: 10.4 cm x 3.5 cm x 3.1 cm with a total volume of 59.7 cm^3 (411 cm^3 is upper limit normal) Portal Vein Occlusion/Thrombus: No Splenic Vein Occlusion/Thrombus: No Ascites: None Varices: None IMPRESSION: Preserved hepatic vasculature.  No portal vein thrombosis. The liver lesions seen on CT scan not appreciated on this examination. Please correlate with prior CT Electronically Signed   By: Adrianna Horde M.D.   On: 01/25/2024 13:10   ECHOCARDIOGRAM COMPLETE Result Date: 01/24/2024    ECHOCARDIOGRAM REPORT   Patient Name:  Tressia Fry Date of Exam: 01/24/2024 Medical Rec #:  [id number]    Height:       65.0 in Accession #:    [id number]   Weight:       160.0 lb Date of Birth:  04-Dec-1970     BSA:          1.799 m Patient Age:    53 years     BP:           141/66 mmHg Patient Gender: F            HR:           97 bpm. Exam Location:  [name] Procedure: 2D Echo, Color Doppler and Cardiac Doppler (Both Spectral and Color            Flow Doppler were utilized during procedure). Indications:    Dyspnea  History:         Patient has no prior history of Echocardiogram examinations.                 CHF, COPD; Risk Factors:Hypertension.  Sonographer:    Astrid Blamer Referring Phys: [phone] CLANFORD L JOHNSON IMPRESSIONS  1. Left ventricular ejection fraction, by estimation, is 60 to 65%. The left ventricle has normal function. The left ventricle has no regional wall motion abnormalities. Left ventricular diastolic parameters are consistent with Grade I diastolic dysfunction (impaired relaxation).  2. Right ventricular systolic function is normal. The right ventricular size is normal.  3. The mitral valve is normal in structure. Trivial mitral valve regurgitation. No evidence of mitral stenosis.  4. The aortic valve has an indeterminant number of cusps. Aortic valve regurgitation is mild. No aortic stenosis is present. Comparison(s): No prior Echocardiogram. FINDINGS  Left Ventricle: Left ventricular ejection fraction, by estimation, is 60 to 65%. The left ventricle has normal function. The left ventricle has no regional wall motion abnormalities. The left ventricular internal cavity size was normal in size. There is  no left ventricular hypertrophy. Left ventricular diastolic parameters are consistent with Grade I diastolic dysfunction (impaired relaxation). Right Ventricle: The right ventricular size is normal. Right ventricular systolic function is normal. Left Atrium: Left atrial size was normal in size. Right [facility]: Right atrial size was normal in size. Pericardium: There is no evidence of pericardial effusion. Mitral Valve: The mitral valve is normal in structure. Mild mitral annular calcification. Trivial mitral valve regurgitation. No evidence of mitral valve stenosis. Tricuspid Valve: The tricuspid valve is normal in structure. Tricuspid valve regurgitation is not demonstrated. No evidence of tricuspid stenosis. Aortic Valve: The aortic valve has an indeterminant number of cusps. Aortic valve regurgitation is mild. Aortic  regurgitation PHT measures 787 msec. No aortic stenosis is present. Aortic valve mean gradient measures 5.0 mmHg. Aortic valve peak gradient measures 9.2 mmHg. Aortic valve area, by VTI measures 2.87 cm. Pulmonic Valve: The pulmonic valve was normal in structure. Pulmonic valve regurgitation is not visualized. No evidence of pulmonic stenosis. Aorta: The aortic root is normal in size and structure. Venous: The inferior vena cava was not well visualized. IAS/Shunts: No atrial level shunt detected by color flow Doppler.  LEFT VENTRICLE PLAX 2D LVIDd:         4.60 cm   Diastology LVIDs:         3.00 cm   LV e' medial:    6.09 cm/s LV PW:         1.10 cm   LV E/e' medial:  14.3 LV IVS:        1.10 cm   LV e' lateral:   7.72 cm/s LVOT diam:     2.00 cm   LV E/e' lateral: 11.3 LV SV:         73 LV SV Index:   41 LVOT Area:     3.14 cm  RIGHT VENTRICLE RV S prime:     12.40 cm/s TAPSE (M-mode): 2.1 cm LEFT ATRIUM             Index        RIGHT ATRIUM          Index LA Vol (A2C):   43.3 ml 24.07 ml/m  RA Area:     8.56 cm LA Vol (A4C):   37.8 ml 21.01 ml/m  RA Volume:   13.70 ml 7.61 ml/m LA Biplane Vol: 43.3 ml 24.07 ml/m  AORTIC VALVE AV Area (Vmax):    2.65 cm AV Area (Vmean):   2.67 cm AV Area (VTI):     2.87 cm AV Vmax:           152.00 cm/s AV Vmean:          107.000 cm/s AV VTI:            0.254 m AV Peak Grad:      9.2 mmHg AV Mean Grad:      5.0 mmHg LVOT Vmax:         128.00 cm/s LVOT Vmean:        91.100 cm/s LVOT VTI:          0.232 m LVOT/AV VTI ratio: 0.91 AI PHT:            787 msec  AORTA Ao Root diam: 2.80 cm MITRAL VALVE MV Area (PHT): 3.12 cm     SHUNTS MV Decel Time: 243 msec     Systemic VTI:  0.23 m MV E velocity: 87.00 cm/s   Systemic Diam: 2.00 cm MV A velocity: 102.00 cm/s MV E/A ratio:  0.85 Alexandria Angel MD Electronically signed by Alexandria Angel MD Signature Date/Time: 01/24/2024/4:48:40 PM    Final     Assessment: 53 year old female with 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  She was ultimately admitted for acutely elevated LFTs with GI consulted for further management.  Acute transaminitis: - LFTs previously normal in the outpatient setting.  On admission, AST 2520, ALT 855, total bilirubin 1.9.   - CT A/P with contrast showed increased size of hypoattenuating lesions in the liver though still remaining fairly small, max at 1.6 cm.  Also with progression of endometrial carcinoma. -Liver doppler with patent hepatic vasculature. - Echocardiogram 01/24/2024 largely unremarkable.  - IV NAC started 01/24/2024 for possible nonacetaminophen induced DILI x72 hours (will complete tomorrow) - Started on valganciclovir (until viral serologies resulted) and prednisolone  60 mg daily on 5/26 after discussion with Duey Ghent, NP with [facility].   - HCVRNA not detected,  hep B surface antigen nonreactive - Multiple serologies still in process including HSV core antibody ANA, ASMA, immunoglobulins, EBV antibodies, HSV antibodies, CMV DNA.  - LFTs peaked on 01/25/2024 with AST 4223, ALT 1764, total bilirubin 2.0.  Today, AST 1009, ALT 1000 153, total bilirubin 1.7.  INR down to  1.4 today from peak of 2.5 yesterday.  - No symptoms of acute liver failure.  - Quite interesting that her LFTs improved significantly in the last 24 hours. Would not expect rapid response to valganciclovir, but can see rapid response to steroids.   - Etiology remains unclear.  Considering rapid improvement in LFTs, presentation seems most consistent with shock liver (in the setting of dehydration). Also consider DILI though unclear source as no reported cases of acute liver failure from tisotumab vedotin . Underlying  autoimmune hepatitis also in differential.   - Case again discussed with Duey Ghent, NP this morning who recommended stopping valganciclovir and tapering prednisone . If LFTs start to increase, would suggest steroids were helping and would need to consider liver biopsy.  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    N/V:  Resolved with supportive measures.  Last received antiemetics on 01/24/2024.  Tolerating diet well. Etiology unclear. Query symptoms related to chemo vs cannabis hyperemesis in the setting of frequent marijuana use.    Plan: Complete 72 hours of IV N-acetylcysteine.  This should be completed tomorrow. Stop valganciclovir. Continue prednisolone  60 mg daily for total of 5 days followed by taper of 10 mg every 5 days. Close monitoring of LFTs with prednisolone  taper.  If LFTs increase, will need to pursue liver biopsy. Continue to monitor LFTs and coags daily. Follow-up on pending serologies.  Avoid Tylenol  for now.  Counseled on importance of strict alcohol cessation. Counseled on the importance of avoiding marijuana and all illicit drugs.   LOS: 2 days    01/26/2024, 8:00 AM   Shana Daring, [facility]

## 2024-01-26 NOTE — Plan of Care (Signed)

## 2024-01-27 ENCOUNTER — Telehealth (INDEPENDENT_AMBULATORY_CARE_PROVIDER_SITE_OTHER): Payer: Self-pay | Admitting: Gastroenterology

## 2024-01-27 DIAGNOSIS — R112 Nausea with vomiting, unspecified: Secondary | ICD-10-CM | POA: Diagnosis not present

## 2024-01-27 DIAGNOSIS — R7401 Elevation of levels of liver transaminase levels: Secondary | ICD-10-CM | POA: Diagnosis not present

## 2024-01-27 DIAGNOSIS — K719 Toxic liver disease, unspecified: Secondary | ICD-10-CM | POA: Diagnosis not present

## 2024-01-27 DIAGNOSIS — R748 Abnormal levels of other serum enzymes: Secondary | ICD-10-CM | POA: Diagnosis not present

## 2024-01-27 DIAGNOSIS — J449 Chronic obstructive pulmonary disease, unspecified: Secondary | ICD-10-CM | POA: Diagnosis not present

## 2024-01-27 LAB — AMMONIA: Ammonia: 51 umol/L — ABNORMAL HIGH (ref 9–35)

## 2024-01-27 LAB — COMPREHENSIVE METABOLIC PANEL WITH GFR
ALT: 689 U/L — ABNORMAL HIGH (ref 0–44)
AST: 245 U/L — ABNORMAL HIGH (ref 15–41)
Albumin: 2.5 g/dL — ABNORMAL LOW (ref 3.5–5.0)
Alkaline Phosphatase: 97 U/L (ref 38–126)
Anion gap: 8 (ref 5–15)
BUN: 11 mg/dL (ref 6–20)
CO2: 24 mmol/L (ref 22–32)
Calcium: 9 mg/dL (ref 8.9–10.3)
Chloride: 104 mmol/L (ref 98–111)
Creatinine, Ser: 0.7 mg/dL (ref 0.44–1.00)
GFR, Estimated: >60 mL/min (ref >60)
Glucose, Bld: 149 mg/dL — ABNORMAL HIGH (ref 70–99)
Potassium: 3.1 mmol/L — ABNORMAL LOW (ref 3.5–5.1)
Sodium: 136 mmol/L (ref 135–145)
Total Bilirubin: 1.3 mg/dL — ABNORMAL HIGH (ref 0.0–1.2)
Total Protein: 5.9 g/dL — ABNORMAL LOW (ref 6.5–8.1)

## 2024-01-27 LAB — CBC
HCT: 28.4 % — ABNORMAL LOW (ref 36.0–46.0)
Hemoglobin: 10 g/dL — ABNORMAL LOW (ref 12.0–15.0)
MCH: 34.4 pg — ABNORMAL HIGH (ref 26.0–34.0)
MCHC: 35.2 g/dL (ref 30.0–36.0)
MCV: 97.6 fL (ref 80.0–100.0)
Platelets: 110 10*3/uL — ABNORMAL LOW (ref 150–400)
RBC: 2.91 MIL/uL — ABNORMAL LOW (ref 3.87–5.11)
RDW: 16.4 % — ABNORMAL HIGH (ref 11.5–15.5)
WBC: 11.3 10*3/uL — ABNORMAL HIGH (ref 4.0–10.5)
nRBC: 0 % (ref 0.0–0.2)

## 2024-01-27 LAB — ENA+DNA/DS+ANTICH+CENTRO+JO...
Anti JO-1: <0.2 AI (ref 0.0–0.9)
Centromere Ab Screen: <0.2 AI (ref 0.0–0.9)
Chromatin Ab SerPl-aCnc: <0.2 AI (ref 0.0–0.9)
ENA SM Ab Ser-aCnc: <0.2 AI (ref 0.0–0.9)
Ribonucleic Protein: 0.3 AI (ref 0.0–0.9)
SSA (Ro) (ENA) Antibody, IgG: >8 AI — ABNORMAL HIGH (ref 0.0–0.9)
SSB (La) (ENA) Antibody, IgG: <0.2 AI (ref 0.0–0.9)
Scleroderma (Scl-70) (ENA) Antibody, IgG: <0.2 AI (ref 0.0–0.9)
ds DNA Ab: <1 [IU]/mL (ref 0–9)

## 2024-01-27 LAB — PROTIME-INR
INR: 1.2 (ref 0.8–1.2)
Prothrombin Time: 15.5 s — ABNORMAL HIGH (ref 11.4–15.2)

## 2024-01-27 LAB — HSV(HERPES SIMPLEX VRS) I + II AB-IGG
HSV 1 Glycoprotein G Ab, IgG: NONREACTIVE
HSV 2 Glycoprotein G Ab, IgG: REACTIVE — AB

## 2024-01-27 LAB — EBV AB TO VIRAL CAPSID AG PNL, IGG+IGM
EBV VCA IgG: >600 U/mL — ABNORMAL HIGH (ref 0.0–17.9)
EBV VCA IgM: <36 U/mL (ref 0.0–35.9)

## 2024-01-27 LAB — HEPATITIS B CORE ANTIBODY, TOTAL: HEP B CORE AB: NEGATIVE

## 2024-01-27 LAB — ANA W/REFLEX IF POSITIVE: Anti Nuclear Antibody (ANA): POSITIVE — AB

## 2024-01-27 LAB — MAGNESIUM: Magnesium: 1.5 mg/dL — ABNORMAL LOW (ref 1.7–2.4)

## 2024-01-27 LAB — ANTI-SMOOTH MUSCLE ANTIBODY, IGG: F-Actin IgG: 8 U (ref 0–19)

## 2024-01-27 MED ORDER — POTASSIUM CHLORIDE CRYS ER 20 MEQ PO TBCR
40.0000 meq | EXTENDED_RELEASE_TABLET | Freq: Once | ORAL | Status: AC
  Administered 2024-01-27: 40 meq via ORAL
  Filled 2024-01-27: qty 2

## 2024-01-27 MED ORDER — PREDNISOLONE 5 MG PO TABS: 10.0000 mg | ORAL_TABLET | Freq: Every day | ORAL | 0 refills | Status: DC

## 2024-01-27 NOTE — Progress Notes (Addendum)
 Subjective: Feeling ok this morning. Reports some pain in her feet. No abdominal pain, nausea or vomiting. No BMs, states she was given something last night to help her go but still has not had a BM since admission.   Objective: Vital signs in last 24 hours: Temp:  [97.7 F (36.5 C)-98.2 F (36.8 C)] 97.7 F (36.5 C) (05/28 0549) Pulse Rate:  [80-103] 90 (05/28 0227) Resp:  [15-18] 18 (05/28 0549) BP: (137-153)/(73-128) 137/78 (05/28 0549) SpO2:  [93 %-100 %] 98 % (05/28 0732) Last BM Date : 01/22/24 General:   Alert and oriented, pleasant Head:  Normocephalic and atraumatic. Eyes:  No icterus, sclera clear. Conjuctiva pink.  Mouth:  Without lesions, mucosa pink and moist.  Heart:  S1, S2 present, no murmurs noted.  Lungs: Clear to auscultation bilaterally, without wheezing, rales, or rhonchi.  Abdomen:  Bowel sounds present, soft, non-tender, non-distended. No HSM or hernias noted. No rebound or guarding. No masses appreciated  Extremities:  Without clubbing or edema. Neurologic:  Alert and  oriented x4;  grossly normal neurologically. Skin:  Warm and dry, intact without significant lesions.  Psych:  Alert and cooperative. Normal mood and affect.  Intake/Output from previous day: 05/27 0701 - 05/28 0700 In: 4821.4 [P.O.:240; I.V.:4581.4] Out: -  Intake/Output this shift: No intake/output data recorded.  Lab Results: Recent Labs    01/25/24 0311 01/27/24 0427  WBC 7.0 11.3*  HGB 10.2* 10.0*  HCT 30.2* 28.4*  PLT 95* 110*   BMET Recent Labs    01/25/24 0311 01/26/24 0438 01/27/24 0427  NA 130* 134* 136  K 3.2* 4.1 3.1*  CL 101 106 104  CO2 23 22 24   GLUCOSE 103* 149* 149*  BUN 10 9 11   CREATININE 0.81 0.67 0.70  CALCIUM  8.4* 8.9 9.0   LFT Recent Labs    01/25/24 0311 01/26/24 0438 01/27/24 0427  PROT 5.3* 6.4* 5.9*  ALBUMIN 2.3* 2.8* 2.5*  AST 4,223* 1,009* 245*  ALT 1,764* 1,253* 689*  ALKPHOS 80 107 97  BILITOT 2.0* 1.7* 1.3*   PT/INR Recent  Labs    01/26/24 0438 01/27/24 0427  LABPROT 17.4* 15.5*  INR 1.4* 1.2    Assessment: Kristin Carson. is a 53 year old female with history of metastatic cervical cancer with mets to lung, liver, peritoneum, undergoing chemo in outpatient setting with Tivdak  last received 4/24, associated keratopathy, additional history COPD, hypertension who presented to the ED with persistent nausea, vomiting since 5/19, inability to tolerate p.o.'s.  Admitted for acutely elevated LFTs and GI consulted for further evaluation.  Acute transaminitis: -LFTs previously normal in outpatient setting, on admission AST 2520, ALT 855, total bilirubin 1.9 -CT A/P with contrast showed increased size of hypoattenuating lesions in the liver though still remaining fairly small, max 1.6 cm, also with progression of endometrial carcinoma -Liver Doppler with patent hepatic vasculature -Echocardiogram 01/24/2024 large unremarkable -IV NAC started 01/24/2024 for possible nonacetaminophen induced daily x 72 hours, stopped this a.m. -Started on valganciclovir and prednisolone  60 mg daily on 5/26 after discussion with Duey Ghent, NP with [facility] -HCVRNA not detected, hep B surface antigen nonreactive -Multiple serologies done: HSV1 Ab IgG non reactive, HSV2 AB IgG reactive, CMV negative -ASMA, EBV, immunoglobulins, ANA PENDING -LFTs peaked on 5/26 with AST 4023, ALT 1764, T. bili 2 -Etiology remains unclear, considering rapid improvement in LFTs, presentation seems most consistent with shock liver (in setting of dehydration).  Should also consider DILI though unclear source as no reported cases of acute  liver failure from current chemotherapy regimen -Underlying autoimmune hepatitis also in the differential, serologies pending, as above  -Case was again discussed yesterday with Duey Ghent, NP who recommended stopping valganciclovir and tapering prednisolone , however if LFTs start to increase, would suggest steroids were  helping and would need to consider liver biopsy -Unfortunately patient is not a transplant candidate due to metastatic endometrial cancer -LFTs today: AST 245, ALT 689, T bili 1.3, alk phos remains WNL, INR 1.2, Ammonia yesterday 42, no signs of HE today  Plan: -NAC stopped this am (completed 72 hrs therapy) -Continue prednisolone  60 mg daily for total of 5 days followed by taper of 10 mg every 5 days.  -If LFTs trend up on steroid taper, need to consider liver biopsy -Avoid tylenol  use for now -Strict alcohol cessation -Follow for pending serologies   GI will sign off, will need outpatient follow up with our team in 2-3 weeks which we will arrange     LOS: 3 days    01/27/2024, 8:42 AM   Rashmi Tallent L. Imaan Padgett, MSN, APRN, AGNP-C Adult-Gerontology Nurse Practitioner [facility] at [facility]

## 2024-01-27 NOTE — Discharge Summary (Signed)
 Physician Discharge Summary   Patient: Kristin Carson MRN: [id number] DOB: 02/03/1971  Admit date:     01/23/2024  Discharge date: 01/27/24  Discharge Physician: Myrtie Atkinson Diva Lemberger   PCP: Tobi Fortes, MD   Recommendations at discharge:   Please follow up with primary care provider within 1-2 weeks  Please repeat CMP  in one week     Hospital Course: 53 y.o. 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  Reports onset Monday 5/19 but worsening especially over the past few days and now unable to tolerate any p.o.'s.  Emesis appears clear.  No hematemesis/coffee-ground emesis, hematochezia, melena.  No associated abdominal pain.  Mild diarrhea.  No fever, chills.  Does drink alcohol about 2 standard drinks per day but none in the past week.  Takes Tylenol  with maximum of 8 extra strength Tylenol  per day, denies other OTC medications containing Tylenol .  She was admitted with rising LFTs and concern for DILI and after discussion with GI attending started on NAC.     Assessment and Plan: Acute Liver Injury - IMPROVING  Transaminitis - IMPROVING  Question of DILI initial --with rapid improvement, more likely due to ischemic liver injury due to n/v -- appreciate GI team consult and recommendations -- pt last received Tivdak  (tisotumab vedotin ) for chemotherapy treatment in April 2025 -- LFTs and PT/INR improved after starting prednisolone  and valganciclovir on 5/26,   -- discussed with Dr. Mordechai April who reached out to hepatology; started prednisolone  and valganciclovir on 5/26.  He discussed with hepatology again today and with the rapid improvement in LFTs the recommendation was to DC valganciclovir and continue prednisolone  taper 60 mg daily x 5, then taper by 10 mg every 5 days.  --  continue supportive therapies -- finished 72 hours infusion of NAC - which should complete on 5/28 in the early morning -- GI team feel her presentation now more consistent with shock liver.   -- discussed with Dr. Cecile Coder for d/c 5/28 with continued LFT improvement --Autoimmune panel is still pending.  GI will arrange office follow up Patient was tearful  as she stated being under significant stress and admitted she has been drinking alcohol recently to cope with stress.  Advised to stop   Advancing metastatic cervical cancer  -- CT abd/pelvis demonstrated progressive metastases in liver and lungs -- Dr. Mordechai April conferenced with oncologist on call Dr. Orvis Blare - out pt follow up with Dr. Cheree Cords   Nausea and vomiting - RESOLVED  -- improved with supportive care -- patient currently tolerating a regular diet    Essential HTN  -- BPs initially were soft, but after IV fluid hydration BP rebounding today -- restarting home amlodipine  10 mg    COPD  -- no signs or symptoms of exacerbation  -- bronchodilators PRN  -- stable on RA   Hyponatremia -- sodium improved to 136 on day of d/c -- due to volume depletion and poor solute intake   Macrocytic Anemia -- Hg down to 10 from 12 likely from hemodilution  -- Hgb 11.0 on day d/c    Thrombocytopenia  -- overall stable and improving -- no active bleeding, follow  -- pt not on any heparin  products   Hypokalemia -- repleted     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  -- resumed home eye drops and currently symptoms stable per patient  Consultants: GI Procedures performed: none  Disposition: Home Diet recommendation:  Cardiac diet DISCHARGE MEDICATION: Allergies as of 01/27/2024       Reactions   Carrot [daucus Carota] Hives   Lisinopril -hydrochlorothiazide     Oral swelling   Ibuprofen  Other (See Comments)   Oral swelling   Other Itching, Other (See Comments)   Hair dye, blisters, pus-filled, soreness   Tramadol   Hives   Erythromycin Hives        Medication List     STOP taking these medications    acetaminophen  500 MG tablet Commonly known as: TYLENOL    cetirizine  10 MG tablet Commonly known as: ZyrTEC  Allergy   dexamethasone  0.1 % ophthalmic suspension Commonly known as: DECADRON    megestrol  400 MG/10ML suspension Commonly known as: MEGACE    methocarbamol  500 MG tablet Commonly known as: ROBAXIN    methylPREDNISolone  4 MG Tbpk tablet Commonly known as: MEDROL  DOSEPAK   pantoprazole  40 MG tablet Commonly known as: Protonix    predniSONE  20 MG tablet Commonly known as: DELTASONE        TAKE these medications    amLODipine  10 MG tablet Commonly known as: NORVASC  Take 1 tablet (10 mg total) by mouth daily.   brimonidine  0.1 % Soln Commonly known as: ALPHAGAN  P Place 3 drops into both eyes immediately prior to start of Tivdak  infusion   budesonide -formoterol  160-4.5 MCG/ACT inhaler Commonly known as: Symbicort  Inhale 2 puffs into the lungs in the morning and at bedtime. What changed:  when to take this reasons to take this   Carboxymethylcellulose Sod PF 0.5 % Soln Place 2 drops into both eyes 4 (four) times daily. Continue for 30 days after last dose of Tivdak    levalbuterol  45 MCG/ACT inhaler Commonly known as: XOPENEX  HFA Inhale 2 puffs into the lungs every 8 (eight) hours as needed for wheezing or shortness of breath.   lidocaine -prilocaine  cream Commonly known as: EMLA  Apply a quarter-sized amount to port a cath site and cover with plastic wrap 1 hour prior to infusion appointments   magnesium  oxide 400 (240 Mg) MG tablet Commonly known as: MAG-OX Take 1 tablet (400 mg total) by mouth 3 (three) times daily.   oxyCODONE  5 MG immediate release tablet Commonly known as: Oxy IR/ROXICODONE  Take 1 tablet (5 mg total) by mouth every 6 (six) hours as needed for severe pain (pain score 7-10).   prednisoLONE  5 MG Tabs tablet Take 2 tablets (10 mg total) by mouth  daily. Decrease by 2 tablets every 5 days Start taking on: Jan 28, 2024   prednisoLONE  acetate 1 % ophthalmic suspension Commonly known as: PRED FORTE  Place 1 drop into both eyes 2 (two) times daily.   pregabalin  200 MG capsule Commonly known as: LYRICA  Take 1 capsule (200 mg total) by mouth 3 (three) times daily.        Discharge Exam: Filed Weights   01/23/24 2000  Weight: 72.6 kg   HEENT:  Olean/AT, No thrush, no icterus CV:  RRR, no rub, no S3, no S4 Lung:  diminished BS.  Bibasilar rales. No wheeze Abd:  soft/+BS, NT Ext:  No edema, no lymphangitis, no synovitis, no rash   Condition at discharge: stable  The results of significant diagnostics from this hospitalization (including imaging, microbiology, ancillary and laboratory) are listed below for reference.   Imaging Studies: US  LIVER DOPPLER Result Date: 01/25/2024 CLINICAL DATA:  Abnormal CT.  History endometrial carcinoma EXAM: DUPLEX ULTRASOUND OF LIVER TECHNIQUE: Color and duplex Doppler ultrasound was performed to evaluate the hepatic  in-flow and out-flow vessels. COMPARISON:  None Available. FINDINGS: Liver: Homogeneous hepatic parenchyma. The liver lesion seen on prior CT scan are not well defined on this ultrasound Doppler evaluation. Gallbladder is mildly distended. No shadowing stones. Slight gallbladder wall thickening of 3 mm. Nonspecific. Common duct measures 3 mm. Main Portal Vein size: 1.0 cm Portal Vein Velocities Main Prox:  31.9 cm/sec Main Mid: 19.6 cm/sec Main Dist:  22.8 cm/sec Right: 20.7 cm/sec Left: 27.7 cm/sec Hepatic Vein Velocities Right:  29.6 cm/sec Middle:  38.2 cm/sec Left:  32.6 cm/sec IVC: Present and patent with normal respiratory phasicity. Hepatic Artery Velocity:  210.5 cm/sec Splenic Vein Velocity:  16.6 cm/sec Spleen: 10.4 cm x 3.5 cm x 3.1 cm with a total volume of 59.7 cm^3 (411 cm^3 is upper limit normal) Portal Vein Occlusion/Thrombus: No Splenic Vein Occlusion/Thrombus: No Ascites: None  Varices: None IMPRESSION: Preserved hepatic vasculature.  No portal vein thrombosis. The liver lesions seen on CT scan not appreciated on this examination. Please correlate with prior CT Electronically Signed   By: Adrianna Horde M.D.   On: 01/25/2024 13:10   ECHOCARDIOGRAM COMPLETE Result Date: 01/24/2024    ECHOCARDIOGRAM REPORT   Patient Name:   Kristin Carson Date of Exam: 01/24/2024 Medical Rec #:  [id number]    Height:       65.0 in Accession #:    [id number]   Weight:       160.0 lb Date of Birth:  Mar 04, 1971     BSA:          1.799 m Patient Age:    53 years     BP:           141/66 mmHg Patient Gender: F            HR:           97 bpm. Exam Location:  [name] Procedure: 2D Echo, Color Doppler and Cardiac Doppler (Both Spectral and Color            Flow Doppler were utilized during procedure). Indications:    Dyspnea  History:        Patient has no prior history of Echocardiogram examinations.                 CHF, COPD; Risk Factors:Hypertension.  Sonographer:    Astrid Blamer Referring Phys: [phone] [place] L JOHNSON IMPRESSIONS  1. Left ventricular ejection fraction, by estimation, is 60 to 65%. The left ventricle has normal function. The left ventricle has no regional wall motion abnormalities. Left ventricular diastolic parameters are consistent with Grade I diastolic dysfunction (impaired relaxation).  2. Right ventricular systolic function is normal. The right ventricular size is normal.  3. The mitral valve is normal in structure. Trivial mitral valve regurgitation. No evidence of mitral stenosis.  4. The aortic valve has an indeterminant number of cusps. Aortic valve regurgitation is mild. No aortic stenosis is present. Comparison(s): No prior Echocardiogram. FINDINGS  Left Ventricle: Left ventricular ejection fraction, by estimation, is 60 to 65%. The left ventricle has normal function. The left ventricle has no regional wall motion abnormalities. The left ventricular internal cavity size was normal in  size. There is  no left ventricular hypertrophy. Left ventricular diastolic parameters are consistent with Grade I diastolic dysfunction (impaired relaxation). Right Ventricle: The right ventricular size is normal. Right ventricular systolic function is normal. Left Atrium: Left atrial size was normal in size. Right Atrium: Right atrial size was normal in size. Pericardium:  There is no evidence of pericardial effusion. Mitral Valve: The mitral valve is normal in structure. Mild mitral annular calcification. Trivial mitral valve regurgitation. No evidence of mitral valve stenosis. Tricuspid Valve: The tricuspid valve is normal in structure. Tricuspid valve regurgitation is not demonstrated. No evidence of tricuspid stenosis. Aortic Valve: The aortic valve has an indeterminant number of cusps. Aortic valve regurgitation is mild. Aortic regurgitation PHT measures 787 msec. No aortic stenosis is present. Aortic valve mean gradient measures 5.0 mmHg. Aortic valve peak gradient measures 9.2 mmHg. Aortic valve area, by VTI measures 2.87 cm. Pulmonic Valve: The pulmonic valve was normal in structure. Pulmonic valve regurgitation is not visualized. No evidence of pulmonic stenosis. Aorta: The aortic root is normal in size and structure. Venous: The inferior vena cava was not well visualized. IAS/Shunts: No atrial level shunt detected by color flow Doppler.  LEFT VENTRICLE PLAX 2D LVIDd:         4.60 cm   Diastology LVIDs:         3.00 cm   LV e' medial:    6.09 cm/s LV PW:         1.10 cm   LV E/e' medial:  14.3 LV IVS:        1.10 cm   LV e' lateral:   7.72 cm/s LVOT diam:     2.00 cm   LV E/e' lateral: 11.3 LV SV:         73 LV SV Index:   41 LVOT Area:     3.14 cm  RIGHT VENTRICLE RV S prime:     12.40 cm/s TAPSE (M-mode): 2.1 cm LEFT ATRIUM             Index        RIGHT ATRIUM          Index LA Vol (A2C):   43.3 ml 24.07 ml/m  RA Area:     8.56 cm LA Vol (A4C):   37.8 ml 21.01 ml/m  RA Volume:   13.70 ml 7.61 ml/m  LA Biplane Vol: 43.3 ml 24.07 ml/m  AORTIC VALVE AV Area (Vmax):    2.65 cm AV Area (Vmean):   2.67 cm AV Area (VTI):     2.87 cm AV Vmax:           152.00 cm/s AV Vmean:          107.000 cm/s AV VTI:            0.254 m AV Peak Grad:      9.2 mmHg AV Mean Grad:      5.0 mmHg LVOT Vmax:         128.00 cm/s LVOT Vmean:        91.100 cm/s LVOT VTI:          0.232 m LVOT/AV VTI ratio: 0.91 AI PHT:            787 msec  AORTA Ao Root diam: 2.80 cm MITRAL VALVE MV Area (PHT): 3.12 cm     SHUNTS MV Decel Time: 243 msec     Systemic VTI:  0.23 m MV E velocity: 87.00 cm/s   Systemic Diam: 2.00 cm MV A velocity: 102.00 cm/s MV E/A ratio:  0.85 Alexandria Angel MD Electronically signed by Alexandria Angel MD Signature Date/Time: 01/24/2024/4:48:40 PM    Final    CT ABDOMEN PELVIS W CONTRAST Result Date: 01/23/2024 CLINICAL DATA:  Nausea, vomiting, diarrhea, abdominal pain EXAM: CT ABDOMEN AND PELVIS WITH  CONTRAST TECHNIQUE: Multidetector CT imaging of the abdomen and pelvis was performed using the standard protocol following bolus administration of intravenous contrast. RADIATION DOSE REDUCTION: This exam was performed according to the departmental dose-optimization program which includes automated exposure control, adjustment of the mA and/or kV according to patient size and/or use of iterative reconstruction technique. CONTRAST:  80mL OMNIPAQUE  IOHEXOL  350 MG/ML SOLN COMPARISON:  CT 10/19/2023 FINDINGS: Lower chest: 1.8 cm subpleural nodule in the right middle lobe and partially visualized 2.2 cm nodule in the lingula (series 3/image 1). 1.7 cm nodule in the right middle lobe (series 3/image 3). These of increased compared to CT chest 10/19/2023. Hepatobiliary: Increased size of the hypoattenuating lesions in the liver. For example in the right hepatic lobe medially the lesion measures 1.5 cm, previously 0.7 cm (series 2/image 20); and the 1.6 cm lesion in the left hepatic lobe (2/16) previously measured 0.7 cm.  Gallbladder and biliary tree are unremarkable. Pancreas: Unremarkable. Spleen: Unremarkable. Adrenals/Urinary Tract: Stable adrenal glands. No urinary calculi or hydronephrosis. Unremarkable bladder. Stomach/Bowel: Normal caliber large and small bowel. No bowel wall thickening. Stomach and appendix are within normal limits. Vascular/Lymphatic: Aortic atherosclerotic calcification. Unchanged left para-aortic lymph node measuring 1.7 x 1.1 cm (series 2/image 31). Reproductive: Increased distension of the endometrial canal with fluid now measuring 6.1 cm. Irregular nodular thickening and hyperenhancement surrounding the endometrial canal compatible with known carcinoma. No adnexal mass. Other: No free intraperitoneal fluid or air. Musculoskeletal: No acute fracture or destructive osseous lesion. Avascular necrosis right femoral head. IMPRESSION: 1. Progression of endometrial carcinoma since 10/19/2023 with increased distension of the endometrial canal now measuring 6.1 cm. 2. Worsening hepatic and pulmonary metastases. Similar left para-aortic lymphadenopathy. Aortic Atherosclerosis (ICD10-I70.0). Electronically Signed   By: Rozell Cornet M.D.   On: 01/23/2024 23:23    Microbiology: Results for orders placed or performed during the hospital encounter of 01/23/24  MRSA Next Gen by PCR, Nasal     Status: Abnormal   Collection Time: 01/25/24  5:26 AM   Specimen: Nasal Mucosa; Nasal Swab  Result Value Ref Range Status   MRSA by PCR Next Gen NONE DETECTED (A) NOT DETECTED Final    Comment:        The GeneXpert MRSA Assay (FDA approved for NASAL specimens only), is one component of a comprehensive MRSA colonization surveillance program. It is not intended to diagnose MRSA infection nor to guide or monitor treatment for MRSA infections. Performed at [facility], [address] [place], [place] [address]     Labs: CBC: Recent Labs  Lab 01/23/24 2114 01/24/24 0106 01/25/24 0311 01/27/24 0427   WBC 9.8 8.4 7.0 11.3*  NEUTROABS 7.9*  --   --   --   HGB 12.2 12.0 10.2* 10.0*  HCT 35.7* 35.9* 30.2* 28.4*  MCV 102.9* 103.2* 102.4* 97.6  PLT 169 156 95* 110*   Basic Metabolic Panel: Recent Labs  Lab 01/23/24 2114 01/24/24 0106 01/25/24 0311 01/26/24 0438 01/27/24 0427  NA 133* 133* 130* 134* 136  K 3.6 4.1 3.2* 4.1 3.1*  CL 103 105 101 106 104  CO2 19* 20* 23 22 24   GLUCOSE 78 72 103* 149* 149*  BUN 14 14 10 9 11   CREATININE 0.95 0.80 0.81 0.67 0.70  CALCIUM  8.9 8.3* 8.4* 8.9 9.0  MG  --  1.4* 1.9 1.8 1.5*  PHOS  --  4.0  --   --   --    Liver Function Tests: Recent Labs  Lab 01/23/24  2114 01/24/24 0106 01/25/24 0311 01/26/24 0438 01/27/24 0427  AST 2,520* 3,233* 4,223* 1,009* 245*  ALT 855* 1,012* 1,764* 1,253* 689*  ALKPHOS 89 84 80 107 97  BILITOT 1.9* 1.9* 2.0* 1.7* 1.3*  PROT 6.8 6.4* 5.3* 6.4* 5.9*  ALBUMIN 3.1* 2.9* 2.3* 2.8* 2.5*   CBG: No results for input(s): "GLUCAP" in the last 168 hours.  Discharge time spent: greater than 30 minutes.  Signed: Demaris Fillers, MD [facility] 01/27/2024

## 2024-01-28 ENCOUNTER — Other Ambulatory Visit: Payer: Self-pay | Admitting: *Deleted

## 2024-01-28 DIAGNOSIS — J449 Chronic obstructive pulmonary disease, unspecified: Secondary | ICD-10-CM | POA: Diagnosis not present

## 2024-01-28 MED ORDER — OXYCODONE HCL 5 MG PO TABS: 5.0000 mg | ORAL_TABLET | Freq: Four times a day (QID) | ORAL | 0 refills | Status: DC | PRN

## 2024-01-29 ENCOUNTER — Other Ambulatory Visit: Payer: Self-pay

## 2024-01-29 DIAGNOSIS — D649 Anemia, unspecified: Secondary | ICD-10-CM

## 2024-01-29 DIAGNOSIS — J449 Chronic obstructive pulmonary disease, unspecified: Secondary | ICD-10-CM | POA: Diagnosis not present

## 2024-01-29 LAB — IMMUNOGLOBULINS A/E/G/M, SERUM
IgA: 344 mg/dL (ref 87–352)
IgE (Immunoglobulin E), Serum: 2069 [IU]/mL — ABNORMAL HIGH (ref 6–495)
IgG (Immunoglobin G), Serum: 1517 mg/dL (ref 586–1602)
IgM (Immunoglobulin M), Srm: 85 mg/dL (ref 26–217)

## 2024-01-29 NOTE — Telephone Encounter (Signed)
 Noted   Pt advised of getting lab work done. Pt advised me that she is going to the lab on the 9th because she has to be at the [facility] and she will have labs drawn then. Lab is in the system. Pt expressed understanding.

## 2024-01-29 NOTE — Progress Notes (Signed)
 Patient called nurses on 300 says her prednisolone  was not covered by insurance per pharmacy. Nurses calling TOC to contact patient. Patient discharged on 5/28. Patient advised to have pharmacy call her PCP to fine a generic medication that her insurance will cover. Patient agreed to call pharmacy and PCP.

## 2024-02-01 DIAGNOSIS — J449 Chronic obstructive pulmonary disease, unspecified: Secondary | ICD-10-CM | POA: Diagnosis not present

## 2024-02-02 DIAGNOSIS — J449 Chronic obstructive pulmonary disease, unspecified: Secondary | ICD-10-CM | POA: Diagnosis not present

## 2024-02-03 DIAGNOSIS — J449 Chronic obstructive pulmonary disease, unspecified: Secondary | ICD-10-CM | POA: Diagnosis not present

## 2024-02-04 DIAGNOSIS — J449 Chronic obstructive pulmonary disease, unspecified: Secondary | ICD-10-CM | POA: Diagnosis not present

## 2024-02-05 DIAGNOSIS — J449 Chronic obstructive pulmonary disease, unspecified: Secondary | ICD-10-CM | POA: Diagnosis not present

## 2024-02-06 ENCOUNTER — Emergency Department (HOSPITAL_COMMUNITY)

## 2024-02-06 ENCOUNTER — Encounter (HOSPITAL_COMMUNITY): Payer: Self-pay | Admitting: *Deleted

## 2024-02-06 ENCOUNTER — Other Ambulatory Visit: Payer: Self-pay

## 2024-02-06 ENCOUNTER — Emergency Department (HOSPITAL_COMMUNITY)
Admission: EM | Admit: 2024-02-06 | Discharge: 2024-02-06 | Disposition: A | Discharge status: Home / Self Care | Attending: Emergency Medicine | Admitting: Emergency Medicine

## 2024-02-06 DIAGNOSIS — R55 Syncope and collapse: Secondary | ICD-10-CM | POA: Diagnosis not present

## 2024-02-06 DIAGNOSIS — Z8542 Personal history of malignant neoplasm of other parts of uterus: Secondary | ICD-10-CM | POA: Insufficient documentation

## 2024-02-06 DIAGNOSIS — R519 Headache, unspecified: Secondary | ICD-10-CM | POA: Diagnosis not present

## 2024-02-06 DIAGNOSIS — R630 Anorexia: Secondary | ICD-10-CM | POA: Diagnosis not present

## 2024-02-06 DIAGNOSIS — R110 Nausea: Secondary | ICD-10-CM | POA: Insufficient documentation

## 2024-02-06 DIAGNOSIS — R531 Weakness: Secondary | ICD-10-CM | POA: Insufficient documentation

## 2024-02-06 DIAGNOSIS — R918 Other nonspecific abnormal finding of lung field: Secondary | ICD-10-CM | POA: Diagnosis not present

## 2024-02-06 LAB — RESP PANEL BY RT-PCR (RSV, FLU A&B, COVID)  RVPGX2
Influenza A by PCR: NEGATIVE
Influenza B by PCR: NEGATIVE
Resp Syncytial Virus by PCR: NEGATIVE
SARS Coronavirus 2 by RT PCR: NEGATIVE

## 2024-02-06 LAB — CBC WITH DIFFERENTIAL/PLATELET
Abs Immature Granulocytes: 0.05 10*3/uL (ref 0.00–0.07)
Basophils Absolute: 0.1 10*3/uL (ref 0.0–0.1)
Basophils Relative: 1 %
Eosinophils Absolute: 0.2 10*3/uL (ref 0.0–0.5)
Eosinophils Relative: 2 %
HCT: 33.8 % — ABNORMAL LOW (ref 36.0–46.0)
Hemoglobin: 11.3 g/dL — ABNORMAL LOW (ref 12.0–15.0)
Immature Granulocytes: 1 %
Lymphocytes Relative: 19 %
Lymphs Abs: 2.1 10*3/uL (ref 0.7–4.0)
MCH: 32.6 pg (ref 26.0–34.0)
MCHC: 33.4 g/dL (ref 30.0–36.0)
MCV: 97.4 fL (ref 80.0–100.0)
Monocytes Absolute: 1.4 10*3/uL — ABNORMAL HIGH (ref 0.1–1.0)
Monocytes Relative: 13 %
Neutro Abs: 7 10*3/uL (ref 1.7–7.7)
Neutrophils Relative %: 64 %
Platelets: 288 10*3/uL (ref 150–400)
RBC: 3.47 MIL/uL — ABNORMAL LOW (ref 3.87–5.11)
RDW: 15.9 % — ABNORMAL HIGH (ref 11.5–15.5)
WBC: 10.8 10*3/uL — ABNORMAL HIGH (ref 4.0–10.5)
nRBC: 0 % (ref 0.0–0.2)

## 2024-02-06 LAB — COMPREHENSIVE METABOLIC PANEL WITH GFR
ALT: 29 U/L (ref 0–44)
AST: 21 U/L (ref 15–41)
Albumin: 3.3 g/dL — ABNORMAL LOW (ref 3.5–5.0)
Alkaline Phosphatase: 73 U/L (ref 38–126)
Anion gap: 11 (ref 5–15)
BUN: 10 mg/dL (ref 6–20)
CO2: 20 mmol/L — ABNORMAL LOW (ref 22–32)
Calcium: 9.3 mg/dL (ref 8.9–10.3)
Chloride: 104 mmol/L (ref 98–111)
Creatinine, Ser: 0.91 mg/dL (ref 0.44–1.00)
GFR, Estimated: >60 mL/min (ref >60)
Glucose, Bld: 93 mg/dL (ref 70–99)
Potassium: 3.6 mmol/L (ref 3.5–5.1)
Sodium: 135 mmol/L (ref 135–145)
Total Bilirubin: 0.9 mg/dL (ref 0.0–1.2)
Total Protein: 7.4 g/dL (ref 6.5–8.1)

## 2024-02-06 LAB — URINALYSIS, ROUTINE W REFLEX MICROSCOPIC
Bilirubin Urine: NEGATIVE
Glucose, UA: NEGATIVE mg/dL
Hgb urine dipstick: NEGATIVE
Ketones, ur: NEGATIVE mg/dL
Leukocytes,Ua: NEGATIVE
Nitrite: NEGATIVE
Protein, ur: 100 mg/dL — AB
Specific Gravity, Urine: 1.024 (ref 1.005–1.030)
pH: 5 (ref 5.0–8.0)

## 2024-02-06 LAB — LACTIC ACID, PLASMA: Lactic Acid, Venous: 0.9 mmol/L (ref 0.5–1.9)

## 2024-02-06 LAB — TROPONIN I (HIGH SENSITIVITY): Troponin I (High Sensitivity): 5 ng/L (ref <18)

## 2024-02-06 LAB — MAGNESIUM: Magnesium: 1.7 mg/dL (ref 1.7–2.4)

## 2024-02-06 MED ORDER — ONDANSETRON HCL 4 MG PO TABS: 4.0000 mg | ORAL_TABLET | Freq: Four times a day (QID) | ORAL | 0 refills | Status: DC

## 2024-02-06 MED ORDER — BUTALBITAL-APAP-CAFFEINE 50-325-40 MG PO TABS
1.0000 | ORAL_TABLET | Freq: Once | ORAL | Status: AC
  Administered 2024-02-06: 1 via ORAL
  Filled 2024-02-06: qty 1

## 2024-02-06 MED ORDER — SODIUM CHLORIDE 0.9 % IV BOLUS
1000.0000 mL | Freq: Once | INTRAVENOUS | Status: AC
  Administered 2024-02-06: 1000 mL via INTRAVENOUS

## 2024-02-06 MED ORDER — DIPHENHYDRAMINE HCL 50 MG/ML IJ SOLN
25.0000 mg | Freq: Once | INTRAMUSCULAR | Status: AC
  Administered 2024-02-06: 25 mg via INTRAVENOUS
  Filled 2024-02-06: qty 1

## 2024-02-06 MED ORDER — LACTATED RINGERS IV BOLUS
500.0000 mL | Freq: Once | INTRAVENOUS | Status: AC
  Administered 2024-02-06: 500 mL via INTRAVENOUS

## 2024-02-06 MED ORDER — PROCHLORPERAZINE EDISYLATE 10 MG/2ML IJ SOLN
10.0000 mg | Freq: Once | INTRAMUSCULAR | Status: AC
  Administered 2024-02-06: 10 mg via INTRAVENOUS
  Filled 2024-02-06: qty 2

## 2024-02-06 NOTE — ED Notes (Signed)
 Patient verbalizes understanding of discharge instructions. Opportunity for questioning and answers were provided. Armband removed by staff, pt discharged from ED. Wheeled out to lobby with husband

## 2024-02-06 NOTE — ED Provider Notes (Signed)
 [name] Provider Note   CSN: [id number] Arrival date & time: 02/06/24  1526     History  No chief complaint on file.   Kristin Carson is a 53 y.o. female.  Patient is a 53 year old female who presents emergency department who notes that she has been having on and off headaches for approximately the past 3 days, nausea, lack of appetite.  She does have a history of endometrial cancer.  She notes that she has not gotten chemotherapy recently.  She denies any associated vomiting or diarrhea.  She has had no associated dysuria or hematuria.  She denies any chest pain, shortness of breath, palpitations.  There is no associated dizziness, lightheadedness or syncope.  She notes that she feels generally weak.  She has had no recent falls or blunt head trauma.  She is due to see her oncologist in 2 days.        Home Medications Prior to Admission medications   Medication Sig Start Date End Date Taking? Authorizing Provider  amLODipine  (NORVASC ) 10 MG tablet Take 1 tablet (10 mg total) by mouth daily. 11/23/23   Tobi Fortes, MD  brimonidine  (ALPHAGAN  P) 0.1 % SOLN Place 3 drops into both eyes immediately prior to start of T[place]  infusion 11/03/23   Paulett Boros, MD  budesonide -formoterol  (SYMBICORT ) 160-4.5 MCG/ACT inhaler Inhale 2 puffs into the lungs in the morning and at bedtime. Patient taking differently: Inhale 2 puffs into the lungs daily as needed. 10/28/23   Justina Oman, MD  Carboxymethylcellulose Sod PF 0.5 % SOLN Place 2 drops into both eyes 4 (four) times daily. Continue for 30 days after last dose of [place]  11/03/23   Paulett Boros, MD  levalbuterol  (XOPENEX  HFA) 45 MCG/ACT inhaler Inhale 2 puffs into the lungs every 8 (eight) hours as needed for wheezing or shortness of breath. 10/28/23 10/27/24  Justina Oman, MD  lidocaine -prilocaine  (EMLA ) cream Apply a quarter-sized amount to port a cath site and cover with plastic  wrap 1 hour prior to infusion appointments 12/03/23   Paulett Boros, MD  magnesium  oxide (MAG-OX) 400 (240 Mg) MG tablet Take 1 tablet (400 mg total) by mouth 3 (three) times daily. 12/24/23   Paulett Boros, MD  oxyCODONE  (OXY IR/ROXICODONE ) 5 MG immediate release tablet Take 1 tablet (5 mg total) by mouth every 6 (six) hours as needed for severe pain (pain score 7-10). 01/28/24   Paulett Boros, MD  prednisoLONE  5 MG TABS tablet Take 2 tablets (10 mg total) by mouth daily. Decrease by 2 tablets every 5 days 01/28/24   Tat, Myrtie Atkinson, MD  prednisoLONE  acetate (PRED FORTE ) 1 % ophthalmic suspension Place 1 drop into both eyes 2 (two) times daily. 01/18/24   [provider]  pregabalin  (LYRICA ) 200 MG capsule Take 1 capsule (200 mg total) by mouth 3 (three) times daily. 12/03/23   Paulett Boros, MD  famotidine  (PEPCID ) 20 MG tablet Take 1 tablet (20 mg total) by mouth 2 (two) times daily. 12/18/17 02/02/20  Knapp, Iva, MD  hydrochlorothiazide  (HYDRODIURIL ) 25 MG tablet Take 1 tablet (25 mg total) by mouth daily. 06/17/19 02/02/20  Knapp, Iva, MD      Allergies    Carrot [daucus carota], Lisinopril -hydrochlorothiazide , Ibuprofen , Other, Tramadol , and Erythromycin    Review of Systems   Review of Systems  Neurological:  Positive for weakness and headaches.  All other systems reviewed and are negative.   Physical Exam Updated Vital Signs BP 125/80  Pulse 98   Temp 98.2 F (36.8 C) (Oral)   Resp 10   Ht 5\' 5"  (1.651 m)   Wt 72.6 kg   LMP 08/15/2022 (Approximate)   SpO2 97%   BMI 26.63 kg/m  Physical Exam Vitals and nursing note reviewed.  Constitutional:      General: She is not in acute distress.    Appearance: Normal appearance. She is not ill-appearing.  HENT:     Head: Normocephalic and atraumatic.     Nose: Nose normal.     Mouth/Throat:     Mouth: Mucous membranes are moist.  Eyes:     Extraocular Movements: Extraocular movements intact.      Conjunctiva/sclera: Conjunctivae normal.     Pupils: Pupils are equal, round, and reactive to light.  Cardiovascular:     Rate and Rhythm: Normal rate and regular rhythm.     Pulses: Normal pulses.     Heart sounds: Normal heart sounds. No murmur heard.    No gallop.  Pulmonary:     Effort: Pulmonary effort is normal. No respiratory distress.     Breath sounds: Normal breath sounds. No stridor. No wheezing, rhonchi or rales.  Abdominal:     General: Abdomen is flat. Bowel sounds are normal. There is no distension.     Palpations: Abdomen is soft.     Tenderness: There is no abdominal tenderness. There is no guarding.  Musculoskeletal:        General: Normal range of motion.     Cervical back: Normal range of motion and neck supple. No rigidity or tenderness.     Right lower leg: No edema.     Left lower leg: No edema.  Skin:    General: Skin is warm and dry.     Findings: No bruising or rash.  Neurological:     General: No focal deficit present.     Mental Status: She is alert and oriented to person, place, and time. Mental status is at baseline.     Cranial Nerves: No cranial nerve deficit.     Sensory: No sensory deficit.     Motor: No weakness.     Coordination: Coordination normal.     Gait: Gait normal.  Psychiatric:        Mood and Affect: Mood normal.        Behavior: Behavior normal.        Thought Content: Thought content normal.        Judgment: Judgment normal.     ED Results / Procedures / Treatments   Labs (all labs ordered are listed, but only abnormal results are displayed) Labs Reviewed  CULTURE, BLOOD (ROUTINE X 2)  CULTURE, BLOOD (ROUTINE X 2)  RESP PANEL BY RT-PCR (RSV, FLU A&B, COVID)  [id number]  COMPREHENSIVE METABOLIC PANEL WITH GFR  CBC WITH DIFFERENTIAL/PLATELET  MAGNESIUM   URINALYSIS, ROUTINE W REFLEX MICROSCOPIC  LACTIC ACID, PLASMA  LACTIC ACID, PLASMA  TROPONIN I (HIGH SENSITIVITY)    EKG None  Radiology No results  found.  Procedures Procedures    Medications Ordered in ED Medications  sodium chloride  0.9 % bolus 1,000 mL (has no administration in time range)  prochlorperazine  (COMPAZINE ) injection 10 mg (has no administration in time range)  diphenhydrAMINE  (BENADRYL ) injection 25 mg (has no administration in time range)  butalbital-acetaminophen -caffeine (FIORICET) 50-325-40 MG per tablet 1 tablet (has no administration in time range)    ED Course/ Medical Decision Making/ A&P Clinical Course as of 02/06/24 1748  Sat Feb 06, 2024  1738 Patient does note that headache is improving at this time.  Heart rate has improved after IV fluids and treatment of the headache.  Blood work has been unremarkable at this point.  CT scan of the head demonstrated no signs of space-occupying lesion or intracranial hemorrhage.  Chest x-ray was unremarked for any signs of acute process. [CR]    Clinical Course User Index [CR] Roselynn Connors, PA-C                                 Medical Decision Making Amount and/or Complexity of Data Reviewed Labs: ordered. Radiology: ordered.  Risk Prescription drug management.   This patient presents to the ED for concern of headache generalized weakness, nausea differential diagnosis includes electrolyte derangement, acute kidney injury, CVA, TIA, intracranial hemorrhage, migraine    Additional history obtained:  Additional history obtained from medical records External records from outside source obtained and reviewed including medical records   Lab Tests:  I Ordered, and personally interpreted labs.  The pertinent results include: Mild leukocytosis, anemia to baseline, normal electrolytes, normal creatinine, normal liver function, negative troponin, negative lactic acid, normal magnesium , negative viral swab   Imaging Studies ordered:  I ordered imaging studies including CT scan of head, chest x-ray I independently visualized and interpreted imaging  which showed no acute intracranial hemorrhage, no acute cardiopulmonary process I agree with the radiologist interpretation   Medicines ordered and prescription drug management:  I ordered medication including LR, Compazine , Benadryl , Fioricet for headache, dehydration Reevaluation of the patient after these medicines showed that the patient improved I have reviewed the patients home medicines and have made adjustments as needed   Problem List / ED Course:  Patient is doing well at this time.  Headache has greatly improved with treatment in the emergency department.  Blood work has been overall unremarkable.  CT scan of the head demonstrated no indication for space-occupying lesion, intracranial hemorrhage, ischemic changes.  She has no concern neurological deficits on exam.  Suspect the patient will be able to be discharged home.  Pending urinalysis results at this time.  Will sign patient out to attending physician pending this.   Social Determinants of Health:  None           Final Clinical Impression(s) / ED Diagnoses Final diagnoses:  None    Rx / DC Orders ED Discharge Orders     None         Emmalene Hare 02/06/24 1830    Early Glisson, MD 02/06/24 1943

## 2024-02-06 NOTE — ED Triage Notes (Signed)
 Pt with c/o HA since off and on for past couple days, loss of appetite, generalized weakness. + nausea

## 2024-02-06 NOTE — Discharge Instructions (Signed)
 Zofran is a medication which can help with nausea.  You may take 4 mg by mouth every 6 hours as needed if you are an adult, if your child under the age of 6 take half of a tablet or 2 mg every 6 hours as needed.  This should dissolve on your tongue within a short timeframe.  Wait about 30 minutes after taking it to help with drinking clear liquids.  Thank you for allowing [place] to treat you in the emergency department today.  After reviewing your examination and potential testing that was done it appears that you are safe to go home.  I would like for you to follow-up with your doctor within the next several days, have them obtain your records and follow-up with them to review all potential tests and results from your visit.  If you should develop severe or worsening symptoms return to the emergency department immediately

## 2024-02-08 ENCOUNTER — Inpatient Hospital Stay (HOSPITAL_BASED_OUTPATIENT_CLINIC_OR_DEPARTMENT_OTHER): Admitting: Hematology

## 2024-02-08 ENCOUNTER — Other Ambulatory Visit: Payer: Self-pay | Admitting: *Deleted

## 2024-02-08 ENCOUNTER — Inpatient Hospital Stay: Attending: Hematology

## 2024-02-08 ENCOUNTER — Inpatient Hospital Stay

## 2024-02-08 VITALS — BP 136/84 | HR 102 | Temp 97.5°F | Resp 20

## 2024-02-08 VITALS — Wt 149.7 lb

## 2024-02-08 DIAGNOSIS — G629 Polyneuropathy, unspecified: Secondary | ICD-10-CM | POA: Diagnosis not present

## 2024-02-08 DIAGNOSIS — C787 Secondary malignant neoplasm of liver and intrahepatic bile duct: Secondary | ICD-10-CM | POA: Insufficient documentation

## 2024-02-08 DIAGNOSIS — I10 Essential (primary) hypertension: Secondary | ICD-10-CM | POA: Diagnosis not present

## 2024-02-08 DIAGNOSIS — G622 Polyneuropathy due to other toxic agents: Secondary | ICD-10-CM

## 2024-02-08 DIAGNOSIS — C538 Malignant neoplasm of overlapping sites of cervix uteri: Secondary | ICD-10-CM

## 2024-02-08 DIAGNOSIS — C539 Malignant neoplasm of cervix uteri, unspecified: Secondary | ICD-10-CM | POA: Insufficient documentation

## 2024-02-08 DIAGNOSIS — Z95828 Presence of other vascular implants and grafts: Secondary | ICD-10-CM

## 2024-02-08 DIAGNOSIS — Z801 Family history of malignant neoplasm of trachea, bronchus and lung: Secondary | ICD-10-CM | POA: Diagnosis not present

## 2024-02-08 DIAGNOSIS — J449 Chronic obstructive pulmonary disease, unspecified: Secondary | ICD-10-CM | POA: Diagnosis not present

## 2024-02-08 DIAGNOSIS — C786 Secondary malignant neoplasm of retroperitoneum and peritoneum: Secondary | ICD-10-CM | POA: Insufficient documentation

## 2024-02-08 DIAGNOSIS — Z5111 Encounter for antineoplastic chemotherapy: Secondary | ICD-10-CM | POA: Diagnosis present

## 2024-02-08 DIAGNOSIS — C7801 Secondary malignant neoplasm of right lung: Secondary | ICD-10-CM | POA: Insufficient documentation

## 2024-02-08 DIAGNOSIS — D649 Anemia, unspecified: Secondary | ICD-10-CM | POA: Insufficient documentation

## 2024-02-08 DIAGNOSIS — Z808 Family history of malignant neoplasm of other organs or systems: Secondary | ICD-10-CM | POA: Diagnosis not present

## 2024-02-08 DIAGNOSIS — C7802 Secondary malignant neoplasm of left lung: Secondary | ICD-10-CM | POA: Diagnosis not present

## 2024-02-08 DIAGNOSIS — F17210 Nicotine dependence, cigarettes, uncomplicated: Secondary | ICD-10-CM | POA: Insufficient documentation

## 2024-02-08 DIAGNOSIS — R634 Abnormal weight loss: Secondary | ICD-10-CM | POA: Insufficient documentation

## 2024-02-08 DIAGNOSIS — D500 Iron deficiency anemia secondary to blood loss (chronic): Secondary | ICD-10-CM

## 2024-02-08 DIAGNOSIS — Z7689 Persons encountering health services in other specified circumstances: Secondary | ICD-10-CM | POA: Diagnosis not present

## 2024-02-08 LAB — COMPREHENSIVE METABOLIC PANEL WITH GFR
ALT: 21 U/L (ref 0–44)
AST: 21 U/L (ref 15–41)
Albumin: 3.3 g/dL — ABNORMAL LOW (ref 3.5–5.0)
Alkaline Phosphatase: 71 U/L (ref 38–126)
Anion gap: 12 (ref 5–15)
BUN: 6 mg/dL (ref 6–20)
CO2: 19 mmol/L — ABNORMAL LOW (ref 22–32)
Calcium: 9.5 mg/dL (ref 8.9–10.3)
Chloride: 105 mmol/L (ref 98–111)
Creatinine, Ser: 0.8 mg/dL (ref 0.44–1.00)
GFR, Estimated: >60 mL/min (ref >60)
Glucose, Bld: 80 mg/dL (ref 70–99)
Potassium: 3.2 mmol/L — ABNORMAL LOW (ref 3.5–5.1)
Sodium: 136 mmol/L (ref 135–145)
Total Bilirubin: 0.5 mg/dL (ref 0.0–1.2)
Total Protein: 7.6 g/dL (ref 6.5–8.1)

## 2024-02-08 LAB — CBC WITH DIFFERENTIAL/PLATELET
Abs Immature Granulocytes: 0.03 10*3/uL (ref 0.00–0.07)
Basophils Absolute: 0 10*3/uL (ref 0.0–0.1)
Basophils Relative: 0 %
Eosinophils Absolute: 0.2 10*3/uL (ref 0.0–0.5)
Eosinophils Relative: 2 %
HCT: 32.9 % — ABNORMAL LOW (ref 36.0–46.0)
Hemoglobin: 11.3 g/dL — ABNORMAL LOW (ref 12.0–15.0)
Immature Granulocytes: 0 %
Lymphocytes Relative: 28 %
Lymphs Abs: 3 10*3/uL (ref 0.7–4.0)
MCH: 33.5 pg (ref 26.0–34.0)
MCHC: 34.3 g/dL (ref 30.0–36.0)
MCV: 97.6 fL (ref 80.0–100.0)
Monocytes Absolute: 1 10*3/uL (ref 0.1–1.0)
Monocytes Relative: 9 %
Neutro Abs: 6.5 10*3/uL (ref 1.7–7.7)
Neutrophils Relative %: 61 %
Platelets: 281 10*3/uL (ref 150–400)
RBC: 3.37 MIL/uL — ABNORMAL LOW (ref 3.87–5.11)
RDW: 15.5 % (ref 11.5–15.5)
WBC: 10.7 10*3/uL — ABNORMAL HIGH (ref 4.0–10.5)
nRBC: 0 % (ref 0.0–0.2)

## 2024-02-08 LAB — MAGNESIUM: Magnesium: 1.6 mg/dL — ABNORMAL LOW (ref 1.7–2.4)

## 2024-02-08 LAB — URINE CULTURE

## 2024-02-08 MED ORDER — PROCHLORPERAZINE MALEATE 10 MG PO TABS: 10.0000 mg | ORAL_TABLET | Freq: Four times a day (QID) | ORAL | 2 refills | Status: DC | PRN

## 2024-02-08 MED ORDER — OXYCODONE HCL 5 MG PO TABS
5.0000 mg | ORAL_TABLET | Freq: Once | ORAL | Status: AC
  Administered 2024-02-08: 5 mg via ORAL
  Filled 2024-02-08: qty 1

## 2024-02-08 MED ORDER — SODIUM CHLORIDE 0.9% FLUSH
10.0000 mL | Freq: Once | INTRAVENOUS | Status: AC
  Administered 2024-02-08: 10 mL via INTRAVENOUS

## 2024-02-08 MED ORDER — LIDOCAINE-PRILOCAINE 2.5-2.5 % EX CREA: TOPICAL_CREAM | CUTANEOUS | 3 refills | Status: DC

## 2024-02-08 MED ORDER — PACLITAXEL PROTEIN-BOUND CHEMO INJECTION 100 MG
100.0000 mg/m2 | Freq: Once | INTRAVENOUS | Status: AC
  Administered 2024-02-08: 175 mg via INTRAVENOUS
  Filled 2024-02-08: qty 35

## 2024-02-08 MED ORDER — ONDANSETRON HCL 4 MG/2ML IJ SOLN
8.0000 mg | Freq: Once | INTRAMUSCULAR | Status: AC
  Administered 2024-02-08: 8 mg via INTRAVENOUS
  Filled 2024-02-08: qty 4

## 2024-02-08 MED ORDER — PREGABALIN 200 MG PO CAPS: 200.0000 mg | ORAL_CAPSULE | Freq: Three times a day (TID) | ORAL | 0 refills | Status: DC

## 2024-02-08 MED ORDER — SODIUM CHLORIDE 0.9% FLUSH
10.0000 mL | INTRAVENOUS | Status: DC | PRN
  Administered 2024-02-08: 10 mL

## 2024-02-08 MED ORDER — HEPARIN SOD (PORK) LOCK FLUSH 100 UNIT/ML IV SOLN
500.0000 [IU] | Freq: Once | INTRAVENOUS | Status: AC | PRN
  Administered 2024-02-08: 500 [IU]

## 2024-02-08 MED ORDER — MAGNESIUM SULFATE 2 GM/50ML IV SOLN
2.0000 g | Freq: Once | INTRAVENOUS | Status: AC
  Administered 2024-02-08: 2 g via INTRAVENOUS
  Filled 2024-02-08: qty 50

## 2024-02-08 MED ORDER — POTASSIUM CHLORIDE IN NACL 20-0.9 MEQ/L-% IV SOLN
Freq: Once | INTRAVENOUS | Status: AC
  Filled 2024-02-08: qty 1000

## 2024-02-08 MED ORDER — MEGESTROL ACETATE 400 MG/10ML PO SUSP: 400.0000 mg | Freq: Two times a day (BID) | ORAL | 3 refills | Status: DC

## 2024-02-08 MED ORDER — SODIUM CHLORIDE 0.9 % IV SOLN: INTRAVENOUS | Status: DC

## 2024-02-08 NOTE — Progress Notes (Signed)
 Pharmacist Chemotherapy Monitoring - Initial Assessment    Anticipated start date: 02/08/24   The following has been reviewed per standard work regarding the patient's treatment regimen: The patient's diagnosis, treatment plan and drug doses, and organ/hematologic function Lab orders and baseline tests specific to treatment regimen  The treatment plan start date, drug sequencing, and pre-medications Prior authorization status  Patient's documented medication list, including drug-drug interaction screen and prescriptions for anti-emetics and supportive care specific to the treatment regimen The drug concentrations, fluid compatibility, administration routes, and timing of the medications to be used The patient's access for treatment and lifetime cumulative dose history, if applicable  The patient's medication allergies and previous infusion related reactions, if applicable   Changes made to treatment plan:  N/A  Follow up needed:  N/A  OK to proceed per PA team.  OK to proceed with elevated HR 115 per Dr Cheree Cords.   Kristin Carson, [facility], 02/08/2024  11:41 AM

## 2024-02-08 NOTE — Progress Notes (Signed)
 DISCONTINUE OFF PATHWAY REGIMEN - Other   [id number]:Tisotumab vedotin  2 mg/kg IV D1 q21 Days:   A cycle is every 21 days:     Tisotumab vedotin -tftv   **Always confirm dose/schedule in your pharmacy ordering system**  PRIOR TREATMENT: Tisotumab vedotin  2 mg/kg IV D1 q21 Days  START OFF PATHWAY REGIMEN - Other   [id number]:Nab-Paclitaxel  100 mg/m2 IV 1,8,15 q21 Days:   A cycle is every 21 days:     Nab-paclitaxel  (protein bound)   **Always confirm dose/schedule in your pharmacy ordering system**  Patient Characteristics: Intent of Therapy: Non-Curative / Palliative Intent, Discussed with Patient

## 2024-02-08 NOTE — Progress Notes (Signed)
 [facility] [address] [place] [address]    Clinic Day:  02/08/24     Referring physician: Tobi Fortes, MD  Patient Care Team: Tobi Fortes, MD as PCP - General (Internal Medicine) Paulett Boros, MD as Medical Oncologist (Medical Oncology) Gerhard Knuckles, RN as Oncology Nurse Navigator (Medical Oncology)   ASSESSMENT & PLAN:   Assessment: 1.  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 - CTAP (08/30/2022): Irregularity of the endometrium extending through the cervix with foci of gas in the cervix.  Multifocal omental and peritoneal metastasis.  Metastatic left periaortic, aortocaval, left external iliac and right common iliac lymph nodes.  Multiple bilateral lung nodules at bases.  12 mm hypodensity in the liver near the falciform ligament. - Endometrial biopsy (09/09/2022): Invasive moderately to poorly differentiated squamous cell carcinoma with abundant necrosis.  Cervix biopsy: Invasive and in situ moderate to poorly differentiated squamous cell carcinoma.  High risk HPV positive. - NGS: PD-L1 (22 C3): CPS 2, HER2 IHC negative.  PIK3CA exon 10 pathogenic variant.  MS-stable.  TMB-low.  FANCM and SDHA pathogenic variants. - PD-L1 CPS: 8% - Cycle 1 of cisplatin , paclitaxel , bevacizumab  on 10/02/2022, pembrolizumab  added with cycle 2 on 10/23/2022 through 09/17/2023 (cycle 12) discontinued due to progression.  Paclitaxel  was discontinued on 05/12/2023 from neuropathy. - CT CAP (10/18/2022): Increased size and number of bilateral lung nodules.  New subtle hepatic hypodensity suspicious for metastatic disease.  Enlarged left periportal lymph node. 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   2.  Social/family history: - She is married and lives at home with her husband and roommate.  She has never worked.  She is current active smoker, half pack per day, for the last 28 years.  She occasionally smokes marijuana. - Mother died of lung cancer.  Father had head and neck cancer.  Maternal grandmother had cancer.  Paternal uncle died of cancer.  Maternal uncle also had cancer.    Plan: 1.  Metastatic cervical cancer to the lungs, peritoneum and liver: - She has completed 3 cycles of Tivdak , last treatment on 12/24/2023. - She was hospitalized from 01/23/2024 through 01/27/2024.  Prior to hospitalization, she had 2 to 3 days of severe vomiting, every time she ate or drank.  She also felt dizzy and lightheaded.  During hospitalization, she was found to have acute liver injury with LFTs elevated several 100 times.  Initially it was thought to be drug-induced liver injury but as her LFTs improved quickly, it was thought that hypotension could be contributing to acute liver injury. - CTAP (01/23/2024): Increase in size of liver and lung lesions. - I compared her scans with scans from February.  There is progression. - We will discontinue Tivdak .  Other options include paclitaxel , docetaxel, Abraxane , pemetrexed, vinorelbine, gemcitabine. - She had a good response with paclitaxel  in the past which was discontinued due to neuropathy.  I have recommended Abraxane  at 125 mg/m 3 weeks on/1 week off.  We will start at 100 mg/m and see how she tolerates it.  We discussed side effects in detail including worsening of neuropathy. - Will also  obtain a CT chest with contrast as a baseline. - She will start her cycle 1 today.  RTC 4 weeks for follow-up.   2.  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  Hemoglobin today is 11.3.   3.  Peripheral neuropathy: - Continue  Lyrica  3 times daily and oxycodone  daily as needed.  Neuropathy is well-controlled.  Will closely monitor.   4.  Hypertension: - Continue amlodipine  10 mg daily.  Blood pressure is 119/84.  5.  Hypomagnesemia: - She reports taking magnesium  3 times daily.  Magnesium  is low at 1.6.  She will receive IV magnesium .  6.  Weight loss/decreased appetite: - She lost 11 pounds since 01/23/2024.  We will start her on Megace  400 mg twice daily.        Orders Placed This Encounter  Procedures   Magnesium     Standing Status:   Future    Expected Date:   02/09/2024    Expiration Date:   02/08/2025   CBC with Differential    Standing Status:   Future    Expected Date:   02/09/2024    Expiration Date:   02/08/2025   Comprehensive metabolic panel    Standing Status:   Future    Expected Date:   02/09/2024    Expiration Date:   02/08/2025   Magnesium     Standing Status:   Future    Expected Date:   02/16/2024    Expiration Date:   02/15/2025   CBC with Differential    Standing Status:   Future    Expected Date:   02/16/2024    Expiration Date:   02/15/2025   Comprehensive metabolic panel    Standing Status:   Future    Expected Date:   02/16/2024    Expiration Date:   02/15/2025   Magnesium     Standing Status:   Future    Expected Date:   02/23/2024    Expiration Date:   02/22/2025   CBC with Differential    Standing Status:   Future    Expected Date:   02/23/2024    Expiration Date:   02/22/2025   Comprehensive metabolic panel    Standing Status:   Future    Expected Date:   02/23/2024    Expiration Date:   02/22/2025   Magnesium     Standing Status:   Future    Expected Date:   03/08/2024    Expiration Date:   03/08/2025   CBC with Differential    Standing Status:   Future    Expected Date:   03/08/2024    Expiration Date:   03/08/2025   Comprehensive metabolic panel    Standing Status:   Future    Expected Date:   03/08/2024    Expiration Date:   03/08/2025   Magnesium     Standing Status:   Future     Expected Date:   03/15/2024    Expiration Date:   03/15/2025   CBC with Differential    Standing Status:   Future    Expected Date:   03/15/2024    Expiration Date:   03/15/2025   Comprehensive metabolic panel    Standing Status:   Future    Expected Date:   03/15/2024    Expiration Date:   03/15/2025   Magnesium     Standing Status:   Future    Expected Date:   03/22/2024    Expiration Date:   03/22/2025   CBC with Differential  Standing Status:   Future    Expected Date:   03/22/2024    Expiration Date:   03/22/2025   Comprehensive metabolic panel    Standing Status:   Future    Expected Date:   03/22/2024    Expiration Date:   03/22/2025   Magnesium     Standing Status:   Future    Expected Date:   04/05/2024    Expiration Date:   04/05/2025   CBC with Differential    Standing Status:   Future    Expected Date:   04/05/2024    Expiration Date:   04/05/2025   Comprehensive metabolic panel    Standing Status:   Future    Expected Date:   04/05/2024    Expiration Date:   04/05/2025   Magnesium     Standing Status:   Future    Expected Date:   04/12/2024    Expiration Date:   04/12/2025   CBC with Differential    Standing Status:   Future    Expected Date:   04/12/2024    Expiration Date:   04/12/2025   Comprehensive metabolic panel    Standing Status:   Future    Expected Date:   04/12/2024    Expiration Date:   04/12/2025   Magnesium     Standing Status:   Future    Expected Date:   04/19/2024    Expiration Date:   04/19/2025   CBC with Differential    Standing Status:   Future    Expected Date:   04/19/2024    Expiration Date:   04/19/2025   Comprehensive metabolic panel    Standing Status:   Future    Expected Date:   04/19/2024    Expiration Date:   04/19/2025   Magnesium     Standing Status:   Future    Expected Date:   05/03/2024    Expiration Date:   05/03/2025   CBC with Differential    Standing Status:   Future    Expected Date:   05/03/2024    Expiration Date:   05/03/2025    Comprehensive metabolic panel    Standing Status:   Future    Expected Date:   05/03/2024    Expiration Date:   05/03/2025   Magnesium     Standing Status:   Future    Expected Date:   05/10/2024    Expiration Date:   05/10/2025   CBC with Differential    Standing Status:   Future    Expected Date:   05/10/2024    Expiration Date:   05/10/2025   Comprehensive metabolic panel    Standing Status:   Future    Expected Date:   05/10/2024    Expiration Date:   05/10/2025   Magnesium     Standing Status:   Future    Expected Date:   05/17/2024    Expiration Date:   05/17/2025   CBC with Differential    Standing Status:   Future    Expected Date:   05/17/2024    Expiration Date:   05/17/2025   Comprehensive metabolic panel    Standing Status:   Future    Expected Date:   05/17/2024    Expiration Date:   05/17/2025      Hurman Maiden R Teague,acting as a scribe for Paulett Boros, MD.,have documented all relevant documentation on the behalf of Paulett Boros, MD,as directed by  Paulett Boros, MD while in the presence of Paulett Boros,  MD.  Tora Freeman MD, have reviewed the above documentation for accuracy and completeness, and I agree with the above.      Paulett Boros, MD   6/9/202512:48 PM  CHIEF COMPLAINT:   Diagnosis: metastatic cervical squamous cell carcinoma    Cancer Staging  Cervical cancer [facility]) Staging form: Cervix Uteri, AJCC Version 9 - Clinical stage from 09/19/2022: FIGO Stage IVB (cT4, cN2a, cM1) - Unsigned    Prior Therapy: 1.  Cisplatin , paclitaxel , bevacizumab  and Keytruda  2.  Tivdak  x 3 cycles  Current Therapy: Abraxane  3 weeks on/1 week off   HISTORY OF PRESENT ILLNESS:   Oncology History Overview Note  PD-L1 CPS 8%   Cervical cancer (HCC)  09/19/2022 Initial Diagnosis   Cervical cancer (HCC)   10/02/2022 - 09/17/2023 Chemotherapy   Patient is on Treatment Plan : CERVICAL Cisplatin  50 mg/m2 + PAClitaxel  135 mg/m2 + Bevacizumab  15  mg/kg + Keytruda  q21d     10/29/2023 Genetic Testing   Negative genetic testing on the CancerNext+RNAinsight.  The report date is 10/29/2023.  The [facility] CancerNext+RNAinsight Panel includes sequencing, rearrangement analysis, and RNA analysis for the following 39 genes: APC, ATM, BAP1, BARD1, BMPR1A, BRCA1, BRCA2, BRIP1, CDH1, CDKN2A, CHEK2, FH, FLCN, MET, MLH1, MSH2, MSH6, MUTYH, NF1, NTHL1, PALB2, PMS2, PTEN, RAD51C, RAD51D, SMAD4, STK11, TP53, TSC1, TSC2, and VHL (sequencing and deletion/duplication); AXIN2, HOXB13, MBD4, MSH3, POLD1 and POLE (sequencing only); EPCAM and GREM1 (deletion/duplication only).   11/12/2023 - 12/24/2023 Chemotherapy   Patient is on Treatment Plan : CERVICAL Tisotumab vedotin -tftv (Tivdak ) q21d     02/08/2024 -  Chemotherapy   Patient is on Treatment Plan : CERVIX PACLitaxel -Albumin (Abraxane ) (100) D1,8,15 q28d     Uterine cancer (HCC)  10/15/2023 Initial Diagnosis   Uterine cancer (HCC)   10/29/2023 Genetic Testing   Negative genetic testing on the CancerNext+RNAinsight.  The report date is 10/29/2023.  The [facility] CancerNext+RNAinsight Panel includes sequencing, rearrangement analysis, and RNA analysis for the following 39 genes: APC, ATM, BAP1, BARD1, BMPR1A, BRCA1, BRCA2, BRIP1, CDH1, CDKN2A, CHEK2, FH, FLCN, MET, MLH1, MSH2, MSH6, MUTYH, NF1, NTHL1, PALB2, PMS2, PTEN, RAD51C, RAD51D, SMAD4, STK11, TP53, TSC1, TSC2, and VHL (sequencing and deletion/duplication); AXIN2, HOXB13, MBD4, MSH3, POLD1 and POLE (sequencing only); EPCAM and GREM1 (deletion/duplication only).      INTERVAL HISTORY:   Lumen is a 53 y.o. female presenting to clinic today for follow up of metastatic cervical squamous cell carcinoma. She was last seen by me on 12/24/23.  Since her last visit, she was admitted to the hospital from 01/24/24 to 01/27/24 for transaminitis.   Today, she states that she is doing well overall. Her appetite level is at 25%. Her energy level is at 25%.   PAST MEDICAL  HISTORY:   Past Medical History: Past Medical History:  Diagnosis Date   Anemia    Asthma    Bronchitis    Cancer (HCC)    Cervical   CHF (congestive heart failure) (HCC)    COPD (chronic obstructive pulmonary disease) (HCC)    Depression    GERD (gastroesophageal reflux disease)    Headache    Hypertension    Port-A-Cath in place 09/25/2022    Surgical History: Past Surgical History:  Procedure Laterality Date   BREAST SURGERY     CESAREAN SECTION     IR IMAGING GUIDED PORT INSERTION  09/30/2022   LEG SURGERY      Social History: Social History   Socioeconomic History   Marital status: Married  Spouse name: Not on file   Number of children: Not on file   Years of education: Not on file   Highest education level: Not on file  Occupational History   Not on file  Tobacco Use   Smoking status: Every Day    Current packs/day: 0.25    Average packs/day: 0.3 packs/day for 23.0 years (5.8 ttl pk-yrs)    Types: Cigarettes   Smokeless tobacco: Never  Vaping Use   Vaping status: Never Used  Substance and Sexual Activity   Alcohol  use: Yes    Comment: occ   Drug use: Yes    Types: Marijuana   Sexual activity: Yes    Birth control/protection: None  Other Topics Concern   Not on file  Social History Narrative   Not on file   Social Drivers of Health   Financial Resource Strain: Low Risk  (09/09/2022)   Overall Financial Resource Strain (CARDIA)    Difficulty of Paying Living Expenses: Not hard at all  Food Insecurity: No Food Insecurity (01/24/2024)   Hunger Vital Sign    Worried About Running Out of Food in the Last Year: Never true    Ran Out of Food in the Last Year: Never true  Transportation Needs: No Transportation Needs (01/24/2024)   PRAPARE - Administrator, Civil Service (Medical): No    Lack of Transportation (Non-Medical): No  Physical Activity: Insufficiently Active (09/09/2022)   Exercise Vital Sign    Days of Exercise per Week: 2 days     Minutes of Exercise per Session: 20 min  Stress: No Stress Concern Present (09/09/2022)   [employer] of Occupational Health - Occupational Stress Questionnaire    Feeling of Stress : Only a little  Social Connections: Socially Isolated (01/24/2024)   Social Connection and Isolation Panel [NHANES]    Frequency of Communication with Friends and Family: More than three times a week    Frequency of Social Gatherings with Friends and Family: More than three times a week    Attends Religious Services: Never    Database administrator or Organizations: No    Attends Banker Meetings: Never    Marital Status: Separated  Intimate Partner Violence: Not At Risk (01/24/2024)   Humiliation, Afraid, Rape, and Kick questionnaire    Fear of Current or Ex-Partner: No    Emotionally Abused: No    Physically Abused: No    Sexually Abused: No    Family History: Family History  Problem Relation Age of Onset   Dermatomyositis Mother    Hypertension Mother    Cancer Mother        lung   Cancer Father        lung   Heart disease Father    Lung cancer Paternal Uncle    Lung cancer Maternal Uncle    Colon cancer Neg Hx    Breast cancer Neg Hx    Ovarian cancer Neg Hx    Endometrial cancer Neg Hx    Pancreatic cancer Neg Hx    Prostate cancer Neg Hx     Current Medications:  Current Outpatient Medications:    megestrol  (MEGACE ) 400 MG/10ML suspension, Take 10 mLs (400 mg total) by mouth 2 (two) times daily., Disp: 480 mL, Rfl: 3   prochlorperazine  (COMPAZINE ) 10 MG tablet, Take 1 tablet (10 mg total) by mouth every 6 (six) hours as needed., Disp: 60 tablet, Rfl: 2   amLODipine  (NORVASC ) 10 MG tablet, Take 1  tablet (10 mg total) by mouth daily., Disp: 90 tablet, Rfl: 3   budesonide -formoterol  (SYMBICORT ) 160-4.5 MCG/ACT inhaler, Inhale 2 puffs into the lungs in the morning and at bedtime. (Patient taking differently: Inhale 2 puffs into the lungs daily as needed.), Disp: 1 each,  Rfl: 3   levalbuterol  (XOPENEX  HFA) 45 MCG/ACT inhaler, Inhale 2 puffs into the lungs every 8 (eight) hours as needed for wheezing or shortness of breath., Disp: 1 each, Rfl: 2   lidocaine -prilocaine  (EMLA ) cream, Apply a quarter-sized amount to port a cath site and cover with plastic wrap 1 hour prior to infusion appointments, Disp: 30 g, Rfl: 3   magnesium  oxide (MAG-OX) 400 (240 Mg) MG tablet, Take 1 tablet (400 mg total) by mouth 3 (three) times daily., Disp: 90 tablet, Rfl: 3   oxyCODONE  (OXY IR/ROXICODONE ) 5 MG immediate release tablet, Take 1 tablet (5 mg total) by mouth every 6 (six) hours as needed for severe pain (pain score 7-10)., Disp: 120 tablet, Rfl: 0   prednisoLONE  5 MG TABS tablet, Take 2 tablets (10 mg total) by mouth daily. Decrease by 2 tablets every 5 days, Disp: 174 tablet, Rfl: 0   prednisoLONE  acetate (PRED FORTE ) 1 % ophthalmic suspension, Place 1 drop into both eyes 2 (two) times daily., Disp: , Rfl:    pregabalin  (LYRICA ) 200 MG capsule, Take 1 capsule (200 mg total) by mouth 3 (three) times daily., Disp: 180 capsule, Rfl: 0 No current facility-administered medications for this visit.  Facility-Administered Medications Ordered in Other Visits:    0.9 %  sodium chloride  infusion, , Intravenous, Continuous, Merrill Deanda, MD   heparin  lock flush 100 unit/mL, 500 Units, Intracatheter, Once PRN, Teandra Harlan, MD   ondansetron  (ZOFRAN ) injection 8 mg, 8 mg, Intravenous, Once, Renne Cornick, MD   PACLitaxel -protein bound (ABRAXANE ) chemo infusion 175 mg, 100 mg/m2 (Treatment Plan Recorded), Intravenous, Once, Paulett Boros, MD   sodium chloride  flush (NS) 0.9 % injection 10 mL, 10 mL, Intracatheter, PRN, Tyress Loden, MD   Allergies: Allergies  Allergen Reactions   Carrot [Daucus Carota] Hives   Lisinopril -Hydrochlorothiazide      Oral swelling   Ibuprofen  Other (See Comments)    Oral swelling   Other Itching and Other (See Comments)     Hair dye, blisters, pus-filled, soreness   Tramadol  Hives   Erythromycin Hives    REVIEW OF SYSTEMS:   Review of Systems  Constitutional:  Positive for fatigue. Negative for chills and fever.  HENT:   Negative for lump/mass, mouth sores, nosebleeds, sore throat and trouble swallowing.   Eyes:  Negative for eye problems.  Respiratory:  Negative for cough and shortness of breath.   Cardiovascular:  Negative for chest pain, leg swelling and palpitations.  Gastrointestinal:  Positive for nausea. Negative for abdominal pain, constipation, diarrhea and vomiting.  Genitourinary:  Negative for bladder incontinence, difficulty urinating, dysuria, frequency, hematuria and nocturia.   Musculoskeletal:  Negative for arthralgias, back pain, flank pain, myalgias and neck pain.  Skin:  Negative for itching and rash.  Neurological:  Positive for headaches. Negative for dizziness and numbness.  Hematological:  Does not bruise/bleed easily.  Psychiatric/Behavioral:  Negative for depression, sleep disturbance and suicidal ideas. The patient is not nervous/anxious.   All other systems reviewed and are negative.    VITALS:   Weight 149 lb 11.1 oz (67.9 kg), last menstrual period 08/15/2022.  Wt Readings from Last 3 Encounters:  02/08/24 149 lb 11.1 oz (67.9 kg)  02/06/24 160 lb (72.6 kg)  01/23/24 160 lb (72.6 kg)    Body mass index is 24.91 kg/m.  Performance status (ECOG): 1 - Symptomatic but completely ambulatory  PHYSICAL EXAM:   Physical Exam Vitals and nursing note reviewed. Exam conducted with a chaperone present.  Constitutional:      Appearance: Normal appearance.  Cardiovascular:     Rate and Rhythm: Normal rate and regular rhythm.     Pulses: Normal pulses.     Heart sounds: Normal heart sounds.  Pulmonary:     Effort: Pulmonary effort is normal.     Breath sounds: Normal breath sounds.  Abdominal:     Palpations: Abdomen is soft. There is no hepatomegaly, splenomegaly or  mass.     Tenderness: There is no abdominal tenderness.  Musculoskeletal:     Right lower leg: No edema.     Left lower leg: No edema.  Lymphadenopathy:     Cervical: No cervical adenopathy.     Right cervical: No superficial, deep or posterior cervical adenopathy.    Left cervical: No superficial, deep or posterior cervical adenopathy.     Upper Body:     Right upper body: No supraclavicular or axillary adenopathy.     Left upper body: No supraclavicular or axillary adenopathy.  Neurological:     General: No focal deficit present.     Mental Status: She is alert and oriented to person, place, and time.  Psychiatric:        Mood and Affect: Mood normal.        Behavior: Behavior normal.     LABS:      Latest Ref Rng & Units 02/08/2024    9:36 AM 02/06/2024    4:24 PM 01/27/2024    4:27 AM  CBC  WBC 4.0 - 10.5 K/uL 10.7  10.8  11.3   Hemoglobin 12.0 - 15.0 g/dL 09.8  11.9  14.7   Hematocrit 36.0 - 46.0 % 32.9  33.8  28.4   Platelets 150 - 400 K/uL 281  288  110       Latest Ref Rng & Units 02/08/2024    9:36 AM 02/06/2024    4:24 PM 01/27/2024    4:27 AM  CMP  Glucose 70 - 99 mg/dL 80  93  829   BUN 6 - 20 mg/dL 6  10  11    Creatinine 0.44 - 1.00 mg/dL 5.62  1.30  8.65   Sodium 135 - 145 mmol/L 136  135  136   Potassium 3.5 - 5.1 mmol/L 3.2  3.6  3.1   Chloride 98 - 111 mmol/L 105  104  104   CO2 22 - 32 mmol/L 19  20  24    Calcium  8.9 - 10.3 mg/dL 9.5  9.3  9.0   Total Protein 6.5 - 8.1 g/dL 7.6  7.4  5.9   Total Bilirubin 0.0 - 1.2 mg/dL 0.5  0.9  1.3   Alkaline Phos 38 - 126 U/L 71  73  97   AST 15 - 41 U/L 21  21  245   ALT 0 - 44 U/L 21  29  689      No results found for: "CEA1", "CEA" / No results found for: "CEA1", "CEA" No results found for: "PSA1" No results found for: "HQI696" No results found for: "CAN125"  No results found for: "TOTALPROTELP", "ALBUMINELP", "A1GS", "A2GS", "BETS", "BETA2SER", "GAMS", "MSPIKE", "SPEI" Lab Results  Component Value Date    TIBC 318 11/12/2023   TIBC 412  09/22/2022   FERRITIN 400 (H) 11/12/2023   FERRITIN 35 09/22/2022   IRONPCTSAT 20 11/12/2023   IRONPCTSAT 7 (L) 09/22/2022   No results found for: "LDH"   STUDIES:   DG Chest Port 1 View Result Date: 02/06/2024 CLINICAL DATA:  Several day history of weakness and intermittent headaches associated with nausea EXAM: PORTABLE CHEST 1 VIEW COMPARISON:  Chest radiograph dated 10/25/2023 FINDINGS: Lines/tubes: Right chest wall port tip projects over the superior cavoatrial junction. Lungs: Well inflated lungs. Increased size and conspicuity of bilateral lower lung nodules. Pleura: No pneumothorax or pleural effusion. Heart/mediastinum: The heart size and mediastinal contours are within normal limits. Bones: No acute osseous abnormality. IMPRESSION: Increased size and conspicuity of bilateral lower lung nodules, suspicious for metastatic disease. Electronically Signed   By: Limin  Xu M.D.   On: 02/06/2024 16:37   CT Head Wo Contrast Result Date: 02/06/2024 CLINICAL DATA:  New onset headache for couple of days EXAM: CT HEAD WITHOUT CONTRAST TECHNIQUE: Contiguous axial images were obtained from the base of the skull through the vertex without intravenous contrast. RADIATION DOSE REDUCTION: This exam was performed according to the departmental dose-optimization program which includes automated exposure control, adjustment of the mA and/or kV according to patient size and/or use of iterative reconstruction technique. COMPARISON:  07/02/2023 FINDINGS: Brain: No evidence of acute infarction, hemorrhage, hydrocephalus, extra-axial collection or mass lesion/mass effect. Vascular: No hyperdense vessel or unexpected calcification. Skull: Normal. Negative for fracture or focal lesion. Sinuses/Orbits: No acute finding. Other: None. IMPRESSION: No acute intracranial pathology. No noncontrast CT findings to explain headache. Electronically Signed   By: Fredricka Jenny M.D.   On: 02/06/2024 16:29    US  LIVER DOPPLER Result Date: 01/25/2024 CLINICAL DATA:  Abnormal CT.  History endometrial carcinoma EXAM: DUPLEX ULTRASOUND OF LIVER TECHNIQUE: Color and duplex Doppler ultrasound was performed to evaluate the hepatic in-flow and out-flow vessels. COMPARISON:  None Available. FINDINGS: Liver: Homogeneous hepatic parenchyma. The liver lesion seen on prior CT scan are not well defined on this ultrasound Doppler evaluation. Gallbladder is mildly distended. No shadowing stones. Slight gallbladder wall thickening of 3 mm. Nonspecific. Common duct measures 3 mm. Main Portal Vein size: 1.0 cm Portal Vein Velocities Main Prox:  31.9 cm/sec Main Mid: 19.6 cm/sec Main Dist:  22.8 cm/sec Right: 20.7 cm/sec Left: 27.7 cm/sec Hepatic Vein Velocities Right:  29.6 cm/sec Middle:  38.2 cm/sec Left:  32.6 cm/sec IVC: Present and patent with normal respiratory phasicity. Hepatic Artery Velocity:  210.5 cm/sec Splenic Vein Velocity:  16.6 cm/sec Spleen: 10.4 cm x 3.5 cm x 3.1 cm with a total volume of 59.7 cm^3 (411 cm^3 is upper limit normal) Portal Vein Occlusion/Thrombus: No Splenic Vein Occlusion/Thrombus: No Ascites: None Varices: None IMPRESSION: Preserved hepatic vasculature.  No portal vein thrombosis. The liver lesions seen on CT scan not appreciated on this examination. Please correlate with prior CT Electronically Signed   By: Adrianna Horde M.D.   On: 01/25/2024 13:10   ECHOCARDIOGRAM COMPLETE Result Date: 01/24/2024    ECHOCARDIOGRAM REPORT   Patient Name:   AKEYA RYTHER Date of Exam: 01/24/2024 Medical Rec #:  [id number]    Height:       65.0 in Accession #:    [id number]   Weight:       160.0 lb Date of Birth:  [birth year]     BSA:          1.799 m Patient Age:    53 years     BP:  141/66 mmHg Patient Gender: F            HR:           97 bpm. Exam Location:  [name] Procedure: 2D Echo, Color Doppler and Cardiac Doppler (Both Spectral and Color            Flow Doppler were utilized during procedure).  Indications:    Dyspnea  History:        Patient has no prior history of Echocardiogram examinations.                 CHF, COPD; Risk Factors:Hypertension.  Sonographer:    Astrid Blamer Referring Phys: [phone] CL[facility] L JOHNSON IMPRESSIONS  1. Left ventricular ejection fraction, by estimation, is 60 to 65%. The left ventricle has normal function. The left ventricle has no regional wall motion abnormalities. Left ventricular diastolic parameters are consistent with Grade I diastolic dysfunction (impaired relaxation).  2. Right ventricular systolic function is normal. The right ventricular size is normal.  3. The mitral valve is normal in structure. Trivial mitral valve regurgitation. No evidence of mitral stenosis.  4. The aortic valve has an indeterminant number of cusps. Aortic valve regurgitation is mild. No aortic stenosis is present. Comparison(s): No prior Echocardiogram. FINDINGS  Left Ventricle: Left ventricular ejection fraction, by estimation, is 60 to 65%. The left ventricle has normal function. The left ventricle has no regional wall motion abnormalities. The left ventricular internal cavity size was normal in size. There is  no left ventricular hypertrophy. Left ventricular diastolic parameters are consistent with Grade I diastolic dysfunction (impaired relaxation). Right Ventricle: The right ventricular size is normal. Right ventricular systolic function is normal. Left Atrium: Left atrial size was normal in size. Right Atrium: Right atrial size was normal in size. Pericardium: There is no evidence of pericardial effusion. Mitral Valve: The mitral valve is normal in structure. Mild mitral annular calcification. Trivial mitral valve regurgitation. No evidence of mitral valve stenosis. Tricuspid Valve: The tricuspid valve is normal in structure. Tricuspid valve regurgitation is not demonstrated. No evidence of tricuspid stenosis. Aortic Valve: The aortic valve has an indeterminant number of cusps. Aortic  valve regurgitation is mild. Aortic regurgitation PHT measures 787 msec. No aortic stenosis is present. Aortic valve mean gradient measures 5.0 mmHg. Aortic valve peak gradient measures 9.2 mmHg. Aortic valve area, by VTI measures 2.87 cm. Pulmonic Valve: The pulmonic valve was normal in structure. Pulmonic valve regurgitation is not visualized. No evidence of pulmonic stenosis. Aorta: The aortic root is normal in size and structure. Venous: The inferior vena cava was not well visualized. IAS/Shunts: No atrial level shunt detected by color flow Doppler.  LEFT VENTRICLE PLAX 2D LVIDd:         4.60 cm   Diastology LVIDs:         3.00 cm   LV e' medial:    6.09 cm/s LV PW:         1.10 cm   LV E/e' medial:  14.3 LV IVS:        1.10 cm   LV e' lateral:   7.72 cm/s LVOT diam:     2.00 cm   LV E/e' lateral: 11.3 LV SV:         73 LV SV Index:   41 LVOT Area:     3.14 cm  RIGHT VENTRICLE RV S prime:     12.40 cm/s TAPSE (M-mode): 2.1 cm LEFT ATRIUM  Index        RIGHT ATRIUM          Index LA Vol (A2C):   43.3 ml 24.07 ml/m  RA Area:     8.56 cm LA Vol (A4C):   37.8 ml 21.01 ml/m  RA Volume:   13.70 ml 7.61 ml/m LA Biplane Vol: 43.3 ml 24.07 ml/m  AORTIC VALVE AV Area (Vmax):    2.65 cm AV Area (Vmean):   2.67 cm AV Area (VTI):     2.87 cm AV Vmax:           152.00 cm/s AV Vmean:          107.000 cm/s AV VTI:            0.254 m AV Peak Grad:      9.2 mmHg AV Mean Grad:      5.0 mmHg LVOT Vmax:         128.00 cm/s LVOT Vmean:        91.100 cm/s LVOT VTI:          0.232 m LVOT/AV VTI ratio: 0.91 AI PHT:            787 msec  AORTA Ao Root diam: 2.80 cm MITRAL VALVE MV Area (PHT): 3.12 cm     SHUNTS MV Decel Time: 243 msec     Systemic VTI:  0.23 m MV E velocity: 87.00 cm/s   Systemic Diam: 2.00 cm MV A velocity: 102.00 cm/s MV E/A ratio:  0.85 Alexandria Angel MD Electronically signed by Alexandria Angel MD Signature Date/Time: 01/24/2024/4:48:40 PM    Final    CT ABDOMEN PELVIS W CONTRAST Result Date:  01/23/2024 CLINICAL DATA:  Nausea, vomiting, diarrhea, abdominal pain EXAM: CT ABDOMEN AND PELVIS WITH CONTRAST TECHNIQUE: Multidetector CT imaging of the abdomen and pelvis was performed using the standard protocol following bolus administration of intravenous contrast. RADIATION DOSE REDUCTION: This exam was performed according to the departmental dose-optimization program which includes automated exposure control, adjustment of the mA and/or kV according to patient size and/or use of iterative reconstruction technique. CONTRAST:  80mL OMNIPAQUE  IOHEXOL  350 MG/ML SOLN COMPARISON:  CT 10/19/2023 FINDINGS: Lower chest: 1.8 cm subpleural nodule in the right middle lobe and partially visualized 2.2 cm nodule in the lingula (series 3/image 1). 1.7 cm nodule in the right middle lobe (series 3/image 3). These of increased compared to CT chest 10/19/2023. Hepatobiliary: Increased size of the hypoattenuating lesions in the liver. For example in the right hepatic lobe medially the lesion measures 1.5 cm, previously 0.7 cm (series 2/image 20); and the 1.6 cm lesion in the left hepatic lobe (2/16) previously measured 0.7 cm. Gallbladder and biliary tree are unremarkable. Pancreas: Unremarkable. Spleen: Unremarkable. Adrenals/Urinary Tract: Stable adrenal glands. No urinary calculi or hydronephrosis. Unremarkable bladder. Stomach/Bowel: Normal caliber large and small bowel. No bowel wall thickening. Stomach and appendix are within normal limits. Vascular/Lymphatic: Aortic atherosclerotic calcification. Unchanged left para-aortic lymph node measuring 1.7 x 1.1 cm (series 2/image 31). Reproductive: Increased distension of the endometrial canal with fluid now measuring 6.1 cm. Irregular nodular thickening and hyperenhancement surrounding the endometrial canal compatible with known carcinoma. No adnexal mass. Other: No free intraperitoneal fluid or air. Musculoskeletal: No acute fracture or destructive osseous lesion. Avascular  necrosis right femoral head. IMPRESSION: 1. Progression of endometrial carcinoma since 10/19/2023 with increased distension of the endometrial canal now measuring 6.1 cm. 2. Worsening hepatic and pulmonary metastases. Similar left para-aortic lymphadenopathy. Aortic Atherosclerosis (ICD10-I70.0). Electronically Signed  By: Rozell Cornet M.D.   On: 01/23/2024 23:23

## 2024-02-08 NOTE — Progress Notes (Signed)
 Patient presents today for C1D1 Abraxaneinfusion. Patient is in satisfactory condition with no new complaints voiced.  Vital signs are stable.  Labs reviewed by Dr. Cheree Cords during the office visit and all labs are within treatment parameters. Patient will also receive house fluids. We will proceed with treatment per MD orders.   Treatment given today per MD orders. Tolerated infusion without adverse affects. Vital signs stable. No complaints at this time. Discharged from clinic ambulatory in stable condition. Alert and oriented x 3. F/U with [facility] as scheduled.

## 2024-02-08 NOTE — Progress Notes (Signed)
 Patient has been examined by Dr. Cheree Cords. Vital signs and labs have been reviewed by MD - ANC, Creatinine, LFTs, hemoglobin, and platelets are within treatment parameters per M.D. - pt may proceed with treatment.  Tx change to Abraxane  D1, 8, 15 every 28 days. Primary RN and pharmacy notified.

## 2024-02-08 NOTE — Patient Instructions (Signed)
 [facility] [place] - A DEPT OF [place]  Discharge Instructions: Thank you for choosing [facility] to provide your oncology and hematology care.  If you have a lab appointment with the [facility] - please note that after April 8th, 2024, all labs will be drawn in the [facility].  You do not have to check in or register with the main entrance as you have in the past but will complete your check-in in the [facility].  Wear comfortable clothing and clothing appropriate for easy access to any Portacath or PICC line.   We strive to give you quality time with your provider. You may need to reschedule your appointment if you arrive late (15 or more minutes).  Arriving late affects you and other patients whose appointments are after yours.  Also, if you miss three or more appointments without notifying the office, you may be dismissed from the clinic at the provider's discretion.      For prescription refill requests, have your pharmacy contact our office and allow 72 hours for refills to be completed.    Today you received the following chemotherapy and/or immunotherapy agents C1D1 Abraxane    To help prevent nausea and vomiting after your treatment, we encourage you to take your nausea medication as directed.  Paclitaxel  Nanoparticle Albumin-Bound Injection What is this medication? NANOPARTICLE ALBUMIN-BOUND PACLITAXEL  (Na no PAHR ti kuhl al BYOO muhn-bound PAK li TAX el) treats some types of cancer. It works by slowing down the growth of cancer cells. This medicine may be used for other purposes; ask your health care provider or pharmacist if you have questions. COMMON BRAND NAME(S): Abraxane  What should I tell my care team before I take this medication? They need to know if you have any of these conditions: Liver disease Low white blood cell levels An unusual or allergic reaction to paclitaxel , albumin, other medications, foods, dyes, or  preservatives If you or your partner are pregnant or trying to get pregnant Breast-feeding How should I use this medication? This medication is injected into a vein. It is given by your care team in a hospital or clinic setting. Talk to your care team about the use of this medication in children. Special care may be needed. Overdosage: If you think you have taken too much of this medicine contact a poison control [facility] or emergency room at once. NOTE: This medicine is only for you. Do not share this medicine with others. What if I miss a dose? Keep appointments for follow-up doses. It is important not to miss your dose. Call your care team if you are unable to keep an appointment. What may interact with this medication? Other medications may affect the way this medication works. Talk with your care team about all of the medications you take. They may suggest changes to your treatment plan to lower the risk of side effects and to make sure your medications work as intended. This list may not describe all possible interactions. Give your health care provider a list of all the medicines, herbs, non-prescription drugs, or dietary supplements you use. Also tell them if you smoke, drink alcohol , or use illegal drugs. Some items may interact with your medicine. What should I watch for while using this medication? Your condition will be monitored carefully while you are receiving this medication. You may need blood work while taking this medication. This medication may make you feel generally unwell. This is not uncommon as chemotherapy  can affect healthy cells as well as cancer cells. Report any side effects. Continue your course of treatment even though you feel ill unless your care team tells you to stop. This medication can cause serious allergic reactions. To reduce the risk, your care team may give you other medications to take before receiving this one. Be sure to follow the directions from your care  team. This medication may increase your risk of getting an infection. Call your care team for advice if you get a fever, chills, sore throat, or other symptoms of a cold or flu. Do not treat yourself. Try to avoid being around people who are sick. This medication may increase your risk to bruise or bleed. Call your care team if you notice any unusual bleeding. Be careful brushing or flossing your teeth or using a toothpick because you may get an infection or bleed more easily. If you have any dental work done, tell your dentist you are receiving this medication. Talk to your care team if you or your partner may be pregnant. Serious birth defects can occur if you take this medication during pregnancy and for 6 months after the last dose. You will need a negative pregnancy test before starting this medication. Contraception is recommended while taking this medication and for 6 months after the last dose. Your care team can help you find the option that works for you. If your partner can get pregnant, use a condom during sex while taking this medication and for 3 months after the last dose. Do not breastfeed while taking this medication and for 2 weeks after the last dose. This medication may cause infertility. Talk to your care team if you are concerned about your fertility. What side effects may I notice from receiving this medication? Side effects that you should report to your care team as soon as possible: Allergic reactions--skin rash, itching, hives, swelling of the face, lips, tongue, or throat Dry cough, shortness of breath or trouble breathing Infection--fever, chills, cough, sore throat, wounds that don't heal, pain or trouble when passing urine, general feeling of discomfort or being unwell Low red blood cell level--unusual weakness or fatigue, dizziness, headache, trouble breathing Pain, tingling, or numbness in the hands or feet Stomach pain, unusual weakness or fatigue, nausea, vomiting,  diarrhea, or fever that lasts longer than expected Unusual bruising or bleeding Side effects that usually do not require medical attention (report to your care team if they continue or are bothersome): Diarrhea Fatigue Hair loss Loss of appetite Nausea Vomiting This list may not describe all possible side effects. Call your doctor for medical advice about side effects. You may report side effects to FDA at 1-800-FDA-1088. Where should I keep my medication? This medication is given in a hospital or clinic. It will not be stored at home. NOTE: This sheet is a summary. It may not cover all possible information. If you have questions about this medicine, talk to your doctor, pharmacist, or health care provider.  2024 Elsevier/Gold Standard (2022-01-02 00:00:00)  BELOW ARE SYMPTOMS THAT SHOULD BE REPORTED IMMEDIATELY: *FEVER GREATER THAN 100.4 F (38 C) OR HIGHER *CHILLS OR SWEATING *NAUSEA AND VOMITING THAT IS NOT CONTROLLED WITH YOUR NAUSEA MEDICATION *UNUSUAL SHORTNESS OF BREATH *UNUSUAL BRUISING OR BLEEDING *URINARY PROBLEMS (pain or burning when urinating, or frequent urination) *BOWEL PROBLEMS (unusual diarrhea, constipation, pain near the anus) TENDERNESS IN MOUTH AND THROAT WITH OR WITHOUT PRESENCE OF ULCERS (sore throat, sores in mouth, or a toothache) UNUSUAL RASH, SWELLING OR PAIN  UNUSUAL VAGINAL DISCHARGE OR ITCHING   Items with * indicate a potential emergency and should be followed up as soon as possible or go to the Emergency Department if any problems should occur.  Please show the CHEMOTHERAPY ALERT CARD or IMMUNOTHERAPY ALERT CARD at check-in to the Emergency Department and triage nurse.  Should you have questions after your visit or need to cancel or reschedule your appointment, please contact [facility] [place] - A DEPT OF [facility] [phone]  and follow the prompts.  Office hours are 8:00 a.m. to 4:30 p.m. Monday - Friday. Please note  that voicemails left after 4:00 p.m. may not be returned until the following business day.  We are closed weekends and major holidays. You have access to a nurse at all times for urgent questions. Please call the main number to the clinic [phone] and follow the prompts.  For any non-urgent questions, you may also contact your provider using MyChart. We now offer e-Visits for anyone 32 and older to request care online for non-urgent symptoms. For details visit [email or web address].   Also download the MyChart app! Go to the app store, search "MyChart", open the app, select [place], and log in with your MyChart username and password.

## 2024-02-08 NOTE — Patient Instructions (Signed)
 [facility] at [facility] Discharge Instructions   You were seen and examined today by Dr. Cheree Cords.  He reviewed the results of your lab work which are normal/stable.   Dr. Linnell Richardson reviewed the results of your most recent scan in the hospital. It is showing the medication (Tivdak ) is not helping to control your cancer as the spots in the liver have grown. We will need to switch your treatment. The treatment Dr. Linnell Richardson wants to give you is called Abraxane . It is given 3 weeks in a row with one week off.   We will proceed with your treatment today.   Return as scheduled.    Thank you for choosing [facility] at [facility] to provide your oncology and hematology care.  To afford each patient quality time with our provider, please arrive at least 15 minutes before your scheduled appointment time.   If you have a lab appointment with the Cancer Center please come in thru the Main Entrance and check in at the main information desk.  You need to re-schedule your appointment should you arrive 10 or more minutes late.  We strive to give you quality time with our providers, and arriving late affects you and other patients whose appointments are after yours.  Also, if you no show three or more times for appointments you may be dismissed from the clinic at the providers discretion.     Again, thank you for choosing [facility].  Our hope is that these requests will decrease the amount of time that you wait before being seen by our physicians.       _____________________________________________________________  Should you have questions after your visit to [facility], please contact our office at [phone] and follow the prompts.  Our office hours are 8:00 a.m. and 4:30 p.m. Monday - Friday.  Please note that voicemails left after 4:00 p.m. may not be returned until the following business day.  We are closed weekends and major holidays.   You do have access to a nurse 24-7, just call the main number to the clinic [phone] and do not press any options, hold on the line and a nurse will answer the phone.    For prescription refill requests, have your pharmacy contact our office and allow 72 hours.    Due to Covid, you will need to wear a mask upon entering the hospital. If you do not have a mask, a mask will be given to you at the Main Entrance upon arrival. For doctor visits, patients may have 1 support person age 49 or older with them. For treatment visits, patients can not have anyone with them due to social distancing guidelines and our immunocompromised population.

## 2024-02-09 ENCOUNTER — Telehealth: Payer: Self-pay

## 2024-02-09 ENCOUNTER — Other Ambulatory Visit: Payer: Self-pay

## 2024-02-09 DIAGNOSIS — J449 Chronic obstructive pulmonary disease, unspecified: Secondary | ICD-10-CM | POA: Diagnosis not present

## 2024-02-09 NOTE — Telephone Encounter (Signed)
 Patient called for 24hr follow-up. No answer from phone number.

## 2024-02-10 DIAGNOSIS — J449 Chronic obstructive pulmonary disease, unspecified: Secondary | ICD-10-CM | POA: Diagnosis not present

## 2024-02-11 ENCOUNTER — Ambulatory Visit: Admitting: Internal Medicine

## 2024-02-11 ENCOUNTER — Encounter: Payer: Self-pay | Admitting: Internal Medicine

## 2024-02-11 DIAGNOSIS — J449 Chronic obstructive pulmonary disease, unspecified: Secondary | ICD-10-CM | POA: Diagnosis not present

## 2024-02-11 DIAGNOSIS — Z419 Encounter for procedure for purposes other than remedying health state, unspecified: Secondary | ICD-10-CM | POA: Diagnosis not present

## 2024-02-11 LAB — CULTURE, BLOOD (ROUTINE X 2)
Special Requests: ADEQUATE
Special Requests: ADEQUATE

## 2024-02-12 ENCOUNTER — Encounter: Payer: Self-pay | Admitting: Hematology

## 2024-02-12 DIAGNOSIS — J449 Chronic obstructive pulmonary disease, unspecified: Secondary | ICD-10-CM | POA: Diagnosis not present

## 2024-02-15 ENCOUNTER — Inpatient Hospital Stay

## 2024-02-15 VITALS — BP 133/79 | HR 89 | Temp 98.1°F | Resp 18

## 2024-02-15 DIAGNOSIS — Z95828 Presence of other vascular implants and grafts: Secondary | ICD-10-CM

## 2024-02-15 DIAGNOSIS — C538 Malignant neoplasm of overlapping sites of cervix uteri: Secondary | ICD-10-CM

## 2024-02-15 DIAGNOSIS — J449 Chronic obstructive pulmonary disease, unspecified: Secondary | ICD-10-CM | POA: Diagnosis not present

## 2024-02-15 DIAGNOSIS — Z7689 Persons encountering health services in other specified circumstances: Secondary | ICD-10-CM | POA: Diagnosis not present

## 2024-02-15 DIAGNOSIS — Z5111 Encounter for antineoplastic chemotherapy: Secondary | ICD-10-CM | POA: Diagnosis not present

## 2024-02-15 LAB — CBC WITH DIFFERENTIAL/PLATELET
Abs Immature Granulocytes: 0.08 10*3/uL — ABNORMAL HIGH (ref 0.00–0.07)
Basophils Absolute: 0 10*3/uL (ref 0.0–0.1)
Basophils Relative: 0 %
Eosinophils Absolute: 0.1 10*3/uL (ref 0.0–0.5)
Eosinophils Relative: 1 %
HCT: 28.8 % — ABNORMAL LOW (ref 36.0–46.0)
Hemoglobin: 9.7 g/dL — ABNORMAL LOW (ref 12.0–15.0)
Immature Granulocytes: 1 %
Lymphocytes Relative: 23 %
Lymphs Abs: 2.3 10*3/uL (ref 0.7–4.0)
MCH: 33.7 pg (ref 26.0–34.0)
MCHC: 33.7 g/dL (ref 30.0–36.0)
MCV: 100 fL (ref 80.0–100.0)
Monocytes Absolute: 0.6 10*3/uL (ref 0.1–1.0)
Monocytes Relative: 6 %
Neutro Abs: 6.9 10*3/uL (ref 1.7–7.7)
Neutrophils Relative %: 69 %
Platelets: 213 10*3/uL (ref 150–400)
RBC: 2.88 MIL/uL — ABNORMAL LOW (ref 3.87–5.11)
RDW: 15.9 % — ABNORMAL HIGH (ref 11.5–15.5)
WBC: 9.9 10*3/uL (ref 4.0–10.5)
nRBC: 0 % (ref 0.0–0.2)

## 2024-02-15 LAB — COMPREHENSIVE METABOLIC PANEL WITH GFR
ALT: 10 U/L (ref 0–44)
AST: 17 U/L (ref 15–41)
Albumin: 2.9 g/dL — ABNORMAL LOW (ref 3.5–5.0)
Alkaline Phosphatase: 53 U/L (ref 38–126)
Anion gap: 8 (ref 5–15)
BUN: 16 mg/dL (ref 6–20)
CO2: 22 mmol/L (ref 22–32)
Calcium: 8.8 mg/dL — ABNORMAL LOW (ref 8.9–10.3)
Chloride: 107 mmol/L (ref 98–111)
Creatinine, Ser: 1 mg/dL (ref 0.44–1.00)
GFR, Estimated: >60 mL/min (ref >60)
Glucose, Bld: 104 mg/dL — ABNORMAL HIGH (ref 70–99)
Potassium: 3.9 mmol/L (ref 3.5–5.1)
Sodium: 137 mmol/L (ref 135–145)
Total Bilirubin: 0.3 mg/dL (ref 0.0–1.2)
Total Protein: 6.7 g/dL (ref 6.5–8.1)

## 2024-02-15 LAB — MAGNESIUM: Magnesium: 1.4 mg/dL — ABNORMAL LOW (ref 1.7–2.4)

## 2024-02-15 MED ORDER — MAGNESIUM SULFATE 4 GM/100ML IV SOLN
4.0000 g | Freq: Once | INTRAVENOUS | Status: AC
  Administered 2024-02-15: 4 g via INTRAVENOUS
  Filled 2024-02-15: qty 100

## 2024-02-15 MED ORDER — SODIUM CHLORIDE 0.9% FLUSH
10.0000 mL | INTRAVENOUS | Status: DC | PRN
  Administered 2024-02-15: 10 mL

## 2024-02-15 MED ORDER — ONDANSETRON HCL 4 MG/2ML IJ SOLN
8.0000 mg | Freq: Once | INTRAMUSCULAR | Status: AC
  Administered 2024-02-15: 8 mg via INTRAVENOUS
  Filled 2024-02-15: qty 4

## 2024-02-15 MED ORDER — SODIUM CHLORIDE 0.9 % IV SOLN: INTRAVENOUS | Status: DC

## 2024-02-15 MED ORDER — HEPARIN SOD (PORK) LOCK FLUSH 100 UNIT/ML IV SOLN
500.0000 [IU] | Freq: Once | INTRAVENOUS | Status: AC | PRN
  Administered 2024-02-15: 500 [IU]

## 2024-02-15 MED ORDER — PACLITAXEL PROTEIN-BOUND CHEMO INJECTION 100 MG
100.0000 mg/m2 | Freq: Once | INTRAVENOUS | Status: AC
  Administered 2024-02-15: 175 mg via INTRAVENOUS
  Filled 2024-02-15: qty 35

## 2024-02-15 MED ORDER — SODIUM CHLORIDE 0.9% FLUSH
10.0000 mL | Freq: Once | INTRAVENOUS | Status: AC
  Administered 2024-02-15: 10 mL via INTRAVENOUS

## 2024-02-15 NOTE — Patient Instructions (Signed)
 [facility] [place] - A DEPT OF [place]. [facility]  Discharge Instructions: Thank you for choosing [facility] to provide your oncology and hematology care.  If you have a lab appointment with the Cancer Center - please note that after April 8th, 2024, all labs will be drawn in the cancer center.  You do not have to check in or register with the main entrance as you have in the past but will complete your check-in in the cancer center.  Wear comfortable clothing and clothing appropriate for easy access to any Portacath or PICC line.   We strive to give you quality time with your provider. You may need to reschedule your appointment if you arrive late (15 or more minutes).  Arriving late affects you and other patients whose appointments are after yours.  Also, if you miss three or more appointments without notifying the office, you may be dismissed from the clinic at the provider's discretion.      For prescription refill requests, have your pharmacy contact our office and allow 72 hours for refills to be completed.    Today you received the following chemotherapy and/or immunotherapy agents Abraxane       To help prevent nausea and vomiting after your treatment, we encourage you to take your nausea medication as directed.  BELOW ARE SYMPTOMS THAT SHOULD BE REPORTED IMMEDIATELY: *FEVER GREATER THAN 100.4 F (38 C) OR HIGHER *CHILLS OR SWEATING *NAUSEA AND VOMITING THAT IS NOT CONTROLLED WITH YOUR NAUSEA MEDICATION *UNUSUAL SHORTNESS OF BREATH *UNUSUAL BRUISING OR BLEEDING *URINARY PROBLEMS (pain or burning when urinating, or frequent urination) *BOWEL PROBLEMS (unusual diarrhea, constipation, pain near the anus) TENDERNESS IN MOUTH AND THROAT WITH OR WITHOUT PRESENCE OF ULCERS (sore throat, sores in mouth, or a toothache) UNUSUAL RASH, SWELLING OR PAIN  UNUSUAL VAGINAL DISCHARGE OR ITCHING   Items with * indicate a potential emergency and should be followed up  as soon as possible or go to the Emergency Department if any problems should occur.  Please show the CHEMOTHERAPY ALERT CARD or IMMUNOTHERAPY ALERT CARD at check-in to the Emergency Department and triage nurse.  Should you have questions after your visit or need to cancel or reschedule your appointment, please contact [facility] [place] - A DEPT OF [facility] [phone]  and follow the prompts.  Office hours are 8:00 a.m. to 4:30 p.m. Monday - Friday. Please note that voicemails left after 4:00 p.m. may not be returned until the following business day.  We are closed weekends and major holidays. You have access to a nurse at all times for urgent questions. Please call the main number to the clinic [phone] and follow the prompts.  For any non-urgent questions, you may also contact your provider using MyChart. We now offer e-Visits for anyone 15 and older to request care online for non-urgent symptoms. For details visit [email or web address].   Also download the MyChart app! Go to the app store, search MyChart, open the app, select [place], and log in with your MyChart username and password.

## 2024-02-15 NOTE — Progress Notes (Signed)
 Patient presents today for Abraxane  infusion per providers order.  Vital signs and labs within parameters fro treatment.  Magnesium  level noted to be 1.4.  Patient will receive 4 grams IV Magnesium  per providers standing order with treatment.  Treatment given today per MD orders.  Tolerated infusion without adverse affects.  Vital signs stable.  No complaints at this time.  Discharge from clinic ambulatory in stable condition.  Alert and oriented X 3.  Follow up with [facility] as scheduled.

## 2024-02-16 DIAGNOSIS — J449 Chronic obstructive pulmonary disease, unspecified: Secondary | ICD-10-CM | POA: Diagnosis not present

## 2024-02-17 DIAGNOSIS — J449 Chronic obstructive pulmonary disease, unspecified: Secondary | ICD-10-CM | POA: Diagnosis not present

## 2024-02-18 DIAGNOSIS — J449 Chronic obstructive pulmonary disease, unspecified: Secondary | ICD-10-CM | POA: Diagnosis not present

## 2024-02-19 DIAGNOSIS — J449 Chronic obstructive pulmonary disease, unspecified: Secondary | ICD-10-CM | POA: Diagnosis not present

## 2024-02-20 DIAGNOSIS — J449 Chronic obstructive pulmonary disease, unspecified: Secondary | ICD-10-CM | POA: Diagnosis not present

## 2024-02-22 ENCOUNTER — Inpatient Hospital Stay

## 2024-02-22 DIAGNOSIS — J449 Chronic obstructive pulmonary disease, unspecified: Secondary | ICD-10-CM | POA: Diagnosis not present

## 2024-02-23 ENCOUNTER — Ambulatory Visit

## 2024-02-23 ENCOUNTER — Other Ambulatory Visit: Payer: Self-pay

## 2024-02-23 DIAGNOSIS — J449 Chronic obstructive pulmonary disease, unspecified: Secondary | ICD-10-CM | POA: Diagnosis not present

## 2024-02-24 ENCOUNTER — Other Ambulatory Visit: Payer: Self-pay | Admitting: Hematology

## 2024-02-24 ENCOUNTER — Inpatient Hospital Stay

## 2024-02-24 VITALS — BP 113/83 | HR 78 | Temp 97.6°F | Resp 18

## 2024-02-24 DIAGNOSIS — J449 Chronic obstructive pulmonary disease, unspecified: Secondary | ICD-10-CM | POA: Diagnosis not present

## 2024-02-24 DIAGNOSIS — Z5111 Encounter for antineoplastic chemotherapy: Secondary | ICD-10-CM | POA: Diagnosis not present

## 2024-02-24 DIAGNOSIS — Z95828 Presence of other vascular implants and grafts: Secondary | ICD-10-CM

## 2024-02-24 DIAGNOSIS — Z7689 Persons encountering health services in other specified circumstances: Secondary | ICD-10-CM | POA: Diagnosis not present

## 2024-02-24 DIAGNOSIS — C538 Malignant neoplasm of overlapping sites of cervix uteri: Secondary | ICD-10-CM

## 2024-02-24 LAB — COMPREHENSIVE METABOLIC PANEL WITH GFR
ALT: 10 U/L (ref 0–44)
AST: 16 U/L (ref 15–41)
Albumin: 3 g/dL — ABNORMAL LOW (ref 3.5–5.0)
Alkaline Phosphatase: 39 U/L (ref 38–126)
Anion gap: 9 (ref 5–15)
BUN: 25 mg/dL — ABNORMAL HIGH (ref 6–20)
CO2: 21 mmol/L — ABNORMAL LOW (ref 22–32)
Calcium: 8.8 mg/dL — ABNORMAL LOW (ref 8.9–10.3)
Chloride: 108 mmol/L (ref 98–111)
Creatinine, Ser: 0.9 mg/dL (ref 0.44–1.00)
GFR, Estimated: >60 mL/min (ref >60)
Glucose, Bld: 102 mg/dL — ABNORMAL HIGH (ref 70–99)
Potassium: 4.2 mmol/L (ref 3.5–5.1)
Sodium: 138 mmol/L (ref 135–145)
Total Bilirubin: 0.3 mg/dL (ref 0.0–1.2)
Total Protein: 6.6 g/dL (ref 6.5–8.1)

## 2024-02-24 LAB — CBC WITH DIFFERENTIAL/PLATELET
Abs Immature Granulocytes: 0.23 10*3/uL — ABNORMAL HIGH (ref 0.00–0.07)
Basophils Absolute: 0 10*3/uL (ref 0.0–0.1)
Basophils Relative: 0 %
Eosinophils Absolute: 0 10*3/uL (ref 0.0–0.5)
Eosinophils Relative: 0 %
HCT: 28.5 % — ABNORMAL LOW (ref 36.0–46.0)
Hemoglobin: 9.6 g/dL — ABNORMAL LOW (ref 12.0–15.0)
Immature Granulocytes: 3 %
Lymphocytes Relative: 26 %
Lymphs Abs: 1.9 10*3/uL (ref 0.7–4.0)
MCH: 33.7 pg (ref 26.0–34.0)
MCHC: 33.7 g/dL (ref 30.0–36.0)
MCV: 100 fL (ref 80.0–100.0)
Monocytes Absolute: 1.2 10*3/uL — ABNORMAL HIGH (ref 0.1–1.0)
Monocytes Relative: 16 %
Neutro Abs: 4.2 10*3/uL (ref 1.7–7.7)
Neutrophils Relative %: 55 %
Platelets: 272 10*3/uL (ref 150–400)
RBC: 2.85 MIL/uL — ABNORMAL LOW (ref 3.87–5.11)
RDW: 17.6 % — ABNORMAL HIGH (ref 11.5–15.5)
WBC: 7.5 10*3/uL (ref 4.0–10.5)
nRBC: 1.3 % — ABNORMAL HIGH (ref 0.0–0.2)

## 2024-02-24 LAB — MAGNESIUM: Magnesium: 1.4 mg/dL — ABNORMAL LOW (ref 1.7–2.4)

## 2024-02-24 MED ORDER — SODIUM CHLORIDE 0.9% FLUSH
10.0000 mL | Freq: Once | INTRAVENOUS | Status: AC
  Administered 2024-02-24: 10 mL via INTRAVENOUS

## 2024-02-24 MED ORDER — MAGNESIUM SULFATE 4 GM/100ML IV SOLN
4.0000 g | Freq: Once | INTRAVENOUS | Status: AC
  Administered 2024-02-24: 4 g via INTRAVENOUS
  Filled 2024-02-24: qty 100

## 2024-02-24 MED ORDER — ALUM & MAG HYDROXIDE-SIMETH 200-200-20 MG/5ML PO SUSP
30.0000 mL | Freq: Once | ORAL | Status: AC
  Administered 2024-02-24: 30 mL via ORAL
  Filled 2024-02-24: qty 30

## 2024-02-24 MED ORDER — SODIUM CHLORIDE 0.9 % IV SOLN: INTRAVENOUS | Status: DC

## 2024-02-24 MED ORDER — OXYCODONE HCL 5 MG PO TABS
5.0000 mg | ORAL_TABLET | Freq: Once | ORAL | Status: AC
  Administered 2024-02-24: 5 mg via ORAL
  Filled 2024-02-24: qty 1

## 2024-02-24 MED ORDER — ONDANSETRON HCL 4 MG/2ML IJ SOLN
8.0000 mg | Freq: Once | INTRAMUSCULAR | Status: AC
  Administered 2024-02-24: 8 mg via INTRAVENOUS
  Filled 2024-02-24: qty 4

## 2024-02-24 MED ORDER — PACLITAXEL PROTEIN-BOUND CHEMO INJECTION 100 MG
100.0000 mg/m2 | Freq: Once | INTRAVENOUS | Status: AC
  Administered 2024-02-24: 175 mg via INTRAVENOUS
  Filled 2024-02-24: qty 35

## 2024-02-24 MED ORDER — OXYCODONE HCL 5 MG PO TABS: 5.0000 mg | ORAL_TABLET | Freq: Four times a day (QID) | ORAL | 0 refills | Status: DC | PRN

## 2024-02-24 MED ORDER — HEPARIN SOD (PORK) LOCK FLUSH 100 UNIT/ML IV SOLN
500.0000 [IU] | Freq: Once | INTRAVENOUS | Status: AC | PRN
  Administered 2024-02-24: 500 [IU]

## 2024-02-24 NOTE — Patient Instructions (Signed)
 [facility] [place] - A DEPT OF [place]. [facility]  Discharge Instructions: Thank you for choosing [facility] to provide your oncology and hematology care.  If you have a lab appointment with the Cancer Center - please note that after April 8th, 2024, all labs will be drawn in the cancer center.  You do not have to check in or register with the main entrance as you have in the past but will complete your check-in in the cancer center.  Wear comfortable clothing and clothing appropriate for easy access to any Portacath or PICC line.   We strive to give you quality time with your provider. You may need to reschedule your appointment if you arrive late (15 or more minutes).  Arriving late affects you and other patients whose appointments are after yours.  Also, if you miss three or more appointments without notifying the office, you may be dismissed from the clinic at the provider's discretion.      For prescription refill requests, have your pharmacy contact our office and allow 72 hours for refills to be completed.    Today you received the following chemotherapy and/or immunotherapy agents Abraxane       To help prevent nausea and vomiting after your treatment, we encourage you to take your nausea medication as directed.  BELOW ARE SYMPTOMS THAT SHOULD BE REPORTED IMMEDIATELY: *FEVER GREATER THAN 100.4 F (38 C) OR HIGHER *CHILLS OR SWEATING *NAUSEA AND VOMITING THAT IS NOT CONTROLLED WITH YOUR NAUSEA MEDICATION *UNUSUAL SHORTNESS OF BREATH *UNUSUAL BRUISING OR BLEEDING *URINARY PROBLEMS (pain or burning when urinating, or frequent urination) *BOWEL PROBLEMS (unusual diarrhea, constipation, pain near the anus) TENDERNESS IN MOUTH AND THROAT WITH OR WITHOUT PRESENCE OF ULCERS (sore throat, sores in mouth, or a toothache) UNUSUAL RASH, SWELLING OR PAIN  UNUSUAL VAGINAL DISCHARGE OR ITCHING   Items with * indicate a potential emergency and should be followed up  as soon as possible or go to the Emergency Department if any problems should occur.  Please show the CHEMOTHERAPY ALERT CARD or IMMUNOTHERAPY ALERT CARD at check-in to the Emergency Department and triage nurse.  Should you have questions after your visit or need to cancel or reschedule your appointment, please contact [facility] [place] - A DEPT OF [facility] [phone]  and follow the prompts.  Office hours are 8:00 a.m. to 4:30 p.m. Monday - Friday. Please note that voicemails left after 4:00 p.m. may not be returned until the following business day.  We are closed weekends and major holidays. You have access to a nurse at all times for urgent questions. Please call the main number to the clinic [phone] and follow the prompts.  For any non-urgent questions, you may also contact your provider using MyChart. We now offer e-Visits for anyone 15 and older to request care online for non-urgent symptoms. For details visit [email or web address].   Also download the MyChart app! Go to the app store, search MyChart, open the app, select [place], and log in with your MyChart username and password.

## 2024-02-24 NOTE — Progress Notes (Signed)
 Per patient she has been out of pain medicine for a couple of days now. Her pain is 6 out 10 in her legs and is asking for some pain medications while here. She has also been experiencing heartburn for 3-4 days now. MD and treatment team made aware.   Per MD to proceed with treatment and patient may receive oxycodone  5mg  PO and Maalox 30 mL while here. Pain medication refill sent and signed by MD. Pt updated and agrees to plan.   Mag 1.3. pt will receive Magnesium  4g IV per standing orders.   Patient tolerated chemotherapy with no complaints voiced.  Side effects with management reviewed with understanding verbalized.  Port site clean and dry with no bruising or swelling noted at site.  Good blood return noted before and after administration of chemotherapy.  Band aid applied.  Patient left in satisfactory condition with VSS and no s/s of distress noted.   Merial Moritz

## 2024-02-25 DIAGNOSIS — J449 Chronic obstructive pulmonary disease, unspecified: Secondary | ICD-10-CM | POA: Diagnosis not present

## 2024-02-26 DIAGNOSIS — J449 Chronic obstructive pulmonary disease, unspecified: Secondary | ICD-10-CM | POA: Diagnosis not present

## 2024-02-27 DIAGNOSIS — J449 Chronic obstructive pulmonary disease, unspecified: Secondary | ICD-10-CM | POA: Diagnosis not present

## 2024-02-29 DIAGNOSIS — J449 Chronic obstructive pulmonary disease, unspecified: Secondary | ICD-10-CM | POA: Diagnosis not present

## 2024-02-29 DIAGNOSIS — C539 Malignant neoplasm of cervix uteri, unspecified: Secondary | ICD-10-CM | POA: Diagnosis not present

## 2024-03-01 DIAGNOSIS — J449 Chronic obstructive pulmonary disease, unspecified: Secondary | ICD-10-CM | POA: Diagnosis not present

## 2024-03-02 DIAGNOSIS — J449 Chronic obstructive pulmonary disease, unspecified: Secondary | ICD-10-CM | POA: Diagnosis not present

## 2024-03-03 DIAGNOSIS — J449 Chronic obstructive pulmonary disease, unspecified: Secondary | ICD-10-CM | POA: Diagnosis not present

## 2024-03-04 DIAGNOSIS — J449 Chronic obstructive pulmonary disease, unspecified: Secondary | ICD-10-CM | POA: Diagnosis not present

## 2024-03-05 DIAGNOSIS — J449 Chronic obstructive pulmonary disease, unspecified: Secondary | ICD-10-CM | POA: Diagnosis not present

## 2024-03-07 ENCOUNTER — Inpatient Hospital Stay

## 2024-03-07 ENCOUNTER — Inpatient Hospital Stay: Attending: Hematology | Admitting: Hematology

## 2024-03-07 VITALS — BP 136/81 | HR 93 | Resp 19

## 2024-03-07 DIAGNOSIS — Z95828 Presence of other vascular implants and grafts: Secondary | ICD-10-CM

## 2024-03-07 DIAGNOSIS — C538 Malignant neoplasm of overlapping sites of cervix uteri: Secondary | ICD-10-CM

## 2024-03-07 DIAGNOSIS — Z801 Family history of malignant neoplasm of trachea, bronchus and lung: Secondary | ICD-10-CM | POA: Insufficient documentation

## 2024-03-07 DIAGNOSIS — C786 Secondary malignant neoplasm of retroperitoneum and peritoneum: Secondary | ICD-10-CM | POA: Insufficient documentation

## 2024-03-07 DIAGNOSIS — C787 Secondary malignant neoplasm of liver and intrahepatic bile duct: Secondary | ICD-10-CM | POA: Insufficient documentation

## 2024-03-07 DIAGNOSIS — D649 Anemia, unspecified: Secondary | ICD-10-CM | POA: Insufficient documentation

## 2024-03-07 DIAGNOSIS — Z808 Family history of malignant neoplasm of other organs or systems: Secondary | ICD-10-CM | POA: Diagnosis not present

## 2024-03-07 DIAGNOSIS — J449 Chronic obstructive pulmonary disease, unspecified: Secondary | ICD-10-CM | POA: Diagnosis not present

## 2024-03-07 DIAGNOSIS — C539 Malignant neoplasm of cervix uteri, unspecified: Secondary | ICD-10-CM | POA: Insufficient documentation

## 2024-03-07 DIAGNOSIS — R634 Abnormal weight loss: Secondary | ICD-10-CM | POA: Insufficient documentation

## 2024-03-07 DIAGNOSIS — C7801 Secondary malignant neoplasm of right lung: Secondary | ICD-10-CM | POA: Insufficient documentation

## 2024-03-07 DIAGNOSIS — G629 Polyneuropathy, unspecified: Secondary | ICD-10-CM | POA: Insufficient documentation

## 2024-03-07 DIAGNOSIS — F17210 Nicotine dependence, cigarettes, uncomplicated: Secondary | ICD-10-CM | POA: Insufficient documentation

## 2024-03-07 DIAGNOSIS — C7802 Secondary malignant neoplasm of left lung: Secondary | ICD-10-CM | POA: Insufficient documentation

## 2024-03-07 DIAGNOSIS — Z7689 Persons encountering health services in other specified circumstances: Secondary | ICD-10-CM | POA: Diagnosis not present

## 2024-03-07 DIAGNOSIS — H189 Unspecified disorder of cornea: Secondary | ICD-10-CM | POA: Diagnosis not present

## 2024-03-07 DIAGNOSIS — Z79899 Other long term (current) drug therapy: Secondary | ICD-10-CM | POA: Insufficient documentation

## 2024-03-07 DIAGNOSIS — Z5111 Encounter for antineoplastic chemotherapy: Secondary | ICD-10-CM | POA: Diagnosis not present

## 2024-03-07 DIAGNOSIS — I10 Essential (primary) hypertension: Secondary | ICD-10-CM | POA: Diagnosis not present

## 2024-03-07 LAB — COMPREHENSIVE METABOLIC PANEL WITH GFR
ALT: 13 U/L (ref 0–44)
AST: 20 U/L (ref 15–41)
Albumin: 3.4 g/dL — ABNORMAL LOW (ref 3.5–5.0)
Alkaline Phosphatase: 46 U/L (ref 38–126)
Anion gap: 10 (ref 5–15)
BUN: 21 mg/dL — ABNORMAL HIGH (ref 6–20)
CO2: 23 mmol/L (ref 22–32)
Calcium: 9.1 mg/dL (ref 8.9–10.3)
Chloride: 106 mmol/L (ref 98–111)
Creatinine, Ser: 0.97 mg/dL (ref 0.44–1.00)
GFR, Estimated: >60 mL/min (ref >60)
Glucose, Bld: 98 mg/dL (ref 70–99)
Potassium: 4.1 mmol/L (ref 3.5–5.1)
Sodium: 139 mmol/L (ref 135–145)
Total Bilirubin: 0.4 mg/dL (ref 0.0–1.2)
Total Protein: 7 g/dL (ref 6.5–8.1)

## 2024-03-07 LAB — CBC WITH DIFFERENTIAL/PLATELET
Abs Immature Granulocytes: 0.05 K/uL (ref 0.00–0.07)
Basophils Absolute: 0 K/uL (ref 0.0–0.1)
Basophils Relative: 0 %
Eosinophils Absolute: 0.1 K/uL (ref 0.0–0.5)
Eosinophils Relative: 1 %
HCT: 31.7 % — ABNORMAL LOW (ref 36.0–46.0)
Hemoglobin: 10.5 g/dL — ABNORMAL LOW (ref 12.0–15.0)
Immature Granulocytes: 1 %
Lymphocytes Relative: 29 %
Lymphs Abs: 2.3 K/uL (ref 0.7–4.0)
MCH: 33.2 pg (ref 26.0–34.0)
MCHC: 33.1 g/dL (ref 30.0–36.0)
MCV: 100.3 fL — ABNORMAL HIGH (ref 80.0–100.0)
Monocytes Absolute: 1.2 K/uL — ABNORMAL HIGH (ref 0.1–1.0)
Monocytes Relative: 16 %
Neutro Abs: 4.2 K/uL (ref 1.7–7.7)
Neutrophils Relative %: 53 %
Platelets: 241 K/uL (ref 150–400)
RBC: 3.16 MIL/uL — ABNORMAL LOW (ref 3.87–5.11)
RDW: 19.6 % — ABNORMAL HIGH (ref 11.5–15.5)
WBC: 7.8 K/uL (ref 4.0–10.5)
nRBC: 0 % (ref 0.0–0.2)

## 2024-03-07 LAB — MAGNESIUM: Magnesium: 1.8 mg/dL (ref 1.7–2.4)

## 2024-03-07 MED ORDER — ONDANSETRON HCL 4 MG/2ML IJ SOLN
8.0000 mg | Freq: Once | INTRAMUSCULAR | Status: AC
  Administered 2024-03-07: 8 mg via INTRAVENOUS
  Filled 2024-03-07: qty 4

## 2024-03-07 MED ORDER — HEPARIN SOD (PORK) LOCK FLUSH 100 UNIT/ML IV SOLN: 500.0000 [IU] | Freq: Once | INTRAVENOUS | Status: DC | PRN

## 2024-03-07 MED ORDER — SODIUM CHLORIDE 0.9 % IV SOLN: INTRAVENOUS | Status: DC

## 2024-03-07 MED ORDER — PACLITAXEL PROTEIN-BOUND CHEMO INJECTION 100 MG
100.0000 mg/m2 | Freq: Once | INTRAVENOUS | Status: AC
  Administered 2024-03-07: 175 mg via INTRAVENOUS
  Filled 2024-03-07: qty 35

## 2024-03-07 NOTE — Patient Instructions (Signed)
 [facility] [place] - A DEPT OF [facility]  Discharge Instructions: Thank you for choosing [facility] to provide your oncology and hematology care.  If you have a lab appointment with the Cancer Center - please note that after April 8th, 2024, all labs will be drawn in the cancer center.  You do not have to check in or register with the main entrance as you have in the past but will complete your check-in in the cancer center.  Wear comfortable clothing and clothing appropriate for easy access to any Portacath or PICC line.   We strive to give you quality time with your provider. You may need to reschedule your appointment if you arrive late (15 or more minutes).  Arriving late affects you and other patients whose appointments are after yours.  Also, if you miss three or more appointments without notifying the office, you may be dismissed from the clinic at the provider's discretion.      For prescription refill requests, have your pharmacy contact our office and allow 72 hours for refills to be completed.    Today you received the following chemotherapy and/or immunotherapy agents abraxane     To help prevent nausea and vomiting after your treatment, we encourage you to take your nausea medication as directed.  BELOW ARE SYMPTOMS THAT SHOULD BE REPORTED IMMEDIATELY: *FEVER GREATER THAN 100.4 F (38 C) OR HIGHER *CHILLS OR SWEATING *NAUSEA AND VOMITING THAT IS NOT CONTROLLED WITH YOUR NAUSEA MEDICATION *UNUSUAL SHORTNESS OF BREATH *UNUSUAL BRUISING OR BLEEDING *URINARY PROBLEMS (pain or burning when urinating, or frequent urination) *BOWEL PROBLEMS (unusual diarrhea, constipation, pain near the anus) TENDERNESS IN MOUTH AND THROAT WITH OR WITHOUT PRESENCE OF ULCERS (sore throat, sores in mouth, or a toothache) UNUSUAL RASH, SWELLING OR PAIN  UNUSUAL VAGINAL DISCHARGE OR ITCHING   Items with * indicate a potential emergency and should be followed up as  soon as possible or go to the Emergency Department if any problems should occur.  Please show the CHEMOTHERAPY ALERT CARD or IMMUNOTHERAPY ALERT CARD at check-in to the Emergency Department and triage nurse.  Should you have questions after your visit or need to cancel or reschedule your appointment, please contact [facility] [place] - A DEPT OF [facility] [phone]  and follow the prompts.  Office hours are 8:00 a.m. to 4:30 p.m. Monday - Friday. Please note that voicemails left after 4:00 p.m. may not be returned until the following business day.  We are closed weekends and major holidays. You have access to a nurse at all times for urgent questions. Please call the main number to the clinic [phone] and follow the prompts.  For any non-urgent questions, you may also contact your provider using MyChart. We now offer e-Visits for anyone 73 and older to request care online for non-urgent symptoms. For details visit [email or web address].   Also download the MyChart app! Go to the app store, search MyChart, open the app, select , and log in with your MyChart username and password.

## 2024-03-07 NOTE — Progress Notes (Signed)
 Patient tolerated chemotherapy with no complaints voiced.  Side effects with management reviewed with understanding verbalized.  Port site clean and dry with no bruising or swelling noted at site.  Good blood return noted before and after administration of chemotherapy.  Band aid applied.  Patient left in satisfactory condition with VSS and no s/s of distress noted. All follow ups as scheduled.   Venkat Ankney [employer]

## 2024-03-07 NOTE — Patient Instructions (Signed)

## 2024-03-07 NOTE — Progress Notes (Signed)
 [facility] [address] [place] [address]    Clinic Day:  03/07/24     Referring physician: Melvenia Manus BRAVO, MD  Patient Care Team: [name], FNP as PCP - General (Family Medicine) [name], MD as Medical Oncologist (Medical Oncology) [name], RN as Oncology Nurse Navigator (Medical Oncology)   ASSESSMENT & PLAN:   Assessment: 1.  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 - CTAP (08/30/2022): Irregularity of the endometrium extending through the cervix with foci of gas in the cervix.  Multifocal omental and peritoneal metastasis.  Metastatic left periaortic, aortocaval, left external iliac and right common iliac lymph nodes.  Multiple bilateral lung nodules at bases.  12 mm hypodensity in the liver near the falciform ligament. - Endometrial biopsy (09/09/2022): Invasive moderately to poorly differentiated squamous cell carcinoma with abundant necrosis.  Cervix biopsy: Invasive and in situ moderate to poorly differentiated squamous cell carcinoma.  High risk HPV positive. - NGS: PD-L1 (22 C3): CPS 2, HER2 IHC negative.  PIK3CA exon 10 pathogenic variant.  MS-stable.  TMB-low.  FANCM and SDHA pathogenic variants. - PD-L1 CPS: 8% - Cycle 1 of cisplatin , paclitaxel , bevacizumab  on 10/02/2022, pembrolizumab  added with cycle 2 on 10/23/2022 through 09/17/2023 (cycle 12) discontinued due to progression.  Paclitaxel  was discontinued on 05/12/2023 from neuropathy. - CT CAP (10/18/2022): Increased size and number of bilateral lung nodules.  New subtle hepatic hypodensity suspicious for metastatic disease.  Enlarged left periportal lymph node. 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 - Abraxane  100 mg/m 3 weeks on/1 week off started on 02/08/2024.   2.  Social/family history: - She is married and lives at home with her husband and roommate.  She has never worked.  She is current active smoker, half pack per day, for the last 28 years.  She occasionally smokes marijuana. - Mother died of lung cancer.  Father had head and neck cancer.  Maternal grandmother had cancer.  Paternal uncle died of cancer.  Maternal uncle also had cancer.    Plan: 1.  Metastatic cervical cancer to the lungs, peritoneum and liver: - CTAP (01/23/2023): Increasing size of liver and lung lesions. - Abraxane  cycle 1 started on 02/08/2024. - She denies any GI side effects.  Denies any worsening neuropathy. - Reviewed labs today: Normal LFTs and creatinine.  CBC grossly normal. - Proceed with cycle 2 today.  Will continue Abraxane  at 100 mg/m.  May increase to 1.5 mg/m if no worsening of neuropathy in the future. - RTC 4 weeks for follow-up.    2.  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  Hemoglobin today is 10.5.   3.  Peripheral neuropathy: - Continue Lyrica  3 times daily and oxycodone  daily as needed.  Neuropathy stable.   4.  Hypertension: - Continue amlodipine  10 mg daily.  Blood pressure is 122/74.  5.  Hypomagnesemia: - Continue magnesium  3 times daily.  Magnesium  is normal at 1.8.  6.  Weight loss/decreased appetite: - We started her on Megace  as she lost 11 pounds.  She has been eating well and has gained about 10 pounds.  Albumin also improved 3.4.  Continue Megace  twice daily at this time.        No orders of the defined types were placed in this encounter.     Kristin Carson,acting as a Neurosurgeon for [name], MD.,have documented all relevant documentation on the behalf of [name], MD,as directed by  [name], MD  while in the presence of [name], MD.  I, [name] MD, have reviewed the above documentation for accuracy and completeness, and I agree with the above.     [name], MD   7/7/202512:35 PM  CHIEF COMPLAINT:   Diagnosis: metastatic cervical squamous cell carcinoma    Cancer Staging  Cervical cancer [facility]) Staging form: Cervix Uteri, AJCC Version 9 - Clinical stage from 09/19/2022: FIGO Stage IVB (cT4, cN2a, cM1) - Unsigned    Prior Therapy: 1.  Cisplatin , paclitaxel , bevacizumab  and Keytruda  2.  Tivdak  x 3 cycles  Current Therapy: Abraxane  3 weeks on/1 week off   HISTORY OF PRESENT ILLNESS:   Oncology History Overview Note  PD-L1 CPS 8%   Cervical cancer (HCC)  09/19/2022 Initial Diagnosis   Cervical cancer (HCC)   10/02/2022 - 09/17/2023 Chemotherapy   Patient is on Treatment Plan : CERVICAL Cisplatin  50 mg/m2 + PAClitaxel  135 mg/m2 + Bevacizumab  15 mg/kg + Keytruda  q21d     10/29/2023 Genetic Testing   Negative genetic testing on the CancerNext+RNAinsight.  The report date is 10/29/2023.  The [facility] CancerNext+RNAinsight Panel includes sequencing, rearrangement analysis, and RNA analysis for the following 39 genes: APC, ATM, BAP1, BARD1, BMPR1A, BRCA1, BRCA2, BRIP1, CDH1, CDKN2A, CHEK2, FH, FLCN, MET, MLH1, MSH2, MSH6, MUTYH, NF1, NTHL1, PALB2, PMS2, PTEN, RAD51C, RAD51D, SMAD4, STK11, TP53, TSC1, TSC2, and VHL (sequencing and deletion/duplication); AXIN2, HOXB13, MBD4, MSH3, POLD1 and POLE (sequencing only); EPCAM and GREM1 (deletion/duplication only).   11/12/2023 - 12/24/2023 Chemotherapy   Patient is on Treatment Plan : CERVICAL Tisotumab vedotin -tftv (Tivdak ) q21d     02/08/2024 -  Chemotherapy   Patient is on Treatment Plan : CERVIX PACLitaxel -Albumin (Abraxane ) (100) D1,8,15 q28d     Uterine cancer (HCC)  10/15/2023 Initial Diagnosis   Uterine cancer (HCC)   10/29/2023 Genetic Testing   Negative genetic testing on the  CancerNext+RNAinsight.  The report date is 10/29/2023.  The [facility] CancerNext+RNAinsight Panel includes sequencing, rearrangement analysis, and RNA analysis for the following 39 genes: APC, ATM, BAP1, BARD1, BMPR1A, BRCA1, BRCA2, BRIP1, CDH1, CDKN2A, CHEK2, FH, FLCN, MET, MLH1, MSH2, MSH6, MUTYH, NF1, NTHL1, PALB2, PMS2, PTEN, RAD51C, RAD51D, SMAD4, STK11, TP53, TSC1, TSC2, and VHL (sequencing and deletion/duplication); AXIN2, HOXB13, MBD4, MSH3, POLD1 and POLE (sequencing only); EPCAM and GREM1 (deletion/duplication only).      INTERVAL HISTORY:   Kristin Carson is a 53 y.o. female presenting to clinic today for follow up of metastatic cervical squamous cell carcinoma. She was last seen by me on 02/08/24.  Since her last visit, she underwent endometrial and cervical biopsies on 02/10/24. The endometrium biopsy showed: fragments of non-keratinizing invasive squamous cell carcinoma, fragments of high-grade squamous intraepithelial lesion, and background necrotic debris with stromal fragments and adipose tissue. The biopsy of the cervix revealed: invasive squamous cell carcinoma arising in a background of high-grade squamous intraepithelial lesion.   Today, she states that she is doing well overall. Her appetite level is at 80%. Her energy level is at 75%.   PAST MEDICAL HISTORY:   Past Medical History: Past Medical History:  Diagnosis Date   Anemia    Asthma    Bronchitis  Cancer (HCC)    Cervical   CHF (congestive heart failure) (HCC)    COPD (chronic obstructive pulmonary disease) (HCC)    Depression    GERD (gastroesophageal reflux disease)    Headache    Hypertension    Port-A-Cath in place 09/25/2022    Surgical History: Past Surgical History:  Procedure Laterality Date   BREAST SURGERY     CESAREAN SECTION     IR IMAGING GUIDED PORT INSERTION  09/30/2022   LEG SURGERY      Social History: Social History   Socioeconomic History   Marital status: Married    Spouse name: Not on  file   Number of children: Not on file   Years of education: Not on file   Highest education level: Not on file  Occupational History   Not on file  Tobacco Use   Smoking status: Every Day    Current packs/day: 0.25    Average packs/day: 0.3 packs/day for 23.0 years (5.8 ttl pk-yrs)    Types: Cigarettes   Smokeless tobacco: Never  Vaping Use   Vaping status: Never Used  Substance and Sexual Activity   Alcohol  use: Yes    Comment: occ   Drug use: Yes    Types: Marijuana   Sexual activity: Yes    Birth control/protection: None  Other Topics Concern   Not on file  Social History Narrative   Not on file   Social Drivers of Health   Financial Resource Strain: Low Risk  (09/09/2022)   Overall Financial Resource Strain (CARDIA)    Difficulty of Paying Living Expenses: Not hard at all  Food Insecurity: No Food Insecurity (01/24/2024)   Hunger Vital Sign    Worried About Running Out of Food in the Last Year: Never true    Ran Out of Food in the Last Year: Never true  Transportation Needs: No Transportation Needs (01/24/2024)   PRAPARE - Administrator, Civil Service (Medical): No    Lack of Transportation (Non-Medical): No  Physical Activity: Insufficiently Active (09/09/2022)   Exercise Vital Sign    Days of Exercise per Week: 2 days    Minutes of Exercise per Session: 20 min  Stress: No Stress Concern Present (09/09/2022)   [employer] of Occupational Health - Occupational Stress Questionnaire    Feeling of Stress : Only a little  Social Connections: Socially Isolated (01/24/2024)   Social Connection and Isolation Panel    Frequency of Communication with Friends and Family: More than three times a week    Frequency of Social Gatherings with Friends and Family: More than three times a week    Attends Religious Services: Never    Database administrator or Organizations: No    Attends Banker Meetings: Never    Marital Status: Separated  Intimate  Partner Violence: Not At Risk (01/24/2024)   Humiliation, Afraid, Rape, and Kick questionnaire    Fear of Current or Ex-Partner: No    Emotionally Abused: No    Physically Abused: No    Sexually Abused: No    Family History: Family History  Problem Relation Age of Onset   Dermatomyositis Mother    Hypertension Mother    Cancer Mother        lung   Cancer Father        lung   Heart disease Father    Lung cancer Paternal Uncle    Lung cancer Maternal Uncle    Colon cancer Neg  Hx    Breast cancer Neg Hx    Ovarian cancer Neg Hx    Endometrial cancer Neg Hx    Pancreatic cancer Neg Hx    Prostate cancer Neg Hx     Current Medications:  Current Outpatient Medications:    amLODipine  (NORVASC ) 10 MG tablet, Take 1 tablet (10 mg total) by mouth daily., Disp: 90 tablet, Rfl: 3   budesonide -formoterol  (SYMBICORT ) 160-4.5 MCG/ACT inhaler, Inhale 2 puffs into the lungs in the morning and at bedtime. (Patient taking differently: Inhale 2 puffs into the lungs daily as needed.), Disp: 1 each, Rfl: 3   levalbuterol  (XOPENEX  HFA) 45 MCG/ACT inhaler, Inhale 2 puffs into the lungs every 8 (eight) hours as needed for wheezing or shortness of breath., Disp: 1 each, Rfl: 2   lidocaine -prilocaine  (EMLA ) cream, Apply a quarter-sized amount to port a cath site and cover with plastic wrap 1 hour prior to infusion appointments, Disp: 30 g, Rfl: 3   magnesium  oxide (MAG-OX) 400 (240 Mg) MG tablet, Take 1 tablet (400 mg total) by mouth 3 (three) times daily., Disp: 90 tablet, Rfl: 3   megestrol  (MEGACE ) 400 MG/10ML suspension, Take 10 mLs (400 mg total) by mouth 2 (two) times daily., Disp: 480 mL, Rfl: 3   oxyCODONE  (OXY IR/ROXICODONE ) 5 MG immediate release tablet, Take 1 tablet (5 mg total) by mouth every 6 (six) hours as needed for severe pain (pain score 7-10)., Disp: 120 tablet, Rfl: 0   prednisoLONE  5 MG TABS tablet, Take 2 tablets (10 mg total) by mouth daily. Decrease by 2 tablets every 5 days, Disp:  174 tablet, Rfl: 0   prednisoLONE  acetate (PRED FORTE ) 1 % ophthalmic suspension, Place 1 drop into both eyes 2 (two) times daily., Disp: , Rfl:    pregabalin  (LYRICA ) 200 MG capsule, Take 1 capsule (200 mg total) by mouth 3 (three) times daily., Disp: 180 capsule, Rfl: 0   prochlorperazine  (COMPAZINE ) 10 MG tablet, Take 1 tablet (10 mg total) by mouth every 6 (six) hours as needed., Disp: 60 tablet, Rfl: 2 No current facility-administered medications for this visit.  Facility-Administered Medications Ordered in Other Visits:    0.9 %  sodium chloride  infusion, , Intravenous, Continuous, [name], MD, Last Rate: 10 mL/hr at 03/07/24 1110, New Bag at 03/07/24 1110   heparin  lock flush 100 unit/mL, 500 Units, Intracatheter, Once PRN, Nastassja Witkop, MD   Allergies: Allergies  Allergen Reactions   Carrot [Daucus Carota] Hives   Lisinopril -Hydrochlorothiazide      Oral swelling   Ibuprofen  Other (See Comments)    Oral swelling   Other Itching and Other (See Comments)    Hair dye, blisters, pus-filled, soreness   Tramadol  Hives   Erythromycin Hives    REVIEW OF SYSTEMS:   Review of Systems  Constitutional:  Negative for chills, fatigue and fever.  HENT:   Negative for lump/mass, mouth sores, nosebleeds, sore throat and trouble swallowing.   Eyes:  Negative for eye problems.  Respiratory:  Positive for cough and shortness of breath.   Cardiovascular:  Negative for chest pain, leg swelling and palpitations.  Gastrointestinal:  Negative for abdominal pain, constipation, diarrhea, nausea and vomiting.  Genitourinary:  Negative for bladder incontinence, difficulty urinating, dysuria, frequency, hematuria and nocturia.   Musculoskeletal:  Negative for arthralgias, back pain, flank pain, myalgias and neck pain.  Skin:  Negative for itching and rash.  Neurological:  Positive for numbness. Negative for dizziness and headaches.  Hematological:  Does not bruise/bleed easily.  Psychiatric/Behavioral:  Negative for depression, sleep disturbance and suicidal ideas. The patient is not nervous/anxious.   All other systems reviewed and are negative.    VITALS:   Last menstrual period 08/15/2022.  Wt Readings from Last 3 Encounters:  03/07/24 166 lb 7.2 oz (75.5 kg)  02/15/24 157 lb 6.5 oz (71.4 kg)  02/08/24 149 lb 11.1 oz (67.9 kg)    There is no height or weight on file to calculate BMI.  Performance status (ECOG): 1 - Symptomatic but completely ambulatory  PHYSICAL EXAM:   Physical Exam Vitals and nursing note reviewed. Exam conducted with a chaperone present.  Constitutional:      Appearance: Normal appearance.  Cardiovascular:     Rate and Rhythm: Normal rate and regular rhythm.     Pulses: Normal pulses.     Heart sounds: Normal heart sounds.  Pulmonary:     Effort: Pulmonary effort is normal.     Breath sounds: Normal breath sounds.  Abdominal:     Palpations: Abdomen is soft. There is no hepatomegaly, splenomegaly or mass.     Tenderness: There is no abdominal tenderness.  Musculoskeletal:     Right lower leg: No edema.     Left lower leg: No edema.  Lymphadenopathy:     Cervical: No cervical adenopathy.     Right cervical: No superficial, deep or posterior cervical adenopathy.    Left cervical: No superficial, deep or posterior cervical adenopathy.     Upper Body:     Right upper body: No supraclavicular or axillary adenopathy.     Left upper body: No supraclavicular or axillary adenopathy.  Neurological:     General: No focal deficit present.     Mental Status: She is alert and oriented to person, place, and time.  Psychiatric:        Mood and Affect: Mood normal.        Behavior: Behavior normal.     LABS:      Latest Ref Rng & Units 03/07/2024    9:54 AM 02/24/2024   12:37 PM 02/15/2024   11:35 AM  CBC  WBC 4.0 - 10.5 K/uL 7.8  7.5  9.9   Hemoglobin 12.0 - 15.0 g/dL 89.4  9.6  9.7   Hematocrit 36.0 - 46.0 % 31.7  28.5  28.8    Platelets 150 - 400 K/uL 241  272  213       Latest Ref Rng & Units 03/07/2024    9:54 AM 02/24/2024   12:37 PM 02/15/2024   11:35 AM  CMP  Glucose 70 - 99 mg/dL 98  897  895   BUN 6 - 20 mg/dL 21  25  16    Creatinine 0.44 - 1.00 mg/dL 9.02  9.09  8.99   Sodium 135 - 145 mmol/L 139  138  137   Potassium 3.5 - 5.1 mmol/L 4.1  4.2  3.9   Chloride 98 - 111 mmol/L 106  108  107   CO2 22 - 32 mmol/L 23  21  22    Calcium  8.9 - 10.3 mg/dL 9.1  8.8  8.8   Total Protein 6.5 - 8.1 g/dL 7.0  6.6  6.7   Total Bilirubin 0.0 - 1.2 mg/dL 0.4  0.3  0.3   Alkaline Phos 38 - 126 U/L 46  39  53   AST 15 - 41 U/L 20  16  17    ALT 0 - 44 U/L 13  10  10  No results found for: CEA1, CEA / No results found for: CEA1, CEA No results found for: PSA1 No results found for: CAN199 No results found for: CAN125  No results found for: STEPHANY RINGS, A1GS, A2GS, BETS, BETA2SER, GAMS, MSPIKE, SPEI Lab Results  Component Value Date   TIBC 318 11/12/2023   TIBC 412 09/22/2022   FERRITIN 400 (H) 11/12/2023   FERRITIN 35 09/22/2022   IRONPCTSAT 20 11/12/2023   IRONPCTSAT 7 (L) 09/22/2022   No results found for: LDH   STUDIES:   DG Chest Port 1 View Result Date: 02/06/2024 CLINICAL DATA:  Several day history of weakness and intermittent headaches associated with nausea EXAM: PORTABLE CHEST 1 VIEW COMPARISON:  Chest radiograph dated 10/25/2023 FINDINGS: Lines/tubes: Right chest wall port tip projects over the superior cavoatrial junction. Lungs: Well inflated lungs. Increased size and conspicuity of bilateral lower lung nodules. Pleura: No pneumothorax or pleural effusion. Heart/mediastinum: The heart size and mediastinal contours are within normal limits. Bones: No acute osseous abnormality. IMPRESSION: Increased size and conspicuity of bilateral lower lung nodules, suspicious for metastatic disease. Electronically Signed   By: Limin  Xu M.D.   On: 02/06/2024 16:37    CT Head Wo Contrast Result Date: 02/06/2024 CLINICAL DATA:  New onset headache for couple of days EXAM: CT HEAD WITHOUT CONTRAST TECHNIQUE: Contiguous axial images were obtained from the base of the skull through the vertex without intravenous contrast. RADIATION DOSE REDUCTION: This exam was performed according to the departmental dose-optimization program which includes automated exposure control, adjustment of the mA and/or kV according to patient size and/or use of iterative reconstruction technique. COMPARISON:  07/02/2023 FINDINGS: Brain: No evidence of acute infarction, hemorrhage, hydrocephalus, extra-axial collection or mass lesion/mass effect. Vascular: No hyperdense vessel or unexpected calcification. Skull: Normal. Negative for fracture or focal lesion. Sinuses/Orbits: No acute finding. Other: None. IMPRESSION: No acute intracranial pathology. No noncontrast CT findings to explain headache. Electronically Signed   By: Marolyn JONETTA Jaksch M.D.   On: 02/06/2024 16:29

## 2024-03-07 NOTE — Progress Notes (Signed)
 Patient has been examined by Dr. Ellin Saba. Vital signs and labs have been reviewed by MD - ANC, Creatinine, LFTs, hemoglobin, and platelets are within treatment parameters per M.D. - pt may proceed with treatment.  Primary RN and pharmacy notified.

## 2024-03-08 DIAGNOSIS — J449 Chronic obstructive pulmonary disease, unspecified: Secondary | ICD-10-CM | POA: Diagnosis not present

## 2024-03-09 DIAGNOSIS — J449 Chronic obstructive pulmonary disease, unspecified: Secondary | ICD-10-CM | POA: Diagnosis not present

## 2024-03-10 DIAGNOSIS — J449 Chronic obstructive pulmonary disease, unspecified: Secondary | ICD-10-CM | POA: Diagnosis not present

## 2024-03-11 DIAGNOSIS — J449 Chronic obstructive pulmonary disease, unspecified: Secondary | ICD-10-CM | POA: Diagnosis not present

## 2024-03-12 DIAGNOSIS — J449 Chronic obstructive pulmonary disease, unspecified: Secondary | ICD-10-CM | POA: Diagnosis not present

## 2024-03-12 DIAGNOSIS — Z419 Encounter for procedure for purposes other than remedying health state, unspecified: Secondary | ICD-10-CM | POA: Diagnosis not present

## 2024-03-14 ENCOUNTER — Inpatient Hospital Stay

## 2024-03-14 DIAGNOSIS — J449 Chronic obstructive pulmonary disease, unspecified: Secondary | ICD-10-CM | POA: Diagnosis not present

## 2024-03-15 ENCOUNTER — Other Ambulatory Visit: Payer: Self-pay

## 2024-03-15 DIAGNOSIS — J449 Chronic obstructive pulmonary disease, unspecified: Secondary | ICD-10-CM | POA: Diagnosis not present

## 2024-03-16 DIAGNOSIS — J449 Chronic obstructive pulmonary disease, unspecified: Secondary | ICD-10-CM | POA: Diagnosis not present

## 2024-03-17 ENCOUNTER — Ambulatory Visit

## 2024-03-17 ENCOUNTER — Inpatient Hospital Stay

## 2024-03-17 VITALS — BP 132/76 | HR 90 | Temp 97.0°F | Resp 19

## 2024-03-17 DIAGNOSIS — C7802 Secondary malignant neoplasm of left lung: Secondary | ICD-10-CM | POA: Diagnosis not present

## 2024-03-17 DIAGNOSIS — C538 Malignant neoplasm of overlapping sites of cervix uteri: Secondary | ICD-10-CM

## 2024-03-17 DIAGNOSIS — D649 Anemia, unspecified: Secondary | ICD-10-CM | POA: Diagnosis not present

## 2024-03-17 DIAGNOSIS — C786 Secondary malignant neoplasm of retroperitoneum and peritoneum: Secondary | ICD-10-CM | POA: Diagnosis not present

## 2024-03-17 DIAGNOSIS — C7801 Secondary malignant neoplasm of right lung: Secondary | ICD-10-CM | POA: Diagnosis not present

## 2024-03-17 DIAGNOSIS — Z95828 Presence of other vascular implants and grafts: Secondary | ICD-10-CM

## 2024-03-17 DIAGNOSIS — G629 Polyneuropathy, unspecified: Secondary | ICD-10-CM | POA: Diagnosis not present

## 2024-03-17 DIAGNOSIS — R634 Abnormal weight loss: Secondary | ICD-10-CM | POA: Diagnosis not present

## 2024-03-17 DIAGNOSIS — Z5111 Encounter for antineoplastic chemotherapy: Secondary | ICD-10-CM | POA: Diagnosis not present

## 2024-03-17 DIAGNOSIS — J449 Chronic obstructive pulmonary disease, unspecified: Secondary | ICD-10-CM | POA: Diagnosis not present

## 2024-03-17 DIAGNOSIS — F17210 Nicotine dependence, cigarettes, uncomplicated: Secondary | ICD-10-CM | POA: Diagnosis not present

## 2024-03-17 DIAGNOSIS — H189 Unspecified disorder of cornea: Secondary | ICD-10-CM | POA: Diagnosis not present

## 2024-03-17 DIAGNOSIS — I10 Essential (primary) hypertension: Secondary | ICD-10-CM | POA: Diagnosis not present

## 2024-03-17 DIAGNOSIS — C539 Malignant neoplasm of cervix uteri, unspecified: Secondary | ICD-10-CM | POA: Diagnosis not present

## 2024-03-17 DIAGNOSIS — C787 Secondary malignant neoplasm of liver and intrahepatic bile duct: Secondary | ICD-10-CM | POA: Diagnosis not present

## 2024-03-17 LAB — COMPREHENSIVE METABOLIC PANEL WITH GFR
ALT: 12 U/L (ref 0–44)
AST: 21 U/L (ref 15–41)
Albumin: 3.3 g/dL — ABNORMAL LOW (ref 3.5–5.0)
Alkaline Phosphatase: 54 U/L (ref 38–126)
Anion gap: 11 (ref 5–15)
BUN: 14 mg/dL (ref 6–20)
CO2: 21 mmol/L — ABNORMAL LOW (ref 22–32)
Calcium: 9.2 mg/dL (ref 8.9–10.3)
Chloride: 103 mmol/L (ref 98–111)
Creatinine, Ser: 1.16 mg/dL — ABNORMAL HIGH (ref 0.44–1.00)
GFR, Estimated: 57 mL/min — ABNORMAL LOW (ref >60)
Glucose, Bld: 96 mg/dL (ref 70–99)
Potassium: 4 mmol/L (ref 3.5–5.1)
Sodium: 135 mmol/L (ref 135–145)
Total Bilirubin: 0.5 mg/dL (ref 0.0–1.2)
Total Protein: 7.3 g/dL (ref 6.5–8.1)

## 2024-03-17 LAB — CBC WITH DIFFERENTIAL/PLATELET
Abs Immature Granulocytes: 0.04 K/uL (ref 0.00–0.07)
Basophils Absolute: 0 K/uL (ref 0.0–0.1)
Basophils Relative: 0 %
Eosinophils Absolute: 0 K/uL (ref 0.0–0.5)
Eosinophils Relative: 1 %
HCT: 31.6 % — ABNORMAL LOW (ref 36.0–46.0)
Hemoglobin: 10.3 g/dL — ABNORMAL LOW (ref 12.0–15.0)
Immature Granulocytes: 1 %
Lymphocytes Relative: 27 %
Lymphs Abs: 1.7 K/uL (ref 0.7–4.0)
MCH: 32.5 pg (ref 26.0–34.0)
MCHC: 32.6 g/dL (ref 30.0–36.0)
MCV: 99.7 fL (ref 80.0–100.0)
Monocytes Absolute: 0.7 K/uL (ref 0.1–1.0)
Monocytes Relative: 11 %
Neutro Abs: 3.9 K/uL (ref 1.7–7.7)
Neutrophils Relative %: 60 %
Platelets: 305 K/uL (ref 150–400)
RBC: 3.17 MIL/uL — ABNORMAL LOW (ref 3.87–5.11)
RDW: 18.5 % — ABNORMAL HIGH (ref 11.5–15.5)
WBC: 6.3 K/uL (ref 4.0–10.5)
nRBC: 0.3 % — ABNORMAL HIGH (ref 0.0–0.2)

## 2024-03-17 LAB — MAGNESIUM: Magnesium: 1.5 mg/dL — ABNORMAL LOW (ref 1.7–2.4)

## 2024-03-17 MED ORDER — PACLITAXEL PROTEIN-BOUND CHEMO INJECTION 100 MG
100.0000 mg/m2 | Freq: Once | INTRAVENOUS | Status: AC
  Administered 2024-03-17: 175 mg via INTRAVENOUS
  Filled 2024-03-17: qty 35

## 2024-03-17 MED ORDER — SODIUM CHLORIDE 0.9% FLUSH
10.0000 mL | INTRAVENOUS | Status: DC | PRN
Start: 2024-03-17 — End: 2024-03-17
  Administered 2024-03-17: 10 mL

## 2024-03-17 MED ORDER — HEPARIN SOD (PORK) LOCK FLUSH 100 UNIT/ML IV SOLN
500.0000 [IU] | Freq: Once | INTRAVENOUS | Status: AC | PRN
  Administered 2024-03-17: 500 [IU]

## 2024-03-17 MED ORDER — MAGNESIUM SULFATE 2 GM/50ML IV SOLN
2.0000 g | Freq: Once | INTRAVENOUS | Status: AC
  Administered 2024-03-17: 2 g via INTRAVENOUS
  Filled 2024-03-17: qty 50

## 2024-03-17 MED ORDER — OXYCODONE HCL 5 MG PO TABS
5.0000 mg | ORAL_TABLET | Freq: Once | ORAL | Status: AC
  Administered 2024-03-17: 5 mg via ORAL
  Filled 2024-03-17: qty 1

## 2024-03-17 MED ORDER — SODIUM CHLORIDE 0.9% FLUSH
10.0000 mL | INTRAVENOUS | Status: DC | PRN
  Administered 2024-03-17: 10 mL via INTRAVENOUS

## 2024-03-17 MED ORDER — SODIUM CHLORIDE 0.9 % IV SOLN
INTRAVENOUS | Status: DC
Start: 2024-03-17 — End: 2024-03-17

## 2024-03-17 MED ORDER — ONDANSETRON HCL 4 MG/2ML IJ SOLN
8.0000 mg | Freq: Once | INTRAMUSCULAR | Status: AC
  Administered 2024-03-17: 8 mg via INTRAVENOUS
  Filled 2024-03-17: qty 4

## 2024-03-17 NOTE — Progress Notes (Signed)
 Patients port flushed without difficulty.  Good blood return noted with no bruising or swelling noted at site.  Patient remains accessed for treatment.

## 2024-03-17 NOTE — Patient Instructions (Signed)
 [facility] [place] - A DEPT OF [place]. [facility]  Discharge Instructions: Thank you for choosing [facility] to provide your oncology and hematology care.  If you have a lab appointment with the Cancer Center - please note that after April 8th, 2024, all labs will be drawn in the cancer center.  You do not have to check in or register with the main entrance as you have in the past but will complete your check-in in the cancer center.  Wear comfortable clothing and clothing appropriate for easy access to any Portacath or PICC line.   We strive to give you quality time with your provider. You may need to reschedule your appointment if you arrive late (15 or more minutes).  Arriving late affects you and other patients whose appointments are after yours.  Also, if you miss three or more appointments without notifying the office, you may be dismissed from the clinic at the provider's discretion.      For prescription refill requests, have your pharmacy contact our office and allow 72 hours for refills to be completed.    Today you received the following chemotherapy and/or immunotherapy agents Abraxane       To help prevent nausea and vomiting after your treatment, we encourage you to take your nausea medication as directed.  BELOW ARE SYMPTOMS THAT SHOULD BE REPORTED IMMEDIATELY: *FEVER GREATER THAN 100.4 F (38 C) OR HIGHER *CHILLS OR SWEATING *NAUSEA AND VOMITING THAT IS NOT CONTROLLED WITH YOUR NAUSEA MEDICATION *UNUSUAL SHORTNESS OF BREATH *UNUSUAL BRUISING OR BLEEDING *URINARY PROBLEMS (pain or burning when urinating, or frequent urination) *BOWEL PROBLEMS (unusual diarrhea, constipation, pain near the anus) TENDERNESS IN MOUTH AND THROAT WITH OR WITHOUT PRESENCE OF ULCERS (sore throat, sores in mouth, or a toothache) UNUSUAL RASH, SWELLING OR PAIN  UNUSUAL VAGINAL DISCHARGE OR ITCHING   Items with * indicate a potential emergency and should be followed up  as soon as possible or go to the Emergency Department if any problems should occur.  Please show the CHEMOTHERAPY ALERT CARD or IMMUNOTHERAPY ALERT CARD at check-in to the Emergency Department and triage nurse.  Should you have questions after your visit or need to cancel or reschedule your appointment, please contact [facility] [place] - A DEPT OF [facility] [phone]  and follow the prompts.  Office hours are 8:00 a.m. to 4:30 p.m. Monday - Friday. Please note that voicemails left after 4:00 p.m. may not be returned until the following business day.  We are closed weekends and major holidays. You have access to a nurse at all times for urgent questions. Please call the main number to the clinic [phone] and follow the prompts.  For any non-urgent questions, you may also contact your provider using MyChart. We now offer e-Visits for anyone 15 and older to request care online for non-urgent symptoms. For details visit [email or web address].   Also download the MyChart app! Go to the app store, search MyChart, open the app, select [place], and log in with your MyChart username and password.

## 2024-03-17 NOTE — Progress Notes (Signed)
Treatment given per orders. Patient tolerated it well without problems. Vitals stable and discharged home from clinic ambulatory. Follow up as scheduled.  

## 2024-03-18 DIAGNOSIS — J449 Chronic obstructive pulmonary disease, unspecified: Secondary | ICD-10-CM | POA: Diagnosis not present

## 2024-03-19 DIAGNOSIS — J449 Chronic obstructive pulmonary disease, unspecified: Secondary | ICD-10-CM | POA: Diagnosis not present

## 2024-03-21 ENCOUNTER — Inpatient Hospital Stay

## 2024-03-21 ENCOUNTER — Other Ambulatory Visit: Payer: Self-pay

## 2024-03-21 DIAGNOSIS — J449 Chronic obstructive pulmonary disease, unspecified: Secondary | ICD-10-CM | POA: Diagnosis not present

## 2024-03-22 DIAGNOSIS — J449 Chronic obstructive pulmonary disease, unspecified: Secondary | ICD-10-CM | POA: Diagnosis not present

## 2024-03-23 DIAGNOSIS — J449 Chronic obstructive pulmonary disease, unspecified: Secondary | ICD-10-CM | POA: Diagnosis not present

## 2024-03-24 ENCOUNTER — Other Ambulatory Visit: Payer: Self-pay | Admitting: *Deleted

## 2024-03-24 ENCOUNTER — Inpatient Hospital Stay

## 2024-03-24 ENCOUNTER — Other Ambulatory Visit (HOSPITAL_COMMUNITY): Payer: Self-pay | Admitting: *Deleted

## 2024-03-24 VITALS — BP 111/87 | HR 96 | Temp 98.4°F | Resp 18 | Wt 169.8 lb

## 2024-03-24 DIAGNOSIS — G629 Polyneuropathy, unspecified: Secondary | ICD-10-CM | POA: Diagnosis not present

## 2024-03-24 DIAGNOSIS — I10 Essential (primary) hypertension: Secondary | ICD-10-CM | POA: Diagnosis not present

## 2024-03-24 DIAGNOSIS — H189 Unspecified disorder of cornea: Secondary | ICD-10-CM | POA: Diagnosis not present

## 2024-03-24 DIAGNOSIS — C538 Malignant neoplasm of overlapping sites of cervix uteri: Secondary | ICD-10-CM

## 2024-03-24 DIAGNOSIS — Z95828 Presence of other vascular implants and grafts: Secondary | ICD-10-CM

## 2024-03-24 DIAGNOSIS — C786 Secondary malignant neoplasm of retroperitoneum and peritoneum: Secondary | ICD-10-CM | POA: Diagnosis not present

## 2024-03-24 DIAGNOSIS — G622 Polyneuropathy due to other toxic agents: Secondary | ICD-10-CM

## 2024-03-24 DIAGNOSIS — J449 Chronic obstructive pulmonary disease, unspecified: Secondary | ICD-10-CM | POA: Diagnosis not present

## 2024-03-24 DIAGNOSIS — C539 Malignant neoplasm of cervix uteri, unspecified: Secondary | ICD-10-CM | POA: Diagnosis not present

## 2024-03-24 DIAGNOSIS — Z7689 Persons encountering health services in other specified circumstances: Secondary | ICD-10-CM | POA: Diagnosis not present

## 2024-03-24 DIAGNOSIS — F17210 Nicotine dependence, cigarettes, uncomplicated: Secondary | ICD-10-CM | POA: Diagnosis not present

## 2024-03-24 DIAGNOSIS — R634 Abnormal weight loss: Secondary | ICD-10-CM | POA: Diagnosis not present

## 2024-03-24 DIAGNOSIS — D649 Anemia, unspecified: Secondary | ICD-10-CM | POA: Diagnosis not present

## 2024-03-24 DIAGNOSIS — Z5111 Encounter for antineoplastic chemotherapy: Secondary | ICD-10-CM | POA: Diagnosis not present

## 2024-03-24 DIAGNOSIS — C7802 Secondary malignant neoplasm of left lung: Secondary | ICD-10-CM | POA: Diagnosis not present

## 2024-03-24 DIAGNOSIS — C787 Secondary malignant neoplasm of liver and intrahepatic bile duct: Secondary | ICD-10-CM | POA: Diagnosis not present

## 2024-03-24 DIAGNOSIS — C7801 Secondary malignant neoplasm of right lung: Secondary | ICD-10-CM | POA: Diagnosis not present

## 2024-03-24 LAB — CBC WITH DIFFERENTIAL/PLATELET
Abs Immature Granulocytes: 0.05 K/uL (ref 0.00–0.07)
Basophils Absolute: 0 K/uL (ref 0.0–0.1)
Basophils Relative: 1 %
Eosinophils Absolute: 0.1 K/uL (ref 0.0–0.5)
Eosinophils Relative: 1 %
HCT: 30.6 % — ABNORMAL LOW (ref 36.0–46.0)
Hemoglobin: 10 g/dL — ABNORMAL LOW (ref 12.0–15.0)
Immature Granulocytes: 1 %
Lymphocytes Relative: 37 %
Lymphs Abs: 2.4 K/uL (ref 0.7–4.0)
MCH: 32.5 pg (ref 26.0–34.0)
MCHC: 32.7 g/dL (ref 30.0–36.0)
MCV: 99.4 fL (ref 80.0–100.0)
Monocytes Absolute: 0.6 K/uL (ref 0.1–1.0)
Monocytes Relative: 9 %
Neutro Abs: 3.3 K/uL (ref 1.7–7.7)
Neutrophils Relative %: 51 %
Platelets: 296 K/uL (ref 150–400)
RBC: 3.08 MIL/uL — ABNORMAL LOW (ref 3.87–5.11)
RDW: 17.9 % — ABNORMAL HIGH (ref 11.5–15.5)
WBC: 6.4 K/uL (ref 4.0–10.5)
nRBC: 0.3 % — ABNORMAL HIGH (ref 0.0–0.2)

## 2024-03-24 LAB — COMPREHENSIVE METABOLIC PANEL WITH GFR
ALT: 10 U/L (ref 0–44)
AST: 24 U/L (ref 15–41)
Albumin: 3.2 g/dL — ABNORMAL LOW (ref 3.5–5.0)
Alkaline Phosphatase: 51 U/L (ref 38–126)
Anion gap: 9 (ref 5–15)
BUN: 16 mg/dL (ref 6–20)
CO2: 24 mmol/L (ref 22–32)
Calcium: 9 mg/dL (ref 8.9–10.3)
Chloride: 104 mmol/L (ref 98–111)
Creatinine, Ser: 1.11 mg/dL — ABNORMAL HIGH (ref 0.44–1.00)
GFR, Estimated: >60 mL/min (ref >60)
Glucose, Bld: 100 mg/dL — ABNORMAL HIGH (ref 70–99)
Potassium: 3.7 mmol/L (ref 3.5–5.1)
Sodium: 137 mmol/L (ref 135–145)
Total Bilirubin: 0.3 mg/dL (ref 0.0–1.2)
Total Protein: 7 g/dL (ref 6.5–8.1)

## 2024-03-24 LAB — MAGNESIUM: Magnesium: 1.5 mg/dL — ABNORMAL LOW (ref 1.7–2.4)

## 2024-03-24 MED ORDER — OXYCODONE HCL 5 MG PO TABS
5.0000 mg | ORAL_TABLET | Freq: Once | ORAL | Status: AC
  Administered 2024-03-24: 5 mg via ORAL
  Filled 2024-03-24: qty 1

## 2024-03-24 MED ORDER — PREGABALIN 200 MG PO CAPS: 200.0000 mg | ORAL_CAPSULE | Freq: Three times a day (TID) | ORAL | 0 refills | Status: DC

## 2024-03-24 MED ORDER — SODIUM CHLORIDE 0.9 % IV SOLN: INTRAVENOUS | Status: DC

## 2024-03-24 MED ORDER — HEPARIN SOD (PORK) LOCK FLUSH 100 UNIT/ML IV SOLN
500.0000 [IU] | Freq: Once | INTRAVENOUS | Status: AC | PRN
Start: 2024-03-24 — End: 2024-03-24
  Administered 2024-03-24: 500 [IU]

## 2024-03-24 MED ORDER — MAGNESIUM SULFATE 2 GM/50ML IV SOLN
2.0000 g | Freq: Once | INTRAVENOUS | Status: AC
  Administered 2024-03-24: 2 g via INTRAVENOUS
  Filled 2024-03-24: qty 50

## 2024-03-24 MED ORDER — ONDANSETRON HCL 4 MG/2ML IJ SOLN
8.0000 mg | Freq: Once | INTRAMUSCULAR | Status: AC
  Administered 2024-03-24: 8 mg via INTRAVENOUS
  Filled 2024-03-24: qty 4

## 2024-03-24 MED ORDER — SODIUM CHLORIDE 0.9% FLUSH
10.0000 mL | INTRAVENOUS | Status: DC | PRN
Start: 2024-03-24 — End: 2024-03-24
  Administered 2024-03-24: 10 mL

## 2024-03-24 MED ORDER — OXYCODONE HCL 5 MG PO TABS: 5.0000 mg | ORAL_TABLET | Freq: Four times a day (QID) | ORAL | 0 refills | Status: DC | PRN

## 2024-03-24 MED ORDER — PACLITAXEL PROTEIN-BOUND CHEMO INJECTION 100 MG
100.0000 mg/m2 | Freq: Once | INTRAVENOUS | Status: AC
  Administered 2024-03-24: 175 mg via INTRAVENOUS
  Filled 2024-03-24: qty 35

## 2024-03-24 MED ORDER — MAGNESIUM OXIDE -MG SUPPLEMENT 400 (240 MG) MG PO TABS
400.0000 mg | ORAL_TABLET | Freq: Three times a day (TID) | ORAL | 3 refills | Status: DC
Start: 2024-03-24 — End: 2024-07-16

## 2024-03-24 NOTE — Progress Notes (Signed)
 Patient presents today for Abraxane  infusion.  Patient is in satisfactory condition with no new complaints voiced.  Vital signs are stable.  Labs reviewed and all labs are within treatment parameters. Patient will receive 2g IV magnesium  sulfate per Dr.Katragadda's standing orders.  We will proceed with treatment per MD orders.    Treatment given today per MD orders. Tolerated infusion without adverse affects. Vital signs stable. No complaints at this time. Discharged from clinic ambulatory in stable condition. Alert and oriented x 3. F/U with [facility] as scheduled.

## 2024-03-24 NOTE — Patient Instructions (Signed)
 [facility] [place] - A DEPT OF [facility]  Discharge Instructions: Thank you for choosing [facility] to provide your oncology and hematology care.  If you have a lab appointment with the Cancer Center - please note that after April 8th, 2024, all labs will be drawn in the cancer center.  You do not have to check in or register with the main entrance as you have in the past but will complete your check-in in the cancer center.  Wear comfortable clothing and clothing appropriate for easy access to any Portacath or PICC line.   We strive to give you quality time with your provider. You may need to reschedule your appointment if you arrive late (15 or more minutes).  Arriving late affects you and other patients whose appointments are after yours.  Also, if you miss three or more appointments without notifying the office, you may be dismissed from the clinic at the provider's discretion.      For prescription refill requests, have your pharmacy contact our office and allow 72 hours for refills to be completed.    Today you received the following chemotherapy and/or immunotherapy agents Abraxane  and 2 g IV magnesium  sulfate      To help prevent nausea and vomiting after your treatment, we encourage you to take your nausea medication as directed.  BELOW ARE SYMPTOMS THAT SHOULD BE REPORTED IMMEDIATELY: *FEVER GREATER THAN 100.4 F (38 C) OR HIGHER *CHILLS OR SWEATING *NAUSEA AND VOMITING THAT IS NOT CONTROLLED WITH YOUR NAUSEA MEDICATION *UNUSUAL SHORTNESS OF BREATH *UNUSUAL BRUISING OR BLEEDING *URINARY PROBLEMS (pain or burning when urinating, or frequent urination) *BOWEL PROBLEMS (unusual diarrhea, constipation, pain near the anus) TENDERNESS IN MOUTH AND THROAT WITH OR WITHOUT PRESENCE OF ULCERS (sore throat, sores in mouth, or a toothache) UNUSUAL RASH, SWELLING OR PAIN  UNUSUAL VAGINAL DISCHARGE OR ITCHING   Items with * indicate a potential  emergency and should be followed up as soon as possible or go to the Emergency Department if any problems should occur.  Please show the CHEMOTHERAPY ALERT CARD or IMMUNOTHERAPY ALERT CARD at check-in to the Emergency Department and triage nurse.  Should you have questions after your visit or need to cancel or reschedule your appointment, please contact [facility] [place] - A DEPT OF [facility] [phone]  and follow the prompts.  Office hours are 8:00 a.m. to 4:30 p.m. Monday - Friday. Please note that voicemails left after 4:00 p.m. may not be returned until the following business day.  We are closed weekends and major holidays. You have access to a nurse at all times for urgent questions. Please call the main number to the clinic [phone] and follow the prompts.  For any non-urgent questions, you may also contact your provider using MyChart. We now offer e-Visits for anyone 70 and older to request care online for non-urgent symptoms. For details visit [email or web address].   Also download the MyChart app! Go to the app store, search MyChart, open the app, select [place], and log in with your MyChart username and password.

## 2024-03-25 DIAGNOSIS — J449 Chronic obstructive pulmonary disease, unspecified: Secondary | ICD-10-CM | POA: Diagnosis not present

## 2024-03-26 DIAGNOSIS — J449 Chronic obstructive pulmonary disease, unspecified: Secondary | ICD-10-CM | POA: Diagnosis not present

## 2024-03-28 DIAGNOSIS — J449 Chronic obstructive pulmonary disease, unspecified: Secondary | ICD-10-CM | POA: Diagnosis not present

## 2024-03-29 DIAGNOSIS — J449 Chronic obstructive pulmonary disease, unspecified: Secondary | ICD-10-CM | POA: Diagnosis not present

## 2024-03-30 DIAGNOSIS — J449 Chronic obstructive pulmonary disease, unspecified: Secondary | ICD-10-CM | POA: Diagnosis not present

## 2024-03-31 ENCOUNTER — Other Ambulatory Visit: Payer: Self-pay

## 2024-03-31 DIAGNOSIS — J449 Chronic obstructive pulmonary disease, unspecified: Secondary | ICD-10-CM | POA: Diagnosis not present

## 2024-04-01 DIAGNOSIS — J449 Chronic obstructive pulmonary disease, unspecified: Secondary | ICD-10-CM | POA: Diagnosis not present

## 2024-04-02 DIAGNOSIS — J449 Chronic obstructive pulmonary disease, unspecified: Secondary | ICD-10-CM | POA: Diagnosis not present

## 2024-04-04 ENCOUNTER — Inpatient Hospital Stay: Admitting: Hematology

## 2024-04-04 ENCOUNTER — Inpatient Hospital Stay

## 2024-04-04 DIAGNOSIS — J449 Chronic obstructive pulmonary disease, unspecified: Secondary | ICD-10-CM | POA: Diagnosis not present

## 2024-04-05 DIAGNOSIS — J449 Chronic obstructive pulmonary disease, unspecified: Secondary | ICD-10-CM | POA: Diagnosis not present

## 2024-04-06 DIAGNOSIS — J449 Chronic obstructive pulmonary disease, unspecified: Secondary | ICD-10-CM | POA: Diagnosis not present

## 2024-04-07 ENCOUNTER — Inpatient Hospital Stay (HOSPITAL_BASED_OUTPATIENT_CLINIC_OR_DEPARTMENT_OTHER): Admitting: Hematology

## 2024-04-07 ENCOUNTER — Inpatient Hospital Stay

## 2024-04-07 ENCOUNTER — Inpatient Hospital Stay: Attending: Hematology

## 2024-04-07 VITALS — BP 171/90 | HR 88 | Resp 18

## 2024-04-07 VITALS — BP 151/85

## 2024-04-07 DIAGNOSIS — Z79899 Other long term (current) drug therapy: Secondary | ICD-10-CM | POA: Diagnosis not present

## 2024-04-07 DIAGNOSIS — C787 Secondary malignant neoplasm of liver and intrahepatic bile duct: Secondary | ICD-10-CM | POA: Insufficient documentation

## 2024-04-07 DIAGNOSIS — D649 Anemia, unspecified: Secondary | ICD-10-CM | POA: Insufficient documentation

## 2024-04-07 DIAGNOSIS — C786 Secondary malignant neoplasm of retroperitoneum and peritoneum: Secondary | ICD-10-CM | POA: Diagnosis not present

## 2024-04-07 DIAGNOSIS — H189 Unspecified disorder of cornea: Secondary | ICD-10-CM | POA: Insufficient documentation

## 2024-04-07 DIAGNOSIS — Z5111 Encounter for antineoplastic chemotherapy: Secondary | ICD-10-CM | POA: Diagnosis not present

## 2024-04-07 DIAGNOSIS — C538 Malignant neoplasm of overlapping sites of cervix uteri: Secondary | ICD-10-CM

## 2024-04-07 DIAGNOSIS — C539 Malignant neoplasm of cervix uteri, unspecified: Secondary | ICD-10-CM | POA: Diagnosis not present

## 2024-04-07 DIAGNOSIS — I10 Essential (primary) hypertension: Secondary | ICD-10-CM | POA: Diagnosis not present

## 2024-04-07 DIAGNOSIS — C7801 Secondary malignant neoplasm of right lung: Secondary | ICD-10-CM | POA: Insufficient documentation

## 2024-04-07 DIAGNOSIS — C7802 Secondary malignant neoplasm of left lung: Secondary | ICD-10-CM | POA: Diagnosis not present

## 2024-04-07 DIAGNOSIS — Z95828 Presence of other vascular implants and grafts: Secondary | ICD-10-CM

## 2024-04-07 DIAGNOSIS — F17210 Nicotine dependence, cigarettes, uncomplicated: Secondary | ICD-10-CM | POA: Insufficient documentation

## 2024-04-07 DIAGNOSIS — Z801 Family history of malignant neoplasm of trachea, bronchus and lung: Secondary | ICD-10-CM | POA: Insufficient documentation

## 2024-04-07 DIAGNOSIS — G629 Polyneuropathy, unspecified: Secondary | ICD-10-CM | POA: Diagnosis not present

## 2024-04-07 DIAGNOSIS — J449 Chronic obstructive pulmonary disease, unspecified: Secondary | ICD-10-CM | POA: Diagnosis not present

## 2024-04-07 LAB — COMPREHENSIVE METABOLIC PANEL WITH GFR
ALT: 18 U/L (ref 0–44)
AST: 44 U/L — ABNORMAL HIGH (ref 15–41)
Albumin: 3.6 g/dL (ref 3.5–5.0)
Alkaline Phosphatase: 83 U/L (ref 38–126)
Anion gap: 15 (ref 5–15)
BUN: 7 mg/dL (ref 6–20)
CO2: 17 mmol/L — ABNORMAL LOW (ref 22–32)
Calcium: 9 mg/dL (ref 8.9–10.3)
Chloride: 97 mmol/L — ABNORMAL LOW (ref 98–111)
Creatinine, Ser: 0.98 mg/dL (ref 0.44–1.00)
GFR, Estimated: >60 mL/min (ref >60)
Glucose, Bld: 78 mg/dL (ref 70–99)
Potassium: 3.7 mmol/L (ref 3.5–5.1)
Sodium: 129 mmol/L — ABNORMAL LOW (ref 135–145)
Total Bilirubin: 0.6 mg/dL (ref 0.0–1.2)
Total Protein: 7.4 g/dL (ref 6.5–8.1)

## 2024-04-07 LAB — CBC WITH DIFFERENTIAL/PLATELET
Abs Immature Granulocytes: 0.07 K/uL (ref 0.00–0.07)
Basophils Absolute: 0 K/uL (ref 0.0–0.1)
Basophils Relative: 0 %
Eosinophils Absolute: 0.1 K/uL (ref 0.0–0.5)
Eosinophils Relative: 1 %
HCT: 30.9 % — ABNORMAL LOW (ref 36.0–46.0)
Hemoglobin: 10.6 g/dL — ABNORMAL LOW (ref 12.0–15.0)
Immature Granulocytes: 1 %
Lymphocytes Relative: 25 %
Lymphs Abs: 2.4 K/uL (ref 0.7–4.0)
MCH: 32.4 pg (ref 26.0–34.0)
MCHC: 34.3 g/dL (ref 30.0–36.0)
MCV: 94.5 fL (ref 80.0–100.0)
Monocytes Absolute: 1.4 K/uL — ABNORMAL HIGH (ref 0.1–1.0)
Monocytes Relative: 15 %
Neutro Abs: 5.5 K/uL (ref 1.7–7.7)
Neutrophils Relative %: 58 %
Platelets: 343 K/uL (ref 150–400)
RBC: 3.27 MIL/uL — ABNORMAL LOW (ref 3.87–5.11)
RDW: 17.2 % — ABNORMAL HIGH (ref 11.5–15.5)
WBC: 9.5 K/uL (ref 4.0–10.5)
nRBC: 0 % (ref 0.0–0.2)

## 2024-04-07 LAB — MAGNESIUM: Magnesium: 1.6 mg/dL — ABNORMAL LOW (ref 1.7–2.4)

## 2024-04-07 MED ORDER — MAGNESIUM SULFATE 2 GM/50ML IV SOLN
2.0000 g | Freq: Once | INTRAVENOUS | Status: AC
  Administered 2024-04-07: 2 g via INTRAVENOUS
  Filled 2024-04-07: qty 50

## 2024-04-07 MED ORDER — ONDANSETRON HCL 4 MG/2ML IJ SOLN
8.0000 mg | Freq: Once | INTRAMUSCULAR | Status: AC
  Administered 2024-04-07: 8 mg via INTRAVENOUS
  Filled 2024-04-07: qty 4

## 2024-04-07 MED ORDER — AMLODIPINE BESYLATE 10 MG PO TABS: 10.0000 mg | ORAL_TABLET | Freq: Every day | ORAL | 3 refills | Status: DC

## 2024-04-07 MED ORDER — SODIUM CHLORIDE 0.9 % IV SOLN
INTRAVENOUS | Status: DC
Start: 2024-04-07 — End: 2024-04-07

## 2024-04-07 MED ORDER — PACLITAXEL PROTEIN-BOUND CHEMO INJECTION 100 MG
125.0000 mg/m2 | Freq: Once | INTRAVENOUS | Status: AC
  Administered 2024-04-07: 200 mg via INTRAVENOUS
  Filled 2024-04-07: qty 40

## 2024-04-07 NOTE — Patient Instructions (Addendum)
 [facility] - [facility]  Discharge Instructions  You were seen and examined today by Dr. Rogers.  Dr. Rogers discussed your most recent lab work which revealed that everything looks good and stable.  Dr. Rogers is repeating CT scan and labs before your next appointment.  Follow-up as scheduled.    Thank you for choosing [facility] to provide your oncology and hematology care.   To afford each patient quality time with our provider, please arrive at least 15 minutes before your scheduled appointment time. You may need to reschedule your appointment if you arrive late (10 or more minutes). Arriving late affects you and other patients whose appointments are after yours.  Also, if you miss three or more appointments without notifying the office, you may be dismissed from the clinic at the provider's discretion.    Again, thank you for choosing [facility].  Our hope is that these requests will decrease the amount of time that you wait before being seen by our physicians.   If you have a lab appointment with the Cancer Center - please note that after April 8th, all labs will be drawn in the cancer center.  You do not have to check in or register with the main entrance as you have in the past but will complete your check-in at the cancer center.            _____________________________________________________________  Should you have questions after your visit to [facility], please contact our office at [phone] and follow the prompts.  Our office hours are 8:00 a.m. to 4:30 p.m. Monday - Thursday and 8:00 a.m. to 2:30 p.m. Friday.  Please note that voicemails left after 4:00 p.m. may not be returned until the following business day.  We are closed weekends and all major holidays.  You do have access to a nurse 24-7, just call the main number to the clinic [phone] and do not press any options, hold on the  line and a nurse will answer the phone.    For prescription refill requests, have your pharmacy contact our office and allow 72 hours.    Masks are no longer required in the cancer centers. If you would like for your care team to wear a mask while they are taking care of you, please let them know. You may have one support person who is at least 51 years old accompany you for your appointments.

## 2024-04-07 NOTE — Progress Notes (Signed)
 Patient presents today for Abraxane  infusion. Patient is in satisfactory condition with no new complaints voiced.  All other vital signs are stable. Patient's HR noted to be 104 today. Labs reviewed by Dr. Rogers during the office visit and all labs are within treatment parameters. Patient will receive 2g IV magnesium  sulfate.  We will proceed with treatment per MD orders.   Treatment given today per MD orders. Tolerated infusion without adverse affects. Vital signs stable. No complaints at this time. Discharged from clinic ambulatory in stable condition. Alert and oriented x 3. F/U with [facility] as scheduled.

## 2024-04-07 NOTE — Progress Notes (Signed)
 [address] [place] [address]    Clinic Day:  04/07/24     Referring physician: [name], FNP  Patient Care Team: [name], FNP as PCP - General (Family Medicine) [name], MD as Medical Oncologist (Medical Oncology) [name], RN as Oncology Nurse Navigator (Medical Oncology)   ASSESSMENT & PLAN:   Assessment: 1.  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 - CTAP (08/30/2022): Irregularity of the endometrium extending through the cervix with foci of gas in the cervix.  Multifocal omental and peritoneal metastasis.  Metastatic left periaortic, aortocaval, left external iliac and right common iliac lymph nodes.  Multiple bilateral lung nodules at bases.  12 mm hypodensity in the liver near the falciform ligament. - Endometrial biopsy (09/09/2022): Invasive moderately to poorly differentiated squamous cell carcinoma with abundant necrosis.  Cervix biopsy: Invasive and in situ moderate to poorly differentiated squamous cell carcinoma.  High risk HPV positive. - NGS: PD-L1 (22 C3): CPS 2, HER2 IHC negative.  PIK3CA exon 10 pathogenic variant.  MS-stable.  TMB-low.  FANCM and SDHA pathogenic variants. - PD-L1 CPS: 8% - Cycle 1 of cisplatin , paclitaxel , bevacizumab  on 10/02/2022, pembrolizumab  added with cycle 2 on 10/23/2022 through 09/17/2023 (cycle 12) discontinued due to progression.  Paclitaxel  was discontinued on 05/12/2023 from neuropathy. - CT CAP (10/18/2022): Increased size and number of bilateral lung nodules.  New subtle hepatic hypodensity suspicious for metastatic disease.  Enlarged left periportal lymph node. 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 - Abraxane  100 mg/m 3 weeks on/1 week off started on 02/08/2024.   2.  Social/family history: - She is married and lives at home with her husband and roommate.  She has never worked.  She is current active smoker, half pack per day, for the last 28 years.  She occasionally smokes marijuana. - Mother died of lung cancer.  Father had head and neck cancer.  Maternal grandmother had cancer.  Paternal uncle died of cancer.  Maternal uncle also had cancer.    Plan: 1.  Metastatic cervical cancer to the lungs, peritoneum and liver: - She has completed Abraxane  cycle 2 which was started on 03/07/2024. - Overall she has tolerated it very well. - Labs today: AST elevated at 44.  Bilirubin is normal.  CBC shows normal white count and platelet count. - She may proceed with cycle 3 today.  Will increase Abraxane  to 125 mg/m on days 1, 8, 15 every 28 days. - RTC 4 weeks for follow-up.  Will arrange for CT CAP for restaging.    2.  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  Hemoglobin today is 10.6.   3.  Peripheral neuropathy: - Neuropathy in the legs and feet is stable.  Continue Lyrica  3 times daily.  Continue to use oxycodone  as needed.   4.  Hypertension: - She ran out of amlodipine .  Blood pressure is high today at 150/87.  Will send refill for amlodipine  10 mg which she will continue taking.  5.  Hypomagnesemia: - Continue magnesium  3 times daily.  Magnesium  is 1.6 today.  6.  Weight loss/decreased appetite: - Continue Megace   twice daily.  Weight is stable.        Orders Placed This Encounter  Procedures   CT CHEST ABDOMEN PELVIS W CONTRAST    Standing Status:   Future    Expected Date:   05/05/2024    Expiration Date:   04/07/2025    If indicated for the ordered procedure, I authorize the administration of contrast media per Radiology protocol:   Yes    Does the  patient have a contrast media/X-ray dye allergy?:   No    Is patient pregnant?:   No    Preferred imaging location?:   [facility]    Release to patient:   Immediate    If indicated for the ordered procedure, I authorize the administration of oral contrast media per Radiology protocol:   Yes      I,Helena R Teague,acting as a scribe for [name], MD.,have documented all relevant documentation on the behalf of [name], MD,as directed by  [name], MD while in the presence of [name], MD.  I, [name] MD, have reviewed the above documentation for accuracy and completeness, and I agree with the above.      [name], MD   8/7/20252:06 PM  CHIEF COMPLAINT:   Diagnosis: metastatic cervical squamous cell carcinoma    Cancer Staging  Cervical cancer [facility]) Staging form: Cervix Uteri, AJCC Version 9 - Clinical stage from 09/19/2022: FIGO Stage IVB (cT4, cN2a, cM1) - Unsigned    Prior Therapy: 1.  Cisplatin , paclitaxel , bevacizumab  and Keytruda  2.  Tivdak  x 3 cycles  Current Therapy: Abraxane  3 weeks on/1 week off   HISTORY OF PRESENT ILLNESS:   Oncology History Overview Note  PD-L1 CPS 8%   Cervical cancer (HCC)  09/19/2022 Initial Diagnosis   Cervical cancer (HCC)   10/02/2022 - 09/17/2023 Chemotherapy   Patient is on Treatment Plan : CERVICAL Cisplatin  50 mg/m2 + PAClitaxel  135 mg/m2 + Bevacizumab  15 mg/kg + Keytruda  q21d     10/29/2023 Genetic Testing   Negative genetic testing on the CancerNext+RNAinsight.  The report date is 10/29/2023.  The [facility] CancerNext+RNAinsight Panel includes sequencing, rearrangement analysis, and RNA analysis for the following 39 genes: APC, ATM, BAP1, BARD1, BMPR1A, BRCA1, BRCA2, BRIP1, CDH1, CDKN2A, CHEK2, FH, FLCN, MET, MLH1, MSH2, MSH6, MUTYH, NF1, NTHL1, PALB2, PMS2, PTEN, RAD51C, RAD51D, SMAD4, STK11, TP53, TSC1, TSC2, and VHL (sequencing and deletion/duplication);  AXIN2, HOXB13, MBD4, MSH3, POLD1 and POLE (sequencing only); EPCAM and GREM1 (deletion/duplication only).   11/12/2023 - 12/24/2023 Chemotherapy   Patient is on Treatment Plan : CERVICAL Tisotumab vedotin -tftv (Tivdak ) q21d     02/08/2024 -  Chemotherapy   Patient is on Treatment Plan : CERVIX PACLitaxel -Albumin (Abraxane ) (100) D1,8,15 q28d     Uterine cancer (HCC)  10/15/2023 Initial Diagnosis   Uterine cancer (HCC)   10/29/2023 Genetic Testing   Negative genetic testing on the CancerNext+RNAinsight.  The report date is 10/29/2023.  The [facility] CancerNext+RNAinsight Panel includes sequencing, rearrangement analysis, and RNA analysis for the following 39 genes: APC, ATM, BAP1, BARD1, BMPR1A, BRCA1, BRCA2, BRIP1, CDH1, CDKN2A, CHEK2, FH, FLCN, MET, MLH1, MSH2, MSH6, MUTYH, NF1, NTHL1, PALB2, PMS2, PTEN, RAD51C, RAD51D, SMAD4, STK11, TP53, TSC1, TSC2, and VHL (sequencing and deletion/duplication); AXIN2, HOXB13, MBD4, MSH3, POLD1 and POLE (sequencing only); EPCAM and GREM1 (deletion/duplication only).      INTERVAL HISTORY:   Coley is a 53 y.o. female presenting to clinic today for follow up of metastatic cervical squamous cell carcinoma. She was last  seen by me on 03/07/2024.  Today, she states that she is doing well overall. Her appetite level is at 0%. Her energy level is at 0%. Kaislee reports she is tolerating treatment well and denies any significant side effects. She notes neuropathic pain in the feet is stable and requiring lyrica  TID. Danecia ran out of amlodipine , as her PCP who prescribes the medication, recently moved and she has yet to establish care with another PCP.   She has missed a few doses of magnesium  which has lowered her magnesium  compared to previous levels.   PAST MEDICAL HISTORY:   Past Medical History: Past Medical History:  Diagnosis Date   Anemia    Asthma    Bronchitis    Cancer (HCC)    Cervical   CHF (congestive heart failure) (HCC)    COPD (chronic obstructive  pulmonary disease) (HCC)    Depression    GERD (gastroesophageal reflux disease)    Headache    Hypertension    Port-A-Cath in place 09/25/2022    Surgical History: Past Surgical History:  Procedure Laterality Date   BREAST SURGERY     CESAREAN SECTION     IR IMAGING GUIDED PORT INSERTION  09/30/2022   LEG SURGERY      Social History: Social History   Socioeconomic History   Marital status: Married    Spouse name: Not on file   Number of children: Not on file   Years of education: Not on file   Highest education level: Not on file  Occupational History   Not on file  Tobacco Use   Smoking status: Every Day    Current packs/day: 0.25    Average packs/day: 0.3 packs/day for 23.0 years (5.8 ttl pk-yrs)    Types: Cigarettes   Smokeless tobacco: Never  Vaping Use   Vaping status: Never Used  Substance and Sexual Activity   Alcohol  use: Yes    Comment: occ   Drug use: Yes    Types: Marijuana   Sexual activity: Yes    Birth control/protection: None  Other Topics Concern   Not on file  Social History Narrative   Not on file   Social Drivers of Health   Financial Resource Strain: Low Risk  (09/09/2022)   Overall Financial Resource Strain (CARDIA)    Difficulty of Paying Living Expenses: Not hard at all  Food Insecurity: No Food Insecurity (01/24/2024)   Hunger Vital Sign    Worried About Running Out of Food in the Last Year: Never true    Ran Out of Food in the Last Year: Never true  Transportation Needs: No Transportation Needs (01/24/2024)   PRAPARE - Administrator, Civil Service (Medical): No    Lack of Transportation (Non-Medical): No  Physical Activity: Insufficiently Active (09/09/2022)   Exercise Vital Sign    Days of Exercise per Week: 2 days    Minutes of Exercise per Session: 20 min  Stress: No Stress Concern Present (09/09/2022)   [employer] of Occupational Health - Occupational Stress Questionnaire    Feeling of Stress : Only a little   Social Connections: Socially Isolated (01/24/2024)   Social Connection and Isolation Panel    Frequency of Communication with Friends and Family: More than three times a week    Frequency of Social Gatherings with Friends and Family: More than three times a week    Attends Religious Services: Never    Database administrator or Organizations: No    Attends  Club or Organization Meetings: Never    Marital Status: Separated  Intimate Partner Violence: Not At Risk (01/24/2024)   Humiliation, Afraid, Rape, and Kick questionnaire    Fear of Current or Ex-Partner: No    Emotionally Abused: No    Physically Abused: No    Sexually Abused: No    Family History: Family History  Problem Relation Age of Onset   Dermatomyositis Mother    Hypertension Mother    Cancer Mother        lung   Cancer Father        lung   Heart disease Father    Lung cancer Paternal Uncle    Lung cancer Maternal Uncle    Colon cancer Neg Hx    Breast cancer Neg Hx    Ovarian cancer Neg Hx    Endometrial cancer Neg Hx    Pancreatic cancer Neg Hx    Prostate cancer Neg Hx     Current Medications:  Current Outpatient Medications:    budesonide -formoterol  (SYMBICORT ) 160-4.5 MCG/ACT inhaler, Inhale 2 puffs into the lungs in the morning and at bedtime., Disp: 1 each, Rfl: 3   levalbuterol  (XOPENEX  HFA) 45 MCG/ACT inhaler, Inhale 2 puffs into the lungs every 8 (eight) hours as needed for wheezing or shortness of breath., Disp: 1 each, Rfl: 2   lidocaine -prilocaine  (EMLA ) cream, Apply a quarter-sized amount to port a cath site and cover with plastic wrap 1 hour prior to infusion appointments, Disp: 30 g, Rfl: 3   magnesium  oxide (MAG-OX) 400 (240 Mg) MG tablet, Take 1 tablet (400 mg total) by mouth 3 (three) times daily., Disp: 90 tablet, Rfl: 3   megestrol  (MEGACE ) 400 MG/10ML suspension, Take 10 mLs (400 mg total) by mouth 2 (two) times daily., Disp: 480 mL, Rfl: 3   oxyCODONE  (OXY IR/ROXICODONE ) 5 MG immediate  release tablet, Take 1 tablet (5 mg total) by mouth every 6 (six) hours as needed for severe pain (pain score 7-10)., Disp: 120 tablet, Rfl: 0   prednisoLONE  5 MG TABS tablet, Take 2 tablets (10 mg total) by mouth daily. Decrease by 2 tablets every 5 days, Disp: 174 tablet, Rfl: 0   prednisoLONE  acetate (PRED FORTE ) 1 % ophthalmic suspension, Place 1 drop into both eyes 2 (two) times daily., Disp: , Rfl:    pregabalin  (LYRICA ) 200 MG capsule, Take 1 capsule (200 mg total) by mouth 3 (three) times daily., Disp: 180 capsule, Rfl: 0   prochlorperazine  (COMPAZINE ) 10 MG tablet, Take 1 tablet (10 mg total) by mouth every 6 (six) hours as needed., Disp: 60 tablet, Rfl: 2   amLODipine  (NORVASC ) 10 MG tablet, Take 1 tablet (10 mg total) by mouth daily., Disp: 90 tablet, Rfl: 3 No current facility-administered medications for this visit.  Facility-Administered Medications Ordered in Other Visits:    0.9 %  sodium chloride  infusion, , Intravenous, Continuous, [name], MD, Last Rate: 10 mL/hr at 04/07/24 1358, New Bag at 04/07/24 1358   magnesium  sulfate IVPB 2 g 50 mL, 2 g, Intravenous, Once, [name], MD   ondansetron  (ZOFRAN ) injection 8 mg, 8 mg, Intravenous, Once, Brettney Ficken, MD   PACLitaxel -protein bound (ABRAXANE ) chemo infusion 200 mg, 125 mg/m2 (Treatment Plan Recorded), Intravenous, Once, [name], MD   Allergies: Allergies  Allergen Reactions   Carrot [Daucus Carota] Hives   Lisinopril -Hydrochlorothiazide      Oral swelling   Ibuprofen  Other (See Comments)    Oral swelling   Other Itching and Other (See Comments)  Hair dye, blisters, pus-filled, soreness   Tramadol  Hives   Erythromycin Hives    REVIEW OF SYSTEMS:   Review of Systems  Constitutional:  Negative for chills, fatigue and fever.  HENT:   Negative for lump/mass, mouth sores, nosebleeds, sore throat and trouble swallowing.   Eyes:  Negative for eye problems.  Respiratory:   Positive for cough and shortness of breath.   Cardiovascular:  Negative for chest pain, leg swelling and palpitations.  Gastrointestinal:  Negative for abdominal pain, constipation, diarrhea, nausea and vomiting.  Genitourinary:  Negative for bladder incontinence, difficulty urinating, dysuria, frequency, hematuria and nocturia.   Musculoskeletal:  Negative for arthralgias, back pain, flank pain, myalgias and neck pain.  Skin:  Positive for rash (on right side of face). Negative for itching.  Neurological:  Positive for numbness (in feet). Negative for dizziness and headaches.  Hematological:  Does not bruise/bleed easily.  Psychiatric/Behavioral:  Positive for sleep disturbance. Negative for depression and suicidal ideas. The patient is not nervous/anxious.   All other systems reviewed and are negative.    VITALS:   Blood pressure (!) 151/85, last menstrual period 08/15/2022.  Wt Readings from Last 3 Encounters:  04/07/24 162 lb (73.5 kg)  03/24/24 169 lb 12.8 oz (77 kg)  03/17/24 164 lb 6.4 oz (74.6 kg)    There is no height or weight on file to calculate BMI.  Performance status (ECOG): 1 - Symptomatic but completely ambulatory  PHYSICAL EXAM:   Physical Exam Vitals and nursing note reviewed. Exam conducted with a chaperone present.  Constitutional:      Appearance: Normal appearance.  Cardiovascular:     Rate and Rhythm: Normal rate and regular rhythm.     Pulses: Normal pulses.     Heart sounds: Normal heart sounds.  Pulmonary:     Effort: Pulmonary effort is normal.     Breath sounds: Normal breath sounds.  Abdominal:     Palpations: Abdomen is soft. There is no hepatomegaly, splenomegaly or mass.     Tenderness: There is no abdominal tenderness.  Musculoskeletal:     Right lower leg: No edema.     Left lower leg: No edema.  Lymphadenopathy:     Cervical: No cervical adenopathy.     Right cervical: No superficial, deep or posterior cervical adenopathy.    Left  cervical: No superficial, deep or posterior cervical adenopathy.     Upper Body:     Right upper body: No supraclavicular or axillary adenopathy.     Left upper body: No supraclavicular or axillary adenopathy.  Neurological:     General: No focal deficit present.     Mental Status: She is alert and oriented to person, place, and time.  Psychiatric:        Mood and Affect: Mood normal.        Behavior: Behavior normal.     LABS:      Latest Ref Rng & Units 04/07/2024   12:37 PM 03/24/2024    9:08 AM 03/17/2024   12:06 PM  CBC  WBC 4.0 - 10.5 K/uL 9.5  6.4  6.3   Hemoglobin 12.0 - 15.0 g/dL 89.3  89.9  89.6   Hematocrit 36.0 - 46.0 % 30.9  30.6  31.6   Platelets 150 - 400 K/uL 343  296  305       Latest Ref Rng & Units 04/07/2024   12:37 PM 03/24/2024    9:08 AM 03/17/2024   12:06 PM  CMP  Glucose 70 - 99 mg/dL 78  899  96   BUN 6 - 20 mg/dL 7  16  14    Creatinine 0.44 - 1.00 mg/dL 9.01  8.88  8.83   Sodium 135 - 145 mmol/L 129  137  135   Potassium 3.5 - 5.1 mmol/L 3.7  3.7  4.0   Chloride 98 - 111 mmol/L 97  104  103   CO2 22 - 32 mmol/L 17  24  21    Calcium  8.9 - 10.3 mg/dL 9.0  9.0  9.2   Total Protein 6.5 - 8.1 g/dL 7.4  7.0  7.3   Total Bilirubin 0.0 - 1.2 mg/dL 0.6  0.3  0.5   Alkaline Phos 38 - 126 U/L 83  51  54   AST 15 - 41 U/L 44  24  21   ALT 0 - 44 U/L 18  10  12       No results found for: CEA1, CEA / No results found for: CEA1, CEA No results found for: PSA1 No results found for: CAN199 No results found for: CAN125  No results found for: TOTALPROTELP, ALBUMINELP, A1GS, A2GS, BETS, BETA2SER, GAMS, MSPIKE, SPEI Lab Results  Component Value Date   TIBC 318 11/12/2023   TIBC 412 09/22/2022   FERRITIN 400 (H) 11/12/2023   FERRITIN 35 09/22/2022   IRONPCTSAT 20 11/12/2023   IRONPCTSAT 7 (L) 09/22/2022   No results found for: LDH   STUDIES:   No results found.

## 2024-04-07 NOTE — Patient Instructions (Signed)
 [facility] [place] - A DEPT OF [facility]  Discharge Instructions: Thank you for choosing [facility] to provide your oncology and hematology care.  If you have a lab appointment with the Cancer Center - please note that after April 8th, 2024, all labs will be drawn in the cancer center.  You do not have to check in or register with the main entrance as you have in the past but will complete your check-in in the cancer center.  Wear comfortable clothing and clothing appropriate for easy access to any Portacath or PICC line.   We strive to give you quality time with your provider. You may need to reschedule your appointment if you arrive late (15 or more minutes).  Arriving late affects you and other patients whose appointments are after yours.  Also, if you miss three or more appointments without notifying the office, you may be dismissed from the clinic at the provider's discretion.      For prescription refill requests, have your pharmacy contact our office and allow 72 hours for refills to be completed.    Today you received the following chemotherapy and/or immunotherapy agents Abraxane      To help prevent nausea and vomiting after your treatment, we encourage you to take your nausea medication as directed.  Paclitaxel  Nanoparticle Albumin-Bound Injection What is this medication? NANOPARTICLE ALBUMIN-BOUND PACLITAXEL  (Na no PAHR ti kuhl al BYOO muhn-bound PAK li TAX el) treats some types of cancer. It works by slowing down the growth of cancer cells. This medicine may be used for other purposes; ask your health care provider or pharmacist if you have questions. COMMON BRAND NAME(S): Abraxane  What should I tell my care team before I take this medication? They need to know if you have any of these conditions: Liver disease Low white blood cell levels An unusual or allergic reaction to paclitaxel , albumin, other medications, foods, dyes, or  preservatives If you or your partner are pregnant or trying to get pregnant Breast-feeding How should I use this medication? This medication is injected into a vein. It is given by your care team in a hospital or clinic setting. Talk to your care team about the use of this medication in children. Special care may be needed. Overdosage: If you think you have taken too much of this medicine contact a poison control center or emergency room at once. NOTE: This medicine is only for you. Do not share this medicine with others. What if I miss a dose? Keep appointments for follow-up doses. It is important not to miss your dose. Call your care team if you are unable to keep an appointment. What may interact with this medication? Other medications may affect the way this medication works. Talk with your care team about all of the medications you take. They may suggest changes to your treatment plan to lower the risk of side effects and to make sure your medications work as intended. This list may not describe all possible interactions. Give your health care provider a list of all the medicines, herbs, non-prescription drugs, or dietary supplements you use. Also tell them if you smoke, drink alcohol , or use illegal drugs. Some items may interact with your medicine. What should I watch for while using this medication? Your condition will be monitored carefully while you are receiving this medication. You may need blood work while taking this medication. This medication may make you feel generally unwell. This is not uncommon as  chemotherapy can affect healthy cells as well as cancer cells. Report any side effects. Continue your course of treatment even though you feel ill unless your care team tells you to stop. This medication can cause serious allergic reactions. To reduce the risk, your care team may give you other medications to take before receiving this one. Be sure to follow the directions from your care  team. This medication may increase your risk of getting an infection. Call your care team for advice if you get a fever, chills, sore throat, or other symptoms of a cold or flu. Do not treat yourself. Try to avoid being around people who are sick. This medication may increase your risk to bruise or bleed. Call your care team if you notice any unusual bleeding. Be careful brushing or flossing your teeth or using a toothpick because you may get an infection or bleed more easily. If you have any dental work done, tell your dentist you are receiving this medication. Talk to your care team if you or your partner may be pregnant. Serious birth defects can occur if you take this medication during pregnancy and for 6 months after the last dose. You will need a negative pregnancy test before starting this medication. Contraception is recommended while taking this medication and for 6 months after the last dose. Your care team can help you find the option that works for you. If your partner can get pregnant, use a condom during sex while taking this medication and for 3 months after the last dose. Do not breastfeed while taking this medication and for 2 weeks after the last dose. This medication may cause infertility. Talk to your care team if you are concerned about your fertility. What side effects may I notice from receiving this medication? Side effects that you should report to your care team as soon as possible: Allergic reactions--skin rash, itching, hives, swelling of the face, lips, tongue, or throat Dry cough, shortness of breath or trouble breathing Infection--fever, chills, cough, sore throat, wounds that don't heal, pain or trouble when passing urine, general feeling of discomfort or being unwell Low red blood cell level--unusual weakness or fatigue, dizziness, headache, trouble breathing Pain, tingling, or numbness in the hands or feet Stomach pain, unusual weakness or fatigue, nausea, vomiting,  diarrhea, or fever that lasts longer than expected Unusual bruising or bleeding Side effects that usually do not require medical attention (report to your care team if they continue or are bothersome): Diarrhea Fatigue Hair loss Loss of appetite Nausea Vomiting This list may not describe all possible side effects. Call your doctor for medical advice about side effects. You may report side effects to FDA at 1-800-FDA-1088. Where should I keep my medication? This medication is given in a hospital or clinic. It will not be stored at home. NOTE: This sheet is a summary. It may not cover all possible information. If you have questions about this medicine, talk to your doctor, pharmacist, or health care provider.  2024 Elsevier/Gold Standard (2022-01-02 00:00:00)  BELOW ARE SYMPTOMS THAT SHOULD BE REPORTED IMMEDIATELY: *FEVER GREATER THAN 100.4 F (38 C) OR HIGHER *CHILLS OR SWEATING *NAUSEA AND VOMITING THAT IS NOT CONTROLLED WITH YOUR NAUSEA MEDICATION *UNUSUAL SHORTNESS OF BREATH *UNUSUAL BRUISING OR BLEEDING *URINARY PROBLEMS (pain or burning when urinating, or frequent urination) *BOWEL PROBLEMS (unusual diarrhea, constipation, pain near the anus) TENDERNESS IN MOUTH AND THROAT WITH OR WITHOUT PRESENCE OF ULCERS (sore throat, sores in mouth, or a toothache) UNUSUAL RASH, SWELLING OR  PAIN  UNUSUAL VAGINAL DISCHARGE OR ITCHING   Items with * indicate a potential emergency and should be followed up as soon as possible or go to the Emergency Department if any problems should occur.  Please show the CHEMOTHERAPY ALERT CARD or IMMUNOTHERAPY ALERT CARD at check-in to the Emergency Department and triage nurse.  Should you have questions after your visit or need to cancel or reschedule your appointment, please contact [facility] [place] - A DEPT OF [facility] [phone]  and follow the prompts.  Office hours are 8:00 a.m. to 4:30 p.m. Monday - Friday. Please note  that voicemails left after 4:00 p.m. may not be returned until the following business day.  We are closed weekends and major holidays. You have access to a nurse at all times for urgent questions. Please call the main number to the clinic [phone] and follow the prompts.  For any non-urgent questions, you may also contact your provider using MyChart. We now offer e-Visits for anyone 24 and older to request care online for non-urgent symptoms. For details visit [email or web address].   Also download the MyChart app! Go to the app store, search MyChart, open the app, select [place], and log in with your MyChart username and password.

## 2024-04-07 NOTE — Progress Notes (Signed)
 Maintain Abraxane  dose at 200 mg despite slight weight increase.  MD increasing today's dose to 125 mg/m2  V.O. Dr Theadore Molt, PharmD

## 2024-04-07 NOTE — Progress Notes (Signed)
   04/07/24 1500  Spiritual Encounters  Type of Visit Initial  Care provided to: Patient  Referral source Nurse (RN/NT/LPN)  Reason for visit Urgent spiritual support  OnCall Visit No  Spiritual Framework  Presenting Themes Significant life change;Caregiving needs;Coping tools;Impactful experiences and emotions;Courage hope and growth  Community/Connection Family  Patient Stress Factors Family relationships;Major life changes  Family Stress Factors Not reviewed  Interventions  Spiritual Care Interventions Made Established relationship of care and support;Compassionate presence;Reflective listening;Normalization of emotions;Narrative/life review;Explored values/beliefs/practices/strengths;Bereavement/grief support;Prayer;Encouragement;Self-care teaching  Intervention Outcomes  Outcomes Connection to spiritual care;Awareness of support;Reduced anxiety;Reduced isolation  Spiritual Care Plan  Spiritual Care Issues Still Outstanding Chaplain will continue to follow   Reason for Visit: Chaplain received referral from RN who said Pt was tearful and emotional upon check in and they asked if she would like to speak with Chaplain. Pt wanted to talk.  Description of Visit: Upon arrival to the infusion room, Kristin Carson was seated in the recliner, receiving treatment, with no support person present.  When I asked her to tell me what is going on she became tearful and stated that she is going through a tough divorce and she can't eat or sleep or function.  Chaplain asked guided questions to explore the situation and Kristin Carson's emotions within it.  Chaplain provided silent and supportive presence while Kristin Carson told the story of her pain and unloaded the many emotions it produced.  Chaplain provided grief support as well as some strategies for better sleep and for anxiety management, while continuing to listen empathetically.  Before concluding, Kristin Carson asked for prayer which I readily provided.  Plan of Care:  Spiritual Care will continue to follow and support Kristin Carson on a weekly basis.   Maude Roll, MDiv  Chaplain, [facility] [email or web address] [phone]

## 2024-04-08 DIAGNOSIS — J449 Chronic obstructive pulmonary disease, unspecified: Secondary | ICD-10-CM | POA: Diagnosis not present

## 2024-04-09 DIAGNOSIS — J449 Chronic obstructive pulmonary disease, unspecified: Secondary | ICD-10-CM | POA: Diagnosis not present

## 2024-04-10 ENCOUNTER — Other Ambulatory Visit: Payer: Self-pay

## 2024-04-11 ENCOUNTER — Inpatient Hospital Stay

## 2024-04-11 DIAGNOSIS — J449 Chronic obstructive pulmonary disease, unspecified: Secondary | ICD-10-CM | POA: Diagnosis not present

## 2024-04-12 DIAGNOSIS — Z419 Encounter for procedure for purposes other than remedying health state, unspecified: Secondary | ICD-10-CM | POA: Diagnosis not present

## 2024-04-12 DIAGNOSIS — J449 Chronic obstructive pulmonary disease, unspecified: Secondary | ICD-10-CM | POA: Diagnosis not present

## 2024-04-13 DIAGNOSIS — J449 Chronic obstructive pulmonary disease, unspecified: Secondary | ICD-10-CM | POA: Diagnosis not present

## 2024-04-14 ENCOUNTER — Inpatient Hospital Stay

## 2024-04-14 VITALS — BP 132/76 | HR 96 | Temp 98.3°F | Resp 18

## 2024-04-14 DIAGNOSIS — G629 Polyneuropathy, unspecified: Secondary | ICD-10-CM | POA: Diagnosis not present

## 2024-04-14 DIAGNOSIS — Z95828 Presence of other vascular implants and grafts: Secondary | ICD-10-CM

## 2024-04-14 DIAGNOSIS — C7801 Secondary malignant neoplasm of right lung: Secondary | ICD-10-CM | POA: Diagnosis not present

## 2024-04-14 DIAGNOSIS — C787 Secondary malignant neoplasm of liver and intrahepatic bile duct: Secondary | ICD-10-CM | POA: Diagnosis not present

## 2024-04-14 DIAGNOSIS — C786 Secondary malignant neoplasm of retroperitoneum and peritoneum: Secondary | ICD-10-CM | POA: Diagnosis not present

## 2024-04-14 DIAGNOSIS — D649 Anemia, unspecified: Secondary | ICD-10-CM | POA: Diagnosis not present

## 2024-04-14 DIAGNOSIS — C538 Malignant neoplasm of overlapping sites of cervix uteri: Secondary | ICD-10-CM

## 2024-04-14 DIAGNOSIS — H189 Unspecified disorder of cornea: Secondary | ICD-10-CM | POA: Diagnosis not present

## 2024-04-14 DIAGNOSIS — C539 Malignant neoplasm of cervix uteri, unspecified: Secondary | ICD-10-CM | POA: Diagnosis not present

## 2024-04-14 DIAGNOSIS — J449 Chronic obstructive pulmonary disease, unspecified: Secondary | ICD-10-CM | POA: Diagnosis not present

## 2024-04-14 DIAGNOSIS — F17210 Nicotine dependence, cigarettes, uncomplicated: Secondary | ICD-10-CM | POA: Diagnosis not present

## 2024-04-14 DIAGNOSIS — Z5111 Encounter for antineoplastic chemotherapy: Secondary | ICD-10-CM | POA: Diagnosis not present

## 2024-04-14 DIAGNOSIS — C7802 Secondary malignant neoplasm of left lung: Secondary | ICD-10-CM | POA: Diagnosis not present

## 2024-04-14 DIAGNOSIS — Z79899 Other long term (current) drug therapy: Secondary | ICD-10-CM | POA: Diagnosis not present

## 2024-04-14 DIAGNOSIS — I10 Essential (primary) hypertension: Secondary | ICD-10-CM | POA: Diagnosis not present

## 2024-04-14 LAB — CBC WITH DIFFERENTIAL/PLATELET
Abs Immature Granulocytes: 0.04 K/uL (ref 0.00–0.07)
Basophils Absolute: 0 K/uL (ref 0.0–0.1)
Basophils Relative: 0 %
Eosinophils Absolute: 0.1 K/uL (ref 0.0–0.5)
Eosinophils Relative: 1 %
HCT: 27.2 % — ABNORMAL LOW (ref 36.0–46.0)
Hemoglobin: 9.3 g/dL — ABNORMAL LOW (ref 12.0–15.0)
Immature Granulocytes: 1 %
Lymphocytes Relative: 30 %
Lymphs Abs: 2.2 K/uL (ref 0.7–4.0)
MCH: 32.2 pg (ref 26.0–34.0)
MCHC: 34.2 g/dL (ref 30.0–36.0)
MCV: 94.1 fL (ref 80.0–100.0)
Monocytes Absolute: 0.7 K/uL (ref 0.1–1.0)
Monocytes Relative: 10 %
Neutro Abs: 4.3 K/uL (ref 1.7–7.7)
Neutrophils Relative %: 58 %
Platelets: 222 K/uL (ref 150–400)
RBC: 2.89 MIL/uL — ABNORMAL LOW (ref 3.87–5.11)
RDW: 17.2 % — ABNORMAL HIGH (ref 11.5–15.5)
WBC: 7.3 K/uL (ref 4.0–10.5)
nRBC: 0 % (ref 0.0–0.2)

## 2024-04-14 LAB — MAGNESIUM: Magnesium: 1.1 mg/dL — ABNORMAL LOW (ref 1.7–2.4)

## 2024-04-14 LAB — COMPREHENSIVE METABOLIC PANEL WITH GFR
ALT: 22 U/L (ref 0–44)
AST: 34 U/L (ref 15–41)
Albumin: 3.1 g/dL — ABNORMAL LOW (ref 3.5–5.0)
Alkaline Phosphatase: 70 U/L (ref 38–126)
Anion gap: 12 (ref 5–15)
BUN: 7 mg/dL (ref 6–20)
CO2: 25 mmol/L (ref 22–32)
Calcium: 9 mg/dL (ref 8.9–10.3)
Chloride: 95 mmol/L — ABNORMAL LOW (ref 98–111)
Creatinine, Ser: 0.9 mg/dL (ref 0.44–1.00)
GFR, Estimated: >60 mL/min (ref >60)
Glucose, Bld: 86 mg/dL (ref 70–99)
Potassium: 3.8 mmol/L (ref 3.5–5.1)
Sodium: 132 mmol/L — ABNORMAL LOW (ref 135–145)
Total Bilirubin: 0.5 mg/dL (ref 0.0–1.2)
Total Protein: 7 g/dL (ref 6.5–8.1)

## 2024-04-14 MED ORDER — SODIUM CHLORIDE 0.9 % IV SOLN: INTRAVENOUS | Status: DC

## 2024-04-14 MED ORDER — PACLITAXEL PROTEIN-BOUND CHEMO INJECTION 100 MG
126.0000 mg/m2 | Freq: Once | INTRAVENOUS | Status: AC
  Administered 2024-04-14: 225 mg via INTRAVENOUS
  Filled 2024-04-14: qty 45

## 2024-04-14 MED ORDER — OXYCODONE HCL 5 MG PO TABS
5.0000 mg | ORAL_TABLET | Freq: Once | ORAL | Status: AC
  Administered 2024-04-14: 5 mg via ORAL
  Filled 2024-04-14: qty 1

## 2024-04-14 MED ORDER — MAGNESIUM SULFATE 4 GM/100ML IV SOLN
4.0000 g | Freq: Once | INTRAVENOUS | Status: AC
  Administered 2024-04-14: 4 g via INTRAVENOUS
  Filled 2024-04-14: qty 100

## 2024-04-14 MED ORDER — ONDANSETRON HCL 4 MG/2ML IJ SOLN
8.0000 mg | Freq: Once | INTRAMUSCULAR | Status: AC
  Administered 2024-04-14: 8 mg via INTRAVENOUS
  Filled 2024-04-14: qty 4

## 2024-04-14 NOTE — Progress Notes (Signed)
 Magnesium  1.1 today.  Magnesium  4 grams per standing orders Dr. Davonna.   Patient tolerated chemotherapy with no complaints voiced.  Side effects with management reviewed with understanding verbalized.  Port site clean and dry with no bruising or swelling noted at site.  Good blood return noted before and after administration of chemotherapy.  Band aid applied.  Patient left in satisfactory condition with VSS and no s/s of distress noted.

## 2024-04-14 NOTE — Progress Notes (Signed)
 New BSA: 1.85 m2  Adjust Abraxane  dose to 125 mg/m2 = 225 mg  V.O. Dr Ivery Molt, PharmD

## 2024-04-14 NOTE — Patient Instructions (Signed)
 [facility] [place] - A DEPT OF [facility]  Discharge Instructions: Thank you for choosing [facility] to provide your oncology and hematology care.  If you have a lab appointment with the Cancer Center - please note that after April 8th, 2024, all labs will be drawn in the cancer center.  You do not have to check in or register with the main entrance as you have in the past but will complete your check-in in the cancer center.  Wear comfortable clothing and clothing appropriate for easy access to any Portacath or PICC line.   We strive to give you quality time with your provider. You may need to reschedule your appointment if you arrive late (15 or more minutes).  Arriving late affects you and other patients whose appointments are after yours.  Also, if you miss three or more appointments without notifying the office, you may be dismissed from the clinic at the provider's discretion.      For prescription refill requests, have your pharmacy contact our office and allow 72 hours for refills to be completed.    Today you received the following chemotherapy and/or immunotherapy agents Abraxane .       To help prevent nausea and vomiting after your treatment, we encourage you to take your nausea medication as directed.  BELOW ARE SYMPTOMS THAT SHOULD BE REPORTED IMMEDIATELY: *FEVER GREATER THAN 100.4 F (38 C) OR HIGHER *CHILLS OR SWEATING *NAUSEA AND VOMITING THAT IS NOT CONTROLLED WITH YOUR NAUSEA MEDICATION *UNUSUAL SHORTNESS OF BREATH *UNUSUAL BRUISING OR BLEEDING *URINARY PROBLEMS (pain or burning when urinating, or frequent urination) *BOWEL PROBLEMS (unusual diarrhea, constipation, pain near the anus) TENDERNESS IN MOUTH AND THROAT WITH OR WITHOUT PRESENCE OF ULCERS (sore throat, sores in mouth, or a toothache) UNUSUAL RASH, SWELLING OR PAIN  UNUSUAL VAGINAL DISCHARGE OR ITCHING   Items with * indicate a potential emergency and should be followed  up as soon as possible or go to the Emergency Department if any problems should occur.  Please show the CHEMOTHERAPY ALERT CARD or IMMUNOTHERAPY ALERT CARD at check-in to the Emergency Department and triage nurse.  Should you have questions after your visit or need to cancel or reschedule your appointment, please contact [facility] [place] - A DEPT OF [facility] [phone]  and follow the prompts.  Office hours are 8:00 a.m. to 4:30 p.m. Monday - Friday. Please note that voicemails left after 4:00 p.m. may not be returned until the following business day.  We are closed weekends and major holidays. You have access to a nurse at all times for urgent questions. Please call the main number to the clinic [phone] and follow the prompts.  For any non-urgent questions, you may also contact your provider using MyChart. We now offer e-Visits for anyone 18 and older to request care online for non-urgent symptoms. For details visit [email or web address].   Also download the MyChart app! Go to the app store, search MyChart, open the app, select [place], and log in with your MyChart username and password.

## 2024-04-14 NOTE — Progress Notes (Signed)
 Patients port flushed without difficulty.  Good blood return noted with no bruising or swelling noted at site.  Patient remains accessed for treatment. Patient in tears and c/o pain in feet bilaterally in which she takes Oxycodone  5mg  at home but was unable to due to transportation arriving early. Dr Davonna made aware.

## 2024-04-15 DIAGNOSIS — J449 Chronic obstructive pulmonary disease, unspecified: Secondary | ICD-10-CM | POA: Diagnosis not present

## 2024-04-15 NOTE — Progress Notes (Signed)
   04/15/24 0900  Spiritual Encounters  Type of Visit Attempt (pt unavailable)  Referral source Chaplain assessment  Reason for visit Routine spiritual support  OnCall Visit No   Chaplain attempting follow-up for continuing care via telephone.  Chaplain attempted to call [place] at the cell number listed in the chart.  Call went to voicemail and chaplain left a message.  Will attempt to call again this afternoon.  Maude Roll, MDiv  Chaplain, [facility] [email or web address] [phone]

## 2024-04-16 DIAGNOSIS — J449 Chronic obstructive pulmonary disease, unspecified: Secondary | ICD-10-CM | POA: Diagnosis not present

## 2024-04-18 ENCOUNTER — Inpatient Hospital Stay

## 2024-04-18 DIAGNOSIS — J449 Chronic obstructive pulmonary disease, unspecified: Secondary | ICD-10-CM | POA: Diagnosis not present

## 2024-04-19 DIAGNOSIS — J449 Chronic obstructive pulmonary disease, unspecified: Secondary | ICD-10-CM | POA: Diagnosis not present

## 2024-04-20 DIAGNOSIS — J449 Chronic obstructive pulmonary disease, unspecified: Secondary | ICD-10-CM | POA: Diagnosis not present

## 2024-04-21 ENCOUNTER — Inpatient Hospital Stay

## 2024-04-21 DIAGNOSIS — J449 Chronic obstructive pulmonary disease, unspecified: Secondary | ICD-10-CM | POA: Diagnosis not present

## 2024-04-22 DIAGNOSIS — J449 Chronic obstructive pulmonary disease, unspecified: Secondary | ICD-10-CM | POA: Diagnosis not present

## 2024-04-23 DIAGNOSIS — J449 Chronic obstructive pulmonary disease, unspecified: Secondary | ICD-10-CM | POA: Diagnosis not present

## 2024-04-25 ENCOUNTER — Ambulatory Visit (HOSPITAL_COMMUNITY)
Admission: RE | Admit: 2024-04-25 | Discharge: 2024-04-25 | Disposition: A | Discharge status: Home / Self Care | Source: Ambulatory Visit | Attending: Hematology | Admitting: Hematology

## 2024-04-25 DIAGNOSIS — C538 Malignant neoplasm of overlapping sites of cervix uteri: Secondary | ICD-10-CM | POA: Diagnosis not present

## 2024-04-25 DIAGNOSIS — I700 Atherosclerosis of aorta: Secondary | ICD-10-CM | POA: Diagnosis not present

## 2024-04-25 DIAGNOSIS — C787 Secondary malignant neoplasm of liver and intrahepatic bile duct: Secondary | ICD-10-CM | POA: Diagnosis not present

## 2024-04-25 DIAGNOSIS — J449 Chronic obstructive pulmonary disease, unspecified: Secondary | ICD-10-CM | POA: Diagnosis not present

## 2024-04-25 DIAGNOSIS — N3289 Other specified disorders of bladder: Secondary | ICD-10-CM | POA: Diagnosis not present

## 2024-04-25 MED ORDER — IOHEXOL 300 MG/ML  SOLN
100.0000 mL | Freq: Once | INTRAMUSCULAR | Status: AC | PRN
  Administered 2024-04-25: 100 mL via INTRAVENOUS

## 2024-04-26 DIAGNOSIS — J449 Chronic obstructive pulmonary disease, unspecified: Secondary | ICD-10-CM | POA: Diagnosis not present

## 2024-04-27 DIAGNOSIS — J449 Chronic obstructive pulmonary disease, unspecified: Secondary | ICD-10-CM | POA: Diagnosis not present

## 2024-04-28 DIAGNOSIS — J449 Chronic obstructive pulmonary disease, unspecified: Secondary | ICD-10-CM | POA: Diagnosis not present

## 2024-04-29 ENCOUNTER — Other Ambulatory Visit: Payer: Self-pay

## 2024-04-29 DIAGNOSIS — G622 Polyneuropathy due to other toxic agents: Secondary | ICD-10-CM

## 2024-04-29 DIAGNOSIS — J449 Chronic obstructive pulmonary disease, unspecified: Secondary | ICD-10-CM | POA: Diagnosis not present

## 2024-04-29 MED ORDER — OXYCODONE HCL 5 MG PO TABS: 5.0000 mg | ORAL_TABLET | Freq: Four times a day (QID) | ORAL | 0 refills | Status: DC | PRN

## 2024-04-29 MED ORDER — PREGABALIN 200 MG PO CAPS: 200.0000 mg | ORAL_CAPSULE | Freq: Three times a day (TID) | ORAL | 0 refills | Status: DC

## 2024-04-30 DIAGNOSIS — J449 Chronic obstructive pulmonary disease, unspecified: Secondary | ICD-10-CM | POA: Diagnosis not present

## 2024-05-02 DIAGNOSIS — J449 Chronic obstructive pulmonary disease, unspecified: Secondary | ICD-10-CM | POA: Diagnosis not present

## 2024-05-03 ENCOUNTER — Inpatient Hospital Stay

## 2024-05-03 ENCOUNTER — Other Ambulatory Visit: Payer: Self-pay | Admitting: *Deleted

## 2024-05-03 ENCOUNTER — Inpatient Hospital Stay: Attending: Hematology | Admitting: Oncology

## 2024-05-03 DIAGNOSIS — J449 Chronic obstructive pulmonary disease, unspecified: Secondary | ICD-10-CM | POA: Diagnosis not present

## 2024-05-04 ENCOUNTER — Other Ambulatory Visit: Payer: Self-pay | Admitting: *Deleted

## 2024-05-04 DIAGNOSIS — C538 Malignant neoplasm of overlapping sites of cervix uteri: Secondary | ICD-10-CM

## 2024-05-04 DIAGNOSIS — J449 Chronic obstructive pulmonary disease, unspecified: Secondary | ICD-10-CM | POA: Diagnosis not present

## 2024-05-04 DIAGNOSIS — Z95828 Presence of other vascular implants and grafts: Secondary | ICD-10-CM

## 2024-05-04 NOTE — Progress Notes (Incomplete)
 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 female was transferred to my care after her prior physician has left.   ASSESSMENT & PLAN:   Assessment & Plan: Kristin Carson  is a 53 y.o. female with metastatic squamous cell carcinoma of cervix to the  Lungs, peritoneum and liver  Assessment & Plan Malignant neoplasm of overlapping sites of cervix (HCC) Met static cervical carcinoma-static to lungs, liver and peritoneum First-line: Cisplatin /paclitaxel /bevacizumab /pembrolizumab  (added second cycle). Cisplatin  changed to carboplatin  for the last 3 cycles Second line: Tivdek Third line: Abraxane  Caris NGS: PD-L1: Positive, CPS: 2, no other actionable mutations PD-L1 CPS score of 8 on pathology  - We discussed and reviewed the latest CT scan in detail.  Very evident with progression of disease in the lungs and liver - Will obtain a biopsy of the lung to ensure that this is still cervical carcinoma and to rule out other primary lung carcinoma as patient is a smoker - Discussed in detail with her further options would be a single agent chemotherapies that do not have a lot of data.  Also can consider a retrial of single agent pembrolizumab  as she had PD-L1 positive disease with CPS of 8 and had prolonged control of disease with chemo and immunotherapy at the beginning.   - Patient is okay to do a trial of immunotherapy until we have the results back.  Risks versus benefits discussed in detail again.  Side effects discussed in detail including rash, thyroid  abnormalities, diarrhea, pneumonitis and colitis - Will consider another line of chemotherapy  with gemcitabine or consider single agent Cemiplimab  if patient has worsening symptoms at next visit - Will obtain Caris assure to evaluate for any new actionable mutations  Return to clinic in 3 weeks for next cycle and to reevaluate other options. Peripheral neuropathy due to chemotherapy [facility]) Patient reports stable pain in her hands and feet that is controlled with Lyrica   - Continue Lyrica  as prescribed Tobacco use Patient is a current smoker and is very motivated to quit  - Encouraged smoking cessation efforts Goals of care, counseling/discussion Discussed goals of care in detail considering patient has a stage IV cervical cancer and is on fourth line of treatment  - Patient at this time would like to live as long as possible and would want to be full code. - Patient would like to try any treatment options available at this time.    The patient understands the plans discussed today and is in agreement with them.  She knows to contact our office if she develops concerns prior to her next appointment.  60 minutes of total time was spent for this patient encounter, including preparation,review of records,  face-to-face counseling with the patient and coordination of care, physical exam, and documentation of the encounter.    Kristin Carson,acting as a Neurosurgeon for Mickiel Dry, MD.,have documented all relevant documentation on the behalf of Mickiel Dry, MD,as directed by  Mickiel Dry, MD while in the presence of Mickiel Dry, MD.  I, Mickiel Dry MD, have reviewed the above documentation for accuracy and completeness, and I agree with the above.    Mickiel Dry, MD  [facility]  [facility] [place] - A DEPT OF [facility] [address] [place] [address] Dept: [phone] Dept Fax: [phone]   Orders Placed This Encounter  Procedures   CBC with Differential    Standing Status:   Future    Expected Date:   05/06/2024    Expiration Date:   05/06/2025    Comprehensive metabolic panel    Standing Status:   Future    Expected Date:   05/06/2024    Expiration Date:   05/06/2025   T4    Standing Status:   Future    Expected Date:   05/06/2024    Expiration Date:   05/06/2025   TSH    Standing Status:   Future    Number of Occurrences:   1    Expected Date:   05/06/2024    Expiration Date:   05/06/2025   CBC with Differential    Standing Status:   Future    Expected Date:   05/27/2024    Expiration Date:   05/27/2025   Comprehensive metabolic panel    Standing Status:   Future    Expected Date:   05/27/2024    Expiration Date:   05/27/2025     ONCOLOGY HISTORY:   I have reviewed her chart and materials related to her cancer extensively and collaborated history with the patient. 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 Foci of gas in the cervix which may reflect fistulous connection versus sequela of recent instrumentation. Metastatic abdominopelvic lymph nodes with peritoneal carcinomatosis and a small volume abdominopelvic ascites. Pulmonary nodules in the bilateral lung bases are concerning for metastatic disease involvement. Segment IV hepatic hypodensity near the falciform ligament measuring 12 mm is nonspecific but suspicious for metastatic disease although this is a common location for focal fatty infiltration. -09/09/2022: Endometrial and cervical biopsy.  -Endometrium Pathology: Invasive moderate to poorly differentiated squamous cell carcinoma with abundant necrosis. Background extensive severe  squamous dysplasia to carcinoma in situ. Fragments of mature adipose. Marked chronic endocervicitis with squamous metaplasia.  -Cervix Pathology: Invasive and in situ moderate to poorly differentiated squamous cell carcinoma.  -Cytology of pap smear: positive for high grade squamous intraepithelial lesion and HPV 18/45.  -09/22/2022: PD-L1 CPS: 8%. -10/02/2022-09/17/2023: Cisplatin , paclitaxel , bevacizumab  with pembrolizumab  added to regimen with cycle 2 on 10/23/2022 and paclitaxel  discontinued on 05/12/2023 secondary to neuropathy. Cisplatin  changed to carboplatin  (06/25/2023). Regimen discontinued due to progression -10/10/2022:Caris NGS: PD-L1 (22 C3): positive CPS 2, HER2 IHC negative. PIK3CA exon 10 pathogenic variant.  MSI-stable, pMMR.  TMB-low, 75mut/Mb.  FANCM and SDHA pathogenic variants.  BRAF, NTRK 1/2/3, RET: Negative -10/14/2022: Body Scan: No definite evidence for skeletal metastatic disease.  -10/19/2023: Germline mutation testing: Negative -10/19/2023: CT CAP: Increased size and number of bilateral pulmonary nodules, consistent with worsening metastatic disease. New subtle hepatic hypodensities, suspicious for metastatic disease. New fluid and irregular nodular enhancement in the endometrial canal, suspicious for recurrent disease. Enlarged left periaortic lymph node, suspicious for metastatic disease. -11/12/2023-12/24/2023: 3 cycles of Tisotumab, discontinued due to progression -01/23/2024: CT AP: Progression  of endometrial carcinoma since 10/19/2023 with increased distension of the endometrial canal now measuring 6.1 cm. Worsening hepatic and pulmonary metastases. Similar left para-aortic lymphadenopathy. -02/08/2024-current: Abraxane  100 mg/m 3 weeks on/1 week off -04/25/2024: CT CAP: Increased size of the bilateral pulmonary nodules/masses and new/increased size of the bilobar hepatic metastasis, consistent with worsening metastatic disease. Increased fluid and nodularity in the  endometrial canal. New left adnexal cyst measures 2.2 cm, nonspecific. Increased size of the left periaortic lymph node with internal calcifications, consistent with metastatic disease.  Current Treatment:  TBD  INTERVAL HISTORY:   Kristin Carson is here today for follow up and establish care with me. Patient is accompanied by her husband today.  Patient reports feeling well and has no complaints today.  Has some mild abdominal pain but is at baseline.  Denies nausea, vomiting, weight loss, loss of appetite.  Tolerating chemotherapy well.  She experiences numbness and tingling in her hands and feet, which has improved with Lyrica . However, she notes swelling in her feet. No abdominal pain or other significant complaints are reported at this time.  We discussed the latest CT scan findings in detail.  Scans show progression of disease in the lungs and new liver lesions.She is concerned about the progression and its impact on her life, expressing a desire to live for her grandchildren.  We discussed further treatment options in detail and that her options are limited at this time considering she has gone through 2 months of treatment.  We also discussed focusing on importance of spending time with her family and making memories.She has a strong desire to continue treatment. She acknowledges the need to quit smoking.  I have reviewed the past medical history, past surgical history, social history and family history with the patient and they are unchanged from previous note.  ALLERGIES:  is allergic to carrot [daucus carota], lisinopril -hydrochlorothiazide , ibuprofen , other, tramadol , and erythromycin.  MEDICATIONS:  Current Outpatient Medications  Medication Sig Dispense Refill   amLODipine  (NORVASC ) 10 MG tablet Take 1 tablet (10 mg total) by mouth daily. 90 tablet 3   budesonide -formoterol  (SYMBICORT ) 160-4.5 MCG/ACT inhaler Inhale 2 puffs into the lungs in the morning and at bedtime. 1 each 3    levalbuterol  (XOPENEX  HFA) 45 MCG/ACT inhaler Inhale 2 puffs into the lungs every 8 (eight) hours as needed for wheezing or shortness of breath. 1 each 2   lidocaine -prilocaine  (EMLA ) cream Apply a quarter-sized amount to port a cath site and cover with plastic wrap 1 hour prior to infusion appointments 30 g 3   magnesium  oxide (MAG-OX) 400 (240 Mg) MG tablet Take 1 tablet (400 mg total) by mouth 3 (three) times daily. 90 tablet 3   megestrol  (MEGACE ) 400 MG/10ML suspension Take 10 mLs (400 mg total) by mouth 2 (two) times daily. 480 mL 3   oxyCODONE  (OXY IR/ROXICODONE ) 5 MG immediate release tablet Take 1 tablet (5 mg total) by mouth every 6 (six) hours as needed for severe pain (pain score 7-10). 120 tablet 0   prednisoLONE  acetate (PRED FORTE ) 1 % ophthalmic suspension Place 1 drop into both eyes 2 (two) times daily.     pregabalin  (LYRICA ) 200 MG capsule Take 1 capsule (200 mg total) by mouth 3 (three) times daily. 180 capsule 0   prochlorperazine  (COMPAZINE ) 10 MG tablet Take 1 tablet (10 mg total) by mouth every 6 (six) hours as needed. 60 tablet 2   No current facility-administered medications for this visit.   Facility-Administered Medications Ordered in  Other Visits  Medication Dose Route Frequency Provider Last Rate Last Admin   0.9 %  sodium chloride  infusion   Intravenous Continuous Aidyn Sportsman, MD   Stopped at 05/05/24 1236    REVIEW OF SYSTEMS:   Constitutional: Denies fevers, chills or abnormal weight loss Eyes: Denies blurriness of vision Ears, nose, mouth, throat, and face: Denies mucositis or sore throat Respiratory: Denies cough, dyspnea or wheezes Cardiovascular: Denies palpitation, chest discomfort or lower extremity swelling Gastrointestinal:  Denies nausea, heartburn or change in bowel habits Skin: Denies abnormal skin rashes Lymphatics: Denies new lymphadenopathy or easy bruising Neurological:Denies numbness, tingling or new weaknesses Behavioral/Psych: Mood is  stable, no new changes  All other systems were reviewed with the patient and are negative.   VITALS:  Last menstrual period 08/15/2022.  Wt Readings from Last 3 Encounters:  05/05/24 168 lb 3.2 oz (76.3 kg)  04/14/24 164 lb (74.4 kg)  04/07/24 162 lb (73.5 kg)    There is no height or weight on file to calculate BMI.  Performance status (ECOG): 1 - Symptomatic but completely ambulatory  PHYSICAL EXAM:   GENERAL:alert, no distress and comfortable SKIN: skin color, texture, turgor are normal, no rashes or significant lesions LYMPH:  no palpable lymphadenopathy in the cervical, axillary or inguinal LUNGS: clear to auscultation and percussion with normal breathing effort HEART: regular rate & rhythm and no murmurs and no lower extremity edema ABDOMEN:abdomen soft, non-tender and normal bowel sounds Musculoskeletal:no cyanosis of digits and no clubbing  NEURO: alert & oriented x 3 with fluent speech, no focal motor/sensory deficits  LABORATORY DATA:  I have reviewed the data as listed   Lab Results  Component Value Date   WBC 12.6 (H) 05/05/2024   NEUTROABS 8.7 (H) 05/05/2024   HGB 9.1 (L) 05/05/2024   HCT 28.0 (L) 05/05/2024   MCV 94.0 05/05/2024   PLT 249 05/05/2024      Chemistry      Component Value Date/Time   NA 129 (L) 05/05/2024 0820   K 4.0 05/05/2024 0820   CL 94 (L) 05/05/2024 0820   CO2 22 05/05/2024 0820   BUN 12 05/05/2024 0820   CREATININE 0.92 05/05/2024 0820   CREATININE 1.25 (H) 11/25/2016 1053      Component Value Date/Time   CALCIUM  8.8 (L) 05/05/2024 0820   ALKPHOS 119 05/05/2024 0820   AST 27 05/05/2024 0820   ALT 13 05/05/2024 0820   BILITOT 0.4 05/05/2024 0820       RADIOGRAPHIC STUDIES: I have personally reviewed the radiological images as listed and agreed with the findings in the report.  CT CHEST ABDOMEN PELVIS W CONTRAST CLINICAL DATA:  Ovarian cancer, assess treatment response. * Tracking Code: BO *  EXAM: CT CHEST, ABDOMEN,  AND PELVIS WITH CONTRAST  TECHNIQUE: Multidetector CT imaging of the chest, abdomen and pelvis was performed following the standard protocol during bolus administration of intravenous contrast.  RADIATION DOSE REDUCTION: This exam was performed according to the departmental dose-optimization program which includes automated exposure control, adjustment of the mA and/or kV according to patient size and/or use of iterative reconstruction technique.  CONTRAST:  OMNIPAQUE  IOHEXOL  300 MG/ML  SOLN  COMPARISON:  Multiple priors including CT October 19, 2023 and Jan 23, 2024  FINDINGS: CT CHEST FINDINGS  Cardiovascular: Right chest Port-A-Cath with tip near the superior cavoatrial junction. Normal caliber thoracic aorta. Normal size heart. No significant pericardial effusion/thickening.  Mediastinum/Nodes: No suspicious thyroid  nodule. Stable prominent mediastinal lymph nodes. The esophagus  is grossly unremarkable.  Lungs/Pleura: Increased size of the bilateral pulmonary nodules/masses. For reference:  -pleural-based mass along the left major fissure in the lingula measures 3.3 cm on image 81/6 previously 19 mm  -pleural-based nodule in the anterior right middle lobe measures 13 mm on image 85/6 previously 12 mm.  -nodule in the superior segment of the left lower lobe measures 2.2 cm on image 42/6 previously 6 mm.  Musculoskeletal: No aggressive lytic or blastic lesion of bone.  CT ABDOMEN PELVIS FINDINGS  Hepatobiliary: There bilobar heterogeneously hypodense hepatic metastasis, for reference:  -segment VI/VII hepatic lesion measures 4.8 x 4.1 cm on image 46/2 not definitely seen on prior examination.  -segment III hepatic lesion measures 18 mm on image 48/2 previously 7 mm.  Gallbladder is unremarkable.  No biliary ductal dilation.  Pancreas: No pancreatic ductal dilation or evidence of acute inflammation.  Spleen: No splenomegaly.  Adrenals/Urinary Tract:  No suspicious adrenal nodule/mass. No hydronephrosis. Kidneys demonstrate symmetric enhancement. Symmetric wall thickening of a nondistended urinary bladder.  Stomach/Bowel: Stomach is unremarkable for degree of distension. No pathologic dilation of small or large bowel. No evidence of acute bowel inflammation.  Vascular/Lymphatic: Aortic atherosclerosis.  Left periaortic lymph node with internal calcifications measures 15 mm in short axis on image 64/2 previously 10 mm.  Reproductive: Increased fluid and nodularity in the endometrial canal. New left adnexal cyst measures 2.2 cm on image 83/2.  Other: No significant abdominopelvic free fluid.  Musculoskeletal: No aggressive lytic or blastic lesion of bone. Avascular necrosis of the right femoral head.  IMPRESSION: 1. Increased size of the bilateral pulmonary nodules/masses and new/increased size of the bilobar hepatic metastasis, consistent with worsening metastatic disease. 2. Increased fluid and nodularity in the endometrial canal. 3. New left adnexal cyst measures 2.2 cm, nonspecific. Attention on follow-up imaging suggested. 4. Increased size of the left periaortic lymph node with internal calcifications, consistent with metastatic disease. 5. Symmetric wall thickening of a nondistended urinary bladder. Correlate with urinalysis to exclude cystitis. 6. Aortic atherosclerosis.  Aortic Atherosclerosis (ICD10-I70.0).  Electronically Signed   By: Reyes Holder M.D.   On: 05/02/2024 06:25

## 2024-05-05 ENCOUNTER — Inpatient Hospital Stay (HOSPITAL_BASED_OUTPATIENT_CLINIC_OR_DEPARTMENT_OTHER): Admitting: Oncology

## 2024-05-05 ENCOUNTER — Inpatient Hospital Stay: Attending: Hematology

## 2024-05-05 ENCOUNTER — Encounter: Payer: Self-pay | Admitting: Student in an Organized Health Care Education/Training Program

## 2024-05-05 ENCOUNTER — Inpatient Hospital Stay

## 2024-05-05 DIAGNOSIS — Z95828 Presence of other vascular implants and grafts: Secondary | ICD-10-CM

## 2024-05-05 DIAGNOSIS — Z720 Tobacco use: Secondary | ICD-10-CM | POA: Diagnosis not present

## 2024-05-05 DIAGNOSIS — G620 Drug-induced polyneuropathy: Secondary | ICD-10-CM | POA: Diagnosis not present

## 2024-05-05 DIAGNOSIS — C787 Secondary malignant neoplasm of liver and intrahepatic bile duct: Secondary | ICD-10-CM | POA: Diagnosis not present

## 2024-05-05 DIAGNOSIS — Z79899 Other long term (current) drug therapy: Secondary | ICD-10-CM | POA: Insufficient documentation

## 2024-05-05 DIAGNOSIS — R519 Headache, unspecified: Secondary | ICD-10-CM

## 2024-05-05 DIAGNOSIS — C538 Malignant neoplasm of overlapping sites of cervix uteri: Secondary | ICD-10-CM | POA: Diagnosis not present

## 2024-05-05 DIAGNOSIS — J449 Chronic obstructive pulmonary disease, unspecified: Secondary | ICD-10-CM | POA: Diagnosis not present

## 2024-05-05 DIAGNOSIS — F17210 Nicotine dependence, cigarettes, uncomplicated: Secondary | ICD-10-CM | POA: Insufficient documentation

## 2024-05-05 DIAGNOSIS — Z5111 Encounter for antineoplastic chemotherapy: Secondary | ICD-10-CM | POA: Diagnosis not present

## 2024-05-05 DIAGNOSIS — C7802 Secondary malignant neoplasm of left lung: Secondary | ICD-10-CM | POA: Diagnosis not present

## 2024-05-05 DIAGNOSIS — Z5112 Encounter for antineoplastic immunotherapy: Secondary | ICD-10-CM | POA: Diagnosis not present

## 2024-05-05 DIAGNOSIS — C7801 Secondary malignant neoplasm of right lung: Secondary | ICD-10-CM | POA: Insufficient documentation

## 2024-05-05 DIAGNOSIS — Z79620 Long term (current) use of immunosuppressive biologic: Secondary | ICD-10-CM | POA: Diagnosis not present

## 2024-05-05 DIAGNOSIS — T451X5A Adverse effect of antineoplastic and immunosuppressive drugs, initial encounter: Secondary | ICD-10-CM | POA: Diagnosis not present

## 2024-05-05 DIAGNOSIS — C786 Secondary malignant neoplasm of retroperitoneum and peritoneum: Secondary | ICD-10-CM | POA: Diagnosis not present

## 2024-05-05 DIAGNOSIS — Z7189 Other specified counseling: Secondary | ICD-10-CM

## 2024-05-05 LAB — COMPREHENSIVE METABOLIC PANEL WITH GFR
ALT: 13 U/L (ref 0–44)
AST: 27 U/L (ref 15–41)
Albumin: 3.1 g/dL — ABNORMAL LOW (ref 3.5–5.0)
Alkaline Phosphatase: 119 U/L (ref 38–126)
Anion gap: 13 (ref 5–15)
BUN: 12 mg/dL (ref 6–20)
CO2: 22 mmol/L (ref 22–32)
Calcium: 8.8 mg/dL — ABNORMAL LOW (ref 8.9–10.3)
Chloride: 94 mmol/L — ABNORMAL LOW (ref 98–111)
Creatinine, Ser: 0.92 mg/dL (ref 0.44–1.00)
GFR, Estimated: >60 mL/min (ref >60)
Glucose, Bld: 93 mg/dL (ref 70–99)
Potassium: 4 mmol/L (ref 3.5–5.1)
Sodium: 129 mmol/L — ABNORMAL LOW (ref 135–145)
Total Bilirubin: 0.4 mg/dL (ref 0.0–1.2)
Total Protein: 7.3 g/dL (ref 6.5–8.1)

## 2024-05-05 LAB — CBC WITH DIFFERENTIAL/PLATELET
Abs Immature Granulocytes: 0.1 K/uL — ABNORMAL HIGH (ref 0.00–0.07)
Basophils Absolute: 0 K/uL (ref 0.0–0.1)
Basophils Relative: 0 %
Eosinophils Absolute: 0.1 K/uL (ref 0.0–0.5)
Eosinophils Relative: 1 %
HCT: 28 % — ABNORMAL LOW (ref 36.0–46.0)
Hemoglobin: 9.1 g/dL — ABNORMAL LOW (ref 12.0–15.0)
Immature Granulocytes: 1 %
Lymphocytes Relative: 15 %
Lymphs Abs: 1.9 K/uL (ref 0.7–4.0)
MCH: 30.5 pg (ref 26.0–34.0)
MCHC: 32.5 g/dL (ref 30.0–36.0)
MCV: 94 fL (ref 80.0–100.0)
Monocytes Absolute: 1.9 K/uL — ABNORMAL HIGH (ref 0.1–1.0)
Monocytes Relative: 15 %
Neutro Abs: 8.7 K/uL — ABNORMAL HIGH (ref 1.7–7.7)
Neutrophils Relative %: 68 %
Platelets: 249 K/uL (ref 150–400)
RBC: 2.98 MIL/uL — ABNORMAL LOW (ref 3.87–5.11)
RDW: 17.8 % — ABNORMAL HIGH (ref 11.5–15.5)
WBC: 12.6 K/uL — ABNORMAL HIGH (ref 4.0–10.5)
nRBC: 0 % (ref 0.0–0.2)

## 2024-05-05 LAB — MAGNESIUM: Magnesium: 1.5 mg/dL — ABNORMAL LOW (ref 1.7–2.4)

## 2024-05-05 LAB — TSH: TSH: 4.169 u[IU]/mL (ref 0.350–4.500)

## 2024-05-05 MED ORDER — SODIUM CHLORIDE 0.9 % IV SOLN
200.0000 mg | Freq: Once | INTRAVENOUS | Status: AC
  Administered 2024-05-05: 200 mg via INTRAVENOUS
  Filled 2024-05-05: qty 8

## 2024-05-05 MED ORDER — OXYCODONE HCL 5 MG PO TABS
5.0000 mg | ORAL_TABLET | Freq: Once | ORAL | Status: AC
  Administered 2024-05-05: 5 mg via ORAL
  Filled 2024-05-05: qty 1

## 2024-05-05 MED ORDER — SODIUM CHLORIDE 0.9 % IV SOLN
INTRAVENOUS | Status: DC
Start: 2024-05-05 — End: 2024-05-05

## 2024-05-05 MED ORDER — IPRATROPIUM-ALBUTEROL 0.5-2.5 (3) MG/3ML IN SOLN
3.0000 mL | Freq: Once | RESPIRATORY_TRACT | Status: AC
  Administered 2024-05-05: 3 mL via RESPIRATORY_TRACT
  Filled 2024-05-05: qty 3

## 2024-05-05 MED ORDER — MAGNESIUM SULFATE 2 GM/50ML IV SOLN
2.0000 g | Freq: Once | INTRAVENOUS | Status: AC
  Administered 2024-05-05: 2 g via INTRAVENOUS
  Filled 2024-05-05: qty 50

## 2024-05-05 NOTE — Progress Notes (Signed)
 Patient presents today for Abraxane  HR 108 & Magnesium  1.5. Patient okay for Keytruda  today per Dr. Davonna. 2g of magnesium  ordered per standing orders. Patient tolerated therapy with no complaints voiced. Side effects with management reviewed with understanding verbalized. Port site clean and dry with no bruising or swelling noted at site. Good blood return noted before and after administration of therapy. Band aid applied. Patient left in satisfactory condition with VSS and no s/s of distress noted.

## 2024-05-05 NOTE — Progress Notes (Signed)
 Pharmacist Chemotherapy Monitoring - Initial Assessment    Anticipated start date: 05/05/24   The following has been reviewed per standard work regarding the patient's treatment regimen: The patient's diagnosis, treatment plan and drug doses, and organ/hematologic function Lab orders and baseline tests specific to treatment regimen  The treatment plan start date, drug sequencing, and pre-medications Prior authorization status  Patient's documented medication list, including drug-drug interaction screen and prescriptions for anti-emetics and supportive care specific to the treatment regimen The drug concentrations, fluid compatibility, administration routes, and timing of the medications to be used The patient's access for treatment and lifetime cumulative dose history, if applicable  The patient's medication allergies and previous infusion related reactions, if applicable   Changes made to treatment plan:  N/A  Follow up needed:  N/A  PA approved   Kristin Carson, [facility], 05/05/2024  10:16 AM

## 2024-05-05 NOTE — Assessment & Plan Note (Addendum)
 Discussed goals of care in detail considering patient has a stage IV cervical cancer and is on fourth line of treatment  - Patient at this time would like to live as long as possible and would want to be full code. - Patient would like to try any treatment options available at this time.

## 2024-05-05 NOTE — Progress Notes (Signed)
 Patient has been examined by Dr. Davonna. Vital signs and labs have been reviewed by MD - ANC, Creatinine, LFTs, hemoglobin, and platelets have been reviewed by M.D. - pt may proceed with treatment. Tx change to Keytruda  every 21 days. Primary RN and pharmacy notified.

## 2024-05-05 NOTE — Patient Instructions (Signed)
 [facility] at [facility] Discharge Instructions   You were seen and examined today by Dr. Davonna.  She reviewed the results of your lab work which are normal/stable.   She reviewed the results of your CT scan that shows the cancer is growing. We will need to change your treatment.   We will proceed with your treatment today. The treatment will be an infusion called Keytruda . It is given in the clinic every 3 weeks. It is an immunotherapy drug. It works with your body's own immune system to help fight the cancer.   Return as scheduled.    Thank you for choosing [facility] at [facility] to provide your oncology and hematology care.  To afford each patient quality time with our provider, please arrive at least 15 minutes before your scheduled appointment time.   If you have a lab appointment with the Cancer Center please come in thru the Main Entrance and check in at the main information desk.  You need to re-schedule your appointment should you arrive 10 or more minutes late.  We strive to give you quality time with our providers, and arriving late affects you and other patients whose appointments are after yours.  Also, if you no show three or more times for appointments you may be dismissed from the clinic at the providers discretion.     Again, thank you for choosing [facility].  Our hope is that these requests will decrease the amount of time that you wait before being seen by our physicians.       _____________________________________________________________  Should you have questions after your visit to [facility], please contact our office at [phone] and follow the prompts.  Our office hours are 8:00 a.m. and 4:30 p.m. Monday - Friday.  Please note that voicemails left after 4:00 p.m. may not be returned until the following business day.  We are closed weekends and major holidays.  You do have access to a  nurse 24-7, just call the main number to the clinic [phone] and do not press any options, hold on the line and a nurse will answer the phone.    For prescription refill requests, have your pharmacy contact our office and allow 72 hours.    Due to Covid, you will need to wear a mask upon entering the hospital. If you do not have a mask, a mask will be given to you at the Main Entrance upon arrival. For doctor visits, patients may have 1 support person age 34 or older with them. For treatment visits, patients can not have anyone with them due to social distancing guidelines and our immunocompromised population.

## 2024-05-05 NOTE — Assessment & Plan Note (Addendum)
 Met static cervical carcinoma-static to lungs, liver and peritoneum First-line: Cisplatin /paclitaxel /bevacizumab /pembrolizumab  (added second cycle). Cisplatin  changed to carboplatin  for the last 3 cycles Second line: Tivdek Third line: Abraxane  Caris NGS: PD-L1: Positive, CPS: 2, no other actionable mutations PD-L1 CPS score of 8 on pathology  - We discussed and reviewed the latest CT scan in detail.  Very evident with progression of disease in the lungs and liver - Will obtain a biopsy of the lung to ensure that this is still cervical carcinoma and to rule out other primary lung carcinoma as patient is a smoker - Discussed in detail with her further options would be a single agent chemotherapies that do not have a lot of data.  Also can consider a retrial of single agent pembrolizumab  as she had PD-L1 positive disease with CPS of 8 and had prolonged control of disease with chemo and immunotherapy at the beginning.   - Patient is okay to do a trial of immunotherapy until we have the results back.  Risks versus benefits discussed in detail again.  Side effects discussed in detail including rash, thyroid  abnormalities, diarrhea, pneumonitis and colitis - Will consider another line of chemotherapy  with gemcitabine or consider single agent Cemiplimab if patient has worsening symptoms at next visit - Will obtain Caris assure to evaluate for any new actionable mutations  Return to clinic in 3 weeks for next cycle and to reevaluate other options.

## 2024-05-05 NOTE — Progress Notes (Signed)
 Ok to proceed with elevated HR 108  Dr Ivery Molt, PharmD

## 2024-05-05 NOTE — Progress Notes (Signed)
 DISCONTINUE OFF PATHWAY REGIMEN - Other   [id number]:Nab-Paclitaxel  100 mg/m2 IV 1,8,15 q21 Days:   A cycle is every 21 days:     Nab-paclitaxel  (protein bound)   **Always confirm dose/schedule in your pharmacy ordering system**  PRIOR TREATMENT: Nab-Paclitaxel  100 mg/m2 IV 1,8,15 q21 Days  START OFF PATHWAY REGIMEN - Other   [id number]:Pembrolizumab  200 mg IV D1 q21 Days:   A cycle is every 21 days:     Pembrolizumab    **Always confirm dose/schedule in your pharmacy ordering system**  Patient Characteristics: Intent of Therapy: Non-Curative / Palliative Intent, Discussed with Patient

## 2024-05-05 NOTE — Assessment & Plan Note (Addendum)
 Patient is a current smoker and is very motivated to quit  - Encouraged smoking cessation efforts

## 2024-05-05 NOTE — Patient Instructions (Signed)
 [facility] - A DEPT OF [facility]  Discharge Instructions: Thank you for choosing [facility] to provide your oncology and hematology care.  If you have a lab appointment with the Cancer Center - please note that after April 8th, 2024, all labs will be drawn in the cancer center.  You do not have to check in or register with the main entrance as you have in the past but will complete your check-in in the cancer center.  Wear comfortable clothing and clothing appropriate for easy access to any Portacath or PICC line.   We strive to give you quality time with your provider. You may need to reschedule your appointment if you arrive late (15 or more minutes).  Arriving late affects you and other patients whose appointments are after yours.  Also, if you miss three or more appointments without notifying the office, you may be dismissed from the clinic at the provider's discretion.      For prescription refill requests, have your pharmacy contact our office and allow 72 hours for refills to be completed.    Today you received the following chemotherapy and/or immunotherapy agents Keytruda  and 2g of magnesium , return as scheduled.   To help prevent nausea and vomiting after your treatment, we encourage you to take your nausea medication as directed.  BELOW ARE SYMPTOMS THAT SHOULD BE REPORTED IMMEDIATELY: *FEVER GREATER THAN 100.4 F (38 C) OR HIGHER *CHILLS OR SWEATING *NAUSEA AND VOMITING THAT IS NOT CONTROLLED WITH YOUR NAUSEA MEDICATION *UNUSUAL SHORTNESS OF BREATH *UNUSUAL BRUISING OR BLEEDING *URINARY PROBLEMS (pain or burning when urinating, or frequent urination) *BOWEL PROBLEMS (unusual diarrhea, constipation, pain near the anus) TENDERNESS IN MOUTH AND THROAT WITH OR WITHOUT PRESENCE OF ULCERS (sore throat, sores in mouth, or a toothache) UNUSUAL RASH, SWELLING OR PAIN  UNUSUAL VAGINAL DISCHARGE OR ITCHING   Items with * indicate a  potential emergency and should be followed up as soon as possible or go to the Emergency Department if any problems should occur.  Please show the CHEMOTHERAPY ALERT CARD or IMMUNOTHERAPY ALERT CARD at check-in to the Emergency Department and triage nurse.  Should you have questions after your visit or need to cancel or reschedule your appointment, please contact [facility] [place] - A DEPT OF [facility] [phone]  and follow the prompts.  Office hours are 8:00 a.m. to 4:30 p.m. Monday - Friday. Please note that voicemails left after 4:00 p.m. may not be returned until the following business day.  We are closed weekends and major holidays. You have access to a nurse at all times for urgent questions. Please call the main number to the clinic [phone] and follow the prompts.  For any non-urgent questions, you may also contact your provider using MyChart. We now offer e-Visits for anyone 5 and older to request care online for non-urgent symptoms. For details visit [email or web address].   Also download the MyChart app! Go to the app store, search MyChart, open the app, select [place], and log in with your MyChart username and password.

## 2024-05-05 NOTE — Assessment & Plan Note (Addendum)
 Patient reports stable pain in her hands and feet that is controlled with Lyrica   - Continue Lyrica  as prescribed

## 2024-05-06 ENCOUNTER — Other Ambulatory Visit: Payer: Self-pay

## 2024-05-06 ENCOUNTER — Telehealth: Payer: Self-pay

## 2024-05-06 DIAGNOSIS — J449 Chronic obstructive pulmonary disease, unspecified: Secondary | ICD-10-CM | POA: Diagnosis not present

## 2024-05-06 NOTE — Telephone Encounter (Signed)
 Patient called for 24-hour follow-up no response from patient.

## 2024-05-07 DIAGNOSIS — J449 Chronic obstructive pulmonary disease, unspecified: Secondary | ICD-10-CM | POA: Diagnosis not present

## 2024-05-09 DIAGNOSIS — J449 Chronic obstructive pulmonary disease, unspecified: Secondary | ICD-10-CM | POA: Diagnosis not present

## 2024-05-10 DIAGNOSIS — J449 Chronic obstructive pulmonary disease, unspecified: Secondary | ICD-10-CM | POA: Diagnosis not present

## 2024-05-11 DIAGNOSIS — J449 Chronic obstructive pulmonary disease, unspecified: Secondary | ICD-10-CM | POA: Diagnosis not present

## 2024-05-12 ENCOUNTER — Encounter: Payer: Self-pay | Admitting: Hematology

## 2024-05-12 ENCOUNTER — Inpatient Hospital Stay

## 2024-05-12 DIAGNOSIS — J449 Chronic obstructive pulmonary disease, unspecified: Secondary | ICD-10-CM | POA: Diagnosis not present

## 2024-05-12 DIAGNOSIS — Z7689 Persons encountering health services in other specified circumstances: Secondary | ICD-10-CM | POA: Diagnosis not present

## 2024-05-13 DIAGNOSIS — J449 Chronic obstructive pulmonary disease, unspecified: Secondary | ICD-10-CM | POA: Diagnosis not present

## 2024-05-13 DIAGNOSIS — Z419 Encounter for procedure for purposes other than remedying health state, unspecified: Secondary | ICD-10-CM | POA: Diagnosis not present

## 2024-05-14 DIAGNOSIS — J449 Chronic obstructive pulmonary disease, unspecified: Secondary | ICD-10-CM | POA: Diagnosis not present

## 2024-05-16 DIAGNOSIS — J449 Chronic obstructive pulmonary disease, unspecified: Secondary | ICD-10-CM | POA: Diagnosis not present

## 2024-05-17 DIAGNOSIS — J449 Chronic obstructive pulmonary disease, unspecified: Secondary | ICD-10-CM | POA: Diagnosis not present

## 2024-05-18 DIAGNOSIS — J449 Chronic obstructive pulmonary disease, unspecified: Secondary | ICD-10-CM | POA: Diagnosis not present

## 2024-05-19 ENCOUNTER — Inpatient Hospital Stay

## 2024-05-19 DIAGNOSIS — J449 Chronic obstructive pulmonary disease, unspecified: Secondary | ICD-10-CM | POA: Diagnosis not present

## 2024-05-20 DIAGNOSIS — J449 Chronic obstructive pulmonary disease, unspecified: Secondary | ICD-10-CM | POA: Diagnosis not present

## 2024-05-21 DIAGNOSIS — J449 Chronic obstructive pulmonary disease, unspecified: Secondary | ICD-10-CM | POA: Diagnosis not present

## 2024-05-23 DIAGNOSIS — J449 Chronic obstructive pulmonary disease, unspecified: Secondary | ICD-10-CM | POA: Diagnosis not present

## 2024-05-24 DIAGNOSIS — J449 Chronic obstructive pulmonary disease, unspecified: Secondary | ICD-10-CM | POA: Diagnosis not present

## 2024-05-25 ENCOUNTER — Encounter: Payer: Self-pay | Admitting: Student in an Organized Health Care Education/Training Program

## 2024-05-25 ENCOUNTER — Ambulatory Visit (INDEPENDENT_AMBULATORY_CARE_PROVIDER_SITE_OTHER): Admitting: Student in an Organized Health Care Education/Training Program

## 2024-05-25 ENCOUNTER — Telehealth: Payer: Self-pay

## 2024-05-25 ENCOUNTER — Other Ambulatory Visit: Payer: Self-pay

## 2024-05-25 VITALS — BP 110/64 | HR 89 | Temp 97.5°F | Ht 65.0 in | Wt 162.8 lb

## 2024-05-25 DIAGNOSIS — R918 Other nonspecific abnormal finding of lung field: Secondary | ICD-10-CM

## 2024-05-25 DIAGNOSIS — J42 Unspecified chronic bronchitis: Secondary | ICD-10-CM

## 2024-05-25 DIAGNOSIS — F17210 Nicotine dependence, cigarettes, uncomplicated: Secondary | ICD-10-CM | POA: Diagnosis not present

## 2024-05-25 DIAGNOSIS — J449 Chronic obstructive pulmonary disease, unspecified: Secondary | ICD-10-CM | POA: Diagnosis not present

## 2024-05-25 MED ORDER — BUDESONIDE-FORMOTEROL FUMARATE 160-4.5 MCG/ACT IN AERO
2.0000 | INHALATION_SPRAY | Freq: Two times a day (BID) | RESPIRATORY_TRACT | 11 refills | Status: DC
Start: 2024-05-25 — End: 2024-07-16

## 2024-05-25 MED ORDER — SPIRIVA RESPIMAT 2.5 MCG/ACT IN AERS: 2.0000 | INHALATION_SPRAY | Freq: Every day | RESPIRATORY_TRACT | 12 refills | Status: DC

## 2024-05-25 MED ORDER — ALBUTEROL SULFATE HFA 108 (90 BASE) MCG/ACT IN AERS: 2.0000 | INHALATION_SPRAY | Freq: Four times a day (QID) | RESPIRATORY_TRACT | 2 refills | Status: DC | PRN

## 2024-05-25 NOTE — Progress Notes (Unsigned)
 Synopsis: Referred in *** by Davonna Siad, MD  Assessment & Plan:   1. Chronic bronchitis, unspecified chronic bronchitis type (HCC) (Primary)  Patient with a history of smoking and has some wheezing on exam today.  She is at increased risk for bronchitis/COPD and we will start her on triple acting inhaler therapy.  She was previously on Symbicort  as well as albuterol  which she reported helped with symptoms.  Today, I will restart her Symbicort  and add LAMA therapy with Tio Tropium.  - budesonide -formoterol  (SYMBICORT ) 160-4.5 MCG/ACT inhaler; Inhale 2 puffs into the lungs in the morning and at bedtime.  Dispense: 1 each; Refill: 11 - albuterol  (VENTOLIN  HFA) 108 (90 Base) MCG/ACT inhaler; Inhale 2 puffs into the lungs every 6 (six) hours as needed for wheezing or shortness of breath.  Dispense: 8 g; Refill: 2 - Tiotropium Bromide Monohydrate  (SPIRIVA  RESPIMAT) 2.5 MCG/ACT AERS; Inhale 2 puffs into the lungs daily.  Dispense: 60 each; Refill: 12  2. Lung mass  The patient is here to discuss their imaging abnormalities which include findings of multiple pulmonary nodules noted on CT scan.  I have personally reviewed her multiple CT scans, dating back to over a year ago.  Her scan from 1 year ago only showed very tiny pulmonary nodules that have shown interval growth on repeat CT scan 6 months ago and then 1 month ago.  This is highly concerning for malignancy.  That said, these nodules do have smooth borders and appear peripheral making them more likely to represent hematogenous spread of malignancy than primary lung malignancy.  That said, she does have a history of smoking which puts her at increased risk of the development of primary lung cancer.  Given this could change her management, she was referred to us  for consideration of biopsy.  We discussed the importance of diagnosis and staging in lung malignancies, and the approach to obtaining a tissue diagnosis which would include robotic  assisted navigational bronchoscopy with endobronchial ultrasound guided sampling.  We also discussed the risks associated with the procedure which include a 2% risk of pneumothorax, infection, bleeding, and nondiagnostic procedure in detail.  I explained that patients typically are able to return home the same day of the procedure, but in rare cases admission to the hospital for observation and treatment is required.  After our discussion, the patient elected to proceed with the procedure  Recommendations:  - CT SUPER D CHEST WO CONTRAST; Future - Procedural/ Surgical Case Request: VIDEO BRONCHOSCOPY WITH ENDOBRONCHIAL NAVIGATION; Future    I spent *** minutes caring for this patient today, including {EM billing:28027}  Belva November, MD [facility] 05/25/2024 3:53 PM    End of visit medications:  Meds ordered this encounter  Medications   budesonide -formoterol  (SYMBICORT ) 160-4.5 MCG/ACT inhaler    Sig: Inhale 2 puffs into the lungs in the morning and at bedtime.    Dispense:  1 each    Refill:  11   albuterol  (VENTOLIN  HFA) 108 (90 Base) MCG/ACT inhaler    Sig: Inhale 2 puffs into the lungs every 6 (six) hours as needed for wheezing or shortness of breath.    Dispense:  8 g    Refill:  2   Tiotropium Bromide Monohydrate  (SPIRIVA  RESPIMAT) 2.5 MCG/ACT AERS    Sig: Inhale 2 puffs into the lungs daily.    Dispense:  60 each    Refill:  12     Current Outpatient Medications:    albuterol  (VENTOLIN  HFA) 108 (  90 Base) MCG/ACT inhaler, Inhale 2 puffs into the lungs every 6 (six) hours as needed for wheezing or shortness of breath., Disp: 8 g, Rfl: 2   amLODipine  (NORVASC ) 10 MG tablet, Take 1 tablet (10 mg total) by mouth daily., Disp: 90 tablet, Rfl: 3   oxyCODONE  (OXY IR/ROXICODONE ) 5 MG immediate release tablet, Take 1 tablet (5 mg total) by mouth every 6 (six) hours as needed for severe pain (pain score 7-10)., Disp: 120 tablet, Rfl: 0   pregabalin  (LYRICA )  200 MG capsule, Take 1 capsule (200 mg total) by mouth 3 (three) times daily., Disp: 180 capsule, Rfl: 0   Tiotropium Bromide Monohydrate  (SPIRIVA  RESPIMAT) 2.5 MCG/ACT AERS, Inhale 2 puffs into the lungs daily., Disp: 60 each, Rfl: 12   budesonide -formoterol  (SYMBICORT ) 160-4.5 MCG/ACT inhaler, Inhale 2 puffs into the lungs in the morning and at bedtime., Disp: 1 each, Rfl: 11   lidocaine -prilocaine  (EMLA ) cream, Apply a quarter-sized amount to port a cath site and cover with plastic wrap 1 hour prior to infusion appointments (Patient not taking: Reported on 05/25/2024), Disp: 30 g, Rfl: 3   magnesium  oxide (MAG-OX) 400 (240 Mg) MG tablet, Take 1 tablet (400 mg total) by mouth 3 (three) times daily. (Patient not taking: Reported on 05/25/2024), Disp: 90 tablet, Rfl: 3   megestrol  (MEGACE ) 400 MG/10ML suspension, Take 10 mLs (400 mg total) by mouth 2 (two) times daily. (Patient not taking: Reported on 05/25/2024), Disp: 480 mL, Rfl: 3   prednisoLONE  acetate (PRED FORTE ) 1 % ophthalmic suspension, Place 1 drop into both eyes 2 (two) times daily. (Patient not taking: Reported on 05/25/2024), Disp: , Rfl:    prochlorperazine  (COMPAZINE ) 10 MG tablet, Take 1 tablet (10 mg total) by mouth every 6 (six) hours as needed. (Patient not taking: Reported on 05/25/2024), Disp: 60 tablet, Rfl: 2   Subjective:   PATIENT ID: Kristin Carson GENDER: female DOB: 1970/09/16, MRN: [id number]  Chief Complaint  Patient presents with   Lung Mass    SOB. Wheezing. Cough with yellow sputum.    HPI  Patient is a pleasant 53 year old female with a history of cervical cancer with mets to lung and liver presenting for the evaluation of pulmonary nodules.  Patient follows closely with her oncologist Dr. Davonna. She was noted to have multiple pulmonary nodules on surveillance imaging over the past year which were concerning for metastatic malignancy.  Patient was last seen by oncology on 9//2025 and started on  pembrolizumab .  Patient reports symptoms of shortness of breath with a cough that is productive of some sputum.  She has some wheezing.  She reports being started on an inhaler in the past (dispense report shows prescription for Symbicort  in February as well as albuterol ).  She is not currently on any inhalers.  Patient reports smoking half a pack a day and has done so since age 89.  She also smokes MJ (once a day).  She reports recreational alcohol  use.  Ancillary information including prior medications, full medical/surgical/family/social histories, and PFTs (when available) are listed below and have been reviewed.   {PULM QUESTIONNAIRES (Optional):33196}  ROS   Objective:   Vitals:   05/25/24 1515  BP: 110/64  Pulse: 89  Temp: (!) 97.5 F (36.4 C)  SpO2: 99%  Weight: 162 lb 12.8 oz (73.8 kg)  Height: 5' 5 (1.651 m)   99% on *** LPM *** RA BMI Readings from Last 3 Encounters:  05/25/24 27.09 kg/m  05/05/24 27.99 kg/m  04/14/24 27.29  kg/m   Wt Readings from Last 3 Encounters:  05/25/24 162 lb 12.8 oz (73.8 kg)  05/05/24 168 lb 3.2 oz (76.3 kg)  04/14/24 164 lb (74.4 kg)    Physical Exam    Ancillary Information    Past Medical History:  Diagnosis Date   Anemia    Asthma    Bronchitis    Cancer (HCC)    Cervical   CHF (congestive heart failure) (HCC)    COPD (chronic obstructive pulmonary disease) (HCC)    Depression    GERD (gastroesophageal reflux disease)    Headache    Hypertension    Port-A-Cath in place 09/25/2022     Family History  Problem Relation Age of Onset   Dermatomyositis Mother    Hypertension Mother    Cancer Mother        lung   Cancer Father        lung   Heart disease Father    Lung cancer Paternal Uncle    Lung cancer Maternal Uncle    Colon cancer Neg Hx    Breast cancer Neg Hx    Ovarian cancer Neg Hx    Endometrial cancer Neg Hx    Pancreatic cancer Neg Hx    Prostate cancer Neg Hx      Past Surgical History:   Procedure Laterality Date   BREAST SURGERY     CESAREAN SECTION     IR IMAGING GUIDED PORT INSERTION  09/30/2022   LEG SURGERY      Social History   Socioeconomic History   Marital status: Married    Spouse name: Not on file   Number of children: Not on file   Years of education: Not on file   Highest education level: Not on file  Occupational History   Not on file  Tobacco Use   Smoking status: Every Day    Current packs/day: 0.50    Types: Cigarettes    Start date: 05/25/2024   Smokeless tobacco: Never   Tobacco comments:    Smokes 0.5 PPD - khj 05/25/2024        Started smoking at 53 yrs old    Smoked 1PPD at her heaviest  Vaping Use   Vaping status: Never Used  Substance and Sexual Activity   Alcohol  use: Yes    Comment: occ   Drug use: Yes    Types: Marijuana   Sexual activity: Yes    Birth control/protection: None  Other Topics Concern   Not on file  Social History Narrative   Not on file   Social Drivers of Health   Financial Resource Strain: Low Risk  (09/09/2022)   Overall Financial Resource Strain (CARDIA)    Difficulty of Paying Living Expenses: Not hard at all  Food Insecurity: No Food Insecurity (01/24/2024)   Hunger Vital Sign    Worried About Running Out of Food in the Last Year: Never true    Ran Out of Food in the Last Year: Never true  Transportation Needs: No Transportation Needs (01/24/2024)   PRAPARE - Administrator, Civil Service (Medical): No    Lack of Transportation (Non-Medical): No  Physical Activity: Insufficiently Active (09/09/2022)   Exercise Vital Sign    Days of Exercise per Week: 2 days    Minutes of Exercise per Session: 20 min  Stress: No Stress Concern Present (09/09/2022)   [employer] of Occupational Health - Occupational Stress Questionnaire    Feeling of Stress : Only  a little  Social Connections: Socially Isolated (01/24/2024)   Social Connection and Isolation Panel    Frequency of Communication with  Friends and Family: More than three times a week    Frequency of Social Gatherings with Friends and Family: More than three times a week    Attends Religious Services: Never    Database administrator or Organizations: No    Attends Banker Meetings: Never    Marital Status: Separated  Intimate Partner Violence: Not At Risk (01/24/2024)   Humiliation, Afraid, Rape, and Kick questionnaire    Fear of Current or Ex-Partner: No    Emotionally Abused: No    Physically Abused: No    Sexually Abused: No     Allergies  Allergen Reactions   Carrot [Daucus Carota] Hives   Lisinopril -Hydrochlorothiazide      Oral swelling   Ibuprofen  Other (See Comments)    Oral swelling   Other Itching and Other (See Comments)    Hair dye, blisters, pus-filled, soreness   Tramadol  Hives   Erythromycin Hives     CBC    Component Value Date/Time   WBC 12.6 (H) 05/05/2024 0820   RBC 2.98 (L) 05/05/2024 0820   HGB 9.1 (L) 05/05/2024 0820   HCT 28.0 (L) 05/05/2024 0820   PLT 249 05/05/2024 0820   MCV 94.0 05/05/2024 0820   MCH 30.5 05/05/2024 0820   MCHC 32.5 05/05/2024 0820   RDW 17.8 (H) 05/05/2024 0820   LYMPHSABS 1.9 05/05/2024 0820   MONOABS 1.9 (H) 05/05/2024 0820   EOSABS 0.1 05/05/2024 0820   BASOSABS 0.0 05/05/2024 0820    Pulmonary Functions Testing Results:     No data to display          Outpatient Medications Prior to Visit  Medication Sig Dispense Refill   amLODipine  (NORVASC ) 10 MG tablet Take 1 tablet (10 mg total) by mouth daily. 90 tablet 3   oxyCODONE  (OXY IR/ROXICODONE ) 5 MG immediate release tablet Take 1 tablet (5 mg total) by mouth every 6 (six) hours as needed for severe pain (pain score 7-10). 120 tablet 0   pregabalin  (LYRICA ) 200 MG capsule Take 1 capsule (200 mg total) by mouth 3 (three) times daily. 180 capsule 0   lidocaine -prilocaine  (EMLA ) cream Apply a quarter-sized amount to port a cath site and cover with plastic wrap 1 hour prior to infusion  appointments (Patient not taking: Reported on 05/25/2024) 30 g 3   magnesium  oxide (MAG-OX) 400 (240 Mg) MG tablet Take 1 tablet (400 mg total) by mouth 3 (three) times daily. (Patient not taking: Reported on 05/25/2024) 90 tablet 3   megestrol  (MEGACE ) 400 MG/10ML suspension Take 10 mLs (400 mg total) by mouth 2 (two) times daily. (Patient not taking: Reported on 05/25/2024) 480 mL 3   prednisoLONE  acetate (PRED FORTE ) 1 % ophthalmic suspension Place 1 drop into both eyes 2 (two) times daily. (Patient not taking: Reported on 05/25/2024)     prochlorperazine  (COMPAZINE ) 10 MG tablet Take 1 tablet (10 mg total) by mouth every 6 (six) hours as needed. (Patient not taking: Reported on 05/25/2024) 60 tablet 2   budesonide -formoterol  (SYMBICORT ) 160-4.5 MCG/ACT inhaler Inhale 2 puffs into the lungs in the morning and at bedtime. (Patient not taking: Reported on 05/25/2024) 1 each 3   levalbuterol  (XOPENEX  HFA) 45 MCG/ACT inhaler Inhale 2 puffs into the lungs every 8 (eight) hours as needed for wheezing or shortness of breath. (Patient not taking: Reported on 05/25/2024) 1 each 2  No facility-administered medications prior to visit.

## 2024-05-25 NOTE — H&P (View-Only) (Signed)
 Synopsis: Referred in *** by Davonna Siad, MD  Assessment & Plan:   1. Chronic bronchitis, unspecified chronic bronchitis type (HCC) (Primary)  Patient with a history of smoking and has some wheezing on exam today.  She is at increased risk for bronchitis/COPD and we will start her on triple acting inhaler therapy.  She was previously on Symbicort  as well as albuterol  which she reported helped with symptoms.  Today, I will restart her Symbicort  and add LAMA therapy with Tio Tropium.  - budesonide -formoterol  (SYMBICORT ) 160-4.5 MCG/ACT inhaler; Inhale 2 puffs into the lungs in the morning and at bedtime.  Dispense: 1 each; Refill: 11 - albuterol  (VENTOLIN  HFA) 108 (90 Base) MCG/ACT inhaler; Inhale 2 puffs into the lungs every 6 (six) hours as needed for wheezing or shortness of breath.  Dispense: 8 g; Refill: 2 - Tiotropium Bromide Monohydrate  (SPIRIVA  RESPIMAT) 2.5 MCG/ACT AERS; Inhale 2 puffs into the lungs daily.  Dispense: 60 each; Refill: 12  2. Lung mass  The patient is here to discuss their imaging abnormalities which include findings of multiple pulmonary nodules noted on CT scan.  I have personally reviewed her multiple CT scans, dating back to over a year ago.  Her scan from 1 year ago only showed very tiny pulmonary nodules that have shown interval growth on repeat CT scan 6 months ago and then 1 month ago.  This is highly concerning for malignancy.  That said, these nodules do have smooth borders and appear peripheral making them more likely to represent hematogenous spread of malignancy than primary lung malignancy.  That said, she does have a history of smoking which puts her at increased risk of the development of primary lung cancer.  Given this could change her management, she was referred to us  for consideration of biopsy.  We discussed the importance of diagnosis and staging in lung malignancies, and the approach to obtaining a tissue diagnosis which would include robotic  assisted navigational bronchoscopy with endobronchial ultrasound guided sampling.  We also discussed the risks associated with the procedure which include a 2% risk of pneumothorax, infection, bleeding, and nondiagnostic procedure in detail.  I explained that patients typically are able to return home the same day of the procedure, but in rare cases admission to the hospital for observation and treatment is required.  After our discussion, the patient elected to proceed with the procedure  Recommendations:  - CT SUPER D CHEST WO CONTRAST; Future - Procedural/ Surgical Case Request: VIDEO BRONCHOSCOPY WITH ENDOBRONCHIAL NAVIGATION; Future    I spent *** minutes caring for this patient today, including {EM billing:28027}  Belva November, MD [facility] 05/25/2024 3:53 PM    End of visit medications:  Meds ordered this encounter  Medications   budesonide -formoterol  (SYMBICORT ) 160-4.5 MCG/ACT inhaler    Sig: Inhale 2 puffs into the lungs in the morning and at bedtime.    Dispense:  1 each    Refill:  11   albuterol  (VENTOLIN  HFA) 108 (90 Base) MCG/ACT inhaler    Sig: Inhale 2 puffs into the lungs every 6 (six) hours as needed for wheezing or shortness of breath.    Dispense:  8 g    Refill:  2   Tiotropium Bromide Monohydrate  (SPIRIVA  RESPIMAT) 2.5 MCG/ACT AERS    Sig: Inhale 2 puffs into the lungs daily.    Dispense:  60 each    Refill:  12     Current Outpatient Medications:    albuterol  (VENTOLIN  HFA) 108 (  90 Base) MCG/ACT inhaler, Inhale 2 puffs into the lungs every 6 (six) hours as needed for wheezing or shortness of breath., Disp: 8 g, Rfl: 2   amLODipine  (NORVASC ) 10 MG tablet, Take 1 tablet (10 mg total) by mouth daily., Disp: 90 tablet, Rfl: 3   oxyCODONE  (OXY IR/ROXICODONE ) 5 MG immediate release tablet, Take 1 tablet (5 mg total) by mouth every 6 (six) hours as needed for severe pain (pain score 7-10)., Disp: 120 tablet, Rfl: 0   pregabalin  (LYRICA )  200 MG capsule, Take 1 capsule (200 mg total) by mouth 3 (three) times daily., Disp: 180 capsule, Rfl: 0   Tiotropium Bromide Monohydrate  (SPIRIVA  RESPIMAT) 2.5 MCG/ACT AERS, Inhale 2 puffs into the lungs daily., Disp: 60 each, Rfl: 12   budesonide -formoterol  (SYMBICORT ) 160-4.5 MCG/ACT inhaler, Inhale 2 puffs into the lungs in the morning and at bedtime., Disp: 1 each, Rfl: 11   lidocaine -prilocaine  (EMLA ) cream, Apply a quarter-sized amount to port a cath site and cover with plastic wrap 1 hour prior to infusion appointments (Patient not taking: Reported on 05/25/2024), Disp: 30 g, Rfl: 3   magnesium  oxide (MAG-OX) 400 (240 Mg) MG tablet, Take 1 tablet (400 mg total) by mouth 3 (three) times daily. (Patient not taking: Reported on 05/25/2024), Disp: 90 tablet, Rfl: 3   megestrol  (MEGACE ) 400 MG/10ML suspension, Take 10 mLs (400 mg total) by mouth 2 (two) times daily. (Patient not taking: Reported on 05/25/2024), Disp: 480 mL, Rfl: 3   prednisoLONE  acetate (PRED FORTE ) 1 % ophthalmic suspension, Place 1 drop into both eyes 2 (two) times daily. (Patient not taking: Reported on 05/25/2024), Disp: , Rfl:    prochlorperazine  (COMPAZINE ) 10 MG tablet, Take 1 tablet (10 mg total) by mouth every 6 (six) hours as needed. (Patient not taking: Reported on 05/25/2024), Disp: 60 tablet, Rfl: 2   Subjective:   PATIENT ID: Kristin Carson GENDER: female DOB: 1970/09/16, MRN: [id number]  Chief Complaint  Patient presents with   Lung Mass    SOB. Wheezing. Cough with yellow sputum.    HPI  Patient is a pleasant 53 year old female with a history of cervical cancer with mets to lung and liver presenting for the evaluation of pulmonary nodules.  Patient follows closely with her oncologist Dr. Davonna. She was noted to have multiple pulmonary nodules on surveillance imaging over the past year which were concerning for metastatic malignancy.  Patient was last seen by oncology on 9//2025 and started on  pembrolizumab .  Patient reports symptoms of shortness of breath with a cough that is productive of some sputum.  She has some wheezing.  She reports being started on an inhaler in the past (dispense report shows prescription for Symbicort  in February as well as albuterol ).  She is not currently on any inhalers.  Patient reports smoking half a pack a day and has done so since age 89.  She also smokes MJ (once a day).  She reports recreational alcohol  use.  Ancillary information including prior medications, full medical/surgical/family/social histories, and PFTs (when available) are listed below and have been reviewed.   {PULM QUESTIONNAIRES (Optional):33196}  ROS   Objective:   Vitals:   05/25/24 1515  BP: 110/64  Pulse: 89  Temp: (!) 97.5 F (36.4 C)  SpO2: 99%  Weight: 162 lb 12.8 oz (73.8 kg)  Height: 5' 5 (1.651 m)   99% on *** LPM *** RA BMI Readings from Last 3 Encounters:  05/25/24 27.09 kg/m  05/05/24 27.99 kg/m  04/14/24 27.29  kg/m   Wt Readings from Last 3 Encounters:  05/25/24 162 lb 12.8 oz (73.8 kg)  05/05/24 168 lb 3.2 oz (76.3 kg)  04/14/24 164 lb (74.4 kg)    Physical Exam    Ancillary Information    Past Medical History:  Diagnosis Date   Anemia    Asthma    Bronchitis    Cancer (HCC)    Cervical   CHF (congestive heart failure) (HCC)    COPD (chronic obstructive pulmonary disease) (HCC)    Depression    GERD (gastroesophageal reflux disease)    Headache    Hypertension    Port-A-Cath in place 09/25/2022     Family History  Problem Relation Age of Onset   Dermatomyositis Mother    Hypertension Mother    Cancer Mother        lung   Cancer Father        lung   Heart disease Father    Lung cancer Paternal Uncle    Lung cancer Maternal Uncle    Colon cancer Neg Hx    Breast cancer Neg Hx    Ovarian cancer Neg Hx    Endometrial cancer Neg Hx    Pancreatic cancer Neg Hx    Prostate cancer Neg Hx      Past Surgical History:   Procedure Laterality Date   BREAST SURGERY     CESAREAN SECTION     IR IMAGING GUIDED PORT INSERTION  09/30/2022   LEG SURGERY      Social History   Socioeconomic History   Marital status: Married    Spouse name: Not on file   Number of children: Not on file   Years of education: Not on file   Highest education level: Not on file  Occupational History   Not on file  Tobacco Use   Smoking status: Every Day    Current packs/day: 0.50    Types: Cigarettes    Start date: 05/25/2024   Smokeless tobacco: Never   Tobacco comments:    Smokes 0.5 PPD - khj 05/25/2024        Started smoking at 53 yrs old    Smoked 1PPD at her heaviest  Vaping Use   Vaping status: Never Used  Substance and Sexual Activity   Alcohol  use: Yes    Comment: occ   Drug use: Yes    Types: Marijuana   Sexual activity: Yes    Birth control/protection: None  Other Topics Concern   Not on file  Social History Narrative   Not on file   Social Drivers of Health   Financial Resource Strain: Low Risk  (09/09/2022)   Overall Financial Resource Strain (CARDIA)    Difficulty of Paying Living Expenses: Not hard at all  Food Insecurity: No Food Insecurity (01/24/2024)   Hunger Vital Sign    Worried About Running Out of Food in the Last Year: Never true    Ran Out of Food in the Last Year: Never true  Transportation Needs: No Transportation Needs (01/24/2024)   PRAPARE - Administrator, Civil Service (Medical): No    Lack of Transportation (Non-Medical): No  Physical Activity: Insufficiently Active (09/09/2022)   Exercise Vital Sign    Days of Exercise per Week: 2 days    Minutes of Exercise per Session: 20 min  Stress: No Stress Concern Present (09/09/2022)   [employer] of Occupational Health - Occupational Stress Questionnaire    Feeling of Stress : Only  a little  Social Connections: Socially Isolated (01/24/2024)   Social Connection and Isolation Panel    Frequency of Communication with  Friends and Family: More than three times a week    Frequency of Social Gatherings with Friends and Family: More than three times a week    Attends Religious Services: Never    Database administrator or Organizations: No    Attends Banker Meetings: Never    Marital Status: Separated  Intimate Partner Violence: Not At Risk (01/24/2024)   Humiliation, Afraid, Rape, and Kick questionnaire    Fear of Current or Ex-Partner: No    Emotionally Abused: No    Physically Abused: No    Sexually Abused: No     Allergies  Allergen Reactions   Carrot [Daucus Carota] Hives   Lisinopril -Hydrochlorothiazide      Oral swelling   Ibuprofen  Other (See Comments)    Oral swelling   Other Itching and Other (See Comments)    Hair dye, blisters, pus-filled, soreness   Tramadol  Hives   Erythromycin Hives     CBC    Component Value Date/Time   WBC 12.6 (H) 05/05/2024 0820   RBC 2.98 (L) 05/05/2024 0820   HGB 9.1 (L) 05/05/2024 0820   HCT 28.0 (L) 05/05/2024 0820   PLT 249 05/05/2024 0820   MCV 94.0 05/05/2024 0820   MCH 30.5 05/05/2024 0820   MCHC 32.5 05/05/2024 0820   RDW 17.8 (H) 05/05/2024 0820   LYMPHSABS 1.9 05/05/2024 0820   MONOABS 1.9 (H) 05/05/2024 0820   EOSABS 0.1 05/05/2024 0820   BASOSABS 0.0 05/05/2024 0820    Pulmonary Functions Testing Results:     No data to display          Outpatient Medications Prior to Visit  Medication Sig Dispense Refill   amLODipine  (NORVASC ) 10 MG tablet Take 1 tablet (10 mg total) by mouth daily. 90 tablet 3   oxyCODONE  (OXY IR/ROXICODONE ) 5 MG immediate release tablet Take 1 tablet (5 mg total) by mouth every 6 (six) hours as needed for severe pain (pain score 7-10). 120 tablet 0   pregabalin  (LYRICA ) 200 MG capsule Take 1 capsule (200 mg total) by mouth 3 (three) times daily. 180 capsule 0   lidocaine -prilocaine  (EMLA ) cream Apply a quarter-sized amount to port a cath site and cover with plastic wrap 1 hour prior to infusion  appointments (Patient not taking: Reported on 05/25/2024) 30 g 3   magnesium  oxide (MAG-OX) 400 (240 Mg) MG tablet Take 1 tablet (400 mg total) by mouth 3 (three) times daily. (Patient not taking: Reported on 05/25/2024) 90 tablet 3   megestrol  (MEGACE ) 400 MG/10ML suspension Take 10 mLs (400 mg total) by mouth 2 (two) times daily. (Patient not taking: Reported on 05/25/2024) 480 mL 3   prednisoLONE  acetate (PRED FORTE ) 1 % ophthalmic suspension Place 1 drop into both eyes 2 (two) times daily. (Patient not taking: Reported on 05/25/2024)     prochlorperazine  (COMPAZINE ) 10 MG tablet Take 1 tablet (10 mg total) by mouth every 6 (six) hours as needed. (Patient not taking: Reported on 05/25/2024) 60 tablet 2   budesonide -formoterol  (SYMBICORT ) 160-4.5 MCG/ACT inhaler Inhale 2 puffs into the lungs in the morning and at bedtime. (Patient not taking: Reported on 05/25/2024) 1 each 3   levalbuterol  (XOPENEX  HFA) 45 MCG/ACT inhaler Inhale 2 puffs into the lungs every 8 (eight) hours as needed for wheezing or shortness of breath. (Patient not taking: Reported on 05/25/2024) 1 each 2  No facility-administered medications prior to visit.

## 2024-05-25 NOTE — Telephone Encounter (Signed)
 Robotic Bronch with EBUS 06/07/2024 at 8:00am Lung nodule [id number], [id number], [id number]  Kristin Carson please see bronch info.   Patient is aware of date and time.

## 2024-05-26 DIAGNOSIS — J449 Chronic obstructive pulmonary disease, unspecified: Secondary | ICD-10-CM | POA: Diagnosis not present

## 2024-05-26 NOTE — Telephone Encounter (Signed)
 For the codes 68372, [id number], [phone] Prior Auth Not Required Refer # [id number]

## 2024-05-26 NOTE — Telephone Encounter (Signed)
 Noted. Nothing further needed.

## 2024-05-27 ENCOUNTER — Other Ambulatory Visit: Payer: Self-pay

## 2024-05-27 DIAGNOSIS — G622 Polyneuropathy due to other toxic agents: Secondary | ICD-10-CM

## 2024-05-27 DIAGNOSIS — J449 Chronic obstructive pulmonary disease, unspecified: Secondary | ICD-10-CM | POA: Diagnosis not present

## 2024-05-27 MED ORDER — OXYCODONE HCL 5 MG PO TABS: 5.0000 mg | ORAL_TABLET | Freq: Four times a day (QID) | ORAL | 0 refills | Status: DC | PRN

## 2024-05-27 MED ORDER — PREGABALIN 200 MG PO CAPS: 200.0000 mg | ORAL_CAPSULE | Freq: Three times a day (TID) | ORAL | 0 refills | Status: DC

## 2024-05-28 DIAGNOSIS — J449 Chronic obstructive pulmonary disease, unspecified: Secondary | ICD-10-CM | POA: Diagnosis not present

## 2024-05-29 NOTE — Progress Notes (Signed)
 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 Cisplatin  changed to carboplatin  for the last 3 cycles Second line: Tivdek Third line: Abraxane  [facility]is NGS: PD-L1: Positive, CPS: 2, no other actionable mutations PD-L1 CPS score of 8 on pathology 04/25/2024: CT scan with progression of disease  -Patient was seen by Dr. Isadora and is scheduled for CT chest super D followed by EBUS with biopsy. Will follow up on this - Discussed in detail with her further options would be a single agent chemotherapies that do not have a lot of data.  Patient received 1 dose of pembrolizumab  that she progressed on previously - Discussed with the patient that the next line of treatment option would be single agent gemcitabine.  The patient is agreeable to the change in treatment. -Discussed side effects of gemcitabine in detail. It can cause common side effects such as nausea, vomiting, fatigue, fever, and low blood counts, leading to increased risk of infection, anemia, and bleeding. Serious side effects may include allergic reactions, capillary leak syndrome, liver or lung injury, and neurological symptoms like confusion or seizures. - Will proceed with gemcitabine 1000 mg/m every 2 weeks. - Will repeat CT scan after 3 months to assess for response  Return  to clinic in 4 weeks for next cycle  Peripheral neuropathy due to chemotherapy Patient reports stable pain in her hands and feet that is controlled with Lyrica   - Continue Lyrica  as prescribed Tobacco use Patient is a current smoker and is very motivated to quit  - Encouraged smoking cessation efforts   The patient understands the plans discussed today and is in agreement with them.  She knows to contact our office if she develops concerns prior to her next appointment.  40 minutes of total time was spent for this patient encounter, including preparation,review of records,  face-to-face counseling with the patient and coordination of care, physical exam, and documentation of the encounter.    [name],acting as a Neurosurgeon for Mickiel Dry, MD.,have documented all relevant documentation on the behalf of Mickiel Dry, MD,as directed by  Mickiel Dry, MD while in the presence of Mickiel Dry, MD.  I, Mickiel Dry MD, have reviewed the above documentation for accuracy and completeness, and I agree with the above.     Mickiel Dry, MD  [facility] [place] - A DEPT OF [facility] [address] [place] [address] Dept: [phone] Dept Fax: [phone]   Orders Placed This Encounter  Procedures   CBC with Differential    Standing Status:   Future    Expected Date:   06/13/2024    Expiration Date:   06/13/2025   Comprehensive metabolic panel    Standing Status:   Future    Expected Date:   06/13/2024    Expiration Date:   06/13/2025   CBC with Differential  Standing Status:   Future    Expected Date:   06/27/2024    Expiration Date:   06/27/2025   Comprehensive metabolic panel    Standing Status:   Future    Expected Date:   06/27/2024    Expiration Date:   06/27/2025   CBC with Differential    Standing Status:   Future    Expected Date:   07/11/2024    Expiration Date:   07/11/2025    Comprehensive metabolic panel    Standing Status:   Future    Expected Date:   07/11/2024    Expiration Date:   07/11/2025     ONCOLOGY HISTORY:   I have reviewed her chart and materials related to her cancer extensively and collaborated history with the patient. 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 Foci of gas in the cervix which may reflect fistulous connection versus sequela of recent instrumentation. Metastatic abdominopelvic lymph nodes with peritoneal carcinomatosis and a small volume abdominopelvic ascites. Pulmonary nodules in the bilateral lung bases are concerning for metastatic disease involvement. Segment IV hepatic hypodensity near the falciform ligament measuring 12 mm is nonspecific but suspicious for metastatic disease although this is a common location for focal fatty infiltration. -09/09/2022: Endometrial and cervical biopsy.  -Endometrium Pathology: Invasive moderate to poorly differentiated squamous cell carcinoma with abundant necrosis. Background extensive severe squamous dysplasia to carcinoma in situ. Fragments of mature adipose. Marked chronic endocervicitis with squamous metaplasia.  -Cervix Pathology: Invasive and in situ moderate to poorly differentiated squamous cell carcinoma.  -Cytology of pap smear: positive for high grade squamous intraepithelial lesion and HPV 18/45.  -09/22/2022: PD-L1 CPS: 8%. -10/02/2022-09/17/2023: Cisplatin , paclitaxel , bevacizumab  with pembrolizumab  added to regimen with cycle 2 on 10/23/2022  and paclitaxel  discontinued on 05/12/2023 secondary to neuropathy. Cisplatin  changed to carboplatin  (06/25/2023). Regimen discontinued due to progression -10/10/2022:Caris NGS: PD-L1 (22 C3): positive CPS 2, HER2 IHC negative. PIK3CA exon 10 pathogenic variant.  MSI-stable, pMMR.  TMB-low, 59mut/Mb.  FANCM and SDHA pathogenic variants.  BRAF, NTRK 1/2/3, RET: Negative -10/14/2022: Body Scan: No definite evidence for skeletal metastatic disease.  -10/19/2023: Germline mutation testing: Negative -10/19/2023: CT CAP: Increased size and number of bilateral pulmonary nodules, consistent with worsening metastatic disease. New subtle hepatic hypodensities, suspicious for metastatic disease. New fluid and irregular nodular enhancement in the endometrial canal, suspicious for recurrent disease. Enlarged left periaortic lymph node, suspicious for metastatic disease. -11/12/2023-12/24/2023: 3 cycles of Tisotumab, discontinued due to progression -01/23/2024: CT AP: Progression of endometrial carcinoma since 10/19/2023 with increased distension of the endometrial canal now measuring 6.1 cm. Worsening hepatic and pulmonary metastases. Similar left para-aortic lymphadenopathy. -02/08/2024-current: Abraxane  100 mg/m 3 weeks on/1 week off -04/25/2024: CT CAP: Increased size of the bilateral pulmonary nodules/masses and new/increased size of the bilobar hepatic metastasis, consistent with worsening metastatic disease. Increased fluid and nodularity in the endometrial canal. New left adnexal cyst measures 2.2 cm, nonspecific. Increased size of the left periaortic lymph node with internal calcifications, consistent with metastatic disease. -05/14/2024: Caris assure: ARID1A, FANCM, KMT2D,PIK3CA,RB1,SDHA,WRN: Present,DNMT3A: Likely pathogenic variant  -BRCA1: VUS, NTRK2: VUS, EP300,  SETD2,AXIN1: VUS  -TMB: 8, MSI-not detected - 05/05/2024: Received 1 dose of pembrolizumab  200 mg.  Discontinued as patient has previously  received this in progress tolerated. - 05/30/2024: Started on gemcitabine 1000 mg/m every 2-weeks  Current Treatment: Gemcitabine 1000 mg/m every 2 weeks INTERVAL HISTORY:  Discussed the use of AI scribe software for clinical note transcription with the patient, who gave verbal consent to proceed.  History of Present Illness  Kristin Carson is here today for follow up. Patient is accompanied by her husband today.  She has no complaints today.  She has reduced her smoking to half a pack per day, an improvement from her previous habits.  She has a good appetite but experiences wheezing. She uses inhalers, which were prescribed by Dr.Dgayli, and has been given three inhalers recently.   I have reviewed the past medical history, past surgical history, social history and family history with the patient and they are unchanged from previous note.  ALLERGIES:  is allergic to carrot [daucus carota], lisinopril -hydrochlorothiazide , ibuprofen , other, tramadol , and erythromycin.  MEDICATIONS:  Current Outpatient Medications  Medication Sig Dispense Refill   albuterol  (VENTOLIN  HFA) 108 (90 Base) MCG/ACT inhaler Inhale 2 puffs into the lungs every 6 (six) hours as needed for wheezing or shortness of breath. 8 g 2   budesonide -formoterol  (SYMBICORT ) 160-4.5 MCG/ACT inhaler Inhale 2 puffs into the lungs in the morning and at bedtime. 1 each 11   oxyCODONE  (OXY IR/ROXICODONE ) 5 MG immediate release tablet Take 1 tablet (5 mg total) by mouth every 6 (six) hours as needed for severe pain (pain score 7-10). 120 tablet 0   pregabalin  (LYRICA ) 200 MG capsule Take 1 capsule (200 mg total) by mouth 3 (three) times daily. 180 capsule 0   Tiotropium Bromide Monohydrate  (SPIRIVA  RESPIMAT) 2.5 MCG/ACT AERS Inhale 2 puffs into the lungs daily. 60 each 12   amLODipine  (NORVASC ) 10 MG tablet Take 1 tablet (10 mg total) by mouth daily. 90 tablet 3   lidocaine -prilocaine  (EMLA ) cream Apply a quarter-sized amount to  port a cath site and cover with plastic wrap 1 hour prior to infusion appointments 30 g 3   magnesium  oxide (MAG-OX) 400 (240 Mg) MG tablet Take 1 tablet (400 mg total) by mouth 3 (three) times daily. 90 tablet 3   megestrol  (MEGACE ) 400 MG/10ML suspension Take 10 mLs (400 mg total) by mouth 2 (two) times daily. 480 mL 3   prednisoLONE  acetate (PRED FORTE ) 1 % ophthalmic suspension Place 1 drop into both eyes 2 (two) times daily.     prochlorperazine  (COMPAZINE ) 10 MG tablet Take 1 tablet (10 mg total) by mouth every 6 (six) hours as needed. 60 tablet 2   No current facility-administered medications for this visit.   Facility-Administered Medications Ordered in Other Visits  Medication Dose Route Frequency Provider Last Rate Last Admin   0.9 %  sodium chloride  infusion   Intravenous Continuous Mavrick Mcquigg, MD   Stopped at 05/30/24 1114    REVIEW OF SYSTEMS:   Constitutional: Denies fevers, chills or abnormal weight loss Eyes: Denies blurriness of vision Ears, nose, mouth, throat, and face: Denies mucositis or sore throat Respiratory: Denies cough, dyspnea or wheezes Cardiovascular: Denies palpitation, chest discomfort or lower extremity swelling Gastrointestinal:  Denies nausea, heartburn or change in bowel habits Skin: Denies abnormal skin rashes Lymphatics: Denies new lymphadenopathy or easy bruising Neurological:Denies numbness, tingling or new weaknesses Behavioral/Psych: Mood is stable, no new changes  All other systems were reviewed with the patient  and are negative.   VITALS:  Last menstrual period 08/15/2022.  Wt Readings from Last 3 Encounters:  05/30/24 164 lb 7.4 oz (74.6 kg)  05/25/24 162 lb 12.8 oz (73.8 kg)  05/05/24 168 lb 3.2 oz (76.3 kg)    There is no height or weight on file to calculate BMI.  Performance status (ECOG): 1 - Symptomatic but completely ambulatory  PHYSICAL EXAM:   GENERAL:alert, no distress and comfortable SKIN: skin color, texture,  turgor are normal, no rashes or significant lesions LYMPH:  no palpable lymphadenopathy in the cervical, axillary or inguinal LUNGS: B/L wheezing present in upper lobes HEART: regular rate & rhythm and no murmurs and no lower extremity edema ABDOMEN:abdomen soft, non-tender and normal bowel sounds Musculoskeletal:no cyanosis of digits and no clubbing  NEURO: alert & oriented x 3 with fluent speech, no focal motor/sensory deficits  LABORATORY DATA:  I have reviewed the data as listed   Lab Results  Component Value Date   WBC 7.7 05/30/2024   NEUTROABS 3.2 05/30/2024   HGB 9.0 (L) 05/30/2024   HCT 27.5 (L) 05/30/2024   MCV 87.9 05/30/2024   PLT 280 05/30/2024      Chemistry      Component Value Date/Time   NA 131 (L) 05/30/2024 0804   K 4.0 05/30/2024 0804   CL 96 (L) 05/30/2024 0804   CO2 25 05/30/2024 0804   BUN 13 05/30/2024 0804   CREATININE 1.21 (H) 05/30/2024 0804   CREATININE 1.25 (H) 11/25/2016 1053      Component Value Date/Time   CALCIUM  8.4 (L) 05/30/2024 0804   ALKPHOS 264 (H) 05/30/2024 0804   AST 38 05/30/2024 0804   ALT 13 05/30/2024 0804   BILITOT 0.7 05/30/2024 0804       RADIOGRAPHIC STUDIES: I have personally reviewed the radiological images as listed and agreed with the findings in the report.  CT CHEST ABDOMEN PELVIS W CONTRAST CLINICAL DATA:  Ovarian cancer, assess treatment response. * Tracking Code: BO *  EXAM: CT CHEST, ABDOMEN, AND PELVIS WITH CONTRAST  TECHNIQUE: Multidetector CT imaging of the chest, abdomen and pelvis was performed following the standard protocol during bolus administration of intravenous contrast.  RADIATION DOSE REDUCTION: This exam was performed according to the departmental dose-optimization program which includes automated exposure control, adjustment of the mA and/or kV according to patient size and/or use of iterative reconstruction technique.  CONTRAST:  OMNIPAQUE  IOHEXOL  300 MG/ML   SOLN  COMPARISON:  Multiple priors including CT October 19, 2023 and Jan 23, 2024  FINDINGS: CT CHEST FINDINGS  Cardiovascular: Right chest Port-A-Cath with tip near the superior cavoatrial junction. Normal caliber thoracic aorta. Normal size heart. No significant pericardial effusion/thickening.  Mediastinum/Nodes: No suspicious thyroid  nodule. Stable prominent mediastinal lymph nodes. The esophagus is grossly unremarkable.  Lungs/Pleura: Increased size of the bilateral pulmonary nodules/masses. For reference:  -pleural-based mass along the left major fissure in the lingula measures 3.3 cm on image 81/6 previously 19 mm  -pleural-based nodule in the anterior right middle lobe measures 13 mm on image 85/6 previously 12 mm.  -nodule in the superior segment of the left lower lobe measures 2.2 cm on image 42/6 previously 6 mm.  Musculoskeletal: No aggressive lytic or blastic lesion of bone.  CT ABDOMEN PELVIS FINDINGS  Hepatobiliary: There bilobar heterogeneously hypodense hepatic metastasis, for reference:  -segment VI/VII hepatic lesion measures 4.8 x 4.1 cm on image 46/2 not definitely seen on prior examination.  -segment III hepatic lesion measures 18  mm on image 48/2 previously 7 mm.  Gallbladder is unremarkable.  No biliary ductal dilation.  Pancreas: No pancreatic ductal dilation or evidence of acute inflammation.  Spleen: No splenomegaly.  Adrenals/Urinary Tract: No suspicious adrenal nodule/mass. No hydronephrosis. Kidneys demonstrate symmetric enhancement. Symmetric wall thickening of a nondistended urinary bladder.  Stomach/Bowel: Stomach is unremarkable for degree of distension. No pathologic dilation of small or large bowel. No evidence of acute bowel inflammation.  Vascular/Lymphatic: Aortic atherosclerosis.  Left periaortic lymph node with internal calcifications measures 15 mm in short axis on image 64/2 previously 10 mm.  Reproductive:  Increased fluid and nodularity in the endometrial canal. New left adnexal cyst measures 2.2 cm on image 83/2.  Other: No significant abdominopelvic free fluid.  Musculoskeletal: No aggressive lytic or blastic lesion of bone. Avascular necrosis of the right femoral head.  IMPRESSION: 1. Increased size of the bilateral pulmonary nodules/masses and new/increased size of the bilobar hepatic metastasis, consistent with worsening metastatic disease. 2. Increased fluid and nodularity in the endometrial canal. 3. New left adnexal cyst measures 2.2 cm, nonspecific. Attention on follow-up imaging suggested. 4. Increased size of the left periaortic lymph node with internal calcifications, consistent with metastatic disease. 5. Symmetric wall thickening of a nondistended urinary bladder. Correlate with urinalysis to exclude cystitis. 6. Aortic atherosclerosis.  Aortic Atherosclerosis (ICD10-I70.0).  Electronically Signed   By: Reyes Holder M.D.   On: 05/02/2024 06:25

## 2024-05-30 ENCOUNTER — Inpatient Hospital Stay

## 2024-05-30 ENCOUNTER — Inpatient Hospital Stay (HOSPITAL_BASED_OUTPATIENT_CLINIC_OR_DEPARTMENT_OTHER): Admitting: Oncology

## 2024-05-30 VITALS — BP 112/70 | HR 86 | Temp 97.7°F | Resp 20

## 2024-05-30 DIAGNOSIS — C7802 Secondary malignant neoplasm of left lung: Secondary | ICD-10-CM | POA: Diagnosis not present

## 2024-05-30 DIAGNOSIS — G620 Drug-induced polyneuropathy: Secondary | ICD-10-CM

## 2024-05-30 DIAGNOSIS — Z5112 Encounter for antineoplastic immunotherapy: Secondary | ICD-10-CM | POA: Diagnosis not present

## 2024-05-30 DIAGNOSIS — C7801 Secondary malignant neoplasm of right lung: Secondary | ICD-10-CM

## 2024-05-30 DIAGNOSIS — Z720 Tobacco use: Secondary | ICD-10-CM

## 2024-05-30 DIAGNOSIS — J449 Chronic obstructive pulmonary disease, unspecified: Secondary | ICD-10-CM | POA: Diagnosis not present

## 2024-05-30 DIAGNOSIS — I10 Essential (primary) hypertension: Secondary | ICD-10-CM

## 2024-05-30 DIAGNOSIS — C786 Secondary malignant neoplasm of retroperitoneum and peritoneum: Secondary | ICD-10-CM | POA: Diagnosis not present

## 2024-05-30 DIAGNOSIS — C538 Malignant neoplasm of overlapping sites of cervix uteri: Secondary | ICD-10-CM

## 2024-05-30 DIAGNOSIS — Z95828 Presence of other vascular implants and grafts: Secondary | ICD-10-CM

## 2024-05-30 DIAGNOSIS — Z79899 Other long term (current) drug therapy: Secondary | ICD-10-CM | POA: Diagnosis not present

## 2024-05-30 DIAGNOSIS — T451X5A Adverse effect of antineoplastic and immunosuppressive drugs, initial encounter: Secondary | ICD-10-CM

## 2024-05-30 DIAGNOSIS — C787 Secondary malignant neoplasm of liver and intrahepatic bile duct: Secondary | ICD-10-CM

## 2024-05-30 DIAGNOSIS — Z5111 Encounter for antineoplastic chemotherapy: Secondary | ICD-10-CM | POA: Diagnosis not present

## 2024-05-30 DIAGNOSIS — F17210 Nicotine dependence, cigarettes, uncomplicated: Secondary | ICD-10-CM | POA: Diagnosis not present

## 2024-05-30 DIAGNOSIS — Z79620 Long term (current) use of immunosuppressive biologic: Secondary | ICD-10-CM | POA: Diagnosis not present

## 2024-05-30 LAB — COMPREHENSIVE METABOLIC PANEL WITH GFR
ALT: 13 U/L (ref 0–44)
AST: 38 U/L (ref 15–41)
Albumin: 2.8 g/dL — ABNORMAL LOW (ref 3.5–5.0)
Alkaline Phosphatase: 264 U/L — ABNORMAL HIGH (ref 38–126)
Anion gap: 10 (ref 5–15)
BUN: 13 mg/dL (ref 6–20)
CO2: 25 mmol/L (ref 22–32)
Calcium: 8.4 mg/dL — ABNORMAL LOW (ref 8.9–10.3)
Chloride: 96 mmol/L — ABNORMAL LOW (ref 98–111)
Creatinine, Ser: 1.21 mg/dL — ABNORMAL HIGH (ref 0.44–1.00)
GFR, Estimated: 54 mL/min — ABNORMAL LOW (ref >60)
Glucose, Bld: 91 mg/dL (ref 70–99)
Potassium: 4 mmol/L (ref 3.5–5.1)
Sodium: 131 mmol/L — ABNORMAL LOW (ref 135–145)
Total Bilirubin: 0.7 mg/dL (ref 0.0–1.2)
Total Protein: 7 g/dL (ref 6.5–8.1)

## 2024-05-30 LAB — CBC WITH DIFFERENTIAL/PLATELET
Abs Immature Granulocytes: 0.03 K/uL (ref 0.00–0.07)
Basophils Absolute: 0 K/uL (ref 0.0–0.1)
Basophils Relative: 0 %
Eosinophils Absolute: 0.1 K/uL (ref 0.0–0.5)
Eosinophils Relative: 1 %
HCT: 27.5 % — ABNORMAL LOW (ref 36.0–46.0)
Hemoglobin: 9 g/dL — ABNORMAL LOW (ref 12.0–15.0)
Immature Granulocytes: 0 %
Lymphocytes Relative: 43 %
Lymphs Abs: 3.2 K/uL (ref 0.7–4.0)
MCH: 28.8 pg (ref 26.0–34.0)
MCHC: 32.7 g/dL (ref 30.0–36.0)
MCV: 87.9 fL (ref 80.0–100.0)
Monocytes Absolute: 1.2 K/uL — ABNORMAL HIGH (ref 0.1–1.0)
Monocytes Relative: 15 %
Neutro Abs: 3.2 K/uL (ref 1.7–7.7)
Neutrophils Relative %: 41 %
Platelets: 280 K/uL (ref 150–400)
RBC: 3.13 MIL/uL — ABNORMAL LOW (ref 3.87–5.11)
RDW: 17.6 % — ABNORMAL HIGH (ref 11.5–15.5)
WBC: 7.7 K/uL (ref 4.0–10.5)
nRBC: 0 % (ref 0.0–0.2)

## 2024-05-30 LAB — MAGNESIUM: Magnesium: 1.8 mg/dL (ref 1.7–2.4)

## 2024-05-30 MED ORDER — SODIUM CHLORIDE 0.9 % IV SOLN
1000.0000 mg/m2 | Freq: Once | INTRAVENOUS | Status: AC
  Administered 2024-05-30: 1862 mg via INTRAVENOUS
  Filled 2024-05-30: qty 48.97

## 2024-05-30 MED ORDER — AMLODIPINE BESYLATE 10 MG PO TABS: 10.0000 mg | ORAL_TABLET | Freq: Every day | ORAL | 3 refills | Status: DC

## 2024-05-30 MED ORDER — PROCHLORPERAZINE MALEATE 10 MG PO TABS
10.0000 mg | ORAL_TABLET | Freq: Once | ORAL | Status: AC
  Administered 2024-05-30: 10 mg via ORAL
  Filled 2024-05-30: qty 1

## 2024-05-30 MED ORDER — PROCHLORPERAZINE MALEATE 10 MG PO TABS: 10.0000 mg | ORAL_TABLET | Freq: Four times a day (QID) | ORAL | 2 refills | Status: DC | PRN

## 2024-05-30 MED ORDER — SODIUM CHLORIDE 0.9 % IV SOLN: INTRAVENOUS | Status: DC

## 2024-05-30 NOTE — Assessment & Plan Note (Deleted)
 Met static cervical carcinoma-static to lungs, liver and peritoneum First-line: Cisplatin /paclitaxel /bevacizumab /pembrolizumab  (added second cycle). Cisplatin  changed to carboplatin  for the last 3 cycles Second line: Tivdek Third line: Abraxane  Caris NGS: PD-L1: Positive, CPS: 2, no other actionable mutations PD-L1 CPS score of 8 on pathology  - We discussed and reviewed the latest CT scan in detail.  Very evident with progression of disease in the lungs and liver - Will obtain a biopsy of the lung to ensure that this is still cervical carcinoma and to rule out other primary lung carcinoma as patient is a smoker - Discussed in detail with her further options would be a single agent chemotherapies that do not have a lot of data.  Also can consider a retrial of single agent pembrolizumab  as she had PD-L1 positive disease with CPS of 8 and had prolonged control of disease with chemo and immunotherapy at the beginning.   - Patient is okay to do a trial of immunotherapy until we have the results back.  Risks versus benefits discussed in detail again.  Side effects discussed in detail including rash, thyroid  abnormalities, diarrhea, pneumonitis and colitis - Will consider another line of chemotherapy  with gemcitabine or consider single agent Cemiplimab if patient has worsening symptoms at next visit - Will obtain Caris assure to evaluate for any new actionable mutations  Return to clinic in 3 weeks for next cycle and to reevaluate other options.

## 2024-05-30 NOTE — Patient Instructions (Signed)
 [facility] [place] - A DEPT OF [facility]  Discharge Instructions: Thank you for choosing [facility] to provide your oncology and hematology care.  If you have a lab appointment with the Cancer Center - please note that after April 8th, 2024, all labs will be drawn in the cancer center.  You do not have to check in or register with the main entrance as you have in the past but will complete your check-in in the cancer center.  Wear comfortable clothing and clothing appropriate for easy access to any Portacath or PICC line.   We strive to give you quality time with your provider. You may need to reschedule your appointment if you arrive late (15 or more minutes).  Arriving late affects you and other patients whose appointments are after yours.  Also, if you miss three or more appointments without notifying the office, you may be dismissed from the clinic at the provider's discretion.      For prescription refill requests, have your pharmacy contact our office and allow 72 hours for refills to be completed.    Today you received the following chemotherapy and/or immunotherapy agents C1D1 Gemzar      To help prevent nausea and vomiting after your treatment, we encourage you to take your nausea medication as directed.  Gemcitabine Injection What is this medication? GEMCITABINE (jem SYE ta been) treats some types of cancer. It works by slowing down the growth of cancer cells. This medicine may be used for other purposes; ask your health care provider or pharmacist if you have questions. COMMON BRAND NAME(S): Gemzar, Infugem What should I tell my care team before I take this medication? They need to know if you have any of these conditions: Blood disorders Infection Kidney disease Liver disease Lung or breathing disease, such as asthma or COPD Recent or ongoing radiation therapy An unusual or allergic reaction to gemcitabine, other medications, foods,  dyes, or preservatives If you or your partner are pregnant or trying to get pregnant Breast-feeding How should I use this medication? This medication is injected into a vein. It is given by your care team in a hospital or clinic setting. Talk to your care team about the use of this medication in children. Special care may be needed. Overdosage: If you think you have taken too much of this medicine contact a poison control center or emergency room at once. NOTE: This medicine is only for you. Do not share this medicine with others. What if I miss a dose? Keep appointments for follow-up doses. It is important not to miss your dose. Call your care team if you are unable to keep an appointment. What may interact with this medication? Interactions have not been studied. This list may not describe all possible interactions. Give your health care provider a list of all the medicines, herbs, non-prescription drugs, or dietary supplements you use. Also tell them if you smoke, drink alcohol , or use illegal drugs. Some items may interact with your medicine. What should I watch for while using this medication? Your condition will be monitored carefully while you are receiving this medication. This medication may make you feel generally unwell. This is not uncommon, as chemotherapy can affect healthy cells as well as cancer cells. Report any side effects. Continue your course of treatment even though you feel ill unless your care team tells you to stop. In some cases, you may be given additional medications to help with side  effects. Follow all directions for their use. This medication may increase your risk of getting an infection. Call your care team for advice if you get a fever, chills, sore throat, or other symptoms of a cold or flu. Do not treat yourself. Try to avoid being around people who are sick. This medication may increase your risk to bruise or bleed. Call your care team if you notice any unusual  bleeding. Be careful brushing or flossing your teeth or using a toothpick because you may get an infection or bleed more easily. If you have any dental work done, tell your dentist you are receiving this medication. Avoid taking medications that contain aspirin, acetaminophen , ibuprofen , naproxen , or ketoprofen unless instructed by your care team. These medications may hide a fever. Talk to your care team if you or your partner wish to become pregnant or think you might be pregnant. This medication can cause serious birth defects if taken during pregnancy and for 6 months after the last dose. A negative pregnancy test is required before starting this medication. A reliable form of contraception is recommended while taking this medication and for 6 months after the last dose. Talk to your care team about effective forms of contraception. Do not father a child while taking this medication and for 3 months after the last dose. Use a condom while having sex during this time period. Do not breastfeed while taking this medication and for at least 1 week after the last dose. This medication may cause infertility. Talk to your care team if you are concerned about your fertility. What side effects may I notice from receiving this medication? Side effects that you should report to your care team as soon as possible: Allergic reactions--skin rash, itching, hives, swelling of the face, lips, tongue, or throat Capillary leak syndrome--stomach or muscle pain, unusual weakness or fatigue, feeling faint or lightheaded, decrease in the amount of urine, swelling of the ankles, hands, or feet, trouble breathing Infection--fever, chills, cough, sore throat, wounds that don't heal, pain or trouble when passing urine, general feeling of discomfort or being unwell Liver injury--right upper belly pain, loss of appetite, nausea, light-colored stool, dark yellow or brown urine, yellowing skin or eyes, unusual weakness or  fatigue Low red blood cell level--unusual weakness or fatigue, dizziness, headache, trouble breathing Lung injury--shortness of breath or trouble breathing, cough, spitting up blood, chest pain, fever Stomach pain, bloody diarrhea, pale skin, unusual weakness or fatigue, decrease in the amount of urine, which may be signs of hemolytic uremic syndrome Sudden and severe headache, confusion, change in vision, seizures, which may be signs of posterior reversible encephalopathy syndrome (PRES) Unusual bruising or bleeding Side effects that usually do not require medical attention (report to your care team if they continue or are bothersome): Diarrhea Drowsiness Hair loss Nausea Pain, redness, or swelling with sores inside the mouth or throat Vomiting This list may not describe all possible side effects. Call your doctor for medical advice about side effects. You may report side effects to FDA at 1-800-FDA-1088. Where should I keep my medication? This medication is given in a hospital or clinic. It will not be stored at home. NOTE: This sheet is a summary. It may not cover all possible information. If you have questions about this medicine, talk to your doctor, pharmacist, or health care provider.  2024 Elsevier/Gold Standard (2021-12-24 00:00:00)  BELOW ARE SYMPTOMS THAT SHOULD BE REPORTED IMMEDIATELY: *FEVER GREATER THAN 100.4 F (38 C) OR HIGHER *CHILLS OR SWEATING *NAUSEA  AND VOMITING THAT IS NOT CONTROLLED WITH YOUR NAUSEA MEDICATION *UNUSUAL SHORTNESS OF BREATH *UNUSUAL BRUISING OR BLEEDING *URINARY PROBLEMS (pain or burning when urinating, or frequent urination) *BOWEL PROBLEMS (unusual diarrhea, constipation, pain near the anus) TENDERNESS IN MOUTH AND THROAT WITH OR WITHOUT PRESENCE OF ULCERS (sore throat, sores in mouth, or a toothache) UNUSUAL RASH, SWELLING OR PAIN  UNUSUAL VAGINAL DISCHARGE OR ITCHING   Items with * indicate a potential emergency and should be followed up as  soon as possible or go to the Emergency Department if any problems should occur.  Please show the CHEMOTHERAPY ALERT CARD or IMMUNOTHERAPY ALERT CARD at check-in to the Emergency Department and triage nurse.  Should you have questions after your visit or need to cancel or reschedule your appointment, please contact [facility] [place] - A DEPT OF [facility] [phone]  and follow the prompts.  Office hours are 8:00 a.m. to 4:30 p.m. Monday - Friday. Please note that voicemails left after 4:00 p.m. may not be returned until the following business day.  We are closed weekends and major holidays. You have access to a nurse at all times for urgent questions. Please call the main number to the clinic [phone] and follow the prompts.  For any non-urgent questions, you may also contact your provider using MyChart. We now offer e-Visits for anyone 73 and older to request care online for non-urgent symptoms. For details visit [email or web address].   Also download the MyChart app! Go to the app store, search MyChart, open the app, select , and log in with your MyChart username and password.

## 2024-05-30 NOTE — Progress Notes (Signed)
 Patient presents today for C1D1 Gemcitabine infusion. Patient is in satisfactory condition with no new complaints voiced.  All other vital signs are stable. Patient's HR noted to be 116. Patient's HR was re-checked and noted to be 98. Labs reviewed by Dr. Davonna during the office visit and all labs are within treatment parameters.  We will proceed with treatment per MD orders.   Treatment given today per MD orders. Tolerated infusion without adverse affects. Vital signs stable. No complaints at this time. Discharged from clinic ambulatory in stable condition. Alert and oriented x 3. F/U with [facility] as scheduled.

## 2024-05-30 NOTE — Progress Notes (Signed)
 Pharmacist Chemotherapy Monitoring - Initial Assessment    Anticipated start date: 05/30/24   The following has been reviewed per standard work regarding the patient's treatment regimen: The patient's diagnosis, treatment plan and drug doses, and organ/hematologic function Lab orders and baseline tests specific to treatment regimen  The treatment plan start date, drug sequencing, and pre-medications Prior authorization status  Patient's documented medication list, including drug-drug interaction screen and prescriptions for anti-emetics and supportive care specific to the treatment regimen The drug concentrations, fluid compatibility, administration routes, and timing of the medications to be used The patient's access for treatment and lifetime cumulative dose history, if applicable  The patient's medication allergies and previous infusion related reactions, if applicable   Changes made to treatment plan:  N/A  Follow up needed:  N/A   Kristin Carson, [facility], 05/30/2024  9:47 AM

## 2024-05-30 NOTE — Assessment & Plan Note (Addendum)
 Patient reports stable pain in her hands and feet that is controlled with Lyrica   - Continue Lyrica  as prescribed

## 2024-05-30 NOTE — Progress Notes (Signed)
 DISCONTINUE OFF PATHWAY REGIMEN - Other   [id number]:Pembrolizumab  200 mg IV D1 q21 Days:   A cycle is every 21 days:     Pembrolizumab    **Always confirm dose/schedule in your pharmacy ordering system**  PRIOR TREATMENT: Pembrolizumab  200 mg IV D1 q21 Days  START OFF PATHWAY REGIMEN - Other   [id number]:Gemcitabine 1,000 mg/m2 IV D1,8 q21 Days:   A cycle is every 21 days:     Gemcitabine   **Always confirm dose/schedule in your pharmacy ordering system**  Patient Characteristics: Intent of Therapy: Non-Curative / Palliative Intent, Discussed with Patient

## 2024-05-30 NOTE — Progress Notes (Signed)
   05/30/24 1019  Spiritual Encounters  Type of Visit Attempt (pt unavailable)  Referral source Chaplain assessment  Reason for visit Routine spiritual support  OnCall Visit No   Chaplain attempted to follow-up with Pt today, but upon entering the room the Pt was asleep.  There was a person present- a female, possibly Samayra's husband.  I was not able to speak with her.  I will re-schedule a follow-up with Rock at her next appointment.  I will also attempt to reach out by telephone, but that was unsuccessful the last time I tried.  Maude Roll, MDiv  Chaplain, [facility] [email or web address] [phone]

## 2024-05-30 NOTE — Assessment & Plan Note (Addendum)
 Patient is a current smoker and is very motivated to quit  - Encouraged smoking cessation efforts

## 2024-05-30 NOTE — Assessment & Plan Note (Addendum)
 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 Cisplatin  changed to carboplatin  for the last 3 cycles Second line: Tivdek Third line: Abraxane  Caris NGS: PD-L1: Positive, CPS: 2, no other actionable mutations PD-L1 CPS score of 8 on pathology 04/25/2024: CT scan with progression of disease  -Patient was seen by Dr. Isadora and is scheduled for CT chest super D followed by EBUS with biopsy. Will follow up on this - Discussed in detail with her further options would be a single agent chemotherapies that do not have a lot of data.  Patient received 1 dose of pembrolizumab  that she progressed on previously - Discussed with the patient that the next line of treatment option would be single agent gemcitabine.  The patient is agreeable to the change in treatment. -Discussed side effects of gemcitabine in detail. It can cause common side effects such as nausea, vomiting, fatigue, fever, and low blood counts, leading to increased risk of infection, anemia, and bleeding. Serious side effects may include allergic reactions, capillary leak syndrome, liver or lung injury, and neurological symptoms like confusion or seizures. - Will proceed with gemcitabine 1000 mg/m every 2 weeks. - Will repeat CT scan after 3 months to assess for response  Return to clinic in 4 weeks for next cycle

## 2024-05-31 ENCOUNTER — Encounter
Admission: RE | Admit: 2024-05-31 | Discharge: 2024-05-31 | Disposition: A | Discharge status: Home / Self Care | Source: Ambulatory Visit | Attending: Student in an Organized Health Care Education/Training Program | Admitting: Student in an Organized Health Care Education/Training Program

## 2024-05-31 ENCOUNTER — Telehealth (HOSPITAL_COMMUNITY): Payer: Self-pay | Admitting: *Deleted

## 2024-05-31 ENCOUNTER — Other Ambulatory Visit: Payer: Self-pay

## 2024-05-31 DIAGNOSIS — J449 Chronic obstructive pulmonary disease, unspecified: Secondary | ICD-10-CM | POA: Diagnosis not present

## 2024-05-31 HISTORY — DX: Dyspnea, unspecified: R06.00

## 2024-05-31 NOTE — Telephone Encounter (Signed)
 24 hour chemotherapy call placed today but no answer. VM left stating if patient needed anything to call the clinic.

## 2024-05-31 NOTE — Patient Instructions (Addendum)
 Your procedure is scheduled on: 06/07/24 - Tuesday Report to the Registration Desk on the 1st floor of the [facility]. To find out your arrival time, please call [phone] between 1PM - 3PM on: 06/06/24 - Monday If your arrival time is 6:00 am, do not arrive before that time as the [facility] entrance doors do not open until 6:00 am.  REMEMBER: Instructions that are not followed completely may result in serious medical risk, up to and including death; or upon the discretion of your surgeon and anesthesiologist your surgery may need to be rescheduled.  Do not eat food or drink any liquids after midnight the night before surgery.  No gum chewing or hard candies.  One week prior to surgery: Stop Anti-inflammatories (NSAIDS) such as Advil , Aleve , Ibuprofen , Motrin , Naproxen , Naprosyn  and Aspirin based products such as Excedrin, Goody's Powder, BC Powder. You may take Tylenol  and Oxycodone  if needed for pain up until the day of surgery.  Stop ANY OVER THE COUNTER supplements until after surgery.  ON THE DAY OF SURGERY ONLY TAKE THESE MEDICATIONS WITH SIPS OF WATER:  amLODipine  (NORVASC )  budesonide -formoterol   pregabalin  (LYRICA )  SPIRIVA  RESPIMAT)   Use inhalers on the day of surgery and bring to the hospital.   No Alcohol  for 24 hours before or after surgery.  No Smoking including e-cigarettes for 24 hours before surgery.  No chewable tobacco products for at least 6 hours before surgery.  No nicotine  patches on the day of surgery.  Do not use any recreational drugs for at least a week (preferably 2 weeks) before your surgery.  Please be advised that the combination of cocaine and anesthesia may have negative outcomes, up to and including death. If you test positive for cocaine, your surgery will be cancelled.  On the morning of surgery brush your teeth with toothpaste and water, you may rinse your mouth with mouthwash if you wish. Do not swallow any toothpaste or  mouthwash.  Do not wear jewelry, make-up, hairpins, clips or nail polish.  For welded (permanent) jewelry: bracelets, anklets, waist bands, etc.  Please have this removed prior to surgery.  If it is not removed, there is a chance that hospital personnel will need to cut it off on the day of surgery.  Do not wear lotions, powders, or perfumes.   Do not shave body hair from the neck down 48 hours before surgery.  Contact lenses, hearing aids and dentures may not be worn into surgery.  Do not bring valuables to the hospital. [facility] is not responsible for any missing/lost belongings or valuables.   Notify your doctor if there is any change in your medical condition (cold, fever, infection).  Wear comfortable clothing (specific to your surgery type) to the hospital.  After surgery, you can help prevent lung complications by doing breathing exercises.  Take deep breaths and cough every 1-2 hours. Your doctor may order a device called an Incentive Spirometer to help you take deep breaths.  When coughing or sneezing, hold a pillow firmly against your incision with both hands. This is called "splinting." Doing this helps protect your incision. It also decreases belly discomfort.  If you are being admitted to the hospital overnight, leave your suitcase in the car. After surgery it may be brought to your room.  In case of increased patient census, it may be necessary for you, the patient, to continue your postoperative care in the Same Day Surgery department.  If you are being discharged the day  of surgery, you will not be allowed to drive home. You will need a responsible individual to drive you home and stay with you for 24 hours after surgery.   If you are taking public transportation, you will need to have a responsible individual with you.  Please call the Pre-admissions Testing Dept. at [phone] if you have any questions about these instructions.  Surgery Visitation  Policy:  Patients having surgery or a procedure may have two visitors.  Children under the age of 8 must have an adult with them who is not the patient.  Inpatient Visitation:    Visiting hours are 7 a.m. to 8 p.m. Up to four visitors are allowed at one time in a patient room. The visitors may rotate out with other people during the day.  One visitor age 60 or older may stay with the patient overnight and must be in the room by 8 p.m.   Merchandiser, retail to address health-related social needs:  [email or web address]

## 2024-06-01 DIAGNOSIS — J449 Chronic obstructive pulmonary disease, unspecified: Secondary | ICD-10-CM | POA: Diagnosis not present

## 2024-06-02 DIAGNOSIS — J449 Chronic obstructive pulmonary disease, unspecified: Secondary | ICD-10-CM | POA: Diagnosis not present

## 2024-06-03 DIAGNOSIS — J449 Chronic obstructive pulmonary disease, unspecified: Secondary | ICD-10-CM | POA: Diagnosis not present

## 2024-06-04 DIAGNOSIS — J449 Chronic obstructive pulmonary disease, unspecified: Secondary | ICD-10-CM | POA: Diagnosis not present

## 2024-06-06 ENCOUNTER — Ambulatory Visit (HOSPITAL_COMMUNITY)
Admission: RE | Admit: 2024-06-06 | Discharge: 2024-06-06 | Disposition: A | Discharge status: Home / Self Care | Source: Ambulatory Visit | Attending: Student in an Organized Health Care Education/Training Program | Admitting: Student in an Organized Health Care Education/Training Program

## 2024-06-06 DIAGNOSIS — R918 Other nonspecific abnormal finding of lung field: Secondary | ICD-10-CM | POA: Insufficient documentation

## 2024-06-06 DIAGNOSIS — C787 Secondary malignant neoplasm of liver and intrahepatic bile duct: Secondary | ICD-10-CM | POA: Diagnosis not present

## 2024-06-06 DIAGNOSIS — C7800 Secondary malignant neoplasm of unspecified lung: Secondary | ICD-10-CM | POA: Diagnosis not present

## 2024-06-06 DIAGNOSIS — J449 Chronic obstructive pulmonary disease, unspecified: Secondary | ICD-10-CM | POA: Diagnosis not present

## 2024-06-06 DIAGNOSIS — C569 Malignant neoplasm of unspecified ovary: Secondary | ICD-10-CM | POA: Diagnosis not present

## 2024-06-06 DIAGNOSIS — J439 Emphysema, unspecified: Secondary | ICD-10-CM | POA: Diagnosis not present

## 2024-06-07 ENCOUNTER — Ambulatory Visit
Admission: RE | Admit: 2024-06-07 | Discharge: 2024-06-07 | Disposition: A | Discharge status: Home / Self Care | Attending: Student in an Organized Health Care Education/Training Program | Admitting: Student in an Organized Health Care Education/Training Program

## 2024-06-07 ENCOUNTER — Encounter: Payer: Self-pay | Admitting: Student in an Organized Health Care Education/Training Program

## 2024-06-07 ENCOUNTER — Ambulatory Visit

## 2024-06-07 ENCOUNTER — Encounter
Admission: RE | Disposition: A | Discharge status: Home / Self Care | Payer: Self-pay | Source: Home / Self Care | Attending: Student in an Organized Health Care Education/Training Program

## 2024-06-07 ENCOUNTER — Ambulatory Visit: Payer: Self-pay

## 2024-06-07 ENCOUNTER — Other Ambulatory Visit: Payer: Self-pay

## 2024-06-07 ENCOUNTER — Ambulatory Visit: Payer: Self-pay | Admitting: Urgent Care

## 2024-06-07 ENCOUNTER — Ambulatory Visit: Admitting: General Practice

## 2024-06-07 DIAGNOSIS — I110 Hypertensive heart disease with heart failure: Secondary | ICD-10-CM | POA: Insufficient documentation

## 2024-06-07 DIAGNOSIS — J45909 Unspecified asthma, uncomplicated: Secondary | ICD-10-CM | POA: Diagnosis not present

## 2024-06-07 DIAGNOSIS — C539 Malignant neoplasm of cervix uteri, unspecified: Secondary | ICD-10-CM | POA: Diagnosis not present

## 2024-06-07 DIAGNOSIS — Z8541 Personal history of malignant neoplasm of cervix uteri: Secondary | ICD-10-CM | POA: Insufficient documentation

## 2024-06-07 DIAGNOSIS — J4489 Other specified chronic obstructive pulmonary disease: Secondary | ICD-10-CM | POA: Insufficient documentation

## 2024-06-07 DIAGNOSIS — G43909 Migraine, unspecified, not intractable, without status migrainosus: Secondary | ICD-10-CM | POA: Diagnosis not present

## 2024-06-07 DIAGNOSIS — F329 Major depressive disorder, single episode, unspecified: Secondary | ICD-10-CM | POA: Diagnosis not present

## 2024-06-07 DIAGNOSIS — R918 Other nonspecific abnormal finding of lung field: Secondary | ICD-10-CM | POA: Diagnosis not present

## 2024-06-07 DIAGNOSIS — F17210 Nicotine dependence, cigarettes, uncomplicated: Secondary | ICD-10-CM | POA: Insufficient documentation

## 2024-06-07 DIAGNOSIS — Z7951 Long term (current) use of inhaled steroids: Secondary | ICD-10-CM | POA: Insufficient documentation

## 2024-06-07 DIAGNOSIS — Z48813 Encounter for surgical aftercare following surgery on the respiratory system: Secondary | ICD-10-CM | POA: Diagnosis not present

## 2024-06-07 DIAGNOSIS — I509 Heart failure, unspecified: Secondary | ICD-10-CM | POA: Diagnosis not present

## 2024-06-07 DIAGNOSIS — Z452 Encounter for adjustment and management of vascular access device: Secondary | ICD-10-CM | POA: Diagnosis not present

## 2024-06-07 DIAGNOSIS — N189 Chronic kidney disease, unspecified: Secondary | ICD-10-CM | POA: Diagnosis not present

## 2024-06-07 DIAGNOSIS — C7802 Secondary malignant neoplasm of left lung: Secondary | ICD-10-CM | POA: Insufficient documentation

## 2024-06-07 DIAGNOSIS — D649 Anemia, unspecified: Secondary | ICD-10-CM | POA: Diagnosis not present

## 2024-06-07 DIAGNOSIS — J449 Chronic obstructive pulmonary disease, unspecified: Secondary | ICD-10-CM | POA: Diagnosis not present

## 2024-06-07 DIAGNOSIS — I130 Hypertensive heart and chronic kidney disease with heart failure and stage 1 through stage 4 chronic kidney disease, or unspecified chronic kidney disease: Secondary | ICD-10-CM | POA: Diagnosis not present

## 2024-06-07 HISTORY — PX: VIDEO BRONCHOSCOPY WITH ENDOBRONCHIAL NAVIGATION: SHX6175

## 2024-06-07 SURGERY — VIDEO BRONCHOSCOPY WITH ENDOBRONCHIAL NAVIGATION
Anesthesia: General | Laterality: Bilateral

## 2024-06-07 MED ORDER — PROPOFOL 1000 MG/100ML IV EMUL
INTRAVENOUS | Status: AC
  Filled 2024-06-07: qty 100

## 2024-06-07 MED ORDER — OXYCODONE HCL 5 MG/5ML PO SOLN: 5.0000 mg | Freq: Once | ORAL | Status: DC | PRN

## 2024-06-07 MED ORDER — FENTANYL CITRATE (PF) 100 MCG/2ML IJ SOLN: 25.0000 ug | INTRAMUSCULAR | Status: DC | PRN

## 2024-06-07 MED ORDER — PROPOFOL 10 MG/ML IV BOLUS
INTRAVENOUS | Status: AC
Start: 2024-06-07 — End: 2024-06-07
  Filled 2024-06-07: qty 20

## 2024-06-07 MED ORDER — FENTANYL CITRATE (PF) 100 MCG/2ML IJ SOLN
INTRAMUSCULAR | Status: AC
  Filled 2024-06-07: qty 2

## 2024-06-07 MED ORDER — PROPOFOL 1000 MG/100ML IV EMUL
INTRAVENOUS | Status: AC
Start: 2024-06-07 — End: 2024-06-07
  Filled 2024-06-07: qty 100

## 2024-06-07 MED ORDER — PHENYLEPHRINE 80 MCG/ML (10ML) SYRINGE FOR IV PUSH (FOR BLOOD PRESSURE SUPPORT)
PREFILLED_SYRINGE | INTRAVENOUS | Status: AC
  Filled 2024-06-07: qty 10

## 2024-06-07 MED ORDER — ONDANSETRON HCL 4 MG/2ML IJ SOLN
INTRAMUSCULAR | Status: DC | PRN
  Administered 2024-06-07: 4 mg via INTRAVENOUS

## 2024-06-07 MED ORDER — EPHEDRINE 5 MG/ML INJ
INTRAVENOUS | Status: AC
  Filled 2024-06-07: qty 5

## 2024-06-07 MED ORDER — OXYCODONE HCL 5 MG PO TABS: 5.0000 mg | ORAL_TABLET | Freq: Once | ORAL | Status: DC | PRN

## 2024-06-07 MED ORDER — GLYCOPYRROLATE 0.2 MG/ML IJ SOLN
INTRAMUSCULAR | Status: AC
Start: 2024-06-07 — End: 2024-06-07
  Filled 2024-06-07: qty 1

## 2024-06-07 MED ORDER — DEXAMETHASONE SODIUM PHOSPHATE 10 MG/ML IJ SOLN
INTRAMUSCULAR | Status: AC
Start: 2024-06-07 — End: 2024-06-07
  Filled 2024-06-07: qty 1

## 2024-06-07 MED ORDER — MIDAZOLAM HCL 2 MG/2ML IJ SOLN
INTRAMUSCULAR | Status: AC
  Filled 2024-06-07: qty 2

## 2024-06-07 MED ORDER — MIDAZOLAM HCL 2 MG/2ML IJ SOLN
INTRAMUSCULAR | Status: DC | PRN
  Administered 2024-06-07: 2 mg via INTRAVENOUS

## 2024-06-07 MED ORDER — ONDANSETRON HCL 4 MG/2ML IJ SOLN
INTRAMUSCULAR | Status: AC
  Filled 2024-06-07: qty 4

## 2024-06-07 MED ORDER — PHENYLEPHRINE 80 MCG/ML (10ML) SYRINGE FOR IV PUSH (FOR BLOOD PRESSURE SUPPORT)
PREFILLED_SYRINGE | INTRAVENOUS | Status: DC | PRN
  Administered 2024-06-07 (×5): 160 ug via INTRAVENOUS

## 2024-06-07 MED ORDER — ONDANSETRON HCL 4 MG/2ML IJ SOLN
INTRAMUSCULAR | Status: AC
  Filled 2024-06-07: qty 2

## 2024-06-07 MED ORDER — LIDOCAINE HCL (CARDIAC) PF 100 MG/5ML IV SOSY
PREFILLED_SYRINGE | INTRAVENOUS | Status: DC | PRN
  Administered 2024-06-07: 80 mg via INTRAVENOUS

## 2024-06-07 MED ORDER — ORAL CARE MOUTH RINSE: 15.0000 mL | Freq: Once | OROMUCOSAL | Status: DC

## 2024-06-07 MED ORDER — IPRATROPIUM-ALBUTEROL 0.5-2.5 (3) MG/3ML IN SOLN
RESPIRATORY_TRACT | Status: AC
  Filled 2024-06-07: qty 3

## 2024-06-07 MED ORDER — LACTATED RINGERS IV SOLN: INTRAVENOUS | Status: DC

## 2024-06-07 MED ORDER — IPRATROPIUM-ALBUTEROL 0.5-2.5 (3) MG/3ML IN SOLN
3.0000 mL | Freq: Once | RESPIRATORY_TRACT | Status: AC
  Administered 2024-06-07: 3 mL via RESPIRATORY_TRACT

## 2024-06-07 MED ORDER — DEXAMETHASONE SODIUM PHOSPHATE 10 MG/ML IJ SOLN
INTRAMUSCULAR | Status: DC | PRN
  Administered 2024-06-07: 10 mg via INTRAVENOUS

## 2024-06-07 MED ORDER — ROCURONIUM BROMIDE 10 MG/ML (PF) SYRINGE
PREFILLED_SYRINGE | INTRAVENOUS | Status: AC
Start: 2024-06-07 — End: 2024-06-07
  Filled 2024-06-07: qty 10

## 2024-06-07 MED ORDER — ROCURONIUM BROMIDE 100 MG/10ML IV SOLN
INTRAVENOUS | Status: DC | PRN
  Administered 2024-06-07: 50 mg via INTRAVENOUS

## 2024-06-07 MED ORDER — ROCURONIUM BROMIDE 10 MG/ML (PF) SYRINGE
PREFILLED_SYRINGE | INTRAVENOUS | Status: AC
  Filled 2024-06-07: qty 10

## 2024-06-07 MED ORDER — PROPOFOL 500 MG/50ML IV EMUL
INTRAVENOUS | Status: DC | PRN
  Administered 2024-06-07: 150 ug/kg/min via INTRAVENOUS

## 2024-06-07 MED ORDER — PROPOFOL 10 MG/ML IV BOLUS
INTRAVENOUS | Status: DC | PRN
  Administered 2024-06-07: 150 mg via INTRAVENOUS

## 2024-06-07 MED ORDER — LIDOCAINE HCL (PF) 2 % IJ SOLN
INTRAMUSCULAR | Status: AC
Start: 2024-06-07 — End: 2024-06-07
  Filled 2024-06-07: qty 15

## 2024-06-07 MED ORDER — CHLORHEXIDINE GLUCONATE 0.12 % MT SOLN: 15.0000 mL | Freq: Once | OROMUCOSAL | Status: DC

## 2024-06-07 MED ORDER — DEXAMETHASONE SODIUM PHOSPHATE 10 MG/ML IJ SOLN
INTRAMUSCULAR | Status: AC
  Filled 2024-06-07: qty 2

## 2024-06-07 MED ORDER — FENTANYL CITRATE (PF) 100 MCG/2ML IJ SOLN
INTRAMUSCULAR | Status: DC | PRN
  Administered 2024-06-07: 50 ug via INTRAVENOUS

## 2024-06-07 NOTE — Anesthesia Procedure Notes (Signed)
 Procedure Name: Intubation Date/Time: 06/07/2024 8:09 AM  Performed by: Leavy Ned, MDPre-anesthesia Checklist: Patient identified, Emergency Drugs available, Suction available and Patient being monitored Patient Re-evaluated:Patient Re-evaluated prior to induction Oxygen  Delivery Method: Circle system utilized Preoxygenation: Pre-oxygenation with 100% oxygen  Induction Type: IV induction Ventilation: Mask ventilation without difficulty and Oral airway inserted - appropriate to patient size Laryngoscope Size: McGrath and 3 Grade View: Grade I Tube type: Oral Tube size: 8.5 mm Number of attempts: 1 Airway Equipment and Method: Stylet and Oral airway Placement Confirmation: ETT inserted through vocal cords under direct vision, positive ETCO2 and breath sounds checked- equal and bilateral Secured at: 17 cm Tube secured with: Tape Dental Injury: Teeth and Oropharynx as per pre-operative assessment

## 2024-06-07 NOTE — Procedures (Signed)
 Procedure Note  Patient: Kristin Carson  GE 3D [facility] mobile C-arm was utilized to identify and biopsy LUL mass.  Needle-in-lesion was confirmed using real-time GE 3D OEC imaging, and images were uploaded to PACS.     Belva November, MD [place] 06/07/2024 9:09 AM

## 2024-06-07 NOTE — Anesthesia Preprocedure Evaluation (Signed)
 Anesthesia Evaluation  Patient identified by MRN, date of birth, ID band Patient awake    Reviewed: Allergy & Precautions, NPO status , Patient's Chart, lab work & pertinent test results  Airway Mallampati: III  TM Distance: >3 FB Neck ROM: full    Dental  (+) Chipped   Pulmonary asthma , COPD,  COPD inhaler, Current Smoker   Pulmonary exam normal        Cardiovascular hypertension, +CHF  Normal cardiovascular exam     Neuro/Psych  Headaches PSYCHIATRIC DISORDERS  Depression       GI/Hepatic Neg liver ROS,GERD  ,,  Endo/Other  negative endocrine ROS    Renal/GU Renal disease     Musculoskeletal   Abdominal   Peds  Hematology  (+) Blood dyscrasia, anemia   Anesthesia Other Findings Past Medical History: No date: Anemia No date: Asthma No date: Bronchitis No date: Cancer (HCC)     Comment:  Cervical No date: CHF (congestive heart failure) (HCC) No date: COPD (chronic obstructive pulmonary disease) (HCC) No date: Depression No date: Dyspnea No date: GERD (gastroesophageal reflux disease) No date: Headache No date: Hypertension 09/25/2022: Port-A-Cath in place  Past Surgical History: No date: BREAST SURGERY No date: CESAREAN SECTION 09/30/2022: IR IMAGING GUIDED PORT INSERTION No date: LEG SURGERY  BMI    Body Mass Index: 30.23 kg/m      Reproductive/Obstetrics negative OB ROS                              Anesthesia Physical Anesthesia Plan  ASA: 3  Anesthesia Plan: General ETT   Post-op Pain Management:    Induction: Intravenous  PONV Risk Score and Plan: 3 and Ondansetron , Dexamethasone  and Midazolam   Airway Management Planned: Oral ETT  Additional Equipment:   Intra-op Plan:   Post-operative Plan: Extubation in OR  Informed Consent: I have reviewed the patients History and Physical, chart, labs and discussed the procedure including the risks, benefits and  alternatives for the proposed anesthesia with the patient or authorized representative who has indicated his/her understanding and acceptance.     Dental Advisory Given  Plan Discussed with: Anesthesiologist, CRNA and Surgeon  Anesthesia Plan Comments: (Patient consented for risks of anesthesia including but not limited to:  - adverse reactions to medications - damage to eyes, teeth, lips or other oral mucosa - nerve damage due to positioning  - sore throat or hoarseness - Damage to heart, brain, nerves, lungs, other parts of body or loss of life  Patient voiced understanding and assent.)         Anesthesia Quick Evaluation

## 2024-06-07 NOTE — Interval H&P Note (Signed)
 Patient is presenting for biopsy of multiple pulmonary masses that are concerning for malignancy. She is to undergo robotic assisted navigational bronchoscopy with EBUS. Risks and benefits of the procedure were discussed, including risk of bleed and pneumothorax. She is asymptomatic at this time without change in clinical status. She is appropriate for the procedure and consents to proceed. All the questions were answered.  Belva November, MD [employer] [facility] 06/07/2024 7:56 AM

## 2024-06-07 NOTE — Op Note (Signed)
 Video Bronchoscopy with Robotic Assisted Bronchoscopic Navigation   Date of Operation: 06/07/2024   Pre-op Diagnosis: lung mass  Surgeon: Isadora  Anesthesia: General endotracheal anesthesia  Operation: Flexible video fiberoptic bronchoscopy with robotic assistance and biopsies.  Estimated Blood Loss: Minimal  Complications: None  Indications and History: Kristin Carson is a 53 y.o. female with history of cervical cancer.  Recommendation made to achieve a tissue diagnosis via robotic assisted navigational bronchoscopy.  The risks, benefits, complications, treatment options and expected outcomes were discussed with the patient.  The possibilities of pneumothorax, pneumonia, reaction to medication, pulmonary aspiration, perforation of a viscus, bleeding, failure to diagnose a condition and creating a complication requiring transfusion or operation were discussed with the patient who freely signed the consent.    Description of Procedure: The patient was seen in the Preoperative Area, was examined and was deemed appropriate to proceed.  The patient was taken to [facility] procedure room, identified as [facility] CHRISTELLA [facility] and the procedure verified as Flexible Video Fiberoptic Bronchoscopy.  A Time Out was held and the above information confirmed.   Prior to the date of the procedure a high-resolution CT scan of the chest was performed. Utilizing ION software program a virtual tracheobronchial tree was generated to allow the creation of distinct navigation pathways to the patient's parenchymal abnormalities. After being taken to the operating room general anesthesia was initiated and the patient  was orally intubated. The video fiberoptic bronchoscope was introduced via the endotracheal tube and a general inspection was performed which showed normal right and left lung anatomy. Aspiration of the bilateral mainstems was completed to remove any remaining secretions. Robotic catheter inserted into patient's  endotracheal tube.   Target #1 LUL Mass: The distinct navigation pathways prepared prior to this procedure were then utilized to navigate to patient's lesion identified on CT scan. The robotic catheter was secured into place and the vision probe was withdrawn.  Lesion location was approximated using fluoroscopy and radial EBUS. An eccentric signal was obtained with the radial EBUS.  Local registration and targeting was performed using GE 3D OEC mobile C-arm three-dimensional imaging. Under fluoroscopic guidance transbronchial brushings, transbronchial needle biopsies, and transbronchial forceps biopsies were performed to be sent for cytology and pathology.  Needle-in-lesion was confirmed using GE 3D OEC mobile C-arm.  A bronchioalveolar lavage was performed in the LUL and sent for cytology.     Target #2 EBUS:  The EBUS bronchoscope was introduced and the hilar and mediastinal lymph node stations were examined. Stations 7 and 11L were noted to be enlarged. These were biopsied with a 21G Olympus ViziShot 2 needle. Samples were sent for cytology.  At the end of the procedure a general airway inspection was performed and there was no evidence of active bleeding. The bronchoscope was removed.  The patient tolerated the procedure well. There was no significant blood loss and there were no obvious complications. A post-procedural chest x-ray is pending.  Samples Target #1: LUL mass 1. Transbronchial needle brushings from LUL mass 2. Transbronchial Wang needle biopsies from LUL mass 3. Transbronchial forceps biopsies from LUL mass 4. Bronchoalveolar lavage from LUL - Lingular division  Samples Target #2: EBUS to stations 7 and 11L  Plans:  The patient will be discharged from the PACU to home when recovered from anesthesia and after chest x-ray is reviewed. We will review the cytology, pathology and microbiology results with the patient when they become available. Outpatient followup will be with Dr.  Davonna.   [employer]  Isadora, MD [facility] 06/07/2024 9:08 AM

## 2024-06-07 NOTE — Anesthesia Postprocedure Evaluation (Signed)
 Anesthesia Post Note  Patient: BRONDA ALFRED  Procedure(s) Performed: VIDEO BRONCHOSCOPY WITH ENDOBRONCHIAL NAVIGATION (Bilateral)  Patient location during evaluation: PACU Anesthesia Type: General Level of consciousness: awake and alert Pain management: pain level controlled Vital Signs Assessment: post-procedure vital signs reviewed and stable Respiratory status: spontaneous breathing, nonlabored ventilation, respiratory function stable and patient connected to nasal cannula oxygen  Cardiovascular status: blood pressure returned to baseline and stable Postop Assessment: no apparent nausea or vomiting Anesthetic complications: no   No notable events documented.   Last Vitals:  Vitals:   06/07/24 0958 06/07/24 1009  BP: 123/74 116/75  Pulse: 88 98  Resp: 20 19  Temp: 36.7 C 37 C  SpO2: 98% 93%    Last Pain:  Vitals:   06/07/24 1009  TempSrc: Temporal  PainSc: 0-No pain                 Debby Mines

## 2024-06-07 NOTE — Transfer of Care (Signed)
 Immediate Anesthesia Transfer of Care Note  Patient: Kristin Carson  Procedure(s) Performed: VIDEO BRONCHOSCOPY WITH ENDOBRONCHIAL NAVIGATION (Bilateral)  Patient Location: PACU  Anesthesia Type:General  Level of Consciousness: drowsy and patient cooperative  Airway & Oxygen  Therapy: Patient Spontanous Breathing and Patient connected to face mask oxygen   Post-op Assessment: Report given to RN, Post -op Vital signs reviewed and stable, and Patient moving all extremities X 4  Post vital signs: Reviewed and stable  Last Vitals:  Vitals Value Taken Time  BP 116/99 06/07/24 09:15  Temp    Pulse 99 06/07/24 09:18  Resp 24 06/07/24 09:18  SpO2 98 % 06/07/24 09:18  Vitals shown include unfiled device data.  Last Pain:  Vitals:   06/07/24 0732  PainSc: 8          Complications: No notable events documented.

## 2024-06-08 ENCOUNTER — Encounter: Payer: Self-pay | Admitting: Student in an Organized Health Care Education/Training Program

## 2024-06-08 ENCOUNTER — Encounter: Payer: Self-pay | Admitting: Oncology

## 2024-06-08 DIAGNOSIS — J449 Chronic obstructive pulmonary disease, unspecified: Secondary | ICD-10-CM | POA: Diagnosis not present

## 2024-06-08 LAB — ACID FAST SMEAR (AFB, MYCOBACTERIA): Acid Fast Smear: NEGATIVE

## 2024-06-09 DIAGNOSIS — J449 Chronic obstructive pulmonary disease, unspecified: Secondary | ICD-10-CM | POA: Diagnosis not present

## 2024-06-09 LAB — CULTURE, RESPIRATORY W GRAM STAIN
Culture: NO GROWTH
Gram Stain: NONE SEEN

## 2024-06-10 DIAGNOSIS — J449 Chronic obstructive pulmonary disease, unspecified: Secondary | ICD-10-CM | POA: Diagnosis not present

## 2024-06-10 LAB — CYTOLOGY - NON PAP

## 2024-06-10 LAB — SURGICAL PATHOLOGY

## 2024-06-11 DIAGNOSIS — J449 Chronic obstructive pulmonary disease, unspecified: Secondary | ICD-10-CM | POA: Diagnosis not present

## 2024-06-13 ENCOUNTER — Inpatient Hospital Stay

## 2024-06-13 ENCOUNTER — Inpatient Hospital Stay: Attending: Hematology

## 2024-06-13 VITALS — BP 101/69 | HR 95 | Temp 98.8°F | Resp 16

## 2024-06-13 DIAGNOSIS — I493 Ventricular premature depolarization: Secondary | ICD-10-CM | POA: Diagnosis not present

## 2024-06-13 DIAGNOSIS — G620 Drug-induced polyneuropathy: Secondary | ICD-10-CM | POA: Diagnosis not present

## 2024-06-13 DIAGNOSIS — C786 Secondary malignant neoplasm of retroperitoneum and peritoneum: Secondary | ICD-10-CM | POA: Diagnosis not present

## 2024-06-13 DIAGNOSIS — J449 Chronic obstructive pulmonary disease, unspecified: Secondary | ICD-10-CM | POA: Diagnosis not present

## 2024-06-13 DIAGNOSIS — Z5111 Encounter for antineoplastic chemotherapy: Secondary | ICD-10-CM | POA: Diagnosis not present

## 2024-06-13 DIAGNOSIS — C538 Malignant neoplasm of overlapping sites of cervix uteri: Secondary | ICD-10-CM | POA: Insufficient documentation

## 2024-06-13 DIAGNOSIS — C7801 Secondary malignant neoplasm of right lung: Secondary | ICD-10-CM | POA: Insufficient documentation

## 2024-06-13 DIAGNOSIS — R000 Tachycardia, unspecified: Secondary | ICD-10-CM | POA: Insufficient documentation

## 2024-06-13 DIAGNOSIS — C787 Secondary malignant neoplasm of liver and intrahepatic bile duct: Secondary | ICD-10-CM | POA: Diagnosis not present

## 2024-06-13 DIAGNOSIS — C7802 Secondary malignant neoplasm of left lung: Secondary | ICD-10-CM | POA: Insufficient documentation

## 2024-06-13 DIAGNOSIS — Z95828 Presence of other vascular implants and grafts: Secondary | ICD-10-CM

## 2024-06-13 LAB — CBC WITH DIFFERENTIAL/PLATELET
Abs Immature Granulocytes: 0.04 K/uL (ref 0.00–0.07)
Basophils Absolute: 0 K/uL (ref 0.0–0.1)
Basophils Relative: 0 %
Eosinophils Absolute: 0.1 K/uL (ref 0.0–0.5)
Eosinophils Relative: 2 %
HCT: 26.3 % — ABNORMAL LOW (ref 36.0–46.0)
Hemoglobin: 8.6 g/dL — ABNORMAL LOW (ref 12.0–15.0)
Immature Granulocytes: 1 %
Lymphocytes Relative: 32 %
Lymphs Abs: 2.4 K/uL (ref 0.7–4.0)
MCH: 28.9 pg (ref 26.0–34.0)
MCHC: 32.7 g/dL (ref 30.0–36.0)
MCV: 88.3 fL (ref 80.0–100.0)
Monocytes Absolute: 1.2 K/uL — ABNORMAL HIGH (ref 0.1–1.0)
Monocytes Relative: 15 %
Neutro Abs: 3.7 K/uL (ref 1.7–7.7)
Neutrophils Relative %: 50 %
Platelets: 348 K/uL (ref 150–400)
RBC: 2.98 MIL/uL — ABNORMAL LOW (ref 3.87–5.11)
RDW: 18.2 % — ABNORMAL HIGH (ref 11.5–15.5)
WBC: 7.5 K/uL (ref 4.0–10.5)
nRBC: 0 % (ref 0.0–0.2)

## 2024-06-13 LAB — COMPREHENSIVE METABOLIC PANEL WITH GFR
ALT: 11 U/L (ref 0–44)
AST: 73 U/L — ABNORMAL HIGH (ref 15–41)
Albumin: 3.1 g/dL — ABNORMAL LOW (ref 3.5–5.0)
Alkaline Phosphatase: 323 U/L — ABNORMAL HIGH (ref 38–126)
Anion gap: 9 (ref 5–15)
BUN: 11 mg/dL (ref 6–20)
CO2: 25 mmol/L (ref 22–32)
Calcium: 8.7 mg/dL — ABNORMAL LOW (ref 8.9–10.3)
Chloride: 97 mmol/L — ABNORMAL LOW (ref 98–111)
Creatinine, Ser: 1.07 mg/dL — ABNORMAL HIGH (ref 0.44–1.00)
GFR, Estimated: >60 mL/min (ref >60)
Glucose, Bld: 98 mg/dL (ref 70–99)
Potassium: 4.2 mmol/L (ref 3.5–5.1)
Sodium: 131 mmol/L — ABNORMAL LOW (ref 135–145)
Total Bilirubin: 0.4 mg/dL (ref 0.0–1.2)
Total Protein: 6.9 g/dL (ref 6.5–8.1)

## 2024-06-13 LAB — MAGNESIUM: Magnesium: 1.8 mg/dL (ref 1.7–2.4)

## 2024-06-13 MED ORDER — PROCHLORPERAZINE MALEATE 10 MG PO TABS
10.0000 mg | ORAL_TABLET | Freq: Once | ORAL | Status: AC
  Administered 2024-06-13: 10 mg via ORAL
  Filled 2024-06-13: qty 1

## 2024-06-13 MED ORDER — SODIUM CHLORIDE 0.9 % IV SOLN: INTRAVENOUS | Status: DC

## 2024-06-13 MED ORDER — SODIUM CHLORIDE 0.9 % IV SOLN
1000.0000 mg/m2 | Freq: Once | INTRAVENOUS | Status: AC
  Administered 2024-06-13: 1862 mg via INTRAVENOUS
  Filled 2024-06-13: qty 48.97

## 2024-06-13 NOTE — Progress Notes (Signed)
   06/13/24 1300  Spiritual Encounters  Type of Visit Attempt (pt unavailable)  Referral source Chaplain assessment  Reason for visit Routine spiritual support  OnCall Visit No   Reason for Visit: Chaplain making scheduled follow-up with Pt I previously connected with.  Description of Visit: I entered the room and found Kristin Carson in the chair, covered up with a blanket, and receiving her treatment. There was a support person present who let me know she was sleeping.  Plan of Care: I will continue my attempts to follow up with Kristin Carson on a bi-weekly basis.   Kristin Carson, MDiv  Chaplain, [facility] [email or web address] [phone]

## 2024-06-13 NOTE — Progress Notes (Signed)
 Patient presents today for chemotherapy Gemzar infusion.  Patient is in satisfactory condition with no new complaints voiced.  Vital signs are stable.  Labs reviewed and all labs are within treatment parameters.  We will proceed with treatment per MD orders.    Treatment given today per MD orders. Tolerated infusion without adverse affects. Vital signs stable. No complaints at this time. Discharged from clinic ambulatory in stable condition. Alert and oriented x 3. F/U with [facility] as scheduled.

## 2024-06-13 NOTE — Patient Instructions (Signed)
 [facility] - A DEPT OF [facility]  Discharge Instructions: Thank you for choosing [facility] to provide your oncology and hematology care.  If you have a lab appointment with the Cancer Center - please note that after April 8th, 2024, all labs will be drawn in the cancer center.  You do not have to check in or register with the main entrance as you have in the past but will complete your check-in in the cancer center.  Wear comfortable clothing and clothing appropriate for easy access to any Portacath or PICC line.   We strive to give you quality time with your provider. You may need to reschedule your appointment if you arrive late (15 or more minutes).  Arriving late affects you and other patients whose appointments are after yours.  Also, if you miss three or more appointments without notifying the office, you may be dismissed from the clinic at the provider's discretion.      For prescription refill requests, have your pharmacy contact our office and allow 72 hours for refills to be completed.    Today you received the following chemotherapy and/or immunotherapy agents Gemzar      To help prevent nausea and vomiting after your treatment, we encourage you to take your nausea medication as directed.  Gemcitabine Injection What is this medication? GEMCITABINE (jem SYE ta been) treats some types of cancer. It works by slowing down the growth of cancer cells. This medicine may be used for other purposes; ask your health care provider or pharmacist if you have questions. COMMON BRAND NAME(S): Gemzar, Infugem What should I tell my care team before I take this medication? They need to know if you have any of these conditions: Blood disorders Infection Kidney disease Liver disease Lung or breathing disease, such as asthma or COPD Recent or ongoing radiation therapy An unusual or allergic reaction to gemcitabine, other medications, foods, dyes,  or preservatives If you or your partner are pregnant or trying to get pregnant Breast-feeding How should I use this medication? This medication is injected into a vein. It is given by your care team in a hospital or clinic setting. Talk to your care team about the use of this medication in children. Special care may be needed. Overdosage: If you think you have taken too much of this medicine contact a poison control center or emergency room at once. NOTE: This medicine is only for you. Do not share this medicine with others. What if I miss a dose? Keep appointments for follow-up doses. It is important not to miss your dose. Call your care team if you are unable to keep an appointment. What may interact with this medication? Interactions have not been studied. This list may not describe all possible interactions. Give your health care provider a list of all the medicines, herbs, non-prescription drugs, or dietary supplements you use. Also tell them if you smoke, drink alcohol , or use illegal drugs. Some items may interact with your medicine. What should I watch for while using this medication? Your condition will be monitored carefully while you are receiving this medication. This medication may make you feel generally unwell. This is not uncommon, as chemotherapy can affect healthy cells as well as cancer cells. Report any side effects. Continue your course of treatment even though you feel ill unless your care team tells you to stop. In some cases, you may be given additional medications to help with side effects.  Follow all directions for their use. This medication may increase your risk of getting an infection. Call your care team for advice if you get a fever, chills, sore throat, or other symptoms of a cold or flu. Do not treat yourself. Try to avoid being around people who are sick. This medication may increase your risk to bruise or bleed. Call your care team if you notice any unusual  bleeding. Be careful brushing or flossing your teeth or using a toothpick because you may get an infection or bleed more easily. If you have any dental work done, tell your dentist you are receiving this medication. Avoid taking medications that contain aspirin, acetaminophen , ibuprofen , naproxen , or ketoprofen unless instructed by your care team. These medications may hide a fever. Talk to your care team if you or your partner wish to become pregnant or think you might be pregnant. This medication can cause serious birth defects if taken during pregnancy and for 6 months after the last dose. A negative pregnancy test is required before starting this medication. A reliable form of contraception is recommended while taking this medication and for 6 months after the last dose. Talk to your care team about effective forms of contraception. Do not father a child while taking this medication and for 3 months after the last dose. Use a condom while having sex during this time period. Do not breastfeed while taking this medication and for at least 1 week after the last dose. This medication may cause infertility. Talk to your care team if you are concerned about your fertility. What side effects may I notice from receiving this medication? Side effects that you should report to your care team as soon as possible: Allergic reactions--skin rash, itching, hives, swelling of the face, lips, tongue, or throat Capillary leak syndrome--stomach or muscle pain, unusual weakness or fatigue, feeling faint or lightheaded, decrease in the amount of urine, swelling of the ankles, hands, or feet, trouble breathing Infection--fever, chills, cough, sore throat, wounds that don't heal, pain or trouble when passing urine, general feeling of discomfort or being unwell Liver injury--right upper belly pain, loss of appetite, nausea, light-colored stool, dark yellow or brown urine, yellowing skin or eyes, unusual weakness or  fatigue Low red blood cell level--unusual weakness or fatigue, dizziness, headache, trouble breathing Lung injury--shortness of breath or trouble breathing, cough, spitting up blood, chest pain, fever Stomach pain, bloody diarrhea, pale skin, unusual weakness or fatigue, decrease in the amount of urine, which may be signs of hemolytic uremic syndrome Sudden and severe headache, confusion, change in vision, seizures, which may be signs of posterior reversible encephalopathy syndrome (PRES) Unusual bruising or bleeding Side effects that usually do not require medical attention (report to your care team if they continue or are bothersome): Diarrhea Drowsiness Hair loss Nausea Pain, redness, or swelling with sores inside the mouth or throat Vomiting This list may not describe all possible side effects. Call your doctor for medical advice about side effects. You may report side effects to FDA at 1-800-FDA-1088. Where should I keep my medication? This medication is given in a hospital or clinic. It will not be stored at home. NOTE: This sheet is a summary. It may not cover all possible information. If you have questions about this medicine, talk to your doctor, pharmacist, or health care provider.  2024 Elsevier/Gold Standard (2021-12-24 00:00:00)  BELOW ARE SYMPTOMS THAT SHOULD BE REPORTED IMMEDIATELY: *FEVER GREATER THAN 100.4 F (38 C) OR HIGHER *CHILLS OR SWEATING *NAUSEA AND  VOMITING THAT IS NOT CONTROLLED WITH YOUR NAUSEA MEDICATION *UNUSUAL SHORTNESS OF BREATH *UNUSUAL BRUISING OR BLEEDING *URINARY PROBLEMS (pain or burning when urinating, or frequent urination) *BOWEL PROBLEMS (unusual diarrhea, constipation, pain near the anus) TENDERNESS IN MOUTH AND THROAT WITH OR WITHOUT PRESENCE OF ULCERS (sore throat, sores in mouth, or a toothache) UNUSUAL RASH, SWELLING OR PAIN  UNUSUAL VAGINAL DISCHARGE OR ITCHING   Items with * indicate a potential emergency and should be followed up as  soon as possible or go to the Emergency Department if any problems should occur.  Please show the CHEMOTHERAPY ALERT CARD or IMMUNOTHERAPY ALERT CARD at check-in to the Emergency Department and triage nurse.  Should you have questions after your visit or need to cancel or reschedule your appointment, please contact [facility] [place] - A DEPT OF [facility] [phone]  and follow the prompts.  Office hours are 8:00 a.m. to 4:30 p.m. Monday - Friday. Please note that voicemails left after 4:00 p.m. may not be returned until the following business day.  We are closed weekends and major holidays. You have access to a nurse at all times for urgent questions. Please call the main number to the clinic [phone] and follow the prompts.  For any non-urgent questions, you may also contact your provider using MyChart. We now offer e-Visits for anyone 25 and older to request care online for non-urgent symptoms. For details visit [email or web address].   Also download the MyChart app! Go to the app store, search MyChart, open the app, select [facility], and log in with your MyChart username and password.

## 2024-06-14 DIAGNOSIS — J449 Chronic obstructive pulmonary disease, unspecified: Secondary | ICD-10-CM | POA: Diagnosis not present

## 2024-06-15 DIAGNOSIS — J449 Chronic obstructive pulmonary disease, unspecified: Secondary | ICD-10-CM | POA: Diagnosis not present

## 2024-06-16 DIAGNOSIS — J449 Chronic obstructive pulmonary disease, unspecified: Secondary | ICD-10-CM | POA: Diagnosis not present

## 2024-06-17 DIAGNOSIS — J449 Chronic obstructive pulmonary disease, unspecified: Secondary | ICD-10-CM | POA: Diagnosis not present

## 2024-06-18 DIAGNOSIS — J449 Chronic obstructive pulmonary disease, unspecified: Secondary | ICD-10-CM | POA: Diagnosis not present

## 2024-06-20 ENCOUNTER — Inpatient Hospital Stay

## 2024-06-20 ENCOUNTER — Inpatient Hospital Stay: Admitting: Oncology

## 2024-06-20 DIAGNOSIS — J449 Chronic obstructive pulmonary disease, unspecified: Secondary | ICD-10-CM | POA: Diagnosis not present

## 2024-06-21 DIAGNOSIS — J449 Chronic obstructive pulmonary disease, unspecified: Secondary | ICD-10-CM | POA: Diagnosis not present

## 2024-06-22 DIAGNOSIS — J449 Chronic obstructive pulmonary disease, unspecified: Secondary | ICD-10-CM | POA: Diagnosis not present

## 2024-06-23 DIAGNOSIS — J449 Chronic obstructive pulmonary disease, unspecified: Secondary | ICD-10-CM | POA: Diagnosis not present

## 2024-06-24 ENCOUNTER — Encounter (HOSPITAL_COMMUNITY): Payer: Self-pay

## 2024-06-24 ENCOUNTER — Other Ambulatory Visit: Payer: Self-pay

## 2024-06-24 ENCOUNTER — Emergency Department (HOSPITAL_COMMUNITY)

## 2024-06-24 ENCOUNTER — Observation Stay (HOSPITAL_COMMUNITY)

## 2024-06-24 ENCOUNTER — Inpatient Hospital Stay (HOSPITAL_COMMUNITY)
Admission: EM | Admit: 2024-06-24 | Discharge: 2024-07-05 | DRG: 947 | Discharge status: Against Medical Advice | Attending: Family Medicine | Admitting: Family Medicine

## 2024-06-24 DIAGNOSIS — E8809 Other disorders of plasma-protein metabolism, not elsewhere classified: Secondary | ICD-10-CM | POA: Diagnosis present

## 2024-06-24 DIAGNOSIS — J90 Pleural effusion, not elsewhere classified: Secondary | ICD-10-CM | POA: Diagnosis not present

## 2024-06-24 DIAGNOSIS — Z8249 Family history of ischemic heart disease and other diseases of the circulatory system: Secondary | ICD-10-CM

## 2024-06-24 DIAGNOSIS — I509 Heart failure, unspecified: Secondary | ICD-10-CM | POA: Diagnosis not present

## 2024-06-24 DIAGNOSIS — R918 Other nonspecific abnormal finding of lung field: Secondary | ICD-10-CM | POA: Diagnosis present

## 2024-06-24 DIAGNOSIS — I272 Pulmonary hypertension, unspecified: Secondary | ICD-10-CM | POA: Diagnosis present

## 2024-06-24 DIAGNOSIS — G893 Neoplasm related pain (acute) (chronic): Principal | ICD-10-CM | POA: Diagnosis present

## 2024-06-24 DIAGNOSIS — D72829 Elevated white blood cell count, unspecified: Secondary | ICD-10-CM | POA: Diagnosis present

## 2024-06-24 DIAGNOSIS — Z885 Allergy status to narcotic agent status: Secondary | ICD-10-CM

## 2024-06-24 DIAGNOSIS — E875 Hyperkalemia: Secondary | ICD-10-CM | POA: Diagnosis not present

## 2024-06-24 DIAGNOSIS — E440 Moderate protein-calorie malnutrition: Secondary | ICD-10-CM | POA: Diagnosis present

## 2024-06-24 DIAGNOSIS — Z7189 Other specified counseling: Secondary | ICD-10-CM

## 2024-06-24 DIAGNOSIS — R079 Chest pain, unspecified: Secondary | ICD-10-CM | POA: Diagnosis not present

## 2024-06-24 DIAGNOSIS — M799 Soft tissue disorder, unspecified: Secondary | ICD-10-CM | POA: Diagnosis not present

## 2024-06-24 DIAGNOSIS — R0781 Pleurodynia: Secondary | ICD-10-CM

## 2024-06-24 DIAGNOSIS — Z7951 Long term (current) use of inhaled steroids: Secondary | ICD-10-CM

## 2024-06-24 DIAGNOSIS — J9622 Acute and chronic respiratory failure with hypercapnia: Secondary | ICD-10-CM | POA: Diagnosis present

## 2024-06-24 DIAGNOSIS — F419 Anxiety disorder, unspecified: Secondary | ICD-10-CM | POA: Diagnosis present

## 2024-06-24 DIAGNOSIS — R748 Abnormal levels of other serum enzymes: Secondary | ICD-10-CM | POA: Diagnosis present

## 2024-06-24 DIAGNOSIS — J9621 Acute and chronic respiratory failure with hypoxia: Secondary | ICD-10-CM | POA: Diagnosis present

## 2024-06-24 DIAGNOSIS — N179 Acute kidney failure, unspecified: Secondary | ICD-10-CM | POA: Diagnosis not present

## 2024-06-24 DIAGNOSIS — Z95828 Presence of other vascular implants and grafts: Secondary | ICD-10-CM

## 2024-06-24 DIAGNOSIS — Z6830 Body mass index (BMI) 30.0-30.9, adult: Secondary | ICD-10-CM

## 2024-06-24 DIAGNOSIS — C3490 Malignant neoplasm of unspecified part of unspecified bronchus or lung: Secondary | ICD-10-CM | POA: Diagnosis not present

## 2024-06-24 DIAGNOSIS — C539 Malignant neoplasm of cervix uteri, unspecified: Secondary | ICD-10-CM | POA: Diagnosis not present

## 2024-06-24 DIAGNOSIS — I110 Hypertensive heart disease with heart failure: Secondary | ICD-10-CM | POA: Diagnosis present

## 2024-06-24 DIAGNOSIS — J449 Chronic obstructive pulmonary disease, unspecified: Secondary | ICD-10-CM | POA: Diagnosis not present

## 2024-06-24 DIAGNOSIS — T451X5A Adverse effect of antineoplastic and immunosuppressive drugs, initial encounter: Secondary | ICD-10-CM | POA: Diagnosis present

## 2024-06-24 DIAGNOSIS — Z888 Allergy status to other drugs, medicaments and biological substances status: Secondary | ICD-10-CM

## 2024-06-24 DIAGNOSIS — C7802 Secondary malignant neoplasm of left lung: Principal | ICD-10-CM | POA: Diagnosis present

## 2024-06-24 DIAGNOSIS — Z881 Allergy status to other antibiotic agents status: Secondary | ICD-10-CM

## 2024-06-24 DIAGNOSIS — I10 Essential (primary) hypertension: Secondary | ICD-10-CM | POA: Diagnosis present

## 2024-06-24 DIAGNOSIS — I202 Refractory angina pectoris: Secondary | ICD-10-CM | POA: Diagnosis not present

## 2024-06-24 DIAGNOSIS — C786 Secondary malignant neoplasm of retroperitoneum and peritoneum: Secondary | ICD-10-CM | POA: Diagnosis present

## 2024-06-24 DIAGNOSIS — R0602 Shortness of breath: Secondary | ICD-10-CM

## 2024-06-24 DIAGNOSIS — J439 Emphysema, unspecified: Secondary | ICD-10-CM | POA: Diagnosis present

## 2024-06-24 DIAGNOSIS — C782 Secondary malignant neoplasm of pleura: Secondary | ICD-10-CM | POA: Diagnosis present

## 2024-06-24 DIAGNOSIS — J4489 Other specified chronic obstructive pulmonary disease: Secondary | ICD-10-CM | POA: Diagnosis present

## 2024-06-24 DIAGNOSIS — D500 Iron deficiency anemia secondary to blood loss (chronic): Secondary | ICD-10-CM | POA: Diagnosis present

## 2024-06-24 DIAGNOSIS — K219 Gastro-esophageal reflux disease without esophagitis: Secondary | ICD-10-CM | POA: Diagnosis present

## 2024-06-24 DIAGNOSIS — Z743 Need for continuous supervision: Secondary | ICD-10-CM | POA: Diagnosis not present

## 2024-06-24 DIAGNOSIS — J910 Malignant pleural effusion: Secondary | ICD-10-CM | POA: Diagnosis present

## 2024-06-24 DIAGNOSIS — E871 Hypo-osmolality and hyponatremia: Secondary | ICD-10-CM | POA: Diagnosis not present

## 2024-06-24 DIAGNOSIS — C787 Secondary malignant neoplasm of liver and intrahepatic bile duct: Secondary | ICD-10-CM | POA: Diagnosis present

## 2024-06-24 DIAGNOSIS — I2510 Atherosclerotic heart disease of native coronary artery without angina pectoris: Secondary | ICD-10-CM | POA: Diagnosis present

## 2024-06-24 DIAGNOSIS — G928 Other toxic encephalopathy: Secondary | ICD-10-CM | POA: Diagnosis present

## 2024-06-24 DIAGNOSIS — I499 Cardiac arrhythmia, unspecified: Secondary | ICD-10-CM | POA: Diagnosis not present

## 2024-06-24 DIAGNOSIS — G620 Drug-induced polyneuropathy: Secondary | ICD-10-CM | POA: Diagnosis present

## 2024-06-24 DIAGNOSIS — R0789 Other chest pain: Secondary | ICD-10-CM | POA: Diagnosis not present

## 2024-06-24 DIAGNOSIS — I5033 Acute on chronic diastolic (congestive) heart failure: Secondary | ICD-10-CM | POA: Diagnosis present

## 2024-06-24 DIAGNOSIS — Z515 Encounter for palliative care: Secondary | ICD-10-CM

## 2024-06-24 DIAGNOSIS — C7800 Secondary malignant neoplasm of unspecified lung: Secondary | ICD-10-CM | POA: Diagnosis present

## 2024-06-24 DIAGNOSIS — Z720 Tobacco use: Secondary | ICD-10-CM | POA: Diagnosis present

## 2024-06-24 DIAGNOSIS — C799 Secondary malignant neoplasm of unspecified site: Secondary | ICD-10-CM | POA: Diagnosis not present

## 2024-06-24 DIAGNOSIS — Z5329 Procedure and treatment not carried out because of patient's decision for other reasons: Secondary | ICD-10-CM | POA: Diagnosis present

## 2024-06-24 DIAGNOSIS — F17210 Nicotine dependence, cigarettes, uncomplicated: Secondary | ICD-10-CM | POA: Diagnosis present

## 2024-06-24 DIAGNOSIS — J939 Pneumothorax, unspecified: Secondary | ICD-10-CM | POA: Diagnosis not present

## 2024-06-24 DIAGNOSIS — E46 Unspecified protein-calorie malnutrition: Secondary | ICD-10-CM | POA: Diagnosis present

## 2024-06-24 LAB — COMPREHENSIVE METABOLIC PANEL WITH GFR
ALT: 9 U/L (ref 0–44)
AST: 56 U/L — ABNORMAL HIGH (ref 15–41)
Albumin: 3.1 g/dL — ABNORMAL LOW (ref 3.5–5.0)
Alkaline Phosphatase: 306 U/L — ABNORMAL HIGH (ref 38–126)
Anion gap: 13 (ref 5–15)
BUN: 11 mg/dL (ref 6–20)
CO2: 24 mmol/L (ref 22–32)
Calcium: 8.8 mg/dL — ABNORMAL LOW (ref 8.9–10.3)
Chloride: 98 mmol/L (ref 98–111)
Creatinine, Ser: 0.91 mg/dL (ref 0.44–1.00)
GFR, Estimated: >60 mL/min (ref >60)
Glucose, Bld: 100 mg/dL — ABNORMAL HIGH (ref 70–99)
Potassium: 3.6 mmol/L (ref 3.5–5.1)
Sodium: 135 mmol/L (ref 135–145)
Total Bilirubin: 0.6 mg/dL (ref 0.0–1.2)
Total Protein: 6.8 g/dL (ref 6.5–8.1)

## 2024-06-24 LAB — CBC WITH DIFFERENTIAL/PLATELET
Abs Immature Granulocytes: 0.11 K/uL — ABNORMAL HIGH (ref 0.00–0.07)
Basophils Absolute: 0 K/uL (ref 0.0–0.1)
Basophils Relative: 0 %
Eosinophils Absolute: 0 K/uL (ref 0.0–0.5)
Eosinophils Relative: 0 %
HCT: 24.3 % — ABNORMAL LOW (ref 36.0–46.0)
Hemoglobin: 8 g/dL — ABNORMAL LOW (ref 12.0–15.0)
Immature Granulocytes: 1 %
Lymphocytes Relative: 19 %
Lymphs Abs: 2 K/uL (ref 0.7–4.0)
MCH: 27.9 pg (ref 26.0–34.0)
MCHC: 32.9 g/dL (ref 30.0–36.0)
MCV: 84.7 fL (ref 80.0–100.0)
Monocytes Absolute: 1.9 K/uL — ABNORMAL HIGH (ref 0.1–1.0)
Monocytes Relative: 18 %
Neutro Abs: 6.4 K/uL (ref 1.7–7.7)
Neutrophils Relative %: 62 %
Platelets: 142 K/uL — ABNORMAL LOW (ref 150–400)
RBC: 2.87 MIL/uL — ABNORMAL LOW (ref 3.87–5.11)
RDW: 18.6 % — ABNORMAL HIGH (ref 11.5–15.5)
WBC: 10.4 K/uL (ref 4.0–10.5)
nRBC: 0.2 % (ref 0.0–0.2)

## 2024-06-24 LAB — IRON AND TIBC
Iron: 15 ug/dL — ABNORMAL LOW (ref 28–170)
Saturation Ratios: 7 % — ABNORMAL LOW (ref 10.4–31.8)
TIBC: 204 ug/dL — ABNORMAL LOW (ref 250–450)
UIBC: 190 ug/dL

## 2024-06-24 LAB — PROTIME-INR
INR: 1 (ref 0.8–1.2)
Prothrombin Time: 14.3 s (ref 11.4–15.2)

## 2024-06-24 LAB — PHOSPHORUS: Phosphorus: 2.6 mg/dL (ref 2.5–4.6)

## 2024-06-24 LAB — TROPONIN T, HIGH SENSITIVITY
Troponin T High Sensitivity: 31 ng/L — ABNORMAL HIGH (ref 0–19)
Troponin T High Sensitivity: 27 ng/L — ABNORMAL HIGH (ref 0–19)

## 2024-06-24 LAB — PRO BRAIN NATRIURETIC PEPTIDE: Pro Brain Natriuretic Peptide: 723 pg/mL — ABNORMAL HIGH (ref <300.0)

## 2024-06-24 LAB — MAGNESIUM: Magnesium: 1.7 mg/dL (ref 1.7–2.4)

## 2024-06-24 MED ORDER — OXYCODONE HCL ER 15 MG PO T12A
15.0000 mg | EXTENDED_RELEASE_TABLET | Freq: Two times a day (BID) | ORAL | Status: DC
Start: 2024-06-24 — End: 2024-06-26
  Administered 2024-06-24 – 2024-06-25 (×4): 15 mg via ORAL
  Filled 2024-06-24 (×5): qty 1

## 2024-06-24 MED ORDER — AMLODIPINE BESYLATE 5 MG PO TABS
10.0000 mg | ORAL_TABLET | Freq: Every day | ORAL | Status: DC
Start: 2024-06-24 — End: 2024-06-25
  Administered 2024-06-24 – 2024-06-25 (×2): 10 mg via ORAL
  Filled 2024-06-24 (×2): qty 2

## 2024-06-24 MED ORDER — ONDANSETRON HCL 4 MG PO TABS
4.0000 mg | ORAL_TABLET | Freq: Four times a day (QID) | ORAL | Status: DC | PRN
  Administered 2024-06-25 – 2024-07-02 (×3): 4 mg via ORAL
  Filled 2024-06-24 (×3): qty 1

## 2024-06-24 MED ORDER — IOHEXOL 350 MG/ML SOLN
75.0000 mL | Freq: Once | INTRAVENOUS | Status: AC | PRN
  Administered 2024-06-24: 75 mL via INTRAVENOUS

## 2024-06-24 MED ORDER — TRAZODONE HCL 50 MG PO TABS
25.0000 mg | ORAL_TABLET | Freq: Every evening | ORAL | Status: DC | PRN
  Administered 2024-06-25 – 2024-07-04 (×4): 25 mg via ORAL
  Filled 2024-06-24 (×4): qty 1

## 2024-06-24 MED ORDER — SENNOSIDES-DOCUSATE SODIUM 8.6-50 MG PO TABS
1.0000 | ORAL_TABLET | Freq: Two times a day (BID) | ORAL | Status: DC
  Administered 2024-06-24 – 2024-07-04 (×18): 1 via ORAL
  Filled 2024-06-24 (×21): qty 1

## 2024-06-24 MED ORDER — SODIUM CHLORIDE 0.9% FLUSH
3.0000 mL | Freq: Two times a day (BID) | INTRAVENOUS | Status: DC
  Administered 2024-06-24 – 2024-07-04 (×13): 3 mL via INTRAVENOUS

## 2024-06-24 MED ORDER — OXYCODONE HCL 5 MG PO TABS
5.0000 mg | ORAL_TABLET | ORAL | Status: DC | PRN
  Administered 2024-06-25 – 2024-07-05 (×15): 5 mg via ORAL
  Filled 2024-06-24 (×16): qty 1

## 2024-06-24 MED ORDER — HEPARIN SODIUM (PORCINE) 5000 UNIT/ML IJ SOLN
5000.0000 [IU] | Freq: Three times a day (TID) | INTRAMUSCULAR | Status: DC
  Administered 2024-06-24 – 2024-07-04 (×26): 5000 [IU] via SUBCUTANEOUS
  Filled 2024-06-24 (×29): qty 1

## 2024-06-24 MED ORDER — SODIUM CHLORIDE 0.9 % IV SOLN: INTRAVENOUS | Status: DC

## 2024-06-24 MED ORDER — ACETAMINOPHEN 650 MG RE SUPP: 650.0000 mg | Freq: Four times a day (QID) | RECTAL | Status: DC | PRN

## 2024-06-24 MED ORDER — PANTOPRAZOLE SODIUM 40 MG PO TBEC
40.0000 mg | DELAYED_RELEASE_TABLET | Freq: Every day | ORAL | Status: DC
  Administered 2024-06-24 – 2024-07-04 (×10): 40 mg via ORAL
  Filled 2024-06-24 (×10): qty 1

## 2024-06-24 MED ORDER — FUROSEMIDE 10 MG/ML IJ SOLN
40.0000 mg | Freq: Once | INTRAMUSCULAR | Status: AC
  Administered 2024-06-24: 40 mg via INTRAVENOUS
  Filled 2024-06-24: qty 4

## 2024-06-24 MED ORDER — ONDANSETRON HCL 4 MG/2ML IJ SOLN
4.0000 mg | Freq: Four times a day (QID) | INTRAMUSCULAR | Status: DC | PRN
  Administered 2024-06-24 – 2024-07-01 (×3): 4 mg via INTRAVENOUS
  Filled 2024-06-24 (×3): qty 2

## 2024-06-24 MED ORDER — ALUM & MAG HYDROXIDE-SIMETH 200-200-20 MG/5ML PO SUSP
15.0000 mL | ORAL | Status: DC | PRN
  Administered 2024-06-24 – 2024-06-26 (×3): 15 mL via ORAL
  Filled 2024-06-24 (×3): qty 30

## 2024-06-24 MED ORDER — HYDROMORPHONE HCL 1 MG/ML IJ SOLN
1.0000 mg | Freq: Once | INTRAMUSCULAR | Status: AC
  Administered 2024-06-24: 1 mg via INTRAVENOUS
  Filled 2024-06-24: qty 1

## 2024-06-24 MED ORDER — SENNOSIDES-DOCUSATE SODIUM 8.6-50 MG PO TABS: 1.0000 | ORAL_TABLET | Freq: Every evening | ORAL | Status: DC | PRN

## 2024-06-24 MED ORDER — BISACODYL 5 MG PO TBEC: 5.0000 mg | DELAYED_RELEASE_TABLET | Freq: Every day | ORAL | Status: DC | PRN

## 2024-06-24 MED ORDER — HYDROMORPHONE HCL 1 MG/ML IJ SOLN
0.5000 mg | INTRAMUSCULAR | Status: DC | PRN
  Administered 2024-06-24 – 2024-06-25 (×2): 1 mg via INTRAVENOUS
  Filled 2024-06-24 (×2): qty 1

## 2024-06-24 MED ORDER — ACETAMINOPHEN 325 MG PO TABS
650.0000 mg | ORAL_TABLET | Freq: Four times a day (QID) | ORAL | Status: DC | PRN
  Administered 2024-06-24 – 2024-06-30 (×3): 650 mg via ORAL
  Filled 2024-06-24 (×3): qty 2

## 2024-06-24 MED ORDER — HYDRALAZINE HCL 20 MG/ML IJ SOLN: 10.0000 mg | INTRAMUSCULAR | Status: DC | PRN

## 2024-06-24 MED ORDER — IPRATROPIUM BROMIDE 0.02 % IN SOLN
0.5000 mg | Freq: Four times a day (QID) | RESPIRATORY_TRACT | Status: DC | PRN
  Administered 2024-06-25 – 2024-07-03 (×6): 0.5 mg via RESPIRATORY_TRACT
  Filled 2024-06-24 (×7): qty 2.5

## 2024-06-24 MED ORDER — SODIUM CHLORIDE 0.9 % IV BOLUS
1000.0000 mL | Freq: Once | INTRAVENOUS | Status: AC
  Administered 2024-06-24: 1000 mL via INTRAVENOUS

## 2024-06-24 MED ORDER — PREGABALIN 75 MG PO CAPS
200.0000 mg | ORAL_CAPSULE | Freq: Three times a day (TID) | ORAL | Status: DC
  Administered 2024-06-24 – 2024-06-25 (×6): 200 mg via ORAL
  Filled 2024-06-24 (×7): qty 1

## 2024-06-24 MED ORDER — SODIUM CHLORIDE 0.9% FLUSH
3.0000 mL | Freq: Two times a day (BID) | INTRAVENOUS | Status: DC
  Administered 2024-06-24 – 2024-07-04 (×12): 3 mL via INTRAVENOUS

## 2024-06-24 MED ORDER — PANTOPRAZOLE SODIUM 20 MG PO TBEC: 20.0000 mg | DELAYED_RELEASE_TABLET | Freq: Every day | ORAL | Status: DC

## 2024-06-24 MED ORDER — FLEET ENEMA RE ENEM: 1.0000 | ENEMA | Freq: Once | RECTAL | Status: DC | PRN

## 2024-06-24 MED ORDER — METOCLOPRAMIDE HCL 5 MG/ML IJ SOLN: 10.0000 mg | Freq: Four times a day (QID) | INTRAMUSCULAR | Status: DC | PRN

## 2024-06-24 MED ORDER — MAGNESIUM OXIDE -MG SUPPLEMENT 400 (240 MG) MG PO TABS
400.0000 mg | ORAL_TABLET | Freq: Three times a day (TID) | ORAL | Status: DC
  Administered 2024-06-24 – 2024-07-04 (×28): 400 mg via ORAL
  Filled 2024-06-24 (×31): qty 1

## 2024-06-24 MED ORDER — FENTANYL CITRATE (PF) 100 MCG/2ML IJ SOLN
50.0000 ug | Freq: Once | INTRAMUSCULAR | Status: AC
  Administered 2024-06-24: 50 ug via INTRAVENOUS
  Filled 2024-06-24: qty 2

## 2024-06-24 MED ORDER — ONDANSETRON HCL 4 MG/2ML IJ SOLN
4.0000 mg | Freq: Once | INTRAMUSCULAR | Status: AC
  Administered 2024-06-24: 4 mg via INTRAVENOUS
  Filled 2024-06-24: qty 2

## 2024-06-24 MED ORDER — HYDROXYZINE HCL 10 MG PO TABS
10.0000 mg | ORAL_TABLET | Freq: Three times a day (TID) | ORAL | Status: DC | PRN
  Administered 2024-06-24 – 2024-07-03 (×6): 10 mg via ORAL
  Filled 2024-06-24 (×8): qty 1

## 2024-06-24 MED ORDER — DIPHENHYDRAMINE HCL 50 MG/ML IJ SOLN: 12.5000 mg | Freq: Four times a day (QID) | INTRAMUSCULAR | Status: DC | PRN

## 2024-06-24 NOTE — ED Provider Notes (Signed)
 [place] EMERGENCY DEPARTMENT AT [facility]  Provider Note  CSN: [id number] Arrival date & time: 06/24/24 9674  History Chief Complaint  Patient presents with  . Shortness of Breath    Kristin Carson is a 53 y.o. female with history of cervical cancer with mets to peritoneum and lungs recently had an endobronchial washing with path consistent with mets. She reports three days of increasing weakness, SOB, cough occasionally productive and pleuritic L sided chest/back pain. EMS reports normal SpO2 but sinus tachycardia. No reported fevers.    Home Medications Prior to Admission medications   Medication Sig Start Date End Date Taking? Authorizing Provider  albuterol  (VENTOLIN  HFA) 108 (90 Base) MCG/ACT inhaler Inhale 2 puffs into the lungs every 6 (six) hours as needed for wheezing or shortness of breath. 05/25/24   Isadora Hose, MD  amLODipine  (NORVASC ) 10 MG tablet Take 1 tablet (10 mg total) by mouth daily. 05/30/24   Kandala, Hyndavi, MD  budesonide -formoterol  (SYMBICORT ) 160-4.5 MCG/ACT inhaler Inhale 2 puffs into the lungs in the morning and at bedtime. 05/25/24   Isadora Hose, MD  lidocaine -prilocaine  (EMLA ) cream Apply a quarter-sized amount to port a cath site and cover with plastic wrap 1 hour prior to infusion appointments Patient not taking: Reported on 06/07/2024 02/08/24   [name], MD  magnesium  oxide (MAG-OX) 400 (240 Mg) MG tablet Take 1 tablet (400 mg total) by mouth 3 (three) times daily. 03/24/24   [name], MD  megestrol  (MEGACE ) 400 MG/10ML suspension Take 10 mLs (400 mg total) by mouth 2 (two) times daily. Patient not taking: Reported on 06/07/2024 02/08/24   [name], MD  oxyCODONE  (OXY IR/ROXICODONE ) 5 MG immediate release tablet Take 1 tablet (5 mg total) by mouth every 6 (six) hours as needed for severe pain (pain score 7-10). 05/27/24   Geofm Delon BRAVO, NP  prednisoLONE  acetate (PRED FORTE ) 1 % ophthalmic suspension Place  1 drop into both eyes 2 (two) times daily. Patient not taking: Reported on 06/07/2024 01/18/24   [provider]  pregabalin  (LYRICA ) 200 MG capsule Take 1 capsule (200 mg total) by mouth 3 (three) times daily. 05/27/24   Geofm Delon BRAVO, NP  prochlorperazine  (COMPAZINE ) 10 MG tablet Take 1 tablet (10 mg total) by mouth every 6 (six) hours as needed. Patient not taking: Reported on 06/07/2024 05/30/24   Davonna Siad, MD  Tiotropium Bromide Monohydrate  (SPIRIVA  RESPIMAT) 2.5 MCG/ACT AERS Inhale 2 puffs into the lungs daily. Patient not taking: Reported on 06/07/2024 05/25/24   Isadora Hose, MD     Allergies    Carrot [daucus carota], Lisinopril -hydrochlorothiazide , Ibuprofen , Other, Tramadol , and Erythromycin   Review of Systems   Review of Systems Please see HPI for pertinent positives and negatives  Physical Exam BP 132/83   Pulse (!) 121   Temp 98.2 F (36.8 C)   Resp (!) 23   Ht 5' 1 (1.549 m)   Wt 74.2 kg   LMP 08/15/2022 (Approximate)   SpO2 93%   BMI 30.91 kg/m   Physical Exam Vitals and nursing note reviewed.  Constitutional:      Appearance: Normal appearance.  HENT:     Head: Normocephalic and atraumatic.     Nose: Nose normal.     Mouth/Throat:     Mouth: Mucous membranes are moist.  Eyes:     Extraocular Movements: Extraocular movements intact.     Conjunctiva/sclera: Conjunctivae normal.  Cardiovascular:     Rate and Rhythm: Tachycardia present.  Pulmonary:     Effort: Pulmonary effort is normal.     Breath sounds: Rhonchi and rales present.  Abdominal:     General: Abdomen is flat.     Palpations: Abdomen is soft.     Tenderness: There is no abdominal tenderness.  Musculoskeletal:        General: No swelling. Normal range of motion.     Cervical back: Neck supple.  Skin:    General: Skin is warm and dry.  Neurological:     General: No focal deficit present.     Mental Status: She is alert.  Psychiatric:        Mood and Affect: Mood  normal.     ED Results / Procedures / Treatments   EKG None  Procedures Procedures  Medications Ordered in the ED Medications  HYDROmorphone  (DILAUDID ) injection 1 mg (has no administration in time range)  fentaNYL  (SUBLIMAZE ) injection 50 mcg (50 mcg Intravenous Given 06/24/24 0352)  ondansetron  (ZOFRAN ) injection 4 mg (4 mg Intravenous Given 06/24/24 0352)  sodium chloride  0.9 % bolus 1,000 mL (0 mLs Intravenous Stopped 06/24/24 0518)  iohexol  (OMNIPAQUE ) 350 MG/ML injection 75 mL (75 mLs Intravenous Contrast Given 06/24/24 0452)    Initial Impression and Plan  Patient here with SOB, pleuritic pain and tachycardia. Not hypoxic. Given cancer history consider PNA, effusion or PE. Will check labs, CXR and send for PE study. Pain/nausea meds and IVF for comfort.   ED Course   Clinical Course as of 06/24/24 9285  Fri Jun 24, 2024  0410 I personally viewed the images from radiology studies and agree with radiologist interpretation: CXR shows mets, but no acute process.  [CS]  0429 CBC with normal WBC, anemia is about at baseline.  [CS]  0453 CMP is unremarkable. Initial Trop and BNP both mildly elevated without priors for comparison.  [CS]  0618 Delta trop not significantly changed.  [CS]  [phone] I personally viewed the images from radiology studies and agree with radiologist interpretation: CTA neg for PE or PNA, but does show progressive metastatic disease and a new loculated effusion. Patient still in considerable pain. Discussed results with her including implications of progressive disease despite chemo treatments. Will plan admission for pain control, consideration of thoracentesis and palliative care consult.  [CS]  9285 Spoke withy Dr. Twana, Hospitalist, who will evaluate the patient for admission.  [CS]    Clinical Course User Index [CS] Roselyn Carlin NOVAK, MD     MDM Rules/Calculators/A&P Medical Decision Making Problems Addressed: Malignant neoplasm metastatic to  left lung [facility]): chronic illness or injury with exacerbation, progression, or side effects of treatment Pleural effusion: acute illness or injury Pleuritic chest pain: acute illness or injury  Amount and/or Complexity of Data Reviewed Labs: ordered. Decision-making details documented in ED Course. Radiology: ordered and independent interpretation performed. Decision-making details documented in ED Course. ECG/medicine tests: ordered and independent interpretation performed. Decision-making details documented in ED Course.  Risk Prescription drug management. Parenteral controlled substances. Decision regarding hospitalization.     Final Clinical Impression(s) / ED Diagnoses Final diagnoses:  Malignant neoplasm metastatic to left lung [facility])  Pleuritic chest pain  Pleural effusion    Rx / DC Orders ED Discharge Orders     None        Roselyn Carlin NOVAK, MD 06/24/24 [phone]

## 2024-06-24 NOTE — Assessment & Plan Note (Addendum)
 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   Pending palliative care consultation, will need follow up with oncology.   Acute toxic metabolic encephalopathy, resolved.  Continue pain control with oxycodone  IR and acetaminophen .  Hold on non steroidal antiinflammatory agents due to reduced GFR. Hold on IV hydromorphone , long acting oxycodone  and pregabalin  to avoid encephalopathy.  Very poor prognosis.

## 2024-06-24 NOTE — Progress Notes (Signed)
 Limited US  of the left chest shows a small volume loculated pleural effusion - potential procedure window in the inferior portion is obscured by lung flap and is therefore inaccessible. The most superior portion of the fluid collection is slightly larger but also inaccessible for thoracentesis due to the scapula. No pleural fluid identified in the right chest.  No procedure performed.   Images are available for review under imaging section of Epic.  Please call with questions or concerns.   Kristin Carson

## 2024-06-24 NOTE — Evaluation (Signed)
 Physical Therapy Evaluation Patient Details Name: Kristin Carson MRN: [id number] DOB: 1971/07/29 Today's Date: 06/24/2024  History of Present Illness  Kristin Carson is a 53 y.o. female with history of cervical cancer with mets to peritoneum and lungs recently had an endobronchial washing with path consistent with mets. She reports three days of increasing weakness, SOB, cough occasionally productive and pleuritic L sided chest/back pain. EMS reports normal SpO2 but sinus tachycardia. No reported fevers.   Clinical Impression  Pt. Tolerated session well, pt. Was able to do bed mobility, transfer, ambulation independently, during activities pt. Felt slightly fatigued and out of breath towards the end, Pt. SpO2 decreased to 88% with ambulation on room air.Plan:  Patient discharged from physical therapy to care of nursing for ambulation daily as tolerated for length of stay.          If plan is discharge home, recommend the following:     Can travel by private vehicle    Yes    Equipment Recommendations None recommended by PT  Recommendations for Other Services       Functional Status Assessment Patient has had a recent decline in their functional status and demonstrates the ability to make significant improvements in function in a reasonable and predictable amount of time.     Precautions / Restrictions Precautions Precautions: Fall Recall of Precautions/Restrictions: Intact Restrictions Weight Bearing Restrictions Per Provider Order: No      Mobility  Bed Mobility Overal bed mobility: Independent             General bed mobility comments: WNL    Transfers Overall transfer level: Independent Equipment used: None               General transfer comment: WNL    Ambulation/Gait Ambulation/Gait assistance: Independent Gait Distance (Feet): 100 Feet Assistive device: None Gait Pattern/deviations: WFL(Within Functional Limits)       General Gait Details: Pt.  ambulated independently, towards the end pt, felt fatigued and out of breath. Gait speed normal  Stairs            Wheelchair Mobility     Tilt Bed    Modified Rankin (Stroke Patients Only)       Balance Overall balance assessment: Independent                                           Pertinent Vitals/Pain Pain Assessment Pain Assessment: Faces Faces Pain Scale: Hurts a little bit Pain Location: L flank pain Pain Descriptors / Indicators: Discomfort Pain Intervention(s): Monitored during session    Home Living Family/patient expects to be discharged to:: Private residence Living Arrangements: Spouse/significant other Available Help at Discharge: Family;Available 24 hours/day;Other (Comment) Type of Home: House Home Access: Level entry       Home Layout: One level Home Equipment: Pharmacist, hospital (2 wheels) Additional Comments: Pt reports that a nurse is present 3pm to 5pm each day other than sunday to assist her as needed.    Prior Function Prior Level of Function : Needs assist       Physical Assist : ADLs (physical)   ADLs (physical): IADLs;Bathing;Dressing;Toileting Mobility Comments: Independent ambulation without AD in the community. Does not drive. ADLs Comments: PRN assist from nurse for ADL's; assisted IADL's by nurse and spouse.     Extremity/Trunk Assessment   Upper Extremity Assessment Upper Extremity Assessment: Defer to  OT evaluation    Lower Extremity Assessment Lower Extremity Assessment: Overall WFL for tasks assessed    Cervical / Trunk Assessment Cervical / Trunk Assessment: Normal  Communication   Communication Communication: No apparent difficulties    Cognition Arousal: Alert Behavior During Therapy: WFL for tasks assessed/performed   PT - Cognitive impairments: No apparent impairments                         Following commands: Intact       Cueing Cueing Techniques:  Verbal cues     General Comments General comments (skin integrity, edema, etc.): Pt required one standing rest break while ambulating in the hall. Mild labored breathing noted.    Exercises     Assessment/Plan    PT Assessment Patient does not need any further PT services  PT Problem List         PT Treatment Interventions      PT Goals (Current goals can be found in the Care Plan section)  Acute Rehab PT Goals Patient Stated Goal: pt. wants to return home. PT Goal Formulation: With patient Time For Goal Achievement: 06/24/24 Potential to Achieve Goals: Good    Frequency       Co-evaluation PT/OT/SLP Co-Evaluation/Treatment: Yes Reason for Co-Treatment: To address functional/ADL transfers PT goals addressed during session: Mobility/safety with mobility OT goals addressed during session: ADL's and self-care       AM-PAC PT 6 Clicks Mobility  Outcome Measure Help needed turning from your back to your side while in a flat bed without using bedrails?: None Help needed moving from lying on your back to sitting on the side of a flat bed without using bedrails?: None Help needed moving to and from a bed to a chair (including a wheelchair)?: None Help needed standing up from a chair using your arms (e.g., wheelchair or bedside chair)?: None Help needed to walk in hospital room?: None Help needed climbing 3-5 steps with a railing? : A Little 6 Click Score: 23    End of Session   Activity Tolerance: Patient tolerated treatment well;Patient limited by fatigue Patient left: in chair;with family/visitor present Nurse Communication: Mobility status PT Visit Diagnosis: Muscle weakness (generalized) (M62.81);History of falling (Z91.81)    Time: 9198-9178 PT Time Calculation (min) (ACUTE ONLY): 20 min   Charges:   PT Evaluation $PT Eval Moderate Complexity: 1 Mod PT Treatments $Gait Training: 8-22 mins PT General Charges $$ ACUTE PT VISIT: 1 Visit        Jenipher Havel, SPT

## 2024-06-24 NOTE — Assessment & Plan Note (Addendum)
 Cell count stable with hgb 7.7  Iron panel with serum iron 15. TIBC 204 and transferrin saturation of 7.  Will order IV iron infusion.

## 2024-06-24 NOTE — ED Triage Notes (Signed)
 [facility] from home Weak 3 days + SOB Nausea + CP tonight 118hr 148/88  100% room air  Has not ate since Wednesday  Stage 4 lung cancer

## 2024-06-24 NOTE — Assessment & Plan Note (Addendum)
 Continue pantoprazole .

## 2024-06-24 NOTE — Assessment & Plan Note (Deleted)
-  Likely due to metastasis -Monitoring closely     Latest Ref Rng & Units 06/24/2024    3:55 AM 06/13/2024   11:24 AM 05/30/2024    8:04 AM  Hepatic Function  Total Protein 6.5 - 8.1 g/dL 6.8  6.9  7.0   Albumin 3.5 - 5.0 g/dL 3.1  3.1  2.8   AST 15 - 41 U/L 56  73  38   ALT 0 - 44 U/L 9  11  13    Alk Phosphatase 38 - 126 U/L 306  323  264   Total Bilirubin 0.0 - 1.2 mg/dL 0.6  0.4  0.7    - Avoiding hepatotoxins

## 2024-06-24 NOTE — Progress Notes (Signed)
   06/24/24 1030  TOC Brief Assessment  Insurance and Status Reviewed  Patient has primary care physician Yes  Home environment has been reviewed Home with Spouse  Prior level of function: Independent  Prior/Current Home Services No current home services  Social Drivers of Health Review SDOH reviewed no interventions necessary  Readmission risk has been reviewed Yes  Transition of care needs no transition of care needs at this time   Inpatient Care Manager (ICM) has reviewed patient and no ICM needs have been identified at this time. We will continue to monitor patient advancement through interdisciplinary progression rounds. If new patient transition needs arise, please place a ICM consult.

## 2024-06-24 NOTE — Assessment & Plan Note (Addendum)
 Nicotine  patch.

## 2024-06-24 NOTE — Assessment & Plan Note (Addendum)
 Hold amlodipine  due to risk of hypotension.

## 2024-06-24 NOTE — Assessment & Plan Note (Deleted)
-   [place] related to new metastases in the chest, loculated pleural effusion with possible pneumonectomy metastases  CT angiogram reviewed: Negative for PE, progression of pulmonary metastases, multiple pleural based nodules, loculated left pleural effusion, bilateral liver metastases, pulmonary hypertension.  Emphysema.  CAD  -Will obtain IR consultation for possible thoracentesis -Continue optimizing pain management

## 2024-06-24 NOTE — Assessment & Plan Note (Deleted)
 Continue Lyrica.

## 2024-06-24 NOTE — Assessment & Plan Note (Deleted)
 As above

## 2024-06-24 NOTE — Plan of Care (Signed)
   Problem: Education: Goal: Knowledge of General Education information will improve Description Including pain rating scale, medication(s)/side effects and non-pharmacologic comfort measures Outcome: Progressing   Problem: Health Behavior/Discharge Planning: Goal: Ability to manage health-related needs will improve Outcome: Progressing

## 2024-06-24 NOTE — Hospital Course (Addendum)
 Mrs. Sypher was admitted to the hospital with the working diagnosis of chest pain    36 Y female with history of metastatic cervical cancer to liver, peritoneum, lungs status post recent endobronchial washing pathology consistent with metastasis, HTN, smoking, and peripheral neuropathy, who presented with chest pain. Reports pain has been increasingly getting worse over the past 3 days prior to admission, considerable dyspnea with occasional cough.  Pain radiates to the left side and back. EMS was called and she was transported to the ED.  On her initial physical examination blood pressure 122/98, HR 116, RR 17 and 02 saturation 93% Lungs with no wheezing or rhonchi, heart with S1 and S2 present and regular, abdomen with no distention, soft and non tender, no lower extremity edema.   CTA chest: Negative for PE, progression of pulmonary metastases, multiple pleural based nodules, loculated left pleural effusion, bilateral liver metastases, pulmonary hypertension.  Emphysema.  CAD.   IR consulted, small volume loculated pleural effusion on the left. Not able to access through thoracentesis.  Consulted palliative care.  10/26 acute disorientation, code stroke called, negative CT head. Recovered well. Stroke ruled out.

## 2024-06-24 NOTE — Assessment & Plan Note (Deleted)
-   Closely, repleting accordingly

## 2024-06-24 NOTE — Evaluation (Signed)
 Occupational Therapy Evaluation Patient Details Name: Kristin Carson MRN: [id number] DOB: 08-Nov-1970 Today's Date: 06/24/2024   History of Present Illness   Kristin Carson is a 53 y.o. female with history of cervical cancer with mets to peritoneum and lungs recently had an endobronchial washing with path consistent with mets. She reports three days of increasing weakness, SOB, cough occasionally productive and pleuritic L sided chest/back pain. EMS reports normal SpO2 but sinus tachycardia. No reported fevers. (per MD)     Clinical Impressions Pt agreeable to OT and PT co-evaluation. Pt appears to be at or near baseline function for ADL's and functional mobility. No physical assist needed today. Some labored breathing and one rest break needed during ambulation in the hall without AD. Pt left in the chair with call bell within reach. Pt is not recommended for any further acute OT services and will be discharged to care of nursing staff for remaining length of stay.       If plan is discharge home, recommend the following:   Assist for transportation     Functional Status Assessment   Patient has not had a recent decline in their functional status     Equipment Recommendations   None recommended by OT             Precautions/Restrictions   Precautions Precautions: Fall Recall of Precautions/Restrictions: Intact Restrictions Weight Bearing Restrictions Per Provider Order: No     Mobility Bed Mobility Overal bed mobility: Independent                  Transfers Overall transfer level: Independent                        Balance Overall balance assessment: Independent                                         ADL either performed or assessed with clinical judgement   ADL Overall ADL's : Independent                                       General ADL Comments: Pt demonstrated independence with lower body dressing  and ability to functionall y transfer.     Vision Baseline Vision/History: 1 Wears glasses Ability to See in Adequate Light: 0 Adequate Patient Visual Report: No change from baseline Vision Assessment?: No apparent visual deficits     Perception Perception: Not tested       Praxis Praxis: Not tested       Pertinent Vitals/Pain Pain Assessment Pain Assessment: Faces Faces Pain Scale: Hurts a little bit Pain Location: L flank pain Pain Descriptors / Indicators: Discomfort Pain Intervention(s): Limited activity within patient's tolerance, Monitored during session, Repositioned     Extremity/Trunk Assessment Upper Extremity Assessment Upper Extremity Assessment: Generalized weakness;Overall [facility] for tasks assessed   Lower Extremity Assessment Lower Extremity Assessment: Defer to PT evaluation   Cervical / Trunk Assessment Cervical / Trunk Assessment: Normal   Communication Communication Communication: No apparent difficulties   Cognition Arousal: Alert Behavior During Therapy: WFL for tasks assessed/performed Cognition: No apparent impairments  Following commands: Intact       Cueing  General Comments   Cueing Techniques: Verbal cues  Pt required one standing rest break while ambulating in the hall. Mild labored breathing noted.              Home Living Family/patient expects to be discharged to:: Private residence Living Arrangements: Spouse/significant other Available Help at Discharge: Family;Available 24 hours/day;Other (Comment) (nurse) Type of Home: House Home Access: Level entry     Home Layout: One level     Bathroom Shower/Tub: Chief Strategy Officer: Standard Bathroom Accessibility: Yes How Accessible: Accessible via walker Home Equipment: Shower seat;Rolling Walker (2 wheels)   Additional Comments: Pt reports that a nurse is present 3pm to 5pm each day other than sunday to assist  her as needed.      Prior Functioning/Environment Prior Level of Function : Needs assist       Physical Assist : ADLs (physical)   ADLs (physical): IADLs;Bathing;Dressing;Toileting Mobility Comments: Independent ambulation without AD in the community. Does not drive. ADLs Comments: PRN assist from nurse for ADL's; assisted IADL's by nurse and spouse.                            Co-evaluation PT/OT/SLP Co-Evaluation/Treatment: Yes Reason for Co-Treatment: To address functional/ADL transfers   OT goals addressed during session: ADL's and self-care      AM-PAC OT 6 Clicks Daily Activity     Outcome Measure Help from another person eating meals?: None Help from another person taking care of personal grooming?: None Help from another person toileting, which includes using toliet, bedpan, or urinal?: None Help from another person bathing (including washing, rinsing, drying)?: None Help from another person to put on and taking off regular upper body clothing?: None Help from another person to put on and taking off regular lower body clothing?: None 6 Click Score: 24   End of Session    Activity Tolerance: Patient tolerated treatment well Patient left: in chair;with call bell/phone within reach;with family/visitor present  OT Visit Diagnosis: Unsteadiness on feet (R26.81);Other abnormalities of gait and mobility (R26.89);Muscle weakness (generalized) (M62.81)                Time: 9186-9176 OT Time Calculation (min): 10 min Charges:  OT General Charges $OT Visit: 1 Visit OT Evaluation $OT Eval Low Complexity: 1 Low  Dalary Hollar OT, MOT  Jayson Person 06/24/2024, 9:56 AM

## 2024-06-24 NOTE — H&P (Signed)
 History and Physical   Patient: Kristin Carson                            PCP: [name], FNP                    DOB: April 27, 1971            DOA: 06/24/2024 FMW:[id number]             DOS: 06/24/2024, 12:27 PM  [name], FNP  Patient coming from:   HOME  I have personally reviewed patient's medical records, in electronic medical records, including:  [place] link, and care everywhere.    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 Presenting with progressive chest pain.  Reports pain has been increasingly getting worse over the past 3 days, considerable shortness of breath, with occasional cough.  Pain radiates to the left side and back. No report of diaphoresis, fever, chills nausea or vomiting.   ED Evaluation: Blood pressure (!) 122/98, pulse (!) 116, temperature 98.2 F (36.8 C), resp. rate 17, height 5' 1 (1.549 m), weight 74.2 kg, last menstrual period 08/15/2022, SpO2 93%.  Labs: CBC WBC 10.4, hemoglobin 8.0, BMP within normal limits with calcium  of 8.8, glucose 100  CTA chest: Negative for PE, progression of pulmonary metastases, multiple pleural based nodules, loculated left pleural effusion, bilateral liver metastases, pulmonary hypertension.  Emphysema.  CAD.  Patient to be admitted for pain management, possible pleural effusion, palliative consultation, oncology consultation to determine path, goals of care     Patient Denies having: Fever, Chills, Cough, SOB, Abd pain, N/V/D, headache, dizziness, lightheadedness,  Dysuria, Joint pain, rash, open wounds    Review of Systems: As per HPI, otherwise 10 point review of systems were negative.    ----------------------------------------------------------------------------------------------------------------------  Allergies  Allergen Reactions   Carrot [Daucus Carota] Hives   Lisinopril -Hydrochlorothiazide      Oral swelling   Ibuprofen  Other (See Comments)    Oral swelling   Other Itching and Other (See Comments)    Hair dye, blisters, pus-filled, soreness   Tramadol  Hives   Erythromycin Hives    Home MEDs:  Prior to Admission medications   Medication Sig Start Date End Date Taking? Authorizing Provider  albuterol  (VENTOLIN  HFA) 108 (90 Base) MCG/ACT inhaler Inhale 2 puffs into the lungs every 6 (six) hours as needed for wheezing or shortness of breath. 05/25/24   Isadora Hose, MD  amLODipine  (NORVASC ) 10 MG tablet Take 1 tablet (10 mg total) by mouth daily. 05/30/24   Kandala, Hyndavi, MD  budesonide -formoterol  (SYMBICORT ) 160-4.5 MCG/ACT inhaler Inhale 2 puffs into the lungs in the morning and at bedtime. 05/25/24   Isadora Hose, MD  magnesium  oxide (MAG-OX) 400 (240 Mg) MG tablet Take 1 tablet (400 mg total) by mouth 3 (three) times daily. 03/24/24   [name], MD  oxyCODONE  (OXY IR/ROXICODONE ) 5 MG immediate release tablet Take 1 tablet (5 mg total) by mouth every 6 (six) hours as needed for severe pain (pain score 7-10). 05/27/24   Geofm Delon BRAVO, NP  prednisoLONE  acetate (PRED FORTE ) 1 % ophthalmic suspension Place 1 drop into both eyes 2 (  two) times daily. Patient not taking: Reported on 06/07/2024 01/18/24   [provider]  pregabalin  (LYRICA ) 200 MG capsule Take 1 capsule (200 mg total) by mouth 3 (three) times daily. 05/27/24   Geofm Delon BRAVO, NP  prochlorperazine  (COMPAZINE ) 10 MG tablet Take 1 tablet (10 mg total) by mouth every 6 (six) hours as needed. Patient not taking: Reported on 06/07/2024 05/30/24   Davonna Siad, MD  Tiotropium Bromide Monohydrate  (SPIRIVA  RESPIMAT) 2.5 MCG/ACT AERS Inhale 2 puffs into the lungs daily. Patient not  taking: Reported on 06/07/2024 05/25/24   Isadora Hose, MD    PRN MEDs: acetaminophen  **OR** acetaminophen , bisacodyl, hydrALAZINE , HYDROmorphone  (DILAUDID ) injection, ipratropium, ondansetron  **OR** ondansetron  (ZOFRAN ) IV, oxyCODONE , sodium phosphate , traZODone  Past Medical History:  Diagnosis Date   Anemia    Asthma    Bronchitis    Cancer (HCC)    Cervical   CHF (congestive heart failure) (HCC)    COPD (chronic obstructive pulmonary disease) (HCC)    Depression    Dyspnea    GERD (gastroesophageal reflux disease)    Headache    Hypertension    Port-A-Cath in place 09/25/2022    Past Surgical History:  Procedure Laterality Date   BREAST SURGERY     CESAREAN SECTION     IR IMAGING GUIDED PORT INSERTION  09/30/2022   LEG SURGERY     VIDEO BRONCHOSCOPY WITH ENDOBRONCHIAL NAVIGATION Bilateral 06/07/2024   Procedure: VIDEO BRONCHOSCOPY WITH ENDOBRONCHIAL NAVIGATION;  Surgeon: Isadora Hose, MD;  Location: [facility] ORS;  Service: Pulmonary;  Laterality: Bilateral;     reports that she has been smoking cigarettes. She started smoking about 4 weeks ago. She has been smoking an average 0.5 packs per day for the past 0.1 years. She has never used smokeless tobacco. She reports current alcohol  use. She reports current drug use. Drug: Marijuana.   Family History  Problem Relation Age of Onset   Dermatomyositis Mother    Hypertension Mother    Cancer Mother        lung   Cancer Father        lung   Heart disease Father    Lung cancer Paternal Uncle    Lung cancer Maternal Uncle    Colon cancer Neg Hx    Breast cancer Neg Hx    Ovarian cancer Neg Hx    Endometrial cancer Neg Hx    Pancreatic cancer Neg Hx    Prostate cancer Neg Hx     Physical Exam:   Vitals:   06/24/24 1000 06/24/24 1015 06/24/24 1115 06/24/24 1145  BP: 106/66  (!) 115/94 102/70  Pulse: (!) 106 (!) 103 (!) 113 (!) 102  Resp: (!) 36 18 (!) 25 (!) 25  Temp:   98.1 F (36.7 C)   TempSrc:   Oral   SpO2:  91% 94% 94% 97%  Weight:      Height:       Constitutional: NAD, calm, comfortable Eyes: PERRL, lids and conjunctivae normal ENMT: Mucous membranes are moist. Posterior pharynx clear of any exudate or lesions.Normal dentition.  Neck: normal, supple, no masses, no thyromegaly Respiratory: clear to auscultation bilaterally, no wheezing, no crackles. Normal respiratory effort. No accessory muscle use.  Cardiovascular: Regular rate and rhythm, no murmurs / rubs / gallops. No extremity edema. 2+ pedal pulses. No carotid bruits.  Abdomen: no tenderness, no masses palpated. No hepatosplenomegaly. Bowel sounds positive.  Musculoskeletal: no clubbing / cyanosis. No joint deformity upper and lower extremities. Good ROM, no  contractures. Normal muscle tone.  Neurologic: CN II-XII grossly intact. Sensation intact, DTR normal. Strength 5/5 in all 4.  Psychiatric: Normal judgment and insight. Alert and oriented x 3. Normal mood.  Skin: no rashes, lesions, ulcers. No induration    Labs on admission:    I have personally reviewed following labs and imaging studies  CBC: Recent Labs  Lab 06/24/24 0355  WBC 10.4  NEUTROABS 6.4  HGB 8.0*  HCT 24.3*  MCV 84.7  PLT 142*   Basic Metabolic Panel: Recent Labs  Lab 06/24/24 0355 06/24/24 0550  NA 135  --   K 3.6  --   CL 98  --   CO2 24  --   GLUCOSE 100*  --   BUN 11  --   CREATININE 0.91  --   CALCIUM  8.8*  --   MG  --  1.7  PHOS  --  2.6   GFR: Estimated Creatinine Clearance: 67.4 mL/min (by C-G formula based on SCr of 0.91 mg/dL). Liver Function Tests: Recent Labs  Lab 06/24/24 0355  AST 56*  ALT 9  ALKPHOS 306*  BILITOT 0.6  PROT 6.8  ALBUMIN 3.1*   No results for input(s): LIPASE, AMYLASE in the last 168 hours. No results for input(s): AMMONIA in the last 168 hours. Coagulation Profile: Recent Labs  Lab 06/24/24 0550  INR 1.0   Cardiac Enzymes: No results for input(s): CKTOTAL, CKMB, CKMBINDEX,  TROPONINI in the last 168 hours. BNP (last 3 results) Recent Labs    06/24/24 0355  PROBNP 723.0*   HbA1C: No results for input(s): HGBA1C in the last 72 hours. CBG: No results for input(s): GLUCAP in the last 168 hours. Lipid Profile: No results for input(s): CHOL, HDL, LDLCALC, TRIG, CHOLHDL, LDLDIRECT in the last 72 hours. Thyroid  Function Tests: No results for input(s): TSH, T4TOTAL, FREET4, T3FREE, THYROIDAB in the last 72 hours. Anemia Panel: Recent Labs    06/24/24 0550  TIBC 204*  IRON 15*   Urine analysis:    Component Value Date/Time   COLORURINE AMBER (A) 02/06/2024 1540   APPEARANCEUR CLOUDY (A) 02/06/2024 1540   LABSPEC 1.024 02/06/2024 1540   PHURINE 5.0 02/06/2024 1540   GLUCOSEU NEGATIVE 02/06/2024 1540   HGBUR NEGATIVE 02/06/2024 1540   BILIRUBINUR NEGATIVE 02/06/2024 1540   KETONESUR NEGATIVE 02/06/2024 1540   PROTEINUR 100 (A) 02/06/2024 1540   UROBILINOGEN 0.2 02/11/2013 2027   NITRITE NEGATIVE 02/06/2024 1540   LEUKOCYTESUR NEGATIVE 02/06/2024 1540    Last A1C:  Lab Results  Component Value Date   HGBA1C 5.3 08/29/2022     Radiologic Exams on Admission:   US  CHEST (PLEURAL EFFUSION) Result Date: 06/24/2024 CLINICAL DATA:  Patient with history of cervical cancer with metastases to the peritoneum and lungs, recent endobronchial washing pathology consistent with metastatic disease. Choose currently admitted with dyspnea and CTA shows new loculated left pleural effusion. Request for possible left thoracentesis EXAM: BILATERAL CHEST ULTRASOUND COMPARISON:  CTA chest 06/24/2024, chest x-ray 1 view 06/24/2024 FINDINGS: Limited ultrasound of the left chest shows a small volume loculated pleural effusion, potential procedure window in the inferior portion is obscured by lung flap. The most superior portion of the pleural effusion is slightly larger however in accessible due to the scapula. Limited ultrasound of the right chest  shows no pleural fluid. IMPRESSION: Limited ultrasound of the left chest shows a small volume loculated pleural effusion which is not accessible for safe thoracentesis. No procedure performed. Electronically Signed   By: CHRISTELLA.  Shick M.D.   On: 06/24/2024 09:23   CT Angio Chest PE W/Cm &/Or Wo Cm Result Date: 06/24/2024 EXAM: CTA of the Chest with contrast for PE 06/24/2024 05:07:25 AM TECHNIQUE: CTA of the chest was performed after the administration of intravenous contrast. Multiplanar reformatted images are provided for review. MIP images are provided for review. Automated exposure control, iterative reconstruction, and/or weight based adjustment of the mA/kV was utilized to reduce the radiation dose to as low as reasonably achievable. COMPARISON: 06/06/2024 CLINICAL HISTORY: Pulmonary embolism (PE) suspected, high prob; CP, SOB, metastatic cervical cancer. [facility] from home; Weak 3 days + SOB; Nausea + CP tonight; Stage 4 lung cancer FINDINGS: PULMONARY ARTERIES: Pulmonary arteries are adequately opacified for evaluation. No pulmonary embolism. The main pulmonary artery measures 3.4 cm. MEDIASTINUM: The heart is at the upper limits of normal in size. Coronary artery calcifications are present. Trace pericardial effusion. Prominent mediastinum. There is no acute abnormality of the thoracic aorta. LYMPH NODES: A left hilar lymph node is identified. No mediastinal or axillary lymphadenopathy. LUNGS AND PLEURA: Interval progression since the previous exam. There has been interval development of a loculated left pleural effusion overlying the left lung with components extending over the left apex. Signs of pulmonary metastases with multiple pleural-based nodules and masses in both lungs. Index lesion within the lingula is again identified measuring 4.1 x 3.1 cm (image 68/8), previously 4.1 x 3.0 cm. Index lesion within the lateral right middle lobe measures 3.2 x 1.7 cm (image 67/14), previously 2.7 x 1.7 cm. Lesion  within the superior segment of the left lower lobe measures 2.8 x 2.4 cm (image 37/14), previously 2.3 x 2.5 cm. Mild emphysema with diffuse bronchial wall thickening. UPPER ABDOMEN: Bilateral liver metastases are again noted. Dominant lesion within the posteromedial right hepatic lobe measures 7.7 x 5.8 cm (image 109/8), previously 7.2 x 4.9 cm. Lesion within the medial segment of the left lobe of liver measures 6.7 x 6.4 cm (image 110/8), previously 5.8 x 5.5 cm. SOFT TISSUES AND BONES: No acute bone or soft tissue abnormality. IMPRESSION: 1. No evidence of pulmonary embolism. 2. Interval progression of pulmonary metastases with multiple pleural-based nodules and masses in both lungs. 3. Interval development of a loculated left pleural effusion. Concerning for new malignant pleural effusion. 4. Bilateral liver metastases with interval progression. 5. Increased caliber of the main pulmonary artery suggestive of pulmonary artery hypertension. 6. Emphysema and coronary artery calcifications. Electronically signed by: Waddell Calk MD 06/24/2024 06:30 AM EDT RP Workstation: [id number]   DG Chest Port 1 View Result Date: 06/24/2024 CLINICAL DATA:  Shortness of breath, chest pain EXAM: PORTABLE CHEST 1 VIEW COMPARISON:  06/07/2024 FINDINGS: Cardiac shadow is stable. Right chest wall port is again seen. Soft tissue density along the right lateral chest wall is noted consistent with the known history of metastatic disease better visualized on the current exam. The superior segment left lower lobe mass and lateral left basilar mass are noted as well. No new focal abnormality is noted. IMPRESSION: Multiple pulmonary metastatic lesions.  No acute abnormality noted. Electronically Signed   By: Oneil Devonshire M.D.   On: 06/24/2024 04:01    EKG:   Independently reviewed.  Orders placed or performed during the hospital encounter of 06/24/24   ED EKG   ED EKG   EKG 12-Lead    ---------------------------------------------------------------------------------------------------------------------------------------    Assessment / Plan:   Principal Problem:   Chest pain Active Problems:   Essential hypertension   Cervical cancer (HCC)  Port-A-Cath in place   Iron deficiency anemia due to chronic blood loss   GERD (gastroesophageal reflux disease)   Peripheral neuropathy due to chemotherapy   Hypoalbuminemia due to protein-calorie malnutrition   Goals of care, counseling/discussion   Elevated liver enzymes   Tobacco use   Lung mass   Assessment and Plan: * Chest pain - [place] related to new metastases in the chest, loculated pleural effusion with possible pneumonectomy metastases  CT angiogram reviewed: Negative for PE, progression of pulmonary metastases, multiple pleural based nodules, loculated left pleural effusion, bilateral liver metastases, pulmonary hypertension.  Emphysema.  CAD  -Will obtain IR consultation for possible thoracentesis -Continue optimizing pain management  Lung mass As above  Tobacco use Discussed with patient, NicoDerm patch provided  Elevated liver enzymes -Likely due to metastasis -Monitoring closely     Latest Ref Rng & Units 06/24/2024    3:55 AM 06/13/2024   11:24 AM 05/30/2024    8:04 AM  Hepatic Function  Total Protein 6.5 - 8.1 g/dL 6.8  6.9  7.0   Albumin 3.5 - 5.0 g/dL 3.1  3.1  2.8   AST 15 - 41 U/L 56  73  38   ALT 0 - 44 U/L 9  11  13    Alk Phosphatase 38 - 126 U/L 306  323  264   Total Bilirubin 0.0 - 1.2 mg/dL 0.6  0.4  0.7    - Avoiding hepatotoxins  Goals of care, counseling/discussion - Place of care consulted -Cervical cancer with extensive metastases to liver, peritoneum, lungs with no new nodules and pleural effusion -Full code -Continue discussion regarding goals of care, CODE STATUS Appreciate oncology and palliative care consultation and follow-up  Hypoalbuminemia due to  protein-calorie malnutrition - Closely, repleting accordingly  Peripheral neuropathy due to chemotherapy Continue Lyrica   GERD (gastroesophageal reflux disease) Continue PPI  Iron deficiency anemia due to chronic blood loss - H&H stable, monitoring -Not on any iron supplements -Rechecking iron stores  Port-A-Cath in place Will access as needed  Cervical cancer (HCC) - S/p treatment chemotherapy 3 cycles, -S/p evaluation by pulmonology, bronchoscopy bronchial wash -lesion, cytology consistent with metastatic cancer  - Metastatic cervical carcinoma -  to liver peritoneum, and lungs with a new nodule of pleural effusion  CTA reviewed:  Progression of pulmonary metastases, multiple pleural based nodules, loculated left pleural effusion, bilateral liver metastases, pulmonary hypertension.   -Consulting oncology for further evaluation and recommendations -Consulting palliative care team   Essential hypertension - Currently stable, continue home medication, reviewing Norvasc , as needed hydralazine      Consults called: TOC/palliative care/oncology -------------------------------------------------------------------------------------------------------------------------------------------- DVT prophylaxis:  heparin  injection 5,000 Units Start: 06/24/24 0730 SCDs Start: 06/24/24 0715   Code Status:   Code Status: Full Code   Admission status: Patient will be admitted as Observation, with a greater than 2 midnight length of stay. Level of care: Telemetry   Family Communication:  none at bedside  (The above findings and plan of care has been discussed with patient in detail, the patient expressed understanding and agreement of above plan)  --------------------------------------------------------------------------------------------------------------------------------------------------  Disposition Plan:  Anticipated 1-2 days Status is: Observation The patient remains OBS  appropriate and will d/c before 2 midnights.     ----------------------------------------------------------------------------------------------------------------------------------------------------  Time spent:  46  Min.  Was spent seeing and evaluating the patient, reviewing all medical records, drawn plan of care.  SIGNED: Adriana DELENA Grams, MD, FHM. FAAFP. [place], Pager  (Please use [email or web address] to page/ or  secure chat through epic) If 7PM-7AM, please contact night-coverage [email or web address],  06/24/2024, 12:27 PM

## 2024-06-24 NOTE — Assessment & Plan Note (Addendum)
 Follow up with palliative care.

## 2024-06-24 NOTE — Assessment & Plan Note (Deleted)
 Will access as needed

## 2024-06-25 DIAGNOSIS — I5033 Acute on chronic diastolic (congestive) heart failure: Secondary | ICD-10-CM | POA: Diagnosis not present

## 2024-06-25 DIAGNOSIS — C7800 Secondary malignant neoplasm of unspecified lung: Secondary | ICD-10-CM | POA: Diagnosis not present

## 2024-06-25 DIAGNOSIS — N179 Acute kidney failure, unspecified: Secondary | ICD-10-CM | POA: Diagnosis not present

## 2024-06-25 DIAGNOSIS — C3490 Malignant neoplasm of unspecified part of unspecified bronchus or lung: Secondary | ICD-10-CM | POA: Diagnosis not present

## 2024-06-25 DIAGNOSIS — J439 Emphysema, unspecified: Secondary | ICD-10-CM | POA: Diagnosis present

## 2024-06-25 DIAGNOSIS — Z515 Encounter for palliative care: Secondary | ICD-10-CM | POA: Diagnosis not present

## 2024-06-25 DIAGNOSIS — G928 Other toxic encephalopathy: Secondary | ICD-10-CM | POA: Diagnosis not present

## 2024-06-25 DIAGNOSIS — E8809 Other disorders of plasma-protein metabolism, not elsewhere classified: Secondary | ICD-10-CM | POA: Diagnosis not present

## 2024-06-25 DIAGNOSIS — I10 Essential (primary) hypertension: Secondary | ICD-10-CM | POA: Diagnosis not present

## 2024-06-25 DIAGNOSIS — C539 Malignant neoplasm of cervix uteri, unspecified: Secondary | ICD-10-CM | POA: Diagnosis not present

## 2024-06-25 DIAGNOSIS — K219 Gastro-esophageal reflux disease without esophagitis: Secondary | ICD-10-CM | POA: Diagnosis not present

## 2024-06-25 DIAGNOSIS — C538 Malignant neoplasm of overlapping sites of cervix uteri: Secondary | ICD-10-CM

## 2024-06-25 DIAGNOSIS — J9621 Acute and chronic respiratory failure with hypoxia: Secondary | ICD-10-CM | POA: Diagnosis not present

## 2024-06-25 DIAGNOSIS — E440 Moderate protein-calorie malnutrition: Secondary | ICD-10-CM | POA: Diagnosis not present

## 2024-06-25 DIAGNOSIS — C7802 Secondary malignant neoplasm of left lung: Secondary | ICD-10-CM | POA: Diagnosis not present

## 2024-06-25 DIAGNOSIS — R0602 Shortness of breath: Secondary | ICD-10-CM | POA: Diagnosis not present

## 2024-06-25 DIAGNOSIS — J4489 Other specified chronic obstructive pulmonary disease: Secondary | ICD-10-CM | POA: Diagnosis present

## 2024-06-25 DIAGNOSIS — Z720 Tobacco use: Secondary | ICD-10-CM | POA: Diagnosis not present

## 2024-06-25 DIAGNOSIS — E875 Hyperkalemia: Secondary | ICD-10-CM | POA: Diagnosis not present

## 2024-06-25 DIAGNOSIS — J9622 Acute and chronic respiratory failure with hypercapnia: Secondary | ICD-10-CM | POA: Diagnosis not present

## 2024-06-25 DIAGNOSIS — E871 Hypo-osmolality and hyponatremia: Secondary | ICD-10-CM | POA: Diagnosis not present

## 2024-06-25 DIAGNOSIS — C787 Secondary malignant neoplasm of liver and intrahepatic bile duct: Secondary | ICD-10-CM | POA: Diagnosis not present

## 2024-06-25 DIAGNOSIS — Z7189 Other specified counseling: Secondary | ICD-10-CM | POA: Diagnosis not present

## 2024-06-25 DIAGNOSIS — M799 Soft tissue disorder, unspecified: Secondary | ICD-10-CM | POA: Diagnosis not present

## 2024-06-25 DIAGNOSIS — J9602 Acute respiratory failure with hypercapnia: Secondary | ICD-10-CM | POA: Diagnosis not present

## 2024-06-25 DIAGNOSIS — C799 Secondary malignant neoplasm of unspecified site: Secondary | ICD-10-CM | POA: Diagnosis not present

## 2024-06-25 DIAGNOSIS — J910 Malignant pleural effusion: Secondary | ICD-10-CM | POA: Diagnosis not present

## 2024-06-25 DIAGNOSIS — D500 Iron deficiency anemia secondary to blood loss (chronic): Secondary | ICD-10-CM | POA: Diagnosis not present

## 2024-06-25 DIAGNOSIS — F17210 Nicotine dependence, cigarettes, uncomplicated: Secondary | ICD-10-CM | POA: Diagnosis present

## 2024-06-25 DIAGNOSIS — C786 Secondary malignant neoplasm of retroperitoneum and peritoneum: Secondary | ICD-10-CM | POA: Diagnosis not present

## 2024-06-25 DIAGNOSIS — Z6830 Body mass index (BMI) 30.0-30.9, adult: Secondary | ICD-10-CM | POA: Diagnosis not present

## 2024-06-25 DIAGNOSIS — I272 Pulmonary hypertension, unspecified: Secondary | ICD-10-CM | POA: Diagnosis present

## 2024-06-25 DIAGNOSIS — C782 Secondary malignant neoplasm of pleura: Secondary | ICD-10-CM | POA: Diagnosis not present

## 2024-06-25 DIAGNOSIS — E46 Unspecified protein-calorie malnutrition: Secondary | ICD-10-CM

## 2024-06-25 DIAGNOSIS — R0789 Other chest pain: Secondary | ICD-10-CM | POA: Diagnosis not present

## 2024-06-25 DIAGNOSIS — I110 Hypertensive heart disease with heart failure: Secondary | ICD-10-CM | POA: Diagnosis not present

## 2024-06-25 DIAGNOSIS — R079 Chest pain, unspecified: Secondary | ICD-10-CM | POA: Diagnosis not present

## 2024-06-25 DIAGNOSIS — C7801 Secondary malignant neoplasm of right lung: Secondary | ICD-10-CM | POA: Diagnosis not present

## 2024-06-25 DIAGNOSIS — J90 Pleural effusion, not elsewhere classified: Secondary | ICD-10-CM | POA: Diagnosis not present

## 2024-06-25 DIAGNOSIS — G893 Neoplasm related pain (acute) (chronic): Secondary | ICD-10-CM | POA: Diagnosis present

## 2024-06-25 DIAGNOSIS — R0781 Pleurodynia: Secondary | ICD-10-CM | POA: Diagnosis not present

## 2024-06-25 DIAGNOSIS — J9601 Acute respiratory failure with hypoxia: Secondary | ICD-10-CM | POA: Diagnosis not present

## 2024-06-25 DIAGNOSIS — Z5111 Encounter for antineoplastic chemotherapy: Secondary | ICD-10-CM | POA: Diagnosis not present

## 2024-06-25 LAB — BASIC METABOLIC PANEL WITH GFR
Anion gap: 11 (ref 5–15)
BUN: 10 mg/dL (ref 6–20)
CO2: 26 mmol/L (ref 22–32)
Calcium: 8.4 mg/dL — ABNORMAL LOW (ref 8.9–10.3)
Chloride: 97 mmol/L — ABNORMAL LOW (ref 98–111)
Creatinine, Ser: 1.19 mg/dL — ABNORMAL HIGH (ref 0.44–1.00)
GFR, Estimated: 55 mL/min — ABNORMAL LOW (ref >60)
Glucose, Bld: 90 mg/dL (ref 70–99)
Potassium: 4.4 mmol/L (ref 3.5–5.1)
Sodium: 133 mmol/L — ABNORMAL LOW (ref 135–145)

## 2024-06-25 LAB — GLUCOSE, CAPILLARY: Glucose-Capillary: 86 mg/dL (ref 70–99)

## 2024-06-25 LAB — CBC
HCT: 24.9 % — ABNORMAL LOW (ref 36.0–46.0)
Hemoglobin: 7.9 g/dL — ABNORMAL LOW (ref 12.0–15.0)
MCH: 27.6 pg (ref 26.0–34.0)
MCHC: 31.7 g/dL (ref 30.0–36.0)
MCV: 87.1 fL (ref 80.0–100.0)
Platelets: 169 K/uL (ref 150–400)
RBC: 2.86 MIL/uL — ABNORMAL LOW (ref 3.87–5.11)
RDW: 18.9 % — ABNORMAL HIGH (ref 11.5–15.5)
WBC: 10.5 K/uL (ref 4.0–10.5)
nRBC: 0.2 % (ref 0.0–0.2)

## 2024-06-25 LAB — APTT: aPTT: 32 s (ref 24–36)

## 2024-06-25 MED ORDER — LACTATED RINGERS IV BOLUS
500.0000 mL | Freq: Once | INTRAVENOUS | Status: AC
  Administered 2024-06-25: 500 mL via INTRAVENOUS

## 2024-06-25 NOTE — Progress Notes (Signed)
 [facility]                                                                               Kristin Carson, is a 53 y.o. 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 Presenting with progressive chest pain.  Reports pain has been increasingly getting worse over the past 3 days, considerable shortness of breath, with occasional cough.  Pain radiates to the left side and back.  CTA chest: Negative for PE, progression of pulmonary metastases, multiple pleural based nodules, loculated left pleural effusion, bilateral liver metastases, pulmonary hypertension.  Emphysema.  CAD.  Patient to be admitted for pain management, possible pleural effusion, palliative consultation, oncology consultation to determine path, goals of care.     Assessment & Plan    Assessment and Plan: * Chest pain - [place] related to new metastases in the chest, loculated pleural effusion with possible pneumonectomy metastases  CT angiogram reviewed: Negative for PE, progression of pulmonary metastases, multiple pleural based nodules, loculated left pleural effusion, bilateral liver metastases, pulmonary hypertension.  Emphysema.  CAD  IR consulted for thoracentesis.  Pain control with oxycodone  and IV dilaudid   Lung mass As above  Tobacco use  NicoDerm patch provided  Elevated liver enzymes Probably secondary to metastasis   Goals of care, counseling/discussion - Place of care consulted -Cervical cancer with extensive metastases to liver, peritoneum, .  -Full code -Continue discussion regarding goals of care, CODE STATUS Palliative care consulted for goals of  care  Hypoalbuminemia due to protein-calorie malnutrition - Closely, repleting accordingly  Peripheral neuropathy due to chemotherapy Continue Lyrica   GERD (gastroesophageal reflux disease) Continue PPI  Iron deficiency anemia due to chronic blood loss Anemia panel shows low iron will probably need IV iron before discharge Hemoglobin around 7.9, transfuse to keep hemoglobin greater than 7.  Port-A-Cath in place Will access as needed  Cervical cancer [facility]) - S/p treatment chemotherapy 3 cycles, -S/p evaluation by pulmonology, bronchoscopy bronchial wash -lesion, cytology consistent with metastatic cancer  - Metastatic cervical carcinoma -  to liver peritoneum, and lungs with a new nodule of pleural effusion  CTA reviewed:  Progression of pulmonary metastases, multiple pleural based nodules, loculated left pleural effusion, bilateral liver metastases, pulmonary hypertension.   -Consulting oncology for further evaluation and recommendations -Consulting palliative care team   Essential hypertension Blood pressure parameters appear to be optimal continue to monitor     Estimated body mass index is 30.12 kg/m as calculated from the following:   Height as of this encounter: 5' 1 (1.549 m).   Weight as of this encounter: 72.3 kg.  Code Status: full code.  DVT Prophylaxis:  heparin  injection 5,000 Units Start: 06/24/24 0730 SCDs Start: 06/24/24 0715   Level of Care: Level of care: Telemetry Family Communication: Updated patient's family at bedside.   Disposition Plan:  Remains inpatient appropriate:  pending clinical improvement.   Procedures:  None.   Consultants:   Oncology Palliative care  Antimicrobials:   Anti-infectives (From admission, onward)    None        Medications  Scheduled Meds:  heparin   5,000 Units Subcutaneous Q8H   magnesium  oxide  400 mg Oral TID   oxyCODONE   15 mg Oral Q12H   pantoprazole   40 mg Oral Daily   pregabalin   200 mg  Oral TID   senna-docusate  1 tablet Oral BID   sodium chloride  flush  3 mL Intravenous Q12H   sodium chloride  flush  3 mL Intravenous Q12H   Continuous Infusions: PRN Meds:.acetaminophen  **OR** acetaminophen , alum & mag hydroxide-simeth, bisacodyl, hydrALAZINE , HYDROmorphone  (DILAUDID ) injection, hydrOXYzine , ipratropium, metoCLOPramide  (REGLAN ) injection, ondansetron  **OR** ondansetron  (ZOFRAN ) IV, oxyCODONE , traZODone    Subjective:   Kristin Carson was seen and examined today.  Chest pain has improved. Still sob.  Objective:   Vitals:   06/25/24 1000 06/25/24 1030 06/25/24 1146 06/25/24 1151  BP: 105/70 105/70 (!) 88/57 93/74  Pulse: (!) 122  (!) 118   Resp: 14  (!) 22   Temp: 99.9 F (37.7 C)     TempSrc: Oral     SpO2:   90% 93%  Weight:      Height:        Intake/Output Summary (Last 24 hours) at 06/25/2024 1156 Last data filed at 06/25/2024 1033 Gross per 24 hour  Intake 1683 ml  Output 10 ml  Net 1673 ml   Filed Weights   06/24/24 0332 06/25/24 0428  Weight: 74.2 kg 72.3 kg     Exam General exam: Appears calm and comfortable  Respiratory system: diminished air entry at bases.  Cardiovascular system: S1 & S2 heard, tachycardic,  Gastrointestinal system: Abdomen is nondistended, soft and nontender. Central nervous system: Alert and oriented.  Extremities: Symmetric 5 x 5 power. Skin: No rashes,  Psychiatry: Mood & affect appropriate.    Data Reviewed:  I have personally reviewed following labs and imaging studies   CBC Lab Results  Component Value Date   WBC 10.5 06/25/2024   RBC 2.86 (L) 06/25/2024   HGB 7.9 (L) 06/25/2024   HCT 24.9 (L) 06/25/2024   MCV 87.1 06/25/2024   MCH 27.6 06/25/2024   PLT 169 06/25/2024   MCHC 31.7 06/25/2024   RDW 18.9 (H) 06/25/2024   LYMPHSABS 2.0 06/24/2024   MONOABS 1.9 (H) 06/24/2024   EOSABS 0.0 06/24/2024   BASOSABS 0.0 06/24/2024     Last metabolic panel Lab Results  Component Value Date   NA 133 (L)  06/25/2024   K 4.4 06/25/2024   CL 97 (L) 06/25/2024   CO2 26 06/25/2024   BUN 10 06/25/2024   CREATININE 1.19 (H) 06/25/2024   GLUCOSE 90 06/25/2024   GFRNONAA 55 (L) 06/25/2024   GFRAA >60 01/12/2018   CALCIUM  8.4 (L) 06/25/2024   PHOS 2.6 06/24/2024   PROT 6.8 06/24/2024   ALBUMIN 3.1 (L) 06/24/2024   BILITOT 0.6 06/24/2024   ALKPHOS 306 (H) 06/24/2024   AST 56 (H) 06/24/2024   ALT 9 06/24/2024   ANIONGAP 11 06/25/2024    CBG (last 3)  Recent Labs    06/25/24 0741  GLUCAP 86      Coagulation Profile: Recent Labs  Lab 06/24/24 0550  INR 1.0     Radiology Studies: US  CHEST (PLEURAL EFFUSION) Result Date: 06/24/2024 CLINICAL DATA:  Patient with history of cervical cancer with  metastases to the peritoneum and lungs, recent endobronchial washing pathology consistent with metastatic disease. Choose currently admitted with dyspnea and CTA shows new loculated left pleural effusion. Request for possible left thoracentesis EXAM: BILATERAL CHEST ULTRASOUND COMPARISON:  CTA chest 06/24/2024, chest x-ray 1 view 06/24/2024 FINDINGS: Limited ultrasound of the left chest shows a small volume loculated pleural effusion, potential procedure window in the inferior portion is obscured by lung flap. The most superior portion of the pleural effusion is slightly larger however in accessible due to the scapula. Limited ultrasound of the right chest shows no pleural fluid. IMPRESSION: Limited ultrasound of the left chest shows a small volume loculated pleural effusion which is not accessible for safe thoracentesis. No procedure performed. Electronically Signed   By: CHRISTELLA.  Shick M.D.   On: 06/24/2024 09:23   CT Angio Chest PE W/Cm &/Or Wo Cm Result Date: 06/24/2024 EXAM: CTA of the Chest with contrast for PE 06/24/2024 05:07:25 AM TECHNIQUE: CTA of the chest was performed after the administration of intravenous contrast. Multiplanar reformatted images are provided for review. MIP images are  provided for review. Automated exposure control, iterative reconstruction, and/or weight based adjustment of the mA/kV was utilized to reduce the radiation dose to as low as reasonably achievable. COMPARISON: 06/06/2024 CLINICAL HISTORY: Pulmonary embolism (PE) suspected, high prob; CP, SOB, metastatic cervical cancer. [facility] from home; Weak 3 days + SOB; Nausea + CP tonight; Stage 4 lung cancer FINDINGS: PULMONARY ARTERIES: Pulmonary arteries are adequately opacified for evaluation. No pulmonary embolism. The main pulmonary artery measures 3.4 cm. MEDIASTINUM: The heart is at the upper limits of normal in size. Coronary artery calcifications are present. Trace pericardial effusion. Prominent mediastinum. There is no acute abnormality of the thoracic aorta. LYMPH NODES: A left hilar lymph node is identified. No mediastinal or axillary lymphadenopathy. LUNGS AND PLEURA: Interval progression since the previous exam. There has been interval development of a loculated left pleural effusion overlying the left lung with components extending over the left apex. Signs of pulmonary metastases with multiple pleural-based nodules and masses in both lungs. Index lesion within the lingula is again identified measuring 4.1 x 3.1 cm (image 68/8), previously 4.1 x 3.0 cm. Index lesion within the lateral right middle lobe measures 3.2 x 1.7 cm (image 67/14), previously 2.7 x 1.7 cm. Lesion within the superior segment of the left lower lobe measures 2.8 x 2.4 cm (image 37/14), previously 2.3 x 2.5 cm. Mild emphysema with diffuse bronchial wall thickening. UPPER ABDOMEN: Bilateral liver metastases are again noted. Dominant lesion within the posteromedial right hepatic lobe measures 7.7 x 5.8 cm (image 109/8), previously 7.2 x 4.9 cm. Lesion within the medial segment of the left lobe of liver measures 6.7 x 6.4 cm (image 110/8), previously 5.8 x 5.5 cm. SOFT TISSUES AND BONES: No acute bone or soft tissue abnormality. IMPRESSION: 1. No  evidence of pulmonary embolism. 2. Interval progression of pulmonary metastases with multiple pleural-based nodules and masses in both lungs. 3. Interval development of a loculated left pleural effusion. Concerning for new malignant pleural effusion. 4. Bilateral liver metastases with interval progression. 5. Increased caliber of the main pulmonary artery suggestive of pulmonary artery hypertension. 6. Emphysema and coronary artery calcifications. Electronically signed by: Waddell Calk MD 06/24/2024 06:30 AM EDT RP Workstation: [id number]   DG Chest Port 1 View Result Date: 06/24/2024 CLINICAL DATA:  Shortness of breath, chest pain EXAM: PORTABLE CHEST 1 VIEW COMPARISON:  06/07/2024 FINDINGS: Cardiac shadow is stable. Right chest wall port is again  seen. Soft tissue density along the right lateral chest wall is noted consistent with the known history of metastatic disease better visualized on the current exam. The superior segment left lower lobe mass and lateral left basilar mass are noted as well. No new focal abnormality is noted. IMPRESSION: Multiple pulmonary metastatic lesions.  No acute abnormality noted. Electronically Signed   By: Oneil Devonshire M.D.   On: 06/24/2024 04:01       Elgie Butter M.D. [facility] 06/25/2024, 11:56 AM  Available via Epic secure chat 7am-7pm After 7 pm, please refer to night coverage provider listed on amion.

## 2024-06-25 NOTE — Plan of Care (Signed)

## 2024-06-25 NOTE — Progress Notes (Addendum)
   06/25/24 1151  Assess: MEWS Score  BP 93/74  MAP (mmHg) 82  SpO2 93 %  O2 Device Nasal Cannula  O2 Flow Rate (L/min) 2 L/min  Assess: MEWS Score  MEWS Temp 0  MEWS Systolic 1  MEWS Pulse 2  MEWS RR 1  MEWS LOC 0  MEWS Score 4  MEWS Score Color Red  Assess: if the MEWS score is Yellow or Red  Were vital signs accurate and taken at a resting state? Yes  Does the patient meet 2 or more of the SIRS criteria? No  MEWS guidelines implemented  Yes, red  Treat  MEWS Interventions Considered administering scheduled or prn medications/treatments as ordered  Take Vital Signs  Increase Vital Sign Frequency  Red: Q1hr x2, continue Q4hrs until patient remains green for 12hrs  Escalate  MEWS: Escalate Red: Discuss with charge nurse and notify provider. Consider notifying RRT. If remains red for 2 hours consider need for higher level of care  Notify: Charge Nurse/RN  Name of Charge Nurse/RN Notified Kim, RN  Provider Notification  Provider Name/Title Vijaya  Date Provider Notified 06/25/24  Time Provider Notified 1151  Method of Notification Page  Notification Reason Other (Comment) (oxygen  usage, increase HR and soft bp)  Provider response See new orders  Date of Provider Response 06/25/24  Time of Provider Response 1153   No new orders at this time.

## 2024-06-25 NOTE — Plan of Care (Signed)

## 2024-06-26 ENCOUNTER — Inpatient Hospital Stay (HOSPITAL_COMMUNITY)

## 2024-06-26 DIAGNOSIS — Z8541 Personal history of malignant neoplasm of cervix uteri: Secondary | ICD-10-CM | POA: Diagnosis not present

## 2024-06-26 DIAGNOSIS — Z7189 Other specified counseling: Secondary | ICD-10-CM

## 2024-06-26 DIAGNOSIS — R4182 Altered mental status, unspecified: Secondary | ICD-10-CM | POA: Diagnosis not present

## 2024-06-26 DIAGNOSIS — I10 Essential (primary) hypertension: Secondary | ICD-10-CM | POA: Diagnosis not present

## 2024-06-26 DIAGNOSIS — N179 Acute kidney failure, unspecified: Secondary | ICD-10-CM

## 2024-06-26 DIAGNOSIS — R079 Chest pain, unspecified: Secondary | ICD-10-CM

## 2024-06-26 DIAGNOSIS — K219 Gastro-esophageal reflux disease without esophagitis: Secondary | ICD-10-CM | POA: Diagnosis not present

## 2024-06-26 DIAGNOSIS — C538 Malignant neoplasm of overlapping sites of cervix uteri: Secondary | ICD-10-CM | POA: Diagnosis not present

## 2024-06-26 DIAGNOSIS — R29818 Other symptoms and signs involving the nervous system: Secondary | ICD-10-CM | POA: Diagnosis not present

## 2024-06-26 LAB — CBC
HCT: 24.4 % — ABNORMAL LOW (ref 36.0–46.0)
Hemoglobin: 7.7 g/dL — ABNORMAL LOW (ref 12.0–15.0)
MCH: 27.8 pg (ref 26.0–34.0)
MCHC: 31.6 g/dL (ref 30.0–36.0)
MCV: 88.1 fL (ref 80.0–100.0)
Platelets: 216 K/uL (ref 150–400)
RBC: 2.77 MIL/uL — ABNORMAL LOW (ref 3.87–5.11)
RDW: 19 % — ABNORMAL HIGH (ref 11.5–15.5)
WBC: 13.3 K/uL — ABNORMAL HIGH (ref 4.0–10.5)
nRBC: 0 % (ref 0.0–0.2)

## 2024-06-26 LAB — BASIC METABOLIC PANEL WITH GFR
Anion gap: 7 (ref 5–15)
BUN: 13 mg/dL (ref 6–20)
CO2: 28 mmol/L (ref 22–32)
Calcium: 8.4 mg/dL — ABNORMAL LOW (ref 8.9–10.3)
Chloride: 97 mmol/L — ABNORMAL LOW (ref 98–111)
Creatinine, Ser: 1.41 mg/dL — ABNORMAL HIGH (ref 0.44–1.00)
GFR, Estimated: 45 mL/min — ABNORMAL LOW (ref >60)
Glucose, Bld: 94 mg/dL (ref 70–99)
Potassium: 5.2 mmol/L — ABNORMAL HIGH (ref 3.5–5.1)
Sodium: 132 mmol/L — ABNORMAL LOW (ref 135–145)

## 2024-06-26 LAB — GLUCOSE, CAPILLARY: Glucose-Capillary: 78 mg/dL (ref 70–99)

## 2024-06-26 MED ORDER — SODIUM CHLORIDE 0.9 % IV SOLN
500.0000 mg | Freq: Once | INTRAVENOUS | Status: AC
  Administered 2024-06-26: 500 mg via INTRAVENOUS
  Filled 2024-06-26: qty 25

## 2024-06-26 MED ORDER — SODIUM CHLORIDE 0.9% FLUSH
10.0000 mL | Freq: Two times a day (BID) | INTRAVENOUS | Status: DC
  Administered 2024-06-26 – 2024-07-04 (×15): 10 mL

## 2024-06-26 MED ORDER — PREGABALIN 50 MG PO CAPS: 100.0000 mg | ORAL_CAPSULE | Freq: Three times a day (TID) | ORAL | Status: DC

## 2024-06-26 MED ORDER — CHLORHEXIDINE GLUCONATE CLOTH 2 % EX PADS
6.0000 | MEDICATED_PAD | Freq: Every day | CUTANEOUS | Status: DC
  Administered 2024-06-26 – 2024-07-04 (×8): 6 via TOPICAL

## 2024-06-26 MED ORDER — SODIUM CHLORIDE 0.9 % IV SOLN
INTRAVENOUS | Status: DC
Start: 2024-06-26 — End: 2024-06-26

## 2024-06-26 MED ORDER — SODIUM CHLORIDE 0.9% FLUSH: 10.0000 mL | INTRAVENOUS | Status: DC | PRN

## 2024-06-26 MED ORDER — DEXTROSE-SODIUM CHLORIDE 5-0.9 % IV SOLN: INTRAVENOUS | Status: AC

## 2024-06-26 NOTE — Assessment & Plan Note (Signed)
 Hyponatremia, hyperkalemia.   Worsening renal function with serum cr at 1,41 with K at 5,2 and serum bicarbonate at 28  Na 132   Plan to add IV fluids with isotonic saline and dextrose .  Follow up renal function and electrolytes in am Avoid hypotension and nephrotoxic medications.

## 2024-06-26 NOTE — Plan of Care (Signed)
   Problem: Health Behavior/Discharge Planning: Goal: Ability to manage health-related needs will improve Outcome: Progressing   Problem: Activity: Goal: Risk for activity intolerance will decrease Outcome: Progressing

## 2024-06-26 NOTE — Progress Notes (Signed)
 Responded to code stroke called on 53 year old female here for chest pain evaluation with new metastasis in the chest as well as loculated pleural effusions.  She was last known well at 7:30 AM and staff noted changes to her overall mentation with orientation only to her name at around 9 AM.  This was an abrupt change in mentation and she was also not able to follow simple commands at that time.  Upon my evaluation at code stroke 10 minutes later, she appears to be able to follow some simple commands such as gripping my fingers and wiggling her toes as well as sticking out her tongue, but this is quite delayed and she appears overall very confused and nonverbal.  Medications reviewed this morning and patient did receive her oral OxyContin  as well as Lyrica  shortly prior to this event, but this is not felt to contribute to her current symptoms.  Agree with code stroke and stat head CT for further evaluation.  CBG was within normal limits and vital signs were unremarkable.  Labs reviewed from this a.m. with some mild hyperkalemia as well as creatinine bump and leukocytosis noted from the day prior.  If head CT unremarkable, could consider further workup to include UA as well as other metabolic derangements.  Total critical care time: 30 minutes.

## 2024-06-26 NOTE — Assessment & Plan Note (Signed)
 Likely moderate protein calorie malnutrition, consult dietary for supplementation

## 2024-06-26 NOTE — Plan of Care (Signed)

## 2024-06-26 NOTE — Progress Notes (Signed)
 Upon assessment patient was  laying in bed asleep , husband bedside asleep as well. Patient woken up due to breakfast being available and morning medications, this nurse allowed patient some time to wake up and sit up in bed. After 5 minutes patient was asked orientation questions and only able to tell me her name, not able to recall that she's at the hospital, nor the year or month. When asking patient to follow simple commends patient unable to do so or delayed responses. Code stroke initiated stroke cart bedside, MD bedside, charge nurse, and [facility]. Patient taken down to CT for imaging.

## 2024-06-26 NOTE — Progress Notes (Signed)
 Progress Note   Patient: Kristin Carson FMW:[id number] DOB: [birth year] DOA: 06/24/2024     1 DOS: the patient was seen and examined on 06/26/2024   Brief hospital course: Mrs. 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 Reports pain has been increasingly getting worse over the past 3 days prior to admission, considerable dyspnea with occasional cough.  Pain radiates to the left side and back. EMS was called and she was transported to the ED.  On her initial physical examination blood pressure 122/98, HR 116, RR 17 and 02 saturation 93% Lungs with no wheezing or rhonchi, heart with S1 and S2 present and regular, abdomen with no distention, soft and non tender, no lower extremity edema.   CTA chest: Negative for PE, progression of pulmonary metastases, multiple pleural based nodules, loculated left pleural effusion, bilateral liver metastases, pulmonary hypertension.  Emphysema.  CAD.   IR consulted, small volume loculated pleural effusion on the left. Not able to access through thoracentesis.  Consulted palliative care.  10/26 acute disorientation, code stroke called, negative CT head. Recovered well. Stroke ruled out.   Assessment and Plan: * Cervical cancer [facility]) S/p treatment chemotherapy 3 cycles, S/p evaluation by pulmonology, bronchoscopy bronchial wash -lesion, cytology consistent with metastatic cancer  Metastatic cervical carcinoma -  to liver peritoneum, and lungs with a new nodule of pleural effusion  CTA Progression of pulmonary metastases, multiple pleural based nodules, loculated left pleural effusion, bilateral liver metastases, pulmonary hypertension.   Pending palliative care consultation, will need follow up  with oncology.   Acute toxic metabolic encephalopathy, resolved.  Continue pain control with oxycodone  IR and acetaminophen .  Hold on non steroidal antiinflammatory agents due to reduced GFR. Hold on IV hydromorphone , long acting oxycodone  and pregabalin  to avoid encephalopathy.  Very poor prognosis.   Essential hypertension Hold amlodipine  due to risk of hypotension.    AKI (acute kidney injury) Hyponatremia, hyperkalemia.   Worsening renal function with serum cr at 1,41 with K at 5,2 and serum bicarbonate at 28  Na 132   Plan to add IV fluids with isotonic saline and dextrose .  Follow up renal function and electrolytes in am Avoid hypotension and nephrotoxic medications.   GERD (gastroesophageal reflux disease) Continue pantoprazole .   Iron deficiency anemia due to chronic blood loss Cell count stable with hgb 7.7  Iron panel with serum iron 15. TIBC 204 and transferrin saturation of 7.  Will order IV iron infusion.    Hypoalbuminemia due to protein-calorie malnutrition Likely moderate protein calorie malnutrition, consult dietary for supplementation   Goals of care, counseling/discussion Follow up with palliative care.   Tobacco use Nicotine  patch.      Subjective: Patient is feeling better, no more confusion, or disorientation, she is back to baseline, chest pain is controlled.   Physical Exam: Vitals:   06/26/24 0529 06/26/24 0849 06/26/24 0908 06/26/24 1205  BP: 109/75 112/76  102/74  Pulse: 99   100  Resp: 20   18  Temp: 98.2 F (36.8 C)  (!) 100.5 F (38.1 C) 99.6 F (37.6 C)  TempSrc: Oral   Oral  SpO2: 95%   95%  Weight:      Height:       Neurology awake and alert ENT with mild  pallor Cardiovascular with S1 and S2 present and regular with no gallops,  Respiratory with no rales or wheezing, no rhonchi, poor inspiratory effort Abdomen with no distention  No lower extremity edema   Data Reviewed:    Family Communication: I spoke with  patient's husband at the bedside, we talked in detail about patient's condition, plan of care and prognosis and all questions were addressed.   Disposition: Status is: Inpatient Remains inpatient appropriate because: pending palliative care consultation   Planned Discharge Destination: Home     Author: Elidia Toribio Furnace, MD 06/26/2024 3:06 PM  For on call review [email or web address].

## 2024-06-27 ENCOUNTER — Inpatient Hospital Stay: Admitting: Oncology

## 2024-06-27 ENCOUNTER — Inpatient Hospital Stay

## 2024-06-27 DIAGNOSIS — J449 Chronic obstructive pulmonary disease, unspecified: Secondary | ICD-10-CM | POA: Diagnosis not present

## 2024-06-27 DIAGNOSIS — E46 Unspecified protein-calorie malnutrition: Secondary | ICD-10-CM | POA: Diagnosis not present

## 2024-06-27 DIAGNOSIS — Z515 Encounter for palliative care: Secondary | ICD-10-CM

## 2024-06-27 DIAGNOSIS — D500 Iron deficiency anemia secondary to blood loss (chronic): Secondary | ICD-10-CM | POA: Diagnosis not present

## 2024-06-27 DIAGNOSIS — C539 Malignant neoplasm of cervix uteri, unspecified: Secondary | ICD-10-CM | POA: Diagnosis not present

## 2024-06-27 DIAGNOSIS — R0781 Pleurodynia: Secondary | ICD-10-CM

## 2024-06-27 DIAGNOSIS — E8809 Other disorders of plasma-protein metabolism, not elsewhere classified: Secondary | ICD-10-CM | POA: Diagnosis not present

## 2024-06-27 DIAGNOSIS — Z720 Tobacco use: Secondary | ICD-10-CM | POA: Diagnosis not present

## 2024-06-27 DIAGNOSIS — J90 Pleural effusion, not elsewhere classified: Secondary | ICD-10-CM | POA: Diagnosis not present

## 2024-06-27 DIAGNOSIS — C538 Malignant neoplasm of overlapping sites of cervix uteri: Secondary | ICD-10-CM | POA: Diagnosis not present

## 2024-06-27 DIAGNOSIS — Z7189 Other specified counseling: Secondary | ICD-10-CM | POA: Diagnosis not present

## 2024-06-27 DIAGNOSIS — C7802 Secondary malignant neoplasm of left lung: Secondary | ICD-10-CM | POA: Diagnosis not present

## 2024-06-27 DIAGNOSIS — K219 Gastro-esophageal reflux disease without esophagitis: Secondary | ICD-10-CM | POA: Diagnosis not present

## 2024-06-27 DIAGNOSIS — I10 Essential (primary) hypertension: Secondary | ICD-10-CM | POA: Diagnosis not present

## 2024-06-27 DIAGNOSIS — N179 Acute kidney failure, unspecified: Secondary | ICD-10-CM | POA: Diagnosis not present

## 2024-06-27 LAB — BASIC METABOLIC PANEL WITH GFR
Anion gap: 7 (ref 5–15)
BUN: 12 mg/dL (ref 6–20)
CO2: 27 mmol/L (ref 22–32)
Calcium: 8 mg/dL — ABNORMAL LOW (ref 8.9–10.3)
Chloride: 101 mmol/L (ref 98–111)
Creatinine, Ser: 0.93 mg/dL (ref 0.44–1.00)
GFR, Estimated: >60 mL/min (ref >60)
Glucose, Bld: 110 mg/dL — ABNORMAL HIGH (ref 70–99)
Potassium: 4.7 mmol/L (ref 3.5–5.1)
Sodium: 135 mmol/L (ref 135–145)

## 2024-06-27 LAB — CBC
HCT: 21.6 % — ABNORMAL LOW (ref 36.0–46.0)
Hemoglobin: 6.9 g/dL — CL (ref 12.0–15.0)
MCH: 28.4 pg (ref 26.0–34.0)
MCHC: 31.9 g/dL (ref 30.0–36.0)
MCV: 88.9 fL (ref 80.0–100.0)
Platelets: 284 K/uL (ref 150–400)
RBC: 2.43 MIL/uL — ABNORMAL LOW (ref 3.87–5.11)
RDW: 19.2 % — ABNORMAL HIGH (ref 11.5–15.5)
WBC: 16.1 K/uL — ABNORMAL HIGH (ref 4.0–10.5)
nRBC: 0 % (ref 0.0–0.2)

## 2024-06-27 LAB — GLUCOSE, CAPILLARY: Glucose-Capillary: 111 mg/dL — ABNORMAL HIGH (ref 70–99)

## 2024-06-27 LAB — PREPARE RBC (CROSSMATCH)

## 2024-06-27 MED ORDER — SODIUM CHLORIDE 0.9% IV SOLUTION: Freq: Once | INTRAVENOUS | Status: AC

## 2024-06-27 MED ORDER — MORPHINE SULFATE (PF) 2 MG/ML IV SOLN
2.0000 mg | INTRAVENOUS | Status: DC | PRN
  Administered 2024-06-28 – 2024-07-05 (×18): 2 mg via INTRAVENOUS
  Filled 2024-06-27 (×19): qty 1

## 2024-06-27 NOTE — Plan of Care (Signed)
  Problem: Education: Goal: Knowledge of General Education information will improve Description: Including pain rating scale, medication(s)/side effects and non-pharmacologic comfort measures Outcome: Progressing   Problem: Clinical Measurements: Goal: Ability to maintain clinical measurements within normal limits will improve Outcome: Progressing Goal: Respiratory complications will improve Outcome: Progressing Goal: Cardiovascular complication will be avoided Outcome: Progressing   Problem: Elimination: Goal: Will not experience complications related to bowel motility Outcome: Progressing Goal: Will not experience complications related to urinary retention Outcome: Progressing

## 2024-06-27 NOTE — Progress Notes (Signed)
   06/27/24 9385  Provider Notification  Provider Name/Title Lawence, MD  Date Provider Notified 06/27/24  Time Provider Notified [phone]  Method of Notification  (Secure Chat)  Notification Reason Critical Result  Date Critical Result Received 06/27/24  Time Critical Result Received 0613  Provider response No new orders  Date of Provider Response 06/27/24  Time of Provider Response 0617   Hemoglobin of 6.9, MD Ricky made aware as well.

## 2024-06-27 NOTE — Consult Note (Signed)
 Palliative Care Consult Note                                  Date: 06/27/2024   Patient Name: Kristin Carson  DOB: [birth year]  MRN: [id number]  Age / Sex: 53 y.o., female  PCP: [name], FNP Referring Physician: Ricky Fines, MD  Reason for Consultation: Establishing goals of care  Past Medical History:  Diagnosis Date   Anemia    Asthma    Bronchitis    Cancer (HCC)    Cervical   CHF (congestive heart failure) (HCC)    COPD (chronic obstructive pulmonary disease) (HCC)    Depression    Dyspnea    GERD (gastroesophageal reflux disease)    Headache    Hypertension    Port-A-Cath in place 09/25/2022    Subjective:   This NP Camellia Kays reviewed medical records, received report from team, assessed the patient and then meet at the patient's bedside to discuss diagnosis, prognosis, GOC, EOL wishes disposition and options.  Before meeting with the patient/family, I spent time reviewing the chart notes including admission H&P, previous outpatient oncology notes, previous cancer treatment notes, nursing note from yesterday, hospice note from yesterday, nursing notes from today, TOC note from today.  Later today I reviewed the chaplain note from today and hospitalist note from today. I also reviewed vital signs, nursing flowsheets, medication administrations record, labs, and imaging. Labs reviewed include normalized creatinine at 0.93 in the setting of previous bump in creatinine which was normal on admission at 0.91 then increased to 1.19 and 1.41 yesterday.  CBC shows progressive increase in white blood cell count from 10.4 on admission to 10.5 two days ago, 13.3 yesterday, 16.1 today in the setting of loculated pleural effusion with progressive lung cancer despite treatment.  Hemoglobin dropped from 7.7 yesterday to 6.9 today, planned PRBC today.  I met with the patient at bedside, no family was present.   We meet to discuss  diagnosis prognosis, GOC, EOL wishes, disposition and options. Concept of Palliative Care was introduced as specialized medical care for people and their families living with serious illness.  If focuses on providing relief from the symptoms and stress of a serious illness.  The goal is to improve quality of life for both the patient and the family. Values and goals of care important to patient and family were attempted to be elicited.  Created space and opportunity for patient  and family to explore thoughts and feelings regarding current medical situation   Natural trajectory and current clinical status were discussed. Questions and concerns addressed. Patient  encouraged to call with questions or concerns.    Patient/Family Understanding of Illness: She understands that they were going to do a procedure on her right side for her lungs but then they did not do this and are now going to give her blood.  When asked she states that she was diagnosed with cancer in 2004, previously saw Dr. Prentiss analyses Dr. [place] she states that she had chest pain for 3 days, thought it was because of constipation, came to the hospital.  We spent substantial time talking about her chronic history and acute illness.  She understands that she has cancer and has undergone 3 lines of treatment and continues to have cancer progression.  She is not sure what options there may be left.  Life Review: The patient  has 1 daughter, is married to her husband Lynwood.  She also has a sister and a brother.  Today's Discussion: In addition to discussions described above we had extensive discussion on various topics.  We spent a good amount of time talking about the progression through various lines of treatment, including most recently third line of treatment.  Her cancer has progressed despite this.  She is not sure what treatment options are left and I shared that I could not offer information on this.   However, I shared that oncology has been consulted and hopefully they will be able to see her and discuss what options may be left.  However, I was clear that there is a possibility that there are no treatment options.  If not, we could talk about options on how to care for her if there are no further cancer treatment options available.  She seems to be intermittently, understandably, emotional and tearful about this.  During my visit, Dr. Allayne from the hospitalist team came to see the patient as well.  He also spent time talking about her illness and cancer treatments.  He discussed a poor prognosis as well given that she has been through 3 lines of treatment and continues to have progression.  Per patient's request, he shares that he will call her sister Katrina and discussed this with her as well.  When asked who she would want to be her surrogate decision maker if she cannot make her decisions she states that she would want her brother Carliss Pinnix to be her education officer, environmental.  I encouraged her to complete HCPOA documentation to put this into effect given that she is legally married and has other siblings and a daughter as well.  She is in agreement to complete HCPOA documentation.  At this point, the patient states that she is tired and wants to take a nap.  I offered that I would leave the room to allow her to rest.  We agreed the next step would be to speak with oncology and discussed what treatment options may be available.  I shared that I would return tomorrow to talk further about various options offered to help the patient think through what she would want to do.  I also encouraged her to continue to speak with her family as well.  I provided emotional and general support through therapeutic listening, empathy, sharing of stories, and other techniques. I answered all questions and addressed all concerns to the best of my ability.  Review of Systems  Respiratory:  Positive for cough and  shortness of breath.   Gastrointestinal:  Negative for abdominal pain, nausea and vomiting.    Objective:   Primary Diagnoses: Present on Admission:  Essential hypertension  GERD (gastroesophageal reflux disease)  Cervical cancer (HCC)  Hypoalbuminemia due to protein-calorie malnutrition  Iron deficiency anemia due to chronic blood loss  Tobacco use   Vital Signs:  BP (!) 94/52 (BP Location: Left Arm)   Pulse 99   Temp 98.2 F (36.8 C) (Oral)   Resp 18   Ht 5' 1 (1.549 m)   Wt 77.1 kg   LMP 08/15/2022 (Approximate)   SpO2 95%   BMI 32.12 kg/m   Physical Exam Vitals and nursing note reviewed.  Constitutional:      General: She is not in acute distress.    Appearance: She is ill-appearing.  HENT:     Head: Normocephalic and atraumatic.  Cardiovascular:     Rate and Rhythm: Normal rate.  Pulmonary:     Effort: Pulmonary effort is normal. No respiratory distress.  Abdominal:     General: Abdomen is flat.     Palpations: Abdomen is soft.  Skin:    General: Skin is warm and dry.  Neurological:     General: No focal deficit present.     Mental Status: She is alert and oriented to person, place, and time.  Psychiatric:        Mood and Affect: Mood normal.        Behavior: Behavior normal.     Palliative Assessment/Data: 50-60%   Assessment & Plan:   HPI/Patient Profile: 53 y.o. 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  She was admitted on 06/24/2024 with cervical cancer with metastasis to lungs, liver, peritoneum now found to be further progressive status posttreatment of chemotherapy on 3 lines of treatment, acute toxic metabolic encephalopathy, chest pain, AKI, IDA, hypoalbuminemia, and others  Palliative medicine was consulted for GOC conversations.  SUMMARY  OF RECOMMENDATIONS   Full code Full scope of care Awaiting further oncology input on remaining treatment options, if any Ongoing GOC conversations when options are clear Palliative medicine will continue to follow  Symptom Management:  Per primary team Palliative medicine is available to assist as needed  Code Status: Full Code  Prognosis:  Unable to determine  Discharge Planning:  To Be Determined   Discussed with: Patient, medical team, nursing team    Thank you for allowing us  to participate in the care of MALY LEMARR PMT will continue to support holistically.  Billing based on MDM: Moderate  Detailed review of medical records (labs, imaging, vital signs), medically appropriate exam, discussed with treatment team, counseling and education to patient, family, & staff, documenting clinical information, medication management, coordination of care  Signed by: Camellia Kays, NP Palliative Medicine Team  Team Phone # [phone] (Nights/Weekends)  06/27/2024, 12:24 PM

## 2024-06-27 NOTE — Progress Notes (Signed)
   06/26/24 2346  Assess: MEWS Score  Temp 98.3 F (36.8 C)  BP 114/89  MAP (mmHg) 97  Pulse Rate (!) 113  Resp 18  Level of Consciousness Alert  SpO2 91 %  O2 Device Nasal Cannula  O2 Flow Rate (L/min) 3 L/min  Assess: MEWS Score  MEWS Temp 0  MEWS Systolic 0  MEWS Pulse 2  MEWS RR 0  MEWS LOC 0  MEWS Score 2  MEWS Score Color Yellow  Assess: if the MEWS score is Yellow or Red  Were vital signs accurate and taken at a resting state? Yes  Does the patient meet 2 or more of the SIRS criteria? No  MEWS guidelines implemented  Yes, yellow  Treat  MEWS Interventions Considered administering scheduled or prn medications/treatments as ordered  Take Vital Signs  Increase Vital Sign Frequency  Yellow: Q2hr x1, continue Q4hrs until patient remains green for 12hrs  Escalate  MEWS: Escalate Yellow: Discuss with charge nurse and consider notifying provider and/or RRT  Provider Notification  Provider Name/Title Lawence, MD  Date Provider Notified 06/26/24  Time Provider Notified 2350  Method of Notification  (Secure Chat)  Notification Reason Other (Comment) (Yellow Mews, pt also awoken to complaints of abdominal pain and crying in pain.)  Provider response See new orders (Morphine)  Date of Provider Response 06/26/24  Time of Provider Response 2359  Assess: SIRS CRITERIA  SIRS Temperature  0  SIRS Respirations  0  SIRS Pulse 1  SIRS WBC 0  SIRS Score Sum  1

## 2024-06-27 NOTE — Plan of Care (Signed)
   Problem: Education: Goal: Knowledge of General Education information will improve Description: Including pain rating scale, medication(s)/side effects and non-pharmacologic comfort measures Outcome: Progressing   Problem: Clinical Measurements: Goal: Will remain free from infection Outcome: Progressing   Problem: Activity: Goal: Risk for activity intolerance will decrease Outcome: Progressing

## 2024-06-27 NOTE — Progress Notes (Signed)
   06/27/24 1458  Spiritual Encounters  Type of Visit Initial  Care provided to: Patient  Referral source Patient request  Reason for visit Advance directives  OnCall Visit No   Chaplain responded to Spiritual Consult for Advance Care Directive.  Chaplain arrived in room and delivered ACD education. Instructed Pt how to reach out to Spiritual Care if they have any questions and to contact us  when they are ready to move forward.   Maude Roll, MDiv Chaplain, [facility] [email or web address] [phone]

## 2024-06-27 NOTE — Progress Notes (Signed)
 MEWS Progress Note  Patient Details Name: Kristin Carson MRN: [id number] DOB: [birth year] Today's Date: 06/27/2024   MEWS Flowsheet Documentation:  Assess: MEWS Score Temp: 98.5 F (36.9 C) BP: 120/75 MAP (mmHg): 91 Pulse Rate: (!) 114 ECG Heart Rate: (!) 114 Resp: 20 Level of Consciousness: Alert SpO2: 95 % O2 Device: Nasal Cannula Patient Activity (if Appropriate): In bed O2 Flow Rate (L/min): 2.5 L/min Assess: MEWS Score MEWS Temp: 0 MEWS Systolic: 0 MEWS Pulse: 2 MEWS RR: 0 MEWS LOC: 0 MEWS Score: 2 MEWS Score Color: Yellow Assess: SIRS CRITERIA SIRS Temperature : 0 SIRS Respirations : 0 SIRS Pulse: 1 SIRS WBC: 0 SIRS Score Sum : 1 SIRS Temperature : 0 SIRS Pulse: 1 SIRS Respirations : 0 SIRS WBC: 0 SIRS Score Sum : 1 Assess: if the MEWS score is Yellow or Red Were vital signs accurate and taken at a resting state?: Yes Does the patient meet 2 or more of the SIRS criteria?: No MEWS guidelines implemented : Yes, yellow Treat MEWS Interventions: Considered administering scheduled or prn medications/treatments as ordered Take Vital Signs Increase Vital Sign Frequency : Yellow: Q2hr x1, continue Q4hrs until patient remains green for 12hrs Escalate MEWS: Escalate: Yellow: Discuss with charge nurse and consider notifying provider and/or RRT        [name] 06/27/2024, 10:50 PM

## 2024-06-27 NOTE — Progress Notes (Signed)
 Progress Note   Patient: Kristin Carson FMW:[id number] DOB: 11-29-1970 DOA: 06/24/2024     2 DOS: the patient was seen and examined on 06/27/2024   Brief hospital admission narrative: As per H&P written by Dr. Willette on 06/24/2024 Kristin Carson is a 12 Y female with 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 Presenting with progressive chest pain.  Reports pain has been increasingly getting worse over the past 3 days, considerable shortness of breath, with occasional cough.  Pain radiates to the left side and back. No report of diaphoresis, fever, chills nausea or vomiting.     ED Evaluation: Blood pressure (!) 122/98, pulse (!) 116, temperature 98.2 F (36.8 C), resp. rate 17, height 5' 1 (1.549 m), weight 74.2 kg, last menstrual period 08/15/2022, SpO2 93%.   Labs: CBC WBC 10.4, hemoglobin 8.0, BMP within normal limits with calcium  of 8.8, glucose 100   CTA chest: Negative for PE, progression of pulmonary metastases, multiple pleural based nodules, loculated left pleural effusion, bilateral liver metastases, pulmonary hypertension.  Emphysema.  CAD.   Patient to be admitted for pain management, possible pleural effusion, palliative consultation, oncology consultation to determine path, goals of care  Patient Denies having: Fever, Chills, Cough, SOB, Abd pain, N/V/D, headache, dizziness, lightheadedness,  Dysuria, Joint pain, rash, open wounds.  Assessment and Plan: * Cervical cancer (HCC) -S/p treatment chemotherapy 3 cycles, -S/p evaluation by pulmonology, bronchoscopy bronchial wash -lesion, cytology consistent with metastatic cancer. - Metastatic disease to liver, peritoneal and and lungs. - Images demonstrating further progression of pulmonary metastasis with multiple new nodules in her pleural space and lung with pleural effusion and pulmonary  hypertension. - Appreciate assistance and recommendation by palliative care. - Oncology service has been consulted and will follow any further inputs. - Will continue supportive care and as needed analgesics.  Acute toxic metabolic encephalopathy, resolved.  Continue pain control with oxycodone  IR and acetaminophen .  Hold on non steroidal antiinflammatory agents due to reduced GFR. Hold on IV hydromorphone , long acting oxycodone  and pregabalin  to avoid encephalopathy.  Overall with very poor prognosis.  - Acute stroke has been ruled out.  Essential hypertension -Continue to hold amlodipine  due to risk of hypotension.    AKI (acute kidney injury) -Hyponatremia, hyperkalemia.  -Worsening renal function with serum cr at 1,41 with K at 5,2 and serum bicarbonate at 28 and Na 132  - Patient received fluid resuscitation - Some improvement in electrolytes changes and renal function appreciated - Continue to minimize nephrotoxic agents and avoid hypotension/contrast as much as possible. -Patient advised to maintain adequate oral hydration.  GERD (gastroesophageal reflux disease) -Continue pantoprazole .   Iron deficiency anemia due to chronic blood loss -Hemoglobin down to 6.9 - 1 unit PRBCs transfused - Patient received IV iron on 06/26/2024 - No overt bleeding appreciated - Continue to follow trend.  Hypoalbuminemia due to protein-calorie malnutrition -Likely moderate protein calorie malnutrition, consult dietary for supplementation   Goals of care, counseling/discussion -Goals of care discussion and advance care planning ongoing - Very poor prognosis  Tobacco use -Continue nicotine  patch.    Subjective: Chronically ill in appearance; 2-3 L nasal cannula supplementation in place.  Mild paler on exam.  Hemoglobin this morning down to 6.9.  Physical Exam: Vitals:   06/27/24 0537 06/27/24 1119 06/27/24 1120 06/27/24 1417  BP: 104/74 (!) 94/52  114/65  Pulse: (!) 111 99  99   Resp:  20 18  20   Temp: 99.4 F (37.4 C) 98.2 F (36.8 C)  98.3 F (36.8 C)  TempSrc: Oral Oral  Oral  SpO2: 94% 92% 95% 94%  Weight: 77.1 kg     Height:       General exam: Alert, awake, oriented x 3; still reporting some discomfort with deep inspiration. Respiratory system: 2-3 L nasal cannula supplementation in place; no using accessory muscles.  Reporting short winded sensation with minimal activity. Cardiovascular system:RRR.  No rubs, no gallops, no JVD. Gastrointestinal system: Abdomen is nondistended, soft and nontender. No organomegaly or masses felt. Normal bowel sounds heard. Central nervous system: Moving 4 limbs spontaneously; no focal neurological deficits. Extremities: No C/C/E, +pedal pulses Skin: No petechiae. Psychiatry: Judgement and insight appear normal.  Flat affect on exam.  Data Reviewed: CBC: WBC 16.1, hemoglobin 6.9, platelet count 284K Basic metabolic panel: Sodium 135, potassium 4.7, chloride 101, bicarb 27, BUN 12, creatinine 0.93 and GFR >60   Family Communication: No family at bedside on today's examination; patient provided info/phone number for her sister but unable to reach over the phone.   Disposition: Status is: Inpatient Remains inpatient appropriate because: pending palliative care consultation   Planned Discharge Destination: Home   Author: Eric Nunnery, MD 06/27/2024 5:46 PM  For on call review [email or web address].

## 2024-06-27 NOTE — TOC Initial Note (Signed)
 Transition of Care [facility]) - Initial/Assessment Note    Patient Details  Name: Kristin Carson MRN: [id number] Date of Birth: 25-Jan-1971  Transition of [facility]) CM/SW Contact:    Lucie Lunger, LCSWA Phone Number: 06/27/2024, 11:19 AM  Clinical Narrative:                 CSW notes pt is high risk for readmission. CSW spoke with pt at bedside to complete assessment. Pt states that she and her ex husband live together. Pt states that she can do some ADLs. Pt states that her niece is able to assist in her care. Pt has transport to appointments. TOC to follow.   Expected Discharge Plan: Home/Self Care Barriers to Discharge: Continued Medical Work up   Patient Goals and CMS Choice Patient states their goals for this hospitalization and ongoing recovery are:: return home CMS Medicare.gov Compare Post Acute Care list provided to:: Patient Choice offered to / list presented to : Patient      Expected Discharge Plan and Services In-house Referral: Clinical Social Work Discharge Planning Services: CM Consult   Living arrangements for the past 2 months: Single Family Home                                      Prior Living Arrangements/Services Living arrangements for the past 2 months: Single Family Home Lives with:: Relatives Patient language and need for interpreter reviewed:: Yes Do you feel safe going back to the place where you live?: Yes      Need for Family Participation in Patient Care: Yes (Comment) Care giver support system in place?: Yes (comment) Current home services: DME Criminal Activity/Legal Involvement Pertinent to Current Situation/Hospitalization: No - Comment as needed  Activities of Daily Living   ADL Screening (condition at time of admission) Independently performs ADLs?: No Does the patient have a NEW difficulty with bathing/dressing/toileting/self-feeding that is expected to last >3 days?: Yes (Initiates electronic notice to provider for possible OT  consult) Does the patient have a NEW difficulty with getting in/out of bed, walking, or climbing stairs that is expected to last >3 days?: Yes (Initiates electronic notice to provider for possible PT consult) Does the patient have a NEW difficulty with communication that is expected to last >3 days?: No Is the patient deaf or have difficulty hearing?: No Does the patient have difficulty seeing, even when wearing glasses/contacts?: No Does the patient have difficulty concentrating, remembering, or making decisions?: Yes  Permission Sought/Granted                  Emotional Assessment Appearance:: Appears stated age Attitude/Demeanor/Rapport: Engaged Affect (typically observed): Accepting Orientation: : Oriented to Self, Oriented to Place, Oriented to  Time, Oriented to Situation Alcohol  / Substance Use: Not Applicable Psych Involvement: No (comment)  Admission diagnosis:  Pleuritic chest pain [R07.81] Pleural effusion [J90] Chest pain [R07.9] Malignant neoplasm metastatic to left lung [place]) [C78.02] Patient Active Problem List   Diagnosis Date Noted   AKI (acute kidney injury) 06/26/2024   Chest pain 06/24/2024   Lung mass 05/25/2024   Tobacco use 05/05/2024   Corneal abnormality 01/24/2024   Nausea & vomiting 01/24/2024   Drug-induced liver injury 01/24/2024   Elevated liver enzymes 01/24/2024   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 11/24/2023   Goals of care, counseling/discussion 10/29/2023   Acute respiratory failure with hypoxia (HCC) 10/26/2023  Hypoalbuminemia due to protein-calorie malnutrition 10/26/2023   Lactic acidosis 10/26/2023   Influenza A 10/25/2023   Uterine cancer (HCC) 10/15/2023   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   GERD (gastroesophageal reflux disease) 11/18/2022   Dermatitis 11/18/2022   MDD (major depressive disorder) 11/18/2022   Iron deficiency anemia due to chronic blood loss 10/02/2022   Port-A-Cath in place  09/25/2022   Cervical cancer (HCC) 09/19/2022   COVID-19 virus infection 08/28/2022   Essential hypertension 12/10/2016   COPD exacerbation (HCC) 12/10/2016   Cigarette nicotine  dependence with nicotine -induced disorder 12/10/2016   Chronic kidney disease 12/10/2016   SOB (shortness of breath) 04/07/2013   PCP:  [name], FNP Pharmacy:   [facility] [address] [place] [address] [place] [address] Phone: [phone] Fax: [phone]     Social Drivers of Health (SDOH) Social History: SDOH Screenings   Food Insecurity: No Food Insecurity (06/24/2024)  Housing: Low Risk  (06/24/2024)  Transportation Needs: No Transportation Needs (06/24/2024)  Utilities: Not At Risk (06/24/2024)  Alcohol  Screen: Low Risk  (09/09/2022)  Depression (PHQ2-9): Low Risk  (06/13/2024)  Recent Concern: Depression (PHQ2-9) - High Risk (04/14/2024)  Financial Resource Strain: Low Risk  (09/09/2022)  Physical Activity: Insufficiently Active (09/09/2022)  Social Connections: Socially Isolated (01/24/2024)  Stress: No Stress Concern Present (09/09/2022)  Tobacco Use: High Risk (06/24/2024)   SDOH Interventions:     Readmission Risk Interventions    06/27/2024   11:18 AM 10/26/2023    8:07 AM 08/29/2022    2:18 PM  Readmission Risk Prevention Plan  Transportation Screening Complete Complete Complete  PCP or Specialist Appt within 5-7 Days   Complete  Home Care Screening  Complete Complete  Medication Review (RN CM)  Complete Complete  HRI or Home Care Consult Complete    Social Work Consult for Recovery Care Planning/Counseling Complete    Palliative Care Screening Not Applicable    Medication Review [employer]) Complete

## 2024-06-27 NOTE — Progress Notes (Addendum)
 Lab called with a critical Hemoglobin of 6.9, MD Mansy and [place] made aware.

## 2024-06-28 DIAGNOSIS — C787 Secondary malignant neoplasm of liver and intrahepatic bile duct: Secondary | ICD-10-CM | POA: Diagnosis not present

## 2024-06-28 DIAGNOSIS — J449 Chronic obstructive pulmonary disease, unspecified: Secondary | ICD-10-CM | POA: Diagnosis not present

## 2024-06-28 DIAGNOSIS — C7801 Secondary malignant neoplasm of right lung: Secondary | ICD-10-CM

## 2024-06-28 DIAGNOSIS — C786 Secondary malignant neoplasm of retroperitoneum and peritoneum: Secondary | ICD-10-CM | POA: Diagnosis not present

## 2024-06-28 DIAGNOSIS — C539 Malignant neoplasm of cervix uteri, unspecified: Secondary | ICD-10-CM | POA: Diagnosis not present

## 2024-06-28 DIAGNOSIS — C7802 Secondary malignant neoplasm of left lung: Secondary | ICD-10-CM

## 2024-06-28 DIAGNOSIS — Z515 Encounter for palliative care: Secondary | ICD-10-CM

## 2024-06-28 LAB — CBC
HCT: 24.3 % — ABNORMAL LOW (ref 36.0–46.0)
Hemoglobin: 8.1 g/dL — ABNORMAL LOW (ref 12.0–15.0)
MCH: 28.1 pg (ref 26.0–34.0)
MCHC: 33.3 g/dL (ref 30.0–36.0)
MCV: 84.4 fL (ref 80.0–100.0)
Platelets: 326 K/uL (ref 150–400)
RBC: 2.88 MIL/uL — ABNORMAL LOW (ref 3.87–5.11)
RDW: 19.7 % — ABNORMAL HIGH (ref 11.5–15.5)
WBC: 19.5 K/uL — ABNORMAL HIGH (ref 4.0–10.5)
nRBC: 0 % (ref 0.0–0.2)

## 2024-06-28 LAB — BASIC METABOLIC PANEL WITH GFR
Anion gap: 7 (ref 5–15)
BUN: 9 mg/dL (ref 6–20)
CO2: 27 mmol/L (ref 22–32)
Calcium: 8.5 mg/dL — ABNORMAL LOW (ref 8.9–10.3)
Chloride: 102 mmol/L (ref 98–111)
Creatinine, Ser: 0.86 mg/dL (ref 0.44–1.00)
GFR, Estimated: >60 mL/min (ref >60)
Glucose, Bld: 95 mg/dL (ref 70–99)
Potassium: 4.6 mmol/L (ref 3.5–5.1)
Sodium: 137 mmol/L (ref 135–145)

## 2024-06-28 LAB — TYPE AND SCREEN
ABO/RH(D): A POS
Antibody Screen: NEGATIVE
Unit division: 0

## 2024-06-28 LAB — BPAM RBC
Blood Product Expiration Date: 202511212359
ISSUE DATE / TIME: 202510272040
Unit Type and Rh: 6200

## 2024-06-28 LAB — GLUCOSE, CAPILLARY: Glucose-Capillary: 89 mg/dL (ref 70–99)

## 2024-06-28 MED ORDER — BUDESONIDE 0.5 MG/2ML IN SUSP
0.5000 mg | Freq: Two times a day (BID) | RESPIRATORY_TRACT | Status: DC
  Administered 2024-06-28 – 2024-07-03 (×10): 0.5 mg via RESPIRATORY_TRACT
  Filled 2024-06-28 (×11): qty 2

## 2024-06-28 NOTE — Consult Note (Signed)
 Opened in error

## 2024-06-28 NOTE — Plan of Care (Signed)
     Referral previously received for [name] for goals of care discussion. Noted most recent palliative in-person assessment dated 06/27/2024 at which time it was recommended to await oncology input involved tomorrow for ongoing GOC.  Today went to the bedside the patient is lying in the bed, resting comfortably.  Her husband is at the bedside.  It appears that oncology seen the patient earlier today, note still pending at this time.  When I introduced myself to the patient's husband explained my role on the healthcare team he states that she does not want to talk today.  She then tells me that she is tired and she does want to talk today.  I asked if he is okay to check back with tomorrow and she states yes.  I show to follow-up tomorrow for ongoing GOC conversations.  Thank you for your referral and allowing [facility] to assist in [name]'s care.   Kristin Kays, NP Palliative Medicine Team Phone: [phone]  NO CHARGE

## 2024-06-28 NOTE — Consult Note (Signed)
 [facility] Consultation Hematology/Oncology  CONSULTING PHYSICIAN:  Eric Nunnery, MD  REASON FOR CONSULT: Metastatic cervical carcinoma    HISTORY OF PRESENT ILLNESS:   Kristin Carson is a 53 y.o. female with past medical history of metastatic cervical carcinoma, metastatic to lungs, liver and peritoneum.  She is currently on fourth line of chemotherapy with single agent gemcitabine.  Patient came to the ER with increasing weakness, shortness of breath, productive cough and pleuritic left-sided chest and back pain.  CT chest done at this time showed no evidence of pulmonary embolism but interval progression of pulmonary metastasis with new pleural-based nodules and masses in both lungs.  Also had mall loculated left-sided pleural effusion concerning for new malignant pleural effusion.  She also had bilateral liver metastasis with interval progression.  Oncology was consulted for further recommendations.  I saw patient at bedside along with her husband.  She reports feeling significantly better since hospitalization while she is on oxygen .  We discussed the CT scan findings in detail and that there is clear progression of her disease.  Patient received 2 cycles of gemcitabine so far.  Plan was to repeat a CT scan in 3 months.  Of note, patient also had a recent lung biopsy done that was consistent with cervical carcinoma.  Patient is aware at this time that she has very poor prognosis and not many treatment options available at this time for her progressive disease.  She would like to continue her treatments at this time and stated that she does not want to talk about it regarding cancer.     MEDICATIONS: I have reviewed the patient's current medications.      PERFORMANCE STATUS: The patient's performance status is 2 - Symptomatic, <50% confined to bed  PHYSICAL EXAM: Most Recent Vital Signs: Blood pressure (!) 134/103, pulse 92, temperature 97.8 F (36.6 C), temperature source Oral,  resp. rate 12, height 6' (1.829 m), weight 270 lb (122.5 kg), SpO2 91%.  GENERAL:alert, no distress and comfortable, oxygen  via nasal cannula. OROPHARYNX:no exudate, no erythema and lips, buccal mucosa, and tongue normal  NECK: supple, thyroid  normal size, non-tender, without nodularity LYMPH:  no palpable lymphadenopathy in the cervical, axillary or inguinal LUNGS: clear to auscultation and percussion with normal breathing effort HEART: regular rate & rhythm and no murmurs and no lower extremity edema ABDOMEN:abdomen soft, non-tender and normal bowel sounds Musculoskeletal:no cyanosis of digits and no clubbing  PSYCH: alert & oriented x 3 with fluent speech  LABORATORY DATA:   Last CBC Lab Results  Component Value Date   WBC 19.5 (H) 06/28/2024   HGB 8.1 (L) 06/28/2024   HCT 24.3 (L) 06/28/2024   MCV 84.4 06/28/2024   MCH 28.1 06/28/2024   RDW 19.7 (H) 06/28/2024   PLT 326 06/28/2024     Last metabolic panel Lab Results  Component Value Date   GLUCOSE 95 06/28/2024   NA 137 06/28/2024   K 4.6 06/28/2024   CL 102 06/28/2024   CO2 27 06/28/2024   BUN 9 06/28/2024   CREATININE 0.86 06/28/2024   GFRNONAA >60 06/28/2024   CALCIUM  8.5 (L) 06/28/2024   PHOS 2.6 06/24/2024   PROT 6.8 06/24/2024   ALBUMIN 3.1 (L) 06/24/2024   BILITOT 0.6 06/24/2024   ALKPHOS 306 (H) 06/24/2024   AST 56 (H) 06/24/2024   ALT 9 06/24/2024   ANIONGAP 7 06/28/2024     RADIOGRAPHY:   EXAM: CTA of the Chest with contrast for PE 06/24/2024 05:07:25 AM  TECHNIQUE: CTA of the chest was performed after the administration of intravenous contrast. Multiplanar reformatted images are provided for review. MIP images are provided for review. Automated exposure control, iterative reconstruction, and/or weight based adjustment of the mA/kV was utilized to reduce the radiation dose to as low as reasonably achievable.   COMPARISON: 06/06/2024   CLINICAL HISTORY: Pulmonary embolism (PE) suspected,  high prob; CP, SOB, metastatic cervical cancer. [facility] from home; Weak 3 days + SOB; Nausea + CP tonight; Stage 4 lung cancer   FINDINGS:   PULMONARY ARTERIES: Pulmonary arteries are adequately opacified for evaluation. No pulmonary embolism. The main pulmonary artery measures 3.4 cm.   MEDIASTINUM: The heart is at the upper limits of normal in size. Coronary artery calcifications are present. Trace pericardial effusion. Prominent mediastinum. There is no acute abnormality of the thoracic aorta.   LYMPH NODES: A left hilar lymph node is identified. No mediastinal or axillary lymphadenopathy.   LUNGS AND PLEURA: Interval progression since the previous exam. There has been interval development of a loculated left pleural effusion overlying the left lung with components extending over the left apex. Signs of pulmonary metastases with multiple pleural-based nodules and masses in both lungs. Index lesion within the lingula is again identified measuring 4.1 x 3.1 cm (image 68/8), previously 4.1 x 3.0 cm. Index lesion within the lateral right middle lobe measures 3.2 x 1.7 cm (image 67/14), previously 2.7 x 1.7 cm. Lesion within the superior segment of the left lower lobe measures 2.8 x 2.4 cm (image 37/14), previously 2.3 x 2.5 cm. Mild emphysema with diffuse bronchial wall thickening.   UPPER ABDOMEN: Bilateral liver metastases are again noted. Dominant lesion within the posteromedial right hepatic lobe measures 7.7 x 5.8 cm (image 109/8), previously 7.2 x 4.9 cm. Lesion within the medial segment of the left lobe of liver measures 6.7 x 6.4 cm (image 110/8), previously 5.8 x 5.5 cm.   SOFT TISSUES AND BONES: No acute bone or soft tissue abnormality.   IMPRESSION: 1. No evidence of pulmonary embolism. 2. Interval progression of pulmonary metastases with multiple pleural-based nodules and masses in both lungs. 3. Interval development of a loculated left pleural effusion. Concerning  for new malignant pleural effusion. 4. Bilateral liver metastases with interval progression. 5. Increased caliber of the main pulmonary artery suggestive of pulmonary artery hypertension. 6. Emphysema and coronary artery calcifications.   Electronically signed by: Waddell Calk MD 06/24/2024 06:30 AM EDT RP Workstation: [id number]   ASSESSMENT:  Patient is a 53 year old female with metastatic cervical cancer on fourth line of treatment with gemcitabine.  PLAN:   Cervical carcinoma: Patient has progressive metastatic cervical carcinoma, currently on fourth line treatment with single agent gemcitabine.  Patient does know of poor prognosis at this time and no further treatment options with a lot of data available at this time.  The patient has no targetable mutations.  Recent CT scan with progressive disease but patient has only received 2 cycles of gemcitabine so far.  Patient would like to continue treatment at this time and is not open to the idea of hospice yet.  I will plan to see the patient in the outpatient setting to further discuss goals of care, provide education regarding the disease process, and support the patient in understanding and coping with the diagnosis.  We will follow-up patient outpatient.  Thank you for involving us  in this patient's care.  Please to reach out with any questions or concerns.  Mickiel Dry, MD Hematology/Oncology [facility]

## 2024-06-28 NOTE — Plan of Care (Signed)
  Problem: Education: Goal: Knowledge of General Education information will improve Description: Including pain rating scale, medication(s)/side effects and non-pharmacologic comfort measures Outcome: Not Progressing   Problem: Health Behavior/Discharge Planning: Goal: Ability to manage health-related needs will improve Outcome: Progressing   Problem: Clinical Measurements: Goal: Ability to maintain clinical measurements within normal limits will improve Outcome: Progressing Goal: Will remain free from infection Outcome: Progressing Goal: Diagnostic test results will improve Outcome: Progressing Goal: Respiratory complications will improve Outcome: Progressing Goal: Cardiovascular complication will be avoided Outcome: Progressing   Problem: Activity: Goal: Risk for activity intolerance will decrease Outcome: Progressing   Problem: Nutrition: Goal: Adequate nutrition will be maintained Outcome: Progressing   Problem: Coping: Goal: Level of anxiety will decrease Outcome: Progressing   Problem: Elimination: Goal: Will not experience complications related to bowel motility Outcome: Progressing Goal: Will not experience complications related to urinary retention Outcome: Progressing   Problem: Pain Managment: Goal: General experience of comfort will improve and/or be controlled Outcome: Progressing   Problem: Safety: Goal: Ability to remain free from injury will improve Outcome: Progressing

## 2024-06-28 NOTE — Progress Notes (Signed)
 Progress Note   Patient: Kristin Carson FMW:[id number] DOB: Sep 13, 1970 DOA: 06/24/2024     3 DOS: the patient was seen and examined on 06/28/2024   Brief hospital admission narrative: As per H&P written by Dr. Willette on 06/24/2024 Kristin Carson is a 15 Y female with 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 Presenting with progressive chest pain.  Reports pain has been increasingly getting worse over the past 3 days, considerable shortness of breath, with occasional cough.  Pain radiates to the left side and back. No report of diaphoresis, fever, chills nausea or vomiting.    ED Evaluation: Blood pressure (!) 122/98, pulse (!) 116, temperature 98.2 F (36.8 C), resp. rate 17, height 5' 1 (1.549 m), weight 74.2 kg, last menstrual period 08/15/2022, SpO2 93%.   Labs: CBC WBC 10.4, hemoglobin 8.0, BMP within normal limits with calcium  of 8.8, glucose 100   CTA chest: Negative for PE, progression of pulmonary metastases, multiple pleural based nodules, loculated left pleural effusion, bilateral liver metastases, pulmonary hypertension.  Emphysema.  CAD.   Patient to be admitted for pain management, possible pleural effusion, palliative consultation, oncology consultation to determine path, goals of care  Patient Denies having: Fever, Chills, Cough, SOB, Abd pain, N/V/D, headache, dizziness, lightheadedness,  Dysuria, Joint pain, rash, open wounds.  Assessment and Plan: * Cervical cancer (HCC) -S/p treatment chemotherapy 3 cycles, -S/p evaluation by pulmonology, bronchoscopy bronchial wash -lesion, cytology consistent with metastatic cancer. - Metastatic disease to liver, peritoneal and and lungs. - Images demonstrating further progression of pulmonary metastasis with multiple new nodules in her pleural space and lung with pleural effusion and pulmonary  hypertension. - Appreciate assistance and recommendation by palliative care. - Oncology service has been consulted and will follow any further inputs. - Will continue supportive care and as needed analgesics.  Acute toxic metabolic encephalopathy, resolved.  Continue pain control with oxycodone  IR and acetaminophen .  Hold on non steroidal antiinflammatory agents due to reduced GFR. Hold on IV hydromorphone , long acting oxycodone  and pregabalin  to avoid encephalopathy.  Overall with very poor prognosis.  - Acute stroke has been ruled out.  Essential hypertension -Continue to hold amlodipine  due to risk of hypotension.    AKI (acute kidney injury) -Hyponatremia, hyperkalemia.  -Worsening renal function with serum cr at 1,41 with K at 5,2 and serum bicarbonate at 28 and Na 132  - Patient received fluid resuscitation - Renal function and electrolytes within normal limits at the moment. - Continue minimizing nephrotoxic agents and avoid hypotension/contrast as much as possible. -Patient advised to maintain adequate oral hydration.  GERD (gastroesophageal reflux disease) -Continue pantoprazole .   Iron deficiency anemia due to chronic blood loss -Hemoglobin down to 6.9; Status post 1 unit PRBCs transfusion hemoglobin up to 8.1. - Patient received IV iron on 06/26/2024 - No overt bleeding appreciated - Continue to follow hemoglobin trend intermittently.  Hypoalbuminemia due to protein-calorie malnutrition -Likely moderate protein calorie malnutrition, consult dietary for supplementation   Goals of care, counseling/discussion -Goals of care discussion and advance care planning ongoing - Overall with very poor prognosis  Tobacco use -Continue nicotine  patch.    Subjective: No chest pain, no nausea, no vomiting, no fever.  Still complaining of ongoing intermittent pleuritic pain.  Good saturation on 2-3 L supplementation.  After transfusion hemoglobin up to 8.1.  Physical  Exam: Vitals:   06/28/24 0018 06/28/24 0500 06/28/24 9482 06/28/24 9082  BP:   112/73   Pulse:   (!) 105   Resp: 19  18   Temp:   98.4 F (36.9 C)   TempSrc:   Oral   SpO2:   96% 96%  Weight:  77 kg    Height:       General exam: Alert, awake, oriented x 3; following commands appropriately.  Denying palpitations.  Patient reports ongoing intermittent pleuritic chest pain. Respiratory system: No wheezing, no crackles; 2-3 L supplementation in place with good saturation.  No using accessory muscles. Cardiovascular system: Sinus tachycardia, no rubs, no gallops, no JVD on exam. Gastrointestinal system: Abdomen is nondistended, soft and nontender. No organomegaly or masses felt. Normal bowel sounds heard. Central nervous system: Moving 4 limbs spontaneously.  No focal neurological deficits. Extremities: No cyanosis or clubbing. Skin: No petechiae. Psychiatry: Judgement and insight appear normal.  Flat affect appreciated on exam.  Latest data Reviewed: CBC: WBC 19.5, hemoglobin 8.1 and platelet count 326K Basic metabolic panel: Sodium 137, potassium 4.6, chloride 102, bicarb 27, BUN 9, creatinine 0.86 and GFR >60   Family Communication: No family at bedside on today's examination; patient provided info/phone number for her sister but unable to reach over the phone.   Disposition: Status is: Inpatient Remains inpatient appropriate because: pending palliative care consultation   Planned Discharge Destination: Home   Author: Eric Nunnery, MD 06/28/2024 9:31 AM  For on call review [email or web address].

## 2024-06-28 NOTE — Plan of Care (Signed)
  Problem: Education: Goal: Knowledge of General Education information will improve Description: Including pain rating scale, medication(s)/side effects and non-pharmacologic comfort measures Outcome: Not Progressing   Problem: Health Behavior/Discharge Planning: Goal: Ability to manage health-related needs will improve Outcome: Not Progressing   Problem: Clinical Measurements: Goal: Ability to maintain clinical measurements within normal limits will improve Outcome: Progressing Goal: Will remain free from infection Outcome: Progressing Goal: Diagnostic test results will improve Outcome: Not Progressing Goal: Respiratory complications will improve Outcome: Not Progressing Goal: Cardiovascular complication will be avoided Outcome: Progressing   Problem: Activity: Goal: Risk for activity intolerance will decrease Outcome: Progressing   Problem: Nutrition: Goal: Adequate nutrition will be maintained Outcome: Progressing   Problem: Coping: Goal: Level of anxiety will decrease Outcome: Not Progressing   Problem: Elimination: Goal: Will not experience complications related to bowel motility Outcome: Progressing Goal: Will not experience complications related to urinary retention Outcome: Progressing   Problem: Pain Managment: Goal: General experience of comfort will improve and/or be controlled Outcome: Progressing   Problem: Safety: Goal: Ability to remain free from injury will improve Outcome: Progressing   Problem: Skin Integrity: Goal: Risk for impaired skin integrity will decrease Outcome: Progressing

## 2024-06-29 DIAGNOSIS — R0781 Pleurodynia: Secondary | ICD-10-CM | POA: Diagnosis not present

## 2024-06-29 DIAGNOSIS — E8809 Other disorders of plasma-protein metabolism, not elsewhere classified: Secondary | ICD-10-CM | POA: Diagnosis not present

## 2024-06-29 DIAGNOSIS — C539 Malignant neoplasm of cervix uteri, unspecified: Secondary | ICD-10-CM | POA: Diagnosis not present

## 2024-06-29 DIAGNOSIS — D500 Iron deficiency anemia secondary to blood loss (chronic): Secondary | ICD-10-CM | POA: Diagnosis not present

## 2024-06-29 DIAGNOSIS — E46 Unspecified protein-calorie malnutrition: Secondary | ICD-10-CM | POA: Diagnosis not present

## 2024-06-29 DIAGNOSIS — C538 Malignant neoplasm of overlapping sites of cervix uteri: Secondary | ICD-10-CM | POA: Diagnosis not present

## 2024-06-29 DIAGNOSIS — C7800 Secondary malignant neoplasm of unspecified lung: Secondary | ICD-10-CM

## 2024-06-29 DIAGNOSIS — C7802 Secondary malignant neoplasm of left lung: Secondary | ICD-10-CM | POA: Diagnosis not present

## 2024-06-29 DIAGNOSIS — N179 Acute kidney failure, unspecified: Secondary | ICD-10-CM | POA: Diagnosis not present

## 2024-06-29 DIAGNOSIS — J90 Pleural effusion, not elsewhere classified: Secondary | ICD-10-CM | POA: Diagnosis not present

## 2024-06-29 DIAGNOSIS — Z7189 Other specified counseling: Secondary | ICD-10-CM | POA: Diagnosis not present

## 2024-06-29 DIAGNOSIS — Z515 Encounter for palliative care: Secondary | ICD-10-CM | POA: Diagnosis not present

## 2024-06-29 LAB — CBC
HCT: 26.3 % — ABNORMAL LOW (ref 36.0–46.0)
Hemoglobin: 8.8 g/dL — ABNORMAL LOW (ref 12.0–15.0)
MCH: 28.2 pg (ref 26.0–34.0)
MCHC: 33.5 g/dL (ref 30.0–36.0)
MCV: 84.3 fL (ref 80.0–100.0)
Platelets: 457 K/uL — ABNORMAL HIGH (ref 150–400)
RBC: 3.12 MIL/uL — ABNORMAL LOW (ref 3.87–5.11)
RDW: 20.3 % — ABNORMAL HIGH (ref 11.5–15.5)
WBC: 18.9 K/uL — ABNORMAL HIGH (ref 4.0–10.5)
nRBC: 0.1 % (ref 0.0–0.2)

## 2024-06-29 NOTE — Plan of Care (Signed)

## 2024-06-29 NOTE — Progress Notes (Signed)
 Progress Note   Patient: Kristin Carson FMW:[id number] DOB: 08/24/1971 DOA: 06/24/2024     4 DOS: the patient was seen and examined on 06/29/2024   Subjective: The patient was seen and examined this morning, no acute distress, laying in bed comfortably Cooperative, satting 94% on room air, tachycardic with heart rate of 110, blood pressure stable at 139/92  Currently she has been refusing her meds,, further vitals continue 60  Brief hospital admission narrative: Kristin Carson is a 71 Y female with 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 Presenting with progressive chest pain.  Reports pain has been increasingly getting worse over the past 3 days, considerable shortness of breath, with occasional cough.  Pain radiates to the left side and back. No report of diaphoresis, fever, chills nausea or vomiting.    ED Evaluation: Blood pressure (!) 122/98, pulse (!) 116, temperature 98.2 F (36.8 C), resp. rate 17, height 5' 1 (1.549 m), weight 74.2 kg, last menstrual period 08/15/2022, SpO2 93%.   Labs: CBC WBC 10.4, hemoglobin 8.0, BMP within normal limits with calcium  of 8.8, glucose 100   CTA chest: Negative for PE, progression of pulmonary metastases, multiple pleural based nodules, loculated left pleural effusion, bilateral liver metastases, pulmonary hypertension.  Emphysema.  CAD.   Patient to be admitted for pain management, possible pleural effusion, palliative consultation, oncology consultation to determine path, goals of care   Assessment and Plan: * Cervical cancer (HCC) -S/p treatment chemotherapy 3 cycles, -S/p evaluation by pulmonology, bronchoscopy bronchial wash -lesion, cytology consistent with metastatic cancer. - Metastatic disease to liver, peritoneal and and lungs. - Images demonstrating further progression of pulmonary metastasis with  multiple new nodules in her pleural space and lung with pleural effusion and pulmonary hypertension.  - Appreciate assistance and recommendation by palliative care... Patient and family requesting full scope of care  - Patient had discussion with oncologist Dr. Davonna  Currently not ready for hospice yet Continue symptomatic management -therapy, as needed analgesics  Acute toxic metabolic encephalopathy, resolved.  Continue pain control with oxycodone  IR and acetaminophen .  Hold on non steroidal antiinflammatory agents due to reduced GFR. Hold on IV hydromorphone , long acting oxycodone  and pregabalin  to avoid encephalopathy.  Overall with very poor prognosis.  - Acute stroke has been ruled out.  Essential hypertension Stable -Continue to hold amlodipine  due to risk of hypotension.    AKI (acute kidney injury) -Hyponatremia, hyperkalemia.  -Worsening renal function with serum cr at 1,41 with K at 5,2 and serum bicarbonate at 28 and Na 132  S/p IV fluids, creatinine improved, at baseline  GERD (gastroesophageal reflux disease) -Continue pantoprazole .   Iron deficiency anemia due to chronic blood loss -Hemoglobin down to 6.9; Status post 1 unit PRBCs transfusion hemoglobin up to 8.1. - Patient received IV iron on 06/26/2024 - No overt bleeding appreciated - Continue to follow hemoglobin trend intermittently.  Hypoalbuminemia due to protein-calorie malnutrition -Likely moderate protein calorie malnutrition, consult dietary for supplementation   Goals of care, counseling/discussion -Goals of care discussion and advance care planning ongoing - Overall with very poor prognosis  Tobacco use -Continue nicotine  patch.     Physical Exam: Vitals:   06/28/24 0917 06/28/24 1327 06/28/24 2005 06/28/24 2011  BP:  114/73 (!) 139/92   Pulse:  (!) 109 (!) 110   Resp:  20 20   Temp:  98.9 F (37.2 C) 98 F (36.7 C)  TempSrc:  Oral Oral   SpO2: 96% 92% 98% 94%  Weight:       Height:       General:  AAO x 3,  cooperative, no distress;   HEENT:  Normocephalic, PERRL, otherwise with in Normal limits   Neuro:  CNII-XII intact. , normal motor and sensation, reflexes intact   Lungs:   Clear to auscultation BL, Respirations unlabored,  No wheezes / crackles  Cardio:    S1/S2, RRR, No murmure, No Rubs or Gallops   Abdomen:  Soft, non-tender, bowel sounds active all four quadrants, no guarding or peritoneal signs.  Muscular  skeletal:  Limited exam -global generalized weaknesses - in bed, able to move all 4 extremities,   2+ pulses,  symmetric, No pitting edema  Skin:  Dry, warm to touch, negative for any Rashes,  Wounds: Please see nursing documentation         Latest data Reviewed:    Latest Ref Rng & Units 06/29/2024    5:02 AM 06/28/2024    5:00 AM 06/27/2024    4:57 AM  CBC  WBC 4.0 - 10.5 K/uL 18.9  19.5  16.1   Hemoglobin 12.0 - 15.0 g/dL 8.8  8.1  6.9   Hematocrit 36.0 - 46.0 % 26.3  24.3  21.6   Platelets 150 - 400 K/uL 457  326  284       Latest Ref Rng & Units 06/28/2024    5:00 AM 06/27/2024    4:57 AM 06/26/2024    5:07 AM  CMP  Glucose 70 - 99 mg/dL 95  889  94   BUN 6 - 20 mg/dL 9  12  13    Creatinine 0.44 - 1.00 mg/dL 9.13  9.06  8.58   Sodium 135 - 145 mmol/L 137  135  132   Potassium 3.5 - 5.1 mmol/L 4.6  4.7  5.2   Chloride 98 - 111 mmol/L 102  101  97   CO2 22 - 32 mmol/L 27  27  28    Calcium  8.9 - 10.3 mg/dL 8.5  8.0  8.4      Family Communication: No family at bedside on today's examination; patient provided info/phone number for her sister but unable to reach over the phone.   Disposition: Status is: Inpatient Remains inpatient appropriate because: pending palliative care consultation  Continue discussion with patient and her husband regarding safe discharge.    Planned Discharge Destination: Home   Author: Adriana DELENA Grams, MD 06/29/2024 11:51 AM  For on call review [email or web address].

## 2024-06-29 NOTE — Progress Notes (Signed)
 Patient very resistive to care refusing all meds and the o2 walking saturation screening. Bath given to patient and bed change completed, Dr. Willette made aware of all of patients refusals.

## 2024-06-29 NOTE — Progress Notes (Signed)
 Daily Progress Note   Date: 06/29/2024   Patient Name: Kristin Carson  DOB: 28-Aug-1971  MRN: [id number]  Age / Sex: 53 y.o., female  Attending Physician: Willette Adriana LABOR, MD Primary Care Physician: [name], FNP Admit Date: 06/24/2024 Length of Stay: 4 days  Reason for Follow-up: Establishing goals of care  Past Medical History:  Diagnosis Date   Anemia    Asthma    Bronchitis    Cancer (HCC)    Cervical   CHF (congestive heart failure) (HCC)    COPD (chronic obstructive pulmonary disease) (HCC)    Depression    Dyspnea    GERD (gastroesophageal reflux disease)    Headache    Hypertension    Port-A-Cath in place 09/25/2022    Subjective:   Subjective: Chart Reviewed. Updates received. Patient Assessed. Created space and opportunity for patient  and family to explore thoughts and feelings regarding current medical situation.  Today's Discussion: Today before meeting with the patient/family, I reviewed the chart notes including nursing notes from yesterday, nursing notes from today.  Later during the day I reviewed nursing note from today and family medicine note from today. I also reviewed vital signs, nursing flowsheets, medication administrations record, labs, and imaging. Labs reviewed include CBC today which shows slight improvement in white count from 19.5-18.9 in the setting of loculated pleural effusion with background worsening metastatic cancer.  Today I saw the patient at bedside, her husband was present as well as her sister.  We reviewed the oncology visit from a couple days ago where and there are not many data driven options remaining.  However, the patient wants to continue full code and full scope of care at this time and continue cancer treatments that she has begun.  She is hopeful for improvement.  She seems a bit reluctant to discuss much.  She has also been reluctant to discuss with the oncologist and has been refusing medications as well.  I offered  that her goals are very clear and so palliative medicine would back off at this time.  The goal is for hospitalist to continue treatment for her to be able to discharge and follow-up with oncology as an outpatient.  I shared that I would keep her in my thoughts and prayers which seemed to provide her some reassurance.  I provided emotional and general support through therapeutic listening, empathy, sharing of stories, and other techniques. I answered all questions and addressed all concerns to the best of my ability.  Review of Systems  Constitutional:  Positive for fatigue.       Notes that she is hungry, eating a fruit cup  Respiratory:  Positive for shortness of breath (Requesting oxygen  at home).   Gastrointestinal:  Negative for abdominal pain, nausea and vomiting.    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  Constitutional:  General: She is not in acute distress.    Appearance: She is ill-appearing.  HENT:     Head: Normocephalic and atraumatic.  Cardiovascular:     Rate and Rhythm: Normal rate.  Pulmonary:     Effort: Pulmonary effort is normal. No respiratory distress.  Abdominal:     General: Abdomen is flat.  Skin:    General: Skin is warm and dry.  Neurological:     General: No focal deficit present.     Mental Status: She is alert and oriented to person, place, and time.  Psychiatric:        Mood and Affect:  Mood normal.        Behavior: Behavior normal.     Palliative Assessment/Data: 60%   Existing Vynca/ACP Documentation: None  Assessment & Plan:   HPI/Patient Profile:  53 y.o. 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  She was admitted on 06/24/2024 with cervical cancer with metastasis to lungs, liver, peritoneum now found to be further progressive status posttreatment of chemotherapy on 3 lines of treatment, acute toxic metabolic encephalopathy, chest pain, AKI, IDA, hypoalbuminemia, and others   Palliative medicine was consulted for GOC conversations.  SUMMARY OF RECOMMENDATIONS   Full code Full scope of care Palliative medicine will back off at this time Please notify us  of any significant clinical change or patient/family request does come back  Symptom Management:  Per primary team Palliative medicine is available to assist as needed  Code Status: Full Code  Prognosis: Unable to determine  Discharge Planning: To Be Determined  Discussed with: Patient, family, medical team, nursing team  Thank you for allowing us  to participate in the care of MARCELINA MCLAURIN PMT will continue to support holistically.  Billing based on MDM: Moderate  Detailed review of medical records (labs, imaging, vital signs), medically appropriate exam, discussed with treatment team, counseling and education to patient, family, & staff, documenting clinical information, medication management, coordination of care  Camellia Kays, NP Palliative Medicine Team  Team Phone # [phone] (Nights/Weekends)  04/30/2021, 8:17 AM

## 2024-06-29 NOTE — Progress Notes (Signed)
   06/29/24 1400  Spiritual Encounters  Type of Visit Follow up  Care provided to: Patient;Significant other (Husband Kristin Carson)  Referral source IDT Rounds  Reason for visit Urgent spiritual support  OnCall Visit No  Spiritual Framework  Presenting Themes Meaning/purpose/sources of inspiration;Goals in life/care;Values and beliefs;Coping tools  Values/beliefs Pt professes Christian faith   Reason for Visit: Though Chaplain is already familiar with Pt from the Cancer Center, I'm  responding to referral from Dr at IDT rounds suggesting Pt  could use an extra layer of support  Description of Visit: Upon entering the room I found Kristin Carson in the bed and her husband Kristin Carson was there as a support person.    I told Kristin Carson I was following up with her looking to see how she is holding up since I have heard reports that she is refusing some medications and treatments, and even refusing to talk with the Palliative provider at this time.  She remarks that she is ok.  Exploring deeper she expresses that right she she feels numb- like I just don't feel anything.  She avoids talking about the cancer at all. I tried to encourage her that she needs to talk with those who can help and she hears that, but she is experiencing significant overwhelm at this time.  Not talking is her way of hiding.  She states that she wants to go home, and that she wants to continue cancer treatments until there is nothing left to do, but she won't say much beyond that.  She began to present as very sleepy at that time and then close her eyes.  I don't know if she is really sleepy from pain meds, or just wanting to escape again.  Husband Kristin Carson was theologically curious, asking religious questions and discussing it with me.  I provided contact information to him for follow up if he remains curious.   Plan of Care: I will plan to follow up with this Pt on a weeklybasis   Kristin Carson, MDiv  Chaplain, [facility] [email or web address] [phone]

## 2024-06-29 NOTE — Progress Notes (Signed)
 Patient refused blood glucose check this AM

## 2024-06-29 NOTE — Progress Notes (Signed)
 Pt refused all meds for this shift as well as vitals and daily weights. Pt displaying increased pain and shortness of breath. Morphine given x 2.

## 2024-06-30 ENCOUNTER — Other Ambulatory Visit: Payer: Self-pay

## 2024-06-30 ENCOUNTER — Inpatient Hospital Stay: Admitting: Licensed Clinical Social Worker

## 2024-06-30 DIAGNOSIS — C787 Secondary malignant neoplasm of liver and intrahepatic bile duct: Secondary | ICD-10-CM | POA: Diagnosis not present

## 2024-06-30 DIAGNOSIS — I493 Ventricular premature depolarization: Secondary | ICD-10-CM | POA: Diagnosis not present

## 2024-06-30 DIAGNOSIS — C7800 Secondary malignant neoplasm of unspecified lung: Secondary | ICD-10-CM | POA: Diagnosis not present

## 2024-06-30 DIAGNOSIS — C538 Malignant neoplasm of overlapping sites of cervix uteri: Secondary | ICD-10-CM

## 2024-06-30 DIAGNOSIS — C7801 Secondary malignant neoplasm of right lung: Secondary | ICD-10-CM | POA: Diagnosis not present

## 2024-06-30 DIAGNOSIS — C7802 Secondary malignant neoplasm of left lung: Secondary | ICD-10-CM | POA: Diagnosis not present

## 2024-06-30 DIAGNOSIS — G620 Drug-induced polyneuropathy: Secondary | ICD-10-CM | POA: Diagnosis not present

## 2024-06-30 DIAGNOSIS — C539 Malignant neoplasm of cervix uteri, unspecified: Secondary | ICD-10-CM | POA: Diagnosis not present

## 2024-06-30 DIAGNOSIS — Z5111 Encounter for antineoplastic chemotherapy: Secondary | ICD-10-CM | POA: Diagnosis not present

## 2024-06-30 DIAGNOSIS — C786 Secondary malignant neoplasm of retroperitoneum and peritoneum: Secondary | ICD-10-CM | POA: Diagnosis not present

## 2024-06-30 DIAGNOSIS — R000 Tachycardia, unspecified: Secondary | ICD-10-CM | POA: Diagnosis not present

## 2024-06-30 LAB — GLUCOSE, CAPILLARY
Glucose-Capillary: 96 mg/dL (ref 70–99)
Glucose-Capillary: 84 mg/dL (ref 70–99)
Glucose-Capillary: 90 mg/dL (ref 70–99)

## 2024-06-30 LAB — MAGNESIUM: Magnesium: 2 mg/dL (ref 1.7–2.4)

## 2024-06-30 MED ORDER — BISACODYL 5 MG PO TBEC: 10.0000 mg | DELAYED_RELEASE_TABLET | Freq: Every day | ORAL | Status: DC

## 2024-06-30 MED ORDER — FERROUS SULFATE 325 (65 FE) MG PO TABS
325.0000 mg | ORAL_TABLET | Freq: Two times a day (BID) | ORAL | Status: DC
  Administered 2024-06-30 – 2024-07-04 (×6): 325 mg via ORAL
  Filled 2024-06-30 (×7): qty 1

## 2024-06-30 NOTE — Progress Notes (Addendum)
 SATURATION QUALIFICATIONS: (This note is used to comply with regulatory documentation for home oxygen )  Patient Saturations on Room Air at Rest = 87%  Patient Saturations on Room Air while Ambulating = 81%  Patient Saturations on 2 Liters of oxygen  while Ambulating = 95%  Please briefly explain why patient needs home oxygen :  To maintain oxygen  saturation above 90%  Damien Silvan, RN

## 2024-06-30 NOTE — Progress Notes (Signed)
   06/30/24 1116  Spiritual Encounters  Type of Visit Follow up  Care provided to: Patient;Significant other  Conversation partners present during encounter Nurse  Referral source Patient request  Reason for visit Urgent spiritual support  OnCall Visit No   Reason for Visit: Pt's husband, Lynwood, came up to the Cancer Center and found me. He was in emotional distress pleading with me and the navigator to get him an appointment to see Dr. Davonna.  Description of Visit: Lynwood began to explain to us  that the Pt is in the process of being discharged and in his words, She's a weak as a dishrag, just laying in bed. Husband reports that Pt soiled herself in the bed as she was too weak to get to the rest room and he is working to clean her up.  He is fearful that she is too weak to go home.  He wanted her admitted to the Cancer Center.  I explained to him that we are a clinic and not equipped for inpatient.   While the navigator send an inbox to the Dr. Davonna I walked the Pt's husband back down to the room.  He continued to be emotional on our way and we stopped several times for support and tears.  Upon arrival in the room I greeted Siani next to the bed and spoke to her.  She claimed to me she was doing good  but I told her she appeared differently and she became emotional. I told her I knew she was hurting and that she was afraid.  I knew that she had refused Goals of Care conversations in the past and I asked her out right if she would be willing to have that conversation with the Palliative team.  She was resistant at first but then tearfully agreed.  I wanted her to be informed and I told her that the topic of hospice might come up and she couldn't shut down if it did.  She initially reacted tearfully, but then did agree.  I left the room because PT needed to come in and do an evaluation.  I told them I would contact the Palliative team to come conversate with them.  I then went to the front desk  and ask the charge RN to reach out to Palliative.  I am also sending a message myself.   Plan of Care: I will plan to follow up with this Pt throughout this hospitalization.   Maude Roll, MDiv  Chaplain, [facility] [email or web address] [phone]

## 2024-06-30 NOTE — Progress Notes (Signed)
 [facility] CSW Progress Note    Interventions: CSW received a call from pt's husband.  Pt is currently in-patient at [facility].  Pt's husband extremely distraught in light of pt's decline and is having difficulty processing the information he is being given by the team.  Per medical record pt was evaluated by PT for discharge recommendations and at husband's request it appears referrals will be sent to SNF.  CSW gently explained to pt that once referrals are sent the SNF will evaluate for rehab potential to decide if she is appropriate for discharge.  CSW encouraged pt's husband to try to focus on one day at a time as he is very overwhelmed presently.  CSW to check in w/ pt's husband tomorrow.        Follow Up Plan:  CSW will follow-up with patient by phone     Kristin JONELLE Manna, LCSW Clinical Social Worker [facility]    Patient is participating in a Managed Medicaid Plan:  Yes

## 2024-06-30 NOTE — Plan of Care (Signed)
     Referral previously received for Kristin Carson for goals of care discussion. Noted most recent palliative in-person assessment dated 06/29/2024 at which time it was recommended to follow from a distance/chart check.   I received a message from the chaplain indicating that he had spoken with the patient and she is open to conversations with palliative medicine around goals of care.  I reviewed the chart and made plans to see the patient later today.  However, shortly after that message I received a follow-up message from oncology stating that she had gone to see the patient and the patient again stated she does not want to talk about goals of care for her cancer.  She is open to ongoing GOC conversations as an outpatient in the oncology clinic.  Appointment has been made for oncology as an outpatient.  At this time patient appears stable, pending discharge today with no desire for ongoing GOC conversations while admitted. No plan for in person follow-up at this time. Please contact the palliative medicine provider on service for any new/urgent needs that require our assistance with this patient.  Thank you for your referral and allowing PMT to assist in Kristin Carson's care.   Camellia Kays, NP Palliative Medicine Team Phone: [phone]  NO CHARGE

## 2024-06-30 NOTE — NC FL2 (Signed)
 [place]  MEDICAID FL2 LEVEL OF CARE FORM     IDENTIFICATION  Patient Name: Kristin Carson Birthdate: 06-22-1971 Sex: female Admission Date (Current Location): 06/24/2024  [place] and [place] Number:  [place] [id number] Facility and Address:  [facility],  [address] [place], [place] [address]      Provider Number: [id number]  Attending Physician Name and Address:  Willette Adriana LABOR, MD  Relative Name and Phone Number:       Current Level of Care: Hospital Recommended Level of Care: Skilled Nursing Facility Prior Approval Number:    Date Approved/Denied:   PASRR Number: pending  Discharge Plan: SNF    Current Diagnoses: Patient Active Problem List   Diagnosis Date Noted   Palliative care by specialist 06/28/2024   AKI (acute kidney injury) 06/26/2024   Chest pain 06/24/2024   Lung mass 05/25/2024   Tobacco use 05/05/2024   Corneal abnormality 01/24/2024   Nausea & vomiting 01/24/2024   Drug-induced liver injury 01/24/2024   Elevated liver enzymes 01/24/2024   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 11/24/2023   Goals of care, counseling/discussion 10/29/2023   Acute respiratory failure with hypoxia (HCC) 10/26/2023   Hypoalbuminemia due to protein-calorie malnutrition 10/26/2023   Lactic acidosis 10/26/2023   Influenza A 10/25/2023   Uterine cancer (HCC) 10/15/2023   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   GERD (gastroesophageal reflux disease) 11/18/2022   Dermatitis 11/18/2022   MDD (major depressive disorder) 11/18/2022   Iron deficiency anemia due to chronic blood loss 10/02/2022   Port-A-Cath in place 09/25/2022   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) 09/19/2022   COVID-19 virus infection 08/28/2022   Essential hypertension 12/10/2016   COPD exacerbation (HCC) 12/10/2016   Cigarette nicotine  dependence with nicotine -induced disorder 12/10/2016   Chronic kidney disease 12/10/2016   SOB (shortness of breath)  04/07/2013    Orientation RESPIRATION BLADDER Height & Weight     Self, Time, Situation, Place  O2 (2.5L) Continent Weight:  (Refused) Height:  5' 1 (154.9 cm)  BEHAVIORAL SYMPTOMS/MOOD NEUROLOGICAL BOWEL NUTRITION STATUS      Continent Diet (See d/c summary)  AMBULATORY STATUS COMMUNICATION OF NEEDS Skin   Extensive Assist Verbally Other (Comment) (scratch marks to back and abdomen)                       Personal Care Assistance Level of Assistance  Bathing, Dressing, Feeding Bathing Assistance: Maximum assistance Feeding assistance: Limited assistance Dressing Assistance: Maximum assistance     Functional Limitations Info  Sight, Hearing, Speech Sight Info: Impaired Hearing Info: Adequate Speech Info: Adequate    SPECIAL CARE FACTORS FREQUENCY  PT (By licensed PT)     PT Frequency: 5x weekly              Contractures      Additional Factors Info  Code Status, Allergies, Psychotropic Code Status Info: Full Allergies Info: Carrot (Daucus Carota)  Lisinopril -hydrochlorothiazide   Ibuprofen   Other  Tramadol   Erythromycin Psychotropic Info: Lyrica          Current Medications (06/30/2024):  This is the current hospital active medication list Current Facility-Administered Medications  Medication Dose Route Frequency Provider Last Rate Last Admin   acetaminophen  (TYLENOL ) tablet 650 mg  650 mg Oral Q6H PRN Willette Adriana A, MD   650 mg at 06/27/24 9191   Or   acetaminophen  (TYLENOL ) suppository 650 mg  650 mg Rectal Q6H PRN Willette Adriana LABOR, MD  alum & mag hydroxide-simeth (MAALOX/MYLANTA) 200-200-20 MG/5ML suspension 15 mL  15 mL Oral Q4H PRN Shahmehdi, Seyed A, MD   15 mL at 06/26/24 2346   budesonide  (PULMICORT ) nebulizer solution 0.5 mg  0.5 mg Nebulization BID Ricky Fines, MD   0.5 mg at 06/30/24 9161   Chlorhexidine  Gluconate Cloth 2 % PADS 6 each  6 each Topical Daily Arrien, Elidia Sieving, MD   6 each at 06/30/24 0908   ferrous sulfate  tablet 325 mg  325 mg Oral BID WC Shahmehdi, Seyed A, MD       heparin  injection 5,000 Units  5,000 Units Subcutaneous Q8H Shahmehdi, Adriana A, MD   5,000 Units at 06/30/24 1334   hydrOXYzine  (ATARAX ) tablet 10 mg  10 mg Oral TID PRN Willette Adriana LABOR, MD   10 mg at 06/30/24 1337   ipratropium (ATROVENT ) nebulizer solution 0.5 mg  0.5 mg Nebulization Q6H PRN Willette Adriana A, MD   0.5 mg at 06/28/24 2011   magnesium  oxide (MAG-OX) tablet 400 mg  400 mg Oral TID Willette Adriana A, MD   400 mg at 06/30/24 0907   metoCLOPramide  (REGLAN ) injection 10 mg  10 mg Intravenous Q6H PRN Shahmehdi, Adriana A, MD       morphine (PF) 2 MG/ML injection 2 mg  2 mg Intravenous Q4H PRN Mansy, Jan A, MD   2 mg at 06/30/24 9762   ondansetron  (ZOFRAN ) tablet 4 mg  4 mg Oral Q6H PRN Shahmehdi, Seyed A, MD   4 mg at 06/30/24 1338   Or   ondansetron  (ZOFRAN ) injection 4 mg  4 mg Intravenous Q6H PRN Willette Adriana A, MD   4 mg at 06/27/24 2055   oxyCODONE  (Oxy IR/ROXICODONE ) immediate release tablet 5 mg  5 mg Oral Q4H PRN Willette Adriana A, MD   5 mg at 06/30/24 1338   pantoprazole  (PROTONIX ) EC tablet 40 mg  40 mg Oral Daily Shahmehdi, Seyed A, MD   40 mg at 06/30/24 9092   senna-docusate (Senokot-S) tablet 1 tablet  1 tablet Oral BID Willette Adriana A, MD   1 tablet at 06/30/24 9092   sodium chloride  flush (NS) 0.9 % injection 10-40 mL  10-40 mL Intracatheter Q12H Arrien, Elidia Sieving, MD   10 mL at 06/30/24 9092   sodium chloride  flush (NS) 0.9 % injection 3 mL  3 mL Intravenous Q12H Shahmehdi, Seyed A, MD   3 mL at 06/30/24 0907   sodium chloride  flush (NS) 0.9 % injection 3 mL  3 mL Intravenous Q12H Shahmehdi, Seyed A, MD   3 mL at 06/30/24 9092   traZODone (DESYREL) tablet 25 mg  25 mg Oral QHS PRN Shahmehdi, Seyed A, MD   25 mg at 06/25/24 2144     Discharge Medications: Please see discharge summary for a list of discharge medications.  Relevant Imaging Results:  Relevant Lab Results:   Additional  Information SSN: [id number]  Mcarthur Saddie Kim, LCSW

## 2024-06-30 NOTE — Significant Event (Signed)
       CROSS COVER NOTE  NAME: Kristin Carson MRN: [id number] DOB : [birth year] ATTENDING PHYSICIAN: Willette Adriana LABOR, MD    Date of Service   06/30/2024   HPI/Events of Note   TRH Cross Cover at [facility] received from nurse heart rate 59 HPI : 53 year old female with metastatic cervical cancer to liver, peritoneum, and lungs, HTN , COPD, and neuropathy  She was admitted to the hospital on 10/24 for progressive chest pain. Imaging showed progression of mets to the lung with development of new modules in the pleural space, pleural effusion and hypertension.  Medical oncology note states patient currently on fourth line of chemo, currently gemcitabine and attempted to discuss with patient today regarding treatment and discuss goals of care.  Also being seen by palliative care.  Patient remains full code at this time  Interventions   Assessment/Plan: Blood pressure 130/81, pulse (!) 145, temperature 99 F (37.2 C), temperature source Oral, resp. rate (!) 24, height 5' 1 (1.549 m), weight 77 kg, SpO2 96%, on 1.5 L supplemental oxygen   General - moderate distress with tachypnea and shortness of breath; generalized weakness; incontinent of urine; endorsed chills today CV EKG ST with occasional PVC. Rate 141; extremities warm x 4 Resp: rate 30 sitting edge of bed; BBS course, faint exp wheeze LLL ; non productive cough Low grade temp does not appear to be new; Leukocytosis  not new and improving from yesterday  EKG ST with Pvcs 141 Initiate cardiac monitoring Monitor fever, if worsens will initiate further workup Mag level at goal of 2 Repeat chest xray in am f/u pleural effusions Continue pulmicort  - xopenex  for wheezing    Erminio LITTIE [facility] NP [facility] Cross Cover 7pm-7am - check amion for availability Pager [phone]

## 2024-06-30 NOTE — TOC Progression Note (Signed)
 Transition of Care [facility]) - Progression Note    Patient Details  Name: JYRA LAGARES MRN: [id number] Date of Birth: June 21, 1971  [facility] CM/SW Contact  Mcarthur Saddie Kim, [place] Phone Number: 06/30/2024, 2:09 PM  Clinical Narrative:  PT recommending SNF. LCSW discussed placement with pt. She is aware placement may be outside of [place] due to insurance. Pt agreeable. Will initiate bed search.      Expected Discharge Plan: Home/Self Care Barriers to Discharge: Continued Medical Work up               Expected Discharge Plan and Services In-house Referral: Clinical Social Work Discharge Planning Services: CM Consult   Living arrangements for the past 2 months: Single Family Home                                       Social Drivers of Health (SDOH) Interventions SDOH Screenings   Food Insecurity: No Food Insecurity (06/24/2024)  Housing: Low Risk  (06/24/2024)  Transportation Needs: No Transportation Needs (06/24/2024)  Utilities: Not At Risk (06/24/2024)  Alcohol  Screen: Low Risk  (09/09/2022)  Depression (PHQ2-9): Low Risk  (06/13/2024)  Recent Concern: Depression (PHQ2-9) - High Risk (04/14/2024)  Financial Resource Strain: Low Risk  (09/09/2022)  Physical Activity: Insufficiently Active (09/09/2022)  Social Connections: Socially Isolated (01/24/2024)  Stress: No Stress Concern Present (09/09/2022)  Tobacco Use: High Risk (06/24/2024)    Readmission Risk Interventions    06/27/2024   11:18 AM 10/26/2023    8:07 AM 08/29/2022    2:18 PM  Readmission Risk Prevention Plan  Transportation Screening Complete Complete Complete  PCP or Specialist Appt within 5-7 Days   Complete  Home Care Screening  Complete Complete  Medication Review (RN CM)  Complete Complete  HRI or Home Care Consult Complete    Social Work Consult for Recovery Care Planning/Counseling Complete    Palliative Care Screening Not Applicable    Medication Review Special Educational Needs Teacher) Complete

## 2024-06-30 NOTE — Progress Notes (Signed)
 Hematology/oncology progress note  Patient is a 53 year old female with metastatic cervical carcinoma, currently on fourth line of chemotherapy with single agent gemcitabine.  She was admitted for progressive chest pain and shortness of breath.  I saw the patient at bedside today.  She was sitting in her bed and looked comfortable.  She reported that she is feeling better than when she came to the hospital.  We rediscussed that her disease is metastatic and has not much for the lines of treatment at this time.  Considering the recent CT scan showed progression, we might have to change treatments or talk about goals of care.  Patient stated that she does not want to talk about cancer at this time and will discuss with me in an outpatient setting.  I see that patient might need SNF placement.  We will make an outpatient oncology appointment to follow-up with me after discharge.  Please to let me know if any questions or concerns.  Mickiel Dry, MD Hematology/Oncology [facility]

## 2024-06-30 NOTE — Evaluation (Addendum)
 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 Presenting with progressive chest pain.  Reports pain has been increasingly getting worse over the past 3 days, considerable shortness of breath, with occasional cough.  Pain radiates to the left side and back.  No report of diaphoresis, fever, chills nausea or vomiting.   Clinical Impression  Patient demonstrates slow labored movement for sitting up at bedside, very unsteady on feet and limited to a few steps at bedside before having to sit due to severe SOB with SpO2 dropping from 86% to 81% while on room air, put back on 2 LPM with SpO2 increasing to 95% while seated at bedside. Patient easily fatigued and tolerated sitting up at bedside with her spouse present. Patient will benefit from continued skilled physical therapy in hospital and recommended venue below to increase strength, balance, endurance for safe ADLs and gait.          If plan is discharge home, recommend the following: A lot of help with bathing/dressing/bathroom;A lot of help with walking and/or transfers;Help with stairs or ramp for entrance;Assist for transportation   Can travel by private vehicle   No    Equipment Recommendations None recommended by PT  Recommendations for Other Services       Functional Status Assessment Patient has had a recent decline in their functional status and demonstrates the ability to make significant improvements in function in a reasonable and predictable amount of time.     Precautions / Restrictions Precautions Precautions: Fall Recall of Precautions/Restrictions: Intact Restrictions Weight Bearing Restrictions Per Provider  Order: No      Mobility  Bed Mobility Overal bed mobility: Needs Assistance Bed Mobility: Supine to Sit     Supine to sit: Supervision, Contact guard     General bed mobility comments: labored movement    Transfers Overall transfer level: Needs assistance Equipment used: Rolling walker (2 wheels) Transfers: Sit to/from Stand, Bed to chair/wheelchair/BSC Sit to Stand: Min assist, Mod assist   Step pivot transfers: Min assist, Mod assist       General transfer comment: slow labored movement, fatigues easily    Ambulation/Gait Ambulation/Gait assistance: Min assist, Mod assist Gait Distance (Feet): 4 Feet Assistive device: Rolling walker (2 wheels) Gait Pattern/deviations: Decreased step length - right, Decreased step length - left, Knee flexed in stance - left, Trunk flexed Gait velocity: slow     General Gait Details: limited to a few steps forward/backwards before having to sit, on room air wth SpO2 at 81%  Stairs            Wheelchair Mobility     Tilt Bed    Modified Rankin (Stroke Patients Only)       Balance Overall balance assessment: Needs assistance Sitting-balance support: Feet supported, No upper extremity supported Sitting balance-Leahy Scale: Fair Sitting balance - Comments: fair/good seated at EOB   Standing balance support: Reliant on assistive device for balance, During functional activity, Bilateral upper extremity supported Standing balance-Leahy Scale: Poor Standing balance comment: fair/poor using RW                             Pertinent Vitals/Pain Pain Assessment Pain Assessment: Faces Faces  Pain Scale: Hurts even more Pain Location: low back Pain Descriptors / Indicators: Discomfort, Grimacing, Sore Pain Intervention(s): Limited activity within patient's tolerance, Monitored during session, Repositioned    Home Living Family/patient expects to be discharged to:: Private residence Living Arrangements:  Spouse/significant other Available Help at Discharge: Family;Available 24 hours/day;Other (Comment) Type of Home: House Home Access: Level entry       Home Layout: One level Home Equipment: Pharmacist, Hospital (2 wheels) Additional Comments: Pt reports that a nurse is present 3pm to 5pm each day other than "sunday to assist her as needed.    Prior Function Prior Level of Function : Needs assist       Physical Assist : ADLs (physical)   ADLs (physical): IADLs;Bathing;Dressing;Toileting Mobility Comments: Independent ambulation without AD in the community. Does not drive. ADLs Comments: PRN assist from nurse for ADL's; assisted IADL's by nurse and spouse.     Extremity/Trunk Assessment   Upper Extremity Assessment Upper Extremity Assessment: Generalized weakness    Lower Extremity Assessment Lower Extremity Assessment: Generalized weakness    Cervical / Trunk Assessment Cervical / Trunk Assessment: Normal  Communication   Communication Communication: No apparent difficulties    Cognition Arousal: Alert Behavior During Therapy: WFL for tasks assessed/performed   PT - Cognitive impairments: No apparent impairments                         Following commands: Intact       Cueing Cueing Techniques: Verbal cues     General Comments      Exercises     Assessment/Plan    PT Assessment Patient needs continued PT services  PT Problem List Decreased strength;Decreased activity tolerance;Decreased balance;Decreased mobility       PT Treatment Interventions Gait training;DME instruction;Stair training;Functional mobility training;Therapeutic activities;Therapeutic exercise;Balance training;Patient/family education    PT Goals (Current goals can be found in the Care Plan section)  Acute Rehab PT Goals Patient Stated Goal: return home with famly to assist PT Goal Formulation: With patient/family Time For Goal Achievement: 07/14/24 Potential to  Achieve Goals: Good    Frequency Min 3X/week     Co-evaluation               AM-PAC PT 6 Clicks Mobility  Outcome Measure Help needed turning from your back to your side while in a flat bed without using bedrails?: A Little Help needed moving from lying on your back to sitting on the side of a flat bed without using bedrails?: A Little Help needed moving to and from a bed to a chair (including a wheelchair)?: A Lot Help needed standing up from a chair using your arms (e.g., wheelchair or bedside chair)?: A Lot Help needed to walk in hospital room?: A Lot Help needed climbing 3-5 steps with a railing? : A Lot 6 Click Score: 14    End of Session Equipment Utilized During Treatment: Oxygen Activity Tolerance: Patient tolerated treatment well;Patient limited by fatigue;Patient limited by pain Patient left: in bed;with call bell/phone within reach;with family/visitor present Nurse Communication: Mobility status PT Visit Diagnosis: Unsteadiness on feet (R26.81);Other abnormalities of gait and mobility (R26.89);Muscle weakness (generalized) (M62.81)    Time: 1101-1135 PT Time Calculation (min) (ACUTE ONLY): 34 min   Charges:   PT Evaluation $PT Eval Moderate Complexity: 1 Mod PT Treatments $Therapeutic Activity: 23-37 mins PT General Charges $$ ACUTE PT VISIT: 1 Visit         12" :17 PM, 06/30/24 Lynwood  Koleen, MPT Physical Therapist with [facility] [phone] office [phone] mobile phone

## 2024-06-30 NOTE — Progress Notes (Signed)
 DOB: 12/24/1970 Date: 06/30/2024   Must ID: [id number]   To Whom it May Concern:  Please be advised that the above named patient will require a short-term nursing home stay- anticipated 30 days or less rehabilitation and strengthening. The plan is for return home.

## 2024-06-30 NOTE — Progress Notes (Signed)
 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 Presenting with progressive chest pain.  Reports pain has been increasingly getting worse over the past 3 days, considerable shortness of breath, with occasional cough.  Pain radiates to the left side and back. No report of diaphoresis, fever, chills nausea or vomiting.    ED Evaluation: Blood pressure (!) 122/98, pulse (!) 116, temperature 98.2 F (36.8 C), resp. rate 17, height 5' 1 (1.549 m), weight 74.2 kg, last menstrual period 08/15/2022, SpO2 93%.   Labs: CBC WBC 10.4, hemoglobin 8.0, BMP within normal limits with calcium  of 8.8, glucose 100   CTA chest: Negative for PE, progression of pulmonary metastases, multiple pleural based nodules, loculated left pleural effusion, bilateral liver metastases, pulmonary hypertension.  Emphysema.  CAD.   Patient to be admitted for pain management, possible pleural effusion, palliative consultation, oncology consultation to determine path, goals of care   ---------------------------------------------------------------------------------------------------------------------------------------------------  Assessment and Plan:  * Cervical cancer (HCC) -S/p treatment chemotherapy 3 cycles, -S/p  evaluation by pulmonology, bronchoscopy bronchial wash -lesion, cytology consistent with metastatic cancer. - Metastatic disease to liver, peritoneal and and lungs. - Images demonstrating further progression of pulmonary metastasis with multiple new nodules in her pleural space and lung with pleural effusion and pulmonary hypertension.  - Appreciate assistance and recommendation by palliative care... Patient and family requesting full scope of care  - Patient had discussion with oncologist Dr. Davonna -continue discussion, patient/husband have poor understanding of progression of her disease -still requesting treatment options  Currently not ready for hospice yet!  Continue symptomatic management -therapy, as needed analgesics  Acute toxic metabolic encephalopathy,  - Medication-induced, improved Continue pain control with oxycodone  IR and acetaminophen .  Hold on non steroidal antiinflammatory agents due to reduced GFR. Hold on IV hydromorphone , long acting oxycodone  and pregabalin  to avoid encephalopathy.  Overall with very poor prognosis.  - Acute stroke has been ruled out.  Essential hypertension -Remains stable -Continue to hold amlodipine  due to risk of hypotension.    AKI (acute kidney injury) -Hyponatremia, hyperkalemia.  -Worsening renal function with serum cr at 1,41 with K at 5,2 and serum bicarbonate at 28 and Na 132  S/p IV fluids, creatinine improved, at baseline  GERD (gastroesophageal reflux disease) -Continue pantoprazole .   Iron deficiency anemia due to chronic blood loss -Hemoglobin down to 6.9; Status post 1 unit PRBCs transfusion hemoglobin up to 8.1. - Patient received IV iron on 06/26/2024, initiating oral iron supplements - No overt bleeding appreciated - Continue to follow hemoglobin trend intermittently.  Hypoalbuminemia due to protein-calorie malnutrition -Likely moderate protein calorie malnutrition, consult dietary for supplementation   Goals of  care, counseling/discussion -Goals of care discussion and advance care planning ongoing -Extensive discussion with RN, hospitalist, oncologist, chaplain, palliative care team Continue have poor grasp-not amendable to palliative care-hospice yet - Overall with very poor prognosis  Tobacco use -  Continue nicotine  patch.     Physical Exam: Blood pressure (!) 140/87, pulse 99, temperature 99.5 F (37.5 C), temperature source Oral, resp. rate 18, height 5' 1 (1.549 m), weight 77 kg, last menstrual period 08/15/2022, SpO2 92%.  General:  AAO x 3,  cooperative, no distress;   HEENT:  Normocephalic, PERRL, otherwise with in Normal limits   Neuro:  CNII-XII intact. , normal motor and sensation, reflexes intact   Lungs:   Clear to auscultation BL, Respirations unlabored,  No wheezes / crackles  Cardio:    S1/S2, RRR, No murmure, No Rubs or Gallops   Abdomen:  Soft, non-tender, bowel sounds active all four quadrants, no guarding or peritoneal signs.  Muscular  skeletal:  Limited exam -global generalized weaknesses - in bed, able to move all 4 extremities,   2+ pulses,  symmetric, No pitting edema  Skin:  Dry, warm to touch, negative for any Rashes,  Wounds: Please see nursing documentation    Latest data Reviewed:    Latest Ref Rng & Units 06/29/2024    5:02 AM 06/28/2024    5:00 AM 06/27/2024    4:57 AM  CBC  WBC 4.0 - 10.5 K/uL 18.9  19.5  16.1   Hemoglobin 12.0 - 15.0 g/dL 8.8  8.1  6.9   Hematocrit 36.0 - 46.0 % 26.3  24.3  21.6   Platelets 150 - 400 K/uL 457  326  284       Latest Ref Rng & Units 06/28/2024    5:00 AM 06/27/2024    4:57 AM 06/26/2024    5:07 AM  CMP  Glucose 70 - 99 mg/dL 95  889  94   BUN 6 - 20 mg/dL 9  12  13    Creatinine 0.44 - 1.00 mg/dL 9.13  9.06  8.58   Sodium 135 - 145 mmol/L 137  135  132   Potassium 3.5 - 5.1 mmol/L 4.6  4.7  5.2   Chloride 98 - 111 mmol/L 102  101  97   CO2 22 - 32 mmol/L 27  27  28    Calcium  8.9 - 10.3 mg/dL 8.5  8.0  8.4       Family Communication: Husband present at bedside, discussed in detail-updated  Disposition: Status is: Inpatient Remains inpatient appropriate because: pending palliative care consultation  Continue discussion with patient and her husband regarding safe discharge.    Planned Discharge Destination: From home, status post evaluation by PT OT, recommending SNF TOC consulted, continue discussion with the patient husband regarding placement to SNF   Pending discharge to SNF, ordered follow-up with palliative care as an outpatient and oncologist as an outpatient appointments have been made   Author: Adriana DELENA Grams, MD 06/30/2024 12:14 PM  Time spent : 55 minutes was spent seeing evaluating this patient coordinating  For on call review [email or web address].

## 2024-06-30 NOTE — Plan of Care (Signed)
  Problem: Acute Rehab PT Goals(only PT should resolve) Goal: Pt Will Go Supine/Side To Sit Outcome: Progressing Flowsheets (Taken 06/30/2024 1210) Pt will go Supine/Side to Sit:  with modified independence  Independently Goal: Patient Will Transfer Sit To/From Stand Outcome: Progressing Flowsheets (Taken 06/30/2024 1210) Patient will transfer sit to/from stand: with contact guard assist Goal: Pt Will Transfer Bed To Chair/Chair To Bed Outcome: Progressing Flowsheets (Taken 06/30/2024 1210) Pt will Transfer Bed to Chair/Chair to Bed: with contact guard assist Goal: Pt Will Ambulate Outcome: Progressing Flowsheets (Taken 06/30/2024 1210) Pt will Ambulate:  25 feet  with moderate assist  with rolling walker   12:10 PM, 06/30/24 Lynwood Music, MPT Physical Therapist with [facility] [phone] office [phone] mobile phone

## 2024-07-01 ENCOUNTER — Inpatient Hospital Stay (HOSPITAL_COMMUNITY)

## 2024-07-01 DIAGNOSIS — C7800 Secondary malignant neoplasm of unspecified lung: Secondary | ICD-10-CM | POA: Diagnosis not present

## 2024-07-01 DIAGNOSIS — C539 Malignant neoplasm of cervix uteri, unspecified: Secondary | ICD-10-CM | POA: Diagnosis not present

## 2024-07-01 LAB — CBC WITH DIFFERENTIAL/PLATELET
Basophils Absolute: 0 K/uL (ref 0.0–0.1)
Basophils Relative: 0 %
Eosinophils Absolute: 0.3 K/uL (ref 0.0–0.5)
Eosinophils Relative: 2 %
HCT: 26.2 % — ABNORMAL LOW (ref 36.0–46.0)
Hemoglobin: 8.3 g/dL — ABNORMAL LOW (ref 12.0–15.0)
Lymphocytes Relative: 8 %
Lymphs Abs: 1.4 K/uL (ref 0.7–4.0)
MCH: 27.3 pg (ref 26.0–34.0)
MCHC: 31.7 g/dL (ref 30.0–36.0)
MCV: 86.2 fL (ref 80.0–100.0)
Monocytes Absolute: 3.5 K/uL — ABNORMAL HIGH (ref 0.1–1.0)
Monocytes Relative: 20 %
Neutro Abs: 12.2 K/uL — ABNORMAL HIGH (ref 1.7–7.7)
Neutrophils Relative %: 70 %
Platelets: 520 K/uL — ABNORMAL HIGH (ref 150–400)
RBC: 3.04 MIL/uL — ABNORMAL LOW (ref 3.87–5.11)
RDW: 20.7 % — ABNORMAL HIGH (ref 11.5–15.5)
Smear Review: NORMAL
WBC: 17.4 K/uL — ABNORMAL HIGH (ref 4.0–10.5)
nRBC: 0 % (ref 0.0–0.2)

## 2024-07-01 LAB — GLUCOSE, CAPILLARY: Glucose-Capillary: 83 mg/dL (ref 70–99)

## 2024-07-01 LAB — PROCALCITONIN: Procalcitonin: 2.71 ng/mL

## 2024-07-01 MED ORDER — NICOTINE 21 MG/24HR TD PT24
21.0000 mg | MEDICATED_PATCH | Freq: Every day | TRANSDERMAL | Status: DC
  Administered 2024-07-01 – 2024-07-04 (×4): 21 mg via TRANSDERMAL
  Filled 2024-07-01 (×4): qty 1

## 2024-07-01 MED ORDER — ALPRAZOLAM 0.25 MG PO TABS
0.2500 mg | ORAL_TABLET | Freq: Three times a day (TID) | ORAL | Status: DC
  Administered 2024-07-01 – 2024-07-02 (×5): 0.25 mg via ORAL
  Filled 2024-07-01 (×5): qty 1

## 2024-07-01 MED ORDER — LEVALBUTEROL HCL 0.63 MG/3ML IN NEBU: 0.6300 mg | INHALATION_SOLUTION | Freq: Four times a day (QID) | RESPIRATORY_TRACT | Status: DC | PRN

## 2024-07-01 MED ORDER — NON FORMULARY: 1.0000 | Freq: Four times a day (QID) | Status: DC | PRN

## 2024-07-01 NOTE — Progress Notes (Signed)
 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 Presenting with progressive chest pain.  Reports pain has been increasingly getting worse over the past 3 days, considerable shortness of breath, with occasional cough.  Pain radiates to the left side and back. No report of diaphoresis, fever, chills nausea or vomiting.    ED Evaluation: Blood pressure (!) 122/98, pulse (!) 116, temperature 98.2 F (36.8 C), resp. rate 17, height 5' 1 (1.549 m), weight 74.2 kg, last menstrual period 08/15/2022, SpO2 93%.   Labs: CBC WBC 10.4, hemoglobin 8.0, BMP within normal limits with calcium  of 8.8, glucose 100   CTA chest: Negative for PE, progression of pulmonary metastases, multiple pleural based nodules, loculated left pleural effusion, bilateral liver metastases, pulmonary hypertension.  Emphysema.  CAD.   Patient to be admitted for pain management, possible pleural effusion, palliative consultation, oncology consultation to determine path, goals of care   ---------------------------------------------------------------------------------------------------------------------------------------------------  Assessment and Plan:  * Cervical cancer (HCC) -advanced with metastases -S/p treatment chemotherapy 3 cycles, -S/p evaluation by pulmonology, bronchoscopy bronchial wash -lesion, cytology consistent with  metastatic cancer. - Metastatic disease to liver, peritoneal and and lungs. - Images demonstrating further progression of pulmonary metastasis with multiple new nodules in her pleural space and lung with pleural effusion and pulmonary hypertension.  - Appreciate assistance and recommendation by palliative care... Patient and family requesting full scope of care  - Patient had discussion with oncologist Dr. Davonna -continue discussion, patient/husband have poor understanding of progression of her disease -still requesting treatment options  Patient is not mentally ready for hospice-continue discussion with palliative care team, oncology Continue symptomatic management -therapy, as needed analgesics  Acute toxic metabolic encephalopathy,  - Medication-induced,  - Husband requesting medication such as Xanax to keep her calm and collected Discussed that that may make her more sleepy, confused-expressed understanding -Adding 0.25 mg of Xanax 3 times daily Continue pain control with oxycodone  IR and acetaminophen .  Hold on non steroidal antiinflammatory agents due to reduced GFR. Hold on IV hydromorphone , long acting oxycodone  and pregabalin  to avoid encephalopathy.  Overall with very poor prognosis.  - Acute stroke has been ruled out.  Essential hypertension hypotensive-  -BP remains soft  - Discontinued BP meds  AKI (acute kidney injury) -Hyponatremia, hyperkalemia.  -Worsening renal function with serum cr at 1,41 with K at 5,2 and serum bicarbonate at 28 and Na 132  S/p IV fluids, creatinine improved, at baseline  GERD (gastroesophageal reflux disease) -Continue pantoprazole .   Iron deficiency anemia due to chronic blood loss -Hemoglobin down to 6.9; Status post 1 unit PRBCs transfusion hemoglobin up to 8.1. - Patient received IV iron on 06/26/2024, initiating oral iron supplements - No overt bleeding appreciated - Continue to follow hemoglobin trend  intermittently.  Hypoalbuminemia due to protein-calorie malnutrition -Likely moderate protein calorie malnutrition, consult dietary for supplementation   Goals of care, counseling/discussion -Goals of care discussion and advance care planning ongoing -Extensive discussion with RN, hospitalist,  oncologist, chaplain, palliative care team Continue have poor grasp-not amendable to palliative care-hospice yet - Overall with very poor prognosis  Tobacco use -Continue nicotine  patch.     Physical Exam: Blood pressure (!) 140/87, pulse 99, temperature 99.5 F (37.5 C), temperature source Oral, resp. rate 18, height 5' 1 (1.549 m), weight 77 kg, last menstrual period 08/15/2022, SpO2 92%.  General:  AAO x 3,  cooperative, no distress;   HEENT:  Normocephalic, PERRL, otherwise with in Normal limits   Neuro:  CNII-XII intact. , normal motor and sensation, reflexes intact   Lungs:   Clear to auscultation BL, Respirations unlabored,  No wheezes / crackles  Cardio:    S1/S2, RRR, No murmure, No Rubs or Gallops   Abdomen:  Soft, non-tender, bowel sounds active all four quadrants, no guarding or peritoneal signs.  Muscular  skeletal:  Limited exam -global generalized weaknesses - in bed, able to move all 4 extremities,   2+ pulses,  symmetric, No pitting edema  Skin:  Dry, warm to touch, negative for any Rashes,  Wounds: Please see nursing documentation        Latest data Reviewed:    Latest Ref Rng & Units 06/29/2024    5:02 AM 06/28/2024    5:00 AM 06/27/2024    4:57 AM  CBC  WBC 4.0 - 10.5 K/uL 18.9  19.5  16.1   Hemoglobin 12.0 - 15.0 g/dL 8.8  8.1  6.9   Hematocrit 36.0 - 46.0 % 26.3  24.3  21.6   Platelets 150 - 400 K/uL 457  326  284       Latest Ref Rng & Units 06/28/2024    5:00 AM 06/27/2024    4:57 AM 06/26/2024    5:07 AM  CMP  Glucose 70 - 99 mg/dL 95  889  94   BUN 6 - 20 mg/dL 9  12  13    Creatinine 0.44 - 1.00 mg/dL 9.13  9.06  8.58   Sodium 135 - 145 mmol/L  137  135  132   Potassium 3.5 - 5.1 mmol/L 4.6  4.7  5.2   Chloride 98 - 111 mmol/L 102  101  97   CO2 22 - 32 mmol/L 27  27  28    Calcium  8.9 - 10.3 mg/dL 8.5  8.0  8.4      Family Communication: Husband present at bedside, discussed in detail-updated  Disposition: Status is: Inpatient Remains inpatient appropriate because: pending palliative care consultation  Continue discussion with patient and her husband regarding safe discharge.    Planned Discharge Destination: From home, status post evaluation by PT OT, recommending SNF TOC consulted, continue discussion with the patient husband regarding placement to SNF   Pending discharge to SNF, ordered follow-up with palliative care as an outpatient and oncologist as an outpatient appointments have been made   Author: Adriana DELENA Grams, MD 07/01/2024 10:05 AM  Time spent : 55 minutes was spent seeing evaluating this patient coordinating  For on call review [email or web address].

## 2024-07-01 NOTE — Progress Notes (Signed)
 OT Cancellation Note  Patient Details Name: Kristin Carson MRN: [id number] DOB: [birth year]   Cancelled Treatment:    Reason Eval/Treat Not Completed: Patient declined, no reason specified. OT arrived in room with PT and PT student, pt sitting up in bed. Pt did not acknowledge therapist, did not answer any questions, except for when asked if she was hurting and she stated yes, would not report where. Attempted to get pt to move to edge of bed, pt grabbed her blankets and pulled them over her stating no. Education provided for importance of participation in therapy if her plan is to go to SNF, pt continues to remain silent with her eyes closed sitting up in bed.   OT will continue to attempt evaluation as pt is agreeable.   Valentin Carson, OTR/L [facility] Acute Rehab Kristin Carson 07/01/2024, 10:44 AM

## 2024-07-01 NOTE — TOC Progression Note (Signed)
 Transition of Care [facility]) - Progression Note    Patient Details  Name: Kristin Carson MRN: [id number] Date of Birth: [birth year]  Transition of Care [facility]) CM/SW Contact  Lucie Lunger, [place] Phone Number: 07/01/2024, 3:41 PM  Clinical Narrative:    CSW spoke to [place] with [facility] who states they are reviewing but will likely be able to accept pt for SNF. CSW spoke to pt at bedside to review, she is agreeable to this. CSW updated Donald that if able to offer pt will accept and CSW requested they can start insurance auth. TOC to follow.   Expected Discharge Plan: Home/Self Care Barriers to Discharge: Continued Medical Work up               Expected Discharge Plan and Services In-house Referral: Clinical Social Work Discharge Planning Services: CM Consult   Living arrangements for the past 2 months: Single Family Home                                       Social Drivers of Health (SDOH) Interventions SDOH Screenings   Food Insecurity: No Food Insecurity (06/24/2024)  Housing: Low Risk  (06/24/2024)  Transportation Needs: No Transportation Needs (06/24/2024)  Utilities: Not At Risk (06/24/2024)  Alcohol  Screen: Low Risk  (09/09/2022)  Depression (PHQ2-9): Low Risk  (06/13/2024)  Recent Concern: Depression (PHQ2-9) - High Risk (04/14/2024)  Financial Resource Strain: Low Risk  (09/09/2022)  Physical Activity: Insufficiently Active (09/09/2022)  Social Connections: Socially Isolated (01/24/2024)  Stress: No Stress Concern Present (09/09/2022)  Tobacco Use: High Risk (06/24/2024)    Readmission Risk Interventions    06/27/2024   11:18 AM 10/26/2023    8:07 AM 08/29/2022    2:18 PM  Readmission Risk Prevention Plan  Transportation Screening Complete Complete Complete  PCP or Specialist Appt within 5-7 Days   Complete  Home Care Screening  Complete Complete  Medication Review (RN CM)  Complete Complete  HRI or Home Care Consult Complete    Social Work  Consult for Recovery Care Planning/Counseling Complete    Palliative Care Screening Not Applicable    Medication Review Ocean[employer]) Complete

## 2024-07-01 NOTE — Plan of Care (Signed)

## 2024-07-02 ENCOUNTER — Other Ambulatory Visit: Payer: Self-pay

## 2024-07-02 DIAGNOSIS — C7800 Secondary malignant neoplasm of unspecified lung: Secondary | ICD-10-CM | POA: Diagnosis not present

## 2024-07-02 DIAGNOSIS — C539 Malignant neoplasm of cervix uteri, unspecified: Secondary | ICD-10-CM | POA: Diagnosis not present

## 2024-07-02 LAB — CBC WITH DIFFERENTIAL/PLATELET
Abs Immature Granulocytes: 0.38 K/uL — ABNORMAL HIGH (ref 0.00–0.07)
Basophils Absolute: 0 K/uL (ref 0.0–0.1)
Basophils Relative: 0 %
Eosinophils Absolute: 0.1 K/uL (ref 0.0–0.5)
Eosinophils Relative: 1 %
HCT: 26.1 % — ABNORMAL LOW (ref 36.0–46.0)
Hemoglobin: 8.5 g/dL — ABNORMAL LOW (ref 12.0–15.0)
Immature Granulocytes: 3 %
Lymphocytes Relative: 14 %
Lymphs Abs: 1.9 K/uL (ref 0.7–4.0)
MCH: 27.4 pg (ref 26.0–34.0)
MCHC: 32.6 g/dL (ref 30.0–36.0)
MCV: 84.2 fL (ref 80.0–100.0)
Monocytes Absolute: 3 K/uL — ABNORMAL HIGH (ref 0.1–1.0)
Monocytes Relative: 21 %
Neutro Abs: 8.8 K/uL — ABNORMAL HIGH (ref 1.7–7.7)
Neutrophils Relative %: 61 %
Platelets: 530 K/uL — ABNORMAL HIGH (ref 150–400)
RBC: 3.1 MIL/uL — ABNORMAL LOW (ref 3.87–5.11)
RDW: 20.1 % — ABNORMAL HIGH (ref 11.5–15.5)
WBC: 14.2 K/uL — ABNORMAL HIGH (ref 4.0–10.5)
nRBC: 0.1 % (ref 0.0–0.2)

## 2024-07-02 LAB — GLUCOSE, CAPILLARY: Glucose-Capillary: 102 mg/dL — ABNORMAL HIGH (ref 70–99)

## 2024-07-02 NOTE — Progress Notes (Signed)
 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 Presenting with progressive chest pain.  Reports pain has been increasingly getting worse over the past 3 days, considerable shortness of breath, with occasional cough.  Pain radiates to the left side and back. No report of diaphoresis, fever, chills nausea or vomiting.    ED Evaluation: Blood pressure (!) 122/98, pulse (!) 116, temperature 98.2 F (36.8 C), resp. rate 17, height 5' 1 (1.549 m), weight 74.2 kg, last menstrual period 08/15/2022, SpO2 93%.   Labs: CBC WBC 10.4, hemoglobin 8.0, BMP within normal limits with calcium  of 8.8, glucose 100   CTA chest: Negative for PE, progression of pulmonary metastases, multiple pleural based nodules, loculated left pleural effusion, bilateral liver metastases, pulmonary hypertension.  Emphysema.  CAD.   Patient to be admitted for pain management, possible pleural effusion, palliative consultation, oncology consultation to determine path, goals of care   ---------------------------------------------------------------------------------------------------------------------------------------------------  Assessment and Plan:  * Cervical cancer (HCC) -advanced with metastases No significant changes, plan of care remain the same -she is to follow-up with Dr. Davonna  -S/p treatment chemotherapy 3 cycles, -S/p evaluation by pulmonology, bronchoscopy bronchial wash -lesion, cytology consistent with metastatic cancer. - Metastatic disease  to liver, peritoneal and and lungs. - Images demonstrating further progression of pulmonary metastasis with multiple new nodules in her pleural space and lung with pleural effusion and pulmonary hypertension.  - Appreciate assistance and recommendation by palliative care... Patient and family requesting full scope of care  - Patient had discussion with oncologist Dr. Davonna -continue discussion, patient/husband have poor understanding of progression of her disease -still requesting treatment options  Patient is not mentally ready for hospice-continue discussion with palliative care team, oncology Continue symptomatic management -therapy, as needed analgesics  Acute toxic metabolic encephalopathy,  -Mentation-improving - Husband requesting medication such as Xanax to keep her calm and collected Discussed that that may make her more sleepy, confused-expressed understanding -Adding 0.25 mg of Xanax 3 times daily Continue pain control with oxycodone  IR and acetaminophen .  Hold on non steroidal antiinflammatory agents due to reduced GFR. Hold on IV hydromorphone , long acting oxycodone  and pregabalin  to avoid encephalopathy.  Overall with very poor prognosis.  - Acute stroke has been ruled out.  Essential hypertension hypotensive-  -BP remains soft  - Discontinued BP meds  AKI (acute kidney injury) -Hyponatremia, hyperkalemia.  -Worsening renal function with serum cr at 1,41 with K at 5,2 and serum bicarbonate at 28 and Na 132  S/p IV fluids, creatinine improved, at baseline  GERD (gastroesophageal reflux disease) -Continue pantoprazole .   Iron deficiency anemia due to chronic blood loss -Hemoglobin down to 6.9; Status post 1 unit PRBCs transfusion  - Patient received IV iron on 06/26/2024, initiating oral iron supplements - No overt bleeding appreciated - Will monitor   Hypoalbuminemia due to protein-calorie malnutrition -Likely moderate protein calorie malnutrition, consult  dietary for supplementation   Goals of care, counseling/discussion -Goals of care discussion and advance care planning ongoing -Extensive discussion with RN, hospitalist, oncologist, chaplain, palliative care team Continue have poor grasp -  not amendable to palliative care or hospice - Overall with very poor prognosis  Tobacco use -Continue nicotine  patch.   Disposition-pending discharge to SNF likely Monday  Physical Exam: Blood pressure 108/78, pulse (!) 114, temperature 98 F (36.7 C), temperature source Oral, resp. rate 18, height 5' 1 (1.549 m), weight 74.7 kg, last menstrual period 08/15/2022, SpO2 98%.   General:  AAO x 3,  cooperative, no distress;   HEENT:  Normocephalic, PERRL, otherwise with in Normal limits   Neuro:  CNII-XII intact. , normal motor and sensation, reflexes intact   Lungs:   Clear to auscultation BL, Respirations unlabored,  No wheezes / crackles  Cardio:    S1/S2, RRR, No murmure, No Rubs or Gallops   Abdomen:  Soft, non-tender, bowel sounds active all four quadrants, no guarding or peritoneal signs.  Muscular  skeletal:  Limited exam -global generalized weaknesses - in bed, able to move all 4 extremities,   2+ pulses,  symmetric, No pitting edema  Skin:  Dry, warm to touch, negative for any Rashes,  Wounds: Please see nursing documentation         Latest data Reviewed:    Latest Ref Rng & Units 07/02/2024    4:01 AM 07/01/2024    9:11 AM 06/29/2024    5:02 AM  CBC  WBC 4.0 - 10.5 K/uL 14.2  17.4  18.9   Hemoglobin 12.0 - 15.0 g/dL 8.5  8.3  8.8   Hematocrit 36.0 - 46.0 % 26.1  26.2  26.3   Platelets 150 - 400 K/uL 530  520  457       Latest Ref Rng & Units 06/28/2024    5:00 AM 06/27/2024    4:57 AM 06/26/2024    5:07 AM  CMP  Glucose 70 - 99 mg/dL 95  889  94   BUN 6 - 20 mg/dL 9  12  13    Creatinine 0.44 - 1.00 mg/dL 9.13  9.06  8.58   Sodium 135 - 145 mmol/L 137  135  132   Potassium 3.5 - 5.1 mmol/L 4.6  4.7  5.2   Chloride  98 - 111 mmol/L 102  101  97   CO2 22 - 32 mmol/L 27  27  28    Calcium  8.9 - 10.3 mg/dL 8.5  8.0  8.4      Family Communication: Husband present at bedside, discussed in detail-updated  Disposition: Status is: Inpatient Remains inpatient appropriate because: pending palliative care consultation  Continue discussion with patient and her husband regarding safe discharge.    Planned Discharge Destination: From home, status post evaluation by PT OT, recommending SNF TOC consulted, continue discussion with the patient husband regarding placement to SNF   Pending discharge to SNF, ordered follow-up with palliative care as an outpatient and oncologist as an outpatient appointments have been made   Author: Adriana DELENA Grams, MD 07/02/2024 12:02 PM  Time spent : 55 minutes was spent seeing evaluating this patient coordinating  For on call review [email or web address].

## 2024-07-02 NOTE — Progress Notes (Addendum)
 Has been weak with transfers and sitting on side of bed.  Unable to stand this morning to use bedside commode.  A little stronger this afternoon when moving from sitting on side of bed to walking to head of bed for lying down. SOB at rest.  Requested pain medication once on day shift.  Family brought pizza for supper and patient ate one piece.  Daughter is interested in having patient transferred to [facility] so she will be closer to her.

## 2024-07-03 ENCOUNTER — Inpatient Hospital Stay (HOSPITAL_COMMUNITY)

## 2024-07-03 DIAGNOSIS — C7800 Secondary malignant neoplasm of unspecified lung: Secondary | ICD-10-CM | POA: Diagnosis not present

## 2024-07-03 DIAGNOSIS — I700 Atherosclerosis of aorta: Secondary | ICD-10-CM | POA: Diagnosis not present

## 2024-07-03 DIAGNOSIS — C539 Malignant neoplasm of cervix uteri, unspecified: Secondary | ICD-10-CM | POA: Diagnosis not present

## 2024-07-03 DIAGNOSIS — J90 Pleural effusion, not elsewhere classified: Secondary | ICD-10-CM | POA: Diagnosis not present

## 2024-07-03 DIAGNOSIS — J9601 Acute respiratory failure with hypoxia: Secondary | ICD-10-CM

## 2024-07-03 DIAGNOSIS — R0602 Shortness of breath: Secondary | ICD-10-CM | POA: Diagnosis not present

## 2024-07-03 DIAGNOSIS — R918 Other nonspecific abnormal finding of lung field: Secondary | ICD-10-CM | POA: Diagnosis not present

## 2024-07-03 DIAGNOSIS — J9602 Acute respiratory failure with hypercapnia: Secondary | ICD-10-CM

## 2024-07-03 DIAGNOSIS — J432 Centrilobular emphysema: Secondary | ICD-10-CM | POA: Diagnosis not present

## 2024-07-03 DIAGNOSIS — G9340 Encephalopathy, unspecified: Secondary | ICD-10-CM

## 2024-07-03 LAB — COMPREHENSIVE METABOLIC PANEL WITH GFR
ALT: <5 U/L (ref 0–44)
AST: 44 U/L — ABNORMAL HIGH (ref 15–41)
Albumin: 2.9 g/dL — ABNORMAL LOW (ref 3.5–5.0)
Alkaline Phosphatase: 287 U/L — ABNORMAL HIGH (ref 38–126)
Anion gap: 12 (ref 5–15)
BUN: 16 mg/dL (ref 6–20)
CO2: 26 mmol/L (ref 22–32)
Calcium: 8.8 mg/dL — ABNORMAL LOW (ref 8.9–10.3)
Chloride: 95 mmol/L — ABNORMAL LOW (ref 98–111)
Creatinine, Ser: 0.81 mg/dL (ref 0.44–1.00)
GFR, Estimated: >60 mL/min (ref >60)
Glucose, Bld: 99 mg/dL (ref 70–99)
Potassium: 4.8 mmol/L (ref 3.5–5.1)
Sodium: 133 mmol/L — ABNORMAL LOW (ref 135–145)
Total Bilirubin: 0.7 mg/dL (ref 0.0–1.2)
Total Protein: 7.1 g/dL (ref 6.5–8.1)

## 2024-07-03 LAB — CBC WITH DIFFERENTIAL/PLATELET
Abs Immature Granulocytes: 0.46 K/uL — ABNORMAL HIGH (ref 0.00–0.07)
Basophils Absolute: 0.1 K/uL (ref 0.0–0.1)
Basophils Relative: 0 %
Eosinophils Absolute: 0 K/uL (ref 0.0–0.5)
Eosinophils Relative: 0 %
HCT: 26.6 % — ABNORMAL LOW (ref 36.0–46.0)
Hemoglobin: 8.5 g/dL — ABNORMAL LOW (ref 12.0–15.0)
Immature Granulocytes: 3 %
Lymphocytes Relative: 6 %
Lymphs Abs: 1 K/uL (ref 0.7–4.0)
MCH: 27.4 pg (ref 26.0–34.0)
MCHC: 32 g/dL (ref 30.0–36.0)
MCV: 85.8 fL (ref 80.0–100.0)
Monocytes Absolute: 3.3 K/uL — ABNORMAL HIGH (ref 0.1–1.0)
Monocytes Relative: 21 %
Neutro Abs: 11 K/uL — ABNORMAL HIGH (ref 1.7–7.7)
Neutrophils Relative %: 70 %
Platelets: 555 K/uL — ABNORMAL HIGH (ref 150–400)
RBC: 3.1 MIL/uL — ABNORMAL LOW (ref 3.87–5.11)
RDW: 20.7 % — ABNORMAL HIGH (ref 11.5–15.5)
WBC: 15.8 K/uL — ABNORMAL HIGH (ref 4.0–10.5)
nRBC: 0.2 % (ref 0.0–0.2)

## 2024-07-03 LAB — LACTIC ACID, PLASMA
Lactic Acid, Venous: 1.7 mmol/L (ref 0.5–1.9)
Lactic Acid, Venous: 1.5 mmol/L (ref 0.5–1.9)

## 2024-07-03 LAB — BLOOD GAS, ARTERIAL
Acid-Base Excess: 0.1 mmol/L (ref 0.0–2.0)
Bicarbonate: 27.6 mmol/L (ref 20.0–28.0)
Drawn by: 27016
FIO2: 36 %
O2 Saturation: 96.2 %
Patient temperature: 37.4
pCO2 arterial: 57 mmHg — ABNORMAL HIGH (ref 32–48)
pH, Arterial: 7.29 — ABNORMAL LOW (ref 7.35–7.45)
pO2, Arterial: 76 mmHg — ABNORMAL LOW (ref 83–108)

## 2024-07-03 LAB — MRSA NEXT GEN BY PCR, NASAL: MRSA by PCR Next Gen: NOT DETECTED

## 2024-07-03 LAB — GLUCOSE, CAPILLARY
Glucose-Capillary: 98 mg/dL (ref 70–99)
Glucose-Capillary: 117 mg/dL — ABNORMAL HIGH (ref 70–99)

## 2024-07-03 LAB — PRO BRAIN NATRIURETIC PEPTIDE: Pro Brain Natriuretic Peptide: 485 pg/mL — ABNORMAL HIGH (ref <300.0)

## 2024-07-03 MED ORDER — LEVOFLOXACIN 750 MG PO TABS
750.0000 mg | ORAL_TABLET | Freq: Every day | ORAL | Status: DC
Start: 2024-07-03 — End: 2024-07-03

## 2024-07-03 MED ORDER — ARFORMOTEROL TARTRATE 15 MCG/2ML IN NEBU
15.0000 ug | INHALATION_SOLUTION | Freq: Two times a day (BID) | RESPIRATORY_TRACT | Status: DC
  Administered 2024-07-03 – 2024-07-05 (×3): 15 ug via RESPIRATORY_TRACT
  Filled 2024-07-03 (×4): qty 2

## 2024-07-03 MED ORDER — FUROSEMIDE 10 MG/ML IJ SOLN
40.0000 mg | Freq: Once | INTRAMUSCULAR | Status: AC
  Administered 2024-07-03: 40 mg via INTRAVENOUS
  Filled 2024-07-03: qty 4

## 2024-07-03 MED ORDER — ALBUTEROL SULFATE (2.5 MG/3ML) 0.083% IN NEBU
INHALATION_SOLUTION | RESPIRATORY_TRACT | Status: AC
  Administered 2024-07-03: 2.5 mg
  Filled 2024-07-03: qty 3

## 2024-07-03 MED ORDER — PIPERACILLIN-TAZOBACTAM 3.375 G IVPB
3.3750 g | Freq: Three times a day (TID) | INTRAVENOUS | Status: DC
  Administered 2024-07-03 – 2024-07-05 (×7): 3.375 g via INTRAVENOUS
  Filled 2024-07-03 (×7): qty 50

## 2024-07-03 MED ORDER — ALPRAZOLAM 0.25 MG PO TABS
0.2500 mg | ORAL_TABLET | Freq: Three times a day (TID) | ORAL | Status: DC | PRN
Start: 2024-07-03 — End: 2024-07-05
  Administered 2024-07-03 – 2024-07-05 (×4): 0.25 mg via ORAL
  Filled 2024-07-03 (×4): qty 1

## 2024-07-03 MED ORDER — IPRATROPIUM-ALBUTEROL 0.5-2.5 (3) MG/3ML IN SOLN: 3.0000 mL | RESPIRATORY_TRACT | Status: DC | PRN

## 2024-07-03 MED ORDER — METHYLPREDNISOLONE SODIUM SUCC 125 MG IJ SOLR
125.0000 mg | Freq: Two times a day (BID) | INTRAMUSCULAR | Status: DC
  Administered 2024-07-03 – 2024-07-04 (×4): 125 mg via INTRAVENOUS
  Filled 2024-07-03 (×4): qty 2

## 2024-07-03 MED ORDER — REVEFENACIN 175 MCG/3ML IN SOLN
175.0000 ug | Freq: Every day | RESPIRATORY_TRACT | Status: DC
  Administered 2024-07-03 – 2024-07-05 (×3): 175 ug via RESPIRATORY_TRACT
  Filled 2024-07-03 (×3): qty 3

## 2024-07-03 NOTE — Progress Notes (Addendum)
 0940(corrected time is 0840) Attempted multiple times to get patient used to wearing BIPAP and she repeatedly removed it from her face stating she can't breathe. The 4 L/m [place] was reapplied to patient.

## 2024-07-03 NOTE — Progress Notes (Signed)
 Progress Note   Patient: Kristin Carson FMW:[id number] DOB: Dec 25, 1970 DOA: 06/24/2024     8 DOS: the patient was seen and examined on 07/03/2024   Subjective:  Rapid response was called by nursing staff: Kristin Carson As she found the patient is difficult to arouse  The patient was seen and examined she was somnolent but arousable, tachypneic, tachycardic but satting 100% on 4 L of oxygen   Patient received Xanax and Atarax  overnight    ------------------------------------------------------------------------------------------------------------------------------------------------------- Brief hospital admission narrative: Kristin Carson is a 68 Y female with 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 Presenting with progressive chest pain.  Reports pain has been increasingly getting worse over the past 3 days, considerable shortness of breath, with occasional cough.  Pain radiates to the left side and back. No report of diaphoresis, fever, chills nausea or vomiting.    ED Evaluation: Blood pressure (!) 122/98, pulse (!) 116, temperature 98.2 F (36.8 C), resp. rate 17, height 5' 1 (1.549 m), weight 74.2 kg, last menstrual period 08/15/2022, SpO2 93%.   Labs: CBC WBC 10.4, hemoglobin 8.0, BMP within normal limits with calcium  of 8.8, glucose 100   CTA chest: Negative for PE, progression of pulmonary metastases, multiple pleural based nodules, loculated left pleural effusion, bilateral liver metastases, pulmonary hypertension.  Emphysema.  CAD.   Patient to be admitted for pain management, possible pleural effusion, palliative consultation, oncology consultation to determine path, goals of  care   ---------------------------------------------------------------------------------------------------------------------------------------------------  Assessment and Plan:  Rapid response -Called by nursing staff due to acute on chronic metabolic encephalopathy Difficult to arouse this morning -Vitals were stable with increased work of breathing, tachypneic, tachycardic, satting 100% on 4 L of oxygen   - Patient was transferred to ICU for close observation - Ordered ABG, Lasix, IV antibiotics, blood cultures, IV steroids, labs  -Chest x-ray reviewed, progressive infiltrate, large left pleural effusion US  thoracentesis-was ordered  Acute on progressive chronic respiratory failure - Due to lung metastasis, left pleural effusion, possible pneumonia - Continue 4 L of oxygen  via nasal cannula, satting 100% ABG-pH 7.29, pCO2 57, pO2 76 - Starting IV antibiotics, IV steroids, nebs - Patient has changed her mind now full code aware that if she gets worse will be intubated  * Cervical cancer (HCC) -advanced with metastases No significant changes, plan of care remain the same -she is to follow-up with Kristin Carson  -S/p treatment chemotherapy 3 cycles, -S/p evaluation by pulmonology, bronchoscopy bronchial wash -lesion, cytology consistent with metastatic cancer. - Metastatic disease to liver, peritoneal and and lungs. - Images demonstrating further progression of pulmonary metastasis with multiple new nodules in her pleural space and lung with pleural effusion and pulmonary hypertension.  - Appreciate assistance and recommendation by palliative care... Patient and family requesting full scope of care  - Patient had discussion with oncologist Kristin Carson -continue discussion, patient/husband have poor understanding of progression of her disease -still requesting treatment options  Patient is not mentally ready for hospice-continue discussion with palliative care team, oncology Continue  symptomatic management -therapy, as needed analgesics  Acute toxic metabolic encephalopathy,  -Continue to have toxic metabolic cephalopathy, worsening lung function, hypoxia, also medication induced Xanax, Atarax  - Per husband continue Xanax as patient is more agitated stressed Discussed that that may make her more sleepy, confused-expressed understanding - Discontinue Atarax   Continue pain control with oxycodone  IR and acetaminophen .  Hold on non steroidal antiinflammatory agents due  to reduced GFR. Hold on IV hydromorphone , long acting oxycodone  and pregabalin  to avoid encephalopathy.  Overall with very poor prognosis.  - Acute stroke has been ruled out.  Essential hypertension hypotensive-soft discontinue BP meds   AKI (acute kidney injury) -Resolved -Hyponatremia, hyperkalemia.... Resolved  GERD (gastroesophageal reflux disease) -Continue pantoprazole .   Iron deficiency anemia due to chronic blood loss -Hemoglobin down to 6.9; Status post 1 unit PRBCs transfusion  - Patient received IV iron on 06/26/2024, initiating oral iron supplements - No overt bleeding appreciated - Will monitor   Hypoalbuminemia due to protein-calorie malnutrition -Likely moderate protein calorie malnutrition, consult dietary for supplementation   Goals of care, counseling/discussion -Goals of care discussion and advance care planning ongoing -Extensive discussion with RN, hospitalist, oncologist, chaplain, palliative care team Continue have poor grasp - not amendable to palliative care or hospice - Overall with very poor prognosis  Tobacco use -Continue nicotine  patch.   Disposition-pending discharge to SNF likely Monday      Physical Exam: Blood pressure 99/72, pulse (!) 117, temperature 98.4 F (36.9 C), temperature source Axillary, resp. rate (!) 53, height 5' 1 (1.549 m), weight 77.5 kg, last menstrual period 08/15/2022, SpO2 100%.      General:  Somnolent, arousable, cooperative   HEENT:  Normocephalic, PERRL, otherwise with in Normal limits   Neuro:  CNII-XII intact. , normal motor and sensation, reflexes intact   Lungs:   Severe diffuse rhonchi, wheezing, lower lobe crackles left > Rt   Cardio:    S1/S2, RRR, No murmure, No Rubs or Gallops   Abdomen:  Soft, non-tender, bowel sounds active all four quadrants, no guarding or peritoneal signs.  Muscular  skeletal:  Limited exam -global generalized weaknesses - in bed, able to move all 4 extremities,   2+ pulses,  symmetric, No pitting edema  Skin:  Dry, warm to touch, negative for any Rashes,  Wounds: Please see nursing documentation             Latest data Reviewed:    Latest Ref Rng & Units 07/03/2024    4:41 AM 07/02/2024    4:01 AM 07/01/2024    9:11 AM  CBC  WBC 4.0 - 10.5 K/uL 15.8  14.2  17.4   Hemoglobin 12.0 - 15.0 g/dL 8.5  8.5  8.3   Hematocrit 36.0 - 46.0 % 26.6  26.1  26.2   Platelets 150 - 400 K/uL 555  530  520       Latest Ref Rng & Units 07/03/2024    8:27 AM 06/28/2024    5:00 AM 06/27/2024    4:57 AM  CMP  Glucose 70 - 99 mg/dL 99  95  889   BUN 6 - 20 mg/dL 16  9  12    Creatinine 0.44 - 1.00 mg/dL 9.18  9.13  9.06   Sodium 135 - 145 mmol/L 133  137  135   Potassium 3.5 - 5.1 mmol/L 4.8  4.6  4.7   Chloride 98 - 111 mmol/L 95  102  101   CO2 22 - 32 mmol/L 26  27  27    Calcium  8.9 - 10.3 mg/dL 8.8  8.5  8.0   Total Protein 6.5 - 8.1 g/dL 7.1     Total Bilirubin 0.0 - 1.2 mg/dL 0.7     Alkaline Phos 38 - 126 U/L 287     AST 15 - 41 U/L 44     ALT 0 - 44 U/L <5  Family Communication: Husband present at bedside, discussed in detail-updated  Disposition: Status is: Inpatient Remains inpatient appropriate because: pending palliative care consultation  Continue discussion with patient and her husband regarding safe discharge.    Planned Discharge Destination: From home, status post evaluation by PT OT, recommending SNF TOC consulted, continue discussion with the  patient husband regarding placement to SNF   Pending discharge to SNF, ordered follow-up with palliative care as an outpatient and oncologist as an outpatient appointments have been made   Author: Adriana DELENA Grams, MD 07/03/2024 10:39 AM  Critical care time time spent : 55 minutes was spent seeing evaluating this patient coordinating  For on call review [email or web address].

## 2024-07-03 NOTE — Progress Notes (Signed)
 Patient had an unrestful night as of last night. Pt was awake for the later part of the night and at one point had pulled her oxygen  off, was laying half way down in the bed with her clothing off c/o being hot. The shift began with the patient talking with staff, but getting very SOB upon doing so. Pts breathing noted to be more rapid this am, as she is sitting upright in her bed, refuses to relax and rest her head back on her pillows and has both fist balled up and pushed into the mattress as if she is going to launch forward off the bed, even though she hasn't attempted to get OOB unassisted. Crackles noted upon expiration. RT made aware and requested for a neb tx which was helpful. Breathing very much improved after tx but is still not the best. Pts HR has been running tachy all night with HR ranging from 120-140s. NP was informed of this and updated on the pts HR and current status. NP stated she thought the pts HR increase was due to her increased pleural effusions which were evident on the patients latest chest xray as of 07/01/2024 and did not want to order any medication at this time. NP did state to notify her if the pts HR got above 150bpm and sustained, however pts HR remained within the range noted above in this note. Medicated with pain medication during the night, nausea med and medication for sleep and itching. Refused purewick and wants to try to get up to [facility]. Bed alarm on and active. Call light within reach.

## 2024-07-03 NOTE — Progress Notes (Signed)
   07/03/24 0730  Vitals  BP 102/70  MAP (mmHg) 80  BP Location Left Arm  BP Method Automatic  Patient Position (if appropriate) Lying  Pulse Rate (!) 117  Resp (!) 52  Level of Consciousness  Level of Consciousness Alert  MEWS COLOR  MEWS Score Color Red  Oxygen  Therapy  SpO2 91 %  O2 Device Nasal Cannula  O2 Flow Rate (L/min) 4 L/min  MEWS Score  MEWS Temp 0  MEWS Systolic 0  MEWS Pulse 2  MEWS RR 3  MEWS LOC 0  MEWS Score 5   No improvement in pt condition, less responsive. RR remains 52/min and labored. Rapid response called, MD Shahmehdi to room to evaluate pt. RT in and scheduled neb started. Orders received to move pt to stepdown unit. AC aware.

## 2024-07-03 NOTE — Progress Notes (Signed)
 Order for continuous bipap for patient due to increased work of breathing. Respiratory rate ranging from 40s-50s. Patient placed on bipap by respiratory therapist but patient would not wear the bipap mask. Dr. Willette made ware. Patient continues to have increased work of breathing with high respiratory rates. Lasix IV and solu-medrol  IV given.

## 2024-07-03 NOTE — Progress Notes (Signed)
   07/03/24 0659  Vitals  BP 98/64  MAP (mmHg) 73  BP Location Left Arm  BP Method Automatic  Patient Position (if appropriate) Lying  Pulse Rate (!) 115  Resp (!) 52  Level of Consciousness  Level of Consciousness Alert  MEWS COLOR  MEWS Score Color Red  Oxygen  Therapy  SpO2 94 %  O2 Device Nasal Cannula  O2 Flow Rate (L/min) 4 L/min  Patient Activity (if Appropriate) In bed  Pain Assessment  Pain Scale PAINAD  Pain Score 2  PAINAD (Pain Assessment in Advanced Dementia)  Breathing 2  Negative Vocalization 0  Facial Expression 0  Body Language 0  Consolability 0  PAINAD Score 2  MEWS Score  MEWS Temp 0  MEWS Systolic 1  MEWS Pulse 2  MEWS RR 3  MEWS LOC 0  MEWS Score 6   Pt lying in bed with eyes closed, opens eyes to name called but no verbal response from pt. Resp labored @ 52/min, wet sounding, especially right chest. Congested cough, non-productive. SaO2 as above. HR 115/min, RRR, no murmur noted. Peripheral pulses +1 x4, cap refill < 3 sec. Pt repositioned with HOB up, encouraged deep breath and cough but pt not following commands. MD Shahmehdi notified of current condition & VS via [facility] page.

## 2024-07-03 NOTE — Progress Notes (Signed)
 CT scan ordered for patient. Patient unable to lay flat in bed at this time due to dyspnea. Dr. Willette made aware. Portable chest xray ordered.

## 2024-07-03 NOTE — Progress Notes (Signed)
 Patient maintaining respiratory rates in the higher 40s. Unchanged since earlier this morning. Patient tried again on the bipap with respiratory therapist. Respiratory therapist sat in room with patient and talked her through wearing the bipap. Patient only tolerated the bipap for about 5 minutes. Daughter in room with patient trying to help also. Patient states she is claustrophobic and wants something to help her relax. Dr. Willette made aware.

## 2024-07-03 NOTE — Plan of Care (Signed)

## 2024-07-03 NOTE — Progress Notes (Signed)
 1349 Scheduled nebulizer treatment applied and family arrived with food and patient removed the nebulizer. RT will attempt to administer treatment when patient is finished eating.

## 2024-07-03 NOTE — Progress Notes (Signed)
 NO xopenex  available in pts drawer . Albuterol  used.

## 2024-07-03 NOTE — Progress Notes (Signed)
 Patient and patient's daughter requesting patient's husband, Lynwood Mulch, to not visit patient. Front desk made aware.

## 2024-07-03 NOTE — Consult Note (Addendum)
 TELE-PCCM CONSULT This consult was provided via telemedicine with 2-way video and audio communication. Patient admitted to [facility] ICU I was in [place] at [facility]  NAME:  KAILENE STEINHART, MRN:  [id number], DOB:  [birth year], LOS: 8 ADMISSION DATE:  06/24/2024, CONSULTATION DATE:  11/2 REFERRING MD:  Willette- TRH, CHIEF COMPLAINT:  hypoxia   History of Present Illness:  Ms. 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 Since admission she has required 2-4L O2. She has wheezing, cough with white sputum. She has failed to improve while hospitalized. Per report from primary team, she has wheezing, rhonchi, and rhales. She has not had a thoracentesis previously due to insufficient fluid.    Pertinent  Medical History  Cervical cancer with mets to liver, peritoneum, lungs Peripheral neuropathy COPD HTN  Significant Hospital Events: Including procedures, antibiotic start and stop dates in addition to other pertinent events   10/24 admitted 11/2 started zosyn, steriods  Interim History / Subjective:    Objective   Blood pressure 103/70, pulse (!) 117, temperature 98.4 F (36.9 C), temperature source Axillary, resp. rate (!) 54, height 5' 1 (1.549 m), weight 77.5 kg, last menstrual period 08/15/2022, SpO2 100%.        Intake/Output Summary (Last 24 hours) at 07/03/2024 1230 Last data filed at 07/03/2024 9092 Gross per 24 hour  Intake 372.37 ml  Output 300 ml  Net 72.37 ml   Filed Weights   07/03/24 0500 07/03/24 0700 07/03/24 0825  Weight: 73.6 kg 79.7 kg 77.5 kg    Examination: General: chronically ill appearing elderly woman sitting up in bed tachypnea but speaking in full sentences, no accessory muscle use or distress.  Awake, answering questions appropriately  Lab/imaging review:  CT chest personally reviewed> extensive metastatic  disease in L>R pleural space CXR personally reviewed> opacified lower hemithorax-- tumor vs effusion or both.  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 Chemotherapy would be the treatment for this, and she is currently too ill for chemo. Would have to defer additional recommendations regarding this to Oncology.   Rest of care per primary.  Daughter was at bedside during tele consult.   Best Practice (right click and Reselect all SmartList Selections daily)   Per primary  Labs   CBC: Recent Labs  Lab 06/28/24 0500 06/29/24 0502 07/01/24 0911 07/02/24 0401 07/03/24 0441  WBC 19.5* 18.9* 17.4* 14.2* 15.8*  NEUTROABS  --   --  12.2* 8.8* 11.0*  HGB 8.1* 8.8* 8.3* 8.5* 8.5*  HCT 24.3*  26.3* 26.2* 26.1* 26.6*  MCV 84.4 84.3 86.2 84.2 85.8  PLT 326 457* 520* 530* 555*    Basic Metabolic Panel: Recent Labs  Lab 06/27/24 0457 06/28/24 0500 06/30/24 2125 07/03/24 0827  NA 135 137  --  133*  K 4.7 4.6  --  4.8  CL 101 102  --  95*  CO2 27 27  --  26  GLUCOSE 110* 95  --  99  BUN 12 9  --   16  CREATININE 0.93 0.86  --  0.81  CALCIUM  8.0* 8.5*  --  8.8*  MG  --   --  2.0  --    GFR: Estimated Creatinine Clearance: 77.4 mL/min (by C-G formula based on SCr of 0.81 mg/dL). Recent Labs  Lab 06/29/24 0502 07/01/24 0911 07/02/24 0401 07/03/24 0441 07/03/24 0827 07/03/24 1028  PROCALCITON  --  2.71  --   --   --   --   WBC 18.9* 17.4* 14.2* 15.8*  --   --   LATICACIDVEN  --   --   --   --  1.7 1.5    Liver Function Tests: Recent Labs  Lab 07/03/24 0827  AST 44*  ALT <5  ALKPHOS 287*  BILITOT 0.7  PROT 7.1  ALBUMIN 2.9*   No results for input(s): LIPASE, AMYLASE in the last 168 hours. No results for input(s): AMMONIA in the last 168 hours.  ABG    Component Value Date/Time   PHART 7.29 (L) 07/03/2024 0745   PCO2ART 57 (H) 07/03/2024 0745   PO2ART 76 (L) 07/03/2024 0745   HCO3 27.6 07/03/2024 0745   O2SAT 96.2 07/03/2024 0745     Coagulation Profile: No results for input(s): INR, PROTIME in the last 168 hours.  Cardiac Enzymes: No results for input(s): CKTOTAL, CKMB, CKMBINDEX, TROPONINI in the last 168 hours.  HbA1C: Hgb A1c MFr Bld  Date/Time Value Ref Range Status  08/29/2022 04:07 AM 5.3 4.8 - 5.6 % Final    Comment:    (NOTE)         Prediabetes: 5.7 - 6.4         Diabetes: >6.4         Glycemic control for adults with diabetes: <7.0   11/25/2016 10:53 AM 5.0 <5.7 % Final    Comment:      For the purpose of screening for the presence of diabetes:   <5.7%       Consistent with the absence of diabetes 5.7-6.4 %   Consistent with increased risk for diabetes (prediabetes) >=6.5 %     Consistent with diabetes   This assay result is consistent with a decreased risk of diabetes.   Currently, no consensus exists regarding use of hemoglobin A1c for diagnosis of diabetes in children.   According to American Diabetes Association (ADA) guidelines, hemoglobin A1c <7.0% represents optimal control in non-pregnant diabetic  patients. Different metrics may apply to specific patient populations. Standards of Medical Care in Diabetes (ADA).       CBG: Recent Labs  Lab 06/30/24 1619 06/30/24 2033 07/01/24 1304 07/02/24 0727 07/03/24 0717  GLUCAP 84 90 83 102* 98    Review of Systems:   Review of Systems  Constitutional:  Negative for fever.  Respiratory:  Positive for cough, sputum production, shortness of breath and wheezing.   Cardiovascular:  Positive for chest pain.     Past Medical History:  She,  has a past medical history of Anemia, Asthma, Bronchitis, Cancer (  HCC), CHF (congestive heart failure) (HCC), COPD (chronic obstructive pulmonary disease) (HCC), Depression, Dyspnea, GERD (gastroesophageal reflux disease), Headache, Hypertension, and Port-A-Cath in place (09/25/2022).   Surgical History:   Past Surgical History:  Procedure Laterality Date   BREAST SURGERY     CESAREAN SECTION     IR IMAGING GUIDED PORT INSERTION  09/30/2022   LEG SURGERY     VIDEO BRONCHOSCOPY WITH ENDOBRONCHIAL NAVIGATION Bilateral 06/07/2024   Procedure: VIDEO BRONCHOSCOPY WITH ENDOBRONCHIAL NAVIGATION;  Surgeon: Isadora Hose, MD;  Location: [facility] ORS;  Service: Pulmonary;  Laterality: Bilateral;     Social History:   reports that she has been smoking cigarettes. She started smoking about 5 weeks ago. She has a 0.1 pack-year smoking history. She has never used smokeless tobacco. She reports current alcohol  use. She reports current drug use. Drug: Marijuana.   Family History:  Her family history includes Cancer in her father and mother; Dermatomyositis in her mother; Heart disease in her father; Hypertension in her mother; Lung cancer in her maternal uncle and paternal uncle. There is no history of Colon cancer, Breast cancer, Ovarian cancer, Endometrial cancer, Pancreatic cancer, or Prostate cancer.   Allergies Allergies  Allergen Reactions   Carrot [Daucus Carota] Hives   Lisinopril -Hydrochlorothiazide       Oral swelling   Ibuprofen  Other (See Comments)    Oral swelling   Other Itching and Other (See Comments)    Hair dye, blisters, pus-filled, soreness   Tramadol  Hives   Erythromycin Hives     Home Medications  Prior to Admission medications   Medication Sig Start Date End Date Taking? Authorizing Provider  albuterol  (VENTOLIN  HFA) 108 (90 Base) MCG/ACT inhaler Inhale 2 puffs into the lungs every 6 (six) hours as needed for wheezing or shortness of breath. 05/25/24  Yes Dgayli, Hose, MD  amLODipine  (NORVASC ) 10 MG tablet Take 1 tablet (10 mg total) by mouth daily. 05/30/24  Yes Davonna Siad, MD  budesonide -formoterol  (SYMBICORT ) 160-4.5 MCG/ACT inhaler Inhale 2 puffs into the lungs in the morning and at bedtime. 05/25/24  Yes Dgayli, Hose, MD  magnesium  oxide (MAG-OX) 400 (240 Mg) MG tablet Take 1 tablet (400 mg total) by mouth 3 (three) times daily. 03/24/24  Yes [name], MD  oxyCODONE  (OXY IR/ROXICODONE ) 5 MG immediate release tablet Take 1 tablet (5 mg total) by mouth every 6 (six) hours as needed for severe pain (pain score 7-10). 05/27/24  Yes Burns, Delon BRAVO, NP  pregabalin  (LYRICA ) 200 MG capsule Take 1 capsule (200 mg total) by mouth 3 (three) times daily. 05/27/24  Yes Geofm Delon BRAVO, NP  Tiotropium Bromide Monohydrate  (SPIRIVA  RESPIMAT) 2.5 MCG/ACT AERS Inhale 2 puffs into the lungs daily. 05/25/24  Yes Dgayli, Hose, MD  prochlorperazine  (COMPAZINE ) 10 MG tablet Take 1 tablet (10 mg total) by mouth every 6 (six) hours as needed. Patient not taking: Reported on 06/07/2024 05/30/24   Davonna Siad, MD     Total time:      Leita SHAUNNA Gaskins, DO 07/03/24 12:30 PM [facility]  For contact information, see Amion. If no response to pager, please call PCCM consult pager. After hours, 7PM- 7AM, please call Elink.

## 2024-07-03 NOTE — Progress Notes (Signed)
 Pt moved to ICU bed 250-04. When asked if she wanted me to notify her husband of the move to ICU, pt stated, Yes. Attempted notification x3 to both numbers listed but no response. Called pt's sister Woodie Lesches as listed in chart and updated on current condition and location.

## 2024-07-03 NOTE — Progress Notes (Addendum)
 Patient with rectal temp of 95. Warm blankets placed on patient. Patient with decreased mentation. Opens eyes to sternal rub and stated stop when rectal probe was inserted. Blood sugar checked: 117. Patient placed on bipap while mentation decreased.

## 2024-07-04 ENCOUNTER — Inpatient Hospital Stay (HOSPITAL_COMMUNITY)

## 2024-07-04 ENCOUNTER — Encounter (HOSPITAL_COMMUNITY): Payer: Self-pay | Admitting: Family Medicine

## 2024-07-04 DIAGNOSIS — J90 Pleural effusion, not elsewhere classified: Secondary | ICD-10-CM | POA: Diagnosis not present

## 2024-07-04 DIAGNOSIS — C539 Malignant neoplasm of cervix uteri, unspecified: Secondary | ICD-10-CM | POA: Diagnosis not present

## 2024-07-04 DIAGNOSIS — C801 Malignant (primary) neoplasm, unspecified: Secondary | ICD-10-CM | POA: Diagnosis not present

## 2024-07-04 DIAGNOSIS — C7800 Secondary malignant neoplasm of unspecified lung: Secondary | ICD-10-CM | POA: Diagnosis not present

## 2024-07-04 DIAGNOSIS — C7801 Secondary malignant neoplasm of right lung: Secondary | ICD-10-CM | POA: Diagnosis not present

## 2024-07-04 LAB — COMPREHENSIVE METABOLIC PANEL WITH GFR
ALT: 7 U/L (ref 0–44)
AST: 39 U/L (ref 15–41)
Albumin: 2.9 g/dL — ABNORMAL LOW (ref 3.5–5.0)
Alkaline Phosphatase: 256 U/L — ABNORMAL HIGH (ref 38–126)
Anion gap: 8 (ref 5–15)
BUN: 18 mg/dL (ref 6–20)
CO2: 31 mmol/L (ref 22–32)
Calcium: 8.7 mg/dL — ABNORMAL LOW (ref 8.9–10.3)
Chloride: 94 mmol/L — ABNORMAL LOW (ref 98–111)
Creatinine, Ser: 1.04 mg/dL — ABNORMAL HIGH (ref 0.44–1.00)
GFR, Estimated: >60 mL/min (ref >60)
Glucose, Bld: 165 mg/dL — ABNORMAL HIGH (ref 70–99)
Potassium: 3.9 mmol/L (ref 3.5–5.1)
Sodium: 133 mmol/L — ABNORMAL LOW (ref 135–145)
Total Bilirubin: 0.6 mg/dL (ref 0.0–1.2)
Total Protein: 7 g/dL (ref 6.5–8.1)

## 2024-07-04 LAB — BODY FLUID CELL COUNT WITH DIFFERENTIAL
Lymphs, Fluid: 13 %
Neutrophil Count, Fluid: 87 % — ABNORMAL HIGH (ref 0–25)
Total Nucleated Cell Count, Fluid: >10000 uL — ABNORMAL HIGH (ref 0–1000)

## 2024-07-04 LAB — CBC WITH DIFFERENTIAL/PLATELET
Abs Immature Granulocytes: 0.14 K/uL — ABNORMAL HIGH (ref 0.00–0.07)
Basophils Absolute: 0 K/uL (ref 0.0–0.1)
Basophils Relative: 0 %
Eosinophils Absolute: 0 K/uL (ref 0.0–0.5)
Eosinophils Relative: 0 %
HCT: 27.3 % — ABNORMAL LOW (ref 36.0–46.0)
Hemoglobin: 8.8 g/dL — ABNORMAL LOW (ref 12.0–15.0)
Immature Granulocytes: 1 %
Lymphocytes Relative: 9 %
Lymphs Abs: 1 K/uL (ref 0.7–4.0)
MCH: 27.3 pg (ref 26.0–34.0)
MCHC: 32.2 g/dL (ref 30.0–36.0)
MCV: 84.8 fL (ref 80.0–100.0)
Monocytes Absolute: 0.9 K/uL (ref 0.1–1.0)
Monocytes Relative: 8 %
Neutro Abs: 9.2 K/uL — ABNORMAL HIGH (ref 1.7–7.7)
Neutrophils Relative %: 82 %
Platelets: 544 K/uL — ABNORMAL HIGH (ref 150–400)
RBC: 3.22 MIL/uL — ABNORMAL LOW (ref 3.87–5.11)
RDW: 20.4 % — ABNORMAL HIGH (ref 11.5–15.5)
WBC: 11.2 K/uL — ABNORMAL HIGH (ref 4.0–10.5)
nRBC: 0.4 % — ABNORMAL HIGH (ref 0.0–0.2)

## 2024-07-04 LAB — BLOOD CULTURE ID PANEL (REFLEXED) - BCID2

## 2024-07-04 LAB — GLUCOSE, CAPILLARY: Glucose-Capillary: 156 mg/dL — ABNORMAL HIGH (ref 70–99)

## 2024-07-04 MED ORDER — LIDOCAINE HCL (PF) 2 % IJ SOLN
10.0000 mL | Freq: Once | INTRAMUSCULAR | Status: AC
  Administered 2024-07-04: 10 mL

## 2024-07-04 MED ORDER — BUPROPION HCL 100 MG PO TABS
100.0000 mg | ORAL_TABLET | Freq: Two times a day (BID) | ORAL | Status: DC
  Administered 2024-07-04: 100 mg via ORAL
  Filled 2024-07-04 (×5): qty 1

## 2024-07-04 MED ORDER — HEPARIN SOD (PORK) LOCK FLUSH 100 UNIT/ML IV SOLN: 500.0000 [IU] | INTRAVENOUS | Status: DC | PRN

## 2024-07-04 MED ORDER — LIDOCAINE HCL (PF) 2 % IJ SOLN
INTRAMUSCULAR | Status: AC
  Filled 2024-07-04: qty 10

## 2024-07-04 NOTE — Progress Notes (Addendum)
 PHARMACY - PHYSICIAN COMMUNICATION CRITICAL VALUE ALERT - BLOOD CULTURE IDENTIFICATION (BCID)  Kristin Carson is an 53 y.o. female who presented to [facility] on 06/24/2024   Assessment:  E. Coli bacteremia   Current antibiotics: Zosyn  Changes to prescribed antibiotics recommended:  current antibiotics are likely to cover the isolated organism.  Consider de-escalation   Results for orders placed or performed during the hospital encounter of 06/24/24  Blood Culture ID Panel (Reflexed) (Collected: 07/03/2024  8:27 AM)  Result Value Ref Range   Enterococcus faecalis NOT DETECTED NOT DETECTED   Enterococcus Faecium NOT DETECTED NOT DETECTED   Listeria monocytogenes NOT DETECTED NOT DETECTED   Staphylococcus species NOT DETECTED NOT DETECTED   Staphylococcus aureus (BCID) NOT DETECTED NOT DETECTED   Staphylococcus epidermidis NOT DETECTED NOT DETECTED   Staphylococcus lugdunensis NOT DETECTED NOT DETECTED   Streptococcus species DETECTED (A) NOT DETECTED   Streptococcus agalactiae NOT DETECTED NOT DETECTED   Streptococcus pneumoniae DETECTED (A) NOT DETECTED   Streptococcus pyogenes NOT DETECTED NOT DETECTED   A.calcoaceticus-baumannii NOT DETECTED NOT DETECTED   Bacteroides fragilis NOT DETECTED NOT DETECTED   Enterobacterales NOT DETECTED NOT DETECTED   Enterobacter cloacae complex NOT DETECTED NOT DETECTED   Escherichia coli NOT DETECTED NOT DETECTED   Klebsiella aerogenes NOT DETECTED NOT DETECTED   Klebsiella oxytoca NOT DETECTED NOT DETECTED   Klebsiella pneumoniae NOT DETECTED NOT DETECTED   Proteus species NOT DETECTED NOT DETECTED   Salmonella species NOT DETECTED NOT DETECTED   Serratia marcescens NOT DETECTED NOT DETECTED   Haemophilus influenzae NOT DETECTED NOT DETECTED   Neisseria meningitidis NOT DETECTED NOT DETECTED   Pseudomonas aeruginosa NOT DETECTED NOT DETECTED   Stenotrophomonas maltophilia NOT DETECTED NOT DETECTED   Candida albicans NOT DETECTED NOT  DETECTED   Candida auris NOT DETECTED NOT DETECTED   Candida glabrata NOT DETECTED NOT DETECTED   Candida krusei NOT DETECTED NOT DETECTED   Candida parapsilosis NOT DETECTED NOT DETECTED   Candida tropicalis NOT DETECTED NOT DETECTED   Cryptococcus neoformans/gattii NOT DETECTED NOT DETECTED    Elspeth JAYSON Sour 07/04/2024  12:57 PM

## 2024-07-04 NOTE — Progress Notes (Signed)
 OT Cancellation Note  Patient Details Name: MARCEDES TECH MRN: [id number] DOB: June 25, 1971   Cancelled Treatment:    Reason Eval/Treat Not Completed: Medical issues which prohibited therapy. Pt has been transferred to the ICU over the weekend due to a decline in status. She remains resistant to all medical care. OT will sign off at this time and await new orders pending medical readiness.   Valentin Nightingale, OTR/[facility] Pola Furno Elane Nightingale 07/04/2024, 10:49 AM

## 2024-07-04 NOTE — Progress Notes (Signed)
 Patient's family was able to come and calm patient down. Patient was agreeable to being connected back to the monitor. While connecting patient back up, this nurse noticed patient's port had apparently been deaccessed when patient was ripping the EKG leads off. This nurse reaccessed patient's port with family at bedside. Patient is at this moment cooperative, laying in bed, talking to family.
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
## 2024-07-04 NOTE — Progress Notes (Signed)
   07/04/24 1411  Spiritual Encounters  Type of Visit Follow up  Care provided to: Patient  Referral source Chaplain assessment  Reason for visit Urgent spiritual support  OnCall Visit No   Returning to visit with Pt as I had seen her earlier but then family arrived.  As I approached the room prior to entering I overheard Pt and CSW speaking and Pt ws rejecting the skilled nursing facility bed being offered to her.  When I entered the room to talk with Kristin Carson, I asked her why she rejected the room.  She became emotional.  As we talked to Pt expressed to me that she understands that her time is short and she has things at home she wants to do/take care of before she dies.  She's afraid that if she doesn't leave now she will only get worse and not be able to go home again at all.  It seems very important to her to take care of her car she has to get rid of and see grandchildren one more time and have a cigarette.  She doesn't believe that anyone really understands what she is going through and feels alone and misunderstood.    I will continue to follow up with Pt.  If I am needed at any point feel free to call my cell [phone]  Kristin Carson, MDiv Chaplain, [facility] [name] [phone]

## 2024-07-04 NOTE — Progress Notes (Signed)
 Unable to do a thorough assessment due to patient being restless and constantly stating I'm going home. Patient states to just leave her alone and get a wheelchair. Educated patient on the importance of staying here today and getting her thoracentesis.

## 2024-07-04 NOTE — Plan of Care (Signed)
  Problem: Clinical Measurements: Goal: Cardiovascular complication will be avoided Outcome: Progressing   Problem: Clinical Measurements: Goal: Respiratory complications will improve Outcome: Not Progressing Goal: Cardiovascular complication will be avoided Outcome: Progressing   Problem: Coping: Goal: Level of anxiety will decrease Outcome: Not Progressing

## 2024-07-04 NOTE — Progress Notes (Signed)
 Patient tolerated left Thoracentesis procedure well today in ICU room and 350 mL of brown colored pleural fluid removed and sent to lab for processing. Patient and family present verbalized understanding of post procedure instructions and portable chest xray ordered to be done in the ICU room. Patient had no acute distress noted post procedure.

## 2024-07-04 NOTE — TOC Progression Note (Signed)
 Transition of Care [facility]) - Progression Note    Patient Details  Name: Kristin Carson MRN: [id number] Date of Birth: 02-24-1971  [facility]) CM/SW Contact  Lucie Lunger, [place] Phone Number: 07/04/2024, 3:08 PM  Clinical Narrative:    CSW updated by Donald with [facility] that pts insurance shara has been approved for SNF at this time. CSW updated MD, will try for D/C tmrw. TOC to follow.   Expected Discharge Plan: Home/Self Care Barriers to Discharge: Continued Medical Work up               Expected Discharge Plan and Services In-house Referral: Clinical Social Work Discharge Planning Services: CM Consult   Living arrangements for the past 2 months: Single Family Home                                       Social Drivers of Health (SDOH) Interventions SDOH Screenings   Food Insecurity: No Food Insecurity (06/24/2024)  Housing: Low Risk  (06/24/2024)  Transportation Needs: No Transportation Needs (06/24/2024)  Utilities: Not At Risk (06/24/2024)  Alcohol  Screen: Low Risk  (09/09/2022)  Depression (PHQ2-9): Low Risk  (06/13/2024)  Recent Concern: Depression (PHQ2-9) - High Risk (04/14/2024)  Financial Resource Strain: Low Risk  (09/09/2022)  Physical Activity: Insufficiently Active (09/09/2022)  Social Connections: Socially Isolated (01/24/2024)  Stress: No Stress Concern Present (09/09/2022)  Tobacco Use: High Risk (06/24/2024)    Readmission Risk Interventions    07/03/2024   12:57 PM 06/27/2024   11:18 AM 10/26/2023    8:07 AM  Readmission Risk Prevention Plan  Transportation Screening Complete Complete Complete  Home Care Screening   Complete  Medication Review (RN CM)   Complete  HRI or Home Care Consult  Complete   Social Work Consult for Recovery Care Planning/Counseling  Complete   Palliative Care Screening  Not Applicable   Medication Review Oceanographer) Complete Complete   HRI or Home Care Consult Complete    SW Recovery  Care/Counseling Consult Complete    Palliative Care Screening Not Applicable    Skilled Nursing Facility Complete

## 2024-07-04 NOTE — Progress Notes (Signed)
 Progress Note   Patient: Kristin Carson FMW:[id number] DOB: October 12, 1970 DOA: 06/24/2024     9 DOS: the patient was seen and examined on 07/04/2024    Subjective:  The patient was seen and examined this morning, sitting up in a chair in respite distress On 5 L of oxygen , satting 97% Awake alert oriented x 4 -distress would like to leave AMA due to social issue with her former husband   At bedside, stressed that patient needs to stay as she is not medically stable we need to do a thoracentesis   ------------------------------------------------------------------------------------------------------------------------------------------------------- Brief hospital admission narrative: Kristin Carson is a 12 Y female with 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 Presenting with progressive chest pain.  Reports pain has been increasingly getting worse over the past 3 days, considerable shortness of breath, with occasional cough.  Pain radiates to the left side and back. No report of diaphoresis, fever, chills nausea or vomiting.    ED Evaluation: Blood pressure (!) 122/98, pulse (!) 116, temperature 98.2 F (36.8 C), resp. rate 17, height 5' 1 (1.549 m), weight 74.2 kg, last menstrual period 08/15/2022, SpO2 93%.   Labs: CBC WBC 10.4, hemoglobin 8.0, BMP within normal limits with calcium  of 8.8, glucose 100   CTA chest: Negative for PE, progression of pulmonary metastases, multiple pleural based nodules, loculated left pleural effusion, bilateral liver metastases, pulmonary hypertension.  Emphysema.  CAD.   Patient to be admitted for pain management, possible pleural effusion, palliative consultation, oncology consultation to determine path, goals of  care   ---------------------------------------------------------------------------------------------------------------------------------------------------  Assessment and Plan:  Acute hypoxic respiratory failure -left pleural effusion -Due to metastatic cancer, left pleural effusion -Continue high flow oxygen , currently on 5 L of oxygen , satting 97%, visibly shortness of breath -Continue empiric antibiotics -CT scan of the chest was reviewed in detail progressive large left pleural effusion, metastatic cancer and changes are noted -Pulmonary critical care was consulted, following the patient closely -[place] the care, patient is not a good candidate for intubation, or further intervention as her lung condition is not reversible  -Discussed with patient and her daughter at bedside - Recommended to complete thoracentesis - before leaving AMA  ABG-pH 7.29, pCO2 57, pO2 76 - Starting IV antibiotics, IV steroids, Nebs - Patient has changed her mind now full code aware that if she gets worse will be intubated  * Cervical cancer (HCC) -advanced with metastases No significant changes, plan of care remain the same -she is to follow-up with Dr. Davonna  -S/p treatment chemotherapy 3 cycles, -S/p evaluation by pulmonology, bronchoscopy bronchial wash -lesion, cytology consistent with metastatic cancer. - Metastatic disease to liver, peritoneal and and lungs. - Images demonstrating further progression of pulmonary metastasis with multiple new nodules in her pleural space and lung with pleural effusion and pulmonary hypertension.  - Appreciate assistance and recommendation by palliative care... Patient and family requesting full scope of care  - Patient had discussion with oncologist Dr. Davonna -continue discussion, patient/husband have poor understanding of progression of her disease -still requesting treatment options  Patient is not mentally ready for hospice-continue discussion with  palliative care team, oncology Continue symptomatic management -therapy, as needed analgesics  Acute toxic metabolic encephalopathy,  -Continue to have toxic metabolic cephalopathy, worsening lung function, hypoxia, also medication induced Xanax, Atarax  - Discussed with patient and her daughter at bedside--due to her stress anxiety from metastatic cancer Medications were supposed  to help but does make her confused. - Discontinue Atarax   Continue pain control with oxycodone  IR and acetaminophen .   - Hold on IV hydromorphone , long acting oxycodone  and pregabalin  to avoid encephalopathy.  - Overall with very poor prognosis.  - Acute stroke has been ruled out.  Essential hypertension hypotensive-soft discontinue BP meds   AKI (acute kidney injury) -Resolved  Hyponatremia, hyperkalemia.... Resolved  GERD (gastroesophageal reflux disease) -Continue pantoprazole .   Iron deficiency anemia due to chronic blood loss -Hemoglobin down to 6.9; Status post 1 unit PRBCs transfusion  - Patient received IV iron on 06/26/2024, initiating oral iron supplements - No overt bleeding appreciated - Will monitor   Hypoalbuminemia due to protein-calorie malnutrition -Likely moderate protein calorie malnutrition, consult dietary for supplementation   Goals of care, counseling/discussion -Goals of care discussion and advance care planning ongoing -Extensive discussion with RN, hospitalist, oncologist, chaplain, palliative care team Continue have poor grasp - not amendable to palliative care or hospice - Overall with very poor prognosis  Tobacco use -Continue nicotine  patch.   Disposition-pending discharge to SNF likely Monday   Patient is attempting to leave AGAINST MEDICAL ADVICE - Recommended patient to stay for at least thoracentesis so she can breathe  better   ----------------------------------------------------------------------------------------------------------------------------------------------------- Physical Exam: Blood pressure 99/72, pulse (!) 117, temperature 98.4 F (36.9 C), temperature source Axillary, resp. rate (!) 53, height 5' 1 (1.549 m), weight 77.5 kg, last menstrual period 08/15/2022, SpO2 100%.    General:  AAO x 3,  cooperative, no distress;   HEENT:  Normocephalic, PERRL, otherwise with in Normal limits   Neuro:  CNII-XII intact. , normal motor and sensation, reflexes intact   Lungs:   Clear to auscultation BL, Respirations unlabored,  No wheezes / crackles  Cardio:    S1/S2, RRR, No murmure, No Rubs or Gallops   Abdomen:  Soft, non-tender, bowel sounds active all four quadrants, no guarding or peritoneal signs.  Muscular  skeletal:  Limited exam -global generalized weaknesses - in bed, able to move all 4 extremities,   2+ pulses,  symmetric, No pitting edema  Skin:  Dry, warm to touch, negative for any Rashes,  Wounds: Please see nursing documentation     Latest data Reviewed:    Latest Ref Rng & Units 07/04/2024    4:11 AM 07/03/2024    4:41 AM 07/02/2024    4:01 AM  CBC  WBC 4.0 - 10.5 K/uL 11.2  15.8  14.2   Hemoglobin 12.0 - 15.0 g/dL 8.8  8.5  8.5   Hematocrit 36.0 - 46.0 % 27.3  26.6  26.1   Platelets 150 - 400 K/uL 544  555  530       Latest Ref Rng & Units 07/04/2024    4:11 AM 07/03/2024    8:27 AM 06/28/2024    5:00 AM  CMP  Glucose 70 - 99 mg/dL 834  99  95   BUN 6 - 20 mg/dL 18  16  9    Creatinine 0.44 - 1.00 mg/dL 8.95  9.18  9.13   Sodium 135 - 145 mmol/L 133  133  137   Potassium 3.5 - 5.1 mmol/L 3.9  4.8  4.6   Chloride 98 - 111 mmol/L 94  95  102   CO2 22 - 32 mmol/L 31  26  27    Calcium  8.9 - 10.3 mg/dL 8.7  8.8  8.5   Total Protein 6.5 - 8.1 g/dL 7.0  7.1    Total Bilirubin  0.0 - 1.2 mg/dL 0.6  0.7    Alkaline Phos 38 - 126 U/L 256  287    AST 15 - 41 U/L 39  44    ALT  0 - 44 U/L 7  <5       Family Communication: Daughter present at bedside Discussed with the patient daughter and nursing staff at bedside.  Recommending patient to stay for thoracentesis  Disposition: Status is: Inpatient Remains inpatient appropriate because: pending palliative care consultation  Continue discussion with patient and her husband regarding safe discharge.    Planned Discharge Destination: From home, status post evaluation by PT OT, recommending SNF TOC consulted, continue discussion with the patient husband regarding placement to SNF   Pending discharge to SNF, ordered follow-up with palliative care as an outpatient and oncologist as an outpatient appointments have been made. Patient is attempting to leave AMA  Author: Adriana DELENA Grams, MD 07/04/2024 11:28 AM  Critical care time time spent : 55 minutes was spent seeing evaluating this patient coordinating  For on call review [email or web address].

## 2024-07-04 NOTE — Progress Notes (Addendum)
 Pt still in the chair with chair alarm on. Refused to get back in the bed, refusing warming blankets and blood pressure taking too. Was able to use the [facility] and able to get back in the chair as requested safely with 2 max assist.

## 2024-07-04 NOTE — Plan of Care (Signed)
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
## 2024-07-04 NOTE — Progress Notes (Signed)
 Chest CT resulted and made MD aware and review

## 2024-07-04 NOTE — Procedures (Addendum)
 PROCEDURE SUMMARY:  Successful image-guided left thoracentesis. Yielded 350 milliliters of turbid light brown fluid - unable to remove further fluid due to loculations. Patient tolerated procedure well. EBL < 1 mL  No immediate complications.  Specimen was sent for labs. Post procedure CXR shows no pneumothorax.  Please see imaging section of Epic for full dictation.  Clotilda DELENA Hesselbach PA-C 07/04/2024 1:01 PM

## 2024-07-04 NOTE — Progress Notes (Signed)
 PT Cancellation Note  Patient Details Name: Kristin Carson MRN: [id number] DOB: 1971/02/25   Cancelled Treatment:    Reason Eval/Treat Not Completed: Medical issues which prohibited therapy. Patient transferred to a higher level of care and will need new PT consult to resume therapy when patient is medically stable.  Thank you.   8:40 AM, 07/04/24 Lynwood Music, MPT Physical Therapist with [facility] [phone] office [phone] mobile phone

## 2024-07-04 NOTE — Progress Notes (Signed)
 Patient said can you unhook me from all this stuff. I told patient as long as she is still in the ICU, she needs to stay connected to our EKG leads and pulse ox. Patient then began to rip the pulse ox off of her finger and took the EKG leads off.
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
## 2024-07-04 NOTE — Progress Notes (Signed)
 This RN was told by CN that pt is wanting to leave. Went to bedside and assess pt, pt expressed I want to go home Explained the risks of leaving the facility against medical advice.Tried calling her daughter 3x and pt asked to call another number but could not reached them. Asked the pt if she thinks she needs some medicine to help her relax and calm down and she agreed to take her Xanax PRN as ordered. Dr. Manfred informed and went to bedside to talk to the patient. Pt calm down for a little bit then this RN attended to another bed alarm from pt's room and pt telling I want to go home, removing her contraptions and trying to get out of the bed. This RN assisted pt safely to sit in the side of the bed and called CN. CN informed AC and went to bedside to try to de-escalate. This RN and AC was able to transfer pt in the recliner as requested after pt talked to her sister in the phone. Pt now comfortable sitting in the recliner eating her bowl of fruits.

## 2024-07-04 NOTE — Progress Notes (Signed)
 Patient yelling out help. Patient continues to state she wants to go home and to take off the monitoring equipment. Patient wanting to leave AMA. Patient has been educated multiple times by multiple staff members that patient is too unstable and needs to stay. We have requested that patient at least stay to have thoracentesis prior to leaving AMA.

## 2024-07-04 NOTE — Progress Notes (Signed)
 Upon shift assessment. Patient is resting and family member at bedside. Kellogg RN

## 2024-07-04 NOTE — Progress Notes (Signed)
 Patient is being very belligerent. She will not keep the EKG leads on. She is constantly ripping everything off. When asked why she is ripping everything off, patient does not answer. This nurse asked patient if we could come to an agreement with each other so we could hook her EKG leads back up. Patient's temperature is 94.3 axillary and patient will not stay in the bed long enough to get warm by the blanketrol device. Patient began to yell and state get the f*uck away from me. I told patient as long as she is in the hospital, we have to monitor her and make sure she is safe. Patient continued to rip off EKG leads, pulse ox, and IV tubing. Patient is in room yelling while no one is in there. Daughter was called and said she could come in a few mins. Attempted to call sister with no luck.

## 2024-07-05 ENCOUNTER — Inpatient Hospital Stay: Attending: Hematology | Admitting: Licensed Clinical Social Worker

## 2024-07-05 DIAGNOSIS — C538 Malignant neoplasm of overlapping sites of cervix uteri: Secondary | ICD-10-CM

## 2024-07-05 LAB — COMPREHENSIVE METABOLIC PANEL WITH GFR
ALT: 7 U/L (ref 0–44)
AST: 36 U/L (ref 15–41)
Albumin: 2.6 g/dL — ABNORMAL LOW (ref 3.5–5.0)
Alkaline Phosphatase: 232 U/L — ABNORMAL HIGH (ref 38–126)
Anion gap: 7 (ref 5–15)
BUN: 21 mg/dL — ABNORMAL HIGH (ref 6–20)
CO2: 34 mmol/L — ABNORMAL HIGH (ref 22–32)
Calcium: 8.7 mg/dL — ABNORMAL LOW (ref 8.9–10.3)
Chloride: 97 mmol/L — ABNORMAL LOW (ref 98–111)
Creatinine, Ser: 1.09 mg/dL — ABNORMAL HIGH (ref 0.44–1.00)
GFR, Estimated: >60 mL/min (ref >60)
Glucose, Bld: 114 mg/dL — ABNORMAL HIGH (ref 70–99)
Potassium: 4.4 mmol/L (ref 3.5–5.1)
Sodium: 138 mmol/L (ref 135–145)
Total Bilirubin: 0.5 mg/dL (ref 0.0–1.2)
Total Protein: 6.5 g/dL (ref 6.5–8.1)

## 2024-07-05 LAB — CBC WITH DIFFERENTIAL/PLATELET
Abs Immature Granulocytes: 0.13 K/uL — ABNORMAL HIGH (ref 0.00–0.07)
Basophils Absolute: 0 K/uL (ref 0.0–0.1)
Basophils Relative: 0 %
Eosinophils Absolute: 0 K/uL (ref 0.0–0.5)
Eosinophils Relative: 0 %
HCT: 24.6 % — ABNORMAL LOW (ref 36.0–46.0)
Hemoglobin: 8 g/dL — ABNORMAL LOW (ref 12.0–15.0)
Immature Granulocytes: 1 %
Lymphocytes Relative: 8 %
Lymphs Abs: 0.9 K/uL (ref 0.7–4.0)
MCH: 26.8 pg (ref 26.0–34.0)
MCHC: 32.5 g/dL (ref 30.0–36.0)
MCV: 82.6 fL (ref 80.0–100.0)
Monocytes Absolute: 0.9 K/uL (ref 0.1–1.0)
Monocytes Relative: 9 %
Neutro Abs: 8.7 K/uL — ABNORMAL HIGH (ref 1.7–7.7)
Neutrophils Relative %: 82 %
Platelets: 522 K/uL — ABNORMAL HIGH (ref 150–400)
RBC: 2.98 MIL/uL — ABNORMAL LOW (ref 3.87–5.11)
RDW: 20 % — ABNORMAL HIGH (ref 11.5–15.5)
WBC: 10.7 K/uL — ABNORMAL HIGH (ref 4.0–10.5)
nRBC: 0.4 % — ABNORMAL HIGH (ref 0.0–0.2)

## 2024-07-05 LAB — PATHOLOGIST SMEAR REVIEW

## 2024-07-05 MED ORDER — HEPARIN SOD (PORK) LOCK FLUSH 100 UNIT/ML IV SOLN
500.0000 [IU] | Freq: Once | INTRAVENOUS | Status: AC
  Administered 2024-07-05: 500 [IU] via INTRAVENOUS
  Filled 2024-07-05: qty 5

## 2024-07-05 NOTE — Plan of Care (Signed)
   Problem: Clinical Measurements: Goal: Ability to maintain clinical measurements within normal limits will improve Outcome: Progressing   Problem: Activity: Goal: Risk for activity intolerance will decrease Outcome: Progressing
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
## 2024-07-05 NOTE — Discharge Summary (Signed)
 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 [facility] Course: Kristin Carson was admitted to the hospital with the working diagnosis of chest pain    53 Y female with history of metastatic cervical cancer to liver, peritoneum, lungs status post recent endobronchial washing pathology consistent with metastasis, HTN, smoking, and peripheral neuropathy, who presented with chest pain. Reports pain has been increasingly getting worse over the past 3 days prior to admission, considerable dyspnea with occasional cough.  Pain radiates to the left side and back. EMS was called and she was transported to the ED.  On her initial physical examination blood pressure 122/98, HR 116, RR 17 and 02 saturation 93% Lungs with no wheezing or rhonchi, heart with S1 and S2 present and regular, abdomen with no distention, soft and non tender, no lower extremity edema.   CTA chest: Negative for PE, progression of pulmonary metastases, multiple pleural based nodules, loculated left pleural effusion, bilateral liver metastases, pulmonary hypertension.  Emphysema.  CAD.   IR consulted, small volume loculated pleural effusion on the left. Not able to access through thoracentesis.  Consulted  palliative care.  10/26 acute disorientation, code stroke called, negative CT head. Recovered well. Stroke ruled out.    Acute hypoxic respiratory failure -left pleural effusion -Due to metastatic cancer, left pleural effusion -According to the nursing staff before she left she was satting 91% on room air  Patient respiratory status improved postthoracentesis which was completed on 07/04/2024    -Was empirically on IV antibiotics -CT scan of the chest was reviewed in detail progressive large left pleural effusion, metastatic cancer and changes are noted -Pulmonary critical care was consulted, following the patient closely -[place] the care, patient is not a good candidate for intubation, or further intervention as her lung condition is not reversible   -Discussed with patient and her daughter at bedside - Recommended to complete thoracentesis - before leaving AMA   ABG-pH 7.29, pCO2 57, pO2 76 - Starting IV antibiotics, IV steroids, Nebs - Patient has changed her mind now full code aware that if she gets worse will be intubated   * Cervical cancer (HCC) -advanced with metastases No significant changes, plan of care remain the same -she is to follow-up with Dr. Davonna   -S/p treatment chemotherapy 3 cycles, -S/p evaluation by pulmonology, bronchoscopy bronchial wash -lesion, cytology consistent with metastatic cancer. - Metastatic disease to liver, peritoneal and and lungs. - Images demonstrating further progression of pulmonary metastasis with multiple new nodules in her pleural space and lung with pleural effusion and pulmonary hypertension.   - Appreciate assistance and recommendation by palliative care... Patient and family requesting full scope of care   - Patient had discussion with oncologist Dr. Davonna -continue discussion, patient/husband have poor understanding of progression of  her disease -still requesting treatment options   Patient is not mentally ready for  hospice-continue discussion with palliative care team, oncology Continue symptomatic management -therapy, as needed analgesics   Acute toxic metabolic encephalopathy,  - Mentation improved but became agitated -inappropriate and aggressive with nursing staff    worsening lung function, hypoxia, also medication induced Xanax, Atarax  - Discussed with patient and her daughter at bedside--due to her stress anxiety from metastatic cancer Medications were supposed to help but does make her confused. - Discontinue Atarax    Continue pain control with oxycodone  IR and acetaminophen .    - Hold on IV hydromorphone , long acting oxycodone  and pregabalin  to avoid encephalopathy.  - Overall with very poor prognosis.  - Acute stroke has been ruled out.   Essential hypertension hypotensive-soft discontinue BP meds     AKI (acute kidney injury) -Resolved   Hyponatremia, hyperkalemia.... Resolved   GERD (gastroesophageal reflux disease) -Continue pantoprazole .    Iron deficiency anemia due to chronic blood loss -Hemoglobin down to 6.9; Status post 1 unit PRBCs transfusion  - Patient received IV iron on 06/26/2024, initiating oral iron supplements - No overt bleeding appreciated - Will monitor    Hypoalbuminemia due to protein-calorie malnutrition -Likely moderate protein calorie malnutrition, consult dietary for supplementation    Goals of care, counseling/discussion -Goals of care discussion and advance care planning ongoing -Extensive discussion with RN, hospitalist, oncologist, chaplain, palliative care team Continue have poor grasp - not amendable to palliative care or hospice - Overall with very poor prognosis   Tobacco use -Continue nicotine  patch.    Disposition-pending discharge to SNF likely Monday     Patient is attempting to leave AGAINST MEDICAL ADVICE - Recommended patient to stay for at least thoracentesis so she can breathe better    Disposition: Patient left the  hospital AGAINST MEDICAL ADVICE Diet recommendation:  Regular diet DISCHARGE MEDICATION:    Discharge Exam: Filed Weights   07/03/24 0700 07/03/24 0825 07/05/24 0430  Weight: 79.7 kg 77.5 kg 78.3 kg    Condition at discharge: poor  The results of significant diagnostics from this hospitalization (including imaging, microbiology, ancillary and laboratory) are listed below for reference.   Imaging Studies: US  THORACENTESIS ASP PLEURAL SPACE W/IMG GUIDE Result Date: 07/04/2024 INDICATION: Patient history metastatic lung cancer, new left loculated pleural effusion. Request for diagnostic and therapeutic left thoracentesis. EXAM: ULTRASOUND GUIDED LEFT THORACENTESIS MEDICATIONS: 5 mL 1% lidocaine  COMPLICATIONS: None immediate. PROCEDURE: An ultrasound guided thoracentesis was thoroughly discussed with the patient and her family members. All questions answered. The benefits, risks, alternatives and complications were also discussed. The patient understands and wishes to proceed with the procedure. Written consent was obtained. Ultrasound was performed to localize and mark an adequate pocket of fluid in the left chest. The area was then prepped and draped in the normal sterile fashion. 1% Lidocaine  was used for local anesthesia. Under ultrasound guidance a 6 Fr Safe-T-Centesis catheter was introduced. Thoracentesis was performed. The catheter was removed and a dressing applied. FINDINGS: A total of approximately 350 mL of turbid tan fluid fluid was removed - no further fluid was able to be removed due to loculated nature of effusion. Samples were sent to the laboratory as requested by the clinical team. IMPRESSION: Successful ultrasound guided left thoracentesis yielding 350 mL of loculated pleural fluid. Performed by Clotilda Hesselbach, PA-C Electronically Signed   By: Wilkie Lent M.D.   On: 07/04/2024 15:00   DG Chest Port 1 View Result Date: 07/04/2024 CLINICAL DATA:  Thoracentesis. EXAM:  PORTABLE CHEST 1 VIEW COMPARISON:  07/03/2024 and CT chest 07/03/2024. FINDINGS: Trachea is midline. Heart size is grossly stable. Right IJ power port tip is in the SVC. Moderate to large left pleural effusion, decreased in size. No pneumothorax. Left upper and left lower lobe collapse/consolidation. Additional nodular consolidation along the minor fissure. Small right pleural effusion. IMPRESSION: 1. No pneumothorax status post left thoracentesis. Moderate to large residual left pleural effusion. 2. Left upper and left lower lobe consolidation may represent a combination of metastatic disease and pneumonia. 3. Right lung metastases better evaluated on CT chest 07/03/2024. Electronically Signed   By: Newell Eke M.D.   On: 07/04/2024 14:14   CT CHEST WO CONTRAST Result Date: 07/04/2024 IMPRESSION: 1. Increasingly large loculated left pleural effusion now depressing the left hemidiaphragm and displacing the mediastinum to the right , with associated atelectasis/consolidation, obscuring previously noted left pleural-based metastases. 2. Progressive pleural-based metastases in the right middle lobe, largest now 4.4 x 2.4 cm, 2.9 x 1.7 cm, with additional slightly interval enlarged right middle lobe pleural metastases, and new small right pleural effusion, consistent with worsening intrathoracic metastatic disease. 3. Worsening multifocal airspace disease with increasing consolidation in the left upper lobe, complete compressive collapse/consolidation in the lingula and left lower lobe, increased posterior consolidation and scattered coarsely nodular infiltrates in the right upper lobe, and further increased posterior consolidation in the right middle lobe. 4. Mild interval enlargement of mediastinal and hilar lymph nodes, including a right paratracheal node now 1.0 cm and left prevascular and left hilar nodes up to 1.2 cm, with study limitations without contrast. 5. Multifocal liver metastases involving all  segments except segment 1, without appreciable change in the largest lesions. 6. Centrilobular emphysema. 7. Aortic and coronary artery atherosclerosis chronically enlarged pulmonary trunk. 8. The Mosaic reporting platform only generated an impression for this report, but the above are the most significant findings. Electronically signed by: Francis Quam MD 07/04/2024 05:46 AM EST RP Workstation: [id number]   DG CHEST PORT 1 VIEW Result Date: 07/03/2024 CLINICAL DATA:  Shortness of breath EXAM: PORTABLE CHEST 1 VIEW COMPARISON:  Chest radiograph dated 07/01/2024 FINDINGS: Lines/tubes: Right chest wall port tip projects over the superior cavoatrial junction. Lungs: Increased near-complete opacification of the left lung. Similar hazy/confluent right lower lung opacities. Pleura: Increased large left pleural effusion. Trace blunting of the right costophrenic angle. No pneumothorax. Heart/mediastinum: Heart borders are obscured. Bones: No acute osseous abnormality. IMPRESSION: 1. Increased large left pleural effusion with near-complete opacification of the left lung. 2. Similar hazy/confluent right lower lung opacities, corresponding to known metastases. Electronically Signed   By: Limin  Xu M.D.   On: 07/03/2024 13:28   DG CHEST PORT 1 VIEW Result Date: 07/01/2024 EXAM: 1 VIEW(S) XRAY OF THE CHEST 07/01/2024 04:21:00 AM COMPARISON: 06/24/2024 CLINICAL HISTORY: FINDINGS: LINES, TUBES AND DEVICES: Right chest Port-A-Cath in place with tip at superior cavoatrial junction, unchanged. LUNGS AND PLEURA: Increased left pleural effusion now large. Associated left basilar collapse. Right lower lung zone peripheral mass again noted. No pneumothorax. HEART AND MEDIASTINUM: No acute abnormality of the cardiac and mediastinal silhouettes. Stable cardiomegaly BONES AND SOFT TISSUES: No acute osseous abnormality. IMPRESSION: 1. Increased left pleural effusion, now large, with associated left basilar collapse. 2. Right lower  lung zone peripheral mass again noted. Electronically signed by: Dorethia Molt MD 07/01/2024 04:50 AM EDT RP Workstation: [id number]   CT HEAD CODE STROKE WO CONTRAST Addendum Date: 06/26/2024 ADDENDUM: #1 was discussed by telephone with Dr.  Arrian at 10:03 hours on 06/26/2024. ---------------------------------------------------- Electronically signed by: Helayne Hurst MD 06/26/2024 10:09 AM EDT RP Workstation: [id number]   Result Date: 06/26/2024  EXAM: CT HEAD WITHOUT 06/26/2024 09:30:00 AM TECHNIQUE: CT of the head was performed without the administration of intravenous contrast. Automated exposure control, iterative reconstruction, and/or weight based adjustment of the mA/kV was utilized to reduce the radiation dose to as low as reasonably achievable. COMPARISON: Brain MRI 01/06/2024. Head CT 02/06/2024. CLINICAL HISTORY: 53 year old female with acute neuro deficit, stroke suspected, and altered mental status. History of metastatic cervical cancer. FINDINGS: BRAIN AND VENTRICLES: No acute intracranial hemorrhage. No mass effect or midline shift. No extra-axial fluid collection. No evidence of acute infarct. No hydrocephalus. Brain volume remains normal. No suspicious intracranial vascular hyperdensity. ORBITS: No acute abnormality. No gaze deviation. SINUSES AND MASTOIDS: No significant change in abnormal left middle ear or mastoid opacification and non-contrast CT appearance since 02/06/2024. Other paranasal sinuses, right middle ear and mastoid remain well aerated. SOFT TISSUES AND SKULL: No acute skull fracture. No acute soft tissue abnormality. Left lower lung opacification visible on the scout view. [place] stroke program early CT score (aspects) ----- Ganglionic (caudate, ic, lentiform nucleus, insula, M1-m3): 7 Supraganglionic (m4-m6): 3 Total: 10 IMPRESSION: 1. Normal for age non-contrast CT appearance of the brain. ASPECTS 10. 2. Left mastoid and middle ear opacification, unchanged from 02/06/2024.  3. Left lower lung opacification on scout view. Electronically signed by: Helayne Hurst MD 06/26/2024 09:38 AM EDT RP Workstation: [id number]   US  CHEST (PLEURAL EFFUSION) Result Date: 06/24/2024 CLINICAL DATA:  Patient with history of cervical cancer with metastases to the peritoneum and lungs, recent endobronchial washing pathology consistent with metastatic disease. [facility] currently admitted with dyspnea and CTA shows new loculated left pleural effusion. Request for possible left thoracentesis EXAM: BILATERAL CHEST ULTRASOUND COMPARISON:  CTA chest 06/24/2024, chest x-ray 1 view 06/24/2024 FINDINGS: Limited ultrasound of the left chest shows a small volume loculated pleural effusion, potential procedure window in the inferior portion is obscured by lung flap. The most superior portion of the pleural effusion is slightly larger however in accessible due to the scapula. Limited ultrasound of the right chest shows no pleural fluid. IMPRESSION: Limited ultrasound of the left chest shows a small volume loculated pleural effusion which is not accessible for safe thoracentesis. No procedure performed. Electronically Signed   By: CHRISTELLA.  Shick M.D.   On: 06/24/2024 09:23   CT Angio Chest PE W/Cm &/Or Wo Cm Result Date: 06/24/2024 EXAM: CTA of the Chest with contrast for PE 06/24/2024 05:07:25 AM TECHNIQUE: CTA of the chest was performed after the administration of intravenous contrast. Multiplanar reformatted images are provided for review. MIP images are provided for review. Automated exposure control, iterative reconstruction, and/or weight based adjustment of the mA/kV was utilized to reduce the radiation dose to as low as reasonably achievable. COMPARISON: 06/06/2024 CLINICAL HISTORY: Pulmonary embolism (PE) suspected, high prob; CP, SOB, metastatic cervical cancer. [facility] from home; Weak 3 days + SOB; Nausea + CP tonight; Stage 4 lung cancer FINDINGS: PULMONARY ARTERIES: Pulmonary arteries are adequately opacified  for evaluation. No pulmonary embolism. The main pulmonary artery measures 3.4 cm. MEDIASTINUM: The heart is at the upper limits of normal in size. Coronary artery calcifications are present. Trace pericardial effusion. Prominent mediastinum. There is no acute abnormality of the thoracic aorta. LYMPH NODES: A left hilar lymph node is identified. No mediastinal or axillary lymphadenopathy. LUNGS AND PLEURA: Interval progression since the previous exam. There has  been interval development of a loculated left pleural effusion overlying the left lung with components extending over the left apex. Signs of pulmonary metastases with multiple pleural-based nodules and masses in both lungs. Index lesion within the lingula is again identified measuring 4.1 x 3.1 cm (image 68/8), previously 4.1 x 3.0 cm. Index lesion within the lateral right middle lobe measures 3.2 x 1.7 cm (image 67/14), previously 2.7 x 1.7 cm. Lesion within the superior segment of the left lower lobe measures 2.8 x 2.4 cm (image 37/14), previously 2.3 x 2.5 cm. Mild emphysema with diffuse bronchial wall thickening. UPPER ABDOMEN: Bilateral liver metastases are again noted. Dominant lesion within the posteromedial right hepatic lobe measures 7.7 x 5.8 cm (image 109/8), previously 7.2 x 4.9 cm. Lesion within the medial segment of the left lobe of liver measures 6.7 x 6.4 cm (image 110/8), previously 5.8 x 5.5 cm. SOFT TISSUES AND BONES: No acute bone or soft tissue abnormality. IMPRESSION: 1. No evidence of pulmonary embolism. 2. Interval progression of pulmonary metastases with multiple pleural-based nodules and masses in both lungs. 3. Interval development of a loculated left pleural effusion. Concerning for new malignant pleural effusion. 4. Bilateral liver metastases with interval progression. 5. Increased caliber of the main pulmonary artery suggestive of pulmonary artery hypertension. 6. Emphysema and coronary artery calcifications. Electronically  signed by: Waddell Calk MD 06/24/2024 06:30 AM EDT RP Workstation: [id number]   DG Chest Port 1 View Result Date: 06/24/2024 CLINICAL DATA:  Shortness of breath, chest pain EXAM: PORTABLE CHEST 1 VIEW COMPARISON:  06/07/2024 FINDINGS: Cardiac shadow is stable. Right chest wall port is again seen. Soft tissue density along the right lateral chest wall is noted consistent with the known history of metastatic disease better visualized on the current exam. The superior segment left lower lobe mass and lateral left basilar mass are noted as well. No new focal abnormality is noted. IMPRESSION: Multiple pulmonary metastatic lesions.  No acute abnormality noted. Electronically Signed   By: Oneil Devonshire M.D.   On: 06/24/2024 04:01   CT SUPER D CHEST WO CONTRAST Result Date: 06/09/2024 CLINICAL DATA:  Lung mass metastatic ovarian cancer * Tracking Code: BO * EXAM: CT CHEST WITHOUT CONTRAST TECHNIQUE: Multidetector CT imaging of the chest was performed using thin slice collimation for electromagnetic bronchoscopy planning purposes, without intravenous contrast. RADIATION DOSE REDUCTION: This exam was performed according to the departmental dose-optimization program which includes automated exposure control, adjustment of the mA and/or kV according to patient size and/or use of iterative reconstruction technique. COMPARISON:  04/25/2024 FINDINGS: Cardiovascular: Right chest port catheter. Normal heart size. Scattered left and right coronary artery calcifications. No pericardial effusion. Mediastinum/Nodes: No enlarged mediastinal, hilar, or axillary lymph nodes. Thyroid  gland, trachea, and esophagus demonstrate no significant findings. Lungs/Pleura: Mild emphysema. Diffuse bilateral bronchial wall thickening. Slight interval enlargement of multiple bilateral pulmonary masses and nodules, index lesion in the superior segment left lower lobe measuring 2.8 x 2.2 cm, previously 2.2 x 1.9 cm (series 4, image 50), index  lesion in the lingula measuring 4.1 x 3.0 cm, previously 3.3 x 2.3 cm (series 4, image 73). At least 1 tiny new nodule of the posterior right upper lobe measuring 0.4 cm (series 4, image 43). No pleural effusion or pneumothorax. Upper Abdomen: No acute abnormality. Multiple bulky hypodense hepatic metastases enlarged compared to prior examination, index lesion in the posterior liver measuring 6.9 x 5.8 cm, previously 4.8 x 4.1 cm (series 2, image 119). Musculoskeletal: No chest wall abnormality. No  acute osseous findings. IMPRESSION: 1. Multiple new and enlarged pulmonary metastases. 2. Multiple multiple enlarged hepatic metastases. 3. Emphysema and diffuse bilateral bronchial wall thickening. 4. Coronary artery disease. Emphysema (ICD10-J43.9). Electronically Signed   By: Marolyn JONETTA Jaksch M.D.   On: 06/09/2024 13:00   DG Chest Port 1 View Result Date: 06/07/2024 CLINICAL DATA:  Status post bronchoscopy EXAM: PORTABLE CHEST 1 VIEW COMPARISON:  June 06, 2024 chest CT and prior studies FINDINGS: Right chest wall port with interval twisting of the catheter tubing at the right neck and catheter tip projecting over the mid SVC. Hypoinflation. The known peripheral pulmonary masses better seen on recent chest CT. No definite pleural effusions or pneumothorax. Unchanged cardiomediastinal silhouette. No acute osseous findings. IMPRESSION: Known pulmonary masses, better seen on prior CT. No definite pneumothorax. Interval twisting of right chest wall port catheter tubing with tip projecting over the mid SVC. Electronically Signed   By: Michaeline Blanch M.D.   On: 06/07/2024 10:30   DG C-Arm 1-60 Min-No Report Result Date: 06/07/2024 Fluoroscopy was utilized by the requesting physician.  No radiographic interpretation.    Microbiology: Results for orders placed or performed during the hospital encounter of 06/24/24  MRSA Next Gen by PCR, Nasal     Status: None   Collection Time: 07/03/24  7:59 AM   Specimen: Nasal  Mucosa; Nasal Swab  Result Value Ref Range Status   MRSA by PCR Next Gen NOT DETECTED NOT DETECTED Final    Comment: (NOTE) The GeneXpert MRSA Assay (FDA approved for NASAL specimens only), is one component of a comprehensive MRSA colonization surveillance program. It is not intended to diagnose MRSA infection nor to guide or monitor treatment for MRSA infections. Test performance is not FDA approved in patients less than 45 years old. Performed at [facility], [address], [place], [place] [address]   Culture, blood (Routine X 2) w Reflex to ID Panel     Status: None (Preliminary result)   Collection Time: 07/03/24  8:27 AM   Specimen: BLOOD LEFT ARM  Result Value Ref Range Status   Specimen Description BLOOD LEFT ARM AEROBIC BOTTLE ONLY  Final   Special Requests Blood Culture adequate volume  Final   Culture   Final    NO GROWTH 2 DAYS Performed at [facility], [address], [place], [place] [address]    Report Status PENDING  Incomplete  Culture, blood (Routine X 2) w Reflex to ID Panel     Status: Abnormal (Preliminary result)   Collection Time: 07/03/24  8:27 AM   Specimen: BLOOD RIGHT ARM  Result Value Ref Range Status   Specimen Description   Final    BLOOD RIGHT ARM BOTTLES DRAWN AEROBIC AND ANAEROBIC Performed at [facility], [address] [place], [place] [address]    Special Requests   Final    Blood Culture adequate volume Performed at [facility], [address] [place], [place] [address]    Culture  Setup Time   Final    GRAM POSITIVE COCCI IN BOTH AEROBIC AND ANAEROBIC BOTTLES CRITICAL RESULT CALLED TO, READ BACK BY AND VERIFIED WITH: ESTOCE,R ON 07/04/24 AT 0140 BY PURDIE,J CRITICAL RESULT CALLED TO, READ BACK BY AND VERIFIED WITH: PHARMD S.HURTH AT 0748 ON 07/04/2024 BY T.SAAD.    Culture (A)  Final    STREPTOCOCCUS PNEUMONIAE STAPHYLOCOCCUS EPIDERMIDIS SUSCEPTIBILITIES TO FOLLOW THE SIGNIFICANCE OF ISOLATING THIS ORGANISM FROM A SINGLE SET OF  BLOOD CULTURES WHEN MULTIPLE SETS ARE DRAWN IS UNCERTAIN.  PLEASE NOTIFY THE MICROBIOLOGY DEPARTMENT WITHIN ONE WEEK IF SPECIATION AND SENSITIVITIES ARE REQUIRED. Performed at [facility], [address] [place], [place] [address]    Report Status PENDING  Incomplete  Blood Culture ID Panel (Reflexed)     Status: Abnormal   Collection Time: 07/03/24  8:27 AM  Result Value Ref Range Status   Enterococcus faecalis NOT DETECTED NOT DETECTED Final   Enterococcus Faecium NOT DETECTED NOT DETECTED Final   Listeria monocytogenes NOT DETECTED NOT DETECTED Final   Staphylococcus species NOT DETECTED NOT DETECTED Final   Staphylococcus aureus (BCID) NOT DETECTED NOT DETECTED Final   Staphylococcus epidermidis NOT DETECTED NOT DETECTED Final   Staphylococcus lugdunensis NOT DETECTED NOT DETECTED Final   Streptococcus species DETECTED (A) NOT DETECTED Final    Comment: CRITICAL RESULT CALLED TO, READ BACK BY AND VERIFIED WITH: PHARMD S.HURTH AT 9251 ON 07/04/2024 BY T.SAAD.    Streptococcus agalactiae NOT DETECTED NOT DETECTED Final   Streptococcus pneumoniae DETECTED (A) NOT DETECTED Final    Comment: CRITICAL RESULT CALLED TO, READ BACK BY AND VERIFIED WITH: PHARMD S.HURTH AT 9251 ON 07/04/2024 BY T.SAAD.    Streptococcus pyogenes NOT DETECTED NOT DETECTED Final   A.calcoaceticus-baumannii NOT DETECTED NOT DETECTED Final   Bacteroides fragilis NOT DETECTED NOT DETECTED Final   Enterobacterales NOT DETECTED NOT DETECTED Final   Enterobacter cloacae complex NOT DETECTED NOT DETECTED Final   Escherichia coli NOT DETECTED NOT DETECTED Final   Klebsiella aerogenes NOT DETECTED NOT DETECTED Final   Klebsiella oxytoca NOT DETECTED NOT DETECTED Final   Klebsiella pneumoniae NOT DETECTED NOT DETECTED Final   Proteus species NOT DETECTED NOT DETECTED Final   Salmonella species NOT DETECTED NOT DETECTED Final   Serratia marcescens NOT DETECTED NOT DETECTED Final   Haemophilus influenzae NOT  DETECTED NOT DETECTED Final   Neisseria meningitidis NOT DETECTED NOT DETECTED Final   Pseudomonas aeruginosa NOT DETECTED NOT DETECTED Final   Stenotrophomonas maltophilia NOT DETECTED NOT DETECTED Final   Candida albicans NOT DETECTED NOT DETECTED Final   Candida auris NOT DETECTED NOT DETECTED Final   Candida glabrata NOT DETECTED NOT DETECTED Final   Candida krusei NOT DETECTED NOT DETECTED Final   Candida parapsilosis NOT DETECTED NOT DETECTED Final   Candida tropicalis NOT DETECTED NOT DETECTED Final   Cryptococcus neoformans/gattii NOT DETECTED NOT DETECTED Final    Comment: Performed at [facility], [address], [place], [place] [address]    Labs: CBC: Recent Labs  Lab 07/01/24 0911 07/02/24 0401 07/03/24 0441 07/04/24 0411 07/05/24 0440  WBC 17.4* 14.2* 15.8* 11.2* 10.7*  NEUTROABS 12.2* 8.8* 11.0* 9.2* 8.7*  HGB 8.3* 8.5* 8.5* 8.8* 8.0*  HCT 26.2* 26.1* 26.6* 27.3* 24.6*  MCV 86.2 84.2 85.8 84.8 82.6  PLT 520* 530* 555* 544* 522*   Basic Metabolic Panel: Recent Labs  Lab 06/30/24 2125 07/03/24 0827 07/04/24 0411 07/05/24 0440  NA  --  133* 133* 138  K  --  4.8 3.9 4.4  CL  --  95* 94* 97*  CO2  --  26 31 34*  GLUCOSE  --  99 165* 114*  BUN  --  16 18 21*  CREATININE  --  0.81 1.04* 1.09*  CALCIUM   --  8.8* 8.7* 8.7*  MG 2.0  --   --   --    Liver Function Tests: Recent Labs  Lab 07/03/24 0827 07/04/24 0411 07/05/24 0440  AST 44* 39 36  ALT <5 7  7  ALKPHOS 287* 256* 232*  BILITOT 0.7 0.6 0.5  PROT 7.1 7.0 6.5  ALBUMIN 2.9* 2.9* 2.6*   CBG: Recent Labs  Lab 07/01/24 1304 07/02/24 0727 07/03/24 0717 07/03/24 1618 07/04/24 0739  GLUCAP 83 102* 98 117* 156*     Signed: Adriana DELENA Grams, MD [facility] 07/05/2024

## 2024-07-05 NOTE — Progress Notes (Addendum)
 Patient pulled off port dressing this morning and threw breakfast tray across the room. When attempting to provide the patient water, patient threw water. Patient agreed to take a Xanax but refused other medication. Patient has remained agitated with staff and is continuously yelling in the room. Patient has also been removing EKG leads.   Patient alert and oriented x4. Educated by multiple different staff members on risks of leaving. Patient stated leave me the fuck alone. She threatened to punch staff and continued to throw objects in the room. Patient stated give me my ink pen so I can sign the form. Patient signed AMA form. MD aware.   Patient port access removed and site intact; hep locked. Patient is satisfied that all belongings have been returned. Patient is aware that sister is on the way to pick her up

## 2024-07-05 NOTE — Progress Notes (Signed)
 Pt's niece can be called if she patient needs someone to calm her down. Her name is Geraldene [phone]. B.Lekesha Claw RN

## 2024-07-06 ENCOUNTER — Encounter: Payer: Self-pay | Admitting: Oncology

## 2024-07-06 DIAGNOSIS — J449 Chronic obstructive pulmonary disease, unspecified: Secondary | ICD-10-CM | POA: Diagnosis not present

## 2024-07-06 LAB — FUNGUS CULTURE RESULT

## 2024-07-06 LAB — FUNGAL ORGANISM REFLEX

## 2024-07-06 LAB — CULTURE, BLOOD (ROUTINE X 2): Special Requests: ADEQUATE

## 2024-07-06 LAB — FUNGUS CULTURE WITH STAIN

## 2024-07-06 NOTE — Progress Notes (Signed)
 [facility] CSW Progress Note   Interventions: CSW received a call from pt's husband, Kristin Carson, who informed CSW pt left the hospital AMA and is at home alone.  Per Kristin pt will not let him into the home and becomes very agitated when he tries.  Kristin reports he left the home and has checked the cameras and witnessed pt fall in the yard.  It appeared a neighbor came out of their house and assisted pt back into the home.  Kristin Carson requesting assistance getting possibly hospice care to the home.  CSW contacted the paramedicine team from [facility] who agreed to go to the home to speak with pt.  Upon their arrival they contacted CSW by phone, putting phone on speaker to allow all of us  to speak with pt.  Pt reportedly does not have a cell phone as the service was shut off.  Pt was receptive to having a referral sent to hospice as long as she is able to remain in her own home.  Pt gave CSW permission to contact her sister to facilitate scheduling a time for hospice to come to the home for their assessment.  CSW contacted pt's sister Kristin Carson) who verbalized agreement w/ being the main contact and will meet hospice at pt's home 11/5.  Pt reports her water has been cut off due to non-payment.  CSW contacted the patient financial resource specialist and w/ permission from Kristin Carson the bill has been paid and the water service has been restored.  CSW to remain available as appropriate.       Follow Up Plan:  Patient will contact CSW with any support or resource needs    Kristin JONELLE Manna, LCSW Clinical Social Worker [facility]    Patient is participating in a Managed Medicaid Plan:  Yes

## 2024-07-07 DIAGNOSIS — J449 Chronic obstructive pulmonary disease, unspecified: Secondary | ICD-10-CM | POA: Diagnosis not present

## 2024-07-08 DIAGNOSIS — J449 Chronic obstructive pulmonary disease, unspecified: Secondary | ICD-10-CM | POA: Diagnosis not present

## 2024-07-08 LAB — CULTURE, BLOOD (ROUTINE X 2)
Culture: NO GROWTH
Special Requests: ADEQUATE

## 2024-07-09 DIAGNOSIS — J449 Chronic obstructive pulmonary disease, unspecified: Secondary | ICD-10-CM | POA: Diagnosis not present

## 2024-07-11 ENCOUNTER — Inpatient Hospital Stay

## 2024-07-11 ENCOUNTER — Inpatient Hospital Stay: Admitting: Oncology

## 2024-07-11 DIAGNOSIS — J449 Chronic obstructive pulmonary disease, unspecified: Secondary | ICD-10-CM | POA: Diagnosis not present

## 2024-07-13 ENCOUNTER — Encounter (HOSPITAL_COMMUNITY): Payer: Self-pay

## 2024-07-13 ENCOUNTER — Inpatient Hospital Stay (HOSPITAL_COMMUNITY)
Admission: EM | Admit: 2024-07-13 | Discharge: 2024-08-01 | DRG: 871 | Disposition: E | Attending: Pulmonary Disease | Admitting: Pulmonary Disease

## 2024-07-13 ENCOUNTER — Other Ambulatory Visit: Payer: Self-pay

## 2024-07-13 ENCOUNTER — Emergency Department (HOSPITAL_COMMUNITY)

## 2024-07-13 DIAGNOSIS — R6510 Systemic inflammatory response syndrome (SIRS) of non-infectious origin without acute organ dysfunction: Secondary | ICD-10-CM | POA: Insufficient documentation

## 2024-07-13 DIAGNOSIS — Z8541 Personal history of malignant neoplasm of cervix uteri: Secondary | ICD-10-CM

## 2024-07-13 DIAGNOSIS — A419 Sepsis, unspecified organism: Secondary | ICD-10-CM | POA: Diagnosis not present

## 2024-07-13 DIAGNOSIS — J9601 Acute respiratory failure with hypoxia: Secondary | ICD-10-CM | POA: Diagnosis not present

## 2024-07-13 DIAGNOSIS — D696 Thrombocytopenia, unspecified: Secondary | ICD-10-CM | POA: Diagnosis present

## 2024-07-13 DIAGNOSIS — Z8249 Family history of ischemic heart disease and other diseases of the circulatory system: Secondary | ICD-10-CM

## 2024-07-13 DIAGNOSIS — J90 Pleural effusion, not elsewhere classified: Secondary | ICD-10-CM | POA: Diagnosis not present

## 2024-07-13 DIAGNOSIS — J154 Pneumonia due to other streptococci: Secondary | ICD-10-CM | POA: Diagnosis present

## 2024-07-13 DIAGNOSIS — R1900 Intra-abdominal and pelvic swelling, mass and lump, unspecified site: Secondary | ICD-10-CM | POA: Diagnosis not present

## 2024-07-13 DIAGNOSIS — I10 Essential (primary) hypertension: Secondary | ICD-10-CM | POA: Diagnosis present

## 2024-07-13 DIAGNOSIS — C799 Secondary malignant neoplasm of unspecified site: Secondary | ICD-10-CM | POA: Diagnosis not present

## 2024-07-13 DIAGNOSIS — R112 Nausea with vomiting, unspecified: Secondary | ICD-10-CM | POA: Diagnosis present

## 2024-07-13 DIAGNOSIS — D631 Anemia in chronic kidney disease: Secondary | ICD-10-CM | POA: Diagnosis present

## 2024-07-13 DIAGNOSIS — C7801 Secondary malignant neoplasm of right lung: Secondary | ICD-10-CM | POA: Diagnosis not present

## 2024-07-13 DIAGNOSIS — J9811 Atelectasis: Secondary | ICD-10-CM | POA: Diagnosis not present

## 2024-07-13 DIAGNOSIS — Z95828 Presence of other vascular implants and grafts: Secondary | ICD-10-CM

## 2024-07-13 DIAGNOSIS — J440 Chronic obstructive pulmonary disease with acute lower respiratory infection: Secondary | ICD-10-CM | POA: Diagnosis present

## 2024-07-13 DIAGNOSIS — E871 Hypo-osmolality and hyponatremia: Secondary | ICD-10-CM | POA: Diagnosis present

## 2024-07-13 DIAGNOSIS — I959 Hypotension, unspecified: Secondary | ICD-10-CM | POA: Diagnosis present

## 2024-07-13 DIAGNOSIS — Z743 Need for continuous supervision: Secondary | ICD-10-CM | POA: Diagnosis not present

## 2024-07-13 DIAGNOSIS — R180 Malignant ascites: Secondary | ICD-10-CM | POA: Diagnosis present

## 2024-07-13 DIAGNOSIS — R930 Abnormal findings on diagnostic imaging of skull and head, not elsewhere classified: Secondary | ICD-10-CM | POA: Diagnosis not present

## 2024-07-13 DIAGNOSIS — E875 Hyperkalemia: Secondary | ICD-10-CM | POA: Diagnosis present

## 2024-07-13 DIAGNOSIS — R7989 Other specified abnormal findings of blood chemistry: Secondary | ICD-10-CM | POA: Diagnosis not present

## 2024-07-13 DIAGNOSIS — E872 Acidosis, unspecified: Secondary | ICD-10-CM | POA: Diagnosis not present

## 2024-07-13 DIAGNOSIS — Z886 Allergy status to analgesic agent status: Secondary | ICD-10-CM

## 2024-07-13 DIAGNOSIS — A403 Sepsis due to Streptococcus pneumoniae: Secondary | ICD-10-CM | POA: Diagnosis present

## 2024-07-13 DIAGNOSIS — T68XXXA Hypothermia, initial encounter: Secondary | ICD-10-CM | POA: Diagnosis not present

## 2024-07-13 DIAGNOSIS — Z515 Encounter for palliative care: Secondary | ICD-10-CM

## 2024-07-13 DIAGNOSIS — E860 Dehydration: Secondary | ICD-10-CM | POA: Diagnosis present

## 2024-07-13 DIAGNOSIS — R680 Hypothermia, not associated with low environmental temperature: Secondary | ICD-10-CM | POA: Diagnosis present

## 2024-07-13 DIAGNOSIS — Z66 Do not resuscitate: Secondary | ICD-10-CM | POA: Diagnosis present

## 2024-07-13 DIAGNOSIS — I5032 Chronic diastolic (congestive) heart failure: Secondary | ICD-10-CM | POA: Diagnosis present

## 2024-07-13 DIAGNOSIS — J9602 Acute respiratory failure with hypercapnia: Secondary | ICD-10-CM | POA: Diagnosis not present

## 2024-07-13 DIAGNOSIS — A4152 Sepsis due to Pseudomonas: Principal | ICD-10-CM | POA: Diagnosis present

## 2024-07-13 DIAGNOSIS — C539 Malignant neoplasm of cervix uteri, unspecified: Secondary | ICD-10-CM | POA: Diagnosis not present

## 2024-07-13 DIAGNOSIS — R6520 Severe sepsis without septic shock: Secondary | ICD-10-CM | POA: Diagnosis not present

## 2024-07-13 DIAGNOSIS — R748 Abnormal levels of other serum enzymes: Secondary | ICD-10-CM | POA: Diagnosis present

## 2024-07-13 DIAGNOSIS — F17210 Nicotine dependence, cigarettes, uncomplicated: Secondary | ICD-10-CM | POA: Diagnosis present

## 2024-07-13 DIAGNOSIS — C786 Secondary malignant neoplasm of retroperitoneum and peritoneum: Secondary | ICD-10-CM | POA: Diagnosis present

## 2024-07-13 DIAGNOSIS — R7401 Elevation of levels of liver transaminase levels: Secondary | ICD-10-CM | POA: Diagnosis present

## 2024-07-13 DIAGNOSIS — Z7951 Long term (current) use of inhaled steroids: Secondary | ICD-10-CM

## 2024-07-13 DIAGNOSIS — C7800 Secondary malignant neoplasm of unspecified lung: Secondary | ICD-10-CM | POA: Diagnosis present

## 2024-07-13 DIAGNOSIS — J432 Centrilobular emphysema: Secondary | ICD-10-CM | POA: Diagnosis not present

## 2024-07-13 DIAGNOSIS — I5030 Unspecified diastolic (congestive) heart failure: Secondary | ICD-10-CM | POA: Diagnosis not present

## 2024-07-13 DIAGNOSIS — I700 Atherosclerosis of aorta: Secondary | ICD-10-CM | POA: Diagnosis not present

## 2024-07-13 DIAGNOSIS — R6889 Other general symptoms and signs: Secondary | ICD-10-CM | POA: Diagnosis not present

## 2024-07-13 DIAGNOSIS — F32A Depression, unspecified: Secondary | ICD-10-CM | POA: Diagnosis present

## 2024-07-13 DIAGNOSIS — G9341 Metabolic encephalopathy: Secondary | ICD-10-CM | POA: Diagnosis present

## 2024-07-13 DIAGNOSIS — C787 Secondary malignant neoplasm of liver and intrahepatic bile duct: Secondary | ICD-10-CM | POA: Diagnosis present

## 2024-07-13 DIAGNOSIS — R6521 Severe sepsis with septic shock: Secondary | ICD-10-CM | POA: Diagnosis present

## 2024-07-13 DIAGNOSIS — N189 Chronic kidney disease, unspecified: Secondary | ICD-10-CM | POA: Diagnosis present

## 2024-07-13 DIAGNOSIS — J869 Pyothorax without fistula: Secondary | ICD-10-CM | POA: Diagnosis present

## 2024-07-13 DIAGNOSIS — I130 Hypertensive heart and chronic kidney disease with heart failure and stage 1 through stage 4 chronic kidney disease, or unspecified chronic kidney disease: Secondary | ICD-10-CM | POA: Diagnosis present

## 2024-07-13 DIAGNOSIS — G9340 Encephalopathy, unspecified: Secondary | ICD-10-CM | POA: Diagnosis not present

## 2024-07-13 DIAGNOSIS — E11649 Type 2 diabetes mellitus with hypoglycemia without coma: Secondary | ICD-10-CM | POA: Diagnosis not present

## 2024-07-13 DIAGNOSIS — Z885 Allergy status to narcotic agent status: Secondary | ICD-10-CM

## 2024-07-13 DIAGNOSIS — N179 Acute kidney failure, unspecified: Secondary | ICD-10-CM | POA: Diagnosis present

## 2024-07-13 DIAGNOSIS — D649 Anemia, unspecified: Secondary | ICD-10-CM | POA: Diagnosis not present

## 2024-07-13 DIAGNOSIS — E162 Hypoglycemia, unspecified: Secondary | ICD-10-CM | POA: Diagnosis not present

## 2024-07-13 DIAGNOSIS — Z419 Encounter for procedure for purposes other than remedying health state, unspecified: Secondary | ICD-10-CM | POA: Diagnosis not present

## 2024-07-13 DIAGNOSIS — C801 Malignant (primary) neoplasm, unspecified: Secondary | ICD-10-CM | POA: Diagnosis not present

## 2024-07-13 DIAGNOSIS — Z1152 Encounter for screening for COVID-19: Secondary | ICD-10-CM

## 2024-07-13 DIAGNOSIS — R0989 Other specified symptoms and signs involving the circulatory and respiratory systems: Secondary | ICD-10-CM | POA: Diagnosis not present

## 2024-07-13 DIAGNOSIS — J929 Pleural plaque without asbestos: Secondary | ICD-10-CM | POA: Diagnosis not present

## 2024-07-13 DIAGNOSIS — Z4682 Encounter for fitting and adjustment of non-vascular catheter: Secondary | ICD-10-CM | POA: Diagnosis not present

## 2024-07-13 DIAGNOSIS — J910 Malignant pleural effusion: Secondary | ICD-10-CM | POA: Diagnosis present

## 2024-07-13 DIAGNOSIS — W1830XA Fall on same level, unspecified, initial encounter: Secondary | ICD-10-CM | POA: Diagnosis present

## 2024-07-13 DIAGNOSIS — Z452 Encounter for adjustment and management of vascular access device: Secondary | ICD-10-CM | POA: Diagnosis not present

## 2024-07-13 DIAGNOSIS — Z888 Allergy status to other drugs, medicaments and biological substances status: Secondary | ICD-10-CM

## 2024-07-13 DIAGNOSIS — K5900 Constipation, unspecified: Secondary | ICD-10-CM | POA: Diagnosis present

## 2024-07-13 DIAGNOSIS — Z9109 Other allergy status, other than to drugs and biological substances: Secondary | ICD-10-CM

## 2024-07-13 DIAGNOSIS — Z79899 Other long term (current) drug therapy: Secondary | ICD-10-CM

## 2024-07-13 DIAGNOSIS — K7460 Unspecified cirrhosis of liver: Secondary | ICD-10-CM | POA: Diagnosis present

## 2024-07-13 DIAGNOSIS — R109 Unspecified abdominal pain: Secondary | ICD-10-CM | POA: Diagnosis present

## 2024-07-13 DIAGNOSIS — I499 Cardiac arrhythmia, unspecified: Secondary | ICD-10-CM | POA: Diagnosis not present

## 2024-07-13 DIAGNOSIS — R918 Other nonspecific abnormal finding of lung field: Secondary | ICD-10-CM | POA: Diagnosis not present

## 2024-07-13 DIAGNOSIS — R188 Other ascites: Secondary | ICD-10-CM | POA: Diagnosis not present

## 2024-07-13 DIAGNOSIS — Z801 Family history of malignant neoplasm of trachea, bronchus and lung: Secondary | ICD-10-CM

## 2024-07-13 DIAGNOSIS — D630 Anemia in neoplastic disease: Secondary | ICD-10-CM | POA: Diagnosis present

## 2024-07-13 DIAGNOSIS — J449 Chronic obstructive pulmonary disease, unspecified: Secondary | ICD-10-CM | POA: Diagnosis not present

## 2024-07-13 DIAGNOSIS — K219 Gastro-esophageal reflux disease without esophagitis: Secondary | ICD-10-CM | POA: Diagnosis present

## 2024-07-13 LAB — GLUCOSE, CAPILLARY
Glucose-Capillary: 28 mg/dL — CL (ref 70–99)
Glucose-Capillary: 73 mg/dL (ref 70–99)

## 2024-07-13 LAB — CBC WITH DIFFERENTIAL/PLATELET
Abs Immature Granulocytes: 2.6 K/uL — ABNORMAL HIGH (ref 0.00–0.07)
Band Neutrophils: 2 %
Basophils Absolute: 0 K/uL (ref 0.0–0.1)
Basophils Relative: 0 %
Eosinophils Absolute: 0 K/uL (ref 0.0–0.5)
Eosinophils Relative: 0 %
HCT: 28.2 % — ABNORMAL LOW (ref 36.0–46.0)
Hemoglobin: 9.6 g/dL — ABNORMAL LOW (ref 12.0–15.0)
Lymphocytes Relative: 7 %
Lymphs Abs: 1.6 K/uL (ref 0.7–4.0)
MCH: 27.7 pg (ref 26.0–34.0)
MCHC: 34 g/dL (ref 30.0–36.0)
MCV: 81.3 fL (ref 80.0–100.0)
Metamyelocytes Relative: 7 %
Monocytes Absolute: 0.7 K/uL (ref 0.1–1.0)
Monocytes Relative: 3 %
Myelocytes: 4 %
Neutro Abs: 18.6 K/uL — ABNORMAL HIGH (ref 1.7–7.7)
Neutrophils Relative %: 77 %
Platelets: 150 K/uL (ref 150–400)
RBC: 3.47 MIL/uL — ABNORMAL LOW (ref 3.87–5.11)
RDW: 21.5 % — ABNORMAL HIGH (ref 11.5–15.5)
Smear Review: NORMAL
WBC: 23.5 K/uL — ABNORMAL HIGH (ref 4.0–10.5)
nRBC: 0 % (ref 0.0–0.2)

## 2024-07-13 LAB — BASIC METABOLIC PANEL WITH GFR
Anion gap: 12 (ref 5–15)
BUN: 33 mg/dL — ABNORMAL HIGH (ref 6–20)
CO2: 27 mmol/L (ref 22–32)
Calcium: 7.8 mg/dL — ABNORMAL LOW (ref 8.9–10.3)
Chloride: 88 mmol/L — ABNORMAL LOW (ref 98–111)
Creatinine, Ser: 2.52 mg/dL — ABNORMAL HIGH (ref 0.44–1.00)
GFR, Estimated: 22 mL/min — ABNORMAL LOW (ref >60)
Glucose, Bld: 73 mg/dL (ref 70–99)
Potassium: 6.2 mmol/L — ABNORMAL HIGH (ref 3.5–5.1)
Sodium: 128 mmol/L — ABNORMAL LOW (ref 135–145)

## 2024-07-13 LAB — PROTIME-INR
INR: 1.8 — ABNORMAL HIGH (ref 0.8–1.2)
Prothrombin Time: 22.1 s — ABNORMAL HIGH (ref 11.4–15.2)

## 2024-07-13 LAB — COMPREHENSIVE METABOLIC PANEL WITH GFR
ALT: 24 U/L (ref 0–44)
AST: 183 U/L — ABNORMAL HIGH (ref 15–41)
Albumin: 2.4 g/dL — ABNORMAL LOW (ref 3.5–5.0)
Alkaline Phosphatase: 187 U/L — ABNORMAL HIGH (ref 38–126)
Anion gap: 16 — ABNORMAL HIGH (ref 5–15)
BUN: 32 mg/dL — ABNORMAL HIGH (ref 6–20)
CO2: 25 mmol/L (ref 22–32)
Calcium: 8 mg/dL — ABNORMAL LOW (ref 8.9–10.3)
Chloride: 87 mmol/L — ABNORMAL LOW (ref 98–111)
Creatinine, Ser: 2.48 mg/dL — ABNORMAL HIGH (ref 0.44–1.00)
GFR, Estimated: 23 mL/min — ABNORMAL LOW (ref >60)
Glucose, Bld: 93 mg/dL (ref 70–99)
Potassium: 6.2 mmol/L — ABNORMAL HIGH (ref 3.5–5.1)
Sodium: 127 mmol/L — ABNORMAL LOW (ref 135–145)
Total Bilirubin: 1.2 mg/dL (ref 0.0–1.2)
Total Protein: 5.9 g/dL — ABNORMAL LOW (ref 6.5–8.1)

## 2024-07-13 LAB — LACTIC ACID, PLASMA
Lactic Acid, Venous: 6.2 mmol/L (ref 0.5–1.9)
Lactic Acid, Venous: 4.4 mmol/L (ref 0.5–1.9)

## 2024-07-13 LAB — CBG MONITORING, ED: Glucose-Capillary: 159 mg/dL — ABNORMAL HIGH (ref 70–99)

## 2024-07-13 LAB — RESP PANEL BY RT-PCR (RSV, FLU A&B, COVID)  RVPGX2
Influenza A by PCR: NEGATIVE
Influenza B by PCR: NEGATIVE
Resp Syncytial Virus by PCR: NEGATIVE
SARS Coronavirus 2 by RT PCR: NEGATIVE

## 2024-07-13 LAB — MRSA NEXT GEN BY PCR, NASAL: MRSA by PCR Next Gen: NOT DETECTED

## 2024-07-13 MED ORDER — VANCOMYCIN HCL IN DEXTROSE 1-5 GM/200ML-% IV SOLN: 1000.0000 mg | Freq: Once | INTRAVENOUS | Status: DC

## 2024-07-13 MED ORDER — ONDANSETRON HCL 4 MG PO TABS: 4.0000 mg | ORAL_TABLET | Freq: Four times a day (QID) | ORAL | Status: DC | PRN

## 2024-07-13 MED ORDER — DEXTROSE-SODIUM CHLORIDE 5-0.9 % IV SOLN: INTRAVENOUS | Status: DC

## 2024-07-13 MED ORDER — DEXTROSE 50 % IV SOLN
INTRAVENOUS | Status: AC
  Administered 2024-07-13: 50 mL via INTRAVENOUS
  Filled 2024-07-13: qty 50

## 2024-07-13 MED ORDER — ALBUTEROL SULFATE (2.5 MG/3ML) 0.083% IN NEBU
10.0000 mg | INHALATION_SOLUTION | Freq: Once | RESPIRATORY_TRACT | Status: AC
  Administered 2024-07-13: 10 mg via RESPIRATORY_TRACT
  Filled 2024-07-13: qty 12

## 2024-07-13 MED ORDER — SODIUM BICARBONATE 8.4 % IV SOLN
50.0000 meq | Freq: Once | INTRAVENOUS | Status: AC
  Administered 2024-07-13: 50 meq via INTRAVENOUS
  Filled 2024-07-13: qty 50

## 2024-07-13 MED ORDER — OXYCODONE HCL 5 MG PO TABS
5.0000 mg | ORAL_TABLET | Freq: Four times a day (QID) | ORAL | Status: DC | PRN
  Administered 2024-07-14: 5 mg via ORAL
  Filled 2024-07-13: qty 1

## 2024-07-13 MED ORDER — SODIUM CHLORIDE 0.9 % IV SOLN
2.0000 g | Freq: Once | INTRAVENOUS | Status: AC
  Administered 2024-07-13: 2 g via INTRAVENOUS
  Filled 2024-07-13: qty 12.5

## 2024-07-13 MED ORDER — LACTATED RINGERS IV BOLUS (SEPSIS)
1000.0000 mL | Freq: Once | INTRAVENOUS | Status: AC
  Administered 2024-07-13: 1000 mL via INTRAVENOUS

## 2024-07-13 MED ORDER — METRONIDAZOLE 500 MG/100ML IV SOLN
500.0000 mg | Freq: Once | INTRAVENOUS | Status: AC
  Administered 2024-07-13: 500 mg via INTRAVENOUS
  Filled 2024-07-13: qty 100

## 2024-07-13 MED ORDER — ALBUTEROL SULFATE (2.5 MG/3ML) 0.083% IN NEBU
5.0000 mg | INHALATION_SOLUTION | Freq: Once | RESPIRATORY_TRACT | Status: AC
  Administered 2024-07-13: 5 mg via RESPIRATORY_TRACT
  Filled 2024-07-13: qty 6

## 2024-07-13 MED ORDER — VANCOMYCIN HCL 1750 MG/350ML IV SOLN
1750.0000 mg | Freq: Once | INTRAVENOUS | Status: AC
  Administered 2024-07-13: 1750 mg via INTRAVENOUS
  Filled 2024-07-13: qty 350

## 2024-07-13 MED ORDER — SODIUM CHLORIDE 0.9 % IV SOLN
2.0000 g | INTRAVENOUS | Status: DC
  Administered 2024-07-14: 2 g via INTRAVENOUS
  Filled 2024-07-13: qty 12.5

## 2024-07-13 MED ORDER — ONDANSETRON HCL 4 MG/2ML IJ SOLN: 4.0000 mg | Freq: Four times a day (QID) | INTRAMUSCULAR | Status: DC | PRN

## 2024-07-13 MED ORDER — HYDROCODONE-ACETAMINOPHEN 5-325 MG PO TABS
1.0000 | ORAL_TABLET | Freq: Once | ORAL | Status: AC
  Administered 2024-07-13: 1 via ORAL
  Filled 2024-07-13: qty 1

## 2024-07-13 MED ORDER — ACETAMINOPHEN 650 MG RE SUPP
650.0000 mg | Freq: Four times a day (QID) | RECTAL | Status: DC | PRN
Start: 2024-07-13 — End: 2024-07-15

## 2024-07-13 MED ORDER — IPRATROPIUM BROMIDE 0.02 % IN SOLN
0.5000 mg | Freq: Once | RESPIRATORY_TRACT | Status: AC
  Administered 2024-07-13: 0.5 mg via RESPIRATORY_TRACT
  Filled 2024-07-13: qty 2.5

## 2024-07-13 MED ORDER — CHLORHEXIDINE GLUCONATE CLOTH 2 % EX PADS
6.0000 | MEDICATED_PAD | Freq: Every day | CUTANEOUS | Status: DC
  Administered 2024-07-13: 6 via TOPICAL

## 2024-07-13 MED ORDER — POLYETHYLENE GLYCOL 3350 17 G PO PACK
17.0000 g | PACK | Freq: Two times a day (BID) | ORAL | Status: AC
  Administered 2024-07-13: 17 g via ORAL
  Filled 2024-07-13: qty 1

## 2024-07-13 MED ORDER — ACETAMINOPHEN 325 MG PO TABS: 650.0000 mg | ORAL_TABLET | Freq: Four times a day (QID) | ORAL | Status: DC | PRN

## 2024-07-13 MED ORDER — LACTATED RINGERS IV SOLN: INTRAVENOUS | Status: DC

## 2024-07-13 MED ORDER — DEXTROSE 50 % IV SOLN: 50.0000 mL | Freq: Once | INTRAVENOUS | Status: AC

## 2024-07-13 MED ORDER — DEXTROSE 50 % IV SOLN
25.0000 g | Freq: Once | INTRAVENOUS | Status: AC
  Administered 2024-07-13: 25 g via INTRAVENOUS
  Filled 2024-07-13: qty 50

## 2024-07-13 MED ORDER — METRONIDAZOLE 500 MG/100ML IV SOLN
500.0000 mg | Freq: Two times a day (BID) | INTRAVENOUS | Status: DC
  Administered 2024-07-14 – 2024-07-15 (×3): 500 mg via INTRAVENOUS
  Filled 2024-07-13 (×3): qty 100

## 2024-07-13 MED ORDER — CALCIUM GLUCONATE 10 % IV SOLN
1.0000 g | Freq: Once | INTRAVENOUS | Status: AC
  Administered 2024-07-13: 1 g via INTRAVENOUS
  Filled 2024-07-13: qty 10

## 2024-07-13 MED ORDER — LACTATED RINGERS IV BOLUS
2000.0000 mL | Freq: Once | INTRAVENOUS | Status: AC
  Administered 2024-07-13: 2000 mL via INTRAVENOUS

## 2024-07-13 MED ORDER — SODIUM ZIRCONIUM CYCLOSILICATE 10 G PO PACK
10.0000 g | PACK | Freq: Once | ORAL | Status: AC
  Administered 2024-07-13: 10 g via ORAL
  Filled 2024-07-13: qty 1

## 2024-07-13 NOTE — Consult Note (Signed)
 Pharmacy Antibiotic Note  Kristin Carson is a 53 y.o. female admitted on 07/13/2024 with sepsis,of unknown origin. Patient with AKI serum creatine at 2.48, BL at 0.8-1. Pharmacy has been consulted for Vancomycin and Cefepime dosing.  Plan: Vancomycin 1750 mg IV LD given @ 1812 . Will order random level 24 hours after LD dose and monitor renal function to guide further dosing. Goal trough 10-15 mcg/mL. Cefepime 2G q24h   Height: 5' 1 (154.9 cm) Weight: 76.4 kg (168 lb 6.9 oz) IBW/kg (Calculated) : 47.8  Temp (24hrs), Avg:96 F (35.6 C), Min:94.2 F (34.6 C), Max:97.6 F (36.4 C)  Recent Labs  Lab 07/13/24 1602 07/13/24 1805  WBC 23.5*  --   CREATININE 2.48*  --   LATICACIDVEN 6.2* 4.4*    Estimated Creatinine Clearance: 25.1 mL/min (A) (by C-G formula based on SCr of 2.48 mg/dL (H)).    Allergies  Allergen Reactions   Carrot [Daucus Carota] Hives   Lisinopril -Hydrochlorothiazide  Swelling    Oral swelling   Erythromycin Hives   Ibuprofen  Swelling    Oral swelling   Other Itching and Other (See Comments)    Hair dye, blisters, pus-filled, soreness   Tramadol  Hives    Antimicrobials this admission: 11/12 vancomycin >>  11/12 cefepime >>    Microbiology results:   Thank you for allowing pharmacy to be a part of this patient's care. Annabella LOISE Banks, PharmD Clinical Pharmacist 07/13/2024 10:18 PM

## 2024-07-13 NOTE — H&P (Addendum)
 History and Physical    Kristin Carson FMW:[id number] DOB: 1971/08/22 DOA: 07/13/2024  PCP: [name], FNP   Patient coming from: Home  I have personally briefly reviewed patient's old medical records in [facility]  Chief Complaint: Weakness  HPI: Kristin Carson is a 53 y.o. female with medical history significant for CKD, COPD, hypertension, cervical cancer with metastasis, depression. Patient was brought to the ED with reports of weakness.  Patient reports that she was in the bathroom trying to have a bowel movement when she fell on the floor, and could not get up.  EMS was called, CBG was 60, blood pressure was 60/30. She reports an episode of vomiting about 2 days ago.  She has been constipated, no diarrhea, last bowel movement was like 3 days ago.  She reports progressive difficulty breathing.  Reports abdominal distention and pain.  No chest pain.  Recently hospitalized 10/24 to 11/4-patient left AMA.  Managed for acute hypoxic respiratory failure secondary to left pleural effusion, also acute metabolic encephalopathy, AKI, anemia, underwent thoracentesis 11/3.  ED Course: Hypothermic - temperature 94.2.  Heart rate 99-112.  Respiratory rate 20-26.  Blood pressure systolic 79-115.  O2 sats 92% on 4 L.  Sodium 127. Potassium 6.2. Creatinine 2.48. AST 183, ALP 187. Lactic acid 6.2.  Leukocytosis of 23.5. 3 L bolus given.  D50- 25mls  given.  Calcium  gluconate given, albuterol  nebs, sodium bicarb 50 mEq given. CT chest abdomen and pelvis Wo contrast-interval improvement in multifocal airspace disease seen on CT chest, unchanged large loculated left pleural effusion, new moderate volume malignant ascites with extensive peritoneal and omental metastatic disease, significant interval enlargement of multiple hypodense liver mets IV Vancomycin metronidazole  and cefepime started.  Review of Systems: As per HPI all other systems reviewed and negative.  Past Medical History:   Diagnosis Date   Anemia    Asthma    Bronchitis    Cancer (HCC)    Cervical   CHF (congestive heart failure) (HCC)    COPD (chronic obstructive pulmonary disease) (HCC)    Depression    Dyspnea    GERD (gastroesophageal reflux disease)    Headache    Hypertension    Port-A-Cath in place 09/25/2022    Past Surgical History:  Procedure Laterality Date   BREAST SURGERY     CESAREAN SECTION     IR IMAGING GUIDED PORT INSERTION  09/30/2022   LEG SURGERY     VIDEO BRONCHOSCOPY WITH ENDOBRONCHIAL NAVIGATION Bilateral 06/07/2024   Procedure: VIDEO BRONCHOSCOPY WITH ENDOBRONCHIAL NAVIGATION;  Surgeon: Isadora Hose, MD;  Location: [facility] ORS;  Service: Pulmonary;  Laterality: Bilateral;     reports that she has been smoking cigarettes. She started smoking about 7 weeks ago. She has a 0.1 pack-year smoking history. She has never used smokeless tobacco. She reports current alcohol  use. She reports current drug use. Drug: Marijuana.  Allergies  Allergen Reactions   Carrot [Daucus Carota] Hives   Lisinopril -Hydrochlorothiazide  Swelling    Oral swelling   Erythromycin Hives   Ibuprofen  Swelling    Oral swelling   Other Itching and Other (See Comments)    Hair dye, blisters, pus-filled, soreness   Tramadol  Hives    Family History  Problem Relation Age of Onset   Dermatomyositis Mother    Hypertension Mother    Cancer Mother        lung   Cancer Father        lung   Heart disease Father  Lung cancer Paternal Uncle    Lung cancer Maternal Uncle    Colon cancer Neg Hx    Breast cancer Neg Hx    Ovarian cancer Neg Hx    Endometrial cancer Neg Hx    Pancreatic cancer Neg Hx    Prostate cancer Neg Hx     Prior to Admission medications   Medication Sig Start Date End Date Taking? Authorizing Provider  albuterol  (VENTOLIN  HFA) 108 (90 Base) MCG/ACT inhaler Inhale 2 puffs into the lungs every 6 (six) hours as needed for wheezing or shortness of breath. 05/25/24  Yes Dgayli,  Belva, MD  amLODipine  (NORVASC ) 10 MG tablet Take 1 tablet (10 mg total) by mouth daily. 05/30/24  Yes Kandala, Hyndavi, MD  budesonide -formoterol  (SYMBICORT ) 160-4.5 MCG/ACT inhaler Inhale 2 puffs into the lungs in the morning and at bedtime. 05/25/24  Yes Dgayli, Belva, MD  fentaNYL  (DURAGESIC ) 12 MCG/HR Place 1 patch onto the skin every 3 (three) days. 07/06/24  Yes [provider]  GAS RELIEF 80 MG chewable tablet Chew 80 mg by mouth every 6 (six) hours as needed for flatulence. 07/11/24  Yes [provider]  magnesium  oxide (MAG-OX) 400 (240 Mg) MG tablet Take 1 tablet (400 mg total) by mouth 3 (three) times daily. 03/24/24  Yes [name], MD  oxyCODONE  (OXY IR/ROXICODONE ) 5 MG immediate release tablet Take 1 tablet (5 mg total) by mouth every 6 (six) hours as needed for severe pain (pain score 7-10). 05/27/24  Yes Burns, Delon BRAVO, NP  polyethylene glycol powder (GLYCOLAX/MIRALAX) 17 GM/SCOOP powder Take 17 g by mouth daily. 07/06/24  Yes [provider]  pregabalin  (LYRICA ) 200 MG capsule Take 1 capsule (200 mg total) by mouth 3 (three) times daily. 05/27/24  Yes Burns, Delon BRAVO, NP  SENNA-S 8.6-50 MG tablet Take 1 tablet by mouth 2 (two) times daily. 07/13/24  Yes [provider]  sodium phosphate  (FLEET) ENEM Place 1 enema rectally daily. 07/13/24  Yes [provider]  Tiotropium Bromide Monohydrate  (SPIRIVA  RESPIMAT) 2.5 MCG/ACT AERS Inhale 2 puffs into the lungs daily. 05/25/24  Yes Isadora Belva, MD    Physical Exam: Vitals:   07/13/24 1715 07/13/24 1722 07/13/24 1743 07/13/24 1858  BP: 90/75  (!) 115/90   Pulse: (!) 103  (!) 104   Resp: (!) 25  (!) 23   Temp:  (!) 94.8 F (34.9 C)    TempSrc:  Rectal    SpO2: 92%  92% 92%  Weight:      Height:        Constitutional: Warming blanket in place, acutely and chronically ill-appearing Vitals:   07/13/24 1715 07/13/24 1722 07/13/24 1743 07/13/24 1858  BP: 90/75  (!) 115/90    Pulse: (!) 103  (!) 104   Resp: (!) 25  (!) 23   Temp:  (!) 94.8 F (34.9 C)    TempSrc:  Rectal    SpO2: 92%  92% 92%  Weight:      Height:       Eyes: PERRL, lids and conjunctivae normal ENMT: Mucous membranes aredr  Neck: normal, supple, no masses, no thyromegaly Respiratory: No crackles or wheezing.  Mild increased work of breathing. Cardiovascular: Tachycardic, regular rate and rhythm, no murmurs / rubs / gallops.  Trace bilateral pitting lower extremity edema.  Extremities warm.  Port-A-Cath present, no sign of infection. Abdomen: no tenderness, no masses palpated. No hepatosplenomegaly. Bowel sounds positive.  Musculoskeletal: no clubbing / cyanosis. No joint deformity upper and  lower extremities.  Skin: no rashes, lesions, ulcers. No induration Neurologic: Voice quite high-pitched and squeaky, patient reports her voice is intermittently like this..  Otherwise no facial asymmetry, moving all extremities spontaneously. Psychiatric: Normal judgment and insight. Alert and oriented x 3. Normal mood.   Labs on Admission: I have personally reviewed following labs and imaging studies  CBC: Recent Labs  Lab 07/13/24 1602  WBC 23.5*  NEUTROABS 18.6*  HGB 9.6*  HCT 28.2*  MCV 81.3  PLT 150   Basic Metabolic Panel: Recent Labs  Lab 07/13/24 1602  NA 127*  K 6.2*  CL 87*  CO2 25  GLUCOSE 93  BUN 32*  CREATININE 2.48*  CALCIUM  8.0*   GFR: Estimated Creatinine Clearance: 25.4 mL/min (A) (by C-G formula based on SCr of 2.48 mg/dL (H)). Liver Function Tests: Recent Labs  Lab 07/13/24 1602  AST 183*  ALT 24  ALKPHOS 187*  BILITOT 1.2  PROT 5.9*  ALBUMIN 2.4*   Coagulation Profile: Recent Labs  Lab 07/13/24 1602  INR 1.8*   BNP (last 3 results) Recent Labs    06/24/24 0355 07/03/24 0825  PROBNP 723.0* 485.0*   HbA1C: No results for input(s): HGBA1C in the last 72 hours. CBG: Recent Labs  Lab 07/13/24 1503  GLUCAP 159*   Urine analysis:     Component Value Date/Time   COLORURINE AMBER (A) 02/06/2024 1540   APPEARANCEUR CLOUDY (A) 02/06/2024 1540   LABSPEC 1.024 02/06/2024 1540   PHURINE 5.0 02/06/2024 1540   GLUCOSEU NEGATIVE 02/06/2024 1540   HGBUR NEGATIVE 02/06/2024 1540   BILIRUBINUR NEGATIVE 02/06/2024 1540   KETONESUR NEGATIVE 02/06/2024 1540   PROTEINUR 100 (A) 02/06/2024 1540   UROBILINOGEN 0.2 02/11/2013 2027   NITRITE NEGATIVE 02/06/2024 1540   LEUKOCYTESUR NEGATIVE 02/06/2024 1540    Radiological Exams on Admission: CT CHEST ABDOMEN PELVIS WO CONTRAST Result Date: 07/13/2024 CLINICAL DATA:  Sepsis, abdominal pain and distention, history of metastatic cervical cancer, source search * Tracking Code: BO * EXAM: CT CHEST, ABDOMEN AND PELVIS WITHOUT CONTRAST TECHNIQUE: Multidetector CT imaging of the chest, abdomen and pelvis was performed following the standard protocol without IV contrast. RADIATION DOSE REDUCTION: This exam was performed according to the departmental dose-optimization program which includes automated exposure control, adjustment of the mA and/or kV according to patient size and/or use of iterative reconstruction technique. COMPARISON:  CT chest, 07/03/2024, CT chest abdomen pelvis, 04/25/2024 FINDINGS: CT CHEST FINDINGS Cardiovascular: Right chest port catheter. Scattered aortic atherosclerosis. Normal heart size. No pericardial effusion. Mediastinum/Nodes: No enlarged mediastinal, hilar, or axillary lymph nodes. Thyroid  gland, trachea, and esophagus demonstrate no significant findings. Lungs/Pleura: When compared to recent chest CT dated 07/03/2024, significant interval improvement in multifocal airspace disease seen on prior chest CT, particularly in the left lung. Multiple bilateral pulmonary masses and nodules are unchanged. Underlying centrilobular emphysema and diffuse bilateral bronchial wall thickening. Unchanged large, loculated appearing left pleural effusion with soft tissue nodularity and  pleural thickening. Musculoskeletal: No chest wall abnormality. No acute osseous findings. CT ABDOMEN PELVIS FINDINGS Hepatobiliary: When compared to restaging CT of the abdomen pelvis dated 04/25/2024, significant interval enlargement of multiple hypodense liver metastases, in the superior left lobe of the liver measuring 8.1 x 8.1 cm, previously 3.2 x 2.9 cm (series 2, image 42) no gallstones, gallbladder wall thickening, or biliary dilatation. Pancreas: Unremarkable. No pancreatic ductal dilatation or surrounding inflammatory changes. Spleen: Normal in size without significant abnormality. Adrenals/Urinary Tract: Adrenal glands are unremarkable. Kidneys are normal, without renal calculi,  solid lesion, or hydronephrosis. Bladder is unremarkable. Stomach/Bowel: Stomach is within normal limits. Appendix appears normal. No evidence of bowel wall thickening, distention, or inflammatory changes. Vascular/Lymphatic: Aortic atherosclerosis. No enlarged abdominal or pelvic lymph nodes. Reproductive: Uterine fundus remains distended by fluid, presumably secondary to obstruction of the cervical os. Cervical mass not directly visible. Other: No abdominal wall hernia. Anasarca. When compared to prior restaging examination, new moderate volume ascites with extensive peritoneal and omental thickening and nodularity Musculoskeletal: No acute osseous findings. IMPRESSION: 1. When compared to recent chest CT dated 07/03/2024, significant interval improvement in multifocal airspace disease seen on prior chest CT, particularly in the left lung. Multiple bilateral pulmonary masses and nodules are unchanged. 2. Unchanged large, loculated appearing left pleural effusion with extensive pleural thickening and soft tissue nodularity. 3. When compared to prior restaging examination of the abdomen and pelvis dated 04/25/2024, new moderate volume malignant ascites with extensive peritoneal and omental metastatic disease. 4. When compared to  prior restaging examination, significant interval enlargement of multiple hypodense liver metastases. 5. Uterine fundus remains distended by fluid, presumably secondary to obstruction of the cervical os. Cervical mass not directly visible. Aortic Atherosclerosis (ICD10-I70.0) and Emphysema (ICD10-J43.9). Electronically Signed   By: Marolyn JONETTA Jaksch M.D.   On: 07/13/2024 18:06   DG Chest Port 1 View if patient is in a treatment room. Result Date: 07/13/2024 EXAM: 1 VIEW(S) XRAY OF THE CHEST 07/13/2024 04:33:29 PM COMPARISON: 07/04/2024 CLINICAL HISTORY: Suspected Sepsis FINDINGS: LINES, TUBES AND DEVICES: Right sided chest port place with tip overlying the cavoatrial junction region. LUNGS AND PLEURA: Persistent large left pleural effusion with little significant interval change. Associated compressive atelectasis in left lower lung zone. Masslike consolidation in the left suprahilar region with nodular opacities along the right lateral lung base, also unchanged. These findings are similar to metastatic disease. Low lung volumes. No pneumothorax. HEART AND MEDIASTINUM: No acute abnormality of the cardiac and mediastinal silhouettes. BONES AND SOFT TISSUES: No acute osseous abnormality. IMPRESSION: 1. Low lung volumes. Unchanged, large left pleural effusion with associated compressive atelectasis in the left lower lung zone. 2. Similar masslike consolidation in the left suprahilar region with metastatic disease noted again in the right lung base. Electronically signed by: Rogelia Myers MD 07/13/2024 05:17 PM EST RP Workstation: [id number]   EKG: Independently reviewed.  Sinus tachycardia rate 106, QTc 413.  No significant change from prior.  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  Presenting with hypothermia-temperature 94.2, hypotension blood pressure 79/59, leukocytosis of 23.5, tachycardic heart rate 99-113.  With evidence of endorgan dysfunction, significant lactic acidosis of 6.2 >> 4.4.  Mental status intact. - UA still pending - CTA chest shows improvement in multifocal pneumonia. - Abdomen distended and tender, with increased ascites, need to rule out spontaneous bacterial peritonitis - Continue IV vancomycin cefepime and metronidazole  - Patient confirms full code status to me, previously told EDP otherwise. - Follow-up blood cultures - 3 L bolus given, continue D5 N/S 75 cc/hour x 15 hours - Patient is quite ill, with advanced disease, would benefit from palliative care, and goals of care discussions.  Acute hypoxic respiratory failure-O2 sats 92% on 4  L, currently on high flow nasal cannula 8 L.  CT chest with Contrast-unchanged large loculated appearing left pleural effusion, multiple bilateral pulmonary masses and nodules unchanged.  Significant ascites likely also contributing to dyspnea. - Will consult IR for thoracentesis tomorrow - Also would benefit from paracentesis  Hypoglycemia-glucose down to 28.  Likely 2/2 poor oral intake over the past few days, with vomiting.  Not diabetic. - D50 -25 mL and then 50 mL given, - Continue D5 N/s 75cc/hr x 15hrs - CBG Q1 hourly  Malignant ascites-abdomen distended and tender, likely contributing to dyspnea and hypoxia.  CT today shows new moderate volume malignant ascites. - Paracentesis in a.m. with fluid analysis to include cultures  AKI, with hyperkalemia - creatinine 2.48, baseline 0.8-1.  Potassium 6.2. - hydrate -Sodium bicarbonate 50 meq, albuterol  nebs, calcium  gluconate given. - Lokelma 10 mg x 1  Hyponatremia sodium 127.  Left pleural effusion-last thoracentesis 11/3, yielded 350 mL of  loculated pleural fluid.   -Repeat thoracentesis  Metastatic cervical cancer- with elevated liver enzymes, CT today showing extensive peritoneal and omental metastatic disease, significant interval enlargement of multiple hypodense liver mets, multiple bilateral pulmonary masses and nodules unchanged. - Seen by Dr. Davonna 10/30 while patient was hospitalized, per notes patient currently on fourth line of chemotherapy with single agent gemcitabine, patient at that time refused further discussions about her cancer, deferred discussions to outpatient setting.  Hypertension-hold Norvasc   DVT prophylaxis: SCDs for now pending thoracentesis tomorrow Code Status: Full code-confirmed with patient at bedside. Family Communication: Spouse at bedside Disposition Plan: > 2 days Consults called: Palliative care Admission status: Inpt stepdown I certify that at the point of admission it is my clinical judgment that the patient will require inpatient hospital care spanning beyond 2 midnights from the point of admission due to high intensity of service, high risk for further deterioration and high frequency of surveillance required.    CRITICAL CARE Performed by: Tully FORBES Carwin   Total critical care time: 70 minutes  Critical care time was exclusive of separately billable procedures and treating other patients.  Critical care was necessary to treat or prevent imminent or life-threatening deterioration.  Critical care was time spent personally by me on the following activities: development of treatment plan with patient and/or surrogate as well as nursing, discussions with consultants, evaluation of patient's response to treatment, examination of patient, obtaining history from patient or surrogate, ordering and performing treatments and interventions, ordering and review of laboratory studies, ordering and review of radiographic studies, pulse oximetry and re-evaluation of patient's  condition.   Author: Tully FORBES Carwin, MD 07/13/2024 9:52 PM  For on call review [email or web address].

## 2024-07-13 NOTE — ED Triage Notes (Signed)
 Patinet BIB [facility] from home for complaint of Hypoglycemia. EMS got CBG of 60 BP of 60/30. D10 given IV. Paitent A&O x4. Patient also stated had not had a BM in 3 days and gave herself an Enema prior to EMS arriving. Having Abdominal pain. EMS noted patient cold to touch. Patient is a hospice receiving chemo for Cervical and lung cancer.

## 2024-07-13 NOTE — ED Notes (Signed)
 Bair hugger placed onto pt d/t rectal temp 94.2

## 2024-07-13 NOTE — Sepsis Progress Note (Signed)
 Elink will follow per sepsis protocol.

## 2024-07-13 NOTE — ED Notes (Signed)
 Patient transported to CT

## 2024-07-13 NOTE — ED Provider Notes (Addendum)
 [place] EMERGENCY DEPARTMENT AT [facility] Provider Note   CSN: [id number] Arrival date & time: 07/13/24  1455     Patient presents with: Abdominal Pain and Hypoglycemia   Kristin Carson is a 53 y.o. female.   Pt with hx metastatic cervical cancer (lungs, peritoneum, lymph nodes) who indicates intermittent nausea/vomiting in the past few days, not bloody or bilious. Last bm 3 days ago. No melena or rectal bleeding. No dysuria or gu c/o. No vaginal bleeding or discharge.  EMS was called to home today, cbg was low at 60 and bp also low. Pt notes recent poor po intake, nausea, anorexia. Denies fever, chills or sweats. No chest pain or discomfort. No leg pain or swelling. Occasional non prod cough, smoker. No sore throat or runny nose.   The history is provided by the patient, medical records and the EMS personnel.  Abdominal Pain Associated symptoms: nausea and vomiting   Associated symptoms: no chest pain, no chills, no dysuria, no fever, no shortness of breath, no sore throat and no vaginal bleeding   Hypoglycemia Associated symptoms: vomiting   Associated symptoms: no shortness of breath and no weakness        Prior to Admission medications   Medication Sig Start Date End Date Taking? Authorizing Provider  albuterol  (VENTOLIN  HFA) 108 (90 Base) MCG/ACT inhaler Inhale 2 puffs into the lungs every 6 (six) hours as needed for wheezing or shortness of breath. 05/25/24   Isadora Hose, MD  amLODipine  (NORVASC ) 10 MG tablet Take 1 tablet (10 mg total) by mouth daily. 05/30/24   Kandala, Hyndavi, MD  budesonide -formoterol  (SYMBICORT ) 160-4.5 MCG/ACT inhaler Inhale 2 puffs into the lungs in the morning and at bedtime. 05/25/24   Isadora Hose, MD  fentaNYL  (DURAGESIC ) 12 MCG/HR Place 1 patch onto the skin every 3 (three) days. 07/06/24   [provider]  GAS RELIEF 80 MG chewable tablet Chew 80 mg by mouth every 6 (six) hours as needed for flatulence. 07/11/24   [provider]  magnesium  oxide (MAG-OX) 400 (240 Mg) MG tablet Take 1 tablet (400 mg total) by mouth 3 (three) times daily. 03/24/24   [name], MD  oxyCODONE  (OXY IR/ROXICODONE ) 5 MG immediate release tablet Take 1 tablet (5 mg total) by mouth every 6 (six) hours as needed for severe pain (pain score 7-10). 05/27/24   Geofm Delon BRAVO, NP  polyethylene glycol powder (GLYCOLAX/MIRALAX) 17 GM/SCOOP powder Take 17 g by mouth daily. 07/06/24   [provider]  pregabalin  (LYRICA ) 200 MG capsule Take 1 capsule (200 mg total) by mouth 3 (three) times daily. 05/27/24   Geofm Delon BRAVO, NP  prochlorperazine  (COMPAZINE ) 10 MG tablet Take 1 tablet (10 mg total) by mouth every 6 (six) hours as needed. Patient not taking: Reported on 06/07/2024 05/30/24   Davonna Siad, MD  SENNA-S 8.6-50 MG tablet Take 1 tablet by mouth 2 (two) times daily. 07/13/24   [provider]  sodium phosphate  (FLEET) ENEM Place 1 enema rectally daily. 07/13/24   [provider]  Tiotropium Bromide Monohydrate  (SPIRIVA  RESPIMAT) 2.5 MCG/ACT AERS Inhale 2 puffs into the lungs daily. 05/25/24   Isadora Hose, MD    Allergies: Carrot [daucus carota], Lisinopril -hydrochlorothiazide , Ibuprofen , Other, Tramadol , and Erythromycin    Review of Systems  Constitutional:  Negative for chills and fever.  HENT:  Negative for sore throat.   Eyes:  Negative for redness.  Respiratory:  Negative for shortness of breath.   Cardiovascular:  Negative for chest pain and leg swelling.  Gastrointestinal:  Positive for abdominal pain, nausea and vomiting. Negative for blood in stool.  Genitourinary:  Negative for dysuria, flank pain and vaginal bleeding.  Musculoskeletal:  Negative for back pain and neck pain.  Skin:  Negative for rash.  Neurological:  Negative for weakness, numbness and headaches.    Updated Vital Signs BP (!) 115/90 (BP Location: Left Arm)   Pulse (!) 104   Temp (!) 94.8 F (34.9 C)  (Rectal)   Resp (!) 23   Ht 1.549 m (5' 1)   Wt 78.3 kg   LMP 08/15/2022 (Approximate)   SpO2 92%   BMI 32.62 kg/m   Physical Exam Vitals and nursing note reviewed.  Constitutional:      Appearance: Normal appearance. She is well-developed.  HENT:     Head: Atraumatic.     Nose: Nose normal.     Mouth/Throat:     Mouth: Mucous membranes are moist.  Eyes:     General: No scleral icterus.    Conjunctiva/sclera: Conjunctivae normal.     Pupils: Pupils are equal, round, and reactive to light.  Neck:     Trachea: No tracheal deviation.     Comments: Trachea midline. Thyroid  not grossly enlarged or tender. No neck stiffness or rigidity.  Cardiovascular:     Rate and Rhythm: Normal rate and regular rhythm.     Pulses: Normal pulses.     Heart sounds: Normal heart sounds. No murmur heard.    No friction rub. No gallop.  Pulmonary:     Effort: Pulmonary effort is normal. No respiratory distress.     Breath sounds: Normal breath sounds.     Comments: Port site right chest without sign of infection.  Abdominal:     General: Bowel sounds are normal. There is distension.     Palpations: Abdomen is soft.     Tenderness: There is abdominal tenderness.     Comments: Mild generalized tenderness.   Genitourinary:    Comments: No cva tenderness.  Musculoskeletal:        General: No swelling or tenderness.     Cervical back: Normal range of motion and neck supple. No rigidity. No muscular tenderness.     Right lower leg: No edema.     Left lower leg: No edema.  Skin:    General: Skin is warm and dry.     Findings: No rash.  Neurological:     Mental Status: She is alert.     Comments: Alert, speech normal. Motor/sens grossly intact bil.   Psychiatric:        Mood and Affect: Mood normal.     (all labs ordered are listed, but only abnormal results are displayed) Results for orders placed or performed during the hospital encounter of 07/13/24  CBG monitoring, ED   Collection  Time: 07/13/24  3:03 PM  Result Value Ref Range   Glucose-Capillary 159 (H) 70 - 99 mg/dL  Culture, blood (Routine x 2)   Collection Time: 07/13/24  3:37 PM   Specimen: BLOOD  Result Value Ref Range   Specimen Description BLOOD BLOOD LEFT HAND    Special Requests      BOTTLES DRAWN AEROBIC AND ANAEROBIC Blood Culture adequate volume Performed at [facility], [address], [place], [place] [address]    Culture PENDING    Report Status PENDING   Culture, blood (Routine x 2)   Collection Time: 07/13/24  4:02 PM  Specimen: BLOOD  Result Value Ref Range   Specimen Description BLOOD PORTA CATH    Special Requests      BOTTLES DRAWN AEROBIC AND ANAEROBIC Blood Culture results may not be optimal due to an inadequate volume of blood received in culture bottles Performed at [facility], [address], [place], [place] [address]    Culture PENDING    Report Status PENDING   Comprehensive metabolic panel   Collection Time: 07/13/24  4:02 PM  Result Value Ref Range   Sodium 127 (L) 135 - 145 mmol/L   Potassium 6.2 (H) 3.5 - 5.1 mmol/L   Chloride 87 (L) 98 - 111 mmol/L   CO2 25 22 - 32 mmol/L   Glucose, Bld 93 70 - 99 mg/dL   BUN 32 (H) 6 - 20 mg/dL   Creatinine, Ser 7.51 (H) 0.44 - 1.00 mg/dL   Calcium  8.0 (L) 8.9 - 10.3 mg/dL   Total Protein 5.9 (L) 6.5 - 8.1 g/dL   Albumin 2.4 (L) 3.5 - 5.0 g/dL   AST 816 (H) 15 - 41 U/L   ALT 24 0 - 44 U/L   Alkaline Phosphatase 187 (H) 38 - 126 U/L   Total Bilirubin 1.2 0.0 - 1.2 mg/dL   GFR, Estimated 23 (L) >60 mL/min   Anion gap 16 (H) 5 - 15  Lactic acid, plasma   Collection Time: 07/13/24  4:02 PM  Result Value Ref Range   Lactic Acid, Venous 6.2 (HH) 0.5 - 1.9 mmol/L  CBC with Differential   Collection Time: 07/13/24  4:02 PM  Result Value Ref Range   WBC 23.5 (H) 4.0 - 10.5 K/uL   RBC 3.47 (L) 3.87 - 5.11 MIL/uL   Hemoglobin 9.6 (L) 12.0 - 15.0 g/dL   HCT 71.7 (L) 63.9 - 53.9 %   MCV 81.3 80.0 - 100.0 fL   MCH 27.7 26.0 -  34.0 pg   MCHC 34.0 30.0 - 36.0 g/dL   RDW 78.4 (H) 88.4 - 84.4 %   Platelets 150 150 - 400 K/uL   nRBC 0.0 0.0 - 0.2 %   Neutrophils Relative % 77 %   Neutro Abs 18.6 (H) 1.7 - 7.7 K/uL   Band Neutrophils 2 %   Lymphocytes Relative 7 %   Lymphs Abs 1.6 0.7 - 4.0 K/uL   Monocytes Relative 3 %   Monocytes Absolute 0.7 0.1 - 1.0 K/uL   Eosinophils Relative 0 %   Eosinophils Absolute 0.0 0.0 - 0.5 K/uL   Basophils Relative 0 %   Basophils Absolute 0.0 0.0 - 0.1 K/uL   Smear Review Normal platelet morphology    Metamyelocytes Relative 7 %   Myelocytes 4 %   Abs Immature Granulocytes 2.60 (H) 0.00 - 0.07 K/uL   Reactive, Benign Lymphocytes PRESENT    Target Cells PRESENT   Protime-INR   Collection Time: 07/13/24  4:02 PM  Result Value Ref Range   Prothrombin Time 22.1 (H) 11.4 - 15.2 seconds   INR 1.8 (H) 0.8 - 1.2   CT CHEST ABDOMEN PELVIS WO CONTRAST Result Date: 07/13/2024 CLINICAL DATA:  Sepsis, abdominal pain and distention, history of metastatic cervical cancer, source search * Tracking Code: BO * EXAM: CT CHEST, ABDOMEN AND PELVIS WITHOUT CONTRAST TECHNIQUE: Multidetector CT imaging of the chest, abdomen and pelvis was performed following the standard protocol without IV contrast. RADIATION DOSE REDUCTION: This exam was performed according to the departmental dose-optimization program which includes automated exposure control, adjustment of the  mA and/or kV according to patient size and/or use of iterative reconstruction technique. COMPARISON:  CT chest, 07/03/2024, CT chest abdomen pelvis, 04/25/2024 FINDINGS: CT CHEST FINDINGS Cardiovascular: Right chest port catheter. Scattered aortic atherosclerosis. Normal heart size. No pericardial effusion. Mediastinum/Nodes: No enlarged mediastinal, hilar, or axillary lymph nodes. Thyroid  gland, trachea, and esophagus demonstrate no significant findings. Lungs/Pleura: When compared to recent chest CT dated 07/03/2024, significant interval  improvement in multifocal airspace disease seen on prior chest CT, particularly in the left lung. Multiple bilateral pulmonary masses and nodules are unchanged. Underlying centrilobular emphysema and diffuse bilateral bronchial wall thickening. Unchanged large, loculated appearing left pleural effusion with soft tissue nodularity and pleural thickening. Musculoskeletal: No chest wall abnormality. No acute osseous findings. CT ABDOMEN PELVIS FINDINGS Hepatobiliary: When compared to restaging CT of the abdomen pelvis dated 04/25/2024, significant interval enlargement of multiple hypodense liver metastases, in the superior left lobe of the liver measuring 8.1 x 8.1 cm, previously 3.2 x 2.9 cm (series 2, image 42) no gallstones, gallbladder wall thickening, or biliary dilatation. Pancreas: Unremarkable. No pancreatic ductal dilatation or surrounding inflammatory changes. Spleen: Normal in size without significant abnormality. Adrenals/Urinary Tract: Adrenal glands are unremarkable. Kidneys are normal, without renal calculi, solid lesion, or hydronephrosis. Bladder is unremarkable. Stomach/Bowel: Stomach is within normal limits. Appendix appears normal. No evidence of bowel wall thickening, distention, or inflammatory changes. Vascular/Lymphatic: Aortic atherosclerosis. No enlarged abdominal or pelvic lymph nodes. Reproductive: Uterine fundus remains distended by fluid, presumably secondary to obstruction of the cervical os. Cervical mass not directly visible. Other: No abdominal wall hernia. Anasarca. When compared to prior restaging examination, new moderate volume ascites with extensive peritoneal and omental thickening and nodularity Musculoskeletal: No acute osseous findings. IMPRESSION: 1. When compared to recent chest CT dated 07/03/2024, significant interval improvement in multifocal airspace disease seen on prior chest CT, particularly in the left lung. Multiple bilateral pulmonary masses and nodules are  unchanged. 2. Unchanged large, loculated appearing left pleural effusion with extensive pleural thickening and soft tissue nodularity. 3. When compared to prior restaging examination of the abdomen and pelvis dated 04/25/2024, new moderate volume malignant ascites with extensive peritoneal and omental metastatic disease. 4. When compared to prior restaging examination, significant interval enlargement of multiple hypodense liver metastases. 5. Uterine fundus remains distended by fluid, presumably secondary to obstruction of the cervical os. Cervical mass not directly visible. Aortic Atherosclerosis (ICD10-I70.0) and Emphysema (ICD10-J43.9). Electronically Signed   By: Marolyn JONETTA Jaksch M.D.   On: 07/13/2024 18:06   DG Chest Port 1 View if patient is in a treatment room. Result Date: 07/13/2024 EXAM: 1 VIEW(S) XRAY OF THE CHEST 07/13/2024 04:33:29 PM COMPARISON: 07/04/2024 CLINICAL HISTORY: Suspected Sepsis FINDINGS: LINES, TUBES AND DEVICES: Right sided chest port place with tip overlying the cavoatrial junction region. LUNGS AND PLEURA: Persistent large left pleural effusion with little significant interval change. Associated compressive atelectasis in left lower lung zone. Masslike consolidation in the left suprahilar region with nodular opacities along the right lateral lung base, also unchanged. These findings are similar to metastatic disease. Low lung volumes. No pneumothorax. HEART AND MEDIASTINUM: No acute abnormality of the cardiac and mediastinal silhouettes. BONES AND SOFT TISSUES: No acute osseous abnormality. IMPRESSION: 1. Low lung volumes. Unchanged, large left pleural effusion with associated compressive atelectasis in the left lower lung zone. 2. Similar masslike consolidation in the left suprahilar region with metastatic disease noted again in the right lung base. Electronically signed by: Rogelia Myers MD 07/13/2024 05:17 PM EST RP Workstation: [id number]  US  THORACENTESIS ASP PLEURAL SPACE  W/IMG GUIDE Result Date: 07/04/2024 INDICATION: Patient history metastatic lung cancer, new left loculated pleural effusion. Request for diagnostic and therapeutic left thoracentesis. EXAM: ULTRASOUND GUIDED LEFT THORACENTESIS MEDICATIONS: 5 mL 1% lidocaine  COMPLICATIONS: None immediate. PROCEDURE: An ultrasound guided thoracentesis was thoroughly discussed with the patient and her family members. All questions answered. The benefits, risks, alternatives and complications were also discussed. The patient understands and wishes to proceed with the procedure. Written consent was obtained. Ultrasound was performed to localize and mark an adequate pocket of fluid in the left chest. The area was then prepped and draped in the normal sterile fashion. 1% Lidocaine  was used for local anesthesia. Under ultrasound guidance a 6 Fr Safe-T-Centesis catheter was introduced. Thoracentesis was performed. The catheter was removed and a dressing applied. FINDINGS: A total of approximately 350 mL of turbid tan fluid fluid was removed - no further fluid was able to be removed due to loculated nature of effusion. Samples were sent to the laboratory as requested by the clinical team. IMPRESSION: Successful ultrasound guided left thoracentesis yielding 350 mL of loculated pleural fluid. Performed by Clotilda Hesselbach, PA-C Electronically Signed   By: Wilkie Lent M.D.   On: 07/04/2024 15:00   DG Chest Port 1 View Result Date: 07/04/2024 CLINICAL DATA:  Thoracentesis. EXAM: PORTABLE CHEST 1 VIEW COMPARISON:  07/03/2024 and CT chest 07/03/2024. FINDINGS: Trachea is midline. Heart size is grossly stable. Right IJ power port tip is in the SVC. Moderate to large left pleural effusion, decreased in size. No pneumothorax. Left upper and left lower lobe collapse/consolidation. Additional nodular consolidation along the minor fissure. Small right pleural effusion. IMPRESSION: 1. No pneumothorax status post left thoracentesis. Moderate to  large residual left pleural effusion. 2. Left upper and left lower lobe consolidation may represent a combination of metastatic disease and pneumonia. 3. Right lung metastases better evaluated on CT chest 07/03/2024. Electronically Signed   By: Newell Eke M.D.   On: 07/04/2024 14:14   CT CHEST WO CONTRAST Result Date: 07/04/2024 IMPRESSION: 1. Increasingly large loculated left pleural effusion now depressing the left hemidiaphragm and displacing the mediastinum to the right , with associated atelectasis/consolidation, obscuring previously noted left pleural-based metastases. 2. Progressive pleural-based metastases in the right middle lobe, largest now 4.4 x 2.4 cm, 2.9 x 1.7 cm, with additional slightly interval enlarged right middle lobe pleural metastases, and new small right pleural effusion, consistent with worsening intrathoracic metastatic disease. 3. Worsening multifocal airspace disease with increasing consolidation in the left upper lobe, complete compressive collapse/consolidation in the lingula and left lower lobe, increased posterior consolidation and scattered coarsely nodular infiltrates in the right upper lobe, and further increased posterior consolidation in the right middle lobe. 4. Mild interval enlargement of mediastinal and hilar lymph nodes, including a right paratracheal node now 1.0 cm and left prevascular and left hilar nodes up to 1.2 cm, with study limitations without contrast. 5. Multifocal liver metastases involving all segments except segment 1, without appreciable change in the largest lesions. 6. Centrilobular emphysema. 7. Aortic and coronary artery atherosclerosis chronically enlarged pulmonary trunk. 8. The Mosaic reporting platform only generated an impression for this report, but the above are the most significant findings. Electronically signed by: Francis Quam MD 07/04/2024 05:46 AM EST RP Workstation: [id number]   DG CHEST PORT 1 VIEW Result Date:  07/03/2024 CLINICAL DATA:  Shortness of breath EXAM: PORTABLE CHEST 1 VIEW COMPARISON:  Chest radiograph dated 07/01/2024 FINDINGS: Lines/tubes: Right chest wall port tip  projects over the superior cavoatrial junction. Lungs: Increased near-complete opacification of the left lung. Similar hazy/confluent right lower lung opacities. Pleura: Increased large left pleural effusion. Trace blunting of the right costophrenic angle. No pneumothorax. Heart/mediastinum: Heart borders are obscured. Bones: No acute osseous abnormality. IMPRESSION: 1. Increased large left pleural effusion with near-complete opacification of the left lung. 2. Similar hazy/confluent right lower lung opacities, corresponding to known metastases. Electronically Signed   By: Limin  Xu M.D.   On: 07/03/2024 13:28   DG CHEST PORT 1 VIEW Result Date: 07/01/2024 EXAM: 1 VIEW(S) XRAY OF THE CHEST 07/01/2024 04:21:00 AM COMPARISON: 06/24/2024 CLINICAL HISTORY: FINDINGS: LINES, TUBES AND DEVICES: Right chest Port-A-Cath in place with tip at superior cavoatrial junction, unchanged. LUNGS AND PLEURA: Increased left pleural effusion now large. Associated left basilar collapse. Right lower lung zone peripheral mass again noted. No pneumothorax. HEART AND MEDIASTINUM: No acute abnormality of the cardiac and mediastinal silhouettes. Stable cardiomegaly BONES AND SOFT TISSUES: No acute osseous abnormality. IMPRESSION: 1. Increased left pleural effusion, now large, with associated left basilar collapse. 2. Right lower lung zone peripheral mass again noted. Electronically signed by: Dorethia Molt MD 07/01/2024 04:50 AM EDT RP Workstation: [id number]    US  CHEST (PLEURAL EFFUSION) Result Date: 06/24/2024 CLINICAL DATA:  Patient with history of cervical cancer with metastases to the peritoneum and lungs, recent endobronchial washing pathology consistent with metastatic disease. Choose currently admitted with dyspnea and CTA shows new loculated left pleural  effusion. Request for possible left thoracentesis EXAM: BILATERAL CHEST ULTRASOUND COMPARISON:  CTA chest 06/24/2024, chest x-ray 1 view 06/24/2024 FINDINGS: Limited ultrasound of the left chest shows a small volume loculated pleural effusion, potential procedure window in the inferior portion is obscured by lung flap. The most superior portion of the pleural effusion is slightly larger however in accessible due to the scapula. Limited ultrasound of the right chest shows no pleural fluid. IMPRESSION: Limited ultrasound of the left chest shows a small volume loculated pleural effusion which is not accessible for safe thoracentesis. No procedure performed. Electronically Signed   By: CHRISTELLA.  Shick M.D.   On: 06/24/2024 09:23   CT Angio Chest PE W/Cm &/Or Wo Cm Result Date: 06/24/2024 EXAM: CTA of the Chest with contrast for PE 06/24/2024 05:07:25 AM TECHNIQUE: CTA of the chest was performed after the administration of intravenous contrast. Multiplanar reformatted images are provided for review. MIP images are provided for review. Automated exposure control, iterative reconstruction, and/or weight based adjustment of the mA/kV was utilized to reduce the radiation dose to as low as reasonably achievable. COMPARISON: 06/06/2024 CLINICAL HISTORY: Pulmonary embolism (PE) suspected, high prob; CP, SOB, metastatic cervical cancer. [facility] from home; Weak 3 days + SOB; Nausea + CP tonight; Stage 4 lung cancer FINDINGS: PULMONARY ARTERIES: Pulmonary arteries are adequately opacified for evaluation. No pulmonary embolism. The main pulmonary artery measures 3.4 cm. MEDIASTINUM: The heart is at the upper limits of normal in size. Coronary artery calcifications are present. Trace pericardial effusion. Prominent mediastinum. There is no acute abnormality of the thoracic aorta. LYMPH NODES: A left hilar lymph node is identified. No mediastinal or axillary lymphadenopathy. LUNGS AND PLEURA: Interval progression since the previous exam.  There has been interval development of a loculated left pleural effusion overlying the left lung with components extending over the left apex. Signs of pulmonary metastases with multiple pleural-based nodules and masses in both lungs. Index lesion within the lingula is again identified measuring 4.1 x 3.1 cm (image 68/8), previously 4.1 x 3.0  cm. Index lesion within the lateral right middle lobe measures 3.2 x 1.7 cm (image 67/14), previously 2.7 x 1.7 cm. Lesion within the superior segment of the left lower lobe measures 2.8 x 2.4 cm (image 37/14), previously 2.3 x 2.5 cm. Mild emphysema with diffuse bronchial wall thickening. UPPER ABDOMEN: Bilateral liver metastases are again noted. Dominant lesion within the posteromedial right hepatic lobe measures 7.7 x 5.8 cm (image 109/8), previously 7.2 x 4.9 cm. Lesion within the medial segment of the left lobe of liver measures 6.7 x 6.4 cm (image 110/8), previously 5.8 x 5.5 cm. SOFT TISSUES AND BONES: No acute bone or soft tissue abnormality. IMPRESSION: 1. No evidence of pulmonary embolism. 2. Interval progression of pulmonary metastases with multiple pleural-based nodules and masses in both lungs. 3. Interval development of a loculated left pleural effusion. Concerning for new malignant pleural effusion. 4. Bilateral liver metastases with interval progression. 5. Increased caliber of the main pulmonary artery suggestive of pulmonary artery hypertension. 6. Emphysema and coronary artery calcifications. Electronically signed by: Waddell Calk MD 06/24/2024 06:30 AM EDT RP Workstation: [id number]   DG Chest Port 1 View Result Date: 06/24/2024 CLINICAL DATA:  Shortness of breath, chest pain EXAM: PORTABLE CHEST 1 VIEW COMPARISON:  06/07/2024 FINDINGS: Cardiac shadow is stable. Right chest wall port is again seen. Soft tissue density along the right lateral chest wall is noted consistent with the known history of metastatic disease better visualized on the current  exam. The superior segment left lower lobe mass and lateral left basilar mass are noted as well. No new focal abnormality is noted. IMPRESSION: Multiple pulmonary metastatic lesions.  No acute abnormality noted. Electronically Signed   By: Oneil Devonshire M.D.   On: 06/24/2024 04:01    EKG: None  Radiology: CT CHEST ABDOMEN PELVIS WO CONTRAST Result Date: 07/13/2024 CLINICAL DATA:  Sepsis, abdominal pain and distention, history of metastatic cervical cancer, source search * Tracking Code: BO * EXAM: CT CHEST, ABDOMEN AND PELVIS WITHOUT CONTRAST TECHNIQUE: Multidetector CT imaging of the chest, abdomen and pelvis was performed following the standard protocol without IV contrast. RADIATION DOSE REDUCTION: This exam was performed according to the departmental dose-optimization program which includes automated exposure control, adjustment of the mA and/or kV according to patient size and/or use of iterative reconstruction technique. COMPARISON:  CT chest, 07/03/2024, CT chest abdomen pelvis, 04/25/2024 FINDINGS: CT CHEST FINDINGS Cardiovascular: Right chest port catheter. Scattered aortic atherosclerosis. Normal heart size. No pericardial effusion. Mediastinum/Nodes: No enlarged mediastinal, hilar, or axillary lymph nodes. Thyroid  gland, trachea, and esophagus demonstrate no significant findings. Lungs/Pleura: When compared to recent chest CT dated 07/03/2024, significant interval improvement in multifocal airspace disease seen on prior chest CT, particularly in the left lung. Multiple bilateral pulmonary masses and nodules are unchanged. Underlying centrilobular emphysema and diffuse bilateral bronchial wall thickening. Unchanged large, loculated appearing left pleural effusion with soft tissue nodularity and pleural thickening. Musculoskeletal: No chest wall abnormality. No acute osseous findings. CT ABDOMEN PELVIS FINDINGS Hepatobiliary: When compared to restaging CT of the abdomen pelvis dated 04/25/2024,  significant interval enlargement of multiple hypodense liver metastases, in the superior left lobe of the liver measuring 8.1 x 8.1 cm, previously 3.2 x 2.9 cm (series 2, image 42) no gallstones, gallbladder wall thickening, or biliary dilatation. Pancreas: Unremarkable. No pancreatic ductal dilatation or surrounding inflammatory changes. Spleen: Normal in size without significant abnormality. Adrenals/Urinary Tract: Adrenal glands are unremarkable. Kidneys are normal, without renal calculi, solid lesion, or hydronephrosis. Bladder is unremarkable. Stomach/Bowel: Stomach  is within normal limits. Appendix appears normal. No evidence of bowel wall thickening, distention, or inflammatory changes. Vascular/Lymphatic: Aortic atherosclerosis. No enlarged abdominal or pelvic lymph nodes. Reproductive: Uterine fundus remains distended by fluid, presumably secondary to obstruction of the cervical os. Cervical mass not directly visible. Other: No abdominal wall hernia. Anasarca. When compared to prior restaging examination, new moderate volume ascites with extensive peritoneal and omental thickening and nodularity Musculoskeletal: No acute osseous findings. IMPRESSION: 1. When compared to recent chest CT dated 07/03/2024, significant interval improvement in multifocal airspace disease seen on prior chest CT, particularly in the left lung. Multiple bilateral pulmonary masses and nodules are unchanged. 2. Unchanged large, loculated appearing left pleural effusion with extensive pleural thickening and soft tissue nodularity. 3. When compared to prior restaging examination of the abdomen and pelvis dated 04/25/2024, new moderate volume malignant ascites with extensive peritoneal and omental metastatic disease. 4. When compared to prior restaging examination, significant interval enlargement of multiple hypodense liver metastases. 5. Uterine fundus remains distended by fluid, presumably secondary to obstruction of the cervical os.  Cervical mass not directly visible. Aortic Atherosclerosis (ICD10-I70.0) and Emphysema (ICD10-J43.9). Electronically Signed   By: Marolyn JONETTA Jaksch M.D.   On: 07/13/2024 18:06   DG Chest Port 1 View if patient is in a treatment room. Result Date: 07/13/2024 EXAM: 1 VIEW(S) XRAY OF THE CHEST 07/13/2024 04:33:29 PM COMPARISON: 07/04/2024 CLINICAL HISTORY: Suspected Sepsis FINDINGS: LINES, TUBES AND DEVICES: Right sided chest port place with tip overlying the cavoatrial junction region. LUNGS AND PLEURA: Persistent large left pleural effusion with little significant interval change. Associated compressive atelectasis in left lower lung zone. Masslike consolidation in the left suprahilar region with nodular opacities along the right lateral lung base, also unchanged. These findings are similar to metastatic disease. Low lung volumes. No pneumothorax. HEART AND MEDIASTINUM: No acute abnormality of the cardiac and mediastinal silhouettes. BONES AND SOFT TISSUES: No acute osseous abnormality. IMPRESSION: 1. Low lung volumes. Unchanged, large left pleural effusion with associated compressive atelectasis in the left lower lung zone. 2. Similar masslike consolidation in the left suprahilar region with metastatic disease noted again in the right lung base. Electronically signed by: Rogelia Myers MD 07/13/2024 05:17 PM EST RP Workstation: [id number]     Procedures   Medications Ordered in the ED  lactated ringers  infusion (has no administration in time range)  vancomycin (VANCOREADY) IVPB 1750 mg/350 mL (1,750 mg Intravenous New Bag/Given 07/13/24 1812)  lactated ringers  bolus 1,000 mL (0 mLs Intravenous Stopped 07/13/24 1748)  ceFEPIme (MAXIPIME) 2 g in sodium chloride  0.9 % 100 mL IVPB (0 g Intravenous Stopped 07/13/24 1647)  metroNIDAZOLE  (FLAGYL ) IVPB 500 mg (0 mg Intravenous Stopped 07/13/24 1747)  calcium  gluconate inj 10% (1 g) URGENT USE ONLY! (1 g Intravenous Given 07/13/24 1808)  sodium bicarbonate  injection 50 mEq (50 mEq Intravenous Given 07/13/24 1803)  albuterol  (PROVENTIL ) (2.5 MG/3ML) 0.083% nebulizer solution 10 mg (10 mg Nebulization Given 07/13/24 1813)  dextrose  50 % solution 25 g (25 g Intravenous Given 07/13/24 1806)  lactated ringers  bolus 2,000 mL (2,000 mLs Intravenous Bolus from Bag 07/13/24 1810)                                    Medical Decision Making Problems Addressed: Acute hypotension: acute illness or injury with systemic symptoms that poses a threat to life or bodily functions AKI (acute kidney injury): acute illness or injury with  systemic symptoms that poses a threat to life or bodily functions Dehydration: acute illness or injury with systemic symptoms that poses a threat to life or bodily functions Elevated lactic acid level: acute illness or injury Hyperkalemia: acute illness or injury with systemic symptoms that poses a threat to life or bodily functions Hypoglycemia: acute illness or injury with systemic symptoms that poses a threat to life or bodily functions Hyponatremia: acute illness or injury Malignant ascites (HCC): chronic illness or injury with exacerbation, progression, or side effects of treatment that poses a threat to life or bodily functions Malignant pleural effusion (HCC): chronic illness or injury with exacerbation, progression, or side effects of treatment that poses a threat to life or bodily functions Metastasis from cervical cancer [facility]): chronic illness or injury with exacerbation, progression, or side effects of treatment that poses a threat to life or bodily functions  Amount and/or Complexity of Data Reviewed Independent Historian: EMS    Details: hx External Data Reviewed: notes. Labs: ordered. Decision-making details documented in ED Course. Radiology: ordered and independent interpretation performed. Decision-making details documented in ED Course. ECG/medicine tests: ordered and independent interpretation performed.  Decision-making details documented in ED Course. Discussion of management or test interpretation with external provider(s): Medicine, hospitalists  Risk Prescription drug management. Decision regarding hospitalization.   Iv ns. Continuous pulse ox and cardiac monitoring. Labs ordered/sent. Imaging ordered.   Differential diagnosis includes sepsis, dehydration, etc. Dispo decision including potential need for admission considered - will get labs and imaging and reassess.   Reviewed nursing notes and prior charts for additional history. External reports reviewed. Additional history from: EMS.  Initial bp low and initial blood glucose normal. LR bolus. Cultures sent. Iv abx. Code sepsis activated.   Cardiac monitor: sinus rhythm, rate 90.  Labs reviewed/interpreted by me - wbc high , 23, hct 38, na low, k high. Cal glu iv. Ivf. D50 iv. Hco3 iv. Albuterol  neb. Mild elev ast/alt, no ruq pain or tenderness.  Lactate high.   Xrays reviewed/interpreted by me - persistent/recurrent left effusion. No def pna.   CT reviewed/interpreted by me - progressive metastatic disease, ascites, pl effusion.   Additional ivf, total 3 liters. Bp is improved. Repeat lactate pending.   Hospitalists consulted for admission. Discussed pt , will admit.   CRITICAL CARE RE: hypotension, aki, dehydration, hyperkalemia, metastatic cancer.  Performed by: Sonyia Muro E Yiselle Babich Total critical care time: 90 minutes Critical care time was exclusive of separately billable procedures and treating other patients. Critical care was necessary to treat or prevent imminent or life-threatening deterioration. Critical care was time spent personally by me on the following activities: development of treatment plan with patient and/or surrogate as well as nursing, discussions with consultants, evaluation of patient's response to treatment, examination of patient, obtaining history from patient or surrogate, ordering and performing  treatments and interventions, ordering and review of laboratory studies, ordering and review of radiographic studies, pulse oximetry and re-evaluation of patient's condition.  I discussed w pt initially, and subsequently when labs back, re her wishes as relates intubation, vent, cpr - pt indicates would like medical treatment, but indicates does not want intubation/vent/cpr.        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

## 2024-07-14 ENCOUNTER — Other Ambulatory Visit: Payer: Self-pay

## 2024-07-14 ENCOUNTER — Inpatient Hospital Stay (HOSPITAL_COMMUNITY)

## 2024-07-14 DIAGNOSIS — C799 Secondary malignant neoplasm of unspecified site: Secondary | ICD-10-CM | POA: Diagnosis not present

## 2024-07-14 DIAGNOSIS — J90 Pleural effusion, not elsewhere classified: Secondary | ICD-10-CM | POA: Diagnosis not present

## 2024-07-14 DIAGNOSIS — R188 Other ascites: Secondary | ICD-10-CM

## 2024-07-14 DIAGNOSIS — Z515 Encounter for palliative care: Secondary | ICD-10-CM

## 2024-07-14 DIAGNOSIS — R6520 Severe sepsis without septic shock: Secondary | ICD-10-CM

## 2024-07-14 DIAGNOSIS — N179 Acute kidney failure, unspecified: Secondary | ICD-10-CM | POA: Diagnosis not present

## 2024-07-14 DIAGNOSIS — E872 Acidosis, unspecified: Secondary | ICD-10-CM

## 2024-07-14 DIAGNOSIS — R180 Malignant ascites: Secondary | ICD-10-CM | POA: Diagnosis not present

## 2024-07-14 DIAGNOSIS — A419 Sepsis, unspecified organism: Secondary | ICD-10-CM | POA: Diagnosis not present

## 2024-07-14 DIAGNOSIS — Z4682 Encounter for fitting and adjustment of non-vascular catheter: Secondary | ICD-10-CM | POA: Diagnosis not present

## 2024-07-14 LAB — URINALYSIS, ROUTINE W REFLEX MICROSCOPIC
Bilirubin Urine: NEGATIVE
Glucose, UA: 50 mg/dL — AB
Ketones, ur: NEGATIVE mg/dL
Nitrite: NEGATIVE
Protein, ur: 100 mg/dL — AB
RBC / HPF: >50 RBC/hpf (ref 0–5)
Specific Gravity, Urine: 1.017 (ref 1.005–1.030)
WBC, UA: >50 WBC/hpf (ref 0–5)
pH: 5 (ref 5.0–8.0)

## 2024-07-14 LAB — BLOOD GAS, ARTERIAL
Acid-Base Excess: 1.6 mmol/L (ref 0.0–2.0)
Bicarbonate: 28.9 mmol/L — ABNORMAL HIGH (ref 20.0–28.0)
Drawn by: 23430
O2 Saturation: 98.8 %
Patient temperature: 36.8
pCO2 arterial: 56 mmHg — ABNORMAL HIGH (ref 32–48)
pH, Arterial: 7.32 — ABNORMAL LOW (ref 7.35–7.45)
pO2, Arterial: 84 mmHg (ref 83–108)

## 2024-07-14 LAB — GLUCOSE, CAPILLARY
Glucose-Capillary: 61 mg/dL — ABNORMAL LOW (ref 70–99)
Glucose-Capillary: 114 mg/dL — ABNORMAL HIGH (ref 70–99)
Glucose-Capillary: 64 mg/dL — ABNORMAL LOW (ref 70–99)
Glucose-Capillary: 122 mg/dL — ABNORMAL HIGH (ref 70–99)
Glucose-Capillary: 105 mg/dL — ABNORMAL HIGH (ref 70–99)
Glucose-Capillary: 93 mg/dL (ref 70–99)
Glucose-Capillary: 159 mg/dL — ABNORMAL HIGH (ref 70–99)
Glucose-Capillary: 47 mg/dL — ABNORMAL LOW (ref 70–99)
Glucose-Capillary: 85 mg/dL (ref 70–99)
Glucose-Capillary: 98 mg/dL (ref 70–99)
Glucose-Capillary: 58 mg/dL — ABNORMAL LOW (ref 70–99)
Glucose-Capillary: 129 mg/dL — ABNORMAL HIGH (ref 70–99)
Glucose-Capillary: 84 mg/dL (ref 70–99)

## 2024-07-14 LAB — COMPREHENSIVE METABOLIC PANEL WITH GFR
ALT: 23 U/L (ref 0–44)
ALT: 24 U/L (ref 0–44)
ALT: 22 U/L (ref 0–44)
AST: 162 U/L — ABNORMAL HIGH (ref 15–41)
AST: 155 U/L — ABNORMAL HIGH (ref 15–41)
AST: 147 U/L — ABNORMAL HIGH (ref 15–41)
Albumin: 2.1 g/dL — ABNORMAL LOW (ref 3.5–5.0)
Albumin: 2.3 g/dL — ABNORMAL LOW (ref 3.5–5.0)
Albumin: 2 g/dL — ABNORMAL LOW (ref 3.5–5.0)
Alkaline Phosphatase: 169 U/L — ABNORMAL HIGH (ref 38–126)
Alkaline Phosphatase: 157 U/L — ABNORMAL HIGH (ref 38–126)
Alkaline Phosphatase: 143 U/L — ABNORMAL HIGH (ref 38–126)
Anion gap: 11 (ref 5–15)
Anion gap: 10 (ref 5–15)
Anion gap: 11 (ref 5–15)
BUN: 36 mg/dL — ABNORMAL HIGH (ref 6–20)
BUN: 38 mg/dL — ABNORMAL HIGH (ref 6–20)
BUN: 40 mg/dL — ABNORMAL HIGH (ref 6–20)
CO2: 27 mmol/L (ref 22–32)
CO2: 29 mmol/L (ref 22–32)
CO2: 28 mmol/L (ref 22–32)
Calcium: 7.2 mg/dL — ABNORMAL LOW (ref 8.9–10.3)
Calcium: 7.3 mg/dL — ABNORMAL LOW (ref 8.9–10.3)
Calcium: 7.5 mg/dL — ABNORMAL LOW (ref 8.9–10.3)
Chloride: 88 mmol/L — ABNORMAL LOW (ref 98–111)
Chloride: 91 mmol/L — ABNORMAL LOW (ref 98–111)
Chloride: 91 mmol/L — ABNORMAL LOW (ref 98–111)
Creatinine, Ser: 2.59 mg/dL — ABNORMAL HIGH (ref 0.44–1.00)
Creatinine, Ser: 2.68 mg/dL — ABNORMAL HIGH (ref 0.44–1.00)
Creatinine, Ser: 2.95 mg/dL — ABNORMAL HIGH (ref 0.44–1.00)
GFR, Estimated: 22 mL/min — ABNORMAL LOW (ref >60)
GFR, Estimated: 21 mL/min — ABNORMAL LOW (ref >60)
GFR, Estimated: 19 mL/min — ABNORMAL LOW (ref >60)
Glucose, Bld: 228 mg/dL — ABNORMAL HIGH (ref 70–99)
Glucose, Bld: 41 mg/dL — CL (ref 70–99)
Glucose, Bld: 81 mg/dL (ref 70–99)
Potassium: 6 mmol/L — ABNORMAL HIGH (ref 3.5–5.1)
Potassium: 6.1 mmol/L — ABNORMAL HIGH (ref 3.5–5.1)
Potassium: 5.7 mmol/L — ABNORMAL HIGH (ref 3.5–5.1)
Sodium: 127 mmol/L — ABNORMAL LOW (ref 135–145)
Sodium: 130 mmol/L — ABNORMAL LOW (ref 135–145)
Sodium: 129 mmol/L — ABNORMAL LOW (ref 135–145)
Total Bilirubin: 1 mg/dL (ref 0.0–1.2)
Total Bilirubin: 0.9 mg/dL (ref 0.0–1.2)
Total Bilirubin: 0.9 mg/dL (ref 0.0–1.2)
Total Protein: 5 g/dL — ABNORMAL LOW (ref 6.5–8.1)
Total Protein: 5.3 g/dL — ABNORMAL LOW (ref 6.5–8.1)
Total Protein: 5 g/dL — ABNORMAL LOW (ref 6.5–8.1)

## 2024-07-14 LAB — CBC
HCT: 23.3 % — ABNORMAL LOW (ref 36.0–46.0)
HCT: 23.8 % — ABNORMAL LOW (ref 36.0–46.0)
Hemoglobin: 7.8 g/dL — ABNORMAL LOW (ref 12.0–15.0)
Hemoglobin: 7.8 g/dL — ABNORMAL LOW (ref 12.0–15.0)
MCH: 27.4 pg (ref 26.0–34.0)
MCH: 27.3 pg (ref 26.0–34.0)
MCHC: 33.5 g/dL (ref 30.0–36.0)
MCHC: 32.8 g/dL (ref 30.0–36.0)
MCV: 81.8 fL (ref 80.0–100.0)
MCV: 83.2 fL (ref 80.0–100.0)
Platelets: 117 K/uL — ABNORMAL LOW (ref 150–400)
Platelets: 83 K/uL — ABNORMAL LOW (ref 150–400)
RBC: 2.85 MIL/uL — ABNORMAL LOW (ref 3.87–5.11)
RBC: 2.86 MIL/uL — ABNORMAL LOW (ref 3.87–5.11)
RDW: 21 % — ABNORMAL HIGH (ref 11.5–15.5)
RDW: 22.1 % — ABNORMAL HIGH (ref 11.5–15.5)
WBC: 18.3 K/uL — ABNORMAL HIGH (ref 4.0–10.5)
WBC: 20 K/uL — ABNORMAL HIGH (ref 4.0–10.5)
nRBC: 0 % (ref 0.0–0.2)
nRBC: 0.1 % (ref 0.0–0.2)

## 2024-07-14 LAB — LACTIC ACID, PLASMA
Lactic Acid, Venous: 2.5 mmol/L (ref 0.5–1.9)
Lactic Acid, Venous: 2 mmol/L (ref 0.5–1.9)
Lactic Acid, Venous: 2.6 mmol/L (ref 0.5–1.9)

## 2024-07-14 LAB — POTASSIUM: Potassium: 6.6 mmol/L (ref 3.5–5.1)

## 2024-07-14 LAB — LACTATE DEHYDROGENASE: LDH: 1074 U/L — ABNORMAL HIGH (ref 105–235)

## 2024-07-14 MED ORDER — SODIUM BICARBONATE 8.4 % IV SOLN
INTRAVENOUS | Status: AC
  Administered 2024-07-14: 50 meq via INTRAVENOUS
  Filled 2024-07-14: qty 50

## 2024-07-14 MED ORDER — SODIUM CHLORIDE (PF) 0.9 % IJ SOLN
10.0000 mg | Freq: Once | INTRAMUSCULAR | Status: AC
  Administered 2024-07-14: 10 mg via INTRAPLEURAL
  Filled 2024-07-14: qty 10

## 2024-07-14 MED ORDER — SODIUM ZIRCONIUM CYCLOSILICATE 10 G PO PACK
10.0000 g | PACK | Freq: Once | ORAL | Status: AC
  Administered 2024-07-14: 10 g via ORAL
  Filled 2024-07-14: qty 1

## 2024-07-14 MED ORDER — DEXTROSE 10 % IV SOLN: INTRAVENOUS | Status: DC

## 2024-07-14 MED ORDER — NOREPINEPHRINE 4 MG/250ML-% IV SOLN
0.0000 ug/min | INTRAVENOUS | Status: DC
  Administered 2024-07-14: 2 ug/min via INTRAVENOUS
  Administered 2024-07-15: 9 ug/min via INTRAVENOUS
  Filled 2024-07-14 (×2): qty 250

## 2024-07-14 MED ORDER — STERILE WATER FOR INJECTION IJ SOLN
5.0000 mg | Freq: Once | RESPIRATORY_TRACT | Status: AC
  Administered 2024-07-14: 5 mg via INTRAPLEURAL
  Filled 2024-07-14: qty 5

## 2024-07-14 MED ORDER — DEXTROSE 50 % IV SOLN
1.0000 | Freq: Once | INTRAVENOUS | Status: AC
  Administered 2024-07-14: 50 mL via INTRAVENOUS
  Filled 2024-07-14: qty 50

## 2024-07-14 MED ORDER — CALCIUM GLUCONATE-NACL 1-0.675 GM/50ML-% IV SOLN
INTRAVENOUS | Status: AC
  Administered 2024-07-14: 1000 mg via INTRAVENOUS
  Filled 2024-07-14: qty 50

## 2024-07-14 MED ORDER — ALBUTEROL SULFATE (2.5 MG/3ML) 0.083% IN NEBU
10.0000 mg | INHALATION_SOLUTION | Freq: Once | RESPIRATORY_TRACT | Status: AC
  Administered 2024-07-14: 10 mg via RESPIRATORY_TRACT
  Filled 2024-07-14: qty 12

## 2024-07-14 MED ORDER — SODIUM POLYSTYRENE SULFONATE 15 GM/60ML CO SUSP: 30.0000 g | Freq: Once | Status: DC

## 2024-07-14 MED ORDER — CALCIUM GLUCONATE-NACL 1-0.675 GM/50ML-% IV SOLN
1.0000 g | Freq: Once | INTRAVENOUS | Status: AC
  Filled 2024-07-14: qty 50

## 2024-07-14 MED ORDER — SODIUM CHLORIDE 0.9% FLUSH: 10.0000 mL | Freq: Three times a day (TID) | INTRAVENOUS | Status: DC

## 2024-07-14 MED ORDER — LIDOCAINE HCL (PF) 1 % IJ SOLN
INTRAMUSCULAR | Status: AC
  Filled 2024-07-14: qty 5

## 2024-07-14 MED ORDER — SODIUM ZIRCONIUM CYCLOSILICATE 10 G PO PACK: 10.0000 g | PACK | Freq: Three times a day (TID) | ORAL | Status: DC

## 2024-07-14 MED ORDER — SODIUM CHLORIDE 0.9 % IV BOLUS
500.0000 mL | Freq: Once | INTRAVENOUS | Status: AC
  Administered 2024-07-14: 500 mL via INTRAVENOUS

## 2024-07-14 MED ORDER — DEXTROSE 50 % IV SOLN: 25.0000 mL | Freq: Once | INTRAVENOUS | Status: AC

## 2024-07-14 MED ORDER — DEXTROSE 50 % IV SOLN
INTRAVENOUS | Status: AC
  Administered 2024-07-14: 25 g via INTRAVENOUS
  Filled 2024-07-14: qty 50

## 2024-07-14 MED ORDER — INSULIN ASPART 100 UNIT/ML IV SOLN
5.0000 [IU] | Freq: Once | INTRAVENOUS | Status: AC
  Filled 2024-07-14: qty 5

## 2024-07-14 MED ORDER — INSULIN ASPART 100 UNIT/ML IV SOLN
10.0000 [IU] | Freq: Once | INTRAVENOUS | Status: AC
  Administered 2024-07-14: 10 [IU] via INTRAVENOUS
  Filled 2024-07-14: qty 1

## 2024-07-14 MED ORDER — SODIUM CHLORIDE 0.9 % IV SOLN: INTRAVENOUS | Status: DC

## 2024-07-14 MED ORDER — SENNOSIDES-DOCUSATE SODIUM 8.6-50 MG PO TABS
1.0000 | ORAL_TABLET | Freq: Two times a day (BID) | ORAL | Status: DC
  Administered 2024-07-14: 1 via ORAL
  Filled 2024-07-14: qty 1

## 2024-07-14 MED ORDER — SODIUM BICARBONATE 8.4 % IV SOLN
50.0000 meq | Freq: Once | INTRAVENOUS | Status: AC
Start: 2024-07-14 — End: 2024-07-14
  Filled 2024-07-14: qty 50

## 2024-07-14 MED ORDER — DEXTROSE 50 % IV SOLN
12.5000 g | INTRAVENOUS | Status: AC
  Administered 2024-07-14: 12.5 g via INTRAVENOUS
  Filled 2024-07-14: qty 50

## 2024-07-14 MED ORDER — LIDOCAINE HCL 2 % IJ SOLN
INTRAMUSCULAR | Status: AC
  Filled 2024-07-14: qty 20

## 2024-07-14 MED ORDER — CALCIUM GLUCONATE 10 % IV SOLN
2.0000 g | Freq: Once | INTRAVENOUS | Status: DC
  Filled 2024-07-14: qty 20

## 2024-07-14 MED ORDER — MAGNESIUM OXIDE -MG SUPPLEMENT 400 (240 MG) MG PO TABS: 400.0000 mg | ORAL_TABLET | Freq: Three times a day (TID) | ORAL | Status: DC

## 2024-07-14 MED ORDER — SODIUM CHLORIDE 0.9 % IV SOLN
250.0000 mL | INTRAVENOUS | Status: DC
  Administered 2024-07-15: 250 mL via INTRAVENOUS

## 2024-07-14 MED ORDER — SODIUM CHLORIDE 0.9 % IV SOLN: INTRAVENOUS | Status: AC

## 2024-07-14 MED ORDER — ALBUMIN HUMAN 25 % IV SOLN
25.0000 g | Freq: Four times a day (QID) | INTRAVENOUS | Status: DC
  Administered 2024-07-14: 12.5 g via INTRAVENOUS
  Administered 2024-07-15: 25 g via INTRAVENOUS
  Administered 2024-07-15: 12.5 g via INTRAVENOUS
  Filled 2024-07-14 (×3): qty 100

## 2024-07-14 MED ORDER — CALCIUM GLUCONATE 10 % IV SOLN
1.0000 g | Freq: Once | INTRAVENOUS | Status: AC
  Administered 2024-07-14: 1 g via INTRAVENOUS
  Filled 2024-07-14: qty 10

## 2024-07-14 MED ORDER — INSULIN ASPART 100 UNIT/ML IJ SOLN
INTRAMUSCULAR | Status: AC
  Administered 2024-07-14: 5 [IU] via INTRAVENOUS
  Filled 2024-07-14: qty 5

## 2024-07-14 MED ORDER — SODIUM BICARBONATE 8.4 % IV SOLN: 50.0000 meq | Freq: Once | INTRAVENOUS | Status: AC

## 2024-07-14 MED ORDER — CALCIUM GLUCONATE 10 % IV SOLN: 1.0000 g | Freq: Once | INTRAVENOUS | Status: DC

## 2024-07-14 MED ORDER — DEXTROSE 50 % IV SOLN: 25.0000 g | INTRAVENOUS | Status: AC

## 2024-07-14 MED ORDER — DEXTROSE 50 % IV SOLN
INTRAVENOUS | Status: AC
  Administered 2024-07-14: 25 mL via INTRAVENOUS
  Filled 2024-07-14: qty 50

## 2024-07-14 MED ORDER — VANCOMYCIN HCL IN DEXTROSE 1-5 GM/200ML-% IV SOLN: 1000.0000 mg | INTRAVENOUS | Status: DC

## 2024-07-14 MED ORDER — INSULIN ASPART 100 UNIT/ML IV SOLN
5.0000 [IU] | Freq: Once | INTRAVENOUS | Status: AC
  Administered 2024-07-14: 5 [IU] via INTRAVENOUS
  Filled 2024-07-14: qty 1

## 2024-07-14 MED ORDER — DEXTROSE-SODIUM CHLORIDE 5-0.9 % IV SOLN: INTRAVENOUS | Status: DC

## 2024-07-14 NOTE — Progress Notes (Signed)
 Multiple attempts for ABG made without success despite doppler use. Hypotensive at this time. Elink made aware.

## 2024-07-14 NOTE — Progress Notes (Signed)
 PROGRESS NOTE    Patient: Kristin Carson                            PCP: [name], FNP                    DOB: February 28, 1971            DOA: 07/13/2024 FMW:[id number]             DOS: 07/14/2024, 11:22 AM   LOS: 1 day   Date of Service: The patient was seen and examined on 07/14/2024  Subjective:   The patient was seen and examined, somnolent, easily arousable, Tachycardia with heart rate 99-1 26, currently 104, respiratory rate as high as 28, currently 15, blood pressure is low 76/46, currently 102/68 satting 99% on 8 L of high flow oxygen  ABG reviewed: 7.3 2/56/20 8.9  Brief Narrative:   Kristin Carson is a 53 y.o. female with medical history significant for CKD, COPD, hypertension, cervical cancer with metastasis, depression. Patient was brought to the ED with reports of weakness.  Patient reports that she was in the bathroom trying to have a bowel movement when she fell on the floor, and could not get up.   EMS was called, CBG was 60, blood pressure was 60/30. She reports an episode of vomiting about 2 days ago.  She has been constipated, no diarrhea, last bowel movement was like 3 days ago.  She reports progressive difficulty breathing.  Reports abdominal distention and pain.  No chest pain.   Recently hospitalized 10/24 to 11/4-patient left AMA.  Managed for acute hypoxic respiratory failure secondary to left pleural effusion, also acute metabolic encephalopathy, AKI, anemia, underwent thoracentesis 11/3.   ED Course: Hypothermic - temperature 94.2.  Heart rate 99-112.  Respiratory rate 20-26.  Blood pressure systolic 79-115.  O2 sats 92% on 4 L.  Sodium 127. Potassium 6.2. Creatinine 2.48. AST 183, ALP 187. Lactic acid 6.2.  Leukocytosis of 23.5. 3 L bolus given.  D50- 25mls  given.  Calcium  gluconate given, albuterol  nebs, sodium bicarb 50 mEq given.  CT chest abdomen and pelvis Wo contrast-interval improvement in multifocal airspace disease seen on CT chest, unchanged large  loculated left pleural effusion, new moderate volume malignant ascites with extensive peritoneal and omental metastatic disease, significant interval enlargement of multiple hypodense liver mets IV Vancomycin metronidazole  and cefepime started.    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 [facility])   Essential hypertension   Port-A-Cath in place   Hyponatremia   Elevated liver enzymes   Acute hypotension   Metastasis from cervical cancer (HCC)   Dehydration   Hypoglycemia   Malignant ascites (HCC)      Severe sepsis -ruling out Septic shock-pneumonia/Bacteremia POA met sepsis criteria  - Source of infection pneumonia, now bacteremia - Pulmonary blood cultures growing gram-positive cocci-follow repeat blood cultures - Hemodynamically fairly stable Current vitals: Blood pressure 102/68, pulse (!) 104, temperature (!) 97.2 F (36.2 C),  Rectal, RR 15,  SpO2 99% on 8 L HFNC - Lactic acid 6.2, 4.4,>> 2.2 - ABG reviewed: 7.3 2/56/20 8.9 - Leukocytosis: WBC 23.5,  18.3, - Creatinine 2.48, 2.52, 2.59, - Elevated BNP 1074.0 -CBG 61>> 105 -S/p 3 L of bolus fluid, continuing with D5 normal saline . - UA still pending - CTA chest shows improvement in multifocal pneumonia. - Abdominal CT with increased ascites, likely secondary to progressive mets to liver, But need to rule out spontaneous bacterial peritonitis-pending thoracentesis  - Continue IV vancomycin cefepime and metronidazole   -Continue discussion with patient, her sister-remains full code-palliative care consulted  - Cussed the case in detail with pulmonary critical care Dr. Norleen Chill Recommended transfer to progressive unit [facility] -will  consult Will remain under [facility] service for now    Acute hypoxic respiratory failure- Secondary to pneumonia, recurrent pleural effusion, currently requiring 8 L HFNC - ABG reviewed: 7.3 2/56/20 8.9 - Continue DuoNeb bronchodilators, - CT chest with Contrast-unchanged large loculated appearing left pleural effusion, multiple bilateral pulmonary masses and nodules unchanged.  Significant ascites likely also contributing to dyspnea. - Will consult IR for thoracentesis - PCCM Dr. Dotti recommended to hold procedure for now till evaluated at [facility]  Ascites -Secondary to metastasis to liver, liver cirrhosis, yielding 2 ascites- -pending paracentesis-with fluid analysis ruling out infection  Persistent severe hyperkalemia with AKI  - Serum potassium 6.2, 6.2, 6.0 >>>  6.6 now -Status post treatment with sodium bicarb, albuterol , calcium  gluconate Lokelma 10 mg x 2 -Scheduling Lokelma 3 times daily, another dose of calcium  gluconate -Monitoring serum potassium level every 4 hours  Acute renal insufficiency - Due to severe sepsis, septic shock, hypotension, - Continue IV fluid hydration, avoiding nephrotoxins, - Unfortunately patient needs IV vancomycin due to bacteremia, gram-positive cocci     Hypoglycemia - Likely due to severe sepsis, poor p.o. intake associate with nausea vomiting -glucose down to 28.  Not diabetic, not on any hypoglycemic meds - D50 -25 mL and then 50 mL given, - Continue D5 N/s 75cc/hr x 15hrs - CBG Q1 hourly   Malignant ascites-abdomen distended and tender, likely contributing to dyspnea and hypoxia.  CT today shows new moderate volume malignant ascites. - Paracentesis in a.m. with fluid analysis to include cultures      Hyponatremia sodium 127, 128, 127   Left pleural effusion-last thoracentesis 11/3, yielded 350 mL of loculated pleural fluid.   -Repeat thoracentesis -pending repeat total pulmonary evaluation   Metastatic cervical cancer- with  elevated liver enzymes, CT today showing extensive peritoneal and omental metastatic disease, significant interval enlargement of multiple hypodense liver mets, multiple bilateral pulmonary masses and nodules unchanged. - Seen by Dr. Davonna 10/30 while patient was hospitalized, per notes patient currently on fourth line of chemotherapy with single agent gemcitabine, patient at that time refused further discussions about her cancer, deferred discussions to outpatient setting.   Hypertensive with history of hypertension-hold Norvasc     Note: Patient was recently put on hospice, revoked, patient and sister would like full scope of care to continue  Discussed with patient and her sister regarding transfer to [facility] to current plan  Palliative care consulted,-appreciate continued discussion regarding CODE STATUS and goals of care   ----------------------------------------------------------------------------------------------------------------------------------------------- Nutritional status:  The patient's BMI is: Body mass index is 31.82 kg/m. I agree with the assessment and plan as outlined  --------------------------------------------------------------------------------------------------------------- Cultures; Blood Cultures x 2 >> growing gram-positive cocci Repeat blood cultures ordered for 2024/07/20   Current antibiotics: Vancomycin/Flagyl /cefepime  ------------------------------------------------------------------------------------------------------------------------------------------------  DVT prophylaxis:  SCDs Start: 07/13/24 2149   Code Status:   Code Status: Full Code  Family Communication: No family member present at  bedside-  -Advance care planning has been discussed.   Admission status:   Status is: Inpatient Remains inpatient appropriate because: Needing aggressive treatment for severe sepsis, septic shock, IV fluid, IV antibiotics, treating hyperkalemia,  AKI,--excetra   Consult : PCCM/oncology/palliative care   disposition: From  - home             Planning for discharge in 2-5 days   Procedures:   No admission procedures for hospital encounter.   Antimicrobials:  Anti-infectives (From admission, onward)    Start     Dose/Rate Route Frequency Ordered Stop   07/16/24 1800  vancomycin (VANCOCIN) IVPB 1000 mg/200 mL premix        1,000 mg 200 mL/hr over 60 Minutes Intravenous Every 48 hours 07/14/24 0743     07/14/24 1600  ceFEPIme (MAXIPIME) 2 g in sodium chloride  0.9 % 100 mL IVPB        2 g 200 mL/hr over 30 Minutes Intravenous Every 24 hours 07/13/24 2211     07/14/24 0300  metroNIDAZOLE  (FLAGYL ) IVPB 500 mg        500 mg 100 mL/hr over 60 Minutes Intravenous Every 12 hours 07/13/24 2148     07/13/24 1530  ceFEPIme (MAXIPIME) 2 g in sodium chloride  0.9 % 100 mL IVPB        2 g 200 mL/hr over 30 Minutes Intravenous  Once 07/13/24 1522 07/13/24 1647   07/13/24 1530  metroNIDAZOLE  (FLAGYL ) IVPB 500 mg        500 mg 100 mL/hr over 60 Minutes Intravenous  Once 07/13/24 1522 07/13/24 1747   07/13/24 1530  vancomycin (VANCOCIN) IVPB 1000 mg/200 mL premix  Status:  Discontinued        1,000 mg 200 mL/hr over 60 Minutes Intravenous  Once 07/13/24 1522 07/13/24 1525   07/13/24 1530  vancomycin (VANCOREADY) IVPB 1750 mg/350 mL        1,750 mg 175 mL/hr over 120 Minutes Intravenous  Once 07/13/24 1525 07/13/24 2029        Medication:   calcium  gluconate  2 g Intravenous Once   Chlorhexidine  Gluconate Cloth  6 each Topical Q0600   polyethylene glycol  17 g Oral BID   sodium zirconium cyclosilicate  10 g Oral TID    acetaminophen  **OR** acetaminophen , ondansetron  **OR** ondansetron  (ZOFRAN ) IV, oxyCODONE    Objective:   Vitals:   07/14/24 0723 07/14/24 0729 07/14/24 0800 07/14/24 0900  BP:   104/66 102/68  Pulse:      Resp: 12  (!) 23 15  Temp:  (!) 97.2 F (36.2 C)    TempSrc:  Rectal    SpO2:      Weight:       Height:        Intake/Output Summary (Last 24 hours) at 07/14/2024 1122 Last data filed at 07/14/2024 0331 Gross per 24 hour  Intake 1768.63 ml  Output --  Net 1768.63 ml   Filed Weights   07/13/24 1505 07/13/24 2035  Weight: 78.3 kg 76.4 kg     Physical examination:   General:  Somnolent but arousable -AAO x 2,  cooperative, no distress;   HEENT:  Normocephalic, PERRL, otherwise with in Normal limits   Neuro:  CNII-XII intact. , normal motor and sensation, reflexes intact   Lungs:   Clear to auscultation BL, Respirations unlabored,  No wheezes / crackles  Cardio:    S1/S2, RRR, No murmure, No Rubs or Gallops   Abdomen:  Distended, soft, positive for  fluid shifts, no guarding or peritoneal signs.  Muscular  skeletal:  Limited exam -global generalized weaknesses - in bed, able to move all 4 extremities,   2+ pulses,  symmetric, No pitting edema  Skin:  Dry, warm to touch, negative for any Rashes,  Wounds: Please see nursing documentation        ---------------------------------------------------------------------------------------------------------------------    LABs:     Latest Ref Rng & Units 07/14/2024    4:41 AM 07/13/2024    4:02 PM 07/05/2024    4:40 AM  CBC  WBC 4.0 - 10.5 K/uL 18.3  23.5  10.7   Hemoglobin 12.0 - 15.0 g/dL 7.8  9.6  8.0   Hematocrit 36.0 - 46.0 % 23.3  28.2  24.6   Platelets 150 - 400 K/uL 117  150  522       Latest Ref Rng & Units 07/14/2024   10:02 AM 07/14/2024    4:41 AM 07/13/2024   10:30 PM  CMP  Glucose 70 - 99 mg/dL  771  73   BUN 6 - 20 mg/dL  36  33   Creatinine 9.55 - 1.00 mg/dL  7.40  7.47   Sodium 864 - 145 mmol/L  127  128   Potassium 3.5 - 5.1 mmol/L 6.6  6.0  6.2   Chloride 98 - 111 mmol/L  88  88   CO2 22 - 32 mmol/L  27  27   Calcium  8.9 - 10.3 mg/dL  7.2  7.8   Total Protein 6.5 - 8.1 g/dL  5.0    Total Bilirubin 0.0 - 1.2 mg/dL  1.0    Alkaline Phos 38 - 126 U/L  169    AST 15 - 41 U/L  162    ALT 0 -  44 U/L  23      Micro Results Recent Results (from the past 240 hours)  Culture, blood (Routine x 2)     Status: None (Preliminary result)   Collection Time: 07/13/24  3:37 PM   Specimen: BLOOD  Result Value Ref Range Status   Specimen Description BLOOD BLOOD LEFT HAND  Final   Special Requests   Final    BOTTLES DRAWN AEROBIC AND ANAEROBIC Blood Culture adequate volume   Culture  Setup Time   Final    IN BOTH AEROBIC AND ANAEROBIC BOTTLES GRAM POSITIVE COCCI GRAM NEGATIVE RODS Gram Stain Report Called to,Read Back By and Verified With: O PUCKETT AT 0827 ON 11.13.25 BY ADGER J  NOTIFIED PHARMACIST LORI POOLE OF CORRECTION GS AT 0827 AM ON [id number] BY THOMPSON S. Performed at [facility], [address], [place], [place] [address]    Culture GRAM POSITIVE COCCI GRAM NEGATIVE RODS  Final   Report Status PENDING  Incomplete  Culture, blood (Routine x 2)     Status: None (Preliminary result)   Collection Time: 07/13/24  4:02 PM   Specimen: BLOOD  Result Value Ref Range Status   Specimen Description BLOOD PORTA CATH  Final   Special Requests   Final    BOTTLES DRAWN AEROBIC AND ANAEROBIC Blood Culture results may not be optimal due to an inadequate volume of blood received in culture bottles   Culture  Setup Time   Final    GRAM POSITIVE COCCI AEROBIC BOTTLE ONLY Gram Stain Report Called to,Read Back By and Verified With: O. PUCKETT [id number] Performed at [facility], [address], [place], [place] [address]    Culture [facility]  Final  Report Status PENDING  Incomplete  Resp panel by RT-PCR (RSV, Flu A&B, Covid) Anterior Nasal Swab     Status: None   Collection Time: 07/13/24  7:27 PM   Specimen: Anterior Nasal Swab  Result Value Ref Range Status   SARS Coronavirus 2 by RT PCR NEGATIVE NEGATIVE Final    Comment: (NOTE) SARS-CoV-2 target nucleic acids are NOT DETECTED.  The SARS-CoV-2 RNA is generally detectable in upper respiratory specimens during the acute  phase of infection. The lowest concentration of SARS-CoV-2 viral copies this assay can detect is 138 copies/mL. A negative result does not preclude SARS-Cov-2 infection and should not be used as the sole basis for treatment or other patient management decisions. A negative result may occur with  improper specimen collection/handling, submission of specimen other than nasopharyngeal swab, presence of viral mutation(s) within the areas targeted by this assay, and inadequate number of viral copies(<138 copies/mL). A negative result must be combined with clinical observations, patient history, and epidemiological information. The expected result is Negative.  Fact Sheet for Patients:  bloggercourse.com  Fact Sheet for Healthcare Providers:  seriousbroker.it  This test is no t yet approved or cleared by the [place]  FDA and  has been authorized for detection and/or diagnosis of SARS-CoV-2 by FDA under an Emergency Use Authorization (EUA). This EUA will remain  in effect (meaning this test can be used) for the duration of the COVID-19 declaration under Section 564(b)(1) of the Act, 21 U.S.C.section 360bbb-3(b)(1), unless the authorization is terminated  or revoked sooner.       Influenza A by PCR NEGATIVE NEGATIVE Final   Influenza B by PCR NEGATIVE NEGATIVE Final    Comment: (NOTE) The Xpert Xpress SARS-CoV-2/FLU/RSV plus assay is intended as an aid in the diagnosis of influenza from Nasopharyngeal swab specimens and should not be used as a sole basis for treatment. Nasal washings and aspirates are unacceptable for Xpert Xpress SARS-CoV-2/FLU/RSV testing.  Fact Sheet for Patients: bloggercourse.com  Fact Sheet for Healthcare Providers: seriousbroker.it  This test is not yet approved or cleared by the [place]  FDA and has been authorized for detection and/or diagnosis of  SARS-CoV-2 by FDA under an Emergency Use Authorization (EUA). This EUA will remain in effect (meaning this test can be used) for the duration of the COVID-19 declaration under Section 564(b)(1) of the Act, 21 U.S.C. section 360bbb-3(b)(1), unless the authorization is terminated or revoked.     Resp Syncytial Virus by PCR NEGATIVE NEGATIVE Final    Comment: (NOTE) Fact Sheet for Patients: bloggercourse.com  Fact Sheet for Healthcare Providers: seriousbroker.it  This test is not yet approved or cleared by the [place]  FDA and has been authorized for detection and/or diagnosis of SARS-CoV-2 by FDA under an Emergency Use Authorization (EUA). This EUA will remain in effect (meaning this test can be used) for the duration of the COVID-19 declaration under Section 564(b)(1) of the Act, 21 U.S.C. section 360bbb-3(b)(1), unless the authorization is terminated or revoked.  Performed at [facility], [address] [place], [place] [address]   MRSA Next Gen by PCR, Nasal     Status: None   Collection Time: 07/13/24  9:15 PM   Specimen: Nasal Mucosa; Nasal Swab  Result Value Ref Range Status   MRSA by PCR Next Gen NOT DETECTED NOT DETECTED Final    Comment: (NOTE) The GeneXpert MRSA Assay (FDA approved for NASAL specimens only), is one component of a comprehensive MRSA colonization surveillance program. It is not intended to  diagnose MRSA infection nor to guide or monitor treatment for MRSA infections. Test performance is not FDA approved in patients less than 36 years old. Performed at [facility], [address], [place], [place] [address]     Radiology Reports DG CHEST PORT 1 VIEW Result Date: 07/14/2024 CLINICAL DATA:  Pleural effusion. EXAM: PORTABLE CHEST 1 VIEW COMPARISON:  07/13/2024 FINDINGS: Of 737 hours. The cardio pericardial silhouette is enlarged. Low lung volumes. Left base collapse/consolidation is similar to prior.  Interval increase in right base collapse/consolidation. Right Port-A-Cath again noted. Telemetry leads overlie the chest. IMPRESSION: 1. Interval increase in right base collapse/consolidation. 2. Persistent left base collapse/consolidation. Electronically Signed   By: Camellia Candle M.D.   On: 07/14/2024 07:45   US  EKG SITE RITE Result Date: 07/14/2024 If Site Rite image not attached, placement could not be confirmed due to current cardiac rhythm.  CT CHEST ABDOMEN PELVIS WO CONTRAST Result Date: 07/13/2024 CLINICAL DATA:  Sepsis, abdominal pain and distention, history of metastatic cervical cancer, source search * Tracking Code: BO * EXAM: CT CHEST, ABDOMEN AND PELVIS WITHOUT CONTRAST TECHNIQUE: Multidetector CT imaging of the chest, abdomen and pelvis was performed following the standard protocol without IV contrast. RADIATION DOSE REDUCTION: This exam was performed according to the departmental dose-optimization program which includes automated exposure control, adjustment of the mA and/or kV according to patient size and/or use of iterative reconstruction technique. COMPARISON:  CT chest, 07/03/2024, CT chest abdomen pelvis, 04/25/2024 FINDINGS: CT CHEST FINDINGS Cardiovascular: Right chest port catheter. Scattered aortic atherosclerosis. Normal heart size. No pericardial effusion. Mediastinum/Nodes: No enlarged mediastinal, hilar, or axillary lymph nodes. Thyroid  gland, trachea, and esophagus demonstrate no significant findings. Lungs/Pleura: When compared to recent chest CT dated 07/03/2024, significant interval improvement in multifocal airspace disease seen on prior chest CT, particularly in the left lung. Multiple bilateral pulmonary masses and nodules are unchanged. Underlying centrilobular emphysema and diffuse bilateral bronchial wall thickening. Unchanged large, loculated appearing left pleural effusion with soft tissue nodularity and pleural thickening. Musculoskeletal: No chest wall  abnormality. No acute osseous findings. CT ABDOMEN PELVIS FINDINGS Hepatobiliary: When compared to restaging CT of the abdomen pelvis dated 04/25/2024, significant interval enlargement of multiple hypodense liver metastases, in the superior left lobe of the liver measuring 8.1 x 8.1 cm, previously 3.2 x 2.9 cm (series 2, image 42) no gallstones, gallbladder wall thickening, or biliary dilatation. Pancreas: Unremarkable. No pancreatic ductal dilatation or surrounding inflammatory changes. Spleen: Normal in size without significant abnormality. Adrenals/Urinary Tract: Adrenal glands are unremarkable. Kidneys are normal, without renal calculi, solid lesion, or hydronephrosis. Bladder is unremarkable. Stomach/Bowel: Stomach is within normal limits. Appendix appears normal. No evidence of bowel wall thickening, distention, or inflammatory changes. Vascular/Lymphatic: Aortic atherosclerosis. No enlarged abdominal or pelvic lymph nodes. Reproductive: Uterine fundus remains distended by fluid, presumably secondary to obstruction of the cervical os. Cervical mass not directly visible. Other: No abdominal wall hernia. Anasarca. When compared to prior restaging examination, new moderate volume ascites with extensive peritoneal and omental thickening and nodularity Musculoskeletal: No acute osseous findings. IMPRESSION: 1. When compared to recent chest CT dated 07/03/2024, significant interval improvement in multifocal airspace disease seen on prior chest CT, particularly in the left lung. Multiple bilateral pulmonary masses and nodules are unchanged. 2. Unchanged large, loculated appearing left pleural effusion with extensive pleural thickening and soft tissue nodularity. 3. When compared to prior restaging examination of the abdomen and pelvis dated 04/25/2024, new moderate volume malignant ascites with extensive peritoneal and omental metastatic disease.  4. When compared to prior restaging examination, significant interval  enlargement of multiple hypodense liver metastases. 5. Uterine fundus remains distended by fluid, presumably secondary to obstruction of the cervical os. Cervical mass not directly visible. Aortic Atherosclerosis (ICD10-I70.0) and Emphysema (ICD10-J43.9). Electronically Signed   By: Marolyn JONETTA Jaksch M.D.   On: 07/13/2024 18:06   DG Chest Port 1 View if patient is in a treatment room. Result Date: 07/13/2024 EXAM: 1 VIEW(S) XRAY OF THE CHEST 07/13/2024 04:33:29 PM COMPARISON: 07/04/2024 CLINICAL HISTORY: Suspected Sepsis FINDINGS: LINES, TUBES AND DEVICES: Right sided chest port place with tip overlying the cavoatrial junction region. LUNGS AND PLEURA: Persistent large left pleural effusion with little significant interval change. Associated compressive atelectasis in left lower lung zone. Masslike consolidation in the left suprahilar region with nodular opacities along the right lateral lung base, also unchanged. These findings are similar to metastatic disease. Low lung volumes. No pneumothorax. HEART AND MEDIASTINUM: No acute abnormality of the cardiac and mediastinal silhouettes. BONES AND SOFT TISSUES: No acute osseous abnormality. IMPRESSION: 1. Low lung volumes. Unchanged, large left pleural effusion with associated compressive atelectasis in the left lower lung zone. 2. Similar masslike consolidation in the left suprahilar region with metastatic disease noted again in the right lung base. Electronically signed by: Rogelia Myers MD 07/13/2024 05:17 PM EST RP Workstation: [id number]    SIGNED: Adriana DELENA Grams, MD, FHM. FAAFP. [place] [facility] Critical care time spent - 75 min.  In seeing, evaluating and examining the patient. Reviewing medical records, labs, drawn plan of care. [facility],  Pager (please use [email or web address] to page/ text) Please use Epic Secure Chat for non-urgent communication (7AM-7PM)  If 7PM-7AM, please contact night-coverage [email or web address], 07/14/2024, 11:22  AM

## 2024-07-14 NOTE — Progress Notes (Signed)
   07/14/24 1412  Spiritual Encounters  Type of Visit Follow up  Care provided to: Patient;Significant other (Husband Kristin Carson)  Conversation partners present during encounter Nurse  Referral source IDT Rounds  Reason for visit Urgent spiritual support  OnCall Visit No   Reason for Visit: Chaplain responding to referral from clinical team informing me that one of our [facility] Pt's was hospitalized   Description of Visit: Upon entering the room I found Kristin Carson in the bed asleep with no support person present.  When I spoke to Pt she responded by opening her eyes but could not focus on me and she did not try to speak.  Her eyes fell closed again after a few seconds.  Repeated attempts to communicate were met with similar results.   Pt's husband had called my office phone and left a voicemail asking for an update on his wife.  I did not immediately respond as I know this Pt and during her previous admission she had requested to clinical staff that husband not be allowed to visit her and that no information be given to him.  I spoke with the RN caring for Pt and she informed me that right now Pt's daughter is POA and that husband is still not allowed to visit.  I returned call to husband informing him that due to HIPAA laws I was not allowed to share any information with him including whether his wife was admitted or not, and what her condition may be.  He was upset but respectful.   RN informs me that Pt is being transferred to [facility].  I will follow with her until transfer is made.    Kristin Carson, MDiv  Chaplain, [facility] [email or web address] [phone]  07/12/24 1420

## 2024-07-14 NOTE — Hospital Course (Signed)
 Kristin Carson is a 53 y.o. female with medical history significant for CKD, COPD, hypertension, cervical cancer with metastasis, depression. Patient was brought to the ED with reports of weakness.  Patient reports that she was in the bathroom trying to have a bowel movement when she fell on the floor, and could not get up.   EMS was called, CBG was 60, blood pressure was 60/30. She reports an episode of vomiting about 2 days ago.  She has been constipated, no diarrhea, last bowel movement was like 3 days ago.  She reports progressive difficulty breathing.  Reports abdominal distention and pain.  No chest pain.   Recently hospitalized 10/24 to 11/4-patient left AMA.  Managed for acute hypoxic respiratory failure secondary to left pleural effusion, also acute metabolic encephalopathy, AKI, anemia, underwent thoracentesis 11/3.   ED Course: Hypothermic - temperature 94.2.  Heart rate 99-112.  Respiratory rate 20-26.  Blood pressure systolic 79-115.  O2 sats 92% on 4 L.  Sodium 127. Potassium 6.2. Creatinine 2.48. AST 183, ALP 187. Lactic acid 6.2.  Leukocytosis of 23.5. 3 L bolus given.  D50- 25mls  given.  Calcium  gluconate given, albuterol  nebs, sodium bicarb 50 mEq given.  CT chest abdomen and pelvis Wo contrast-interval improvement in multifocal airspace disease seen on CT chest, unchanged large loculated left pleural effusion, new moderate volume malignant ascites with extensive peritoneal and omental metastatic disease, significant interval enlargement of multiple hypodense liver mets IV Vancomycin metronidazole  and cefepime started.

## 2024-07-14 NOTE — Progress Notes (Signed)
 Spoke with Zoe O, RN [facility] ICU regarding PICC insertion.  Patient has just left [facility] via [facility] and will not arrive on [facility] campus before PICC services end for the day.  Per Zoe patient has port accessed and PIV.  Instructed to consult IV team if additional access needs to be placed before PICC placed.

## 2024-07-14 NOTE — Procedures (Signed)
 Pleural Fibrinolytic Administration Procedure Note  JENSYN SHAVE  [id number]  1971-04-21  Date:07/14/24  Time:7:09 PM   Provider Performing:Ahmet Schank D. Harris   Procedure: Pleural Fibrinolysis Initial day (67438)  Indication(s) Fibrinolysis of complicated pleural effusion  Consent Risks of the procedure as well as the alternatives and risks of each were explained to the patient and/or caregiver.  Consent for the procedure was obtained.   Anesthesia None   Time Out Verified patient identification, verified procedure, site/side was marked, verified correct patient position, special equipment/implants available, medications/allergies/relevant history reviewed, required imaging and test results available.   Sterile Technique Hand hygiene, gloves   Procedure Description Existing pleural catheter was cleaned and accessed in sterile manner.  10mg  of tPA in 30cc of saline and 5mg  of dornase in 30cc of sterile water were injected into pleural space using existing pleural catheter.  Catheter will be clamped for 1 hour and then placed back to suction.   Complications/Tolerance None; patient tolerated the procedure well.  EBL None   Specimen(s) None  Aubriee Szeto D. Harris, NP-C  Pulmonary & Critical Care Personal contact information can be found on Amion  If no contact or response made please call [phone] 07/14/2024, 7:09 PM

## 2024-07-14 NOTE — Progress Notes (Signed)
 eLink Physician-Brief Progress Note Patient Name: Kristin Carson DOB: [birth year] MRN: [id number]   Date of Service  07/14/2024  HPI/Events of Note  Hypoglycemia. Patient needs Cortrak  enteral tube for medication delivery and feeding access.  eICU Interventions  D 5 % NS gtt ordered. Cortrak ordered.        Kristin Carson 07/14/2024, 9:31 PM

## 2024-07-14 NOTE — Progress Notes (Signed)
 Report called to [facility] at [facility]. Carelink called at this time.   Spoke with HCPOA at bedside. Husband has been calling and requesting information, due to history no information given but at this point HCPOA ok to give information if he should call back.

## 2024-07-14 NOTE — Consult Note (Signed)
 Consultation Note Date: 07/14/2024   Patient Name: Kristin Carson  DOB: 12/17/1970  MRN: [id number]  Age / Sex: 53 y.o., female  PCP: [name], FNP Referring Physician: Willette Adriana LABOR, MD  Reason for Consultation:  needs hospice  HPI/Patient Profile: 53 y.o. female  with past medical history of  CKD, COPD, HTN, cervical cancer with mets to omentum, peritoneal met, liver, lungs- s/p several regimens of chemotherapy- last was on Gemzar prior to admission in late October for plueral effusion and pneumonia pneumonia during which she left AMA on 11/4. Now admitted on 07/13/2024 with severe sepsis, AKI, lactic acidosis, pneumonia,  that has developed into bacteremia. Palliative consulted for assistance with medical decision making.   Primary Decision Maker OTHER- called listed contact Katrina- she states that she is HCPOA- patient is married and has a daughter per chart review but no contact for husband or daughter is listed in demographics  Discussion: Chart reviewed including labs, progress notes, imaging from this and previous encounters.  CT scan CAP reviewed- noted worsening of liver mets, improving pneumonia, loculated pleural effusion, malignant ascites, peritoneal and omental mets.  Labs reviewed- blood cultures positive for gram positive cocci, Cr elevated- 2.52 and trending up, WBC up 18.3. Discussed with bedside RN. Patient has orders for transfer to [facility].  Per chart patient was on Hospice prior to admission, but it was revoked in the ED.  On eval patient is very somnolent. Opens her eyes briefly but doesn't answer my questions.  I called patient's contact Katrina who confirmed she was HCPOA.  Met with Katrina and Brionna at bedside. HCPOA document reviewed that shows patient's niece- Geraldene as 1st HCPOA and Katrina as second HCPOA.  Prior to admission family shares that Cailey was doing well at  home. Able to ambulate and ride in the car. She was eating. Geraldene was staying with her. She had accepted hospice and they understood that hospice was support for someone at end of life.  We discussed patient's current acute illness- her severe sepsis with multi organ failure, her worsening cancer. I shared that current interventions and or more aggressive interventions may temporarily reverse Aniza's course, however, she is likely to have quick decompensation again and that is IF she is able to recover from her current septic state.  Katrina and [place] shared good understanding of her critical state.  I discussed continued aggressive life prolonging measures vs transition to comfort measures. Discussed transition to comfort measures only which includes stopping IV fluids, antibiotics, labs and providing symptom management for SOB, anxiety, nausea, vomiting, and other symptoms of dying.  We also discussed her code status. Encouraged patient/family to consider DNR/DNI status understanding evidenced based poor outcomes in similar hospitalized patients, as the cause of the arrest is likely associated with chronic/terminal disease rather than a reversible acute cardio-pulmonary event.  Katrina attempted to reach patient's brother and daughter for further discussion. I spoke with patient's brother's wife Luke per Katrina's request.  Family decided to continue full scope and full code for now.  They are going to reach out to patient's daughter for further discussion.  I let them know that code status and plan of care can be changed to comfort measures at any time and that a Palliative provider will follow-up with them at [facility].   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  Constitutional:      General: She is not in acute distress.    Appearance: She is ill-appearing.  Cardiovascular:     Rate and Rhythm: Normal rate.  Pulmonary:     Breath sounds: Wheezing present.  Neurological:     Comments: somnolent     Vital Signs: BP 102/68   Pulse (!) 104   Temp 98.4 F (36.9 C) (Rectal)   Resp 15   Ht 5' 1 (1.549 m)   Wt 76.4 kg   LMP 08/15/2022 (Approximate)   SpO2 99%   BMI 31.82 kg/m  Pain Scale: 0-10   Pain Score: Asleep   SpO2: SpO2: 99 % O2 Device:SpO2: 99 % O2 Flow Rate: .O2 Flow Rate (L/min): 8 L/min  IO: Intake/output summary:  Intake/Output Summary (Last 24 hours) at 07/14/2024 1304 Last data filed at 07/14/2024 0331 Gross per 24 hour  Intake 1768.63 ml  Output --  Net 1768.63 ml    LBM:   Baseline Weight: Weight: 78.3 kg Most recent weight: Weight: 76.4 kg       Thank you for this consult. Palliative medicine will continue to follow and assist as needed.  Time Total: 90 minutes Signed by: Cassondra Stain, AGNP-C Palliative Medicine  Time includes:   Preparing to see the patient (e.g., review of tests) Obtaining and/or reviewing separately obtained  history Performing a medically necessary appropriate examination and/or evaluation Counseling and educating the patient/family/caregiver Ordering medications, tests, or procedures Referring and communicating with other health care professionals (when not reported separately) Documenting clinical information in the electronic or other health record Independently interpreting results (not reported separately) and communicating results to the patient/family/caregiver Care coordination (not reported separately) Clinical documentation   Please contact Palliative Medicine Team phone at [phone] for questions and concerns.  For individual provider: See Tracey

## 2024-07-14 NOTE — H&P (Addendum)
 NAME:  Kristin Carson, MRN:  [id number], DOB:  Jun 20, 1971, LOS: 1 ADMISSION DATE:  07/13/2024, CONSULTATION DATE:  07/14/2024 REFERRING MD:  TR[facility], CHIEF COMPLAINT:  Left pleural effusion    History of Present Illness:  Kristin Carson is a 53 y.o. female with a PMH significant for metastatic cervical cancer with mets to the lung, liver and peritoneum, COPD, HTN, and depression who presented to the ED at [facility] for complaints of weakness which resulted in a mechanical fall day of admission. On EMS arrval she was seen hypotensive and hypoglycemic. Workup on admission concerning for SIRS response with acute hypoxic respiratory failure resulting on admission to [facility].   AM 11/13 decision was made to transfer patient to [facility] for ongoing care and need for pleural intervention for management of loculated left pleural effusion.   Pertinent  Medical History  Metastatic cervical cancer with mets to the lung, liver and peritoneum, COPD, HTN, and depression  Significant Hospital Events: Including procedures, antibiotic start and stop dates in addition to other pertinent events   11/12 presented with weakness admitted for acute hypoxic respiratory failure with signs of sepsis, POA  11/13 presented from [facility] minimally responsive with mild hypotension and tachypnea  Interim History / Subjective:  Minimally responsive  Objective    Blood pressure 102/68, pulse (!) 104, temperature (!) 97.2 F (36.2 C), temperature source Rectal, resp. rate 15, height 5' 1 (1.549 m), weight 76.4 kg, last menstrual period 08/15/2022, SpO2 99%.        Intake/Output Summary (Last 24 hours) at 07/14/2024 0932 Last data filed at 07/14/2024 0331 Gross per 24 hour  Intake 1768.63 ml  Output --  Net 1768.63 ml   Filed Weights   07/13/24 1505 07/13/24 2035  Weight: 78.3 kg 76.4 kg    Examination: General: Acute on chronic ill-appearing deconditioned middle-aged female lying in bed in no acute distress HEENT: [place]/AT,  MM pink/moist, PERRL,  Neuro: Minimally responsive responds to painful stimuli only CV: s1s2 regular rate and rhythm, no murmur, rubs, or gallops,  PULM: Diminished left side, on high flow nasal cannula, tachypnea no increased work of breathing GI: soft, bowel sounds active in all 4 quadrants, non-tender, non-distended Extremities: warm/dry, no edema  Skin: no rashes or lesions   Resolved problem list   Assessment and Plan   Acute Hypoxic Respiratory Failure  Metastatic cervical cancer with mets to the lung, liver and peritoneum -CT chest with Contrast-unchanged large loculated appearing left pleural effusion, multiple bilateral pulmonary masses and nodules unchanged.  Significant ascites likely also contributing to dyspnea.  Loculated left pleural effusion  -Underwent thora per IR 11/3 with 350 mls of turbid light brown fluid removed, there were > 10,000 nucleated cells and 87 neutropils concerning for infected pleural space. Of note patient was bacteremic with Strep Pneumonia during that admission. Does not appear she was discharged with any antibiotics after leaving A[facility]  P: Currently protecting airway but mentation is poor, low threshold to intubate if she becomes hypoxic Smallbore chest tube placed for management of likely infected pleural space Routine chest tube care Will administer tPA/dornase tonight Obtain pleural fluid studies to include culture and cytology  Continue Cefepime, Flagyl , and Vancomycin  Aspiration precautions  Follow cultures  Continue supplemental oxygen    Sepsis likely secondary to persistent/recurrent Pseudomonas bacteremia related to left empyema, POA -Patient presented to [facility] hypothermic, tachypneic, and tachycardic with leukocytosis, elevated lactic acid and concern for ongoing infection P: Transferred to [facility],  ICU Continue IV cefepime, Flagyl , and vancomycin Follow cultures Aggressive IV hydration provided on admission Trend  lactic acid Monitor urine output Management of left empyema as above Borderline hypotensive currently has Port-A-Cath accessed would recommend utilizing this central access as opposed to placement of PICC line given high suspicion for continued bacteremia  Acute kidney injury - Patient presented with creatinine 2.48 with GFR 23 compared to creatinine 1.09 and GFR greater than 60 on 11/4 Hyperkalemia P: Follow renal function  Monitor urine output Trend Bmet Avoid nephrotoxins Ensure adequate renal perfusion  IV hydration Hyperkalemia management with albuterol , glucose/insulin, and sodium bicarb  HFpEF - Echocardiogram May 2025 with EF 60 to 65%, no WMA, grade 1 diastolic dysfunction Essential hypertension P: Hold home antihypertensives Continuous telemetry Optimize electrolytes Daily weight Goal of normovolemia  Malignant ascites -Underwent paracentesis on arrival 11/13 with 600 mL of brown fluid removed P: Evaluate for benefit of paracentesis Pleural fluid sent for cytology  Metastatic cervical cancer P: Management per oncology Multimodal pain control Supportive care  Anemia Thrombocytopenia P: Trend CBC Transfuse per protocol Hemoglobin goal greater than 7 Platelet goal greater than 10   Labs   CBC: Recent Labs  Lab 07/13/24 1602 07/14/24 0441  WBC 23.5* 18.3*  NEUTROABS 18.6*  --   HGB 9.6* 7.8*  HCT 28.2* 23.3*  MCV 81.3 81.8  PLT 150 117*    Basic Metabolic Panel: Recent Labs  Lab 07/13/24 1602 07/13/24 2230 07/14/24 0441  NA 127* 128* 127*  K 6.2* 6.2* 6.0*  CL 87* 88* 88*  CO2 25 27 27   GLUCOSE 93 73 228*  BUN 32* 33* 36*  CREATININE 2.48* 2.52* 2.59*  CALCIUM  8.0* 7.8* 7.2*   GFR: Estimated Creatinine Clearance: 24 mL/min (A) (by C-G formula based on SCr of 2.59 mg/dL (H)). Recent Labs  Lab 07/13/24 1602 07/13/24 1805 07/14/24 0441 07/14/24 0833  WBC 23.5*  --  18.3*  --   LATICACIDVEN 6.2* 4.4*  --  2.5*    Liver  Function Tests: Recent Labs  Lab 07/13/24 1602 07/14/24 0441  AST 183* 162*  ALT 24 23  ALKPHOS 187* 169*  BILITOT 1.2 1.0  PROT 5.9* 5.0*  ALBUMIN 2.4* 2.1*   No results for input(s): LIPASE, AMYLASE in the last 168 hours. No results for input(s): AMMONIA in the last 168 hours.  ABG    Component Value Date/Time   PHART 7.32 (L) 07/14/2024 0840   PCO2ART 56 (H) 07/14/2024 0840   PO2ART 84 07/14/2024 0840   HCO3 28.9 (H) 07/14/2024 0840   O2SAT 98.8 07/14/2024 0840     Coagulation Profile: Recent Labs  Lab 07/13/24 1602  INR 1.8*    Cardiac Enzymes: No results for input(s): CKTOTAL, CKMB, CKMBINDEX, TROPONINI in the last 168 hours.  HbA1C: Hgb A1c MFr Bld  Date/Time Value Ref Range Status  08/29/2022 04:07 AM 5.3 4.8 - 5.6 % Final    Comment:    (NOTE)         Prediabetes: 5.7 - 6.4         Diabetes: >6.4         Glycemic control for adults with diabetes: <7.0   11/25/2016 10:53 AM 5.0 <5.7 % Final    Comment:      For the purpose of screening for the presence of diabetes:   <5.7%       Consistent with the absence of diabetes 5.7-6.4 %   Consistent with increased risk for diabetes (prediabetes) >=6.5 %  Consistent with diabetes   This assay result is consistent with a decreased risk of diabetes.   Currently, no consensus exists regarding use of hemoglobin A1c for diagnosis of diabetes in children.   According to American Diabetes Association (ADA) guidelines, hemoglobin A1c <7.0% represents optimal control in non-pregnant diabetic patients. Different metrics may apply to specific patient populations. Standards of Medical Care in Diabetes (ADA).       CBG: Recent Labs  Lab 07/14/24 0149 07/14/24 0252 07/14/24 0422 07/14/24 0450 07/14/24 0726  GLUCAP 61* 114* 64* 122* 105*    Review of Systems:   Please see the history of present illness. All other systems reviewed and are negative   Past Medical History:  She,  has a  past medical history of Anemia, Asthma, Bronchitis, Cancer (HCC), CHF (congestive heart failure) (HCC), COPD (chronic obstructive pulmonary disease) (HCC), Depression, Dyspnea, GERD (gastroesophageal reflux disease), Headache, Hypertension, and Port-A-Cath in place (09/25/2022).   Surgical History:   Past Surgical History:  Procedure Laterality Date   BREAST SURGERY     CESAREAN SECTION     IR IMAGING GUIDED PORT INSERTION  09/30/2022   LEG SURGERY     VIDEO BRONCHOSCOPY WITH ENDOBRONCHIAL NAVIGATION Bilateral 06/07/2024   Procedure: VIDEO BRONCHOSCOPY WITH ENDOBRONCHIAL NAVIGATION;  Surgeon: Isadora Hose, MD;  Location: [facility] ORS;  Service: Pulmonary;  Laterality: Bilateral;     Social History:   reports that she has been smoking cigarettes. She started smoking about 7 weeks ago. She has a 0.1 pack-year smoking history. She has never used smokeless tobacco. She reports current alcohol  use. She reports current drug use. Drug: Marijuana.   Family History:  Her family history includes Cancer in her father and mother; Dermatomyositis in her mother; Heart disease in her father; Hypertension in her mother; Lung cancer in her maternal uncle and paternal uncle. There is no history of Colon cancer, Breast cancer, Ovarian cancer, Endometrial cancer, Pancreatic cancer, or Prostate cancer.   Allergies Allergies  Allergen Reactions   Carrot [Daucus Carota] Hives   Lisinopril -Hydrochlorothiazide  Swelling    Oral swelling   Erythromycin Hives   Ibuprofen  Swelling    Oral swelling   Other Itching and Other (See Comments)    Hair dye, blisters, pus-filled, soreness   Tramadol  Hives     Home Medications  Prior to Admission medications   Medication Sig Start Date End Date Taking? Authorizing Provider  albuterol  (VENTOLIN  HFA) 108 (90 Base) MCG/ACT inhaler Inhale 2 puffs into the lungs every 6 (six) hours as needed for wheezing or shortness of breath. 05/25/24  Yes Dgayli, Hose, MD  amLODipine   (NORVASC ) 10 MG tablet Take 1 tablet (10 mg total) by mouth daily. 05/30/24  Yes Davonna Siad, MD  budesonide -formoterol  (SYMBICORT ) 160-4.5 MCG/ACT inhaler Inhale 2 puffs into the lungs in the morning and at bedtime. 05/25/24  Yes Dgayli, Hose, MD  fentaNYL  (DURAGESIC ) 12 MCG/HR Place 1 patch onto the skin every 3 (three) days. 07/06/24  Yes [provider]  GAS RELIEF 80 MG chewable tablet Chew 80 mg by mouth every 6 (six) hours as needed for flatulence. 07/11/24  Yes [provider]  magnesium  oxide (MAG-OX) 400 (240 Mg) MG tablet Take 1 tablet (400 mg total) by mouth 3 (three) times daily. 03/24/24  Yes [name], MD  oxyCODONE  (OXY IR/ROXICODONE ) 5 MG immediate release tablet Take 1 tablet (5 mg total) by mouth every 6 (six) hours as needed for severe pain (pain score 7-10). 05/27/24  Yes Burns, Delon BRAVO,  NP  polyethylene glycol powder (GLYCOLAX/MIRALAX) 17 GM/SCOOP powder Take 17 g by mouth daily. 07/06/24  Yes [provider]  pregabalin  (LYRICA ) 200 MG capsule Take 1 capsule (200 mg total) by mouth 3 (three) times daily. 05/27/24  Yes Burns, Delon BRAVO, NP  SENNA-S 8.6-50 MG tablet Take 1 tablet by mouth 2 (two) times daily. 07/13/24  Yes [provider]  sodium phosphate  (FLEET) ENEM Place 1 enema rectally daily. 07/13/24  Yes [provider]  Tiotropium Bromide Monohydrate  (SPIRIVA  RESPIMAT) 2.5 MCG/ACT AERS Inhale 2 puffs into the lungs daily. 05/25/24  Yes Isadora Hose, MD     Critical care time:  CRITICAL CARE Performed by: Adelee Hannula D. Harris   Total critical care time: 45 minutes  Critical care time was exclusive of separately billable procedures and treating other patients.  Critical care was necessary to treat or prevent imminent or life-threatening deterioration.  Critical care was time spent personally by me on the following activities: development of treatment plan with patient and/or surrogate as well as nursing,  discussions with consultants, evaluation of patient's response to treatment, examination of patient, obtaining history from patient or surrogate, ordering and performing treatments and interventions, ordering and review of laboratory studies, ordering and review of radiographic studies, pulse oximetry and re-evaluation of patient's condition.  Sofiah Lyne D. Harris, NP-C [facility] Personal contact information can be found on Amion  If no contact or response made please call [phone] 07/14/2024, 10:17 AM

## 2024-07-14 NOTE — Procedures (Signed)
 Paracentesis Procedure Note  Kristin Carson  [id number]  09-25-1970  Date:07/14/24  Time:6:48 PM   Provider Performing:Moe Graca D. Harris    Procedure: Paracentesis with imaging guidance (50916)  Indication(s) Ascites  Consent Unable to obtain consent due to emergent nature of procedure.  Anesthesia Topical only with 1% lidocaine     Time Out Verified patient identification, verified procedure, site/side was marked, verified correct patient position, special equipment/implants available, medications/allergies/relevant history reviewed, required imaging and test results available.   Sterile Technique Maximal sterile technique including full sterile barrier drape, hand hygiene, sterile gown, sterile gloves, mask, hair covering, sterile ultrasound probe cover (if used).   Procedure Description Ultrasound used to identify appropriate peritoneal anatomy for placement and overlying skin marked.  Area of drainage cleaned and draped in sterile fashion. Lidocaine  was used to anesthetize the skin and subcutaneous tissue.  600 cc's of brown appearing fluid was drained. Catheter then removed and bandaid applied to site.    Complications/Tolerance None; patient tolerated the procedure well.   EBL Minimal   Specimen(s) Peritoneal fluid  Lestine Rahe D. Harris, NP-C [facility] Personal contact information can be found on Amion  If no contact or response made please call [phone] 07/14/2024, 6:49 PM

## 2024-07-14 NOTE — Progress Notes (Signed)
 Date and time results received: 07/14/24 1045 (use smartphrase .now to insert current time)  Test: Potassium Critical Value: 6.6  Name of Provider Notified: Dr. Willette  Orders Received? Or Actions Taken?: MD and primary nurse notified.

## 2024-07-14 NOTE — Procedures (Signed)
 Insertion of Chest Tube Procedure Note  Kristin Carson  [id number]  1971-04-08  Date:07/14/24  Time:5:55 PM    Provider Performing: PAULA SOUTHERLY   Procedure: Pleural Catheter Insertion w/ Imaging Guidance (67442)  Indication(s) Effusion  Consent Unable to obtain consent due to emergent nature of procedure.  Anesthesia Topical only with 1% lidocaine     Time Out Verified patient identification, verified procedure, site/side was marked, verified correct patient position, special equipment/implants available, medications/allergies/relevant history reviewed, required imaging and test results available.   Sterile Technique Maximal sterile technique including full sterile barrier drape, hand hygiene, sterile gown, sterile gloves, mask, hair covering, sterile ultrasound probe cover (if used).   Procedure Description Ultrasound used to identify appropriate pleural anatomy for placement and overlying skin marked. Area of placement cleaned and draped in sterile fashion.  A 14 French pigtail pleural catheter was placed into the left pleural space using Seldinger technique. Appropriate return of pus was obtained.  The tube was connected to atrium and placed on -20 cm H2O wall suction.   Complications/Tolerance None; patient tolerated the procedure well. Chest X-ray is ordered to verify placement.   EBL Minimal  Specimen(s) fluid - sent for usual pleural fluid studies.

## 2024-07-15 ENCOUNTER — Inpatient Hospital Stay (HOSPITAL_COMMUNITY)

## 2024-07-15 DIAGNOSIS — A419 Sepsis, unspecified organism: Secondary | ICD-10-CM | POA: Diagnosis not present

## 2024-07-15 DIAGNOSIS — R0989 Other specified symptoms and signs involving the circulatory and respiratory systems: Secondary | ICD-10-CM | POA: Diagnosis not present

## 2024-07-15 DIAGNOSIS — D649 Anemia, unspecified: Secondary | ICD-10-CM

## 2024-07-15 DIAGNOSIS — J9602 Acute respiratory failure with hypercapnia: Secondary | ICD-10-CM

## 2024-07-15 DIAGNOSIS — J90 Pleural effusion, not elsewhere classified: Secondary | ICD-10-CM | POA: Diagnosis not present

## 2024-07-15 DIAGNOSIS — R6521 Severe sepsis with septic shock: Secondary | ICD-10-CM | POA: Diagnosis not present

## 2024-07-15 DIAGNOSIS — R918 Other nonspecific abnormal finding of lung field: Secondary | ICD-10-CM | POA: Diagnosis not present

## 2024-07-15 DIAGNOSIS — E162 Hypoglycemia, unspecified: Secondary | ICD-10-CM

## 2024-07-15 DIAGNOSIS — I5030 Unspecified diastolic (congestive) heart failure: Secondary | ICD-10-CM

## 2024-07-15 DIAGNOSIS — Z452 Encounter for adjustment and management of vascular access device: Secondary | ICD-10-CM | POA: Diagnosis not present

## 2024-07-15 DIAGNOSIS — N179 Acute kidney failure, unspecified: Secondary | ICD-10-CM | POA: Diagnosis not present

## 2024-07-15 DIAGNOSIS — E875 Hyperkalemia: Secondary | ICD-10-CM | POA: Diagnosis not present

## 2024-07-15 DIAGNOSIS — G9340 Encephalopathy, unspecified: Secondary | ICD-10-CM

## 2024-07-15 DIAGNOSIS — R7401 Elevation of levels of liver transaminase levels: Secondary | ICD-10-CM

## 2024-07-15 DIAGNOSIS — Z4682 Encounter for fitting and adjustment of non-vascular catheter: Secondary | ICD-10-CM | POA: Diagnosis not present

## 2024-07-15 LAB — CBC WITH DIFFERENTIAL/PLATELET
Abs Immature Granulocytes: 0.31 K/uL — ABNORMAL HIGH (ref 0.00–0.07)
Basophils Absolute: 0 K/uL (ref 0.0–0.1)
Basophils Relative: 0 %
Eosinophils Absolute: 0 K/uL (ref 0.0–0.5)
Eosinophils Relative: 0 %
HCT: 20.4 % — ABNORMAL LOW (ref 36.0–46.0)
Hemoglobin: 6.7 g/dL — CL (ref 12.0–15.0)
Immature Granulocytes: 2 %
Lymphocytes Relative: 6 %
Lymphs Abs: 0.9 K/uL (ref 0.7–4.0)
MCH: 27.6 pg (ref 26.0–34.0)
MCHC: 32.8 g/dL (ref 30.0–36.0)
MCV: 84 fL (ref 80.0–100.0)
Monocytes Absolute: 1.2 K/uL — ABNORMAL HIGH (ref 0.1–1.0)
Monocytes Relative: 8 %
Neutro Abs: 12.4 K/uL — ABNORMAL HIGH (ref 1.7–7.7)
Neutrophils Relative %: 84 %
Platelets: 48 K/uL — ABNORMAL LOW (ref 150–400)
RBC: 2.43 MIL/uL — ABNORMAL LOW (ref 3.87–5.11)
RDW: 21.7 % — ABNORMAL HIGH (ref 11.5–15.5)
Smear Review: NORMAL
WBC: 14.9 K/uL — ABNORMAL HIGH (ref 4.0–10.5)
nRBC: 0.1 % (ref 0.0–0.2)

## 2024-07-15 LAB — COMPREHENSIVE METABOLIC PANEL WITH GFR
ALT: 18 U/L (ref 0–44)
AST: 114 U/L — ABNORMAL HIGH (ref 15–41)
Albumin: 2.8 g/dL — ABNORMAL LOW (ref 3.5–5.0)
Alkaline Phosphatase: 118 U/L (ref 38–126)
Anion gap: 10 (ref 5–15)
BUN: 39 mg/dL — ABNORMAL HIGH (ref 6–20)
CO2: 26 mmol/L (ref 22–32)
Calcium: 7.1 mg/dL — ABNORMAL LOW (ref 8.9–10.3)
Chloride: 93 mmol/L — ABNORMAL LOW (ref 98–111)
Creatinine, Ser: 2.86 mg/dL — ABNORMAL HIGH (ref 0.44–1.00)
GFR, Estimated: 19 mL/min — ABNORMAL LOW (ref >60)
Glucose, Bld: 120 mg/dL — ABNORMAL HIGH (ref 70–99)
Potassium: 6 mmol/L — ABNORMAL HIGH (ref 3.5–5.1)
Sodium: 129 mmol/L — ABNORMAL LOW (ref 135–145)
Total Bilirubin: 1.1 mg/dL (ref 0.0–1.2)
Total Protein: 5.4 g/dL — ABNORMAL LOW (ref 6.5–8.1)

## 2024-07-15 LAB — POCT I-STAT EG7
Acid-Base Excess: 0 mmol/L (ref 0.0–2.0)
Bicarbonate: 29 mmol/L — ABNORMAL HIGH (ref 20.0–28.0)
Calcium, Ion: 1.09 mmol/L — ABNORMAL LOW (ref 1.15–1.40)
HCT: 29 % — ABNORMAL LOW (ref 36.0–46.0)
Hemoglobin: 9.9 g/dL — ABNORMAL LOW (ref 12.0–15.0)
O2 Saturation: 68 %
Patient temperature: 36.1
Potassium: 6.2 mmol/L — ABNORMAL HIGH (ref 3.5–5.1)
Sodium: 126 mmol/L — ABNORMAL LOW (ref 135–145)
TCO2: 31 mmol/L (ref 22–32)
pCO2, Ven: 65.9 mmHg — ABNORMAL HIGH (ref 44–60)
pH, Ven: 7.246 — ABNORMAL LOW (ref 7.25–7.43)
pO2, Ven: 40 mmHg (ref 32–45)

## 2024-07-15 LAB — BLOOD CULTURE ID PANEL (REFLEXED) - BCID2
A.calcoaceticus-baumannii: NOT DETECTED
Bacteroides fragilis: NOT DETECTED
CTX-M ESBL: NOT DETECTED
Candida albicans: NOT DETECTED
Candida auris: NOT DETECTED
Candida glabrata: NOT DETECTED
Candida krusei: NOT DETECTED
Candida parapsilosis: NOT DETECTED
Candida tropicalis: NOT DETECTED
Carbapenem resist OXA 48 LIKE: NOT DETECTED
Carbapenem resistance IMP: NOT DETECTED
Carbapenem resistance KPC: NOT DETECTED
Carbapenem resistance NDM: NOT DETECTED
Carbapenem resistance VIM: NOT DETECTED
Cryptococcus neoformans/gattii: NOT DETECTED
Enterobacter cloacae complex: NOT DETECTED
Enterobacterales: DETECTED — AB
Enterococcus Faecium: NOT DETECTED
Enterococcus faecalis: NOT DETECTED
Escherichia coli: DETECTED — AB
Haemophilus influenzae: NOT DETECTED
Klebsiella aerogenes: NOT DETECTED
Klebsiella oxytoca: NOT DETECTED
Klebsiella pneumoniae: NOT DETECTED
Listeria monocytogenes: NOT DETECTED
Neisseria meningitidis: NOT DETECTED
Proteus species: NOT DETECTED
Pseudomonas aeruginosa: NOT DETECTED
Salmonella species: NOT DETECTED
Serratia marcescens: NOT DETECTED
Staphylococcus aureus (BCID): NOT DETECTED
Staphylococcus epidermidis: NOT DETECTED
Staphylococcus lugdunensis: NOT DETECTED
Staphylococcus species: NOT DETECTED
Stenotrophomonas maltophilia: NOT DETECTED
Streptococcus agalactiae: NOT DETECTED
Streptococcus pneumoniae: DETECTED — AB
Streptococcus pyogenes: NOT DETECTED
Streptococcus species: DETECTED — AB

## 2024-07-15 LAB — BODY FLUID CELL COUNT WITH DIFFERENTIAL
Eos, Fluid: 2 %
Lymphs, Fluid: 14 %
Monocyte-Macrophage-Serous Fluid: 2 % — ABNORMAL LOW (ref 50–90)
Neutrophil Count, Fluid: 82 % — ABNORMAL HIGH (ref 0–25)
Total Nucleated Cell Count, Fluid: UNDETERMINED uL (ref 0–1000)

## 2024-07-15 LAB — PROTEIN, PLEURAL OR PERITONEAL FLUID: Total protein, fluid: 3.5 g/dL

## 2024-07-15 LAB — GLUCOSE, CAPILLARY
Glucose-Capillary: 102 mg/dL — ABNORMAL HIGH (ref 70–99)
Glucose-Capillary: 107 mg/dL — ABNORMAL HIGH (ref 70–99)
Glucose-Capillary: 108 mg/dL — ABNORMAL HIGH (ref 70–99)
Glucose-Capillary: 85 mg/dL (ref 70–99)
Glucose-Capillary: 96 mg/dL (ref 70–99)
Glucose-Capillary: 98 mg/dL (ref 70–99)

## 2024-07-15 LAB — BLOOD GAS, ARTERIAL
Acid-base deficit: 1.7 mmol/L (ref 0.0–2.0)
Bicarbonate: 26.8 mmol/L (ref 20.0–28.0)
Drawn by: 23532
Expiratory PAP: 5 cmH2O
FIO2: 40 %
Inspiratory PAP: 15 cmH2O
Mode: POSITIVE
O2 Saturation: 95.2 %
Patient temperature: 36
pCO2 arterial: 58 mmHg — ABNORMAL HIGH (ref 32–48)
pH, Arterial: 7.26 — ABNORMAL LOW (ref 7.35–7.45)
pO2, Arterial: 62 mmHg — ABNORMAL LOW (ref 83–108)

## 2024-07-15 LAB — BLOOD GAS, VENOUS
Acid-base deficit: 2 mmol/L (ref 0.0–2.0)
Bicarbonate: 27.7 mmol/L (ref 20.0–28.0)
O2 Saturation: 85.7 %
Patient temperature: 37
pCO2, Ven: 71 mmHg (ref 44–60)
pH, Ven: 7.2 — ABNORMAL LOW (ref 7.25–7.43)
pO2, Ven: 55 mmHg — ABNORMAL HIGH (ref 32–45)

## 2024-07-15 LAB — GLUCOSE, PLEURAL OR PERITONEAL FLUID: Glucose, Fluid: <20 mg/dL

## 2024-07-15 LAB — LACTATE DEHYDROGENASE, PLEURAL OR PERITONEAL FLUID: LD, Fluid: >2500 U/L — ABNORMAL HIGH (ref 3–23)

## 2024-07-15 LAB — ALBUMIN, PLEURAL OR PERITONEAL FLUID: Albumin, Fluid: <1.5 g/dL

## 2024-07-15 LAB — AMMONIA: Ammonia: 27 umol/L (ref 9–35)

## 2024-07-15 MED ORDER — SODIUM BICARBONATE 8.4 % IV SOLN: 50.0000 meq | Freq: Once | INTRAVENOUS | Status: DC

## 2024-07-15 MED ORDER — HYDROMORPHONE HCL-NACL 50-0.9 MG/50ML-% IV SOLN
0.0000 mg/h | INTRAVENOUS | Status: DC
  Administered 2024-07-15: 1 mg/h via INTRAVENOUS
  Filled 2024-07-15: qty 50

## 2024-07-15 MED ORDER — GLYCOPYRROLATE 0.2 MG/ML IJ SOLN
0.2000 mg | INTRAMUSCULAR | Status: DC | PRN
Start: 2024-07-15 — End: 2024-07-15

## 2024-07-15 MED ORDER — GLYCOPYRROLATE 0.2 MG/ML IJ SOLN: 0.2000 mg | INTRAMUSCULAR | Status: DC | PRN

## 2024-07-15 MED ORDER — SODIUM ZIRCONIUM CYCLOSILICATE 10 G PO PACK: 10.0000 g | PACK | Freq: Once | ORAL | Status: DC

## 2024-07-15 MED ORDER — ACETAMINOPHEN 650 MG RE SUPP: 650.0000 mg | Freq: Four times a day (QID) | RECTAL | Status: DC | PRN

## 2024-07-15 MED ORDER — DEXTROSE 50 % IV SOLN: 1.0000 | Freq: Once | INTRAVENOUS | Status: DC

## 2024-07-15 MED ORDER — SODIUM CHLORIDE 0.9 % IV SOLN: INTRAVENOUS | Status: DC

## 2024-07-15 MED ORDER — HYDROMORPHONE BOLUS VIA INFUSION
1.0000 mg | INTRAVENOUS | Status: DC | PRN
  Administered 2024-07-15: 1 mg via INTRAVENOUS

## 2024-07-15 MED ORDER — POLYVINYL ALCOHOL 1.4 % OP SOLN: 1.0000 [drp] | Freq: Four times a day (QID) | OPHTHALMIC | Status: DC | PRN

## 2024-07-15 MED ORDER — ACETAMINOPHEN 325 MG PO TABS: 650.0000 mg | ORAL_TABLET | Freq: Four times a day (QID) | ORAL | Status: DC | PRN

## 2024-07-15 MED ORDER — INSULIN ASPART 100 UNIT/ML IV SOLN: 5.0000 [IU] | Freq: Once | INTRAVENOUS | Status: DC

## 2024-07-15 MED ORDER — GLYCOPYRROLATE 1 MG PO TABS: 1.0000 mg | ORAL_TABLET | ORAL | Status: DC | PRN

## 2024-07-15 MED ORDER — MIDAZOLAM HCL (PF) 2 MG/2ML IJ SOLN: 2.0000 mg | INTRAMUSCULAR | Status: DC | PRN

## 2024-07-17 LAB — BODY FLUID CULTURE W GRAM STAIN

## 2024-07-17 LAB — CULTURE, BLOOD (ROUTINE X 2): Special Requests: ADEQUATE

## 2024-07-18 LAB — BODY FLUID CULTURE W GRAM STAIN: Culture: NO GROWTH

## 2024-07-19 LAB — CYTOLOGY - NON PAP

## 2024-07-20 LAB — CULTURE, BLOOD (ROUTINE X 2)
Culture: NO GROWTH
Culture: NO GROWTH

## 2024-07-21 LAB — ACID FAST CULTURE WITH REFLEXED SENSITIVITIES (MYCOBACTERIA): Acid Fast Culture: NEGATIVE

## 2024-07-25 ENCOUNTER — Inpatient Hospital Stay: Admitting: Oncology

## 2024-07-25 ENCOUNTER — Inpatient Hospital Stay

## 2024-08-01 NOTE — Progress Notes (Signed)
 NAME:  Kristin Carson, MRN:  [id number], DOB:  Dec 07, 1970, LOS: 2 ADMISSION DATE:  07/13/2024, CONSULTATION DATE:  07/14/2024 REFERRING MD:  TRH, CHIEF COMPLAINT:  Left pleural effusion    History of Present Illness:  Kristin Carson is a 53 y.o. female with a PMH significant for metastatic cervical cancer with mets to the lung, liver and peritoneum, COPD, HTN, and depression who presented to the ED at [facility] for complaints of weakness which resulted in a mechanical fall day of admission. On EMS arrval she was seen hypotensive and hypoglycemic. Workup on admission concerning for SIRS response with acute hypoxic respiratory failure resulting on admission to [facility].   AM 11/13 decision was made to transfer patient to [facility] for ongoing care and need for pleural intervention for management of loculated left pleural effusion.   Pertinent  Medical History  Metastatic cervical cancer with mets to the lung, liver and peritoneum, COPD, HTN, and depression  Significant Hospital Events: Including procedures, antibiotic start and stop dates in addition to other pertinent events   11/12 presented with weakness admitted for acute hypoxic respiratory failure with signs of sepsis, POA  11/13 presented from [facility] minimally responsive with mild hypotension and tachypnea  Interim History / Subjective:  Cortrak placed overnight   AM labs pending   Objective    Blood pressure (!) 92/50, pulse 97, temperature (!) 97.3 F (36.3 C), resp. rate 15, height 5' 1 (1.549 m), weight 74.5 kg, last menstrual period 08/15/2022, SpO2 100%.    FiO2 (%):  [40 %] 40 % PEEP:  [5 cmH20] 5 cmH20   Intake/Output Summary (Last 24 hours) at 2024-07-23 1000 Last data filed at Jul 23, 2024 0654 Gross per 24 hour  Intake 2815.16 ml  Output 860 ml  Net 1955.16 ml   Filed Weights   07/13/24 1505 07/13/24 2035 07/14/24 1800  Weight: 78.3 kg 76.4 kg 74.5 kg    Examination: General: Critically and chronically ill middle aged  F  HEENT: NCAT cortrak in place  Neuro: Obtunded. Pupils are equal and round  CV: rr s1s2  PULM: L chest tube with pus. Symmetrical chest expansion. Incr RR  GI: round soft  Extremities: no obvious acute joint deformity  Skin: clean dry    Resolved problem list   Assessment and Plan    Metastatic cervical cancer  Malignant ascites  -mets lung liver peritoneum Acute encephalopathy  Acute ischemic infarcts Acute hypoxic and hypercarbic respiratory failure  L empyema  Septic shock GPC GNR bacteremia -recent strep pneumo bacteremia GPC empyema AKI with anuria  Hyperkalemia  Hypocalcemia Hyponatremia  Elevated LFTs  HFpEF  Mitral  Hx HTN  Hypoglycemia  Anemia Thrombocytopenia  DNR/I GOC Encounter for palliative care  -initial temporizing measures had been ordered re her metabolic abnormalities Jul 23, 2024.   -overnight events and progressive decline noted P -I spoke with pt niece and sister on the phone. We talked about her ongoing decline and the information we currently have re her septic shock with MSOF.  -we talked about what ongoing aggressive care would entail, and I shared that I unfortunately do not think this will help achieve goal of getting home for home hospice. Family does not want the pt to suffer. A decision was reached to transition to comfort care. We talked about what this entails and all questions were answered. -we will start comfort meds, and stop non-comfort meds except for pressors while we await family arrival  -DNR   Labs   CBC:  Recent Labs  Lab 07/13/24 1602 07/14/24 0441 07/14/24 1946 Aug 05, 2024 0733  WBC 23.5* 18.3* 20.0* 14.9*  NEUTROABS 18.6*  --   --  12.4*  HGB 9.6* 7.8* 7.8* 6.7*  HCT 28.2* 23.3* 23.8* 20.4*  MCV 81.3 81.8 83.2 84.0  PLT 150 117* 83* 48*    Basic Metabolic Panel: Recent Labs  Lab 07/13/24 2230 07/14/24 0441 07/14/24 1002 07/14/24 1426 07/14/24 1850 08-05-24 0733  NA 128* 127*  --  130* 129* 129*  K 6.2*  6.0* 6.6* 6.1* 5.7* 6.0*  CL 88* 88*  --  91* 91* 93*  CO2 27 27  --  29 28 26   GLUCOSE 73 228*  --  41* 81 120*  BUN 33* 36*  --  38* 40* 39*  CREATININE 2.52* 2.59*  --  2.68* 2.95* 2.86*  CALCIUM  7.8* 7.2*  --  7.3* 7.5* 7.1*   GFR: Estimated Creatinine Clearance: 21.5 mL/min (A) (by C-G formula based on SCr of 2.86 mg/dL (H)). Recent Labs  Lab 07/13/24 1602 07/13/24 1805 07/14/24 0441 07/14/24 0833 07/14/24 1002 07/14/24 1854 07/14/24 1946 05-Aug-2024 0733  WBC 23.5*  --  18.3*  --   --   --  20.0* 14.9*  LATICACIDVEN 6.2* 4.4*  --  2.5* 2.0* 2.6*  --   --     Liver Function Tests: Recent Labs  Lab 07/13/24 1602 07/14/24 0441 07/14/24 1426 07/14/24 1850 08/05/24 0733  AST 183* 162* 155* 147* 114*  ALT 24 23 24 22 18   ALKPHOS 187* 169* 157* 143* 118  BILITOT 1.2 1.0 0.9 0.9 1.1  PROT 5.9* 5.0* 5.3* 5.0* 5.4*  ALBUMIN 2.4* 2.1* 2.3* 2.0* 2.8*   No results for input(s): LIPASE, AMYLASE in the last 168 hours. Recent Labs  Lab 2024/08/05 0037  AMMONIA 27    ABG    Component Value Date/Time   PHART 7.26 (L) 2024/08/05 0525   PCO2ART 58 (H) August 05, 2024 0525   PO2ART 62 (L) 05-Aug-2024 0525   HCO3 26.8 August 05, 2024 0525   ACIDBASEDEF 1.7 August 05, 2024 0525   O2SAT 95.2 08/05/2024 0525     Coagulation Profile: Recent Labs  Lab 07/13/24 1602  INR 1.8*    Cardiac Enzymes: No results for input(s): CKTOTAL, CKMB, CKMBINDEX, TROPONINI in the last 168 hours.  HbA1C: Hgb A1c MFr Bld  Date/Time Value Ref Range Status  08/29/2022 04:07 AM 5.3 4.8 - 5.6 % Final    Comment:    (NOTE)         Prediabetes: 5.7 - 6.4         Diabetes: >6.4         Glycemic control for adults with diabetes: <7.0   11/25/2016 10:53 AM 5.0 <5.7 % Final    Comment:      For the purpose of screening for the presence of diabetes:   <5.7%       Consistent with the absence of diabetes 5.7-6.4 %   Consistent with increased risk for diabetes (prediabetes) >=6.5 %     Consistent  with diabetes   This assay result is consistent with a decreased risk of diabetes.   Currently, no consensus exists regarding use of hemoglobin A1c for diagnosis of diabetes in children.   According to American Diabetes Association (ADA) guidelines, hemoglobin A1c <7.0% represents optimal control in non-pregnant diabetic patients. Different metrics may apply to specific patient populations. Standards of Medical Care in Diabetes (ADA).       CBG: Recent Labs  Lab 05-Aug-2024 0016  08-10-2024 0230 08-10-2024 0354 08-10-24 0551 2024-08-10 0755  GLUCAP 96 98 108* 107* 102*    CRITICAL CARE Performed by: Ronnald FORBES Gave   Total critical care time: 50 minutes  Critical care time was exclusive of separately billable procedures and treating other patients. Critical care was necessary to treat or prevent imminent or life-threatening deterioration.  Critical care was time spent personally by me on the following activities: development of treatment plan with patient and/or surrogate as well as nursing, discussions with consultants, evaluation of patient's response to treatment, examination of patient, obtaining history from patient or surrogate, ordering and performing treatments and interventions, ordering and review of laboratory studies, ordering and review of radiographic studies, pulse oximetry and re-evaluation of patient's condition.   Ronnald Gave MSN, AGACNP-BC [facility] Amion for pager  08-10-2024, 10:00 AM

## 2024-08-01 NOTE — IPAL (Signed)
 Late entry   Interdisciplinary Goals of Care Family Meeting   Date carried out: 07/26/2024  Location of the meeting: Unit  Member's involved: Nurse Practitioner, Bedside Registered Nurse, and Family Member or next of kin  Durable Power of Attorney or acting medical decision maker: Shared among family with primary decision makers being [name] and Kristin Carson.   Discussion: I was able to meet with Kristin Carson at bedside shortly after admission to ICU from [facility] we had a long discussion regarding Kristin Carson's recurrent and severe septic shock with multisystem organ failure in the setting of widespread metastatic disease.  Kristin Carson reports that Kristin Carson recently engaged hospice care after leaving last admission and indicated that she would like to focus more on a comfort based approach but when she became sicker she sought additional care.  At that time Kristin Carson was able to get Kristin Carson on the phone as well as multiple other family members to discuss goals of care.  Collectively the decision was made to make Kristin Carson a DO NOT RESUSCITATE but continue all aggressive interventions and attempt to improve her mentation and stabilize her to be able to get her home as Kristin Carson has indicated she would like to pass away with hospice at her home.  Family was informed I am very concerned that this may not be a viable possibility as Kristin Carson is very unstable.  Everyone verbally understood.  Code status:   Code Status: Limited: Do not attempt resuscitation (DNR) -DNR-LIMITED -Do Not Intubate/DNI    Disposition: Continue current acute care  Time spent for the meeting: 35 min   Kristin Antonelli D. Harris, NP-C [facility] Personal contact information can be found on Amion  If no contact or response made please call [phone] Jul 26, 2024, 6:39 AM

## 2024-08-01 NOTE — Progress Notes (Signed)
 CORTRAK TUBE INSERTION   A 10 F Cortrak tube has been placed. The tube was secured at the left nare at 80 cm by a member of the Cortrak tube team.  A X-ray is required, abdominal x-ray has been ordered.  KUB results: The tube tip is over the right upper quadrant, likely in the distal stomach or proximal duodenum.  Please confirm tube placement with ordering provider before using the Cortrak tube.   Keep stylet at head of bed. If the tube becomes dislodged please keep the tube and contact ICU Charge Nurse / Rapid Response Nurse for replacement. If replacement cannot be delayed, place a NG tube and confirm placement with an abdominal x-ray.   Alan LITTIE Portugal, RN ICU CHARGE / RAPID RESPONSE 07-21-24 3:24 AM

## 2024-08-01 NOTE — Progress Notes (Signed)
   28-Jul-2024 1404  Spiritual Encounters  Type of Visit Initial  Care provided to: Pt and family  Referral source Clinical staff  Reason for visit End-of-life  OnCall Visit No  Spiritual Framework  Presenting Themes Impactful experiences and emotions  Community/Connection Family  Patient Stress Factors Major life changes  Family Stress Factors Exhausted;Major life changes;Loss  Interventions  Spiritual Care Interventions Made Established relationship of care and support;Compassionate presence;Normalization of emotions;Prayer  Intervention Outcomes  Outcomes Reduced anxiety;Awareness of support;Awareness of health   Chaplain respond to the family's request for prayer. Chaplain offered a word of prayer for the family as the Pt was transitioning, providing comfort and support during this difficult moment.   Chaplain provided spiritual and emotional support for the family and love ones. Chaplain remain present with the family and reminded them that spiritual care services remain available.

## 2024-08-01 NOTE — Progress Notes (Signed)
 eLink Physician-Brief Progress Note Patient Name: Kristin Carson DOB: 17-Apr-1971 MRN: [id number]   Date of Service  08-09-24  HPI/Events of Note  KUB reviewed.  eICU Interventions  Okay to use Cortrack.        Pablo Stauffer U Dorinda Stehr 09-Aug-2024, 4:44 AM

## 2024-08-01 NOTE — Death Summary Note (Signed)
 DEATH SUMMARY   Patient Details  Name: Kristin Carson MRN: [id number] DOB: [birth year]  Admission/Discharge Information   Admit Date:  07/17/2024  Date of Death: Date of Death: 07/19/2024  Time of Death: Time of Death: July 22, 1351  Length of Stay: 2  Referring Physician: [name], FNP   Reason(s) for Hospitalization  Septic Shock due to Strep pneumonia bacteremia and empyema  Diagnoses  Preliminary cause of death:  Secondary Diagnoses (including complications and co-morbidities):  Principal Problem:   Severe sepsis (HCC) Active Problems:   Essential hypertension   AKI (acute kidney injury)   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 year old female  with a PMH significant for metastatic cervical cancer (lungs, liver, peritoneum), COPD, HTN, and recent hx of step pneumonia bacteremia and parapneumonic effusion (left AMA without complete treatment) who presents for lethargy and altered mental status likely due to severe sepsis due to left empyema and strep pneumonia bacteremia requiring ICU level care for hemodynamic and respiratory monitoring.   07/17/24 presented with weakness admitted for acute hypoxic respiratory failure with signs of sepsis, POA  11/13 presented from [facility] minimally responsive with mild hypotension and tachypnea --> chest tube placed and purulent discharge. Paracentesis done. Blood culture grew strep pneumonia. Suspect pleural fluid is strep pneumonia as well.  Patient developing AKI and hyperkalemia. Discussed GOC with family and transitioned to comfort measures only. See progress note on 2024/07/19 for details.  Cause of Death: Strep pneumonia bacteremia and empyema Date of Death: 19-Jul-2024 Time of Death: 07-22-1351  Pertinent Labs and Studies  Significant Diagnostic Studies Reviewed in the chart.  Microbiology Recent Results (from the past 240 hours)  Culture, blood (Routine x 2)     Status: Abnormal (Preliminary result)   Collection Time: Jul 17, 2024  3:37 PM   Specimen: BLOOD  Result Value Ref Range Status   Specimen Description   Final    BLOOD BLOOD LEFT HAND Performed at [facility], [address], [place], [place] [address]    Special Requests   Final    BOTTLES DRAWN AEROBIC AND ANAEROBIC Blood Culture adequate volume Performed at [facility], [address] [place], [place] [address]    Culture  Setup Time   Final    IN BOTH AEROBIC AND ANAEROBIC BOTTLES GRAM POSITIVE COCCI GRAM NEGATIVE RODS Gram Stain Report Called to,Read Back By and Verified With: O PUCKETT AT 0827 ON 11.13.25 BY ADGER J  NOTIFIED PHARMACIST LORI POOLE OF CORRECTION GS AT 0827 AM ON [id number] BY THOMPSON S. CRITICAL RESULT CALLED TO, READ BACK BY AND VERIFIED WITH: PHARMD MARY SWAYNE [id number] AT 0957 BY EC    Culture (A)  Final    ESCHERICHIA COLI STREPTOCOCCUS PNEUMONIAE SUSCEPTIBILITIES TO FOLLOW Performed at [facility], [address], [place], [place] [address]    Report Status PENDING  Incomplete  Blood Culture ID Panel (Reflexed)     Status: Abnormal   Collection Time: 07-17-24  3:37  PM  Result Value Ref Range Status   Enterococcus faecalis NOT DETECTED NOT DETECTED Final   Enterococcus Faecium NOT DETECTED NOT DETECTED Final   Listeria monocytogenes NOT DETECTED NOT DETECTED Final   Staphylococcus species NOT DETECTED NOT DETECTED Final   Staphylococcus aureus (BCID) NOT DETECTED NOT DETECTED Final   Staphylococcus epidermidis NOT DETECTED  NOT DETECTED Final   Staphylococcus lugdunensis NOT DETECTED NOT DETECTED Final   Streptococcus species DETECTED (A) NOT DETECTED Final    Comment: CRITICAL RESULT CALLED TO, READ BACK BY AND VERIFIED WITH: PHARMD MARY SWAYNE [id number] AT 0957 BY EC    Streptococcus agalactiae NOT DETECTED NOT DETECTED Final   Streptococcus pneumoniae DETECTED (A) NOT DETECTED Final    Comment: CRITICAL RESULT CALLED TO, READ BACK BY AND VERIFIED WITH: PHARMD MARY SWAYNE [id number] AT 0957 BY EC    Streptococcus pyogenes NOT DETECTED NOT DETECTED Final   A.calcoaceticus-baumannii NOT DETECTED NOT DETECTED Final   Bacteroides fragilis NOT DETECTED NOT DETECTED Final   Enterobacterales DETECTED (A) NOT DETECTED Final    Comment: Enterobacterales represent a large order of gram negative bacteria, not a single organism. CRITICAL RESULT CALLED TO, READ BACK BY AND VERIFIED WITH: PHARMD MARY SWAYNE [id number] AT 0957 BY EC    Enterobacter cloacae complex NOT DETECTED NOT DETECTED Final   Escherichia coli DETECTED (A) NOT DETECTED Final    Comment: CRITICAL RESULT CALLED TO, READ BACK BY AND VERIFIED WITH: PHARMD MARY SWAYNE [id number] AT 0957 BY EC    Klebsiella aerogenes NOT DETECTED NOT DETECTED Final   Klebsiella oxytoca NOT DETECTED NOT DETECTED Final   Klebsiella pneumoniae NOT DETECTED NOT DETECTED Final   Proteus species NOT DETECTED NOT DETECTED Final   Salmonella species NOT DETECTED NOT DETECTED Final   Serratia marcescens NOT DETECTED NOT DETECTED Final   Haemophilus influenzae NOT DETECTED NOT DETECTED Final   Neisseria meningitidis NOT DETECTED NOT DETECTED Final   Pseudomonas aeruginosa NOT DETECTED NOT DETECTED Final   Stenotrophomonas maltophilia NOT DETECTED NOT DETECTED Final   Candida albicans NOT DETECTED NOT DETECTED Final   Candida auris NOT DETECTED NOT DETECTED Final   Candida glabrata NOT DETECTED NOT DETECTED Final   Candida krusei NOT DETECTED NOT DETECTED Final   Candida  parapsilosis NOT DETECTED NOT DETECTED Final   Candida tropicalis NOT DETECTED NOT DETECTED Final   Cryptococcus neoformans/gattii NOT DETECTED NOT DETECTED Final   CTX-M ESBL NOT DETECTED NOT DETECTED Final   Carbapenem resistance IMP NOT DETECTED NOT DETECTED Final   Carbapenem resistance KPC NOT DETECTED NOT DETECTED Final   Carbapenem resistance NDM NOT DETECTED NOT DETECTED Final   Carbapenem resist OXA 48 LIKE NOT DETECTED NOT DETECTED Final   Carbapenem resistance VIM NOT DETECTED NOT DETECTED Final    Comment: Performed at [facility], [address], [place], [place] [address]  Culture, blood (Routine x 2)     Status: Abnormal (Preliminary result)   Collection Time: 07/13/24  4:02 PM   Specimen: BLOOD  Result Value Ref Range Status   Specimen Description   Final    BLOOD PORTA CATH Performed at [facility], [address], [place], [place] [address]    Special Requests   Final    BOTTLES DRAWN AEROBIC AND ANAEROBIC Blood Culture results may not be optimal due to an inadequate volume of blood received in culture bottles Performed at [facility], [address] [place], [place] [address]    Culture  Setup Time   Final  GRAM POSITIVE COCCI IN BOTH AEROBIC AND ANAEROBIC BOTTLES Gram Stain Report Called to,Read Back By and Verified With: O. PUCKETT [id number] GRAM STAIN REVIEWED-AGREE WITH RESULT DRT Performed at [facility], [address] [place], [place] [address]    Culture STREPTOCOCCUS PNEUMONIAE (A)  Final   Report Status PENDING  Incomplete  Resp panel by RT-PCR (RSV, Flu A&B, Covid) Anterior Nasal Swab     Status: None   Collection Time: 07/13/24  7:27 PM   Specimen: Anterior Nasal Swab  Result Value Ref Range Status   SARS Coronavirus 2 by RT PCR NEGATIVE NEGATIVE Final    Comment: (NOTE) SARS-CoV-2 target nucleic acids are NOT DETECTED.  The SARS-CoV-2 RNA is generally detectable in upper respiratory specimens during the acute phase of infection. The  lowest concentration of SARS-CoV-2 viral copies this assay can detect is 138 copies/mL. A negative result does not preclude SARS-Cov-2 infection and should not be used as the sole basis for treatment or other patient management decisions. A negative result may occur with  improper specimen collection/handling, submission of specimen other than nasopharyngeal swab, presence of viral mutation(s) within the areas targeted by this assay, and inadequate number of viral copies(<138 copies/mL). A negative result must be combined with clinical observations, patient history, and epidemiological information. The expected result is Negative.  Fact Sheet for Patients:  bloggercourse.com  Fact Sheet for Healthcare Providers:  seriousbroker.it  This test is no t yet approved or cleared by the [place]  FDA and  has been authorized for detection and/or diagnosis of SARS-CoV-2 by FDA under an Emergency Use Authorization (EUA). This EUA will remain  in effect (meaning this test can be used) for the duration of the COVID-19 declaration under Section 564(b)(1) of the Act, 21 U.S.C.section 360bbb-3(b)(1), unless the authorization is terminated  or revoked sooner.       Influenza A by PCR NEGATIVE NEGATIVE Final   Influenza B by PCR NEGATIVE NEGATIVE Final    Comment: (NOTE) The Xpert Xpress SARS-CoV-2/FLU/RSV plus assay is intended as an aid in the diagnosis of influenza from Nasopharyngeal swab specimens and should not be used as a sole basis for treatment. Nasal washings and aspirates are unacceptable for Xpert Xpress SARS-CoV-2/FLU/RSV testing.  Fact Sheet for Patients: bloggercourse.com  Fact Sheet for Healthcare Providers: seriousbroker.it  This test is not yet approved or cleared by the [place]  FDA and has been authorized for detection and/or diagnosis of SARS-CoV-2 by FDA under  an Emergency Use Authorization (EUA). This EUA will remain in effect (meaning this test can be used) for the duration of the COVID-19 declaration under Section 564(b)(1) of the Act, 21 U.S.C. section 360bbb-3(b)(1), unless the authorization is terminated or revoked.     Resp Syncytial Virus by PCR NEGATIVE NEGATIVE Final    Comment: (NOTE) Fact Sheet for Patients: bloggercourse.com  Fact Sheet for Healthcare Providers: seriousbroker.it  This test is not yet approved or cleared by the [place]  FDA and has been authorized for detection and/or diagnosis of SARS-CoV-2 by FDA under an Emergency Use Authorization (EUA). This EUA will remain in effect (meaning this test can be used) for the duration of the COVID-19 declaration under Section 564(b)(1) of the Act, 21 U.S.C. section 360bbb-3(b)(1), unless the authorization is terminated or revoked.  Performed at [facility], [address], [place], [place] [address]   MRSA Next Gen by PCR, Nasal     Status: None   Collection Time: 07/13/24  9:15 PM   Specimen: Nasal  Mucosa; Nasal Swab  Result Value Ref Range Status   MRSA by PCR Next Gen NOT DETECTED NOT DETECTED Final    Comment: (NOTE) The GeneXpert MRSA Assay (FDA approved for NASAL specimens only), is one component of a comprehensive MRSA colonization surveillance program. It is not intended to diagnose MRSA infection nor to guide or monitor treatment for MRSA infections. Test performance is not FDA approved in patients less than 20 years old. Performed at [facility], [address] [place], [place] [address]   Body fluid culture w Gram Stain     Status: None (Preliminary result)   Collection Time: 07/14/24  6:25 PM   Specimen: Pleural Fluid  Result Value Ref Range Status   Specimen Description   Final    PLEURAL Performed at [facility], [address], [place], [place] [address]    Special Requests    Final    NONE Performed at [facility], [address], [place], [place] [address]    Gram Stain   Final    RARE WBC PRESENT, PREDOMINANTLY PMN RARE GRAM POSITIVE COCCI Performed at [facility], [address] [place], [place] [address]    Culture PENDING  Incomplete   Report Status PENDING  Incomplete  Body fluid culture w Gram Stain     Status: None (Preliminary result)   Collection Time: 07/14/24  6:25 PM   Specimen: Peritoneal Washings; Peritoneal Fluid  Result Value Ref Range Status   Specimen Description   Final    PERITONEAL Performed at [facility], [address], [place], [place] [address]    Special Requests   Final    NONE Performed at [facility], [address], [place], [place] [address]    Gram Stain   Final    RARE WBC PRESENT, PREDOMINANTLY PMN NO ORGANISMS SEEN Performed at [facility], [address] [place], [place] [address]    Culture PENDING  Incomplete   Report Status PENDING  Incomplete    Lab Basic Metabolic Panel: Recent Labs  Lab 07/13/24 2230 07/14/24 0441 07/14/24 1002 07/14/24 1426 07/14/24 1737 07/14/24 1850 2024/07/18 0733  NA 128* 127*  --  130* 126* 129* 129*  K 6.2* 6.0* 6.6* 6.1* 6.2* 5.7* 6.0*  CL 88* 88*  --  91*  --  91* 93*  CO2 27 27  --  29  --  28 26  GLUCOSE 73 228*  --  41*  --  81 120*  BUN 33* 36*  --  38*  --  40* 39*  CREATININE 2.52* 2.59*  --  2.68*  --  2.95* 2.86*  CALCIUM  7.8* 7.2*  --  7.3*  --  7.5* 7.1*   Liver Function Tests: Recent Labs  Lab 07/13/24 1602 07/14/24 0441 07/14/24 1426 07/14/24 1850 07/18/24 0733  AST 183* 162* 155* 147* 114*  ALT 24 23 24 22 18   ALKPHOS 187* 169* 157* 143* 118  BILITOT 1.2 1.0 0.9 0.9 1.1  PROT 5.9* 5.0* 5.3* 5.0* 5.4*  ALBUMIN 2.4* 2.1* 2.3* 2.0* 2.8*   No results for input(s): LIPASE, AMYLASE in the last 168 hours. Recent Labs  Lab Jul 18, 2024 0037  AMMONIA 27   CBC: Recent  Labs  Lab 07/13/24 1602 07/14/24 0441 07/14/24 1737 07/14/24 1946 07-18-24 0733  WBC 23.5* 18.3*  --  20.0* 14.9*  NEUTROABS 18.6*  --   --   --  12.4*  HGB 9.6* 7.8*  9.9* 7.8* 6.7*  HCT 28.2* 23.3* 29.0* 23.8* 20.4*  MCV 81.3 81.8  --  83.2 84.0  PLT 150 117*  --  83* 48*   Cardiac Enzymes: No results for input(s): CKTOTAL, CKMB, CKMBINDEX, TROPONINI in the last 168 hours. Sepsis Labs: Recent Labs  Lab 07/13/24 1602 07/13/24 1805 07/14/24 0441 07/14/24 0833 07/14/24 1002 07/14/24 1854 07/14/24 1946 Jul 17, 2024 0733  WBC 23.5*  --  18.3*  --   --   --  20.0* 14.9*  LATICACIDVEN 6.2* 4.4*  --  2.5* 2.0* 2.6*  --   --     Procedures/Operations  N/A   PAULA SOUTHERLY 2024/07/17, 2:38 PM

## 2024-08-01 NOTE — Progress Notes (Signed)
 PHARMACY - PHYSICIAN COMMUNICATION CRITICAL VALUE ALERT - BLOOD CULTURE IDENTIFICATION (BCID)  Kristin Carson is an 53 y.o. female who presented to [facility] on 07/13/2024 with a chief complaint of weakness.  Assessment:  BCID + 1/4 GPC (Strep pneumo) and 1/4 GNR (E.coli)   Name of physician (or Provider) Contacted: N/A  Current antibiotics: none, patient has transitioned to comfort care.  Changes to prescribed antibiotics recommended:  Patient on comfort care, no changes needed.  Results for orders placed or performed during the hospital encounter of 07/13/24  Blood Culture ID Panel (Reflexed) (Collected: 07/13/2024  3:37 PM)  Result Value Ref Range   Enterococcus faecalis NOT DETECTED NOT DETECTED   Enterococcus Faecium NOT DETECTED NOT DETECTED   Listeria monocytogenes NOT DETECTED NOT DETECTED   Staphylococcus species NOT DETECTED NOT DETECTED   Staphylococcus aureus (BCID) NOT DETECTED NOT DETECTED   Staphylococcus epidermidis NOT DETECTED NOT DETECTED   Staphylococcus lugdunensis NOT DETECTED NOT DETECTED   Streptococcus species DETECTED (A) NOT DETECTED   Streptococcus agalactiae NOT DETECTED NOT DETECTED   Streptococcus pneumoniae DETECTED (A) NOT DETECTED   Streptococcus pyogenes NOT DETECTED NOT DETECTED   A.calcoaceticus-baumannii NOT DETECTED NOT DETECTED   Bacteroides fragilis NOT DETECTED NOT DETECTED   Enterobacterales DETECTED (A) NOT DETECTED   Enterobacter cloacae complex NOT DETECTED NOT DETECTED   Escherichia coli DETECTED (A) NOT DETECTED   Klebsiella aerogenes NOT DETECTED NOT DETECTED   Klebsiella oxytoca NOT DETECTED NOT DETECTED   Klebsiella pneumoniae NOT DETECTED NOT DETECTED   Proteus species NOT DETECTED NOT DETECTED   Salmonella species NOT DETECTED NOT DETECTED   Serratia marcescens NOT DETECTED NOT DETECTED   Haemophilus influenzae NOT DETECTED NOT DETECTED   Neisseria meningitidis NOT DETECTED NOT DETECTED   Pseudomonas aeruginosa NOT DETECTED  NOT DETECTED   Stenotrophomonas maltophilia NOT DETECTED NOT DETECTED   Candida albicans NOT DETECTED NOT DETECTED   Candida auris NOT DETECTED NOT DETECTED   Candida glabrata NOT DETECTED NOT DETECTED   Candida krusei NOT DETECTED NOT DETECTED   Candida parapsilosis NOT DETECTED NOT DETECTED   Candida tropicalis NOT DETECTED NOT DETECTED   Cryptococcus neoformans/gattii NOT DETECTED NOT DETECTED   CTX-M ESBL NOT DETECTED NOT DETECTED   Carbapenem resistance IMP NOT DETECTED NOT DETECTED   Carbapenem resistance KPC NOT DETECTED NOT DETECTED   Carbapenem resistance NDM NOT DETECTED NOT DETECTED   Carbapenem resist OXA 48 LIKE NOT DETECTED NOT DETECTED   Carbapenem resistance VIM NOT DETECTED NOT DETECTED    Eleanor Agent, PharmD, BCPS 07/31/24  10:47 AM

## 2024-08-01 NOTE — Progress Notes (Signed)
 eLink Physician-Brief Progress Note Patient Name: Kristin Carson DOB: [birth year] MRN: [id number]   Date of Service  07/25/24  HPI/Events of Note  Hypercapnic respiratory failure.  eICU Interventions  Trial of BIPAP.        Gerhardt Gleed U Surya Schroeter 25-Jul-2024, 4:19 AM

## 2024-08-01 NOTE — Progress Notes (Signed)
 eLink Physician-Brief Progress Note Patient Name: Kristin Carson DOB: March 28, 1971 MRN: [id number]   Date of Service  Jul 27, 2024  HPI/Events of Note  CT brain with lesions suspicious for CVA vs metastasis.  eICU Interventions  MRI brain ordered.        [name] [name] 07/27/24, 12:39 AM

## 2024-08-01 NOTE — Progress Notes (Signed)
   Jul 22, 2024 1104  TOC Brief Assessment  Insurance and Status Reviewed  Patient has primary care physician Yes  Home environment has been reviewed Single family home  Prior level of function: Needs assistance  Prior/Current Home Services No current home services  Social Drivers of Health Review SDOH reviewed no interventions necessary  Readmission risk has been reviewed Yes  Transition of care needs no transition of care needs at this time   Pt screened. Pt transitioning to comfort care measures at this time. There are no IP CM needs at this time. IP CM will sign off.

## 2024-08-01 NOTE — Plan of Care (Signed)
      Brief palliative note:   Chart reviewed. Patient with history of metastatic cervical cancer with mets to lung, liver, and peritoneum. Transferred from [facility] to [facility] on 11/13 for management of loculated left pleural effusion. Discussed with Dr. Catherine. After GOC discussion this morning, family has decided to transition to comfort care.  Please call team phone [phone] if PMT can be of further assistance.    Recardo Loll, NP-C Palliative Medicine   Please call Palliative Medicine team phone with any questions [phone]. For individual providers please see AMION.   No charge

## 2024-08-01 DEATH — deceased

## 2024-08-12 LAB — FUNGAL ORGANISM REFLEX

## 2024-08-12 LAB — FUNGUS CULTURE WITH STAIN

## 2024-08-12 LAB — FUNGUS CULTURE RESULT
